# Patient Record
Sex: Male | Born: 1975
Health system: Southern US, Community
[De-identification: ages and names within clinical notes are randomized; demographics above are authoritative.]

## PROBLEM LIST (undated history)

## (undated) DIAGNOSIS — F419 Anxiety disorder, unspecified: Secondary | ICD-10-CM

## (undated) DIAGNOSIS — F4310 Post-traumatic stress disorder, unspecified: Secondary | ICD-10-CM

## (undated) DIAGNOSIS — F319 Bipolar disorder, unspecified: Secondary | ICD-10-CM

## (undated) DIAGNOSIS — F1020 Alcohol dependence, uncomplicated: Secondary | ICD-10-CM

## (undated) DIAGNOSIS — R443 Hallucinations, unspecified: Secondary | ICD-10-CM

## (undated) DIAGNOSIS — F329 Major depressive disorder, single episode, unspecified: Secondary | ICD-10-CM

## (undated) DIAGNOSIS — F32A Depression, unspecified: Secondary | ICD-10-CM

## (undated) DIAGNOSIS — F200 Paranoid schizophrenia: Secondary | ICD-10-CM

## (undated) DIAGNOSIS — T50905A Adverse effect of unspecified drugs, medicaments and biological substances, initial encounter: Secondary | ICD-10-CM

## (undated) DIAGNOSIS — R569 Unspecified convulsions: Secondary | ICD-10-CM

## (undated) HISTORY — DX: Bipolar disorder, unspecified: F31.9

## (undated) HISTORY — DX: Post-traumatic stress disorder, unspecified: F43.10

## (undated) HISTORY — DX: Hallucinations, unspecified: R44.3

---

## 2001-03-20 HISTORY — PX: KIDNEY DONATION: SHX685

## 2008-09-21 ENCOUNTER — Emergency Department (HOSPITAL_COMMUNITY): Admission: EM | Admit: 2008-09-21 | Discharge: 2008-09-21 | Payer: Self-pay | Admitting: Emergency Medicine

## 2010-02-25 MED ORDER — PENICILLIN V-K 500 MG TAB
500 mg | ORAL_TABLET | Freq: Four times a day (QID) | ORAL | Status: AC
Start: 2010-02-25 — End: 2010-03-04

## 2010-02-25 MED ORDER — HYDROCODONE-ACETAMINOPHEN 7.5 MG-325 MG TAB
ORAL | Status: AC
Start: 2010-02-25 — End: 2010-02-25
  Administered 2010-02-25: 13:00:00 via ORAL

## 2010-02-25 MED ORDER — HYDROCODONE-ACETAMINOPHEN 7.5 MG-500 MG TAB
ORAL_TABLET | ORAL | Status: DC | PRN
Start: 2010-02-25 — End: 2010-08-20

## 2010-02-25 MED FILL — HYDROCODONE-ACETAMINOPHEN 7.5 MG-325 MG TAB: ORAL | Qty: 1

## 2010-02-25 NOTE — ED Notes (Signed)
I have reviewed discharge instructions with the patient.  The patient verbalized understanding.

## 2010-02-25 NOTE — ED Provider Notes (Signed)
Patient is a 34 y.o. male presenting with dental problem. The history is provided by the patient.   Dental Pain   This is a recurrent problem. The current episode started 2 days ago. The problem occurs constantly. The problem has been gradually worsening. The pain is located in the right lower mouth.The quality of the pain is aching and sharp.  The pain is at a severity of 10/10. Associated symptoms include nausea and gum redness.There was no vomiting, no fever, no swelling, no chest pain, no shortness of breath, no headaches and no drainage. He has tried aspirin for the symptoms. The treatment provided no relief. The patient has no cardiac history.       No past medical history on file.     No past surgical history on file.      No family history on file.     History   Social History   ??? Marital Status: Married     Spouse Name: N/A     Number of Children: N/A   ??? Years of Education: N/A   Occupational History   ??? Not on file.   Social History Main Topics   ??? Smoking status: Current Everyday Smoker -- 1.5 packs/day   ??? Smokeless tobacco: Not on file   ??? Alcohol Use: No   ??? Drug Use: No   ??? Sexually Active:    Other Topics Concern   ??? Not on file   Social History Narrative   ??? No narrative on file                    ALLERGIES: Review of patient's allergies indicates no known allergies.      Review of Systems   Constitutional: Negative for fever and chills.   HENT: Positive for dental problem. Negative for sore throat, trouble swallowing, neck pain and neck stiffness.    All other systems reviewed and are negative.        Filed Vitals:    02/25/10 0805   BP: 146/108   Pulse: 85   Temp: 97.8 ??F (36.6 ??C)   Resp: 18   Height: 5\' 9"  (1.753 m)   Weight: 185 lb (83.915 kg)   SpO2: 99%              Physical Exam   Nursing note and vitals reviewed.  Constitutional: He is oriented to person, place, and time. He appears well-developed and well-nourished. He appears distressed (mild).   HENT:    Head: Normocephalic and atraumatic. No trismus in the jaw.   Right Ear: External ear normal.   Left Ear: External ear normal.   Mouth/Throat: Uvula is midline, oropharynx is clear and moist and mucous membranes are normal. He does not have dentures. No oral lesions. Abnormal dentition. Dental caries present. No dental abscesses, uvula swelling or lacerations.   Eyes: Conjunctivae and EOM are normal. Pupils are equal, round, and reactive to light.   Neck: Normal range of motion. Neck supple.   Cardiovascular: Normal rate, regular rhythm and normal heart sounds.    No murmur heard.  Pulmonary/Chest: Effort normal and breath sounds normal. No respiratory distress.   Lymphadenopathy:     He has no cervical adenopathy.   Neurological: He is alert and oriented to person, place, and time.   Skin: He is not diaphoretic.        MDM    Procedures

## 2010-02-25 NOTE — ED Notes (Signed)
Pt here with lower right sided dental abscess, states he has a dentist appt on tuesday

## 2010-08-20 MED ORDER — TRAMADOL 50 MG TAB
50 mg | ORAL_TABLET | Freq: Four times a day (QID) | ORAL | Status: DC | PRN
Start: 2010-08-20 — End: 2012-05-26

## 2010-08-20 MED ORDER — LIDOCAINE HCL 1 % (10 MG/ML) IJ SOLN
10 mg/mL (1 %) | INTRAMUSCULAR | Status: DC
Start: 2010-08-20 — End: 2010-08-20
  Administered 2010-08-20: 18:00:00 via INTRAMUSCULAR

## 2010-08-20 MED ORDER — DIPHTH,PERTUS(ACEL)TETANUS VAC(PF) 2 LF-(5-3-5MCG)-5 LF/0.5 ML IM SUSP
2 Lf-(.5-5-3-5 mcg)-5Lf/0.5 mL | INTRAMUSCULAR | Status: AC
Start: 2010-08-20 — End: 2010-08-20
  Administered 2010-08-20: 18:00:00 via INTRAMUSCULAR

## 2010-08-20 MED ORDER — LIDOCAINE 2 % MUCOSAL GEL
2 % | Status: AC
Start: 2010-08-20 — End: 2010-08-20
  Administered 2010-08-20: 19:00:00 via TOPICAL

## 2010-08-20 MED ORDER — MUPIROCIN 2 % OINTMENT
2 % | Freq: Three times a day (TID) | CUTANEOUS | Status: DC
Start: 2010-08-20 — End: 2012-05-26

## 2010-08-20 MED ORDER — TRIMETHOPRIM-SULFAMETHOXAZOLE 160 MG-800 MG TAB
160-800 mg | ORAL_TABLET | Freq: Two times a day (BID) | ORAL | Status: AC
Start: 2010-08-20 — End: 2010-08-30

## 2010-08-20 NOTE — ED Notes (Signed)
Site cleaned, dressing placed

## 2010-08-20 NOTE — ED Provider Notes (Signed)
HPI Comments: Ethan Sampson states he was working on his deck and fell off, accidentally lacerating his left elbow/arm 3 days ago. He just wrapped it up and decided today to come be seen. He noticed it was hurting more and is red and painful.    Patient is a 35 y.o. male presenting with skin laceration. The history is provided by the patient.   Laceration   Incident onset: 3 days now. The laceration is located on the left arm. The laceration is 2 cm in size. Injury mechanism: fall off his porch. The pain is at a severity of 7/10. The pain is moderate. The pain has been constant since onset. Associated symptoms include discoloration. Pertinent negatives include no numbness, no tingling, no weakness, no loss of motion and no coolness. The patient's last tetanus shot was 5 to 10 years ago.       No past medical history on file.     Past Surgical History   Procedure Date   ??? Hx urological      R nephrectomy         No family history on file.     History     Social History   ??? Marital Status: Married     Spouse Name: N/A     Number of Children: N/A   ??? Years of Education: N/A     Occupational History   ??? Not on file.     Social History Main Topics   ??? Smoking status: Current Everyday Smoker -- 1.5 packs/day   ??? Smokeless tobacco: Not on file   ??? Alcohol Use: Yes   ??? Drug Use: No   ??? Sexually Active:      Other Topics Concern   ??? Not on file     Social History Narrative   ??? No narrative on file                  ALLERGIES: Pcn      Review of Systems   Constitutional: Negative.    HENT: Negative.    Eyes: Negative.    Respiratory: Negative.    Cardiovascular: Negative.    Gastrointestinal: Negative.    Genitourinary: Negative.    Musculoskeletal: Negative.    Skin: Positive for wound.   Neurological: Negative.  Negative for tingling, weakness and numbness.   Hematological: Negative.    Psychiatric/Behavioral: Negative.    [all other systems reviewed and are negative        Filed Vitals:    08/20/10 1329   BP: 127/80   Pulse: 87    Temp: 97.9 ??F (36.6 ??C)   Resp: 16   Height: 5\' 9"  (1.753 m)   Weight: 90.719 kg (200 lb)   SpO2: 98%            Physical Exam   [nursing notereviewed.  Constitutional: He is oriented to person, place, and time. He appears well-developed and well-nourished.   HENT:   Head: Normocephalic and atraumatic.   Right Ear: External ear normal.   Left Ear: External ear normal.   Nose: Nose normal.   Mouth/Throat: Oropharynx is clear and moist.   Eyes: Conjunctivae and EOM are normal. Pupils are equal, round, and reactive to light.   Neck: Normal range of motion. Neck supple.   Cardiovascular: Normal rate, regular rhythm, normal heart sounds and intact distal pulses.    Pulmonary/Chest: Effort normal and breath sounds normal.   Abdominal: Soft. Bowel sounds are normal.   Musculoskeletal: Normal  range of motion.   Neurological: He is alert and oriented to person, place, and time. He has normal reflexes.   Skin: Skin is warm and dry. There is erythema.        Psychiatric: He has a normal mood and affect. His behavior is normal. Judgment and thought content normal.        MDM    Procedures    The patient was observed in the ED.  Adacel and Rocephin 1 gm IM here. Sterile dressing and ace wrap applied. ED if any change or worse. Medication as directed. Ethan Sampson counseled on quitting smoking. All questions answered.  I discussed the results of all labs, procedures, radiographs, and treatments with the patient and available family.  Treatment plan is agreed upon and the patient is ready for discharge.  All voiced understanding of the discharge plan and medication instructions or changes as appropriate.  Questions about treatment in the ED were answered.  All were encouraged to return should symptoms worsen or new problems develop.

## 2010-08-20 NOTE — ED Notes (Signed)
I have reviewed discharge instructions with the patient.  The patient verbalized understanding. 3 prescriptions given to pt. Pt ambulatory. Pt not in any distress.

## 2011-06-04 NOTE — ED Notes (Signed)
Patient advises that he has been having congestion and cough for app 6 days. Patient advises that he has been taking theraflu without any help. Patient also complaining of headache.

## 2011-06-05 MED ORDER — FLUTICASONE 50 MCG/ACTUATION NASAL SPRAY, SUSP
50 mcg/actuation | Freq: Every day | NASAL | Status: DC
Start: 2011-06-05 — End: 2012-05-26

## 2011-06-05 MED ORDER — CETIRIZINE 10 MG TAB
10 mg | ORAL_TABLET | Freq: Every day | ORAL | Status: AC
Start: 2011-06-05 — End: 2011-06-20

## 2011-06-05 MED ORDER — CEPHALEXIN 500 MG CAP
500 mg | ORAL_CAPSULE | Freq: Four times a day (QID) | ORAL | Status: AC
Start: 2011-06-05 — End: 2011-06-15

## 2011-06-05 NOTE — ED Provider Notes (Signed)
HPI Comments: 36 yo male c/o cough productive of sputum, sinus congestion and HA x 1.5 weeks. Patient states she has had chills and sweats. Patient denies F/N/V/D/C. Patient states he has taken theraflu without relief.       Pmhx:   Shx: 1 ppd, occ etoh     Patient is a 36 y.o. male presenting with sinus problems. The history is provided by the patient.   Sinus Infection   This is a new problem. The current episode started more than 1 week ago. The problem has not changed since onset.There has been no fever. The pain is mild. The pain has been intermittent since onset. Associated symptoms include congestion and sinus pressure. Pertinent negatives include no chills, no ear pain, no hoarse voice, no sore throat, no swollen glands, no cough, no shortness of breath, no rhinorrhea, no neck pain, no neck pain and no chest pain. Treatments tried: theraflu. The treatment provided no relief.        Past Medical History   Diagnosis Date   ??? Other ill-defined conditions      back problems        Past Surgical History   Procedure Date   ??? Hx urological      R nephrectomy   ??? Hx appendectomy    ??? Hx orthopaedic      toe amputated on right foot 2 nd digit         No family history on file.     History     Social History   ??? Marital Status: MARRIED     Spouse Name: N/A     Number of Children: N/A   ??? Years of Education: N/A     Occupational History   ??? Not on file.     Social History Main Topics   ??? Smoking status: Current Everyday Smoker -- 1.0 packs/day   ??? Smokeless tobacco: Not on file   ??? Alcohol Use: Yes   ??? Drug Use: No   ??? Sexually Active:      Other Topics Concern   ??? Not on file     Social History Narrative   ??? No narrative on file                  ALLERGIES: Pcn      Review of Systems   Constitutional: Negative for fever, chills, diaphoresis, activity change, appetite change, fatigue and unexpected weight change.   HENT: Positive for congestion and sinus pressure. Negative for hearing loss, ear pain, sore throat, hoarse  voice, facial swelling, rhinorrhea, sneezing, trouble swallowing, neck pain, neck stiffness, dental problem, postnasal drip and ear discharge.    Respiratory: Negative for apnea, cough, choking, chest tightness, shortness of breath, wheezing and stridor.    Cardiovascular: Negative for chest pain, palpitations and leg swelling.   Gastrointestinal: Negative for nausea, vomiting, diarrhea and constipation.   Skin: Negative for color change and pallor.   Psychiatric/Behavioral: Negative for behavioral problems, confusion and agitation.       Filed Vitals:    06/04/11 2326   BP: 120/73   Pulse: 76   Temp: 98.2 ??F (36.8 ??C)   Resp: 16   Height: 5\' 9"  (1.753 m)   Weight: 79.833 kg (176 lb)   SpO2: 95%            Physical Exam   Nursing note and vitals reviewed.  Constitutional: He is oriented to person, place, and time. Vital signs are normal. He  appears well-developed and well-nourished.  Non-toxic appearance. He does not have a sickly appearance. He does not appear ill. No distress.   HENT:   Head: Normocephalic and atraumatic. No trismus in the jaw.   Right Ear: Hearing, tympanic membrane, external ear and ear canal normal.   Left Ear: Hearing, tympanic membrane, external ear and ear canal normal.   Nose: Mucosal edema present. Right sinus exhibits frontal sinus tenderness. Right sinus exhibits no maxillary sinus tenderness. Left sinus exhibits frontal sinus tenderness. Left sinus exhibits no maxillary sinus tenderness.   Mouth/Throat: Uvula is midline, oropharynx is clear and moist and mucous membranes are normal. He does not have dentures. No oral lesions. Normal dentition. No dental abscesses, uvula swelling, lacerations or dental caries.   Eyes: Conjunctivae and EOM are normal. Pupils are equal, round, and reactive to light.   Neck: Normal range of motion. Neck supple.   Cardiovascular: Normal rate, regular rhythm, S1 normal, S2 normal and normal heart sounds.  Exam reveals no gallop.    No murmur heard.   Pulmonary/Chest: Effort normal and breath sounds normal. No respiratory distress. He has no decreased breath sounds. He has no wheezes. He has no rhonchi. He has no rales.   Neurological: He is alert and oriented to person, place, and time.   Skin: Skin is warm, dry and intact. No abrasion, no bruising, no burn, no ecchymosis, no laceration, no lesion and no rash noted. He is not diaphoretic. No erythema. No pallor.   Psychiatric: He has a normal mood and affect. His speech is normal and behavior is normal. Judgment and thought content normal.        MDM     Risk of Significant Complications, Morbidity, and/or Mortality:   Presenting problems:  Low  Diagnostic procedures:  Low  Management options:  Low      Procedures    I have discussed the results of labs, procedures, radiographs, treatments as well as any previous results found within the [facility]. [employer] with the patient and available family.?? A treatment plan was developed in conjunction with the patient and was agreed upon. The patient is ready for discharge at this time.?? All voiced understanding of the discharge plan and medication instructions or changes as appropriate.?? Questions about treatment in the ED were answered.?? The patient was encouraged to return should symptoms worsen or new problems develop. A follow up physician was provided to the patient on the discharge papers.

## 2011-06-05 NOTE — ED Notes (Signed)
Rx x 4  Patient is wtihout distress received  Work excuse for a day  Followup as needed ambulatory out of the ER

## 2011-06-05 NOTE — ED Notes (Signed)
Patient is complaining with sinus infections and pressure with headache

## 2011-06-05 NOTE — ED Provider Notes (Signed)
I was physically present and available for consultation during this encounter.

## 2012-05-26 NOTE — ED Notes (Signed)
I have reviewed discharge instructions with the patient.  The patient verbalized understanding.

## 2012-05-26 NOTE — ED Provider Notes (Signed)
HPI Comments: Patient is here with an abscess on his right gluteal area that started a couple of days ago. It is very painful and it is getting worse. 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     Patient is a 37 y.o. male presenting with abscess. The history is provided by the patient.   Abscess   This is a new problem. Episode onset: 3 or 4 days now. Patient reports a subjective fever - was not measured.Affected Location: right gluteal area. The pain is at a severity of 9/10. The pain is severe. Associated symptoms include pain. He has tried nothing for the symptoms.        Past Medical History   Diagnosis Date   ??? Other ill-defined conditions      back problems        Past Surgical History   Procedure Laterality Date   ??? Hx urological       R nephrectomy   ??? Hx appendectomy     ??? Hx orthopaedic       toe amputated on right foot 2 nd digit         History reviewed. No pertinent family history.     History     Social History   ??? Marital Status: MARRIED     Spouse Name: N/A     Number of Children: N/A   ??? Years of Education: N/A     Occupational History   ??? Not on file.     Social History Main Topics   ??? Smoking status: Current Every Day Smoker -- 1.00 packs/day   ??? Smokeless tobacco: Not on file   ??? Alcohol Use: Yes   ??? Drug Use: No   ??? Sexually Active: Not on file     Other Topics Concern   ??? Not on file     Social History Narrative   ??? No narrative on file                  ALLERGIES: Pcn      Review of Systems   Constitutional: Negative.    HENT: Negative.    Eyes: Negative.    Respiratory: Negative.    Cardiovascular: Negative.    Gastrointestinal: Negative.    Genitourinary: Negative.    Musculoskeletal: Negative.    Skin: Positive for color change and wound.   Neurological: Negative.    Psychiatric/Behavioral: Negative.    All other systems reviewed and are negative.        Filed Vitals:    05/26/12 2023   BP: 108/56   Pulse: 84   Temp: 99.2 ??F (37.3 ??C)   Resp: 16   Height: 5\' 9"  (1.753 m)    Weight: 83.915 kg (185 lb)   SpO2: 100%            Physical Exam   Nursing note and vitals reviewed.  Constitutional: He is oriented to person, place, and time. He appears well-developed and well-nourished.   HENT:   Head: Normocephalic and atraumatic.   Right Ear: External ear normal.   Left Ear: External ear normal.   Nose: Nose normal.   Mouth/Throat: Oropharynx is clear and moist.   Eyes: Conjunctivae and EOM are normal. Pupils are equal, round, and reactive to light.   Neck: Normal range of motion. Neck supple.   Cardiovascular: Normal rate, regular rhythm, normal heart sounds and intact distal pulses.    Pulmonary/Chest: Effort normal and breath  sounds normal.   Abdominal: Soft. Bowel sounds are normal.   Musculoskeletal: Normal range of motion.        Back:    Neurological: He is alert and oriented to person, place, and time. He has normal reflexes.   Skin: Skin is warm and dry.   Psychiatric: He has a normal mood and affect. His behavior is normal. Judgment and thought content normal.        MDM    I&D Abcess Simple  Consent: Verbal consent obtained.  Consent given by: patient  Date/Time: 05/26/2012 8:58 PM  Performed by: PAPreparation: skin prepped with Betadine  Pre-procedure re-eval: Immediately prior to the procedure, the patient was reevaluated and found suitable for the planned procedure and any planned medications.  Time out: Immediately prior to the procedure a time out was called to verify the correct patient, procedure, equipment, staff and marking as appropriate..  Location details: right buttocks  Anesthesia: local infiltration  Local anesthetic: lidocaine 1% without epinephrine  Anesthetic total: 10 ml  Scalpel size: 11  Needle gauge: 30.  Incision type: single straight  Complexity: simple  Drainage: purulent  Drainage amount: moderate  Wound treatment: wound left open  Packing material: 1/4 in iodoform gauze  Post-procedure: dressing applied  Patient tolerance: Patient tolerated the procedure  well with no immediate complications.  My total time at bedside, performing this procedure was 1-15 minutes.        The patient was observed in the ED. Patient instructed on wound care and will follow up in 2 days for packing removal.     I discussed the results of all labs, procedures, radiographs, and treatments with the patient and available family.  Treatment plan is agreed upon and the patient is ready for discharge.  All voiced understanding of the discharge plan and medication instructions or changes as appropriate.  Questions about treatment in the ED were answered.  All were encouraged to return should symptoms worsen or new problems develop.

## 2012-05-26 NOTE — ED Provider Notes (Signed)
I was personally available for consultation in the emergency department.  I have reviewed the chart and agree with the documentation recorded by the MLP, including the assessment, treatment plan, and disposition.  Jen Eppinger STUART Francely Craw, MD

## 2012-05-26 NOTE — ED Notes (Signed)
Boil on right buttocks.

## 2012-05-27 MED ADMIN — HYDROcodone-acetaminophen (NORCO) 5-325 mg per tablet 1 Tab: ORAL | @ 01:00:00 | NDC 00406036523

## 2012-05-27 MED ADMIN — ketorolac (TORADOL) tablet 10 mg: ORAL | @ 01:00:00 | NDC 00378113401

## 2012-06-02 NOTE — ED Notes (Signed)
Patient given sandwich per MD.     Hinda Lenis, RN

## 2012-06-02 NOTE — ED Notes (Signed)
FSBS 122mg /dl.

## 2012-06-02 NOTE — ED Notes (Signed)
Pt. States is in treatment center for alcohol. No alcohol x 16 days. States was drinking 1/2 gallon of vodka. In place of vodka, now eating chocolate at night.  States 'was feeling really bad, have a bad headache and realized I hadn't had any chocolate today'. Pt. Took FSBS before chocolate, 'my ears were ringing', and BS was '47'. Ate chocolate and 20 min. Later rechecked and was '64'. Then ate 4 packs of sugar, checked it 15 min. Later and was '120'.  Rechecked it an hour later and 'it was back to 54'. Pt. Also c/o boil on buttocks. Lost script for antibiotic.

## 2012-06-02 NOTE — ED Notes (Signed)
I have reviewed discharge instructions with the patient.  The patient verbalized understanding. Patient ambulatory to lobby in no acute distress.    Jack Bolio M Vaughn, RN

## 2012-06-02 NOTE — ED Provider Notes (Addendum)
HPI Comments: Checked fsbg on a whim, for fear his sugar would be high,   It was low  See RN note for rest of details  Pt worried he has D.M., as it runs in his family.  .  Also c/o right gluteal pain near recent I&D for which he did NOT get his abx filled.  No drainage      Patient is a 37 y.o. male presenting with hypoglycemia. The history is provided by the patient.   Low Blood Sugar   This is a new problem. The current episode started 3 to 5 hours ago. The problem has been rapidly worsening. Pertinent negatives include no confusion, no seizures, no unresponsiveness, no agitation, no delusions and no hallucinations. Mental status baseline is normal.  His past medical history does not include seizures.        Past Medical History   Diagnosis Date   ??? Other ill-defined conditions      back problems        Past Surgical History   Procedure Laterality Date   ??? Hx urological       R nephrectomy   ??? Hx appendectomy     ??? Hx orthopaedic       toe amputated on right foot 2 nd digit         No family history on file.     History     Social History   ??? Marital Status: MARRIED     Spouse Name: N/A     Number of Children: N/A   ??? Years of Education: N/A     Occupational History   ??? Not on file.     Social History Main Topics   ??? Smoking status: Current Every Day Smoker -- 1.00 packs/day   ??? Smokeless tobacco: Not on file   ??? Alcohol Use: Yes   ??? Drug Use: No   ??? Sexually Active: Not on file     Other Topics Concern   ??? Not on file     Social History Narrative   ??? No narrative on file                  ALLERGIES: Pcn      Review of Systems   Endocrine: Negative for polydipsia and polyphagia.   Genitourinary: Negative for dysuria and frequency.   Skin: Positive for wound. Negative for rash.   Neurological: Negative for seizures and syncope.   Psychiatric/Behavioral: Negative for hallucinations, confusion and agitation.       Filed Vitals:    06/02/12 2027 06/02/12 2241   BP: 137/68 124/70   Pulse: 80 78   Temp: 98.4 ??F (36.9  ??C) 97.9 ??F (36.6 ??C)   Resp: 18 16   Height: 5\' 9"  (1.753 m)    Weight: 88.905 kg (196 lb)    SpO2: 100% 100%            Physical Exam   Nursing note and vitals reviewed.  Constitutional: He is oriented to person, place, and time. He appears well-developed and well-nourished.   HENT:   Head: Normocephalic and atraumatic.   Eyes: Conjunctivae are normal. Pupils are equal, round, and reactive to light. Right eye exhibits no discharge. Left eye exhibits no discharge. No scleral icterus.   Neck: Normal range of motion. Neck supple.   Pulmonary/Chest: Effort normal. No respiratory distress.   Musculoskeletal: Normal range of motion. He exhibits no edema.   Neurological: He is alert and oriented to  person, place, and time. He exhibits normal muscle tone.   cni 2-12 grossly   Skin: Skin is warm and dry. He is not diaphoretic.   Right gluteus with 4cm x 5cm indurated area, no pus, or fluctuance,  Ultrasound exam reveals NO central cavity to drain   Psychiatric: He has a normal mood and affect. His behavior is normal.        MDM    Procedures

## 2012-06-03 LAB — METABOLIC PANEL, BASIC
Anion gap: 5 mmol/L — ABNORMAL LOW (ref 7–16)
BUN: 16 MG/DL (ref 6–23)
CO2: 27 mmol/L (ref 21–32)
Calcium: 9.2 MG/DL (ref 8.3–10.4)
Chloride: 102 mmol/L (ref 98–107)
Creatinine: 1.1 MG/DL (ref 0.8–1.5)
GFR est AA: >60 mL/min/{1.73_m2} (ref >60)
GFR est non-AA: >60 mL/min/{1.73_m2} (ref >60)
Glucose: 85 mg/dL (ref 65–100)
Potassium: 3.7 mmol/L (ref 3.5–5.1)
Sodium: 134 mmol/L — ABNORMAL LOW (ref 136–145)

## 2012-06-03 LAB — GLUCOSE, POC: Glucose (POC): 122 mg/dL — ABNORMAL HIGH (ref 65–100)

## 2012-06-03 MED ADMIN — trimethoprim-sulfamethoxazole (BACTRIM DS, SEPTRA DS) 160-800 mg per tablet 2 Tab: ORAL | @ 02:00:00 | NDC 68084023011

## 2012-08-01 LAB — CBC WITH AUTOMATED DIFF
ABS. BASOPHILS: 0 10*3/uL (ref 0.0–0.2)
ABS. EOSINOPHILS: 0.1 10*3/uL (ref 0.0–0.8)
ABS. IMM. GRANS.: 0 10*3/uL (ref 0.0–0.5)
ABS. LYMPHOCYTES: 2.9 10*3/uL (ref 0.5–4.6)
ABS. MONOCYTES: 1.7 10*3/uL — ABNORMAL HIGH (ref 0.1–1.3)
ABS. NEUTROPHILS: 7.4 10*3/uL (ref 1.7–8.2)
BASOPHILS: 0 % (ref 0.0–2.0)
EOSINOPHILS: 1 % (ref 0.5–7.8)
HCT: 38.4 % — ABNORMAL LOW (ref 41.1–50.3)
HGB: 13 g/dL — ABNORMAL LOW (ref 13.6–17.2)
IMMATURE GRANULOCYTES: 0.2 % (ref 0.0–5.0)
LYMPHOCYTES: 24 % (ref 13–44)
MCH: 33.7 PG — ABNORMAL HIGH (ref 26.1–32.9)
MCHC: 33.9 g/dL (ref 31.4–35.0)
MCV: 99.5 FL — ABNORMAL HIGH (ref 79.6–97.8)
MONOCYTES: 14 % — ABNORMAL HIGH (ref 4.0–12.0)
MPV: 9.6 FL — ABNORMAL LOW (ref 10.8–14.1)
NEUTROPHILS: 61 % (ref 43–78)
PLATELET: 363 10*3/uL (ref 150–450)
RBC: 3.86 M/uL — ABNORMAL LOW (ref 4.23–5.67)
RDW: 14.6 % (ref 11.9–14.6)
WBC: 12.3 10*3/uL — ABNORMAL HIGH (ref 4.3–11.1)

## 2012-08-01 LAB — EKG, 12 LEAD, INITIAL
Atrial Rate: 61 {beats}/min
Calculated P Axis: 78 degrees
Calculated R Axis: 88 degrees
Calculated T Axis: 88 degrees
P-R Interval: 124 ms
Q-T Interval: 408 ms
QRS Duration: 98 ms
QTC Calculation (Bezet): 410 ms
Ventricular Rate: 61 {beats}/min

## 2012-08-01 LAB — ETHYL ALCOHOL: ALCOHOL(ETHYL),SERUM: 5 MG/DL

## 2012-08-01 LAB — METABOLIC PANEL, BASIC
Anion gap: 7 mmol/L (ref 7–16)
BUN: 18 MG/DL (ref 6–23)
CO2: 26 mmol/L (ref 21–32)
Calcium: 8.5 MG/DL (ref 8.3–10.4)
Chloride: 102 mmol/L (ref 98–107)
Creatinine: 1.67 MG/DL — ABNORMAL HIGH (ref 0.8–1.5)
GFR est AA: >60 mL/min/{1.73_m2} (ref >60)
GFR est non-AA: 50 mL/min/{1.73_m2} — ABNORMAL LOW (ref >60)
Glucose: 136 mg/dL — ABNORMAL HIGH (ref 65–100)
Potassium: 3.5 mmol/L (ref 3.5–5.1)
Sodium: 135 mmol/L — ABNORMAL LOW (ref 136–145)

## 2012-08-01 LAB — DRUG SCREEN, URINE
AMPHETAMINES: POSITIVE
BARBITURATES: NEGATIVE
BENZODIAZEPINES: NEGATIVE
COCAINE: POSITIVE
METHADONE: NEGATIVE
OPIATES: NEGATIVE
PCP(PHENCYCLIDINE): NEGATIVE
THC (TH-CANNABINOL): POSITIVE

## 2012-08-01 LAB — URINE MICROSCOPIC
Crystals, urine: 0 /LPF
Mucus: 0 /lpf

## 2012-08-01 LAB — MAGNESIUM: Magnesium: 1.9 mg/dL (ref 1.8–2.4)

## 2012-08-01 MED ADMIN — LORazepam (ATIVAN) injection 2 mg: INTRAVENOUS | @ 06:00:00 | NDC 00641604401

## 2012-08-01 MED ADMIN — sodium chloride 0.9 % bolus infusion 1,000 mL: INTRAVENOUS | @ 06:00:00 | NDC 00409798309

## 2012-08-01 MED FILL — LORAZEPAM 2 MG/ML IJ SOLN: 2 mg/mL | INTRAMUSCULAR | Qty: 1

## 2012-08-01 NOTE — ED Notes (Signed)
Assumed care of patient at shift change. Patient history, physical, and results reviewed by myself. Independent exam performed.    Waiting pt to mentally clear.

## 2012-08-01 NOTE — ED Provider Notes (Addendum)
HPI Comments: Pt reports using multiple drugs over the past several days, including injecting Meth.    Patient is a 37 y.o. male presenting with Ingested Medication. The history is provided by the patient.   Drug Overdose  This is a new problem. The current episode started more than 2 days ago. The problem occurs constantly. The problem has not changed since onset.Pertinent negatives include no chest pain, no abdominal pain, no headaches and no shortness of breath. Nothing aggravates the symptoms. Nothing relieves the symptoms. He has tried nothing for the symptoms. The treatment provided no relief.        Past Medical History   Diagnosis Date   ??? Other ill-defined conditions      back problems        Past Surgical History   Procedure Laterality Date   ??? Hx urological       R nephrectomy   ??? Hx appendectomy     ??? Hx orthopaedic       toe amputated on right foot 2 nd digit         No family history on file.     History     Social History   ??? Marital Status: MARRIED     Spouse Name: N/A     Number of Children: N/A   ??? Years of Education: N/A     Occupational History   ??? Not on file.     Social History Main Topics   ??? Smoking status: Current Every Day Smoker -- 2.00 packs/day   ??? Smokeless tobacco: Not on file   ??? Alcohol Use: Yes      Comment: Socially   ??? Drug Use: Yes     Special: Marijuana, Methamphetamines, Cocaine, Sedatives/Hypnotics, Other   ??? Sexually Active: Not on file     Other Topics Concern   ??? Not on file     Social History Narrative   ??? No narrative on file                  ALLERGIES: Pcn      Review of Systems   Constitutional: Negative for fever and chills.   Respiratory: Negative for shortness of breath.    Cardiovascular: Negative for chest pain.   Gastrointestinal: Negative for abdominal pain.   Neurological: Negative for headaches.   Psychiatric/Behavioral: Positive for hallucinations.   All other systems reviewed and are negative.        Filed Vitals:    08/01/12 0158   BP: 135/76   Pulse: 82    Temp: 97.6 ??F (36.4 ??C)   Resp: 20   Height: 5\' 9"  (1.753 m)   Weight: 74.844 kg (165 lb)   SpO2: 100%            Physical Exam   Nursing note and vitals reviewed.  Constitutional: He is oriented to person, place, and time. He appears well-developed and well-nourished. No distress.   HENT:   Head: Normocephalic and atraumatic.   Eyes: Conjunctivae and EOM are normal. Pupils are equal, round, and reactive to light.   Neck: Normal range of motion. Neck supple.   Cardiovascular: Normal rate, regular rhythm and normal heart sounds.    Pulmonary/Chest: Effort normal and breath sounds normal.   Abdominal: Soft. Bowel sounds are normal. He exhibits no distension. There is no tenderness.   Musculoskeletal: Normal range of motion. He exhibits no edema and no tenderness.   Neurological: He is alert and oriented to person,  place, and time.   Skin: Skin is warm and dry.   Psychiatric: He has a normal mood and affect. His behavior is normal.        MDM     Amount and/or Complexity of Data Reviewed:   Clinical lab tests:  Ordered and reviewed   Independant visualization of image, tracing, or specimen:  Yes  Risk of Significant Complications, Morbidity, and/or Mortality:   Presenting problems:  Moderate  Diagnostic procedures:  Moderate  Management options:  Moderate  Progress:   Patient progress:  Stable      Procedures

## 2012-08-01 NOTE — ED Notes (Signed)
Patient presents to ED with c/c effects of a meth binge. Patient reports the binge as 5 days though states he has slept a small amount in that time. Patient states he has also been using crack cocaine, powder cocaine, marijuana and states he believes his meth was cut with GHB. Patient reports seeing "shadow people."

## 2012-08-01 NOTE — ED Notes (Signed)
Patient resting quietly no distress noted.

## 2012-08-01 NOTE — ED Notes (Signed)
I have reviewed medications, follow up provider options, and discharge instructions with the patient. The patient verbalized understanding. Copy of discharge information given to patient upon discharge. Prescription(s) given to patient. Patient discharged ambulatory in no distress.

## 2013-04-18 LAB — CBC WITH AUTOMATED DIFF
ABS. BASOPHILS: 0 10*3/uL (ref 0.0–0.2)
ABS. EOSINOPHILS: 0.1 10*3/uL (ref 0.0–0.8)
ABS. IMM. GRANS.: 0 10*3/uL (ref 0.0–0.5)
ABS. LYMPHOCYTES: 1.9 10*3/uL (ref 0.5–4.6)
ABS. MONOCYTES: 0.9 10*3/uL (ref 0.1–1.3)
ABS. NEUTROPHILS: 6.7 10*3/uL (ref 1.7–8.2)
BASOPHILS: 0 % (ref 0.0–2.0)
EOSINOPHILS: 1 % (ref 0.5–7.8)
HCT: 39.1 % — ABNORMAL LOW (ref 41.1–50.3)
HGB: 13.3 g/dL — ABNORMAL LOW (ref 13.6–17.2)
IMMATURE GRANULOCYTES: 0.2 % (ref 0.0–5.0)
LYMPHOCYTES: 19 % (ref 13–44)
MCH: 32.3 PG (ref 26.1–32.9)
MCHC: 34 g/dL (ref 31.4–35.0)
MCV: 94.9 FL (ref 79.6–97.8)
MONOCYTES: 10 % (ref 4.0–12.0)
MPV: 9.8 FL — ABNORMAL LOW (ref 10.8–14.1)
NEUTROPHILS: 70 % (ref 43–78)
PLATELET: 333 10*3/uL (ref 150–450)
RBC: 4.12 M/uL — ABNORMAL LOW (ref 4.23–5.67)
RDW: 14.2 % (ref 11.9–14.6)
WBC: 9.6 10*3/uL (ref 4.3–11.1)

## 2013-04-18 LAB — METABOLIC PANEL, COMPREHENSIVE
A-G Ratio: 0.8 — ABNORMAL LOW (ref 1.2–3.5)
ALT (SGPT): 47 U/L (ref 12–65)
AST (SGOT): 92 U/L — ABNORMAL HIGH (ref 15–37)
Albumin: 3.6 g/dL (ref 3.5–5.0)
Alk. phosphatase: 82 U/L (ref 50–136)
Anion gap: 10 mmol/L (ref 7–16)
BUN: 13 MG/DL (ref 6–23)
Bilirubin, total: 1.2 MG/DL — ABNORMAL HIGH (ref 0.2–1.1)
CO2: 24 mmol/L (ref 21–32)
Calcium: 8.8 MG/DL (ref 8.3–10.4)
Chloride: 100 mmol/L (ref 98–107)
Creatinine: 1.04 MG/DL (ref 0.8–1.5)
GFR est AA: >60 mL/min/{1.73_m2} (ref >60)
GFR est non-AA: >60 mL/min/{1.73_m2} (ref >60)
Globulin: 4.6 g/dL — ABNORMAL HIGH (ref 2.3–3.5)
Glucose: 83 mg/dL (ref 65–100)
Potassium: 3.6 mmol/L (ref 3.5–5.1)
Protein, total: 8.2 g/dL (ref 6.3–8.2)
Sodium: 134 mmol/L — ABNORMAL LOW (ref 136–145)

## 2013-04-18 LAB — DRUG SCREEN, URINE
AMPHETAMINES: POSITIVE
BARBITURATES: NEGATIVE
BENZODIAZEPINES: POSITIVE
COCAINE: POSITIVE
METHADONE: NEGATIVE
OPIATES: NEGATIVE
PCP(PHENCYCLIDINE): NEGATIVE
THC (TH-CANNABINOL): POSITIVE

## 2013-04-18 MED ORDER — LORAZEPAM 2 MG/ML IJ SOLN
2 mg/mL | INTRAMUSCULAR | Status: DC
Start: 2013-04-18 — End: 2013-04-18

## 2013-04-18 MED ORDER — SODIUM CHLORIDE 0.9% BOLUS IV
0.9 % | Freq: Once | INTRAVENOUS | Status: DC
Start: 2013-04-18 — End: 2013-04-18

## 2013-04-18 MED ORDER — LORAZEPAM 2 MG/ML IJ SOLN
2 mg/mL | INTRAMUSCULAR | Status: AC
Start: 2013-04-18 — End: 2013-04-18
  Administered 2013-04-18: 18:00:00 via INTRAMUSCULAR

## 2013-04-18 MED FILL — LORAZEPAM 2 MG/ML IJ SOLN: 2 mg/mL | INTRAMUSCULAR | Qty: 1

## 2013-04-18 NOTE — ED Notes (Signed)
Patient assessed and placed on monitor

## 2013-04-18 NOTE — ED Notes (Signed)
Patient resting in bed

## 2013-04-18 NOTE — ED Notes (Signed)
I have reviewed discharge instructions with the patient.  The patient verbalized understanding. Patient being discharged without prescriptions

## 2013-04-18 NOTE — ED Notes (Signed)
Labs sent

## 2013-04-18 NOTE — ED Notes (Signed)
Wants to detox from meth and cocaine.  Lat used meth 5 days ago and cocaine yesterday.  Reports drinks half gallon of vodka a day and has drank since yesterday.  [facility] and walked out because he didn't like the staff.
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
## 2013-05-18 ENCOUNTER — Encounter (HOSPITAL_COMMUNITY): Payer: Self-pay | Admitting: Emergency Medicine

## 2013-05-18 ENCOUNTER — Emergency Department (HOSPITAL_COMMUNITY)
Admission: EM | Admit: 2013-05-18 | Discharge: 2013-05-18 | Disposition: A | Discharge status: Home / Self Care | Payer: Self-pay | Attending: Emergency Medicine | Admitting: Emergency Medicine

## 2013-05-18 DIAGNOSIS — F1910 Other psychoactive substance abuse, uncomplicated: Secondary | ICD-10-CM | POA: Diagnosis present

## 2013-05-18 DIAGNOSIS — F10239 Alcohol dependence with withdrawal, unspecified: Secondary | ICD-10-CM

## 2013-05-18 DIAGNOSIS — F10939 Alcohol use, unspecified with withdrawal, unspecified: Secondary | ICD-10-CM

## 2013-05-18 DIAGNOSIS — F17200 Nicotine dependence, unspecified, uncomplicated: Secondary | ICD-10-CM | POA: Insufficient documentation

## 2013-05-18 DIAGNOSIS — F1010 Alcohol abuse, uncomplicated: Secondary | ICD-10-CM | POA: Diagnosis present

## 2013-05-18 DIAGNOSIS — Z880 Allergy status to penicillin: Secondary | ICD-10-CM | POA: Insufficient documentation

## 2013-05-18 DIAGNOSIS — F10229 Alcohol dependence with intoxication, unspecified: Secondary | ICD-10-CM | POA: Insufficient documentation

## 2013-05-18 DIAGNOSIS — F329 Major depressive disorder, single episode, unspecified: Secondary | ICD-10-CM | POA: Diagnosis present

## 2013-05-18 DIAGNOSIS — F32A Depression, unspecified: Secondary | ICD-10-CM

## 2013-05-18 HISTORY — DX: Anxiety disorder, unspecified: F41.9

## 2013-05-18 HISTORY — DX: Depression, unspecified: F32.A

## 2013-05-18 HISTORY — DX: Major depressive disorder, single episode, unspecified: F32.9

## 2013-05-18 LAB — RAPID URINE DRUG SCREEN, HOSP PERFORMED
AMPHETAMINES: NOT DETECTED
BARBITURATES: NOT DETECTED
Benzodiazepines: NOT DETECTED
Cocaine: POSITIVE — AB
OPIATES: NOT DETECTED
TETRAHYDROCANNABINOL: POSITIVE — AB

## 2013-05-18 LAB — CBC
HCT: 39.3 % (ref 39.0–52.0)
HEMOGLOBIN: 13.8 g/dL (ref 13.0–17.0)
MCH: 34 pg (ref 26.0–34.0)
MCHC: 35.1 g/dL (ref 30.0–36.0)
MCV: 96.8 fL (ref 78.0–100.0)
PLATELETS: 286 10*3/uL (ref 150–400)
RBC: 4.06 MIL/uL — AB (ref 4.22–5.81)
RDW: 13.7 % (ref 11.5–15.5)
WBC: 6.3 10*3/uL (ref 4.0–10.5)

## 2013-05-18 LAB — COMPREHENSIVE METABOLIC PANEL
ALT: 20 U/L (ref 0–53)
AST: 31 U/L (ref 0–37)
Albumin: 3.6 g/dL (ref 3.5–5.2)
Alkaline Phosphatase: 75 U/L (ref 39–117)
BUN: 9 mg/dL (ref 6–23)
CALCIUM: 8.8 mg/dL (ref 8.4–10.5)
CHLORIDE: 101 meq/L (ref 96–112)
CO2: 24 meq/L (ref 19–32)
Creatinine, Ser: 0.89 mg/dL (ref 0.50–1.35)
GFR calc Af Amer: >90 mL/min (ref >90)
GLUCOSE: 98 mg/dL (ref 70–99)
Potassium: 4.3 mEq/L (ref 3.7–5.3)
SODIUM: 140 meq/L (ref 137–147)
Total Bilirubin: <0.2 mg/dL — ABNORMAL LOW (ref 0.3–1.2)
Total Protein: 7.7 g/dL (ref 6.0–8.3)

## 2013-05-18 LAB — CBG MONITORING, ED: GLUCOSE-CAPILLARY: 98 mg/dL (ref 70–99)

## 2013-05-18 LAB — ETHANOL: Alcohol, Ethyl (B): 139 mg/dL — ABNORMAL HIGH (ref 0–11)

## 2013-05-18 LAB — SALICYLATE LEVEL: Salicylate Lvl: <2 mg/dL — ABNORMAL LOW (ref 2.8–20.0)

## 2013-05-18 LAB — ACETAMINOPHEN LEVEL: Acetaminophen (Tylenol), Serum: <15 ug/mL (ref 10–30)

## 2013-05-18 MED ORDER — CHLORDIAZEPOXIDE HCL 25 MG PO CAPS
25.0000 mg | ORAL_CAPSULE | Freq: Four times a day (QID) | ORAL | Status: DC
  Administered 2013-05-18: 25 mg via ORAL
  Filled 2013-05-18: qty 1

## 2013-05-18 MED ORDER — LORAZEPAM 1 MG PO TABS
0.0000 mg | ORAL_TABLET | Freq: Four times a day (QID) | ORAL | Status: DC
  Administered 2013-05-18: 2 mg via ORAL
  Filled 2013-05-18: qty 2

## 2013-05-18 MED ORDER — LOPERAMIDE HCL 2 MG PO CAPS: 2.0000 mg | ORAL_CAPSULE | ORAL | Status: DC | PRN

## 2013-05-18 MED ORDER — THIAMINE HCL 100 MG/ML IJ SOLN: 100.0000 mg | Freq: Every day | INTRAMUSCULAR | Status: DC

## 2013-05-18 MED ORDER — LORAZEPAM 1 MG PO TABS: 0.0000 mg | ORAL_TABLET | Freq: Two times a day (BID) | ORAL | Status: DC

## 2013-05-18 MED ORDER — CHLORDIAZEPOXIDE HCL 25 MG PO CAPS
25.0000 mg | ORAL_CAPSULE | Freq: Once | ORAL | Status: AC
  Administered 2013-05-18: 25 mg via ORAL
  Filled 2013-05-18: qty 1

## 2013-05-18 MED ORDER — ADULT MULTIVITAMIN W/MINERALS CH
1.0000 | ORAL_TABLET | Freq: Every day | ORAL | Status: DC
  Administered 2013-05-18: 1 via ORAL
  Filled 2013-05-18: qty 1

## 2013-05-18 MED ORDER — CHLORDIAZEPOXIDE HCL 25 MG PO CAPS: 25.0000 mg | ORAL_CAPSULE | Freq: Four times a day (QID) | ORAL | Status: DC | PRN

## 2013-05-18 MED ORDER — ONDANSETRON HCL 4 MG PO TABS
4.0000 mg | ORAL_TABLET | Freq: Three times a day (TID) | ORAL | Status: DC | PRN
  Administered 2013-05-18: 4 mg via ORAL
  Filled 2013-05-18: qty 1

## 2013-05-18 MED ORDER — CHLORDIAZEPOXIDE HCL 25 MG PO CAPS: 25.0000 mg | ORAL_CAPSULE | Freq: Three times a day (TID) | ORAL | Status: DC

## 2013-05-18 MED ORDER — CHLORDIAZEPOXIDE HCL 25 MG PO CAPS: 25.0000 mg | ORAL_CAPSULE | ORAL | Status: DC

## 2013-05-18 MED ORDER — ONDANSETRON 4 MG PO TBDP: 4.0000 mg | ORAL_TABLET | Freq: Four times a day (QID) | ORAL | Status: DC | PRN

## 2013-05-18 MED ORDER — ZOLPIDEM TARTRATE 5 MG PO TABS: 5.0000 mg | ORAL_TABLET | Freq: Every evening | ORAL | Status: DC | PRN

## 2013-05-18 MED ORDER — LORAZEPAM 1 MG PO TABS
2.0000 mg | ORAL_TABLET | Freq: Once | ORAL | Status: AC
  Administered 2013-05-18: 2 mg via ORAL
  Filled 2013-05-18: qty 2

## 2013-05-18 MED ORDER — CHLORDIAZEPOXIDE HCL 25 MG PO CAPS: 25.0000 mg | ORAL_CAPSULE | Freq: Every day | ORAL | Status: DC

## 2013-05-18 MED ORDER — VITAMIN B-1 100 MG PO TABS
100.0000 mg | ORAL_TABLET | Freq: Every day | ORAL | Status: DC
  Administered 2013-05-18: 100 mg via ORAL
  Filled 2013-05-18: qty 1

## 2013-05-18 MED ORDER — NICOTINE 21 MG/24HR TD PT24
21.0000 mg | MEDICATED_PATCH | Freq: Every day | TRANSDERMAL | Status: DC
  Administered 2013-05-18 (×2): 21 mg via TRANSDERMAL
  Filled 2013-05-18 (×2): qty 1

## 2013-05-18 MED ORDER — ONDANSETRON 8 MG PO TBDP
8.0000 mg | ORAL_TABLET | Freq: Once | ORAL | Status: AC
  Administered 2013-05-18: 8 mg via ORAL
  Filled 2013-05-18: qty 1

## 2013-05-18 MED ORDER — ALUM & MAG HYDROXIDE-SIMETH 200-200-20 MG/5ML PO SUSP: 30.0000 mL | ORAL | Status: DC | PRN

## 2013-05-18 MED ORDER — IBUPROFEN 200 MG PO TABS: 600.0000 mg | ORAL_TABLET | Freq: Three times a day (TID) | ORAL | Status: DC | PRN

## 2013-05-18 NOTE — ED Notes (Addendum)
Pt arrived to unit; no s/s of distress noted at this time. Pt states he spent time yesterday with his 38yo son for the first time in ten years and was not able to stop drinking during the whole visit. Pt states he was embarrassed and he also had no money to buy his son a meal. Pt states he has no money because he cannot stop drinking long enough to hold down a job. Pt denies SI/HI or A/V hallucinations. Pt with a pleasant affect and is cooperative. Pt requesting long-term treatment for detox.

## 2013-05-18 NOTE — ED Notes (Signed)
[facility] notified of transportation needed to [facility] in [place]. 

## 2013-05-18 NOTE — Progress Notes (Signed)
Placed follow-up calls to both [facility] and [facility]:  [facility]- spoke with Florentina AddisonKatie who stated she did not have referral and transferred this Clinical research associatewriter to [place], there was no answer.  Message was left awaiting call back. [facility]- per Joni ReiningNicole need an updated ETOH level before considering.  Will call [facility] ED with update.   Tomi BambergerMariya Timika Muench Disposition [facility]

## 2013-05-18 NOTE — ED Notes (Signed)
Report called to RidgelandNicole Minor at [facility] in [place]. Will notify [facility] of transportation needed.

## 2013-05-18 NOTE — ED Provider Notes (Signed)
CSN: [id number]     Arrival date & time 05/18/13  0105 History   First MD Initiated Contact with Patient 05/18/13 0109     Chief Complaint  Patient presents with  . Medical Clearance  . Alcohol Problem   HPI  History provided by the patient. Patient is a 38 year old male with history of anxiety, depression and alcohol abuse who presents with requests for help with alcohol detox. Patient states he has been trying to   detox from alcohol himself last 2 weeks but has not had any success. Patient normally drinks half a gallon of Vodka as well as 4 Loco every day. He has been trying to reduce the amount that he drinks but states that he begins to feel tremors and has dry heaving with nausea symptoms. His last drink today was up 4 Loco at 3 PM. Since that time he has had worsening tremors and nausea symptoms. He has not had any vomiting. Denies any lightheadedness or loss of consciousness. Denies any associated SI or HI. Denies any other drug use.     Past Medical History  Diagnosis Date  . Anxiety   . Depression    Past Surgical History  Procedure Laterality Date  . Kidney donation Right 2003   History reviewed. No pertinent family history. History  Substance Use Topics  . Smoking status: Current Every Day Smoker -- 2.00 packs/day    Types: Cigarettes  . Smokeless tobacco: Never Used  . Alcohol Use: 19.2 oz/week    20 Shots of liquor, 12 Cans of beer per week    Review of Systems  All other systems reviewed and are negative.      Allergies  Penicillins  Home Medications  No current outpatient prescriptions on file. BP 118/65  Pulse 82  Temp(Src) 98.4 F (36.9 C) (Oral)  Resp 16  Ht 5\' 9"  (1.753 m)  Wt 185 lb (83.915 kg)  BMI 27.31 kg/m2  SpO2 97% Physical Exam  Nursing note and vitals reviewed. Constitutional: He is oriented to person, place, and time. He appears well-developed and well-nourished. No distress.  HENT:  Head: Normocephalic and atraumatic.  Eyes:  Conjunctivae and EOM are normal. Pupils are equal, round, and reactive to light.  Right strabismus present  Cardiovascular: Normal rate and regular rhythm.   Pulmonary/Chest: Effort normal and breath sounds normal. No respiratory distress. He has no wheezes. He has no rales.  Abdominal: Soft. There is no tenderness. There is no rebound and no guarding.  Musculoskeletal: Normal range of motion.  Neurological: He is alert and oriented to person, place, and time. He displays tremor. No cranial nerve deficit or sensory deficit.  Skin: Skin is warm. No rash noted.  Psychiatric: He has a normal mood and affect. His behavior is normal.    ED Course  Procedures   DIAGNOSTIC STUDIES: Oxygen Saturation is 97% on room air.    COORDINATION OF CARE:  Nursing notes reviewed. Vital signs reviewed. Initial pt interview and examination performed.   1:43 AM-patient seen and evaluated. He appears well no acute distress. Does have slight trembling to the extremities. Discussed work up plan with pt at bedside, which includes medical clearance and TTS consult. Pt agrees with plan.  Psychiatric holding orders as well as CIWA protocol in place. TTS consult placed.  Results for orders placed during the hospital encounter of 05/18/13  ACETAMINOPHEN LEVEL      Result Value Ref Range   Acetaminophen (Tylenol), Serum <15.0  10 - 30 ug/mL  CBC      Result Value Ref Range   WBC 6.3  4.0 - 10.5 K/uL   RBC 4.06 (*) 4.22 - 5.81 MIL/uL   Hemoglobin 13.8  13.0 - 17.0 g/dL   HCT 16.1  09.6 - 04.5 %   MCV 96.8  78.0 - 100.0 fL   MCH 34.0  26.0 - 34.0 pg   MCHC 35.1  30.0 - 36.0 g/dL   RDW 40.9  81.1 - 91.4 %   Platelets 286  150 - 400 K/uL  COMPREHENSIVE METABOLIC PANEL      Result Value Ref Range   Sodium 140  137 - 147 mEq/L   Potassium 4.3  3.7 - 5.3 mEq/L   Chloride 101  96 - 112 mEq/L   CO2 24  19 - 32 mEq/L   Glucose, Bld 98  70 - 99 mg/dL   BUN 9  6 - 23 mg/dL   Creatinine, Ser 7.82  0.50 - 1.35  mg/dL   Calcium 8.8  8.4 - 95.6 mg/dL   Total Protein 7.7  6.0 - 8.3 g/dL   Albumin 3.6  3.5 - 5.2 g/dL   AST 31  0 - 37 U/L   ALT 20  0 - 53 U/L   Alkaline Phosphatase 75  39 - 117 U/L   Total Bilirubin <0.2 (*) 0.3 - 1.2 mg/dL   GFR calc non Af Amer >90  >90 mL/min   GFR calc Af Amer >90  >90 mL/min  ETHANOL      Result Value Ref Range   Alcohol, Ethyl (B) 139 (*) 0 - 11 mg/dL  SALICYLATE LEVEL      Result Value Ref Range   Salicylate Lvl <2.0 (*) 2.8 - 20.0 mg/dL  URINE RAPID DRUG SCREEN (HOSP PERFORMED)      Result Value Ref Range   Opiates NONE DETECTED  NONE DETECTED   Cocaine POSITIVE (*) NONE DETECTED   Benzodiazepines NONE DETECTED  NONE DETECTED   Amphetamines NONE DETECTED  NONE DETECTED   Tetrahydrocannabinol POSITIVE (*) NONE DETECTED   Barbiturates NONE DETECTED  NONE DETECTED  CBG MONITORING, ED      Result Value Ref Range   Glucose-Capillary 98  70 - 99 mg/dL      MDM   Final diagnoses:  Alcohol abuse  Alcohol withdrawal       Angus Seller, PA-C 05/18/13 0533
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
## 2013-05-18 NOTE — Progress Notes (Signed)
Pt accepted to [facility] by Joni ReiningNicole and [facility].
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
## 2013-05-18 NOTE — BH Assessment (Signed)
Assessment complete. Per Rosey BathKelly Southard, [facility], dual diagnosis unit is at capacity. Consulted with Maryjean Mornharles Kober, PA who agrees Pt meets criteria for alcohol detox. TTS will contact [facility], [facility] and other treatment centers for bed availability. Notified Ivonne AndrewPeter Dammen, PA-C of recommendation.  Harlin RainFord Ellis Ria CommentWarrick Jr, LPC, [facility] Triage Specialist

## 2013-05-18 NOTE — BH Assessment (Signed)
Received call for assessment. Spoke with Ivonne AndrewPeter Dammen, PA-C who says Pt is a heavy drinker and requesting alcohol detox. Pt appears to be experiencing alcohol withdrawal. Tele-assessment will be initiated.  Harlin RainFord Ellis Ria CommentWarrick Jr, LPC, [facility] Triage Specialist

## 2013-05-18 NOTE — ED Notes (Signed)
TTS being completed at this time. ?

## 2013-05-18 NOTE — ED Notes (Signed)
Pt reports he wants to detox from ETOH, pt states he usually drinks a half gallon of liquor a day for the past 2 years, last drink yesterday, pt drank a [employer] [place] at 1400 yesterday. Pt reports he has been having tremors, n/v, and anxiety and agitiation. Pt denies HI/SI/AVH. States he has depression and anxiety. Pt a&o x4, ambulatory to triage.

## 2013-05-18 NOTE — Progress Notes (Signed)
MHT initiated placement for detox by faxing referrals to [facility] and [facility].  Blain PaisMichelle L Ranson Belluomini, MHT/NS

## 2013-05-19 NOTE — ED Provider Notes (Signed)
Medical screening examination/treatment/procedure(s) were performed by non-physician practitioner and as supervising physician I was immediately available for consultation/collaboration.   EKG Interpretation None        Ileana Chalupa, MD 05/19/13 0702 

## 2014-06-25 ENCOUNTER — Encounter: Payer: Self-pay | Admitting: Internal Medicine

## 2014-06-25 ENCOUNTER — Ambulatory Visit: Payer: Self-pay | Attending: Internal Medicine | Admitting: Internal Medicine

## 2014-06-25 VITALS — BP 116/76 | HR 63 | Temp 98.0°F | Resp 16 | Wt 193.8 lb

## 2014-06-25 DIAGNOSIS — F17210 Nicotine dependence, cigarettes, uncomplicated: Secondary | ICD-10-CM | POA: Insufficient documentation

## 2014-06-25 DIAGNOSIS — F1011 Alcohol abuse, in remission: Secondary | ICD-10-CM

## 2014-06-25 DIAGNOSIS — F418 Other specified anxiety disorders: Secondary | ICD-10-CM

## 2014-06-25 DIAGNOSIS — F319 Bipolar disorder, unspecified: Secondary | ICD-10-CM | POA: Insufficient documentation

## 2014-06-25 DIAGNOSIS — F32A Depression, unspecified: Secondary | ICD-10-CM | POA: Insufficient documentation

## 2014-06-25 DIAGNOSIS — F1911 Other psychoactive substance abuse, in remission: Secondary | ICD-10-CM

## 2014-06-25 DIAGNOSIS — F419 Anxiety disorder, unspecified: Secondary | ICD-10-CM | POA: Insufficient documentation

## 2014-06-25 DIAGNOSIS — F329 Major depressive disorder, single episode, unspecified: Secondary | ICD-10-CM

## 2014-06-25 DIAGNOSIS — Z139 Encounter for screening, unspecified: Secondary | ICD-10-CM

## 2014-06-25 DIAGNOSIS — F1010 Alcohol abuse, uncomplicated: Secondary | ICD-10-CM

## 2014-06-25 DIAGNOSIS — F17200 Nicotine dependence, unspecified, uncomplicated: Secondary | ICD-10-CM | POA: Insufficient documentation

## 2014-06-25 DIAGNOSIS — Z833 Family history of diabetes mellitus: Secondary | ICD-10-CM | POA: Insufficient documentation

## 2014-06-25 DIAGNOSIS — Z8349 Family history of other endocrine, nutritional and metabolic diseases: Secondary | ICD-10-CM

## 2014-06-25 DIAGNOSIS — Z87898 Personal history of other specified conditions: Secondary | ICD-10-CM

## 2014-06-25 DIAGNOSIS — F4310 Post-traumatic stress disorder, unspecified: Secondary | ICD-10-CM | POA: Insufficient documentation

## 2014-06-25 DIAGNOSIS — Z720 Tobacco use: Secondary | ICD-10-CM

## 2014-06-25 LAB — CBC WITH DIFFERENTIAL/PLATELET
BASOS ABS: 0.1 10*3/uL (ref 0.0–0.1)
Basophils Relative: 1 % (ref 0–1)
EOS ABS: 0.5 10*3/uL (ref 0.0–0.7)
EOS PCT: 6 % — AB (ref 0–5)
HCT: 43.1 % (ref 39.0–52.0)
Hemoglobin: 14.6 g/dL (ref 13.0–17.0)
Lymphocytes Relative: 39 % (ref 12–46)
Lymphs Abs: 3.4 10*3/uL (ref 0.7–4.0)
MCH: 32.7 pg (ref 26.0–34.0)
MCHC: 33.9 g/dL (ref 30.0–36.0)
MCV: 96.4 fL (ref 78.0–100.0)
MONO ABS: 1.3 10*3/uL — AB (ref 0.1–1.0)
MPV: 10.1 fL (ref 8.6–12.4)
Monocytes Relative: 15 % — ABNORMAL HIGH (ref 3–12)
Neutro Abs: 3.4 10*3/uL (ref 1.7–7.7)
Neutrophils Relative %: 39 % — ABNORMAL LOW (ref 43–77)
PLATELETS: 332 10*3/uL (ref 150–400)
RBC: 4.47 MIL/uL (ref 4.22–5.81)
RDW: 12.7 % (ref 11.5–15.5)
WBC: 8.8 10*3/uL (ref 4.0–10.5)

## 2014-06-25 LAB — COMPLETE METABOLIC PANEL WITH GFR
ALT: 28 U/L (ref 0–53)
AST: 20 U/L (ref 0–37)
Albumin: 4.1 g/dL (ref 3.5–5.2)
Alkaline Phosphatase: 68 U/L (ref 39–117)
BILIRUBIN TOTAL: 0.2 mg/dL (ref 0.2–1.2)
BUN: 11 mg/dL (ref 6–23)
CO2: 25 meq/L (ref 19–32)
CREATININE: 0.88 mg/dL (ref 0.50–1.35)
Calcium: 9.7 mg/dL (ref 8.4–10.5)
Chloride: 101 mEq/L (ref 96–112)
GFR, Est African American: >89 mL/min
GFR, Est Non African American: >89 mL/min
Glucose, Bld: 84 mg/dL (ref 70–99)
Potassium: 5.3 mEq/L (ref 3.5–5.3)
Sodium: 136 mEq/L (ref 135–145)
TOTAL PROTEIN: 7.5 g/dL (ref 6.0–8.3)

## 2014-06-25 LAB — TSH: TSH: 1.503 u[IU]/mL (ref 0.350–4.500)

## 2014-06-25 MED ORDER — BUPROPION HCL ER (SR) 100 MG PO TB12: 100.0000 mg | ORAL_TABLET | Freq: Two times a day (BID) | ORAL | Status: DC

## 2014-06-25 NOTE — Progress Notes (Signed)
Patient Demographics  Jerry [name], is a 39 y.o. male  JXB:[id number]  ZHY:[id number]  DOB - [birth year]  CC:  Chief Complaint  Patient presents with  . Establish Care       HPI: Jerry [name] is a 40 y.o. male here today to establish medical care.patient has history of anxiety/depression, history of alcohol abuse, history of IV drug abuse, as per patient more than a month ago he was hospitalized in [place] at that time he was treated for pneumonia, he recently moved her to [place] and has seen another psychiatrist in [place] as per patient he was prescribed Seroquel but is not taking as per patient it makes him zombie ,patient is requesting referral to see a different psychiatrist currently denies any SI or HI, he also smokes cigarettes, have counseled patient to quit smoking, he's willing to try Wellbutrin, Patient has No headache, No chest pain, No abdominal pain - No Nausea, No new weakness tingling or numbness, No Cough - SOB.  Allergies  Allergen Reactions  . Penicillins Other (See Comments)    Unknown reaction as a child   Past Medical History  Diagnosis Date  . Anxiety   . Depression   . PTSD (post-traumatic stress disorder)   . Bipolar 1 disorder   . Hallucination    No current outpatient prescriptions on file prior to visit.   No current facility-administered medications on file prior to visit.   Family History  Problem Relation Age of Onset  . Heart disease Mother   . Cancer Mother   . Heart disease Father   . Diabetes Father   . Diabetes Maternal Grandmother   . Stroke Paternal Grandfather    History   Social History  . Marital Status: Single    Spouse Name: N/A  . Number of Children: N/A  . Years of Education: N/A   Occupational History  . Not on file.   Social History Main Topics  . Smoking status: Current Every Day Smoker -- 2.00 packs/day    Types: Cigarettes  . Smokeless tobacco: Never Used  . Alcohol Use: No     Comment:  last use  was 1 month ago   . Drug Use: Yes    Special: Marijuana, Cocaine, Heroin     Comment: history of IVDA  . Sexual Activity: Yes   Other Topics Concern  . Not on file   Social History Narrative    Review of Systems: Constitutional: Negative for fever, chills, diaphoresis, activity change, appetite change and fatigue. HENT: Negative for ear pain, nosebleeds, congestion, facial swelling, rhinorrhea, neck pain, neck stiffness and ear discharge.  Eyes: Negative for pain, discharge, redness, itching and visual disturbance. Respiratory: Negative for cough, choking, chest tightness, shortness of breath, wheezing and stridor.  Cardiovascular: Negative for chest pain, palpitations and leg swelling. Gastrointestinal: Negative for abdominal distention. Genitourinary: Negative for dysuria, urgency, frequency, hematuria, flank pain, decreased urine volume, difficulty urinating and dyspareunia.  Musculoskeletal: Negative for back pain, joint swelling, arthralgia and gait problem. Neurological: Negative for dizziness, tremors, seizures, syncope, facial asymmetry, speech difficulty, weakness, light-headedness, numbness and headaches.  Hematological: Negative for adenopathy. Does not bruise/bleed easily. Psychiatric/Behavioral: Negative for hallucinations, behavioral problems, confusion, dysphoric mood, decreased concentration and agitation.    Objective:   Filed Vitals:   06/25/14 1503  BP: 116/76  Pulse: 63  Temp: 98 F (36.7 C)  Resp: 16    Physical Exam: Constitutional: Patient appears well-developed and well-nourished. No distress. HENT: Normocephalic, atraumatic, External  right and left ear normal. Oropharynx is clear and moist.  Eyes: Conjunctivae and EOM are normal. PERRLA, no scleral icterus. Neck: Normal ROM. Neck supple. No JVD. No tracheal deviation. No thyromegaly. CVS: RRR, S1/S2 +, no murmurs, no gallops, no carotid bruit.  Pulmonary: Effort and breath sounds normal, no stridor,  rhonchi, wheezes, rales.  Abdominal: Soft. BS +, no distension, tenderness, rebound or guarding.  Musculoskeletal: Normal range of motion. No edema and no tenderness.  Neuro: Alert. Normal reflexes, muscle tone coordination. No cranial nerve deficit. Skin: Skin is warm and dry. No rash noted. Not diaphoretic. No erythema. No pallor. Psychiatric: Normal mood and affect. Behavior, judgment, thought content normal.  Lab Results  Component Value Date   WBC 6.3 05/18/2013   HGB 13.8 05/18/2013   HCT 39.3 05/18/2013   MCV 96.8 05/18/2013   PLT 286 05/18/2013   Lab Results  Component Value Date   CREATININE 0.89 05/18/2013   BUN 9 05/18/2013   NA 140 05/18/2013   K 4.3 05/18/2013   CL 101 05/18/2013   CO2 24 05/18/2013    No results found for: HGBA1C Lipid Panel  No results found for: CHOL, TRIG, HDL, CHOLHDL, VLDL, LDLCALC     Assessment and plan:   1. History of alcohol abuse As per patient now he is sober for more than a month  2. History of substance abuse/IV drug abuse  - Hepatitis B surface antibody; Future - Hepatitis B surface antigen; Future - Hepatitis C RNA quantitative - HIV antibody (with reflex)  3. Anxiety and depression Patient will try Wellbutrin which will also help him to quit smoking.  - buPROPion (WELLBUTRIN SR) 100 MG 12 hr tablet; Take 1 tablet (100 mg total) by mouth 2 (two) times daily.  Dispense: 60 tablet; Refill: 3 - Ambulatory referral to Psychiatry  4. Family history of diabetes mellitus (DM)  - Hemoglobin A1c  5. Family history of thyroid disease  - TSH  6. Tobacco abuse  - buPROPion (WELLBUTRIN SR) 100 MG 12 hr tablet; Take 1 tablet (100 mg total) by mouth 2 (two) times daily.  Dispense: 60 tablet; Refill: 3  7. Screening Ordered baseline blood work  - CBC with Differential/Platelet - COMPLETE METABOLIC PANEL WITH GFR - TSH - Vit D  25 hydroxy (rtn osteoporosis monitoring) - Hemoglobin A1c   Return in about 3 months  (around 09/24/2014), or if symptoms worsen or fail to improve.    The patient was given clear instructions to go to ER or return to medical center if symptoms don't improve, worsen or new problems develop. The patient verbalized understanding. The patient was told to call to get lab results if they haven't heard anything in the next week.    This note has been created with Education officer, environmentalDragon speech recognition software and smart phrase technology. Any transcriptional errors are unintentional.   Doris CheadleADVANI, Carvell Hoeffner, MD

## 2014-06-25 NOTE — Progress Notes (Signed)
Patient here to establish care Patient states he was an alcoholic for fifteen years Patient used to drink a half gallon of vodka a day Patient went through a rough divorce Patient states he suffers from depression, anxiety,bi polar ptsd Hallucinations. Patient did state he had suicidal thoughts in the past  But it has been a while since that happened Patient has been sober for a little over a month Patient was prescribe seroquel but not taking it due to his increased liver enzymes Patient needs something for his "shakes"

## 2014-06-26 ENCOUNTER — Telehealth: Payer: Self-pay | Admitting: *Deleted

## 2014-06-26 ENCOUNTER — Telehealth: Payer: Self-pay

## 2014-06-26 LAB — HEPATITIS C RNA QUANTITATIVE: HCV Quantitative: NOT DETECTED IU/mL (ref <15)

## 2014-06-26 LAB — HEMOGLOBIN A1C
Hgb A1c MFr Bld: 5.4 % (ref <5.7)
Mean Plasma Glucose: 108 mg/dL (ref <117)

## 2014-06-26 LAB — HIV ANTIBODY (ROUTINE TESTING W REFLEX): HIV 1&2 Ab, 4th Generation: NONREACTIVE

## 2014-06-26 LAB — VITAMIN D 25 HYDROXY (VIT D DEFICIENCY, FRACTURES): Vit D, 25-Hydroxy: 21 ng/mL — ABNORMAL LOW (ref 30–100)

## 2014-06-26 MED ORDER — VITAMIN D (ERGOCALCIFEROL) 1.25 MG (50000 UNIT) PO CAPS: 50000.0000 [IU] | ORAL_CAPSULE | ORAL | Status: DC

## 2014-06-26 NOTE — Telephone Encounter (Signed)
Gave patient results of labs and instructions on Vitamin D Rx.  Patient wrote down instructions and had no questions

## 2014-06-26 NOTE — Telephone Encounter (Signed)
-----   Message from Doris Cheadleeepak Advani, MD sent at 06/26/2014 11:11 AM EDT ----- Blood work reviewed, noticed low vitamin D, call patient advise to start ergocalciferol 50,000 units once a week for the duration of  12 weeks, then take OTC vitamin d 2000 units daily.

## 2014-06-26 NOTE — Telephone Encounter (Signed)
Patient not available Left message with his mom to have him return our call

## 2014-06-29 ENCOUNTER — Telehealth: Payer: Self-pay

## 2014-06-29 NOTE — Telephone Encounter (Signed)
Called patient at [phone](number supplied from his mom) Patient not available

## 2014-06-29 NOTE — Telephone Encounter (Signed)
-----   Message from Doris Cheadleeepak Advani, MD sent at 06/29/2014  9:24 AM EDT ----- Call and let the patient know that his blood test for hepatitis C is negative.

## 2014-06-29 NOTE — Telephone Encounter (Signed)
Patient is returning nurse's phone call, please f/u °

## 2014-06-29 NOTE — Telephone Encounter (Signed)
Pt returning call for nurse

## 2014-07-13 ENCOUNTER — Telehealth: Payer: Self-pay | Admitting: Clinical

## 2014-07-13 NOTE — Telephone Encounter (Signed)
First attempt to contact pt about referral

## 2014-07-14 ENCOUNTER — Telehealth: Payer: Self-pay | Admitting: Clinical

## 2014-07-14 NOTE — Telephone Encounter (Signed)
Pt will call appointment line and make [facility]

## 2019-10-06 ENCOUNTER — Encounter (HOSPITAL_COMMUNITY): Payer: Self-pay | Admitting: Emergency Medicine

## 2019-10-06 ENCOUNTER — Emergency Department (HOSPITAL_COMMUNITY): Payer: Self-pay

## 2019-10-06 ENCOUNTER — Other Ambulatory Visit: Payer: Self-pay

## 2019-10-06 ENCOUNTER — Emergency Department (HOSPITAL_COMMUNITY)
Admission: EM | Admit: 2019-10-06 | Discharge: 2019-10-06 | Disposition: A | Discharge status: Home / Self Care | Payer: Self-pay | Attending: Emergency Medicine | Admitting: Emergency Medicine

## 2019-10-06 DIAGNOSIS — F17210 Nicotine dependence, cigarettes, uncomplicated: Secondary | ICD-10-CM | POA: Insufficient documentation

## 2019-10-06 DIAGNOSIS — R059 Cough, unspecified: Secondary | ICD-10-CM

## 2019-10-06 DIAGNOSIS — F419 Anxiety disorder, unspecified: Secondary | ICD-10-CM | POA: Insufficient documentation

## 2019-10-06 DIAGNOSIS — Z20822 Contact with and (suspected) exposure to covid-19: Secondary | ICD-10-CM | POA: Insufficient documentation

## 2019-10-06 DIAGNOSIS — B349 Viral infection, unspecified: Secondary | ICD-10-CM | POA: Insufficient documentation

## 2019-10-06 LAB — CBC
HCT: 41.2 % (ref 39.0–52.0)
Hemoglobin: 13.6 g/dL (ref 13.0–17.0)
MCH: 31.9 pg (ref 26.0–34.0)
MCHC: 33 g/dL (ref 30.0–36.0)
MCV: 96.7 fL (ref 80.0–100.0)
Platelets: 237 10*3/uL (ref 150–400)
RBC: 4.26 MIL/uL (ref 4.22–5.81)
RDW: 12.9 % (ref 11.5–15.5)
WBC: 8 10*3/uL (ref 4.0–10.5)
nRBC: 0 % (ref 0.0–0.2)

## 2019-10-06 LAB — SARS CORONAVIRUS 2 BY RT PCR (HOSPITAL ORDER, PERFORMED IN ~~LOC~~ HOSPITAL LAB): SARS Coronavirus 2: NEGATIVE

## 2019-10-06 LAB — BASIC METABOLIC PANEL
Anion gap: 10 (ref 5–15)
BUN: 5 mg/dL — ABNORMAL LOW (ref 6–20)
CO2: 20 mmol/L — ABNORMAL LOW (ref 22–32)
Calcium: 9.1 mg/dL (ref 8.9–10.3)
Chloride: 104 mmol/L (ref 98–111)
Creatinine, Ser: 0.76 mg/dL (ref 0.61–1.24)
GFR calc Af Amer: >60 mL/min (ref >60)
GFR calc non Af Amer: >60 mL/min (ref >60)
Glucose, Bld: 100 mg/dL — ABNORMAL HIGH (ref 70–99)
Potassium: 4.1 mmol/L (ref 3.5–5.1)
Sodium: 134 mmol/L — ABNORMAL LOW (ref 135–145)

## 2019-10-06 LAB — URINALYSIS, ROUTINE W REFLEX MICROSCOPIC
Bilirubin Urine: NEGATIVE
Glucose, UA: NEGATIVE mg/dL
Hgb urine dipstick: NEGATIVE
Ketones, ur: NEGATIVE mg/dL
Leukocytes,Ua: NEGATIVE
Nitrite: NEGATIVE
Protein, ur: NEGATIVE mg/dL
Specific Gravity, Urine: 1.018 (ref 1.005–1.030)
pH: 7 (ref 5.0–8.0)

## 2019-10-06 MED ORDER — SODIUM CHLORIDE 0.9% FLUSH: 3.0000 mL | Freq: Once | INTRAVENOUS | Status: DC

## 2019-10-06 MED ORDER — HYDROXYZINE HCL 25 MG PO TABS
25.0000 mg | ORAL_TABLET | Freq: Once | ORAL | Status: AC
  Administered 2019-10-06: 25 mg via ORAL
  Filled 2019-10-06: qty 1

## 2019-10-06 NOTE — ED Notes (Signed)
Pt is wanting to leave AMA. Does not want to wait for Covid Test.

## 2019-10-06 NOTE — ED Triage Notes (Signed)
Pt. Stated, Jerry [name] had some chest pain, numbness and near fainting spells This has started around a month ago.

## 2019-10-06 NOTE — Discharge Instructions (Signed)
Your chest xray was concerning for possible pulmonary hypertension. Recommend following up with primary care physician for additional work up and treatment as needed.

## 2019-10-06 NOTE — ED Provider Notes (Signed)
I have personally seen and examined the patient. I have reviewed the documentation on PMH/FH/Soc Hx. I have discussed the plan of care with the resident and patient.  I have reviewed and agree with the resident's documentation. Please see associated encounter note.  Briefly, the patient is a 44 y.o. male here with multiple complaints.  Mostly concerned about cough.  Viral type symptoms.  Covid is negative.  Chest x-ray shows no infection.  Does show may be some evidence of some pulmonary hypertension.  However no respiratory distress.  No hypoxia.  No significant anemia, electrolyte abnormality, kidney injury.  Overall patient appears to likely have a viral process.  We will have him follow-up with primary care.  Discharged in good condition.   EKG Interpretation  Date/Time:  Monday October 06 2019 10:29:32 EDT Ventricular Rate:  61 PR Interval:  100 QRS Duration: 96 QT Interval:  386 QTC Calculation: 388 R Axis:   98 Text Interpretation: Sinus rhythm with short PR Right atrial enlargement Rightward axis Pulmonary disease pattern Minimal voltage criteria for LVH, may be normal variant ( Cornell product ) Abnormal ECG No old tracing to compare Confirmed by Meridee Score [phone]) on 10/06/2019 6:06:54 PM         Virgina Norfolk, DO 10/06/19 2153

## 2019-10-06 NOTE — ED Notes (Signed)
Patient verbalizes understanding of discharge instructions. Opportunity for questioning and answers were provided. Armband removed by staff, pt discharged from ED. Pt. ambulatory and discharged home.  

## 2019-10-06 NOTE — ED Provider Notes (Signed)
[facility] EMERGENCY DEPARTMENT Provider Note   CSN: [id number] Arrival date & time: 10/06/19  1017     History Chief Complaint  Patient presents with  . Chest Pain  . Numbness  . Near Syncope    Jerry [name] is a 44 y.o. male with anxiety, bipolar disorder, depression who presents with nausea, decreased appetite, anxiousness.  Patient endorses greater than 20 pound weight loss over the past year due to decreased appetite.  Patient states he treats his anxiety with marijuana use daily.  Patient endorses prior alcohol abuse however states he has not had a drink in 2 years.  Patient endorses subjective fever, congestion, cough, intermittent shortness of breath.  Patient denies having Covid vaccine.  Patient denies chest pain at this time.  Patient describes right-handed numbness is only including the fifth and fourth digits as well as medial aspect of forearm.  No numbness above elbow.  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  Patient does endorse family cardiac history with grandfather who had MI at 71 years of age.  HPI     Past Medical History:  Diagnosis Date  . Anxiety   . Bipolar 1 disorder (HCC)   . Depression   . Hallucination   . PTSD (post-traumatic stress disorder)     Patient Active Problem List   Diagnosis Date Noted  . Anxiety and depression 06/25/2014  . Family history of diabetes mellitus (DM) 06/25/2014  . Family history of thyroid disease 06/25/2014  . Tobacco abuse 06/25/2014  . Alcohol abuse 05/18/2013  . Polysubstance abuse (HCC) 05/18/2013  . Depression 05/18/2013    Past Surgical History:  Procedure Laterality Date  . KIDNEY DONATION Right 2003       Family History  Problem Relation Age of Onset  . Heart disease Mother   . Cancer Mother   . Heart disease Father   . Diabetes Father   . Diabetes Maternal Grandmother   . Stroke Paternal Grandfather     Social  History   Tobacco Use  . Smoking status: Current Every Day Smoker    Packs/day: 2.00    Types: Cigarettes  . Smokeless tobacco: Never Used  Substance Use Topics  . Alcohol use: No    Alcohol/week: 32.0 standard drinks    Types: 12 Cans of beer, 20 Shots of liquor per week    Comment:  last use was 1 month ago   . Drug use: Yes    Types: Marijuana, Cocaine, Heroin    Comment: history of IVDA    Home Medications Prior to Admission medications   Medication Sig Start Date End Date Taking? Authorizing Provider  acetaminophen (TYLENOL) 500 MG tablet Take 1,000 mg by mouth every 6 (six) hours as needed for headache.   Yes [provider]  bacitracin 500 UNIT/GM ointment Apply 1 application topically daily as needed (facial rash).   Yes [provider]  Polyvinyl Alcohol-Povidone (REFRESH OP) Place 1 drop into both eyes daily as needed (dry eyes/irritation).   Yes [provider]  buPROPion (WELLBUTRIN SR) 100 MG 12 hr tablet Take 1 tablet (100 mg total) by mouth 2 (two) times daily. Patient not taking: Reported on 10/06/2019 06/25/14   Doris Cheadle, MD    Allergies    Penicillins  Review of Systems   Review of Systems  Constitutional: Positive for fever and unexpected weight change. Negative for chills.  Patient endorses subjective fever.  Patient endorses greater than 20 pounds of weight loss in the past 6 months.  HENT: Positive for congestion. Negative for rhinorrhea and sneezing.   Respiratory: Positive for cough and shortness of breath.   Cardiovascular: Negative for chest pain and leg swelling.  Gastrointestinal: Positive for nausea. Negative for abdominal pain and vomiting.  Genitourinary: Negative for dysuria and hematuria.  Musculoskeletal: Negative for gait problem and joint swelling.  Skin: Negative for rash and wound.  Neurological: Positive for numbness. Negative for dizziness and headaches.       Patient endorses numbness to right fifth,  fourth digits and medial aspect of forearm that is intermittent.  Psychiatric/Behavioral: The patient is nervous/anxious and is hyperactive.     Physical Exam Updated Vital Signs BP 109/69   Pulse 84   Temp 99 F (37.2 C) (Oral)   Resp 19   Ht 5\' 9"  (1.753 m)   Wt 68 kg   SpO2 93%   BMI 22.15 kg/m   Physical Exam Vitals and nursing note reviewed.  Constitutional:      Appearance: He is well-developed.  HENT:     Head: Normocephalic and atraumatic.  Eyes:     Conjunctiva/sclera: Conjunctivae normal.  Cardiovascular:     Rate and Rhythm: Normal rate and regular rhythm.     Heart sounds: No murmur heard.   Pulmonary:     Effort: Pulmonary effort is normal. No respiratory distress.     Breath sounds: Normal breath sounds. No decreased breath sounds or wheezing.  Abdominal:     Palpations: Abdomen is soft.     Tenderness: There is no abdominal tenderness.  Musculoskeletal:     Cervical back: Neck supple.     Right lower leg: No tenderness. No edema.     Left lower leg: No tenderness. No edema.  Skin:    General: Skin is warm and dry.  Neurological:     General: No focal deficit present.     Mental Status: He is alert.     GCS: GCS eye subscore is 4. GCS verbal subscore is 5. GCS motor subscore is 6.     Sensory: Sensation is intact.     Motor: Motor function is intact. No weakness.     Comments: Patient endorses numbness to right fifth, fourth digits as well as medial aspect of forearm consistent with ulnar distribution.  No obvious deformity to elbow, intact range of motion.  NVI.  Psychiatric:        Mood and Affect: Mood is anxious.        Speech: Speech is rapid and pressured.        Behavior: Behavior is agitated.     ED Results / Procedures / Treatments   Labs (all labs ordered are listed, but only abnormal results are displayed) Labs Reviewed  BASIC METABOLIC PANEL - Abnormal; Notable for the following components:      Result Value   Sodium 134 (*)     CO2 20 (*)    Glucose, Bld 100 (*)    BUN 5 (*)    All other components within normal limits  SARS CORONAVIRUS 2 BY RT PCR (HOSPITAL ORDER, PERFORMED IN [facility] LAB)  CBC  URINALYSIS, ROUTINE W REFLEX MICROSCOPIC  CBG MONITORING, ED    EKG EKG Interpretation  Date/Time:  Monday October 06 2019 10:29:32 EDT Ventricular Rate:  61 PR Interval:  100 QRS Duration: 96 QT Interval:  386 QTC Calculation: 388  R Axis:   98 Text Interpretation: Sinus rhythm with short PR Right atrial enlargement Rightward axis Pulmonary disease pattern Minimal voltage criteria for LVH, may be normal variant ( Cornell product ) Abnormal ECG No old tracing to compare Confirmed by Meridee Score [phone]) on 10/06/2019 6:06:54 PM   Radiology DG Chest Portable 1 View  Result Date: 10/06/2019 CLINICAL DATA:  Cough.  Chest pain and numbness. EXAM: PORTABLE CHEST 1 VIEW COMPARISON:  None. FINDINGS: Normal heart size. Prominence of the pulmonary arteries noted suggestive of PA hypertension. No pleural effusion or edema. No airspace densities. Review of the visualized osseous structures is unremarkable. IMPRESSION: 1. No active disease. 2. Suspect pulmonary arterial hypertension. Electronically Signed   By: Signa Kell M.D.   On: 10/06/2019 20:31    Procedures Procedures (including critical care time)  Medications Ordered in ED Medications  sodium chloride flush (NS) 0.9 % injection 3 mL (has no administration in time range)  hydrOXYzine (ATARAX/VISTARIL) tablet 25 mg (25 mg Oral Given 10/06/19 2007)    ED Course  I have reviewed the triage vital signs and the nursing notes.  Pertinent labs & imaging results that were available during my care of the patient were reviewed by me and considered in my medical decision making (see chart for details).    MDM Rules/Calculators/A&P                          Pt is a 44 yo M w Hx of anxiety, bipolar disorder, depression who presents with nausea, decreased  appetite, anxiousness, numbness over R arm. Patient states he treats his anxiety with marijuana use daily.  Patient endorses subjective fever, cough, congestion for the past 2 days as well as myalgias.  Patient has not been vaccinated for COVID.  Patient denies sick contacts.  On exam patient afebrile, HDS, NAD.  Patient without focal findings on physical exam.  Lungs clear to auscultation.  No overlying skin changes.  Patient very anxious appearing.  Patient with intact strength/sensation to bilateral upper extremities.  Patient pointing to right fifth/fourth digits, medial aspect of right forearm an area that he has numbness however this is not present at this time.  Consistent with ulnar distribution.  No obvious trauma, deformities at elbow.  Patient overall well-appearing.  Screening labs without significant abnormalities.  Specifically no leukocytosis on CBC, no anemia.  UA without evidence of infection.  Covid negative.  Chest x-ray completed without concern for acute cardiopulmonary findings. CXR was concerning for possible pulmonary hypertension. These findings were discussed with patient at bedside and explained need for follow up with PCP as outpatient.  Presentation today consistent with likely viral infection.  Discussed with patient that daily marijuana use may be contributing to worsening nausea.  Will provide patient with outpatient follow-up instructions at [facility]community wellness. Pt in agreement with plan. Pt discharged in stable condition.    Final Clinical Impression(s) / ED Diagnoses Final diagnoses:  Cough  Anxiety  Viral illness    Rx / DC Orders ED Discharge Orders    None       Golden Pop, MD 10/06/19 2349    Virgina Norfolk, DO 10/07/19 0102
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
## 2019-10-29 ENCOUNTER — Encounter: Payer: Self-pay | Admitting: Physician Assistant

## 2019-10-29 ENCOUNTER — Other Ambulatory Visit: Payer: Self-pay

## 2019-10-29 ENCOUNTER — Ambulatory Visit: Payer: 59 | Attending: Physician Assistant | Admitting: Physician Assistant

## 2019-10-29 DIAGNOSIS — G5621 Lesion of ulnar nerve, right upper limb: Secondary | ICD-10-CM

## 2019-10-29 DIAGNOSIS — F419 Anxiety disorder, unspecified: Secondary | ICD-10-CM | POA: Diagnosis not present

## 2019-10-29 DIAGNOSIS — R059 Cough, unspecified: Secondary | ICD-10-CM

## 2019-10-29 DIAGNOSIS — R200 Anesthesia of skin: Secondary | ICD-10-CM | POA: Diagnosis not present

## 2019-10-29 DIAGNOSIS — F4310 Post-traumatic stress disorder, unspecified: Secondary | ICD-10-CM

## 2019-10-29 DIAGNOSIS — Z09 Encounter for follow-up examination after completed treatment for conditions other than malignant neoplasm: Secondary | ICD-10-CM

## 2019-10-29 DIAGNOSIS — F1910 Other psychoactive substance abuse, uncomplicated: Secondary | ICD-10-CM

## 2019-10-29 DIAGNOSIS — R0602 Shortness of breath: Secondary | ICD-10-CM | POA: Diagnosis not present

## 2019-10-29 DIAGNOSIS — R05 Cough: Secondary | ICD-10-CM

## 2019-10-29 MED ORDER — FLUTICASONE-SALMETEROL 250-50 MCG/DOSE IN AEPB: 1.0000 | INHALATION_SPRAY | Freq: Two times a day (BID) | RESPIRATORY_TRACT | 2 refills | Status: DC

## 2019-10-29 MED ORDER — AZITHROMYCIN 250 MG PO TABS: ORAL_TABLET | ORAL | 0 refills | Status: DC

## 2019-10-29 MED ORDER — HYDROXYZINE PAMOATE 25 MG PO CAPS: 25.0000 mg | ORAL_CAPSULE | Freq: Three times a day (TID) | ORAL | 2 refills | Status: DC | PRN

## 2019-10-29 MED FILL — hydrOXYzine PAMOATE 25 MG C: 25 | 10 days supply | Qty: 30 | Fill #0

## 2019-10-30 MED FILL — !ADVAIR 250/50 DISKUS: 250-50 | 30 days supply | Qty: 60 | Fill #0

## 2019-10-30 MED FILL — AZITHROMYCIN 250 MG TABLET: 250 | 5 days supply | Qty: 6 | Fill #0

## 2019-11-06 ENCOUNTER — Ambulatory Visit: Payer: 59 | Admitting: Physician Assistant

## 2019-11-06 ENCOUNTER — Telehealth: Payer: Self-pay | Admitting: Licensed Clinical Social Worker

## 2019-11-06 NOTE — Telephone Encounter (Signed)
Call placed to patient regarding [facility] referral. LCSW left message requesting a return call.  

## 2019-12-01 ENCOUNTER — Telehealth: Payer: Self-pay | Admitting: Licensed Clinical Social Worker

## 2019-12-01 NOTE — Telephone Encounter (Signed)
Call placed to patient regarding [facility] referral. LCSW left message requesting a return call.  

## 2019-12-29 ENCOUNTER — Ambulatory Visit: Payer: 59 | Admitting: Family Medicine

## 2020-07-15 ENCOUNTER — Emergency Department (HOSPITAL_COMMUNITY)
Admission: EM | Admit: 2020-07-15 | Discharge: 2020-07-16 | Disposition: A | Discharge status: Home / Self Care | Payer: 59 | Attending: Emergency Medicine | Admitting: Emergency Medicine

## 2020-07-15 ENCOUNTER — Encounter (HOSPITAL_COMMUNITY): Payer: Self-pay

## 2020-07-15 ENCOUNTER — Other Ambulatory Visit: Payer: Self-pay

## 2020-07-15 DIAGNOSIS — F1924 Other psychoactive substance dependence with psychoactive substance-induced mood disorder: Secondary | ICD-10-CM | POA: Diagnosis not present

## 2020-07-15 DIAGNOSIS — F1910 Other psychoactive substance abuse, uncomplicated: Secondary | ICD-10-CM

## 2020-07-15 DIAGNOSIS — Z20822 Contact with and (suspected) exposure to covid-19: Secondary | ICD-10-CM | POA: Insufficient documentation

## 2020-07-15 DIAGNOSIS — F17210 Nicotine dependence, cigarettes, uncomplicated: Secondary | ICD-10-CM | POA: Insufficient documentation

## 2020-07-15 DIAGNOSIS — F200 Paranoid schizophrenia: Secondary | ICD-10-CM | POA: Insufficient documentation

## 2020-07-15 DIAGNOSIS — R443 Hallucinations, unspecified: Secondary | ICD-10-CM

## 2020-07-15 LAB — COMPREHENSIVE METABOLIC PANEL
ALT: 78 U/L — ABNORMAL HIGH (ref 0–44)
AST: 61 U/L — ABNORMAL HIGH (ref 15–41)
Albumin: 3.9 g/dL (ref 3.5–5.0)
Alkaline Phosphatase: 45 U/L (ref 38–126)
Anion gap: 10 (ref 5–15)
BUN: 12 mg/dL (ref 6–20)
CO2: 22 mmol/L (ref 22–32)
Calcium: 9 mg/dL (ref 8.9–10.3)
Chloride: 99 mmol/L (ref 98–111)
Creatinine, Ser: 0.94 mg/dL (ref 0.61–1.24)
GFR, Estimated: >60 mL/min (ref >60)
Glucose, Bld: 105 mg/dL — ABNORMAL HIGH (ref 70–99)
Potassium: 4.2 mmol/L (ref 3.5–5.1)
Sodium: 131 mmol/L — ABNORMAL LOW (ref 135–145)
Total Bilirubin: 1.2 mg/dL (ref 0.3–1.2)
Total Protein: 8.7 g/dL — ABNORMAL HIGH (ref 6.5–8.1)

## 2020-07-15 LAB — CBC WITH DIFFERENTIAL/PLATELET
Abs Immature Granulocytes: 0.02 10*3/uL (ref 0.00–0.07)
Basophils Absolute: 0 10*3/uL (ref 0.0–0.1)
Basophils Relative: 1 %
Eosinophils Absolute: 0.1 10*3/uL (ref 0.0–0.5)
Eosinophils Relative: 2 %
HCT: 42.4 % (ref 39.0–52.0)
Hemoglobin: 14.4 g/dL (ref 13.0–17.0)
Immature Granulocytes: 0 %
Lymphocytes Relative: 24 %
Lymphs Abs: 1.4 10*3/uL (ref 0.7–4.0)
MCH: 33 pg (ref 26.0–34.0)
MCHC: 34 g/dL (ref 30.0–36.0)
MCV: 97 fL (ref 80.0–100.0)
Monocytes Absolute: 0.8 10*3/uL (ref 0.1–1.0)
Monocytes Relative: 15 %
Neutro Abs: 3.3 10*3/uL (ref 1.7–7.7)
Neutrophils Relative %: 58 %
Platelets: 302 10*3/uL (ref 150–400)
RBC: 4.37 MIL/uL (ref 4.22–5.81)
RDW: 12.7 % (ref 11.5–15.5)
WBC: 5.6 10*3/uL (ref 4.0–10.5)
nRBC: 0 % (ref 0.0–0.2)

## 2020-07-15 LAB — RAPID URINE DRUG SCREEN, HOSP PERFORMED
Amphetamines: POSITIVE — AB
Barbiturates: NOT DETECTED
Benzodiazepines: NOT DETECTED
Cocaine: POSITIVE — AB
Opiates: NOT DETECTED
Tetrahydrocannabinol: POSITIVE — AB

## 2020-07-15 LAB — ETHANOL: Alcohol, Ethyl (B): <10 mg/dL (ref <10)

## 2020-07-15 LAB — RESP PANEL BY RT-PCR (FLU A&B, COVID) ARPGX2
Influenza A by PCR: NEGATIVE
Influenza B by PCR: NEGATIVE
SARS Coronavirus 2 by RT PCR: NEGATIVE

## 2020-07-15 MED ORDER — LORAZEPAM 2 MG/ML IJ SOLN
0.0000 mg | Freq: Four times a day (QID) | INTRAMUSCULAR | Status: DC
  Administered 2020-07-15 – 2020-07-16 (×3): 2 mg via INTRAVENOUS
  Filled 2020-07-15 (×3): qty 1

## 2020-07-15 MED ORDER — THIAMINE HCL 100 MG PO TABS
100.0000 mg | ORAL_TABLET | Freq: Every day | ORAL | Status: DC
  Administered 2020-07-16: 100 mg via ORAL
  Filled 2020-07-15: qty 1

## 2020-07-15 MED ORDER — SODIUM CHLORIDE 0.9 % IV BOLUS
1000.0000 mL | Freq: Once | INTRAVENOUS | Status: AC
  Administered 2020-07-15: 1000 mL via INTRAVENOUS

## 2020-07-15 MED ORDER — LORAZEPAM 2 MG/ML IJ SOLN: 0.0000 mg | Freq: Two times a day (BID) | INTRAMUSCULAR | Status: DC

## 2020-07-15 MED ORDER — LORAZEPAM 2 MG/ML IJ SOLN
1.0000 mg | Freq: Once | INTRAMUSCULAR | Status: AC
  Administered 2020-07-15: 1 mg via INTRAVENOUS
  Filled 2020-07-15: qty 1

## 2020-07-15 MED ORDER — LORAZEPAM 1 MG PO TABS: 0.0000 mg | ORAL_TABLET | Freq: Two times a day (BID) | ORAL | Status: DC

## 2020-07-15 MED ORDER — LORAZEPAM 1 MG PO TABS
0.0000 mg | ORAL_TABLET | Freq: Four times a day (QID) | ORAL | Status: DC
  Administered 2020-07-15: 2 mg via ORAL
  Filled 2020-07-15: qty 2

## 2020-07-15 MED ORDER — THIAMINE HCL 100 MG/ML IJ SOLN
100.0000 mg | Freq: Every day | INTRAMUSCULAR | Status: DC
  Administered 2020-07-15: 100 mg via INTRAVENOUS
  Filled 2020-07-15: qty 2

## 2020-07-15 NOTE — ED Notes (Signed)
2 IV attempts, unsuccessful 

## 2020-07-15 NOTE — ED Notes (Signed)
Patient's belongings placed in cupboard at this time

## 2020-07-15 NOTE — ED Notes (Signed)
Pt asleep in bed. Respirations even and unlabored.

## 2020-07-15 NOTE — ED Triage Notes (Signed)
[place] [employer] [place] [place], ems and pd called out, wandering around talking to himself, did meth 4 days ago, hasnt slept since then, eating, not drinking, homeless, auditory hallucinations

## 2020-07-15 NOTE — ED Provider Notes (Addendum)
[facility]-EMERGENCY DEPT Provider Note   CSN: [id number] Arrival date & time: 07/15/20  0747     History Chief Complaint  Patient presents with  . Psychiatric Evaluation  . Drug / Alcohol Assessment    Jerry [name] is a 45 y.o. male.  Patient with history of bipolar disorder, admits to abusing methamphetamine the last couple days.  Having worsening anxiety, stress, some hallucinations.  Not taking any psych medications.  Denies any suicidal homicidal ideation.  The history is provided by the patient.  Drug / Alcohol Assessment Similar prior episodes: yes   Severity:  Mild Onset quality:  Gradual Timing:  Constant Progression:  Unchanged Chronicity:  New Suspected agents: meth use. Associated symptoms: agitation and hallucinations   Associated symptoms: no abdominal pain, no palpitations, no seizures, no shortness of breath and no vomiting        Past Medical History:  Diagnosis Date  . Anxiety   . Bipolar 1 disorder (HCC)   . Depression   . Hallucination   . PTSD (post-traumatic stress disorder)     Patient Active Problem List   Diagnosis Date Noted  . Anxiety and depression 06/25/2014  . Family history of diabetes mellitus (DM) 06/25/2014  . Family history of thyroid disease 06/25/2014  . Tobacco abuse 06/25/2014  . Alcohol abuse 05/18/2013  . Polysubstance abuse (HCC) 05/18/2013  . Depression 05/18/2013    Past Surgical History:  Procedure Laterality Date  . KIDNEY DONATION Right 2003       Family History  Problem Relation Age of Onset  . Heart disease Mother   . Cancer Mother   . Heart disease Father   . Diabetes Father   . Diabetes Maternal Grandmother   . Stroke Paternal Grandfather     Social History   Tobacco Use  . Smoking status: Current Every Day Smoker    Packs/day: 2.00    Types: Cigarettes  . Smokeless tobacco: Never Used  Substance Use Topics  . Alcohol use: No    Alcohol/week: 32.0 standard drinks     Types: 12 Cans of beer, 20 Shots of liquor per week    Comment:  last use was 1 month ago   . Drug use: Yes    Types: Marijuana, Cocaine, Heroin    Comment: history of IVDA    Home Medications Prior to Admission medications   Not on File    Allergies    Penicillins  Review of Systems   Review of Systems  Constitutional: Negative for chills and fever.  HENT: Negative for ear pain and sore throat.   Eyes: Negative for pain and visual disturbance.  Respiratory: Negative for cough and shortness of breath.   Cardiovascular: Negative for chest pain and palpitations.  Gastrointestinal: Negative for abdominal pain and vomiting.  Genitourinary: Negative for dysuria and hematuria.  Musculoskeletal: Negative for arthralgias and back pain.  Skin: Negative for color change and rash.  Neurological: Negative for seizures and syncope.  Psychiatric/Behavioral: Positive for agitation and hallucinations.  All other systems reviewed and are negative.   Physical Exam Updated Vital Signs BP 112/79   Pulse 71   Temp 98.2 F (36.8 C)   Resp 17   SpO2 98%   Physical Exam Vitals and nursing note reviewed.  Constitutional:      General: He is not in acute distress.    Appearance: He is well-developed. He is not ill-appearing.  HENT:     Head: Normocephalic and atraumatic.  Mouth/Throat:     Mouth: Mucous membranes are moist.  Eyes:     Extraocular Movements: Extraocular movements intact.     Conjunctiva/sclera: Conjunctivae normal.     Pupils: Pupils are equal, round, and reactive to light.  Cardiovascular:     Rate and Rhythm: Normal rate and regular rhythm.     Pulses: Normal pulses.     Heart sounds: Normal heart sounds. No murmur heard.   Pulmonary:     Effort: Pulmonary effort is normal. No respiratory distress.     Breath sounds: Normal breath sounds.  Abdominal:     Palpations: Abdomen is soft.     Tenderness: There is no abdominal tenderness.  Musculoskeletal:      Cervical back: Neck supple.  Skin:    General: Skin is warm and dry.     Capillary Refill: Capillary refill takes less than 2 seconds.  Neurological:     General: No focal deficit present.     Mental Status: He is alert.     Cranial Nerves: No cranial nerve deficit.     Sensory: No sensory deficit.     Motor: No weakness.     Coordination: Coordination normal.  Psychiatric:        Mood and Affect: Mood normal.     Comments: Patient is slightly anxious, denies any suicidal homicidal ideation, having some worsening of his hallucination     ED Results / Procedures / Treatments   Labs (all labs ordered are listed, but only abnormal results are displayed) Labs Reviewed  COMPREHENSIVE METABOLIC PANEL - Abnormal; Notable for the following components:      Result Value   Sodium 131 (*)    Glucose, Bld 105 (*)    Total Protein 8.7 (*)    AST 61 (*)    ALT 78 (*)    All other components within normal limits  RESP PANEL BY RT-PCR (FLU A&B, COVID) ARPGX2  CBC WITH DIFFERENTIAL/PLATELET  ETHANOL  RAPID URINE DRUG SCREEN, HOSP PERFORMED    EKG EKG Interpretation  Date/Time:  Thursday July 15 2020 08:12:00 EDT Ventricular Rate:  93 PR Interval:  142 QRS Duration: 94 QT Interval:  354 QTC Calculation: 440 R Axis:   90 Text Interpretation: Normal sinus rhythm Rightward axis Minimal voltage criteria for LVH, may be normal variant ( Cornell product ) Borderline ECG Confirmed by Lockie Mola, Edwardine Deschepper ([id number]) on 07/15/2020 8:17:37 AM   Radiology No results found.  Procedures Procedures   Medications Ordered in ED Medications  LORazepam (ATIVAN) injection 0-4 mg (has no administration in time range)    Or  LORazepam (ATIVAN) tablet 0-4 mg (has no administration in time range)  LORazepam (ATIVAN) injection 0-4 mg (has no administration in time range)    Or  LORazepam (ATIVAN) tablet 0-4 mg (has no administration in time range)  thiamine tablet 100 mg (has no administration in time  range)    Or  thiamine (B-1) injection 100 mg (has no administration in time range)  LORazepam (ATIVAN) injection 1 mg (1 mg Intravenous Given 07/15/20 0850)  sodium chloride 0.9 % bolus 1,000 mL (1,000 mLs Intravenous New Bag/Given 07/15/20 0846)    ED Course  I have reviewed the triage vital signs and the nursing notes.  Pertinent labs & imaging results that were available during my care of the patient were reviewed by me and considered in my medical decision making (see chart for details).    MDM Rules/Calculators/A&P  Jerry [name] is here with methamphetamine use making worsening hallucinations.  History of bipolar.  Noncompliant with psych medications.  Denies any suicidal homicidal ideation.  Just used meth several hours prior to arrival and states that he is having difficulty with his hallucinations.  Will give IV fluids and IV Ativan.  Will allow him to metabolize methamphetamine evaluate.  Lab work is overall unremarkable.  Patient feeling better but is requesting psychiatric evaluation.  He has not been taking any medications for his bipolar.  Suspect that this is exacerbated by drug use.  Patient here voluntarily.  Medically cleared.  Awaiting psychiatric evaluation.  This chart was dictated using voice recognition software.  Despite best efforts to proofread,  errors can occur which can change the documentation meaning.   Final Clinical Impression(s) / ED Diagnoses Final diagnoses:  Hallucination  Polysubstance abuse [facility])    Rx / DC Orders ED Discharge Orders    None       Virgina Norfolk, DO 07/15/20 1224    Virgina Norfolk, DO 07/15/20 1225

## 2020-07-15 NOTE — BH Assessment (Signed)
Requested patient's nurse to set up TTS machine.

## 2020-07-15 NOTE — BH Assessment (Addendum)
Per Vernard Gambles, NP, patient meets criteria for INPT psychiatric treatment. Disposition Social Worker to seek appropriate placement. Patient's nurse Elliot Gurney, RN) and charge nurse Kennyth Arnold, RN) provided disposition updates.

## 2020-07-15 NOTE — ED Notes (Signed)
Patient sleeping, however this writer was able to wake pt to inform him of order for urine specimen. Pt unable to provide sample at this time, was provided with urinal within reach.

## 2020-07-15 NOTE — ED Notes (Signed)
Attempted IV placement X2-labs collected

## 2020-07-15 NOTE — ED Notes (Signed)
Pt paranoid and expressing hallucinations of someone "trying to kill him." Pt with visible tremors. Pt constantly looking around stating "They are trying to kill me. I am going to die here" Pt agitated at alarms and bells. Pt unable to be redirected. Pt dose of ativan per CIWA given. This RN and NT Delorise Shiner consoling patient. Dinner tray given to patient. Pt calm and cooperative at this time.

## 2020-07-15 NOTE — ED Notes (Signed)
Patient continues to rest in bed with eyes closed at this time 

## 2020-07-15 NOTE — ED Notes (Addendum)
Patient resting in bed with eyes closed, will continue to monitor.
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
## 2020-07-16 ENCOUNTER — Inpatient Hospital Stay (HOSPITAL_COMMUNITY)
Admission: AD | Admit: 2020-07-16 | Discharge: 2020-07-21 | DRG: 885 | Disposition: A | Discharge status: Home / Self Care | Payer: 59 | Source: Intra-hospital | Attending: Psychiatry | Admitting: Psychiatry

## 2020-07-16 ENCOUNTER — Encounter (HOSPITAL_COMMUNITY): Payer: Self-pay | Admitting: Psychiatry

## 2020-07-16 ENCOUNTER — Other Ambulatory Visit: Payer: Self-pay

## 2020-07-16 ENCOUNTER — Other Ambulatory Visit: Payer: Self-pay | Admitting: Psychiatry

## 2020-07-16 DIAGNOSIS — Z9151 Personal history of suicidal behavior: Secondary | ICD-10-CM | POA: Diagnosis not present

## 2020-07-16 DIAGNOSIS — R45851 Suicidal ideations: Secondary | ICD-10-CM | POA: Diagnosis present

## 2020-07-16 DIAGNOSIS — Z5900 Homelessness unspecified: Secondary | ICD-10-CM

## 2020-07-16 DIAGNOSIS — F29 Unspecified psychosis not due to a substance or known physiological condition: Principal | ICD-10-CM

## 2020-07-16 DIAGNOSIS — F1410 Cocaine abuse, uncomplicated: Secondary | ICD-10-CM

## 2020-07-16 DIAGNOSIS — F1210 Cannabis abuse, uncomplicated: Secondary | ICD-10-CM

## 2020-07-16 DIAGNOSIS — F15159 Other stimulant abuse with stimulant-induced psychotic disorder, unspecified: Secondary | ICD-10-CM | POA: Diagnosis present

## 2020-07-16 DIAGNOSIS — F32A Depression, unspecified: Secondary | ICD-10-CM

## 2020-07-16 DIAGNOSIS — F1510 Other stimulant abuse, uncomplicated: Secondary | ICD-10-CM

## 2020-07-16 DIAGNOSIS — F329 Major depressive disorder, single episode, unspecified: Secondary | ICD-10-CM | POA: Diagnosis present

## 2020-07-16 DIAGNOSIS — F200 Paranoid schizophrenia: Secondary | ICD-10-CM | POA: Diagnosis not present

## 2020-07-16 LAB — COMPREHENSIVE METABOLIC PANEL
ALT: 61 U/L — ABNORMAL HIGH (ref 0–44)
AST: 39 U/L (ref 15–41)
Albumin: 3.5 g/dL (ref 3.5–5.0)
Alkaline Phosphatase: 40 U/L (ref 38–126)
Anion gap: 6 (ref 5–15)
BUN: 16 mg/dL (ref 6–20)
CO2: 23 mmol/L (ref 22–32)
Calcium: 9.2 mg/dL (ref 8.9–10.3)
Chloride: 105 mmol/L (ref 98–111)
Creatinine, Ser: 0.83 mg/dL (ref 0.61–1.24)
GFR, Estimated: >60 mL/min (ref >60)
Glucose, Bld: 111 mg/dL — ABNORMAL HIGH (ref 70–99)
Potassium: 4.1 mmol/L (ref 3.5–5.1)
Sodium: 134 mmol/L — ABNORMAL LOW (ref 135–145)
Total Bilirubin: 1 mg/dL (ref 0.3–1.2)
Total Protein: 7.9 g/dL (ref 6.5–8.1)

## 2020-07-16 MED ORDER — ACETAMINOPHEN 325 MG PO TABS
650.0000 mg | ORAL_TABLET | Freq: Four times a day (QID) | ORAL | Status: DC | PRN
  Administered 2020-07-20: 650 mg via ORAL
  Filled 2020-07-16: qty 2

## 2020-07-16 MED ORDER — TRAZODONE HCL 50 MG PO TABS
50.0000 mg | ORAL_TABLET | Freq: Every evening | ORAL | Status: DC | PRN
  Administered 2020-07-17 – 2020-07-20 (×4): 50 mg via ORAL
  Filled 2020-07-16 (×4): qty 1

## 2020-07-16 MED ORDER — ZIPRASIDONE MESYLATE 20 MG IM SOLR: 20.0000 mg | INTRAMUSCULAR | Status: DC | PRN

## 2020-07-16 MED ORDER — OLANZAPINE 5 MG PO TBDP
5.0000 mg | ORAL_TABLET | Freq: Three times a day (TID) | ORAL | Status: DC | PRN
  Administered 2020-07-17 (×2): 5 mg via ORAL
  Filled 2020-07-16 (×2): qty 1

## 2020-07-16 MED ORDER — HYDROXYZINE HCL 25 MG PO TABS
25.0000 mg | ORAL_TABLET | Freq: Three times a day (TID) | ORAL | Status: DC | PRN
  Administered 2020-07-17 (×2): 25 mg via ORAL
  Filled 2020-07-16 (×2): qty 1

## 2020-07-16 MED ORDER — LORAZEPAM 1 MG PO TABS: 1.0000 mg | ORAL_TABLET | ORAL | Status: DC | PRN

## 2020-07-16 NOTE — ED Notes (Signed)
Report given to receiving RN. Due to safety concerns expressed by the receiving RN a delay has been requested. She will call back.

## 2020-07-16 NOTE — ED Provider Notes (Signed)
Emergency Medicine Observation Re-evaluation Note  Jerry [name] is a 45 y.o. male, seen on rounds today.  Pt initially presented to the ED for complaints of Psychiatric Evaluation Currently, the patient is resting.  Physical Exam  BP 122/75 (BP Location: Left Arm)   Pulse 66   Temp (!) 97 F (36.1 C) (Oral)   Resp 18   SpO2 96%  Physical Exam General: Resting in bed Cardiac: Warm and well-perfused Lungs: Even unlabored, no respiratory distress Psych: Calm and cooperative  ED Course / MDM  EKG:EKG Interpretation  Date/Time:  Thursday July 15 2020 08:12:00 EDT Ventricular Rate:  93 PR Interval:  142 QRS Duration: 94 QT Interval:  354 QTC Calculation: 440 R Axis:   90 Text Interpretation: Normal sinus rhythm Rightward axis Minimal voltage criteria for LVH, may be normal variant ( Cornell product ) Borderline ECG Confirmed by Lockie Mola, Adam ([id number]) on 07/15/2020 8:17:37 AM   I have reviewed the labs performed to date as well as medications administered while in observation.  Recent changes in the last 24 hours include psych rec in patient .  Plan  Current plan is for in patient treatment. Placement per psych team.   Milagros Loll, MD 07/16/20 1011

## 2020-07-16 NOTE — ED Notes (Signed)
Attempted report to the receiving RN. RN will call back.

## 2020-07-16 NOTE — BH Assessment (Signed)
BHH Assessment Progress Note  Per Vernard Gambles, NP, this pt requires psychiatric hospitalization at this time.  Danika, RN, [facility] has assigned pt to [facility] Rm 404-2 to the service of Landry Mellow, MD.  [facility] will be ready to receive pt at 14:00.  Pt has signed Voluntary Admission and Consent for Treatment, as well as Consent to Release Information to no one, and signed forms have been faxed to [facility].  EDP Marianna Fuss, MD and pt's nurse, Toni Amend, have been notified, and Toni Amend agrees to send original paperwork along with pt via [facility] [employer], and to call report to [phone].  Doylene Canning, [place] Behavioral Health Coordinator [phone]

## 2020-07-16 NOTE — Progress Notes (Signed)
Adult Psychoeducational Group Note  Date:  07/17/2020 Time:  12:15 AM  Group Topic/Focus:  Wrap-Up Group:   The focus of this group is to help patients review their daily goal of treatment and discuss progress on daily workbooks.  Participation Level:  Did Not Attend  Participation Quality:  Did Not Attend  Affect:  Did Not Attend  Cognitive:  Did Not Attend  Insight: None  Engagement in Group:  Did Not Attend  Modes of Intervention:  Did Not Attend  Additional Comments:  Pt did not attend evening wrap up group tonight.  [name] 07/17/2020, 12:15 AM

## 2020-07-16 NOTE — Tx Team (Signed)
Initial Treatment Plan 07/16/2020 5:39 PM [name] DXI:[id number]    PATIENT STRESSORS: Financial difficulties Health problems Marital or family conflict Substance abuse   PATIENT STRENGTHS: Ability for insight Communication skills Motivation for treatment/growth   PATIENT IDENTIFIED PROBLEMS: Paranoia  Polysubstance Abuse  Medication noncompliance  Hallucinations               DISCHARGE CRITERIA:  Ability to meet basic life and health needs Motivation to continue treatment in a less acute level of care Safe-care adequate arrangements made  PRELIMINARY DISCHARGE PLAN: Attend aftercare/continuing care group Outpatient therapy Placement in alternative living arrangements  PATIENT/FAMILY INVOLVEMENT: This treatment plan has been presented to and reviewed with the patient, Jerry [name].  The patient and family have been given the opportunity to ask questions and make suggestions.  Clarene Critchley, RN 07/16/2020, 5:39 PM

## 2020-07-16 NOTE — Progress Notes (Signed)
Jerry [name] has been in his room much of the evening.  He was pleasant and cooperative.  He continues to report auditory hallucinations and "they are always there" and denies any command hallucinations.  He denied SI/HI or visual hallucinations at this time.  He agrees to seek out staff if those worsen.  He does report feeling paranoid but wouldn't elaborate.  He denied any pain or discomfort and appears to be in no physical distress.  He did get up to eat a snack and request something for sleep.  No order for sleep medications available.  Notified NP and orders obtained however, pt was asleep when available to give his the medications.  He is currently resting with his eyes closed and appears to be asleep.  Q 15 minute checks maintained for safety.  We will continue to monitor the progress towards his goals.   07/16/20 2130  Psych Admission Type (Psych Patients Only)  Admission Status Voluntary  Psychosocial Assessment  Patient Complaints Anxiety;Insomnia  Eye Contact Brief  Facial Expression Anxious  Affect Anxious  Speech Logical/coherent  Interaction Assertive;Guarded  Motor Activity Slow  Appearance/Hygiene Disheveled  Behavior Characteristics Cooperative  Mood Suspicious;Irritable  Thought Process  Coherency WDL  Content Paranoia;Preoccupation  Delusions Paranoid  Perception Hallucinations  Hallucination Auditory;Visual  Judgment Impaired  Confusion None  Danger to Self  Current suicidal ideation? Denies  Danger to Others  Danger to Others None reported or observed

## 2020-07-16 NOTE — Progress Notes (Signed)
Admission Note: Patient is a 45 year old male admitted to the unit from [facility] for symptoms of paranoia, medication noncompliance and polysubstance abuse.  Patient report audiovisual hallucinations of hearing voices telling him to kill himself and people coming to kill him.  Patient stop taking his medications for one year due to lack of insurance.  UDS was positive for Amphetamines, Cocaine and THC.  States his goal is to resume his medication and a better wellbeing.  Patient presents with anxious affect and paranoid behavior.  Appears guarded and suspicious.  Patient observed looking around the room during assessment.  Admission plan of care reviewed and consent for treatment signed.  Skin assessment and personal belongings completed.  Skin is dry and intact.  No contraband found.  Patient oriented to the unit, staff and room.  Routine safety checks initiated.  Understanding of unit rules/protocols verbalized.  Patient is safe on the unit.

## 2020-07-17 DIAGNOSIS — F1210 Cannabis abuse, uncomplicated: Secondary | ICD-10-CM

## 2020-07-17 DIAGNOSIS — F1510 Other stimulant abuse, uncomplicated: Secondary | ICD-10-CM

## 2020-07-17 DIAGNOSIS — F332 Major depressive disorder, recurrent severe without psychotic features: Secondary | ICD-10-CM | POA: Insufficient documentation

## 2020-07-17 DIAGNOSIS — F1410 Cocaine abuse, uncomplicated: Secondary | ICD-10-CM

## 2020-07-17 DIAGNOSIS — F29 Unspecified psychosis not due to a substance or known physiological condition: Principal | ICD-10-CM

## 2020-07-17 LAB — COMPREHENSIVE METABOLIC PANEL
ALT: 53 U/L — ABNORMAL HIGH (ref 0–44)
AST: 35 U/L (ref 15–41)
Albumin: 3.6 g/dL (ref 3.5–5.0)
Alkaline Phosphatase: 46 U/L (ref 38–126)
Anion gap: 10 (ref 5–15)
BUN: 12 mg/dL (ref 6–20)
CO2: 22 mmol/L (ref 22–32)
Calcium: 9.4 mg/dL (ref 8.9–10.3)
Chloride: 100 mmol/L (ref 98–111)
Creatinine, Ser: 0.77 mg/dL (ref 0.61–1.24)
GFR, Estimated: >60 mL/min (ref >60)
Glucose, Bld: 109 mg/dL — ABNORMAL HIGH (ref 70–99)
Potassium: 3.9 mmol/L (ref 3.5–5.1)
Sodium: 132 mmol/L — ABNORMAL LOW (ref 135–145)
Total Bilirubin: 1.4 mg/dL — ABNORMAL HIGH (ref 0.3–1.2)
Total Protein: 8.4 g/dL — ABNORMAL HIGH (ref 6.5–8.1)

## 2020-07-17 LAB — LIPID PANEL
Cholesterol: 173 mg/dL (ref 0–200)
HDL: 52 mg/dL
LDL Cholesterol: 100 mg/dL — ABNORMAL HIGH (ref 0–99)
Total CHOL/HDL Ratio: 3.3 ratio
Triglycerides: 106 mg/dL
VLDL: 21 mg/dL (ref 0–40)

## 2020-07-17 LAB — TSH: TSH: 0.751 u[IU]/mL (ref 0.350–4.500)

## 2020-07-17 LAB — HEMOGLOBIN A1C
Hgb A1c MFr Bld: 5.3 % (ref 4.8–5.6)
Mean Plasma Glucose: 105.41 mg/dL

## 2020-07-17 MED ORDER — LORAZEPAM 1 MG PO TABS: 1.0000 mg | ORAL_TABLET | Freq: Four times a day (QID) | ORAL | Status: AC | PRN

## 2020-07-17 MED ORDER — MAGNESIUM HYDROXIDE 400 MG/5ML PO SUSP: 30.0000 mL | Freq: Every day | ORAL | Status: DC | PRN

## 2020-07-17 MED ORDER — THIAMINE HCL 100 MG PO TABS
100.0000 mg | ORAL_TABLET | Freq: Every day | ORAL | Status: DC
  Administered 2020-07-18 – 2020-07-21 (×4): 100 mg via ORAL
  Filled 2020-07-17 (×5): qty 1

## 2020-07-17 MED ORDER — ALUM & MAG HYDROXIDE-SIMETH 200-200-20 MG/5ML PO SUSP: 30.0000 mL | ORAL | Status: DC | PRN

## 2020-07-17 MED ORDER — ADULT MULTIVITAMIN W/MINERALS CH
1.0000 | ORAL_TABLET | Freq: Every day | ORAL | Status: DC
  Administered 2020-07-17 – 2020-07-21 (×5): 1 via ORAL
  Filled 2020-07-17 (×7): qty 1

## 2020-07-17 MED ORDER — ONDANSETRON 4 MG PO TBDP: 4.0000 mg | ORAL_TABLET | Freq: Four times a day (QID) | ORAL | Status: AC | PRN

## 2020-07-17 MED ORDER — LOPERAMIDE HCL 2 MG PO CAPS: 2.0000 mg | ORAL_CAPSULE | ORAL | Status: AC | PRN

## 2020-07-17 MED ORDER — HYDROXYZINE HCL 25 MG PO TABS: 25.0000 mg | ORAL_TABLET | Freq: Four times a day (QID) | ORAL | Status: AC | PRN

## 2020-07-17 MED ORDER — NICOTINE 21 MG/24HR TD PT24
21.0000 mg | MEDICATED_PATCH | Freq: Every day | TRANSDERMAL | Status: DC
  Administered 2020-07-17 – 2020-07-21 (×5): 21 mg via TRANSDERMAL
  Filled 2020-07-17 (×8): qty 1

## 2020-07-17 MED ORDER — RISPERIDONE 1 MG PO TABS
1.0000 mg | ORAL_TABLET | Freq: Every day | ORAL | Status: DC
  Administered 2020-07-17 – 2020-07-20 (×4): 1 mg via ORAL
  Filled 2020-07-17 (×6): qty 1

## 2020-07-17 NOTE — Progress Notes (Signed)
Adult Psychoeducational Group Note  Date:  07/17/2020 Time:  10:51 PM  Group Topic/Focus:  Wrap-Up Group:   The focus of this group is to help patients review their daily goal of treatment and discuss progress on daily workbooks.  Participation Level:  Minimal  Participation Quality:  Appropriate  Affect:  Appropriate  Cognitive:  Appropriate  Insight: Appropriate  Engagement in Group:  Developing/Improving  Modes of Intervention:  Discussion  Additional Comments: Pt stated his goal for today was to focus on his treatment plan. Pt stated he accomplished his goal today. Pt stated he talked with his doctor but did not get a chance to talk with his social worker about his care today. Pt rated his overall day a 7 out of 10. Pt stated he made no calls today. Pt stated he felt a little better about himself today. Pt stated he did not get a chance to attend groups today. But planned to attend them tomorrow. Pt stated he was able to attend all meals. Pt stated he took all medications provided today. Pt stated his appetite was fair today. Pt rated sleep last night was pretty good. Pt stated the goal tonight was to get some rest. Pt stated he some physical pain tonight. Pt stated he has some moderate pain in his head. Pt rated the moderate pain in his head a 6 on the pain level scale. Pt nurse was updated on situation.  Pt admitted to dealing with some visual hallucinations and auditory issues tonight. Pt nurse was updated on situation.  Pt denies thoughts of harming himself or others. Pt stated he would alert staff if anything changed.   Jerry [name] 07/17/2020, 10:51 PM

## 2020-07-17 NOTE — Progress Notes (Signed)
Pt attended group last night and came up to request medications because of his racing thoughts. Pt was also disturbed from the hallucinations he was having and was becoming agitated. Pt was administered 5 mg of zyprexa PRN at 1942. It was effective and pt is currently resting in bed comfortably. Pt endorses AH of voices telling him to shoot himself to get it over with and to run away. He also endorses VH of people that are after him. He denies SI/HI. Actives listening, reassurance, and support provided. Medications administered as ordered by provider. Q 15 min safety checks continue. Pt's safety has been maintained.   07/17/20 2108  Psych Admission Type (Psych Patients Only)  Admission Status Voluntary  Psychosocial Assessment  Patient Complaints Anxiety;Substance abuse;Worrying  Eye Contact Fair  Facial Expression Anxious;Worried  Affect Anxious;Appropriate to circumstance;Depressed  Speech Logical/coherent  Interaction Assertive  Motor Activity Fidgety  Appearance/Hygiene Poor hygiene;Disheveled  Behavior Characteristics Cooperative;Anxious;Fidgety  Mood Depressed;Anxious;Suspicious  Thought Process  Coherency WDL  Content Preoccupation;Paranoia  Delusions Paranoid  Perception Hallucinations  Hallucination Auditory;Visual  Judgment Impaired  Confusion None  Danger to Self  Current suicidal ideation? Denies  Danger to Others  Danger to Others None reported or observed

## 2020-07-17 NOTE — H&P (Signed)
Psychiatric Admission Assessment Adult  Patient Identification: Jerry [name] MRN:  [id number] Date of Evaluation:  07/17/2020 Chief Complaint:  MDD (major depressive disorder) [F32.9] Principal Diagnosis: Substance-induced psychotic disorder (HCC) Diagnosis:  Principal Problem:   Substance-induced psychotic disorder (HCC) Active Problems:   MDD (major depressive disorder)   Major depressive disorder, recurrent severe without psychotic features (HCC)  History of Present Illness: Jerry [name] is a 45 year old male with a history of bipolar disorder and polysubstance abuse who presented to [facility] on 07/15/20 via EMS after being found in [employer] parking lot wandering and talking to himself. Patient reported having auditory hallucinations and using meth 4 days prior without any sleep since; he is currently homeless. UDS+ amphetamines, cocaine, and THC. BAL<10,    Assessment:  On assessment patient initially presented laying in bed then anxiously jumped out of bed once provider came to bedside. Patient reports feeling "not good" today. States he "keeps hearing things, just a conversation. I can't always tell what it is but it's voices that I've been hearing for a few days now". Patient reports feeling "very anxious" and unable to sleep. He further states "I keep hearing these voices, whispers sometimes. They don't say good things either". He denies any suicidal or homicidal ideations. Patient presents anxious and restless; states he "gets really paranoid around people". He continues to endorse auditory hallucinations with some paranoia. Denies any visual hallucinations and does not appear to be responding to any external/internal stimuli at this time. He endorses recent "meth" and alcohol use; UDS+ amphetamine, cocaine, and THC. BAL <10.   Associated Signs/Symptoms: Depression Symptoms:  depressed mood, anhedonia, feelings of worthlessness/guilt, difficulty  concentrating, hopelessness, anxiety, Duration of Depression Symptoms: No data recorded (Hypo) Manic Symptoms:  Impulsivity, Irritable Mood, Anxiety Symptoms:  Excessive Worry, Social Anxiety, Psychotic Symptoms:  Paranoia, PTSD Symptoms: NA Total Time spent with patient: 30 minutes  Past Psychiatric History:   -alcohol abuse  -polysubstance abuse  -anxiety  -depression  -major depression disorder  Is the patient at risk to self? No.  Has the patient been a risk to self in the past 6 months? No.  Has the patient been a risk to self within the distant past? Yes.    Is the patient a risk to others? No.  Has the patient been a risk to others in the past 6 months? No.  Has the patient been a risk to others within the distant past? No.   Prior Inpatient Therapy:  yes Prior Outpatient Therapy:  yes  Alcohol Screening: Patient refused Alcohol Screening Tool: Yes 1. How often do you have a drink containing alcohol?: Never 2. How many drinks containing alcohol do you have on a typical day when you are drinking?: 1 or 2 3. How often do you have six or more drinks on one occasion?: Never AUDIT-C Score: 0 9. Have you or someone else been injured as a result of your drinking?: No 10. Has a relative or friend or a doctor or another health worker been concerned about your drinking or suggested you cut down?: No Alcohol Use Disorder Identification Test Final Score (AUDIT): 0 Substance Abuse History in the last 12 months:  Yes.   Consequences of Substance Abuse: patient denies any Previous Psychotropic Medications: Yes  Psychological Evaluations: Yes  Past Medical History:  Past Medical History:  Diagnosis Date  . Anxiety   . Bipolar 1 disorder (HCC)   . Depression   . Hallucination   . PTSD (  post-traumatic stress disorder)     Past Surgical History:  Procedure Laterality Date  . KIDNEY DONATION Right 2003   Family History:  Family History  Problem Relation Age of Onset  .  Heart disease Mother   . Cancer Mother   . Heart disease Father   . Diabetes Father   . Diabetes Maternal Grandmother   . Stroke Paternal Grandfather    Family Psychiatric  History:  Not noted Tobacco Screening:   Social History:  Social History   Substance and Sexual Activity  Alcohol Use No  . Alcohol/week: 32.0 standard drinks  . Types: 12 Cans of beer, 20 Shots of liquor per week   Comment:  last use was 1 month ago      Social History   Substance and Sexual Activity  Drug Use Yes  . Types: Marijuana, Cocaine, Heroin, Amphetamines   Comment: history of IVDA    Additional Social History:  -polysubstance abuse  Allergies:   Allergies  Allergen Reactions  . Penicillins Other (See Comments)    Unknown reaction as a child   Lab Results:  Results for orders placed or performed during the hospital encounter of 07/16/20 (from the past 48 hour(s))  Comprehensive metabolic panel     Status: Abnormal   Collection Time: 07/17/20  6:36 AM  Result Value Ref Range   Sodium 132 (L) 135 - 145 mmol/L   Potassium 3.9 3.5 - 5.1 mmol/L   Chloride 100 98 - 111 mmol/L   CO2 22 22 - 32 mmol/L   Glucose, Bld 109 (H) 70 - 99 mg/dL    Comment: Glucose reference range applies only to samples taken after fasting for at least 8 hours.   BUN 12 6 - 20 mg/dL   Creatinine, Ser 4.33 0.61 - 1.24 mg/dL   Calcium 9.4 8.9 - 29.5 mg/dL   Total Protein 8.4 (H) 6.5 - 8.1 g/dL   Albumin 3.6 3.5 - 5.0 g/dL   AST 35 15 - 41 U/L   ALT 53 (H) 0 - 44 U/L   Alkaline Phosphatase 46 38 - 126 U/L   Total Bilirubin 1.4 (H) 0.3 - 1.2 mg/dL   GFR, Estimated >18 >84 mL/min    Comment: (NOTE) Calculated using the CKD-EPI Creatinine Equation (2021)    Anion gap 10 5 - 15    Comment: Performed at [facility], [address] [place], [place] [address]  Lipid panel     Status: Abnormal   Collection Time: 07/17/20  6:36 AM  Result Value Ref Range   Cholesterol 173 0 - 200 mg/dL    Triglycerides 301 <601 mg/dL   HDL 52 >09 mg/dL   Total CHOL/HDL Ratio 3.3 RATIO   VLDL 21 0 - 40 mg/dL   LDL Cholesterol 323 (H) 0 - 99 mg/dL    Comment:        Total Cholesterol/HDL:CHD Risk Coronary Heart Disease Risk Table                     Men   Women  1/2 Average Risk   3.4   3.3  Average Risk       5.0   4.4  2 X Average Risk   9.6   7.1  3 X Average Risk  23.4   11.0        Use the calculated Patient Ratio above and the CHD Risk Table to determine the patient's CHD Risk.  ATP III CLASSIFICATION (LDL):  <100     mg/dL   Optimal  [id number]  mg/dL   Near or Above                    Optimal  130-159  mg/dL   Borderline  [id number]  mg/dL   High  >811>190     mg/dL   Very High Performed at [facility], [address] [place], [place] [address]   Hemoglobin A1c     Status: None   Collection Time: 07/17/20  6:36 AM  Result Value Ref Range   Hgb A1c MFr Bld 5.3 4.8 - 5.6 %    Comment: (NOTE) Pre diabetes:          5.7%-6.4%  Diabetes:              >6.4%  Glycemic control for   <7.0% adults with diabetes    Mean Plasma Glucose 105.41 mg/dL    Comment: Performed at [facility], [address] [place], [place] [address]  TSH     Status: None   Collection Time: 07/17/20  6:36 AM  Result Value Ref Range   TSH 0.751 0.350 - 4.500 uIU/mL    Comment: Performed by a 3rd Generation assay with a functional sensitivity of <=0.01 uIU/mL. Performed at [facility], [address] [place], [place] [address]    Blood Alcohol level:  Lab Results  Component Value Date   [facility] <10 07/15/2020   ETH <11 05/18/2013   Metabolic Disorder Labs:  Lab Results  Component Value Date   HGBA1C 5.3 07/17/2020   MPG 105.41 07/17/2020   MPG 108 06/25/2014   No results found for: PROLACTIN Lab Results  Component Value Date   CHOL 173 07/17/2020   TRIG 106 07/17/2020   HDL 52 07/17/2020   CHOLHDL 3.3 07/17/2020   VLDL 21 07/17/2020    LDLCALC 100 (H) 07/17/2020   Current Medications: Current Facility-Administered Medications  Medication Dose Route Frequency Provider Last Rate Last Admin  . acetaminophen (TYLENOL) tablet 650 mg  650 mg Oral Q6H PRN Jaclyn Shaggyaylor, Cody W, PA-C      . alum & mag hydroxide-simeth (MAALOX/MYLANTA) 200-200-20 MG/5ML suspension 30 mL  30 mL Oral Q4H PRN Jaclyn Shaggyaylor, Cody W, PA-C      . hydrOXYzine (ATARAX/VISTARIL) tablet 25 mg  25 mg Oral TID PRN Jaclyn Shaggyaylor, Cody W, PA-C   25 mg at 07/17/20 0959  . OLANZapine zydis (ZYPREXA) disintegrating tablet 5 mg  5 mg Oral Q8H PRN Leevy-Johnson, Amogh Komatsu A, NP   5 mg at 07/17/20 0959   And  . LORazepam (ATIVAN) tablet 1 mg  1 mg Oral PRN Leevy-Johnson, Jhostin Epps A, NP       And  . ziprasidone (GEODON) injection 20 mg  20 mg Intramuscular PRN Leevy-Johnson, Quron Ruddy A, NP      . magnesium hydroxide (MILK OF MAGNESIA) suspension 30 mL  30 mL Oral Daily PRN Melbourne Abtsaylor, Cody W, PA-C      . nicotine (NICODERM CQ - dosed in mg/24 hours) patch 21 mg  21 mg Transdermal Daily Mason JimSingleton, Amy E, MD   21 mg at 07/17/20 1053  . traZODone (DESYREL) tablet 50 mg  50 mg Oral QHS PRN Jaclyn Shaggyaylor, Cody W, PA-C       PTA Medications: No medications prior to admission.   Musculoskeletal: Strength & Muscle Tone: within normal limits Gait & Station: normal Patient leans: N/A  Psychiatric Specialty  Exam:  Presentation  General Appearance: Disheveled  Eye Contact:Fleeting  Speech:Pressured  Speech Volume:Normal  Handedness:No data recorded  Mood and Affect  Mood:Anxious  Affect:Congruent  Thought Process  Thought Processes:Irrevelant (paranoid)  Duration of Psychotic Symptoms: No data recorded Past Diagnosis of Schizophrenia or Psychoactive disorder: No data recorded Descriptions of Associations:Loose  Orientation:Full (Time, Place and Person)  Thought Content:Illogical  Hallucinations:Hallucinations: None  Ideas of Reference:None  Suicidal Thoughts:Suicidal Thoughts:  No  Homicidal Thoughts:Homicidal Thoughts: No  Sensorium  Memory:Immediate Fair; Recent Fair; Remote Fair  Judgment:Intact  Insight:Shallow  Executive Functions  Concentration:Poor  Attention Span:Poor  Recall:Fair  Fund of Knowledge:Fair  Language:Fair  Psychomotor Activity  Psychomotor Activity:Psychomotor Activity: Increased  Assets  Assets:Communication Skills; Resilience; Physical Health  Sleep  Sleep:Sleep: Fair  Physical Exam: Physical Exam Vitals and nursing note reviewed.  Psychiatric:        Attention and Perception: He perceives auditory hallucinations. He does not perceive visual hallucinations.        Mood and Affect: Mood is anxious.        Speech: Speech is rapid and pressured.        Behavior: Behavior is withdrawn.        Thought Content: Thought content is paranoid. Thought content does not include homicidal or suicidal ideation. Thought content does not include homicidal or suicidal plan.        Cognition and Memory: Cognition and memory normal.    Review of Systems  Psychiatric/Behavioral: Positive for hallucinations. The patient is nervous/anxious.   All other systems reviewed and are negative.  Blood pressure (!) 91/58, pulse (!) 109, temperature 98.2 F (36.8 C), temperature source Oral, resp. rate 18, height 5\' 9"  (1.753 m), weight 73.5 kg, SpO2 99 %. Body mass index is 23.92 kg/m.  Treatment Plan Summary: Daily contact with patient to assess and evaluate symptoms and progress in treatment and Medication management  Observation Level/Precautions:  Continuous Observation  Laboratory:  CBC Chemistry Profile:  -monitor Na values; (132) 07/17/20, (134)07/16/20, (131) 07/15/20; repeat 07/18/20 -LFT's: AST (35) 07/17/20, ALT (53) 07/17/20; continue to monitor   Psychotherapy:  none  Medications:  Hydroxyzine 25 mg prn  Consultations:    Discharge Concerns:   Estimated LOS: 3-5 days  Other:     Physician Treatment Plan for Primary  Diagnosis: Substance-induced psychotic disorder (HCC) Long Term Goal(s): Improvement in symptoms so as ready for discharge  Short Term Goals: Ability to identify changes in lifestyle to reduce recurrence of condition will improve, Ability to identify and develop effective coping behaviors will improve and Compliance with prescribed medications will improve  Physician Treatment Plan for Secondary Diagnosis: Principal Problem:   Substance-induced psychotic disorder (HCC) Active Problems:   MDD (major depressive disorder)   Major depressive disorder, recurrent severe without psychotic features (HCC)  Long Term Goal(s): Improvement in symptoms so as ready for discharge  Short Term Goals: Ability to identify changes in lifestyle to reduce recurrence of condition will improve, Ability to disclose and discuss suicidal ideas and Ability to demonstrate self-control will improve  I certify that inpatient services furnished can reasonably be expected to improve the patient's condition.    07/19/20, NP 4/30/20225:29 PM

## 2020-07-17 NOTE — Progress Notes (Signed)
   07/17/20 1000  Psych Admission Type (Psych Patients Only)  Admission Status Voluntary  Psychosocial Assessment  Patient Complaints Anxiety  Eye Contact Brief  Facial Expression Anxious  Affect Anxious  Speech Logical/coherent  Interaction Assertive;Guarded  Motor Activity Slow  Appearance/Hygiene Disheveled  Behavior Characteristics Cooperative  Mood Suspicious;Preoccupied  Thought Process  Coherency WDL  Content Paranoia;Preoccupation  Delusions Paranoid  Perception Hallucinations  Hallucination Auditory;Visual  Judgment Impaired  Confusion None  Danger to Self  Current suicidal ideation? Denies  Danger to Others  Danger to Others None reported or observed

## 2020-07-17 NOTE — Progress Notes (Signed)
Pt did not attend orientation group.  

## 2020-07-17 NOTE — BHH Group Notes (Signed)
.  Psychoeducational Group Note  Date: 07/17/2020 Time: 0900-1000    Goal Setting   Purpose of Group: This group helps to provide patients with the steps of setting [name] goal that is specific, measurable, attainable, realistic and time specific. [name] discussion on how we keep ourselves stuck with negative self talk.    Participation Level:  Did not attend   Jerry [name]  

## 2020-07-17 NOTE — BHH Suicide Risk Assessment (Addendum)
[facility] Admission Suicide Risk Assessment   Nursing information obtained from:  Patient Demographic factors:  Male,Low socioeconomic status,Unemployed Current Mental Status:  Self-harm thoughts Loss Factors:  Loss of significant relationship,Legal issues,Decline in physical health,Financial problems / change in socioeconomic status Historical Factors:  Substance use prior to admission; previous suicide attempt Risk Reduction Factors:    Total Time Spent in Direct Patient Care:  I personally spent 45 minutes on the unit in direct patient care. The direct patient care time included face-to-face time with the patient, reviewing the patient's chart, communicating with other professionals, and coordinating care. Greater than 50% of this time was spent in counseling or coordinating care with the patient regarding goals of hospitalization, psycho-education, and discharge planning needs.  Principal Problem: Schizophrenia spectrum disorder with psychotic disorder type not yet determined (HCC) Diagnosis:  Principal Problem:   Schizophrenia spectrum disorder with psychotic disorder type not yet determined (HCC) Active Problems:   Depression, unspecified   Methamphetamine abuse (HCC)   Marijuana abuse   Cocaine abuse (HCC)  Subjective Data: Patient is a 45y/o male with h/o polysubstance abuse and alcohol abuse, who presented to [facility] via EMS on 07/15/20 for management of paranoia and AVH. The patient is unsure of his past psychiatric diagnoses but states that a year ago he was on Risperdal (unknown dose) and Trazodone. Per his ED notes, he previously reported having up to 7 past psychiatric admissions with last hospitalization 1 year ago in [place]. He states that he has recently been paranoid and believing that police and other people are after him. He admits to internal AH telling him to kill himself and making derogatory statements about him. He reports VH of "seeing people." He denies first rank symptoms, ideas  of reference, or magical thinking. He states that he previously attempted suicide once in the past by jumping in front of car. He endorses recent depressed mood with associated poor sleep, poor appetite, poor focus, and low concentration. He states he is unemployed and has psychosocial stress related to housing issues/homelessness. He admits to intermittent THC use but is vague as to the pattern or amount of recent use. He states he "got with some people" and snorted methamphetamines once prior to admission which has made his underlying paranoia worse. Per his ED notes he has a h/o past cocaine use as well as past abuse of opiates. He admits to drinking 4-6 40oz beers per day with an alcohol abuse history over the last 10 years. He denies current SI and denies HI. He has difficulty answering questions related to potential past manic/hypomanic episodes and provides vague history. See H&P for additional details.    Continued Clinical Symptoms:  Alcohol Use Disorder Identification Test Final Score (AUDIT): 0 The "Alcohol Use Disorders Identification Test", Guidelines for Use in Primary Care, Second Edition.  World Science writer [facility]). Score between 0-7:  no or low risk or alcohol related problems. Score between 8-15:  moderate risk of alcohol related problems. Score between 16-19:  high risk of alcohol related problems. Score 20 or above:  warrants further diagnostic evaluation for alcohol dependence and treatment.  CLINICAL FACTORS:   Depression:   Insomnia More than one psychiatric diagnosis Currently Psychotic Previous Psychiatric Diagnoses and Treatments   Musculoskeletal: Strength & Muscle Tone: within normal limits Gait & Station: normal Patient leans: N/A  Psychiatric Specialty Exam: Physical Exam Vitals reviewed.  HENT:     Head: Normocephalic.     Comments: Appears to have had some presumed left sided  facial fractures/asymmetric orbits on exam Pulmonary:     Effort: Pulmonary  effort is normal.  Neurological:     Mental Status: He is alert.     Review of Systems - see H&P  Blood pressure 90/63, pulse (!) 120, temperature 98.2 F (36.8 C), temperature source Oral, resp. rate 18, height 5\' 9"  (1.753 m), weight 73.5 kg, SpO2 100 %.Body mass index is 23.92 kg/m.  General Appearance: disheveled appearing, appears older than stated age  Eye Contact:  Fair  Speech:  Rambling, rapid but not pressured, normal fluency  Volume:  Normal  Mood:  Anxious and Dysphoric  Affect:  Guarded at times tearful  Thought Process:  Superficially goal directed and linear but at times circumstantial  Orientation:  Full (Time, Place, and Person)  Thought Content:  endorses AVH and paranoia; does not appear to be grossly responding to intental/external stimuli on exam but appears paranoid ; denies ideas of reference or first rank symptoms  Suicidal Thoughts:  denies current SI, intent or plan  Homicidal Thoughts:  No  Memory:  Recent - fair  Judgement: Poor  Insight:  Lacking  Psychomotor Activity:  Fidgety on exam  Concentration:  Concentration: Fair and Attention Span: Fair  Recall:  of Knowledge:  Fair  Language:  Good  Akathisia:  Negative  Assets:  Communication Skills Desire for Improvement Resilience  ADL's:  independent  Cognition:  WNL  Sleep:  Number of Hours: 8.75   COGNITIVE FEATURES THAT CONTRIBUTE TO RISK:  Polarized thinking    SUICIDE RISK:   Moderate:  Frequent suicidal ideation with limited intensity, and duration, some specificity in terms of plans, no associated intent, good self-control, limited dysphoria/symptomatology, some risk factors present, and identifiable protective factors, including available and accessible social support.  PLAN OF CARE: Patient was admitted voluntarily to inpatient psychiatry. His differential is highly suspicious for a substance induced psychotic d/o given his drug abuse history and positive drug screen, but a  primary psychotic d/o vs MDD with psychotic features cannot be excluded. We will attempt to additional collateral history for diagnostic clarification. Admission labs reviewed: CBC WNL; ETOH <10; UDS positive for cocaine, amphetamines, and THC; CMP WNL except for  Na+ 132 down from 134, glucose 109, total protein 8.4, total bili 1.4, and ALT 53 down from 61; lipid panel WNL except for LDL 100; A1c 5.3; TSH 0.751; respiratory panel negative; EKG shows NSR 93bpm with rightward axis and minimal voltage criteria for LVH and QTc [employer]; Will repeat CMP for trending of Na+ and liver panel tomorrow. Patient will be placed on CIWA protocol with oral thiamine and MVI replacement and Ativan 1mg  for CIWA score >10; He will be started on Risperdal 1mg  po qhs. Patient may benefit from start of an antidepressant - will monitor and reassess.   I certify that inpatient services furnished can reasonably be expected to improve the patient's condition.   , MD, FAPA 07/17/2020, 8:20 PM

## 2020-07-18 DIAGNOSIS — F29 Unspecified psychosis not due to a substance or known physiological condition: Secondary | ICD-10-CM | POA: Diagnosis not present

## 2020-07-18 LAB — COMPREHENSIVE METABOLIC PANEL
ALT: 43 U/L (ref 0–44)
AST: 34 U/L (ref 15–41)
Albumin: 3.4 g/dL — ABNORMAL LOW (ref 3.5–5.0)
Alkaline Phosphatase: 42 U/L (ref 38–126)
Anion gap: 8 (ref 5–15)
BUN: 14 mg/dL (ref 6–20)
CO2: 25 mmol/L (ref 22–32)
Calcium: 9.6 mg/dL (ref 8.9–10.3)
Chloride: 105 mmol/L (ref 98–111)
Creatinine, Ser: 0.89 mg/dL (ref 0.61–1.24)
GFR, Estimated: >60 mL/min (ref >60)
Glucose, Bld: 163 mg/dL — ABNORMAL HIGH (ref 70–99)
Potassium: 4.3 mmol/L (ref 3.5–5.1)
Sodium: 138 mmol/L (ref 135–145)
Total Bilirubin: 0.5 mg/dL (ref 0.3–1.2)
Total Protein: 8.1 g/dL (ref 6.5–8.1)

## 2020-07-18 MED ORDER — GABAPENTIN 100 MG PO CAPS
100.0000 mg | ORAL_CAPSULE | Freq: Three times a day (TID) | ORAL | Status: DC
  Administered 2020-07-18 – 2020-07-21 (×9): 100 mg via ORAL
  Filled 2020-07-18 (×13): qty 1

## 2020-07-18 NOTE — Progress Notes (Signed)
Adult Psychoeducational Group Note  Date:  07/18/2020 Time:  10:44 PM  Group Topic/Focus:  Wrap-Up Group:   The focus of this group is to help patients review their daily goal of treatment and discuss progress on daily workbooks.  Participation Level:  Active  Participation Quality:  Appropriate  Affect:  Appropriate  Cognitive:  Appropriate  Insight: Appropriate  Engagement in Group:  Developing/Improving  Modes of Intervention:  Discussion  Additional Comments:  Pt stated his goal for today was to focus on his treatment plan. Pt stated he accomplished his goal today. Pt stated he did not get a chances to talked with his doctor or his social worker about his care today. Pt stated the goal for tomorrow was to talk with his doctor about discharge and his social worker about aftercare treatment. Pt rated his overall day a 8 out of 10. Pt stated he made no calls today. Pt stated he felt better about himself today. Pt stated he attend all groups held today. Pt stated he was able to attend all meals. Pt stated he took all medications provided today. Pt stated his appetite was good today. Pt rated sleep last night was pretty good. Pt stated the goal tonight was to get some rest. Pt stated he was in no physical pain tonight. Pt deny visual hallucinations and auditory issues tonight.  Pt denies thoughts of harming himself or others. Pt stated he would alert staff if anything changed.   Jerry [name] 07/18/2020, 10:44 PM

## 2020-07-18 NOTE — Progress Notes (Addendum)
[facility] MD Progress Note  07/18/2020 10:31 AM Jerry [name]  MRN:  [id number]  Principal Problem: Schizophrenia spectrum disorder with psychotic disorder type not yet determined (HCC) Diagnosis: Principal Problem:   Schizophrenia spectrum disorder with psychotic disorder type not yet determined (HCC) Active Problems:   Depression, unspecified   Methamphetamine abuse (HCC)   Marijuana abuse   Cocaine abuse (HCC)  Subjective:   "Just laying here and then my anxiety just shot up high. I'm going to be honest. I don't want another program. When I leave here, don't send me to inpatient or another program. Just send me back to the street. I can't go anywhere because I have to smoke so if it comes down to the street or in here, send me to the street".   Objective:  Jerry [name] is a 45 year old male with a history of bipolar disorder and polysubstance abuse who presented to [facility] on 07/15/20 via EMS after being found in [employer] parking lot wandering and talking to himself. Patient reported having auditory hallucinations and using meth 4 days prior without any sleep since; he is currently homeless. UDS+ amphetamines, cocaine, and THC. BAL<10,    Assessment:  On assessment patient initially presented pacing hallway. Patient reports 8/10 anxiety today; provider discussed prn medications available, patient states he "just took something". Patient continues to endorse auditory hallucinations stating he "keeps hearing things, it's like a playbook in my head. It's not all the time. It's not every single day and it ain't all bad either. I don't want to hurt myself or nobody". Patient is complaining of nicotine withdrawal; currently wearing NicoDerm CQ patch. He denies any suicidal or homicidal ideations. Patient presents anxious and restless. He continues to endorse auditory hallucinations with some paranoia. Denies any visual hallucinations and does not appear to be responding to any external/internal stimuli  at this time. He endorses recent "meth" and alcohol use; UDS+ amphetamine, cocaine, and THC. BAL <10. Gabapentin 100 mg TID ordered to address possible withdrawal symptoms. Agitation protocol in place.   Total Time spent with patient: 30 minutes  Past Psychiatric History: See H&P  Past Medical History:  Past Medical History:  Diagnosis Date  . Anxiety   . Bipolar 1 disorder (HCC)   . Depression   . Hallucination   . PTSD (post-traumatic stress disorder)     Past Surgical History:  Procedure Laterality Date  . KIDNEY DONATION Right 2003   Family History:  Family History  Problem Relation Age of Onset  . Heart disease Mother   . Cancer Mother   . Heart disease Father   . Diabetes Father   . Diabetes Maternal Grandmother   . Stroke Paternal Grandfather    Family Psychiatric  History: not noted Social History:  Social History   Substance and Sexual Activity  Alcohol Use No  . Alcohol/week: 32.0 standard drinks  . Types: 12 Cans of beer, 20 Shots of liquor per week   Comment:  last use was 1 month ago      Social History   Substance and Sexual Activity  Drug Use Yes  . Types: Marijuana, Cocaine, Heroin, Amphetamines   Comment: history of IVDA    Social History   Socioeconomic History  . Marital status: Married    Spouse name: Not on file  . Number of children: Not on file  . Years of education: Not on file  . Highest education level: Not on file  Occupational History  .  Not on file  Tobacco Use  . Smoking status: Current Every Day Smoker    Packs/day: 2.00    Types: Cigarettes  . Smokeless tobacco: Never Used  Substance and Sexual Activity  . Alcohol use: No    Alcohol/week: 32.0 standard drinks    Types: 12 Cans of beer, 20 Shots of liquor per week    Comment:  last use was 1 month ago   . Drug use: Yes    Types: Marijuana, Cocaine, Heroin, Amphetamines    Comment: history of IVDA  . Sexual activity: Yes  Other Topics Concern  . Not on file  Social  History Narrative  . Not on file   Social Determinants of Health   Financial Resource Strain: Not on file  Food Insecurity: Not on file  Transportation Needs: Not on file  Physical Activity: Not on file  Stress: Not on file  Social Connections: Not on file   Additional Social History:   Sleep: Fair  Appetite:  Fair  Current Medications: Current Facility-Administered Medications  Medication Dose Route Frequency Provider Last Rate Last Admin  . acetaminophen (TYLENOL) tablet 650 mg  650 mg Oral Q6H PRN Jaclyn Shaggyaylor, Cody W, PA-C      . alum & mag hydroxide-simeth (MAALOX/MYLANTA) 200-200-20 MG/5ML suspension 30 mL  30 mL Oral Q4H PRN Melbourne Abtsaylor, Cody W, PA-C      . hydrOXYzine (ATARAX/VISTARIL) tablet 25 mg  25 mg Oral Q6H PRN Mason JimSingleton, Mohini Heathcock E, MD      . loperamide (IMODIUM) capsule 2-4 mg  2-4 mg Oral PRN Mason JimSingleton, Keirstin Musil E, MD      . OLANZapine zydis (ZYPREXA) disintegrating tablet 5 mg  5 mg Oral Q8H PRN Leevy-Johnson, Brooke A, NP   5 mg at 07/17/20 1942   And  . LORazepam (ATIVAN) tablet 1 mg  1 mg Oral PRN Leevy-Johnson, Brooke A, NP       And  . ziprasidone (GEODON) injection 20 mg  20 mg Intramuscular PRN Leevy-Johnson, Brooke A, NP      . LORazepam (ATIVAN) tablet 1 mg  1 mg Oral Q6H PRN Mason JimSingleton, Raiden Yearwood E, MD      . magnesium hydroxide (MILK OF MAGNESIA) suspension 30 mL  30 mL Oral Daily PRN Melbourne Abtsaylor, Cody W, PA-C      . multivitamin with minerals tablet 1 tablet  1 tablet Oral Daily Mason JimSingleton, Henrry Feil E, MD   1 tablet at 07/18/20 0929  . nicotine (NICODERM CQ - dosed in mg/24 hours) patch 21 mg  21 mg Transdermal Daily Comer LocketSingleton, Pascual Mantel E, MD   21 mg at 07/18/20 [id number]  . ondansetron (ZOFRAN-ODT) disintegrating tablet 4 mg  4 mg Oral Q6H PRN Mason JimSingleton, Odus Clasby E, MD      . risperiDONE (RISPERDAL) tablet 1 mg  1 mg Oral QHS Bartholomew CrewsSingleton, Miciah Shealy E, MD   1 mg at 07/17/20 2024  . thiamine tablet 100 mg  100 mg Oral Daily Bartholomew CrewsSingleton, Aletha Allebach E, MD   100 mg at 07/18/20 0929  . traZODone (DESYREL) tablet 50 mg  50  mg Oral QHS PRN Jaclyn Shaggyaylor, Cody W, PA-C   50 mg at 07/17/20 1942   Lab Results:  Results for orders placed or performed during the hospital encounter of 07/16/20 (from the past 48 hour(s))  Comprehensive metabolic panel     Status: Abnormal   Collection Time: 07/17/20  6:36 AM  Result Value Ref Range   Sodium 132 (L) 135 - 145 mmol/L   Potassium 3.9 3.5 - 5.1  mmol/L   Chloride 100 98 - 111 mmol/L   CO2 22 22 - 32 mmol/L   Glucose, Bld 109 (H) 70 - 99 mg/dL    Comment: Glucose reference range applies only to samples taken after fasting for at least 8 hours.   BUN 12 6 - 20 mg/dL   Creatinine, Ser 3.54 0.61 - 1.24 mg/dL   Calcium 9.4 8.9 - 56.2 mg/dL   Total Protein 8.4 (H) 6.5 - 8.1 g/dL   Albumin 3.6 3.5 - 5.0 g/dL   AST 35 15 - 41 U/L   ALT 53 (H) 0 - 44 U/L   Alkaline Phosphatase 46 38 - 126 U/L   Total Bilirubin 1.4 (H) 0.3 - 1.2 mg/dL   GFR, Estimated >56 >38 mL/min    Comment: (NOTE) Calculated using the CKD-EPI Creatinine Equation (2021)    Anion gap 10 5 - 15    Comment: Performed at [facility], [address], [place], [place] [address]  Lipid panel     Status: Abnormal   Collection Time: 07/17/20  6:36 AM  Result Value Ref Range   Cholesterol 173 0 - 200 mg/dL   Triglycerides 287 <681 mg/dL   HDL 52 >15 mg/dL   Total CHOL/HDL Ratio 3.3 RATIO   VLDL 21 0 - 40 mg/dL   LDL Cholesterol 726 (H) 0 - 99 mg/dL    Comment:        Total Cholesterol/HDL:CHD Risk Coronary Heart Disease Risk Table                     Men   Women  1/2 Average Risk   3.4   3.3  Average Risk       5.0   4.4  2 X Average Risk   9.6   7.1  3 X Average Risk  23.4   11.0        Use the calculated Patient Ratio above and the CHD Risk Table to determine the patient's CHD Risk.        ATP III CLASSIFICATION (LDL):  <100     mg/dL   Optimal  203-559  mg/dL   Near or Above                    Optimal  130-159  mg/dL   Borderline  741-638  mg/dL   High  >453     mg/dL   Very  High Performed at [facility], [address] [place], [place] [address]   Hemoglobin A1c     Status: None   Collection Time: 07/17/20  6:36 AM  Result Value Ref Range   Hgb A1c MFr Bld 5.3 4.8 - 5.6 %    Comment: (NOTE) Pre diabetes:          5.7%-6.4%  Diabetes:              >6.4%  Glycemic control for   <7.0% adults with diabetes    Mean Plasma Glucose 105.41 mg/dL    Comment: Performed at [facility], [address] [place], [place] [address]  TSH     Status: None   Collection Time: 07/17/20  6:36 AM  Result Value Ref Range   TSH 0.751 0.350 - 4.500 uIU/mL    Comment: Performed by a 3rd Generation assay with a functional sensitivity of <=0.01 uIU/mL. Performed at [facility], [address] [place], [place] [address]  Comprehensive metabolic panel     Status: Abnormal   Collection Time: 07/18/20  6:40 AM  Result Value Ref Range   Sodium 138 135 - 145 mmol/L   Potassium 4.3 3.5 - 5.1 mmol/L   Chloride 105 98 - 111 mmol/L   CO2 25 22 - 32 mmol/L   Glucose, Bld 163 (H) 70 - 99 mg/dL    Comment: Glucose reference range applies only to samples taken after fasting for at least 8 hours.   BUN 14 6 - 20 mg/dL   Creatinine, Ser 6.56 0.61 - 1.24 mg/dL   Calcium 9.6 8.9 - 81.2 mg/dL   Total Protein 8.1 6.5 - 8.1 g/dL   Albumin 3.4 (L) 3.5 - 5.0 g/dL   AST 34 15 - 41 U/L   ALT 43 0 - 44 U/L   Alkaline Phosphatase 42 38 - 126 U/L   Total Bilirubin 0.5 0.3 - 1.2 mg/dL   GFR, Estimated >75 >17 mL/min    Comment: (NOTE) Calculated using the CKD-EPI Creatinine Equation (2021)    Anion gap 8 5 - 15    Comment: Performed at [facility], [address] [place], [place] [address]   Blood Alcohol level:  Lab Results  Component Value Date   [facility] <10 07/15/2020   ETH <11 05/18/2013   Metabolic Disorder Labs: Lab Results  Component Value Date   HGBA1C 5.3 07/17/2020   MPG 105.41 07/17/2020   MPG 108  06/25/2014   No results found for: PROLACTIN Lab Results  Component Value Date   CHOL 173 07/17/2020   TRIG 106 07/17/2020   HDL 52 07/17/2020   CHOLHDL 3.3 07/17/2020   VLDL 21 07/17/2020   LDLCALC 100 (H) 07/17/2020   Physical Findings: AIMS: Facial and Oral Movements Muscles of Facial Expression: None, normal Lips and Perioral Area: None, normal Jaw: None, normal Tongue: None, normal,Extremity Movements Upper (arms, wrists, hands, fingers): None, normal Lower (legs, knees, ankles, toes): None, normal, Trunk Movements Neck, shoulders, hips: None, normal, Overall Severity Severity of abnormal movements (highest score from questions above): None, normal Incapacitation due to abnormal movements: None, normal Patient's awareness of abnormal movements (rate only patient's report): No Awareness, Dental Status Current problems with teeth and/or dentures?: No Does patient usually wear dentures?: No  CIWA:  CIWA-Ar Total: 4 COWS:  COWS Total Score: 2  Musculoskeletal: Strength & Muscle Tone: within normal limits Gait & Station: normal Patient leans: N/A  Psychiatric Specialty Exam:  Presentation  General Appearance: Casual  Eye Contact:Fair  Speech:Pressured  Speech Volume:Normal  Handedness:No data recorded  Mood and Affect  Mood:Anxious  Affect:Congruent  Thought Process  Thought Processes:Coherent  Descriptions of Associations:Tangential  Orientation:Full (Time, Place and Person)  Thought Content:Tangential  History of Schizophrenia/Schizoaffective disorder:No data recorded Duration of Psychotic Symptoms:No data recorded Hallucinations:Hallucinations: Auditory Description of Auditory Hallucinations: "playbook in my mind"  Ideas of Reference:None  Suicidal Thoughts:Suicidal Thoughts: No  Homicidal Thoughts:Homicidal Thoughts: No  Sensorium  Memory:Immediate Fair; Recent Fair; Remote Fair  Judgment:Poor  Insight:Shallow  Executive Functions   Concentration:Fair  Attention Span:Poor  Recall:Fair  Fund of Knowledge:Fair  Language:Fair  Psychomotor Activity  Psychomotor Activity:Psychomotor Activity: Increased  Assets  Assets:Physical Health; Resilience  Sleep  Sleep:Sleep: Fair  Physical Exam: Physical Exam Vitals and nursing note reviewed.  Psychiatric:        Attention and Perception: He perceives auditory hallucinations.        Mood and Affect: Mood is anxious.  Speech: Speech is rapid and pressured.        Behavior: Behavior is cooperative.        Thought Content: Thought content is paranoid. Thought content does not include homicidal or suicidal ideation. Thought content does not include homicidal or suicidal plan.        Judgment: Judgment is impulsive.    Review of Systems  Psychiatric/Behavioral: Positive for hallucinations and substance abuse. The patient is nervous/anxious.   All other systems reviewed and are negative.  Blood pressure (!) 87/62, pulse (!) 102, temperature 97.7 F (36.5 C), temperature source Oral, resp. rate 18, height 5\' 9"  (1.753 m), weight 73.5 kg, SpO2 100 %. Body mass index is 23.92 kg/m.  Treatment Plan Summary: Daily contact with patient to assess and evaluate symptoms and progress in treatment and Medication management   Observation Level/Precautions:  Continuous Observation  Laboratory:  CBC Chemistry Profile:  -monitor Na values; (138) 07/18/20, (132) 07/17/20, (134)07/16/20, (131) 07/15/20;  -LFT's: AST (35) 07/17/20, ALT (53) 07/17/20; continue to monitor   Psychotherapy:  none  Medications:  Hydroxyzine 25 mg prn  Consultations:    Discharge Concerns:   Estimated LOS: 3-5 days  Other:     Physician Treatment Plan for Primary Diagnosis: Substance-induced psychotic disorder (HCC)  PLAN: 1. Safety and Monitoring:             -- Voluntary admission to inpatient psychiatric unit for safety,   stabilization and treatment             -- Daily contact  with patient to assess and evaluate symptoms   and progress in treatment             -- Patient's case to be discussed in multi-disciplinary team   meeting             -- Observation Level : q15 minute checks             -- Vital signs:  q12 hours  2.     Psychiatric Diagnoses and Treatment:        --Schizophrenia spectrum disorder with psychotic   disorder type not yet determined (HCC)  -- Risperidone 1 mg initiated for psychotic features including   auditory hallucinations.     -- Methamphetamine abuse (HCC)   --UDS positive for amphetamine  -- Agitation protocol orders in place for agitation management  -- Patient counseled on the need to abstain from illicit drug use   after discharge  --Declining any treatment for use.              -- Short Term Goals: Ability to identify triggers associated with   substance abuse/mental health issues will improve             -- Long Term Goals: Improvement in symptoms so as ready for   discharge    -- Cocaine abuse (HCC)  --UDS positive for cocaine  -- Gabapentin 100mg  TID initiated for possible withdrawal   symptoms - monitoring kidney function on this medication (creatinine 0.89)  -- Agitation protocol orders in place for agitation management  -- Patient counseled on the need to abstain from illicit drug use   after discharge             -- Short Term Goals: Ability to identify triggers associated with   substance abuse/mental health issues will improve             -- Long Term Goals: Improvement in symptoms so as ready  for   discharge     -- Marijuana abuse             -- UDS positive for [facility]             -- Patient counseled on the need to abstain from illicit drug use   after discharge             -- Short Term Goals: Ability to identify triggers associated with   substance abuse/mental health issues will improve             -- Long Term Goals: Improvement in symptoms so as ready for   discharge   CMP repeated 07/18/20: Na+ 138  (improved from 132), creatinine 0.89, AST 34, ALT 43 (down from 53), glucose 163, Albumin 3.4 otherwise all WNL  Long Term Goal(s): Improvement in symptoms so as ready for discharge  Short Term Goals: Ability to identify changes in lifestyle to reduce recurrence of condition will improve, Ability to identify and develop effective coping behaviors will improve and Compliance with prescribed medications will improve  I certify that inpatient services furnished can reasonably be expected to improve the patient's condition.   Loletta Parish, NP 07/18/2020, 10:31 AM

## 2020-07-18 NOTE — Progress Notes (Signed)
Nursing Note: 0700-1900  D:  Pt reports that hearing no voices today, "Not one voice today, but usually I hear the voice telling me to shoot myself with a shotgun, it is like a story book-the way I hear it."   CIWA- 7 today. Reports that he slept well last night, appetite is fair and that he is tolerating prescribed medication."  Neurontin 100mg  PO TID stated today.  A:  Encouraged to verbalize needs and concerns, active listening and support provided.  Continued Q 15 minute safety checks.  Observed active participation in group settings.  R:  Pt. is pleasant and cooperative, shared that he is paranoid at times but is feeling better overall.  Denies current A/V hallucinations and is able to verbally contract for safety.    07/18/20 0800  Psych Admission Type (Psych Patients Only)  Admission Status Voluntary  Psychosocial Assessment  Patient Complaints Anxiety  Eye Contact Fair  Facial Expression Anxious;Worried  Affect Anxious;Appropriate to circumstance;Depressed  Speech Logical/coherent  Interaction Assertive  Motor Activity Fidgety  Appearance/Hygiene Poor hygiene;Disheveled  Behavior Characteristics Cooperative;Anxious;Fidgety  Mood Anxious;Suspicious;Pleasant  Thought Process  Coherency WDL  Content Preoccupation;Paranoia  Delusions Paranoid  Perception Hallucinations ("Not currently.")  Hallucination None reported or observed  Judgment Impaired  Confusion None  Danger to Self  Current suicidal ideation? Denies  Danger to Others  Danger to Others None reported or observed  [place] NOVEL CORONAVIRUS (COVID-19) DAILY CHECK-OFF SYMPTOMS - answer yes or no to each - every day NO YES  Have you had a fever in the past 24 hours?  Fever (Temp > 37.80C / 100F) X   Have you had any of these symptoms in the past 24 hours? New Cough  Sore Throat   Shortness of Breath  Difficulty Breathing  Unexplained Body Aches   X   Have you had any one of these symptoms in the past 24  hours not related to allergies?   Runny Nose  Nasal Congestion  Sneezing   X   If you have had runny nose, nasal congestion, sneezing in the past 24 hours, has it worsened?  X   EXPOSURES - check yes or no X   Have you traveled outside the state in the past 14 days?  X   Have you been in contact with someone with a confirmed diagnosis of COVID-19 or PUI in the past 14 days without wearing appropriate PPE?  X   Have you been living in the same home as a person with confirmed diagnosis of COVID-19 or a PUI (household contact)?    X   Have you been diagnosed with COVID-19?    X              What to do next: Answered NO to all: Answered YES to anything:   Proceed with unit schedule Follow the [facility] Inpatient Flowsheet.

## 2020-07-18 NOTE — [facility] Group Notes (Signed)
Adult Psychoeducational Group Not Date:  07/18/2020 Time:  0900-1045 Group Topic/Focus: PROGRESSIVE RELAXATION. A group where deep breathing is taught and tensing and relaxation muscle groups is used. Imagery is used as well.  Pts are asked to imagine 3 pillars that hold them up when they are not able to hold themselves up.  Participation Level:  Active  Participation Quality:  Appropriate  Affect:  Appropriate  Cognitive:  Oriented  Insight: Improving  Engagement in Group:  Engaged  Modes of Intervention:  Activity, Discussion, Education, and Support  Additional Comments:  Pt rates his energy at a 5. States his pillars are himself , his God and his control. Was argumentative in the beginning of the group, wanting to know why he had to be in it. Was able to settle down after being heard and praised for his input.  Dione Housekeeper

## 2020-07-19 NOTE — Plan of Care (Signed)
Patient stayed in the dayroom and attended group. Presented to the medication window anxious, sad and helpless. Denied SI/HI. Appeared to be preoccupied with blocking thoughts. Received HS medications and went to his room. Currently sleeping and safety maintained.

## 2020-07-19 NOTE — Progress Notes (Signed)
Recreation Therapy Notes  INPATIENT RECREATION THERAPY ASSESSMENT  Patient Details Name: Jerry [name] MRN: [id number] DOB: 1975/06/19 Today's Date: 07/19/2020       Information Obtained From: Patient  Able to Participate in Assessment/Interview: Yes  Patient Presentation: Alert,Anxious  Reason for Admission (Per Patient): Other (Comments) (Pt stated he thought people were trying to kill him after taking meth.  Pt stated this was his first time taking it and didn't know what it was.)  Patient Stressors: Relationship,Family (Kids)  Coping Skills:   TV,Sports,Music  Leisure Interests (2+):   (Pt stated he doesn't have any.)  Frequency of Recreation/Participation:    Awareness of Community Resources:  Yes  Community Resources:  Psychologist, prison and probation services (Comment) (Stores)  Current Use: Yes  If no, Barriers?:    Expressed Interest in [employer] Information: No  County of Residence:  [place]  Patient Main Form of Transportation: Publishing copy also)  Patient Strengths:  "God"  Patient Identified Areas of Improvement:  Relationship; Kids  Patient Goal for Hospitalization:  "to go home"  Current SI (including self-harm):  No  Current HI:  No  Current AVH: No  Staff Intervention Plan: Group Attendance,Collaborate with Interdisciplinary Treatment Team  Consent to Intern Participation: N/A     Caroll Rancher, LRT/CTRS   Caroll Rancher A 07/19/2020, 2:17 PM

## 2020-07-19 NOTE — Tx Team (Signed)
Interdisciplinary Treatment and Diagnostic Plan Update  07/19/2020 Time of Session: 3:30pm RODEL GLASPY MRN: [id number]  Principal Diagnosis: Schizophrenia spectrum disorder with psychotic disorder type not yet determined [facility])  Secondary Diagnoses: Principal Problem:   Schizophrenia spectrum disorder with psychotic disorder type not yet determined ([place]) Active Problems:   Depression, unspecified   Methamphetamine abuse ([place])   Marijuana abuse   Cocaine abuse ([place])   Current Medications:  Current Facility-Administered Medications  Medication Dose Route Frequency Provider Last Rate Last Admin  . acetaminophen (TYLENOL) tablet 650 mg  650 mg Oral Q6H PRN Prescilla Sours, PA-C      . alum & mag hydroxide-simeth (MAALOX/MYLANTA) 200-200-20 MG/5ML suspension 30 mL  30 mL Oral Q4H PRN Margorie John W, PA-C      . gabapentin (NEURONTIN) capsule 100 mg  100 mg Oral TID Leevy-Johnson, Brooke A, NP   100 mg at 07/19/20 1354  . hydrOXYzine (ATARAX/VISTARIL) tablet 25 mg  25 mg Oral Q6H PRN Nelda Marseille, Amy E, MD      . loperamide (IMODIUM) capsule 2-4 mg  2-4 mg Oral PRN Nelda Marseille, Amy E, MD      . OLANZapine zydis (ZYPREXA) disintegrating tablet 5 mg  5 mg Oral Q8H PRN Leevy-Johnson, Brooke A, NP   5 mg at 07/17/20 1942   And  . LORazepam (ATIVAN) tablet 1 mg  1 mg Oral PRN Leevy-Johnson, Brooke A, NP       And  . ziprasidone (GEODON) injection 20 mg  20 mg Intramuscular PRN Leevy-Johnson, Brooke A, NP      . LORazepam (ATIVAN) tablet 1 mg  1 mg Oral Q6H PRN Nelda Marseille, Amy E, MD      . magnesium hydroxide (MILK OF MAGNESIA) suspension 30 mL  30 mL Oral Daily PRN Margorie John W, PA-C      . multivitamin with minerals tablet 1 tablet  1 tablet Oral Daily Nelda Marseille, Amy E, MD   1 tablet at 07/19/20 0817  . nicotine (NICODERM CQ - dosed in mg/24 hours) patch 21 mg  21 mg Transdermal Daily Harlow Asa, MD   21 mg at 07/19/20 0817  . ondansetron (ZOFRAN-ODT) disintegrating tablet 4 mg  4 mg Oral  Q6H PRN Nelda Marseille, Amy E, MD      . risperiDONE (RISPERDAL) tablet 1 mg  1 mg Oral QHS Viann Fish E, MD   1 mg at 07/18/20 2044  . thiamine tablet 100 mg  100 mg Oral Daily Viann Fish E, MD   100 mg at 07/19/20 0817  . traZODone (DESYREL) tablet 50 mg  50 mg Oral QHS PRN Prescilla Sours, PA-C   50 mg at 07/18/20 2044   PTA Medications: No medications prior to admission.    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   Physician Treatment Plan for Primary Diagnosis: Schizophrenia spectrum disorder with psychotic disorder type not yet determined ([place]) Long Term Goal(s): Improvement in symptoms so as ready for discharge Improvement in symptoms so as ready for discharge   Short Term Goals: Ability to identify changes in lifestyle to reduce recurrence of condition will improve Ability to identify and develop effective coping behaviors will improve Compliance with prescribed medications will improve Ability to identify changes in lifestyle to reduce recurrence of condition will improve Ability  to disclose and discuss suicidal ideas Ability to demonstrate self-control will improve  Medication Management: Evaluate patient's response, side effects, and tolerance of medication regimen.  Therapeutic Interventions: 1 to 1 sessions, Unit Group sessions and Medication administration.  Evaluation of Outcomes: Not Met  Physician Treatment Plan for Secondary Diagnosis: Principal Problem:   Schizophrenia spectrum disorder with psychotic disorder type not yet determined ([place]) Active Problems:   Depression, unspecified   Methamphetamine abuse ([place])   Marijuana abuse   Cocaine abuse  ([place])  Long Term Goal(s): Improvement in symptoms so as ready for discharge Improvement in symptoms so as ready for discharge   Short Term Goals: Ability to identify changes in lifestyle to reduce recurrence of condition will improve Ability to identify and develop effective coping behaviors will improve Compliance with prescribed medications will improve Ability to identify changes in lifestyle to reduce recurrence of condition will improve Ability to disclose and discuss suicidal ideas Ability to demonstrate self-control will improve     Medication Management: Evaluate patient's response, side effects, and tolerance of medication regimen.  Therapeutic Interventions: 1 to 1 sessions, Unit Group sessions and Medication administration.  Evaluation of Outcomes: Not Met   RN Treatment Plan for Primary Diagnosis: Schizophrenia spectrum disorder with psychotic disorder type not yet determined ([place]) Long Term Goal(s): Knowledge of disease and therapeutic regimen to maintain health will improve  Short Term Goals: Ability to remain free from injury will improve, Ability to participate in decision making will improve, Ability to identify and develop effective coping behaviors will improve and Compliance with prescribed medications will improve  Medication Management: RN will administer medications as ordered by provider, will assess and evaluate patient's response and provide education to patient for prescribed medication. RN will report any adverse and/or side effects to prescribing provider.  Therapeutic Interventions: 1 on 1 counseling sessions, Psychoeducation, Medication administration, Evaluate responses to treatment, Monitor vital signs and CBGs as ordered, Perform/monitor CIWA, COWS, AIMS and Fall Risk screenings as ordered, Perform wound care treatments as ordered.  Evaluation of Outcomes: Progressing   LCSW Treatment Plan for Primary Diagnosis: Schizophrenia spectrum disorder with  psychotic disorder type not yet determined ([place]) Long Term Goal(s): Safe transition to appropriate next level of care at discharge, Engage patient in therapeutic group addressing interpersonal concerns.  Short Term Goals: Engage patient in aftercare planning with referrals and resources, Increase social support, Increase ability to appropriately verbalize feelings, Identify triggers associated with mental health/substance abuse issues and Increase skills for wellness and recovery  Therapeutic Interventions: Assess for all discharge needs, 1 to 1 time with Social worker, Explore available resources and support systems, Assess for adequacy in community support network, Educate family and significant other(s) on suicide prevention, Complete Psychosocial Assessment, Interpersonal group therapy.  Evaluation of Outcomes: Not Met   Progress in Treatment: Attending groups: Yes. Participating in groups: Yes. Taking medication as prescribed: Yes. Toleration medication: Yes. Family/Significant other contact made: No, will contact:  declined consents Patient understands diagnosis: Yes. Discussing patient identified problems/goals with staff: Yes. Medical problems stabilized or resolved: Yes. Denies suicidal/homicidal ideation: Yes. Issues/concerns per patient self-inventory: No.   New problem(s) identified: No, Describe:  none  New Short Term/Long Term Goal(s): detox, medication management for mood stabilization; elimination of SI thoughts; development of comprehensive mental wellness/sobriety plan   Patient Goals: Did not attend   Discharge Plan or Barriers: Patient recently admitted. CSW will continue to follow and assess for appropriate referrals and possible discharge planning.    Reason for Continuation  of Hospitalization: Depression Medication stabilization Suicidal ideation Withdrawal symptoms  Estimated Length of Stay: 3-5 days  Attendees: Patient: Did not attend 07/19/2020 3:55 PM   Physician:  07/19/2020 3:55 PM  Nursing:  07/19/2020 3:55 PM  RN Care Manager: 07/19/2020 3:55 PM  Social Worker: Darletta Moll, [place] 07/19/2020 3:55 PM  Recreational Therapist:  07/19/2020 3:55 PM  Other:  07/19/2020 3:55 PM  Other:  07/19/2020 3:55 PM  Other: 07/19/2020 3:55 PM    Scribe for Treatment Team: Vassie Moselle, [place] 07/19/2020 3:55 PM

## 2020-07-19 NOTE — Progress Notes (Signed)
DAR NOTE: Patient presents with anxious affect and mood.  Denies pain, auditory and visual hallucinations.  Rates depression at 2, hopelessness at 0, and anxiety at 2.  Maintained on routine safety checks.  Medications given as prescribed.  Support and encouragement offered as needed.  Attended group and participated.  States goal for today is "getting out of here."  Patient observed socializing with peers in the dayroom.  Patient is safe on and off the unit.

## 2020-07-19 NOTE — BHH Group Notes (Signed)
LCSW Group Therapy Notes  Type of Therapy and Topic: Group Therapy: Healthy Vs. Unhealthy Coping Strategies  Date and Time: 1:00PM  Participation Level: BHH PARTICIPATION LEVEL: Active  Description of Group:  In this group, patients will be encouraged to explore their healthy and unhealthy coping strategics. Coping strategies are actions that we take to deal with stress, problems, or uncomfortable emotions in our daily lives. Each patient will be challenged to read some scenarios and discuss the unhealthy and healthy coping strategies within those scenarios. Also, each patient will be challenged to describe current healthy and unhealthy strategies that they use in their own lives and discuss the outcomes and barriers to those strategies. This group will be process-oriented, with patients participating in exploration of their own experiences as well as giving and receiving support and challenge from other group members.  Therapeutic Goals: 1. Patient will identify personal healthy and unhealthy coping strategies. 2. Patient will identify healthy and unhealthy coping strategies, in others, through scenarios.  3. Patient will identify expected outcomes of healthy and unhealthy coping strategies. 4. Patient will identify barriers to using healthy coping strategies.   Summary of Patient Progress: Patient attended and participated in group. Pt was seen to be playing basketball with peers and was interacting appropriately with peers. Pt shared that going on walks is a coping skill he uses.    Therapeutic Modalities:  Cognitive Behavioral Therapy Solution Focused Therapy Motivational Interviewing   Ruthann Cancer MSW, LCSW Clincal Social Worker  [facility]

## 2020-07-19 NOTE — BHH Suicide Risk Assessment (Signed)
Refusal of Consents:  Suicide Prevention Education:  Patient Refusal for Family/Significant Other Suicide Prevention Education: The patient Jerry [name] has refused to provide written consent for family/significant other to be provided Family/Significant Other Suicide Prevention Education during admission and/or prior to discharge.  Physician notified.   SPE completed with patient, as patient refused to consent to family contact. SPI pamphlet provided to pt and pt was encouraged to share information with support network, ask questions, and talk about any concerns relating to SPE. Patient denies access to guns/firearms and verbalized understanding of information provided. Mobile Crisis information also provided to patient.Ruthann Cancer MSW, LCSW Clincal Social Worker  [facility]
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
## 2020-07-19 NOTE — Progress Notes (Addendum)
Recreation Therapy Notes  Date: 5.2.22 Time: 1020 Location: 500 [address] Dayroom  Group Topic: Coping Skills  Goal Area(s) Addresses:  Patient will identify positive coping skills. Patient will identify benefit of using positive coping skills.  Behavioral Response: Minimal  Intervention: Mind Map, Pencils  Activity: Mind Map.  LRT and patients filled out the first eight boxes (jail, freedom, depression, being in a mental hospital, anxiety, trust, respect and hopelessness) together with instances in which coping skills would be used.  Pt would then get time to come up with coping skills for each instance identified.  Once pt finished, group would come back together and LRT would right coping skills on the board.  Pt would be able to fill in any blank spots on their paper if needed.  Education: Pharmacologist, Building control surveyor.   Education Outcome: Acknowledges understanding/In group clarification offered/Needs additional education.   Clinical Observations/Feedback: Pt expressed not wanting to be here.  Pt was also anxious to speak with the doctor about getting out of here.  Pt identified one coping skill as lighting a cigarette with a staple and battery while in jail.   Caroll Rancher, LRT/CTRS    Caroll Rancher A 07/19/2020 11:24 AM

## 2020-07-19 NOTE — Progress Notes (Signed)
Adult Psychoeducational Group Note  Date:  07/19/2020 Time:  9:49 PM  Group Topic/Focus:  Wrap-Up Group:   The focus of this group is to help patients review their daily goal of treatment and discuss progress on daily workbooks.  Participation Level:  Active  Participation Quality:  Appropriate  Affect:  Appropriate  Cognitive:  Appropriate  Insight: Appropriate  Engagement in Group:  Developing/Improving  Modes of Intervention:  Discussion  Additional Comments:   Pt stated his goal for today was to focus on his treatment plan. Pt stated he accomplished his goal today. Pt stated he talked with his doctor and his social worker about his care today. Pt stated he was able to talk with his doctor about discharge and his social worker about his aftercare treatment. Pt rated his overall day a 10. Pt stated he made no calls today. Pt stated he felt better about himself today. Pt stated he attend all groups held today. Pt stated he was able to attend all meals. Pt stated he took all medications provided today. Pt stated his appetite was good today. Pt rated sleep last night was pretty good. Pt stated the goal tonight was to get some rest. Pt stated he was in no physical pain tonight. Pt deny visual hallucinations and auditory issues tonight.Pt denies thoughts of harming himself or others. Pt stated he would alert staff if anything changed.   [name] 07/19/2020, 9:49 PM
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
## 2020-07-20 NOTE — Progress Notes (Signed)
  D:  Patient presents mildly anxious, but appropriate.  Patient is looking forward to  discharge scheduled for tomorrow. Patient observed attending groups and interacting with peers.  Patient reports having a good day.  Patient denies SI/HI and AVH.  A:  Labs/Vitals are being monitored; Medication education provided; Patient supported emotionally; Patient asked to communicate his needs, concerns, and questions.  R:  Patient remains safe on the unit with 15 minute checks.    07/20/20 2200  Psych Admission Type (Psych Patients Only)  Admission Status Voluntary  Psychosocial Assessment  Patient Complaints None  Eye Contact Fair  Facial Expression Anxious;Worried  Affect Appropriate to circumstance  Surveyor, minerals Activity Slow  Appearance/Hygiene Disheveled  Behavior Characteristics Cooperative;Appropriate to situation  Thought Process  Coherency WDL  Content Paranoia  Delusions Paranoid  Perception Hallucinations  Hallucination Auditory  Judgment Impaired  Confusion None  Danger to Self  Current suicidal ideation? Denies  Danger to Others  Danger to Others None reported or observed

## 2020-07-20 NOTE — Plan of Care (Signed)
  Problem: Education: Goal: Emotional status will improve Outcome: Progressing Goal: Mental status will improve Outcome: Progressing Goal: Verbalization of understanding the information provided will improve Outcome: Progressing   Problem: Health Behavior/Discharge Planning: Goal: Compliance with prescribed medication regimen will improve Outcome: Completed/Met

## 2020-07-20 NOTE — Progress Notes (Signed)
   07/20/20 1100  Psych Admission Type (Psych Patients Only)  Admission Status Voluntary  Psychosocial Assessment  Patient Complaints None  Eye Contact Fair  Facial Expression Worried  Affect Appropriate to circumstance  Speech Logical/coherent  Interaction Assertive  Motor Activity Slow  Appearance/Hygiene Disheveled  Behavior Characteristics Cooperative  Mood Anxious  Thought Process  Coherency WDL  Content Paranoia  Delusions Paranoid  Perception Hallucinations  Hallucination Auditory  Judgment Impaired  Confusion None  Danger to Self  Current suicidal ideation? Denies  Danger to Others  Danger to Others None reported or observed

## 2020-07-20 NOTE — Progress Notes (Signed)
Recreation Therapy Notes  Date: 5.3.22 Time: 0945 Location: [address] Dayroom       Group Topic/Focus: Music Therapy  Goal Area(s) Addresses:  Patient will engage in pro-social way in music group.  Patient will demonstrate no behavioral issues during group.   Behavioral Response:  Engaged, Appropriate   Intervention: Music   Education: Leisure exposure, Coping skills, Musical expression, Discharge Planning  Education Outcome: Acknowledges Education/In group clarification  Clinical Observations/Feedback: Patient sat a listened to peers engaged with each other and peer play the guitar.  Patient would rock along to rhythm of the guitar and was smiling during group session.  Patient was appropriate throughout group.    Caroll Rancher, LRT/CTRS    Lillia Abed, Deeann Servidio A 07/20/2020 11:09 AM
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
## 2020-07-20 NOTE — [facility] Group Notes (Signed)
[facility] Group Notes:  (Nursing/MHT/Case Management/Adjunct)  Date:  07/20/2020  Time:  11:25 AM  Type of Therapy:  Group Therapy  Participation Level:  Active  Participation Quality:  Appropriate  Affect:  Appropriate  Cognitive:  Appropriate  Insight:  Appropriate  Engagement in Group:  Engaged  Modes of Intervention:  Orientation  Summary of Progress/Problems: His goal is to be able to go home so he can get back to his responsibilities.   Jerry [name] J Warwick Nick 07/20/2020, 11:25 AM

## 2020-07-20 NOTE — Progress Notes (Signed)
Adult Psychoeducational Group Note  Date:  07/20/2020 Time:  9:09 PM  Group Topic/Focus:  Wrap-Up Group:   The focus of this group is to help patients review their daily goal of treatment and discuss progress on daily workbooks.  Participation Level:  Active  Participation Quality:  Appropriate  Affect:  Appropriate  Cognitive:  Appropriate  Insight: Appropriate  Engagement in Group:  Developing/Improving  Modes of Intervention:  Discussion  Additional Comments:  Pt stated his goal for today was to focus on his treatment plan. Pt stated he accomplished his goal today. Pt stated hetalked with his doctorandhis social worker about his care today.Pt stated he was able to talk with his doctor about discharge. PT stated discharge is set up for 07/21/2020.Pt rated his overall day a 10. Pt stated he made no calls today. Pt stated he felt better about himself today. Pt stated he attend all groups held today.Pt stated he was able to attend all meals. Pt stated he took all medications provided today. Pt stated his appetite wasgoodtoday. Pt rated sleep last night was pretty good. Pt stated the goal tonight was to get some rest. Pt stated he was in nophysical pain tonight. Pt deny visual hallucinations and auditory issues tonight.Pt denies thoughts of harming himself or others. Pt stated he would alert staff if anything changed.  [name] 07/20/2020, 9:09 PM

## 2020-07-20 NOTE — Progress Notes (Signed)
Jerry [name] was up and in the day room much of the night watching TV with his peers.  He reported that he was feeling much better and hoping to leave soon.  He denied SI/HI or visual hallucinations.  He continues to report some auditory hallucinations but "I always have them."  They are not command in nature.  Trazodone given at HS with good relief.  He is currently resting quietly in bed and appears to be asleep.  Q 15 minute checks maintained for safety.  We will continue to monitor the progress towards his goals.   07/19/20 2140  Psych Admission Type (Psych Patients Only)  Admission Status Voluntary  Psychosocial Assessment  Patient Complaints None  Eye Contact Fair  Facial Expression Worried  Affect Appropriate to circumstance  Speech Logical/coherent  Interaction Assertive  Motor Activity Slow  Appearance/Hygiene Disheveled  Behavior Characteristics Cooperative;Appropriate to situation  Mood Anxious  Thought Process  Coherency WDL  Content Paranoia  Delusions Paranoid  Perception Hallucinations  Hallucination Auditory  Judgment Impaired  Confusion None  Danger to Self  Current suicidal ideation? Denies  Danger to Others  Danger to Others None reported or observed

## 2020-07-21 MED ORDER — RISPERIDONE 1 MG PO TABS: 1.0000 mg | ORAL_TABLET | Freq: Every day | ORAL | 0 refills | Status: DC

## 2020-07-21 MED ORDER — NICOTINE 21 MG/24HR TD PT24: 21.0000 mg | MEDICATED_PATCH | Freq: Every day | TRANSDERMAL | 0 refills | Status: DC

## 2020-07-21 MED ORDER — TRAZODONE HCL 50 MG PO TABS: 50.0000 mg | ORAL_TABLET | Freq: Every evening | ORAL | 0 refills | Status: DC | PRN

## 2020-07-21 MED ORDER — GABAPENTIN 100 MG PO CAPS: 100.0000 mg | ORAL_CAPSULE | Freq: Three times a day (TID) | ORAL | 0 refills | Status: DC

## 2020-07-21 NOTE — Plan of Care (Signed)
  Problem: Physical Regulation: Goal: Complications related to the disease process, condition or treatment will be avoided or minimized Outcome: Progressing   Problem: Safety: Goal: Ability to remain free from injury will improve Outcome: Progressing   Problem: Coping: Goal: Coping ability will improve Outcome: Progressing

## 2020-07-21 NOTE — [facility] Group Notes (Signed)
[facility] Group Notes:  (Nursing/MHT/Case Management/Adjunct)  Date:  07/21/2020  Time:  10:42 AM  Type of Therapy:  Group Therapy  Participation Level:  Active  Participation Quality:  Appropriate  Affect:  Appropriate  Cognitive:  Appropriate  Insight:  Appropriate  Engagement in Group:  Engaged  Modes of Intervention:  Orientation  Summary of Progress/Problems: His goal is to get discharged today.   Ivannah Zody J Chloris Marcoux 07/21/2020, 10:42 AM
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
## 2020-07-21 NOTE — Plan of Care (Signed)
Discharge note  Patient verbalizes readiness for discharge. Follow up plan explained, AVS, Transition record and SRA given. Prescriptions and teaching provided. Belongings returned and signed for. Suicide safety plan completed and signed. Patient verbalizes understanding. Patient denies SI/HI and assures this Clinical research associate they will seek assistance should that change. Patient discharged to lobby to wait for safe transport.  Problem: Education: Goal: Knowledge of [facility] Education information/materials will improve Outcome: Adequate for Discharge Goal: Emotional status will improve Outcome: Adequate for Discharge Goal: Mental status will improve Outcome: Adequate for Discharge Goal: Verbalization of understanding the information provided will improve Outcome: Adequate for Discharge   Problem: Coping: Goal: Will verbalize feelings Outcome: Adequate for Discharge   Problem: Nutritional: Goal: Ability to achieve adequate nutritional intake will improve Outcome: Adequate for Discharge   Problem: Role Relationship: Goal: Ability to communicate needs accurately will improve Outcome: Adequate for Discharge Goal: Ability to interact with others will improve Outcome: Adequate for Discharge   Problem: Safety: Goal: Ability to redirect hostility and anger into socially appropriate behaviors will improve Outcome: Adequate for Discharge Goal: Ability to remain free from injury will improve Outcome: Adequate for Discharge   Problem: Self-Care: Goal: Ability to participate in self-care as condition permits will improve Outcome: Adequate for Discharge   Problem: Self-Concept: Goal: Will verbalize positive feelings about self Outcome: Adequate for Discharge   Problem: Health Behavior/Discharge Planning: Goal: Ability to identify changes in lifestyle to reduce recurrence of condition will improve Outcome: Adequate for Discharge Goal: Identification of resources available to assist in  meeting health care needs will improve Outcome: Adequate for Discharge   Problem: Physical Regulation: Goal: Complications related to the disease process, condition or treatment will be avoided or minimized 07/21/2020 1119 by Raylene Miyamoto, RN Outcome: Adequate for Discharge 07/21/2020 [id number] by Raylene Miyamoto, RN Outcome: Progressing   Problem: Safety: Goal: Ability to remain free from injury will improve 07/21/2020 1119 by Raylene Miyamoto, RN Outcome: Adequate for Discharge 07/21/2020 2595 by Raylene Miyamoto, RN Outcome: Progressing   Problem: Health Behavior/Discharge Planning: Goal: Identification of resources available to assist in meeting health care needs will improve Outcome: Adequate for Discharge   Problem: Medication: Goal: Compliance with prescribed medication regimen will improve Outcome: Adequate for Discharge   Problem: Self-Concept: Goal: Ability to disclose and discuss suicidal ideas will improve Outcome: Adequate for Discharge Goal: Will verbalize positive feelings about self Outcome: Adequate for Discharge   Problem: Activity: Goal: Will verbalize the importance of balancing activity with adequate rest periods Outcome: Adequate for Discharge   Problem: Education: Goal: Will be free of psychotic symptoms Outcome: Adequate for Discharge Goal: Knowledge of the prescribed therapeutic regimen will improve Outcome: Adequate for Discharge   Problem: Coping: Goal: Coping ability will improve 07/21/2020 1119 by Raylene Miyamoto, RN Outcome: Adequate for Discharge 07/21/2020 [phone] by Raylene Miyamoto, RN Outcome: Progressing   Problem: Education: Goal: Ability to make informed decisions regarding treatment will improve Outcome: Adequate for Discharge   Problem: Coping: Goal: Coping ability will improve Outcome: Adequate for Discharge

## 2020-07-21 NOTE — Plan of Care (Signed)
Patient participated in participated in recreation therapy group sessions without encouragement or prompting from staff.    Caroll Rancher, LRT/CTRS

## 2020-07-21 NOTE — Discharge Summary (Signed)
Physician Discharge Summary Note  Patient:  Jerry [name] is an 45 y.o., male MRN:  [id number] DOB:  1975-08-07 Patient phone:  [phone] (home)  Patient address:   [address] [place] [id number],  Total Time spent with patient: Greater than 30 minutes  Date of Admission:  07/16/2020 Date of Discharge: 07-21-20  Reason for Admission: Worsening psychosis (AVH & paranoia).  Principal Problem: Schizophrenia spectrum disorder with psychotic disorder type not yet determined [facility](HCC)  Discharge Diagnoses: Principal Problem:   Schizophrenia spectrum disorder with psychotic disorder type not yet determined (HCC) Active Problems:   Depression, unspecified   Methamphetamine abuse (HCC)   Marijuana abuse   Cocaine abuse (HCC)  Past Psychiatric History: Schizophrenia  Past Medical History:  Past Medical History:  Diagnosis Date  . Anxiety   . Bipolar 1 disorder (HCC)   . Depression   . Hallucination   . PTSD (post-traumatic stress disorder)     Past Surgical History:  Procedure Laterality Date  . KIDNEY DONATION Right 2003   Family History:  Family History  Problem Relation Age of Onset  . Heart disease Mother   . Cancer Mother   . Heart disease Father   . Diabetes Father   . Diabetes Maternal Grandmother   . Stroke Paternal Grandfather    Family Psychiatric  History: See H&P  Social History:  Social History   Substance and Sexual Activity  Alcohol Use No  . Alcohol/week: 32.0 standard drinks  . Types: 12 Cans of beer, 20 Shots of liquor per week   Comment:  last use was 1 month ago      Social History   Substance and Sexual Activity  Drug Use Yes  . Types: Marijuana, Cocaine, Heroin, Amphetamines   Comment: history of IVDA    Social History   Socioeconomic History  . Marital status: Married    Spouse name: Not on file  . Number of children: Not on file  . Years of education: Not on file  . Highest education level: Not on file   Occupational History  . Not on file  Tobacco Use  . Smoking status: Current Every Day Smoker    Packs/day: 2.00    Types: Cigarettes  . Smokeless tobacco: Never Used  Substance and Sexual Activity  . Alcohol use: No    Alcohol/week: 32.0 standard drinks    Types: 12 Cans of beer, 20 Shots of liquor per week    Comment:  last use was 1 month ago   . Drug use: Yes    Types: Marijuana, Cocaine, Heroin, Amphetamines    Comment: history of IVDA  . Sexual activity: Yes  Other Topics Concern  . Not on file  Social History Narrative  . Not on file   Social Determinants of Health   Financial Resource Strain: Not on file  Food Insecurity: Not on file  Transportation Needs: Not on file  Physical Activity: Not on file  Stress: Not on file  Social Connections: Not on file   Hospital Course: (Per Md's admission evaluation notes): Patient is a 44y/o male with h/o polysubstance abuse and alcohol abuse, who [facility] on 07/15/20 for management of paranoia and AVH. The patient is unsure of his past psychiatric diagnoses but states that a year ago he was on Risperdal (unknown dose) and Trazodone. Per his ED notes, he previously reported having up to 7 past psychiatric admissions with [place] 1 [place] in [place]. He states  that he has recently been paranoid and believing that police and other people are after him. He admits to internal AH telling him to kill himself and making derogatory statements about him. He reports VH of "seeing people." He denies first rank symptoms, ideas of reference, or magical thinking. He states that he previously attempted suicide once in the past by jumping in front of car. He endorses recent depressed mood with associated poor sleep, poor appetite, poor focus, and low concentration. He states he is unemployed and has psychosocial stress related to housing issues/homelessness. He admits to intermittent THC use but is vague as to the pattern or amount  of recent use. He states he "got with some people" and snorted methamphetamines once prior to admission which has made his underlying paranoia worse. Per his ED notes he has a h/o past cocaine use as well as past abuse of opiates. He admits to drinking 4-6 40oz beers per day with an alcohol abuse history over the last 10 years. He denies current SI and denies HI. He has difficulty answering questions related to potential past manic/hypomanic episodes and provides vague history. See H&P for additional details.   Prior to this discharge, Louden was seen & evaluated for mental health stability. The current laboratory findings were reviewed (stable), nurses notes & vital signs were reviewed as well. There are no current mental health or medical issues that should prevent this discharge at this time. Patient is being discharged to continue mental health care as noted below.  Although with an extensive hx of mental health issues, possible polysubstance use disorders & multiple psychiatric hospitalizations per chart review, this is Draven's first psychiatric admission/discharge summary from this [facility]. He was admitted with complaint of worsening psychosis & was recommended for mood stabilization treatments by her treatment team after her admission evaluation. And with his consent, he received, stabilized & was discharged on the medications as listed below on his discharge medication lists. He was also enrolled & participated in the group counseling sessions being offered & held on this unit. He learned coping skills. He presented no other significant pre-existing medical conditions that required treatment or monitoring. He tolerated her treatment regimen without any adverse effects or reactions reported. Bhavya's symptoms responded well to his treatment regimen warranting this discharge.   During the course of this hospitalization, the 15-minute checks were adequate to ensure Cardin's safety. Patient did not display any  dangerous, violent or suicidal behavior on the unit. He interacted with patients & staff appropriately. He participated appropriately in the group sessions/therapies. His medications were addressed & adjusted to meet her needs. He was recommended for outpatient follow-up care & medication management upon discharge to assure his continuity of care.  At the time of discharge, patient is not reporting any acute suicidal/homicidal ideations, no psychosis. He currently denies any new issues or concerns. Education and supportive counseling provided throughout his hospital stay & upon discharge.   Today upon his discharge evaluation with the attending psychiatrist, Kwamane shares he is doing well. He denies any other specific concerns. He is sleeping well. His appetite is good. He denies other physical complaints. He denies AH/VH, delusional thoughts or paranoia.  He was able to engage in safety planning including plan to return to [facility] or contact emergency services if he feels unable to maintain his own safety or the safety of others. Pt had no further questions, comments, or concerns. He left [facility] with all personal belongings in no apparent distress. Transportation per the safe  transport services..  Physical Findings: AIMS: Facial and Oral Movements Muscles of Facial Expression: None, normal Lips and Perioral Area: None, normal Jaw: None, normal Tongue: None, normal,Extremity Movements Upper (arms, wrists, hands, fingers): None, normal Lower (legs, knees, ankles, toes): None, normal, Trunk Movements Neck, shoulders, hips: None, normal, Overall Severity Severity of abnormal movements (highest score from questions above): None, normal Incapacitation due to abnormal movements: None, normal Patient's awareness of abnormal movements (rate only patient's report): No Awareness, Dental Status Current problems with teeth and/or dentures?: No Does patient usually wear dentures?: No  CIWA:  CIWA-Ar Total: 0 COWS:   COWS Total Score: 2  Musculoskeletal: Strength & Muscle Tone: within normal limits Gait & Station: normal Patient leans: Right  Psychiatric Specialty Exam:  Presentation  General Appearance: Casual  Eye Contact:Good  Speech:Clear and Coherent  Speech Volume:Normal  Handedness:No data recorded  Mood and Affect  Mood:Euthymic (Anxious about getting discharged.)  Affect:Congruent   Thought Process  Thought Processes:Coherent; Goal Directed  Descriptions of Associations:Intact  Orientation:Full (Time, Place and Person)  Thought Content:Logical  History of Schizophrenia/Schizoaffective disorder:No data recorded Duration of Psychotic Symptoms:No data recorded Hallucinations:Hallucinations: None  Ideas of Reference:None  Suicidal Thoughts:Suicidal Thoughts: No  Homicidal Thoughts:Homicidal Thoughts: No  Sensorium  Memory:Immediate Good; Recent Fair; Remote Fair  Judgment:Fair  Insight:Fair  Executive Functions  Concentration:Fair  Attention Span:Fair  Recall:Fair  Fund of Knowledge:Fair  Language:Good  Psychomotor Activity  Psychomotor Activity:Psychomotor Activity: Normal  Assets  Assets:Communication Skills; Desire for Improvement; Physical Health; Resilience; Social Support  Sleep  Sleep:Sleep: Fair Number of Hours of Sleep: 5.25  Physical Exam: Physical Exam Vitals and nursing note reviewed.  HENT:     Head: Normocephalic.     Nose: Nose normal.     Mouth/Throat:     Pharynx: Oropharynx is clear.  Eyes:     Pupils: Pupils are equal, round, and reactive to light.  Cardiovascular:     Rate and Rhythm: Normal rate.     Pulses: Normal pulses.  Pulmonary:     Effort: Pulmonary effort is normal.  Genitourinary:    Comments: Deferred Musculoskeletal:        General: Normal range of motion.     Cervical back: Normal range of motion.  Skin:    General: Skin is warm and dry.  Neurological:     General: No focal deficit present.      Mental Status: He is alert and oriented to person, place, and time. Mental status is at baseline.    Review of Systems  Constitutional: Negative for chills and fever.  HENT: Negative for congestion and sore throat.   Eyes: Negative for blurred vision.  Respiratory: Negative for cough, shortness of breath and wheezing.   Cardiovascular: Negative for chest pain and palpitations.  Gastrointestinal: Negative.   Genitourinary: Negative.   Musculoskeletal: Negative.   Skin: Negative.   Neurological: Negative.   Endo/Heme/Allergies:       Allergies: PCN  Psychiatric/Behavioral: Positive for depression (Hx. of (Stable on medication)), hallucinations (Hx. psychosis (stable on medication)) and substance abuse (Hx. Amphetamine, cocaine & THC use disorders.). Negative for memory loss and suicidal ideas. The patient has insomnia (Hx. of (Stable on medication)). The patient is not nervous/anxious (Stable upon discharge).    Blood pressure 108/75, pulse (!) 108, temperature 98 F (36.7 C), temperature source Oral, resp. rate 18, height 5\' 9"  (1.753 m), weight 73.5 kg, SpO2 100 %. Body mass index is 23.92 kg/m.  Has this patient used any form  of tobacco in the last 30 days? (Cigarettes, Smokeless Tobacco, Cigars, and/or Pipes): N/A  Blood Alcohol level:  Lab Results  Component Value Date   ETH <10 07/15/2020   ETH <11 05/18/2013   Metabolic Disorder Labs:  Lab Results  Component Value Date   HGBA1C 5.3 07/17/2020   MPG 105.41 07/17/2020   MPG 108 06/25/2014   No results found for: PROLACTIN Lab Results  Component Value Date   CHOL 173 07/17/2020   TRIG 106 07/17/2020   HDL 52 07/17/2020   CHOLHDL 3.3 07/17/2020   VLDL 21 07/17/2020   LDLCALC 100 (H) 07/17/2020   See Psychiatric Specialty Exam and Suicide Risk Assessment completed by Attending Physician prior to discharge.  Discharge destination:  Home  Is patient on multiple antipsychotic therapies at discharge:  No   Has Patient  had three or more failed trials of antipsychotic monotherapy by history:  No  Recommended Plan for Multiple Antipsychotic Therapies: NA Discharge Instructions    Diet - low sodium heart healthy   Complete by: As directed      Allergies as of 07/21/2020      Reactions   Penicillins Other (See Comments)   Unknown reaction as a child      Medication List    TAKE these medications     Indication  gabapentin 100 MG capsule Commonly known as: NEURONTIN Take 1 capsule (100 mg total) by mouth 3 (three) times daily. For agitation  Indication: Agitation   nicotine 21 mg/24hr patch Commonly known as: NICODERM CQ - dosed in mg/24 hours Place 1 patch (21 mg total) onto the skin daily. For smoking cessation Start taking on: Jul 22, 2020  Indication: Nicotine Addiction   risperiDONE 1 MG tablet Commonly known as: RISPERDAL Take 1 tablet (1 mg total) by mouth at bedtime. For mood control  Indication: Mood control   traZODone 50 MG tablet Commonly known as: DESYREL Take 1 tablet (50 mg total) by mouth at bedtime as needed for sleep.  Indication: Trouble Sleeping       Follow-up Information    [facility]. Go on 07/23/2020.   Specialty: Behavioral Health Why: You have an appointment for therapy services on 07/23/20 at 12:00 pm and on 08/16/20 at 7:45 am for medication management services.  These appointments will be held in person and are first come, first served with this provider.  Contact information: [address] [place] [address] [phone]             Follow-up recommendations: Activity:  As tolerated Diet: As recommended by your primary care doctor. Keep all scheduled follow-up appointments as recommended.  Comments: Prescriptions given at discharge.  Patient agreeable to plan.  Given opportunity to ask questions.  Appears to feel comfortable with discharge denies any current suicidal or homicidal thought. Patient is also  instructed prior to discharge to: Take all medications as prescribed by his/her mental healthcare provider. Report any adverse effects and or reactions from the medicines to his/her outpatient provider promptly. Patient has been instructed & cautioned: To not engage in alcohol and or illegal drug use while on prescription medicines. In the event of worsening symptoms, patient is instructed to call the crisis hotline, 911 and or go to the nearest ED for appropriate evaluation and treatment of symptoms. To follow-up with his/her primary care provider for your other medical issues, concerns and or health care needs.  Signed: Armandina Stammer, NP, PMHNP, FNP-BC 07/21/2020, 11:02 AM

## 2020-07-21 NOTE — Progress Notes (Signed)
  [facility] Adult Case Management Discharge Plan :  Will you be returning to the same living situation after discharge:  No. Will be returning to stay in shelter At discharge, do you have transportation home?: No. Safe Transport to be arranged.  Do you have the ability to pay for your medications: Yes,  has insurance  Release of information consent forms completed and in the chart;  Patient's signature needed at discharge.  Patient to Follow up at:  Follow-up Information    [facility]. Go on 07/23/2020.   Specialty: Behavioral Health Why: You have an appointment for therapy services on 07/23/20 at 12:00 pm and on 08/16/20 at 7:45 am for medication management services.  These appointments will be held in person and are first come, first served with this provider.  Contact information: [address] [place] [address] [phone]              Next level of care provider has access to [facility] Link:yes  Safety Planning and Suicide Prevention discussed: Yes,  with patient     Has patient been referred to the Quitline?: Patient refused referral  Patient has been referred for addiction treatment: Pt. refused referral  Otelia Santee, LCSW 07/21/2020, 9:08 AM

## 2020-07-21 NOTE — Progress Notes (Signed)
Recreation Therapy Notes  Date: 5.4.22 Time: 1000 Location: 500 Hall Dayroom   Group Topic: Communication, Team Building, Problem Solving  Goal Area(s) Addresses:  Patient will effectively work with peer towards shared goal.  Patient will identify skills used to make activity successful.  Patient will share challenges and verbalize solution-driven approaches used. Patient will identify how skills used during activity can be used to reach post d/c goals.   Behavioral Response: Engaged  Intervention: STEM Activity   Activity: [employer]. Patients were provided the following materials: 2 drinking straws, 5 rubber bands, 5 paper clips, 2 index cards and 2 drinking cups.  Using the provided materials patients were asked to build a launching mechanism to launch a ping pong ball across the room, approximately 10 feet. Patients were divided into teams of 3-5. Instructions required all materials be incorporated into the device, functionality of items left to the peer group's discretion.  Education:Social Skills, Scientist, physiological, Creative Process, Discharge Planning.   Education Outcome: Acknowledges education/In group clarification offered/Needs additional education.   Clinical Observations/Feedback: Pt was active and engaged.  Pt worked well with his peer in attempting to complete the activity.  Pt was bright and listened when peer had ideas to share and also presented his own ideas to his peer.  Pt was pleasant and attentive during group.    Caroll Rancher, LRT/CTRS        Caroll Rancher A 07/21/2020 12:08 PM

## 2020-07-21 NOTE — Progress Notes (Signed)
Recreation Therapy Notes  INPATIENT RECREATION TR PLAN  Patient Details Name: Jerry [name] MRN: [id number] DOB: November 20, 1975 Today's Date: 07/21/2020  Rec Therapy Plan Is patient appropriate for Therapeutic Recreation?: Yes Treatment times per week: about 3 days Estimated Length of Stay: 5-7 days TR Treatment/Interventions: Group participation (Comment)  Discharge Criteria Pt will be discharged from therapy if:: Discharged Treatment plan/goals/alternatives discussed and agreed upon by:: Patient/family  Discharge Summary Short term goals set: See patient care plan Short term goals met: Complete Progress toward goals comments: Groups attended Which groups?: Coping skills,Other (Comment) (Team Building; Music Therapy) Reason goals not met: None Therapeutic equipment acquired: N/A Reason patient discharged from therapy: Discharge from hospital Pt/family agrees with progress & goals achieved: Yes Date patient discharged from therapy: 07/21/20    Victorino Sparrow, LRT/CTRS  Ria Comment, [place] 07/21/2020, 12:16 PM

## 2020-07-23 ENCOUNTER — Other Ambulatory Visit: Payer: Self-pay

## 2020-07-23 ENCOUNTER — Ambulatory Visit (HOSPITAL_COMMUNITY)
Admission: EM | Admit: 2020-07-23 | Discharge: 2020-07-23 | Disposition: A | Discharge status: Home / Self Care | Payer: 59

## 2020-07-23 DIAGNOSIS — F1510 Other stimulant abuse, uncomplicated: Secondary | ICD-10-CM

## 2020-07-23 DIAGNOSIS — F1994 Other psychoactive substance use, unspecified with psychoactive substance-induced mood disorder: Secondary | ICD-10-CM | POA: Diagnosis not present

## 2020-07-23 NOTE — Discharge Instructions (Addendum)
Patient is instructed prior to discharge to:  Take all medications as prescribed by his/her mental healthcare provider. Report any adverse effects and or reactions from the medicines to his/her outpatient provider promptly. Keep all scheduled appointments, to ensure that you are getting refills on time and to avoid any interruption in your medication.  If you are unable to keep an appointment call to reschedule.  Be sure to follow-up with resources and follow-up appointments provided.  Patient has been instructed & cautioned: To not engage in alcohol and or illegal drug use while on prescription medicines. In the event of worsening symptoms, patient is instructed to call the crisis hotline, 911 and or go to the nearest ED for appropriate evaluation and treatment of symptoms. To follow-up with his/her primary care provider for your other medical issues, concerns and or health care needs.    Substance Abuse Treatment Resources listed Below:  [facility] - Admissions are currently completed Monday through Friday at 8am; both appointments and walk-ins are accepted.  Any individual that is a [place] resident may present for a substance abuse screening and assessment for admission.  A person may be referred by numerous sources or self-refer.   Potential clients will be screened for medical necessity and appropriateness for the program.  Clients must meet criteria for high-intensity residential treatment services.  If clinically appropriate, a client will continue with the comprehensive clinical assessment and intake process, as well as enrollment in the [facility].  Address: [address] [place], [place] [address] Admin Hours: Mon-Fri 8AM to 5PM Center Hours: 24/7 Phone: [phone] Fax: [phone]  [facility] Address: [address], [place], [place] [address] Behavioral [facility] Urgent Care ([facility]) Hours: 24/7 Phone:  [phone] Fax: [phone]  [facility]): (offers outpatient therapy and intensive outpatient substance abuse therapy).  [address], [place], [place] [address] Phone: [phone]  [facility]: Offers FREE recovery skills classes, support groups, 1:1 Peer Support, and Compeer Classes. [address], , [place] [address] Phone: [phone] (Call to complete intake).   [facility] [address] [place], [place] [address] Phone: [phone] ext [phone] [facility] provides food, shelter and other programs and services to the homeless men of [place] through our men's program.  By offering safe shelter, three meals a day, clean clothing, Biblical counseling, financial planning, vocational training, GED/education and employment assistance, we've helped mend the shattered lives of many homeless men since opening in 1974.  We have approximately 267 beds available, with a max of 312 beds including mats for emergency situations and currently house an average of 270 men a night.  Prospective Client Check-In Information Photo ID Required (State/ Out of State/ DOC) - if photo ID is not available, clients are required to have a printout of a police/sheriff's criminal history report. Help out with chores around the Mission. No sex offender of any type (pending, charged, registered and/or any other sex related offenses) will be permitted to check in. Must be willing to abide by all rules, regulations, and policies established by the [facility]. The following will be provided - shelter, food, clothing, and biblical counseling. If you or someone you know is in need of assistance at our men's shelter in [place], [place], please call [phone] ext. [phone].  [facility]-will provide timely access to mental health services for children and adolescents (4-17) and adults    presenting in a mental health crisis. The program is designed for those who need urgent Behavioral Health or Substance Use treatment and are not experiencing a medical crisis that would typically require an emergency room visit.    [address] [place],  [address] Phone: [phone] [email or web address]  [facility]: Phone#: [phone]  The [facility] SA Intensive Outpatient Program (SAIOP) means structured individual and group addiction activities and services that are provided at an outpatient program designed to assist adult and adolescent consumers to begin recovery and learn skills for recovery maintenance. The [facility] SAIOP program is offered at least 3 hours a day, 3 days a week.SAIOP services shall include a structured program consisting of, but not limited to, the following services: Individual counseling and support; Group counseling and support; Family counseling, training or support; Biochemical assays to identify recent drug use (e.g., urine drug screens); Strategies for relapse prevention to include community and social support systems in treatment; Life skills; Crisis contingency planning; Disease Management; and Treatment support activities that have been adapted or specifically designed for persons with physical disabilities, or persons with co-occurring disorders of mental illness and substance abuse/dependence or mental retardation/developmental disability and substance abuse/dependence. Phone: [phone]  Address:   [facility] [place] [facility] will also offer the following outpatient services: (Monday through Friday 8am-5pm)   Partial Hospitalization Program (PHP) Substance Abuse Intensive Outpatient Program (SA-IOP) Group Therapy Medication Management Peer Living Room We also provide (24/7):  Assessments: Our mental health clinician and providers will conduct a focused mental health evaluation, assessing for immediate  safety concerns and further mental health needs. Referral: Our team will provide resources and help connect to community based mental health treatment, when indicated, including psychotherapy, psychiatry, and other specialized behavioral health or substance use disorder services (for those not already in treatment). Transitional Care: Our team providers in person bridging and/or telephonic follow-up during the patient's transition to outpatient services.  [facility] 24-Hour Call Center: [phone] Behavioral Health Crisis Line: [phone]  

## 2020-07-23 NOTE — ED Provider Notes (Signed)
Behavioral Health Urgent Care Medical Screening Exam  Patient Name: Jerry [name] MRN: [id number] Date of Evaluation: 07/23/20 Chief Complaint:   Diagnosis:  Final diagnoses:  Methamphetamine abuse (HCC)  Substance induced mood disorder (HCC)    History of Present illness: Jerry [name] is a 45 y.o. male.  Patient presents voluntarily to [facility] behavioral health for walk-in assessment.  Patient was transported by EMS after he called to report paranoia.  Jerry [name] assessed by nurse practitioner.  He is alert and oriented, answers appropriately.  He appears anxious upon my assessment.  He reports prior to arrival he felt that people were chasing him from parking lot to parking lot. He endorses relapse on methamphetamine yesterday.   Jerry [name] reports "if I can get into a program then I can keep myself and others around me safe."  He contracts verbally for safety with this Clinical research associate.  He endorses 1 prior suicide attempt at age 80 when he attempted to jump in front of a car intentionally.  He reports he left [facility] 2 days ago, he has not taken medication since leaving inpatient hospital stay.  Jerry [name] reports upon leaving hospital he has slept in a tent with friends.  He used meth on yesterday but felt the meth was "no good."  On yesterday, he reports hallucinations.  Today he reports paranoia feeling that others are out to get him.  Jerry [name] was scheduled for outpatient psychiatry follow-up today at [facility] to initiate counseling, he was unable to attend this appointment.  He also has an upcoming appointment on 08/16/2020 for medication management.  Current medications include gabapentin 100 mg 3 times daily, risperidone 1 mg nightly and trazodone 50 mg nightly.  Jerry [name] currently resides in a hotel with his ex-wife and adult son.  He did not return to his hotel after discharge from behavioral health.  He denies access to weapons.  He is currently not  employed.  He reports he did not sleep well last night.  He endorses average appetite.  He denies substance use aside from meth for the past 2 days.  Patient offered support and encouragement.  He denies any person to contact for collateral information.  Discussed at length patient's ability to keep himself and others around him safe so that he could be considered for substance use treatment, patient reassures this writer that he can keep himself and those around him safe.  [facility]iscussed [facility] patient reports concern that he has no picture ID but otherwise on board with this plan.  Contacted [employer] who report no availability at this time however patient will be given contact information to follow-up with [employer] as well as other substance use treatment resources.  Psychiatric Specialty Exam  Presentation  General Appearance:Casual  Eye Contact:Good  Speech:Clear and Coherent; Normal Rate  Speech Volume:Normal  Handedness:Right   Mood and Affect  Mood:Anxious  Affect:Congruent   Thought Process  Thought Processes:Coherent; Goal Directed  Descriptions of Associations:Intact  Orientation:Full (Time, Place and Person)  Thought Content:Logical    Hallucinations:None "playbook in my mind"  Ideas of Reference:Paranoia  Suicidal Thoughts:No  Homicidal Thoughts:No   Sensorium  Memory:Immediate Good; Remote Good  Judgment:Fair  Insight:Fair   Executive Functions  Concentration:Good  Attention Span:Good  Recall:Good  Fund of Knowledge:Good  Language:Good   Psychomotor Activity  Psychomotor Activity:Normal   Assets  Assets:Communication Skills; Desire for Improvement; Financial Resources/Insurance; Housing; Intimacy; Leisure Time; Physical Health; Resilience; Social Support; Talents/Skills  Sleep  Sleep:Poor  Number of hours: 5.25   No data recorded  Physical Exam: Physical Exam Vitals and nursing note  reviewed.  Constitutional:      Appearance: He is well-developed.  HENT:     Head: Normocephalic.  Cardiovascular:     Rate and Rhythm: Normal rate.  Pulmonary:     Effort: Pulmonary effort is normal.  Neurological:     Mental Status: He is alert and oriented to person, place, and time.  Psychiatric:        Attention and Perception: Attention and perception normal.        Mood and Affect: Affect normal. Mood is anxious.        Speech: Speech normal.        Behavior: Behavior normal. Behavior is cooperative.        Thought Content: Thought content is paranoid.        Cognition and Memory: Cognition and memory normal.    Review of Systems  Constitutional: Negative.   HENT: Negative.   Eyes: Negative.   Respiratory: Negative.   Cardiovascular: Negative.   Gastrointestinal: Negative.   Genitourinary: Negative.   Musculoskeletal: Negative.   Skin: Negative.   Neurological: Negative.   Endo/Heme/Allergies: Negative.   Psychiatric/Behavioral: Positive for substance abuse. The patient is nervous/anxious.    Blood pressure (!) 156/96, pulse (!) 116, temperature 97.8 F (36.6 C), temperature source Oral, resp. rate 20, SpO2 96 %. There is no height or weight on file to calculate BMI.  Musculoskeletal: Strength & Muscle Tone: within normal limits Gait & Station: normal Patient leans: N/A   [facility] MSE Discharge Disposition for Follow up and Recommendations: Based on my evaluation the patient does not appear to have an emergency medical condition and can be discharged with resources and follow up care in outpatient services for Medication Management, Substance Abuse Intensive Outpatient Program and Individual Therapy  Patient reviewed with Dr. Bronwen Betters. Follow-up with outpatient psychiatry. Follow-up with substance use treatment resources provided. Continue current medications including: -Gabapentin 100 mg 3 times daily -Risperidone 1 mg nightly -Trazodone 50 mg nightly   Lenard Lance, FNP 07/23/2020, 7:09 PM

## 2020-07-24 ENCOUNTER — Encounter (HOSPITAL_COMMUNITY): Payer: Self-pay | Admitting: Emergency Medicine

## 2020-07-24 ENCOUNTER — Emergency Department (HOSPITAL_COMMUNITY)
Admission: EM | Admit: 2020-07-24 | Discharge: 2020-07-24 | Disposition: A | Discharge status: Home / Self Care | Payer: 59 | Attending: Emergency Medicine | Admitting: Emergency Medicine

## 2020-07-24 ENCOUNTER — Emergency Department (HOSPITAL_COMMUNITY): Payer: 59

## 2020-07-24 ENCOUNTER — Other Ambulatory Visit: Payer: Self-pay

## 2020-07-24 DIAGNOSIS — F17210 Nicotine dependence, cigarettes, uncomplicated: Secondary | ICD-10-CM | POA: Insufficient documentation

## 2020-07-24 DIAGNOSIS — R000 Tachycardia, unspecified: Secondary | ICD-10-CM | POA: Diagnosis not present

## 2020-07-24 DIAGNOSIS — R1084 Generalized abdominal pain: Secondary | ICD-10-CM | POA: Diagnosis not present

## 2020-07-24 DIAGNOSIS — R451 Restlessness and agitation: Secondary | ICD-10-CM | POA: Insufficient documentation

## 2020-07-24 DIAGNOSIS — R112 Nausea with vomiting, unspecified: Secondary | ICD-10-CM | POA: Insufficient documentation

## 2020-07-24 DIAGNOSIS — R197 Diarrhea, unspecified: Secondary | ICD-10-CM | POA: Insufficient documentation

## 2020-07-24 DIAGNOSIS — R61 Generalized hyperhidrosis: Secondary | ICD-10-CM | POA: Insufficient documentation

## 2020-07-24 LAB — COMPREHENSIVE METABOLIC PANEL
ALT: 65 U/L — ABNORMAL HIGH (ref 0–44)
AST: 80 U/L — ABNORMAL HIGH (ref 15–41)
Albumin: 3.9 g/dL (ref 3.5–5.0)
Alkaline Phosphatase: 47 U/L (ref 38–126)
Anion gap: 8 (ref 5–15)
BUN: 17 mg/dL (ref 6–20)
CO2: 26 mmol/L (ref 22–32)
Calcium: 9.5 mg/dL (ref 8.9–10.3)
Chloride: 98 mmol/L (ref 98–111)
Creatinine, Ser: 1.02 mg/dL (ref 0.61–1.24)
GFR, Estimated: >60 mL/min (ref >60)
Glucose, Bld: 121 mg/dL — ABNORMAL HIGH (ref 70–99)
Potassium: 3.9 mmol/L (ref 3.5–5.1)
Sodium: 132 mmol/L — ABNORMAL LOW (ref 135–145)
Total Bilirubin: 1.1 mg/dL (ref 0.3–1.2)
Total Protein: 8.7 g/dL — ABNORMAL HIGH (ref 6.5–8.1)

## 2020-07-24 LAB — URINALYSIS, ROUTINE W REFLEX MICROSCOPIC
Bilirubin Urine: NEGATIVE
Glucose, UA: NEGATIVE mg/dL
Hgb urine dipstick: NEGATIVE
Ketones, ur: 5 mg/dL — AB
Leukocytes,Ua: NEGATIVE
Nitrite: NEGATIVE
Protein, ur: 100 mg/dL — AB
Specific Gravity, Urine: 1.024 (ref 1.005–1.030)
pH: 6 (ref 5.0–8.0)

## 2020-07-24 LAB — CBC
HCT: 41.4 % (ref 39.0–52.0)
Hemoglobin: 14 g/dL (ref 13.0–17.0)
MCH: 32.4 pg (ref 26.0–34.0)
MCHC: 33.8 g/dL (ref 30.0–36.0)
MCV: 95.8 fL (ref 80.0–100.0)
Platelets: 433 10*3/uL — ABNORMAL HIGH (ref 150–400)
RBC: 4.32 MIL/uL (ref 4.22–5.81)
RDW: 13.1 % (ref 11.5–15.5)
WBC: 10.2 10*3/uL (ref 4.0–10.5)
nRBC: 0 % (ref 0.0–0.2)

## 2020-07-24 LAB — ETHANOL: Alcohol, Ethyl (B): <10 mg/dL (ref <10)

## 2020-07-24 LAB — LIPASE, BLOOD: Lipase: 30 U/L (ref 11–51)

## 2020-07-24 MED ORDER — LORAZEPAM 2 MG/ML IJ SOLN
2.0000 mg | Freq: Once | INTRAMUSCULAR | Status: AC
  Administered 2020-07-24: 2 mg via INTRAVENOUS
  Filled 2020-07-24: qty 1

## 2020-07-24 NOTE — ED Triage Notes (Signed)
Patient reports right abdominal pain with emesis and diarrhea this week , denies fever or chills .

## 2020-07-24 NOTE — ED Notes (Signed)
Patient transported to CT 

## 2020-07-24 NOTE — ED Provider Notes (Addendum)
[facility] EMERGENCY DEPARTMENT Provider Note   CSN: [id number] Arrival date & time: 07/24/20  0108     History Chief Complaint  Patient presents with  . Abdominal Pain    Jerry [name] is a 45 y.o. male.  Patient presents to the emergency department with various complaints.  He reports that he has been experiencing chills, sweats, nausea, vomiting and diarrhea.  Patient reports pain on the right side of the abdomen.  Patient very agitated as well.  He states over and over again "they are going to shoot me right in the mouth"        Past Medical History:  Diagnosis Date  . Anxiety   . Bipolar 1 disorder (HCC)   . Depression   . Hallucination   . PTSD (post-traumatic stress disorder)     Patient Active Problem List   Diagnosis Date Noted  . Major depressive disorder, recurrent severe without psychotic features (HCC) 07/17/2020  . Schizophrenia spectrum disorder with psychotic disorder type not yet determined (HCC) 07/17/2020  . Methamphetamine abuse (HCC) 07/17/2020  . Marijuana abuse 07/17/2020  . Cocaine abuse (HCC) 07/17/2020  . Anxiety and depression 06/25/2014  . Family history of diabetes mellitus (DM) 06/25/2014  . Family history of thyroid disease 06/25/2014  . Tobacco abuse 06/25/2014  . Alcohol abuse 05/18/2013  . Polysubstance abuse (HCC) 05/18/2013  . Depression, unspecified 05/18/2013    Past Surgical History:  Procedure Laterality Date  . KIDNEY DONATION Right 2003       Family History  Problem Relation Age of Onset  . Heart disease Mother   . Cancer Mother   . Heart disease Father   . Diabetes Father   . Diabetes Maternal Grandmother   . Stroke Paternal Grandfather     Social History   Tobacco Use  . Smoking status: Current Every Day Smoker    Packs/day: 2.00    Types: Cigarettes  . Smokeless tobacco: Never Used  Substance Use Topics  . Alcohol use: Yes    Alcohol/week: 32.0 standard drinks    Types: 12 Cans of  beer, 20 Shots of liquor per week    Comment:  last use was 1 month ago   . Drug use: Yes    Types: Marijuana, Cocaine, Heroin, Amphetamines, Methamphetamines    Comment: history of IVDA    Home Medications Prior to Admission medications   Medication Sig Start Date End Date Taking? Authorizing Provider  gabapentin (NEURONTIN) 100 MG capsule Take 1 capsule (100 mg total) by mouth 3 (three) times daily. For agitation 07/21/20  Yes Armandina Stammer I, NP  risperiDONE (RISPERDAL) 1 MG tablet Take 1 tablet (1 mg total) by mouth at bedtime. For mood control 07/21/20  Yes Armandina Stammer I, NP  traZODone (DESYREL) 50 MG tablet Take 1 tablet (50 mg total) by mouth at bedtime as needed for sleep. 07/21/20  Yes Nwoko, Nicole Kindred I, NP  nicotine (NICODERM CQ - DOSED IN MG/24 HOURS) 21 mg/24hr patch Place 1 patch (21 mg total) onto the skin daily. For smoking cessation Patient not taking: Reported on 07/24/2020 07/22/20   Armandina Stammer I, NP    Allergies    Penicillins  Review of Systems   Review of Systems  Constitutional: Positive for diaphoresis.  Gastrointestinal: Positive for abdominal pain, nausea and vomiting.  Psychiatric/Behavioral: Positive for agitation. The patient is nervous/anxious.   All other systems reviewed and are negative.   Physical Exam Updated Vital Signs BP (!) 128/92 (BP  Location: Right Arm)   Pulse 96   Temp 98.6 F (37 C) (Oral)   Resp 19   Ht 5\' 9"  (1.753 m)   Wt 74.8 kg   SpO2 96%   BMI 24.37 kg/m   Physical Exam Vitals and nursing note reviewed.  Constitutional:      General: He is not in acute distress.    Appearance: Normal appearance. He is well-developed.  HENT:     Head: Normocephalic and atraumatic.     Right Ear: Hearing normal.     Left Ear: Hearing normal.     Nose: Nose normal.  Eyes:     Conjunctiva/sclera: Conjunctivae normal.     Pupils: Pupils are equal, round, and reactive to light.  Cardiovascular:     Rate and Rhythm: Regular rhythm. Tachycardia  present.     Heart sounds: S1 normal and S2 normal. No murmur heard. No friction rub. No gallop.   Pulmonary:     Effort: Pulmonary effort is normal. No respiratory distress.     Breath sounds: Normal breath sounds.  Chest:     Chest wall: No tenderness.  Abdominal:     General: Bowel sounds are normal.     Palpations: Abdomen is soft.     Tenderness: There is no abdominal tenderness. There is no guarding or rebound. Negative signs include Murphy's sign and McBurney's sign.     Hernia: No hernia is present.  Musculoskeletal:        General: Normal range of motion.     Cervical back: Normal range of motion and neck supple.  Skin:    General: Skin is warm and dry.     Findings: No rash.  Neurological:     Mental Status: He is alert and oriented to person, place, and time.     GCS: GCS eye subscore is 4. GCS verbal subscore is 5. GCS motor subscore is 6.     Cranial Nerves: No cranial nerve deficit.     Sensory: No sensory deficit.     Coordination: Coordination normal.  Psychiatric:        Speech: Speech normal.        Behavior: Behavior normal.        Thought Content: Thought content normal.     ED Results / Procedures / Treatments   Labs (all labs ordered are listed, but only abnormal results are displayed) Labs Reviewed  COMPREHENSIVE METABOLIC PANEL - Abnormal; Notable for the following components:      Result Value   Sodium 132 (*)    Glucose, Bld 121 (*)    Total Protein 8.7 (*)    AST 80 (*)    ALT 65 (*)    All other components within normal limits  CBC - Abnormal; Notable for the following components:   Platelets 433 (*)    All other components within normal limits  URINALYSIS, ROUTINE W REFLEX MICROSCOPIC - Abnormal; Notable for the following components:   Ketones, ur 5 (*)    Protein, ur 100 (*)    Bacteria, UA RARE (*)    All other components within normal limits  LIPASE, BLOOD  ETHANOL  RAPID URINE DRUG SCREEN, HOSP PERFORMED     EKG None  Radiology CT ABDOMEN PELVIS WO CONTRAST  Result Date: 07/24/2020 CLINICAL DATA:  Nonlocalized acute abdominal pain. Right abdominal pain with emesis and diarrhea. EXAM: CT ABDOMEN AND PELVIS WITHOUT CONTRAST TECHNIQUE: Multidetector CT imaging of the abdomen and pelvis was performed following the  standard protocol without IV contrast. COMPARISON:  None. FINDINGS: Lower chest: Limited evaluation due to motion artifact.  Line Hepatobiliary: No focal liver abnormality. No gallstones, gallbladder wall thickening, or pericholecystic fluid. No biliary dilatation. Pancreas: No focal lesion. Normal pancreatic contour. No surrounding inflammatory changes. No main pancreatic ductal dilatation. Spleen: CT Normal in size without focal abnormality. Adrenals/Urinary Tract: No adrenal nodule bilaterally. Status post right nephrectomy. No nephrolithiasis, no hydronephrosis, and no contour-deforming renal mass. No ureterolithiasis or hydroureter. The urinary bladder is unremarkable. Stomach/Bowel: Stomach is within normal limits. No evidence of bowel wall thickening or dilatation. The appendix not definitely identified. Vascular/Lymphatic: No abdominal aorta or iliac aneurysm. At least moderate atherosclerotic plaque of the aorta and its branches. No abdominal, pelvic, or inguinal lymphadenopathy. Reproductive: Prostate is unremarkable. Other: No intraperitoneal free fluid. No intraperitoneal free gas. No organized fluid collection. Musculoskeletal: No abdominal wall hernia or abnormality. No suspicious lytic or blastic osseous lesions. No acute displaced fracture. Multilevel degenerative changes of the spine. IMPRESSION: 1. No acute intra-abdominal or intrapelvic abnormality in a patient status post right nephrectomy. 2.  Aortic Atherosclerosis (ICD10-I70.0). Electronically Signed   By: Tish Frederickson M.D.   On: 07/24/2020 06:38    Procedures Procedures   Medications Ordered in ED Medications   LORazepam (ATIVAN) injection 2 mg (2 mg Intravenous Given 07/24/20 0543)    ED Course  I have reviewed the triage vital signs and the nursing notes.  Pertinent labs & imaging results that were available during my care of the patient were reviewed by me and considered in my medical decision making (see chart for details).    MDM Rules/Calculators/A&P                          Patient presents to the emergency department with complaints of abdominal pain, predominantly right-sided.  He also has had some nausea, vomiting and diarrhea.  Work-up has been reassuring.  This included a CAT scan that did not show any acute abnormality.  At arrival patient was very agitated.  He does have a history of alcohol and methamphetamine abuse.  Symptoms seem to have resolved after Ativan.  Reexamination revealed that he was sleeping comfortably, easily awakened.  No longer tremulous or agitated.  Specifically denies homicidality and suicidality.  He was evaluated by psychiatry yesterday prior to coming to the ED and was not felt to need inpatient care.  Final Clinical Impression(s) / ED Diagnoses Final diagnoses:  Generalized abdominal pain    Rx / DC Orders ED Discharge Orders    None       Thelonious Kauffmann, Canary Brim, MD 07/24/20 2831    Gilda Crease, MD 07/24/20 [phone]

## 2020-08-17 ENCOUNTER — Other Ambulatory Visit: Payer: Self-pay

## 2020-08-17 ENCOUNTER — Emergency Department (HOSPITAL_COMMUNITY)
Admission: EM | Admit: 2020-08-17 | Discharge: 2020-08-17 | Disposition: A | Discharge status: Home / Self Care | Payer: Self-pay | Attending: Emergency Medicine | Admitting: Emergency Medicine

## 2020-08-17 ENCOUNTER — Encounter (HOSPITAL_COMMUNITY): Payer: Self-pay

## 2020-08-17 DIAGNOSIS — F1510 Other stimulant abuse, uncomplicated: Secondary | ICD-10-CM | POA: Insufficient documentation

## 2020-08-17 DIAGNOSIS — F1910 Other psychoactive substance abuse, uncomplicated: Secondary | ICD-10-CM

## 2020-08-17 DIAGNOSIS — R112 Nausea with vomiting, unspecified: Secondary | ICD-10-CM

## 2020-08-17 DIAGNOSIS — F17210 Nicotine dependence, cigarettes, uncomplicated: Secondary | ICD-10-CM | POA: Insufficient documentation

## 2020-08-17 DIAGNOSIS — F1210 Cannabis abuse, uncomplicated: Secondary | ICD-10-CM | POA: Insufficient documentation

## 2020-08-17 DIAGNOSIS — F1410 Cocaine abuse, uncomplicated: Secondary | ICD-10-CM | POA: Insufficient documentation

## 2020-08-17 DIAGNOSIS — F1110 Opioid abuse, uncomplicated: Secondary | ICD-10-CM | POA: Insufficient documentation

## 2020-08-17 LAB — COMPREHENSIVE METABOLIC PANEL
ALT: 83 U/L — ABNORMAL HIGH (ref 0–44)
AST: 113 U/L — ABNORMAL HIGH (ref 15–41)
Albumin: 3.4 g/dL — ABNORMAL LOW (ref 3.5–5.0)
Alkaline Phosphatase: 68 U/L (ref 38–126)
Anion gap: 8 (ref 5–15)
BUN: 5 mg/dL — ABNORMAL LOW (ref 6–20)
CO2: 29 mmol/L (ref 22–32)
Calcium: 9 mg/dL (ref 8.9–10.3)
Chloride: 94 mmol/L — ABNORMAL LOW (ref 98–111)
Creatinine, Ser: 0.8 mg/dL (ref 0.61–1.24)
GFR, Estimated: >60 mL/min (ref >60)
Glucose, Bld: 128 mg/dL — ABNORMAL HIGH (ref 70–99)
Potassium: 3.9 mmol/L (ref 3.5–5.1)
Sodium: 131 mmol/L — ABNORMAL LOW (ref 135–145)
Total Bilirubin: 1.1 mg/dL (ref 0.3–1.2)
Total Protein: 8.4 g/dL — ABNORMAL HIGH (ref 6.5–8.1)

## 2020-08-17 LAB — CBC
HCT: 39.9 % (ref 39.0–52.0)
Hemoglobin: 13.3 g/dL (ref 13.0–17.0)
MCH: 33.3 pg (ref 26.0–34.0)
MCHC: 33.3 g/dL (ref 30.0–36.0)
MCV: 99.8 fL (ref 80.0–100.0)
Platelets: 332 10*3/uL (ref 150–400)
RBC: 4 MIL/uL — ABNORMAL LOW (ref 4.22–5.81)
RDW: 13.5 % (ref 11.5–15.5)
WBC: 17.9 10*3/uL — ABNORMAL HIGH (ref 4.0–10.5)
nRBC: 0 % (ref 0.0–0.2)

## 2020-08-17 LAB — TROPONIN I (HIGH SENSITIVITY)
Troponin I (High Sensitivity): 24 ng/L — ABNORMAL HIGH (ref <18)
Troponin I (High Sensitivity): 23 ng/L — ABNORMAL HIGH (ref <18)

## 2020-08-17 LAB — ETHANOL: Alcohol, Ethyl (B): <10 mg/dL (ref <10)

## 2020-08-17 MED ORDER — LORAZEPAM 2 MG/ML IJ SOLN
1.0000 mg | Freq: Once | INTRAMUSCULAR | Status: AC
  Administered 2020-08-17: 1 mg via INTRAVENOUS
  Filled 2020-08-17: qty 1

## 2020-08-17 MED ORDER — SODIUM CHLORIDE 0.9 % IV BOLUS
500.0000 mL | Freq: Once | INTRAVENOUS | Status: AC
  Administered 2020-08-17: 500 mL via INTRAVENOUS

## 2020-08-17 NOTE — Discharge Instructions (Signed)
Please read and follow all provided instructions.  Your diagnoses today include:  1. Polysubstance abuse (HCC)   2. Non-intractable vomiting with nausea, unspecified vomiting type     Tests performed today include: Blood cell counts (white, red, and platelets) -White blood cell count is high Electrolytes  Kidney function test Liver function test -elevated likely due to alcohol and drug use EKG -no changes Cardiac enzymes - do not show stress on your heart today  Vital signs. See below for your results today.   Medications prescribed:   None  Take any prescribed medications only as directed.  Home care instructions:  Follow any educational materials contained in this packet.  Your symptoms today were likely related to your drug use.  Please seek help for assistance in quitting these.  See attached referrals.  Follow-up instructions: Please follow-up with your primary care provider in the next 3 days for further evaluation of your symptoms.   Return instructions:   Please return to the Emergency Department if you experience worsening symptoms.   Please return if you have any other emergent concerns.  Additional Information:  Your vital signs today were: BP 124/69 (BP Location: Left Arm)   Pulse 65   Temp 98.8 F (37.1 C) (Oral)   Resp 12   SpO2 99%  If your blood pressure (BP) was elevated above 135/85 this visit, please have this repeated by your doctor within one month. --------------

## 2020-08-17 NOTE — ED Triage Notes (Signed)
Pt BIB [facility], he was picked up at [facility]. Pt reports snorting a "20 bag" of cocaine last night at 1130 pm. Concerned it was laced with fentanyl. Pt vomited 10-12 times but unsure exact number. C/o abd and BLE cramping   20g LFA   BP 160/105 HR 80 100% RA  RR 20 CBG 204

## 2020-08-17 NOTE — ED Provider Notes (Signed)
[facility] EMERGENCY DEPARTMENT Provider Note   CSN: [id number] Arrival date & time: 08/17/20  4098     History Chief Complaint  Patient presents with  . Drug Overdose    Jerry [name] is a 45 y.o. male.  Patient with history of polysubstance abuse presents to the emergency department today with feeling jittery and shaky, muscle cramps, vomiting after taking what he thought was a "20 bag" of cocaine last night shortly before midnight.  Because of the way he feels, he is not sure that that is what it was.  He has had cramping in abdomen and legs.  He reports vomiting multiple times.  He states that he has blurry vision but no loss of vision.  He also feels paranoid.  He denies chest pain or shortness of breath.  He is unsure if he had diarrhea.  States that he drinks frequently, denies alcohol use last night. Patient denies signs of stroke including: facial droop, slurred speech, aphasia, weakness/numbness in extremities, imbalance/trouble walking.         Past Medical History:  Diagnosis Date  . Anxiety   . Bipolar 1 disorder (HCC)   . Depression   . Hallucination   . PTSD (post-traumatic stress disorder)     Patient Active Problem List   Diagnosis Date Noted  . Major depressive disorder, recurrent severe without psychotic features (HCC) 07/17/2020  . Schizophrenia spectrum disorder with psychotic disorder type not yet determined (HCC) 07/17/2020  . Methamphetamine abuse (HCC) 07/17/2020  . Marijuana abuse 07/17/2020  . Cocaine abuse (HCC) 07/17/2020  . Anxiety and depression 06/25/2014  . Family history of diabetes mellitus (DM) 06/25/2014  . Family history of thyroid disease 06/25/2014  . Tobacco abuse 06/25/2014  . Alcohol abuse 05/18/2013  . Polysubstance abuse (HCC) 05/18/2013  . Depression, unspecified 05/18/2013    Past Surgical History:  Procedure Laterality Date  . KIDNEY DONATION Right 2003       Family History  Problem Relation  Age of Onset  . Heart disease Mother   . Cancer Mother   . Heart disease Father   . Diabetes Father   . Diabetes Maternal Grandmother   . Stroke Paternal Grandfather     Social History   Tobacco Use  . Smoking status: Current Every Day Smoker    Packs/day: 2.00    Types: Cigarettes  . Smokeless tobacco: Never Used  Substance Use Topics  . Alcohol use: Yes    Alcohol/week: 32.0 standard drinks    Types: 12 Cans of beer, 20 Shots of liquor per week    Comment:  last use was 1 month ago   . Drug use: Yes    Types: Marijuana, Cocaine, Heroin, Amphetamines, Methamphetamines    Comment: history of IVDA    Home Medications Prior to Admission medications   Medication Sig Start Date End Date Taking? Authorizing Provider  gabapentin (NEURONTIN) 100 MG capsule Take 1 capsule (100 mg total) by mouth 3 (three) times daily. For agitation 07/21/20   Armandina Stammer I, NP  nicotine (NICODERM CQ - DOSED IN MG/24 HOURS) 21 mg/24hr patch Place 1 patch (21 mg total) onto the skin daily. For smoking cessation Patient not taking: Reported on 07/24/2020 07/22/20   Armandina Stammer I, NP  risperiDONE (RISPERDAL) 1 MG tablet Take 1 tablet (1 mg total) by mouth at bedtime. For mood control 07/21/20   Armandina Stammer I, NP  traZODone (DESYREL) 50 MG tablet Take 1 tablet (50 mg total) by  mouth at bedtime as needed for sleep. 07/21/20   Sanjuana Kava, NP    Allergies    Penicillins  Review of Systems   Review of Systems  Constitutional: Positive for diaphoresis. Negative for fever.  HENT: Negative for rhinorrhea and sore throat.   Eyes: Positive for visual disturbance (blurry). Negative for redness.  Respiratory: Negative for cough.   Cardiovascular: Negative for chest pain.  Gastrointestinal: Positive for abdominal pain, nausea and vomiting.  Genitourinary: Negative for dysuria and hematuria.  Musculoskeletal: Positive for myalgias.  Skin: Negative for rash.  Neurological: Positive for tremors. Negative for  headaches.    Physical Exam Updated Vital Signs BP (!) 150/88 (BP Location: Left Arm)   Pulse 99   Temp 99 F (37.2 C) (Oral)   Resp 16   SpO2 100%   Physical Exam Vitals and nursing note reviewed.  Constitutional:      Appearance: He is well-developed.  HENT:     Head: Normocephalic and atraumatic.     Right Ear: Ear canal and external ear normal.     Left Ear: Ear canal and external ear normal.     Mouth/Throat:     Mouth: Mucous membranes are moist.  Eyes:     General:        Right eye: No discharge.        Left eye: No discharge.     Conjunctiva/sclera: Conjunctivae normal.  Cardiovascular:     Rate and Rhythm: Normal rate and regular rhythm.     Heart sounds: Normal heart sounds.  Pulmonary:     Effort: Pulmonary effort is normal.     Breath sounds: Normal breath sounds.  Abdominal:     Palpations: Abdomen is soft.     Tenderness: There is no abdominal tenderness. There is no guarding or rebound.  Musculoskeletal:     Cervical back: Normal range of motion and neck supple.     Right lower leg: No edema.     Left lower leg: No edema.  Skin:    General: Skin is warm and dry.  Neurological:     Mental Status: He is alert.     Cranial Nerves: Cranial nerves are intact. No dysarthria or facial asymmetry.     Sensory: No sensory deficit.     Motor: Tremor present.     Comments: Tremulous     ED Results / Procedures / Treatments   Labs (all labs ordered are listed, but only abnormal results are displayed) Labs Reviewed  COMPREHENSIVE METABOLIC PANEL - Abnormal; Notable for the following components:      Result Value   Sodium 131 (*)    Chloride 94 (*)    Glucose, Bld 128 (*)    BUN 5 (*)    Total Protein 8.4 (*)    Albumin 3.4 (*)    AST 113 (*)    ALT 83 (*)    All other components within normal limits  CBC - Abnormal; Notable for the following components:   WBC 17.9 (*)    RBC 4.00 (*)    All other components within normal limits  TROPONIN I (HIGH  SENSITIVITY) - Abnormal; Notable for the following components:   Troponin I (High Sensitivity) 24 (*)    All other components within normal limits  TROPONIN I (HIGH SENSITIVITY) - Abnormal; Notable for the following components:   Troponin I (High Sensitivity) 23 (*)    All other components within normal limits  ETHANOL  RAPID URINE DRUG  SCREEN, HOSP PERFORMED    EKG EKG Interpretation  Date/Time:  Tuesday Aug 17 2020 10:51:59 EDT Ventricular Rate:  75 PR Interval:  136 QRS Duration: 94 QT Interval:  386 QTC Calculation: 431 R Axis:   96 Text Interpretation: with marked sinus arrhythmia Rightward axis no significant change sinceApril 2022 Confirmed by Pricilla Loveless [phone]) on 08/17/2020 1:51:44 PM   Radiology No results found.  Procedures Procedures   Medications Ordered in ED Medications  sodium chloride 0.9 % bolus 500 mL (0 mLs Intravenous Stopped 08/17/20 1217)  LORazepam (ATIVAN) injection 1 mg (1 mg Intravenous Given 08/17/20 0949)    ED Course  I have reviewed the triage vital signs and the nursing notes.  Pertinent labs & imaging results that were available during my care of the patient were reviewed by me and considered in my medical decision making (see chart for details).  Patient seen and examined. Work-up initiated.  Reviewed triage labs.  Additional labs ordered.  I asked that patient be placed on cardiac monitoring.  Will obtain EKG.  Fluids and Ativan ordered.  Patient is not in distress but is very jittery and tremulous.  Pupils mildly constricted.  Awaiting lab work-up.  Vital signs reviewed and are as follows: BP (!) 150/88 (BP Location: Left Arm)   Pulse 99   Temp 99 F (37.2 C) (Oral)   Resp 16   SpO2 100%   1:52 PM Troponin 24 > 23. Will PO challenge, given ginger ale.   Patient rechecked, he was initially asleep.  No tremors noted.  I woke him up and discussed lab work to this point.  I think that he will be able to go home.  He has not  provided urine, however I think this is likely not going to be very contributory.  Vital signs are stable and he has not had any worsening over the past 5 hours.  2:33 PM patient stable.  Plan for discharge.  Substance abuse referrals given.    MDM Rules/Calculators/A&P                          Patient with symptoms today likely stemming from drug use last night.  Cardiac work-up here is reassuring.  White blood cell count is elevated, however no definite source of infection noted.  Patient is not tachycardic, hypotensive, febrile.  May be reactive.  Patient appears well, nontoxic.  Symptoms are controlled during ED stay.  He is tolerating oral fluids.  Plan for discharge.  He is encouraged to seek help for drug use and avoid drugs and alcohol.  Final Clinical Impression(s) / ED Diagnoses Final diagnoses:  Polysubstance abuse (HCC)  Non-intractable vomiting with nausea, unspecified vomiting type    Rx / DC Orders ED Discharge Orders    None       Renne Crigler, PA-C 08/17/20 1434    Pricilla Loveless, MD 08/21/20 0045

## 2020-09-06 ENCOUNTER — Encounter (HOSPITAL_COMMUNITY): Payer: Self-pay

## 2020-09-06 ENCOUNTER — Other Ambulatory Visit: Payer: Self-pay

## 2020-09-06 ENCOUNTER — Emergency Department (HOSPITAL_COMMUNITY)
Admission: EM | Admit: 2020-09-06 | Discharge: 2020-09-07 | Disposition: A | Discharge status: Home / Self Care | Payer: Self-pay | Attending: Emergency Medicine | Admitting: Emergency Medicine

## 2020-09-06 DIAGNOSIS — F1210 Cannabis abuse, uncomplicated: Secondary | ICD-10-CM | POA: Diagnosis present

## 2020-09-06 DIAGNOSIS — F17210 Nicotine dependence, cigarettes, uncomplicated: Secondary | ICD-10-CM | POA: Insufficient documentation

## 2020-09-06 DIAGNOSIS — Z765 Malingerer [conscious simulation]: Secondary | ICD-10-CM

## 2020-09-06 DIAGNOSIS — F1510 Other stimulant abuse, uncomplicated: Secondary | ICD-10-CM | POA: Diagnosis present

## 2020-09-06 DIAGNOSIS — R000 Tachycardia, unspecified: Secondary | ICD-10-CM | POA: Insufficient documentation

## 2020-09-06 DIAGNOSIS — F1924 Other psychoactive substance dependence with psychoactive substance-induced mood disorder: Secondary | ICD-10-CM | POA: Insufficient documentation

## 2020-09-06 DIAGNOSIS — U071 COVID-19: Secondary | ICD-10-CM | POA: Insufficient documentation

## 2020-09-06 DIAGNOSIS — F1410 Cocaine abuse, uncomplicated: Secondary | ICD-10-CM | POA: Diagnosis present

## 2020-09-06 DIAGNOSIS — R45851 Suicidal ideations: Secondary | ICD-10-CM | POA: Insufficient documentation

## 2020-09-06 DIAGNOSIS — F1994 Other psychoactive substance use, unspecified with psychoactive substance-induced mood disorder: Secondary | ICD-10-CM | POA: Diagnosis present

## 2020-09-06 DIAGNOSIS — F1010 Alcohol abuse, uncomplicated: Secondary | ICD-10-CM | POA: Diagnosis present

## 2020-09-06 DIAGNOSIS — Z5900 Homelessness unspecified: Secondary | ICD-10-CM

## 2020-09-06 DIAGNOSIS — Y900 Blood alcohol level of less than 20 mg/100 ml: Secondary | ICD-10-CM | POA: Insufficient documentation

## 2020-09-06 DIAGNOSIS — F1910 Other psychoactive substance abuse, uncomplicated: Secondary | ICD-10-CM | POA: Diagnosis present

## 2020-09-06 LAB — ETHANOL: Alcohol, Ethyl (B): <10 mg/dL (ref <10)

## 2020-09-06 LAB — COMPREHENSIVE METABOLIC PANEL
ALT: 66 U/L — ABNORMAL HIGH (ref 0–44)
AST: 68 U/L — ABNORMAL HIGH (ref 15–41)
Albumin: 3.6 g/dL (ref 3.5–5.0)
Alkaline Phosphatase: 65 U/L (ref 38–126)
Anion gap: 9 (ref 5–15)
BUN: 9 mg/dL (ref 6–20)
CO2: 26 mmol/L (ref 22–32)
Calcium: 9.4 mg/dL (ref 8.9–10.3)
Chloride: 97 mmol/L — ABNORMAL LOW (ref 98–111)
Creatinine, Ser: 1.03 mg/dL (ref 0.61–1.24)
GFR, Estimated: >60 mL/min (ref >60)
Glucose, Bld: 90 mg/dL (ref 70–99)
Potassium: 3.6 mmol/L (ref 3.5–5.1)
Sodium: 132 mmol/L — ABNORMAL LOW (ref 135–145)
Total Bilirubin: 1.6 mg/dL — ABNORMAL HIGH (ref 0.3–1.2)
Total Protein: 8.5 g/dL — ABNORMAL HIGH (ref 6.5–8.1)

## 2020-09-06 LAB — RESP PANEL BY RT-PCR (FLU A&B, COVID) ARPGX2
Influenza A by PCR: NEGATIVE
Influenza B by PCR: NEGATIVE
SARS Coronavirus 2 by RT PCR: POSITIVE — AB

## 2020-09-06 LAB — CBC
HCT: 40.6 % (ref 39.0–52.0)
Hemoglobin: 13.8 g/dL (ref 13.0–17.0)
MCH: 33.4 pg (ref 26.0–34.0)
MCHC: 34 g/dL (ref 30.0–36.0)
MCV: 98.3 fL (ref 80.0–100.0)
Platelets: 408 10*3/uL — ABNORMAL HIGH (ref 150–400)
RBC: 4.13 MIL/uL — ABNORMAL LOW (ref 4.22–5.81)
RDW: 12.8 % (ref 11.5–15.5)
WBC: 5.6 10*3/uL (ref 4.0–10.5)
nRBC: 0 % (ref 0.0–0.2)

## 2020-09-06 LAB — SALICYLATE LEVEL: Salicylate Lvl: <7 mg/dL — ABNORMAL LOW (ref 7.0–30.0)

## 2020-09-06 LAB — ACETAMINOPHEN LEVEL: Acetaminophen (Tylenol), Serum: <10 ug/mL — ABNORMAL LOW (ref 10–30)

## 2020-09-06 MED ORDER — LORAZEPAM 2 MG/ML IJ SOLN: 0.0000 mg | Freq: Two times a day (BID) | INTRAMUSCULAR | Status: DC

## 2020-09-06 MED ORDER — LORAZEPAM 1 MG PO TABS: 0.0000 mg | ORAL_TABLET | Freq: Two times a day (BID) | ORAL | Status: DC

## 2020-09-06 MED ORDER — NICOTINE 21 MG/24HR TD PT24
21.0000 mg | MEDICATED_PATCH | Freq: Every day | TRANSDERMAL | Status: DC
  Administered 2020-09-07: 21 mg via TRANSDERMAL
  Filled 2020-09-06 (×2): qty 1

## 2020-09-06 MED ORDER — THIAMINE HCL 100 MG/ML IJ SOLN: 100.0000 mg | Freq: Every day | INTRAMUSCULAR | Status: DC

## 2020-09-06 MED ORDER — THIAMINE HCL 100 MG PO TABS
100.0000 mg | ORAL_TABLET | Freq: Every day | ORAL | Status: DC
  Administered 2020-09-06 – 2020-09-07 (×2): 100 mg via ORAL
  Filled 2020-09-06 (×2): qty 1

## 2020-09-06 MED ORDER — LORAZEPAM 2 MG/ML IJ SOLN
0.0000 mg | Freq: Four times a day (QID) | INTRAMUSCULAR | Status: DC
Start: 2020-09-06 — End: 2020-09-07
  Administered 2020-09-06 (×2): 2 mg via INTRAVENOUS
  Administered 2020-09-07: 1 mg via INTRAVENOUS
  Filled 2020-09-06 (×2): qty 1

## 2020-09-06 MED ORDER — LORAZEPAM 1 MG PO TABS
0.0000 mg | ORAL_TABLET | Freq: Four times a day (QID) | ORAL | Status: DC
  Administered 2020-09-06: 2 mg via ORAL
  Administered 2020-09-07: 1 mg via ORAL
  Filled 2020-09-06: qty 1
  Filled 2020-09-06: qty 2
  Filled 2020-09-06: qty 1

## 2020-09-06 MED ORDER — SODIUM CHLORIDE 0.9 % IV BOLUS
1000.0000 mL | Freq: Once | INTRAVENOUS | Status: AC
  Administered 2020-09-06: 1000 mL via INTRAVENOUS

## 2020-09-06 NOTE — ED Provider Notes (Signed)
[facility] EMERGENCY DEPARTMENT Provider Note   CSN: [id number] Arrival date & time: 09/06/20  3094     History Chief Complaint  Patient presents with   Psychiatric Evaluation    JIA MOHAMED is a 45 y.o. male.  The history is provided by the patient.      ROLONDO PIERRE is a 45 y.o. male, with a history of polysubstance abuse, EtOH abuse, bipolar, schizophrenia, presenting to the ED with suicidal ideations as well as hallucinations.  He states he has had worsened auditory and visual hallucinations over the past few days as well as a couple attempts at suicide by jumping in front of cars.     Plan:  He states, "I tried to kill myself by jumping in front of cars, but they tend to stop too quickly."  Support system: States he recently split up with his wife and has been on the streets for the last several days.  Drug/alcohol use: Endorses use of cocaine, methamphetamine, THC, heroin with last use in the last 24 hours. Endorses drinking 2 fifths of liquor daily with last drink 2 days ago.  Medication compliance:  He states he has not been taking his medications for his paranoid schizophrenia for a couple years stating financial difficulty obtaining his medications.  Physical complaints:   He states, "I have a group of people that are after me.  They show up and are very angry.  I am not sure what they want or why they are so angry.  I also hear voices telling me they are going to kill me.  These things feel like hallucinations."  He endorses nausea, dry heaves, and tremors over the last couple days.  Denies any recent seizures.   Past Medical History:  Diagnosis Date   Anxiety    Bipolar 1 disorder (HCC)    Depression    Hallucination    PTSD (post-traumatic stress disorder)     Patient Active Problem List   Diagnosis Date Noted   Major depressive disorder, recurrent severe without psychotic features (HCC) 07/17/2020   Schizophrenia spectrum  disorder with psychotic disorder type not yet determined (HCC) 07/17/2020   Methamphetamine abuse (HCC) 07/17/2020   Marijuana abuse 07/17/2020   Cocaine abuse (HCC) 07/17/2020   Anxiety and depression 06/25/2014   Family history of diabetes mellitus (DM) 06/25/2014   Family history of thyroid disease 06/25/2014   Tobacco abuse 06/25/2014   Alcohol abuse 05/18/2013   Polysubstance abuse (HCC) 05/18/2013   Depression, unspecified 05/18/2013    Past Surgical History:  Procedure Laterality Date   KIDNEY DONATION Right 2003       Family History  Problem Relation Age of Onset   Heart disease Mother    Cancer Mother    Heart disease Father    Diabetes Father    Diabetes Maternal Grandmother    Stroke Paternal Grandfather     Social History   Tobacco Use   Smoking status: Every Day    Packs/day: 2.00    Pack years: 0.00    Types: Cigarettes   Smokeless tobacco: Never  Substance Use Topics   Alcohol use: Yes    Alcohol/week: 32.0 standard drinks    Types: 12 Cans of beer, 20 Shots of liquor per week    Comment:  last use was 1 month ago    Drug use: Yes    Types: Marijuana, Cocaine, Heroin, Amphetamines, Methamphetamines    Comment: history of IVDA    Home  Medications Prior to Admission medications   Medication Sig Start Date End Date Taking? Authorizing Provider  gabapentin (NEURONTIN) 100 MG capsule Take 1 capsule (100 mg total) by mouth 3 (three) times daily. For agitation 07/21/20   Armandina Stammer I, NP  nicotine (NICODERM CQ - DOSED IN MG/24 HOURS) 21 mg/24hr patch Place 1 patch (21 mg total) onto the skin daily. For smoking cessation Patient not taking: Reported on 07/24/2020 07/22/20   Armandina Stammer I, NP  risperiDONE (RISPERDAL) 1 MG tablet Take 1 tablet (1 mg total) by mouth at bedtime. For mood control 07/21/20   Armandina Stammer I, NP  traZODone (DESYREL) 50 MG tablet Take 1 tablet (50 mg total) by mouth at bedtime as needed for sleep. 07/21/20   Armandina Stammer I, NP     Allergies    Penicillins  Review of Systems   Review of Systems  Constitutional:  Negative for chills, diaphoresis and fever.  Respiratory:  Negative for shortness of breath.   Cardiovascular:  Negative for chest pain.  Gastrointestinal:  Positive for nausea. Negative for abdominal pain, diarrhea and vomiting.  Neurological:  Positive for tremors. Negative for seizures, syncope and weakness.  Psychiatric/Behavioral:  Positive for dysphoric mood, hallucinations and suicidal ideas. The patient is nervous/anxious.   All other systems reviewed and are negative.  Physical Exam Updated Vital Signs BP (!) 142/78 (BP Location: Right Arm)   Pulse (!) 119   Temp 98 F (36.7 C) (Oral)   Resp 18   Ht 5\' 9"  (1.753 m)   Wt 68.9 kg   SpO2 99%   BMI 22.45 kg/m   Physical Exam Vitals and nursing note reviewed.  Constitutional:      General: He is not in acute distress.    Appearance: He is well-developed. He is not diaphoretic.  HENT:     Head: Normocephalic and atraumatic.     Mouth/Throat:     Mouth: Mucous membranes are moist.     Pharynx: Oropharynx is clear.  Eyes:     Conjunctiva/sclera: Conjunctivae normal.  Cardiovascular:     Rate and Rhythm: Regular rhythm. Tachycardia present.     Pulses: Normal pulses.          Radial pulses are 2+ on the right side and 2+ on the left side.       Posterior tibial pulses are 2+ on the right side and 2+ on the left side.     Heart sounds: Normal heart sounds.     Comments: Tactile temperature in the extremities appropriate and equal bilaterally. Pulmonary:     Effort: Pulmonary effort is normal. No respiratory distress.     Breath sounds: Normal breath sounds.  Abdominal:     Palpations: Abdomen is soft.     Tenderness: There is no abdominal tenderness. There is no guarding.  Musculoskeletal:     Cervical back: Neck supple.     Right lower leg: No edema.     Left lower leg: No edema.  Lymphadenopathy:     Cervical: No cervical  adenopathy.  Skin:    General: Skin is warm and dry.  Neurological:     Mental Status: He is alert and oriented to person, place, and time.     Comments: Resting tremor noted in the upper extremities.  Psychiatric:        Mood and Affect: Mood and affect normal.        Speech: Speech normal.        Behavior: Behavior  normal.     Comments: Patient appears to be anxious, but is able to carry on a conversation.  He freely answers all questions.    ED Results / Procedures / Treatments   Labs (all labs ordered are listed, but only abnormal results are displayed) Labs Reviewed  COMPREHENSIVE METABOLIC PANEL - Abnormal; Notable for the following components:      Result Value   Sodium 132 (*)    Chloride 97 (*)    Total Protein 8.5 (*)    AST 68 (*)    ALT 66 (*)    Total Bilirubin 1.6 (*)    All other components within normal limits  SALICYLATE LEVEL - Abnormal; Notable for the following components:   Salicylate Lvl <7.0 (*)    All other components within normal limits  ACETAMINOPHEN LEVEL - Abnormal; Notable for the following components:   Acetaminophen (Tylenol), Serum <10 (*)    All other components within normal limits  CBC - Abnormal; Notable for the following components:   RBC 4.13 (*)    Platelets 408 (*)    All other components within normal limits  RESP PANEL BY RT-PCR (FLU A&B, COVID) ARPGX2  ETHANOL  RAPID URINE DRUG SCREEN, HOSP PERFORMED    EKG None  Radiology No results found.  Procedures Procedures   Medications Ordered in ED Medications  LORazepam (ATIVAN) injection 0-4 mg (2 mg Intravenous Given 09/06/20 1001)    Or  LORazepam (ATIVAN) tablet 0-4 mg ( Oral See Alternative 09/06/20 1001)  LORazepam (ATIVAN) injection 0-4 mg (has no administration in time range)    Or  LORazepam (ATIVAN) tablet 0-4 mg (has no administration in time range)  thiamine tablet 100 mg (100 mg Oral Given 09/06/20 1259)    Or  thiamine (B-1) injection 100 mg ( Intravenous See  Alternative 09/06/20 1259)  nicotine (NICODERM CQ - dosed in mg/24 hours) patch 21 mg (21 mg Transdermal Patient Refused/Not Given 09/06/20 1300)  sodium chloride 0.9 % bolus 1,000 mL (0 mLs Intravenous Stopped 09/06/20 1247)    ED Course  I have reviewed the triage vital signs and the nursing notes.  Pertinent labs & imaging results that were available during my care of the patient were reviewed by me and considered in my medical decision making (see chart for details).    MDM Rules/Calculators/A&P                          Patient presents with complaints of suicidal ideations as well as what he thinks are hallucinations from his schizophrenia. He does have symptoms consistent with alcohol withdrawal, however, he has not presented with life-threatening abnormalities, such as seizure, altered mental status, focal neurologic dysfunction. His symptoms associated with alcohol withdrawal significantly improved with the Ativan and patient seemed to rest comfortably throughout the rest of his time under my care.  We checked on and reevaluated the patient multiple times and he was understandably sleepy given his recent lack of restful sleep combined with the Ativan.  End of shift patient care handoff report given to Jodi Geralds, PA-C. Plan: Patient still needs TTS evaluation.  Findings and plan of care discussed with attending physician, Susy Frizzle, MD. Dr. Bernette Mayers personally evaluated and examined this patient.  Final Clinical Impression(s) / ED Diagnoses Final diagnoses:  Suicidal ideations    Rx / DC Orders ED Discharge Orders     None        Uvaldo Rybacki C, PA-C  09/06/20 1540    Pollyann SavoySheldon, Charles B, MD 09/07/20 1249

## 2020-09-06 NOTE — ED Triage Notes (Signed)
Patient BIB [facility] from [facility], hx of paranoid schizophrenia, has been having visual hallucations, has been off his meds x 2 years due to cost, reports SI with plan to run in front of car, hx of depression, has been doing drugs (cocaine, heroin, etc.)  HR 100 162/100 98% RA CBG 95
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
## 2020-09-06 NOTE — ED Notes (Signed)
Pt keeps asking to talk to the "ambulance driver" about taking him to a different hospital. Pt refused to change into purple scrubs. Pt is sitting in triage.

## 2020-09-06 NOTE — BH Assessment (Addendum)
Secure chat message sent to pt's RN with request for TTS consult set up

## 2020-09-06 NOTE — ED Provider Notes (Signed)
Care assumed from [facility], please see his note for full details, but in brief Jerry [name] is a 45 y.o. male presents with suicidal ideations and alcohol withdrawal.  Patient was hypertensive, mildly tachycardic and tremulous, received 2 mg of Ativan with improvement.  He was also having some hallucinations but has known history of schizophrenia, this could be related to withdrawal versus underlying schizophrenia.  Patient reports SI with plan and attempts to run into traffic.  Currently alcohol withdrawal symptoms are improving patient does not need medical admission for withdrawal at this time, currently medically cleared pending TTS evaluation.  BP (!) 153/129 (BP Location: Right Arm)   Pulse 89   Temp 98 F (36.7 C) (Oral)   Resp 17   Ht 5\' 9"  (1.753 m)   Wt 68.9 kg   SpO2 94%   BMI 22.45 kg/m    ED Course/Procedures   Labs Reviewed  COMPREHENSIVE METABOLIC PANEL - Abnormal; Notable for the following components:      Result Value   Sodium 132 (*)    Chloride 97 (*)    Total Protein 8.5 (*)    AST 68 (*)    ALT 66 (*)    Total Bilirubin 1.6 (*)    All other components within normal limits  SALICYLATE LEVEL - Abnormal; Notable for the following components:   Salicylate Lvl <7.0 (*)    All other components within normal limits  ACETAMINOPHEN LEVEL - Abnormal; Notable for the following components:   Acetaminophen (Tylenol), Serum <10 (*)    All other components within normal limits  CBC - Abnormal; Notable for the following components:   RBC 4.13 (*)    Platelets 408 (*)    All other components within normal limits  RESP PANEL BY RT-PCR (FLU A&B, COVID) [id number]  ETHANOL  RAPID URINE DRUG SCREEN, HOSP PERFORMED     Procedures  MDM   TTS is seen and evaluated patient, they recommend overnight observation and reevaluation in the morning.  Initially it was recommended that patient be transferred to be held for this observation, but he has tested positive for COVID and  therefore will have to stay in the emergency department.  Patient remains on CIWA protocol.  I have discussed this plan with him he expresses understanding and agreement.        09/06/20 1855    09/08/20, MD 09/13/20 [phone]

## 2020-09-06 NOTE — ED Notes (Signed)
Patient continues to ask for a shower. Advised that we would work with him on that after TTS was complete. Patient agreeable. CIWA score of 13- 2MG  of ativan given PO.

## 2020-09-07 ENCOUNTER — Encounter (HOSPITAL_COMMUNITY): Payer: Self-pay | Admitting: Registered Nurse

## 2020-09-07 DIAGNOSIS — U071 COVID-19: Secondary | ICD-10-CM

## 2020-09-07 DIAGNOSIS — F1994 Other psychoactive substance use, unspecified with psychoactive substance-induced mood disorder: Secondary | ICD-10-CM | POA: Diagnosis present

## 2020-09-07 DIAGNOSIS — Z5900 Homelessness unspecified: Secondary | ICD-10-CM

## 2020-09-07 DIAGNOSIS — Z765 Malingerer [conscious simulation]: Secondary | ICD-10-CM

## 2020-09-07 NOTE — Consult Note (Addendum)
Telepsych Consultation   Reason for Consult:  Suicidal ideation Referring Physician:  Layla Maw Location of Patient: [facility] ED Location of Provider: Other: [facility]  Patient Identification: Jerry [name] MRN:  [id number] Principal Diagnosis: Substance induced mood disorder ([place]) Diagnosis:  Principal Problem:   Substance induced mood disorder ([place]) Active Problems:   Alcohol abuse   Polysubstance abuse ([place])   Methamphetamine abuse ([place])   Marijuana abuse   Cocaine abuse ([place])   Malingering   Homelessness   Total Time spent with patient: 30 minutes  Subjective:   Jerry [name] is a 45 y.o. male patient admitted to [facility] ED after presenting with complaints of suicidal ideation, alcohol withdrawal, and suicidal ideation.  HPI:  Jerry [name], 45 y.o., male patient seen via tele health by this provider, consulted with Dr. Hampton Abbot; and chart reviewed on 09/07/20.  On evaluation Jerry [name] reports he came to the hospital because he thought people were trying to kill him.  States he is not sure who the people are.  Patient asked about his recent psychiatric hospitalization and follow up with outpatient psychiatric services.  Patient states he has not followed up with any outpatient psychiatric services or taken any psychotropic medications that have been prescribed.  "I don't have no money or insurance to pay for medications."  Patient informed at [facility] he did not need insurance and they would assist with getting his medication.  Patient then stated, "I don't have no way to get there."  Patient states he is feeling bad and that he doesn't like being in the hallway.  Patient states he needs to get a [place] bust ticket to get him to [place], the state "No, I need to go to [place]."  Patient reports he is hearing voices but unable to elaborate what voices are saying.  "I hear 'em all the time off and on.  The voices just carry on a normal conversation with me."  Patient doesn't appear  to be responding to internal or external stimuli at this time but continues to endorse auditory hallucinations.  Patient admits to polysubstance abuse "meth, cocaine, and weed." Patient also states that he is drinking Vodka daily.  "I drink 2 fifths of Vodka every day."  Patient states he is unemployed.  States he has been unemployed and homeless for 5 days after he was kicked out to he home by his wife.  On record/chart review it appears that patient has been homeless for at least a year.  Patient states he has not had any alcohol in last 3 days.  Record review shows that out of patients last 4 visits ETOH level < 10.  Patient is not currently showing any symptoms of alcohol withdrawal.           During evaluation Jerry [name] is elevated up in bed in no acute distress.  He is alert, oriented x 4.  Patient was  calm and cooperative throughout assessment.  His mood is euthymic with congruent affect; although he is complaining of depression. Patient doesn't appear to be responding to internal/external stimuli or delusional thoughts; but he continues to endorse auditory hallucinations that occur on/off.  Patient denies homicidal ideation. Patient has made statements of being paranoid feeling like someone is trying to kill him and that is the reason he came to the hospital.  Patient answered question appropriately.   Past Psychiatric History: See above  Risk to Self:  No Risk to Others:  No Prior Inpatient Therapy:  Yes Prior Outpatient Therapy:  Yes  Past Medical History:  Past Medical History:  Diagnosis Date   Anxiety    Bipolar 1 disorder ([place])    Depression    Hallucination    PTSD (post-traumatic stress disorder)     Past Surgical History:  Procedure Laterality Date   KIDNEY DONATION Right 2003   Family History:  Family History  Problem Relation Age of Onset   Heart disease Mother    Cancer Mother    Heart disease Father    Diabetes Father    Diabetes Maternal Grandmother     Stroke Paternal Grandfather    Family Psychiatric  History: Denies Social History:  Social History   Substance and Sexual Activity  Alcohol Use Yes   Alcohol/week: 32.0 standard drinks   Types: 12 Cans of beer, 20 Shots of liquor per week   Comment:  last use was 1 month ago      Social History   Substance and Sexual Activity  Drug Use Yes   Types: Marijuana, Cocaine, Heroin, Amphetamines, Methamphetamines   Comment: history of IVDA    Social History   Socioeconomic History   Marital status: Married    Spouse name: Not on file   Number of children: Not on file   Years of education: Not on file   Highest education level: Not on file  Occupational History   Not on file  Tobacco Use   Smoking status: Every Day    Packs/day: 2.00    Pack years: 0.00    Types: Cigarettes   Smokeless tobacco: Never  Substance and Sexual Activity   Alcohol use: Yes    Alcohol/week: 32.0 standard drinks    Types: 12 Cans of beer, 20 Shots of liquor per week    Comment:  last use was 1 month ago    Drug use: Yes    Types: Marijuana, Cocaine, Heroin, Amphetamines, Methamphetamines    Comment: history of IVDA   Sexual activity: Yes  Other Topics Concern   Not on file  Social History Narrative   Not on file   Social Determinants of Health   Financial Resource Strain: Not on file  Food Insecurity: Not on file  Transportation Needs: Not on file  Physical Activity: Not on file  Stress: Not on file  Social Connections: Not on file   Additional Social History:    Allergies:   Allergies  Allergen Reactions   Penicillins Other (See Comments)    Reaction occurred in childhood    Labs:  Results for orders placed or performed during the hospital encounter of 09/06/20 (from the past 48 hour(s))  Comprehensive metabolic panel     Status: Abnormal   Collection Time: 09/06/20  7:02 AM  Result Value Ref Range   Sodium 132 (L) 135 - 145 mmol/L   Potassium 3.6 3.5 - 5.1 mmol/L    Chloride 97 (L) 98 - 111 mmol/L   CO2 26 22 - 32 mmol/L   Glucose, Bld 90 70 - 99 mg/dL    Comment: Glucose reference range applies only to samples taken after fasting for at least 8 hours.   BUN 9 6 - 20 mg/dL   Creatinine, Ser 1.03 0.61 - 1.24 mg/dL   Calcium 9.4 8.9 - 10.3 mg/dL   Total Protein 8.5 (H) 6.5 - 8.1 g/dL   Albumin 3.6 3.5 - 5.0 g/dL   AST 68 (H) 15 - 41 U/L   ALT 66 (  H) 0 - 44 U/L   Alkaline Phosphatase 65 38 - 126 U/L   Total Bilirubin 1.6 (H) 0.3 - 1.2 mg/dL   GFR, Estimated >60 >60 mL/min    Comment: (NOTE) Calculated using the CKD-EPI Creatinine Equation (2021)    Anion gap 9 5 - 15    Comment: Performed at [facility] [address], [place], [place] [address]  Ethanol     Status: None   Collection Time: 09/06/20  7:02 AM  Result Value Ref Range   Alcohol, Ethyl (B) <10 <10 mg/dL    Comment: (NOTE) Lowest detectable limit for serum alcohol is 10 mg/dL.  For medical purposes only. Performed at [facility], [place] [address], [place], [place] [address]   Salicylate level     Status: Abnormal   Collection Time: 09/06/20  7:02 AM  Result Value Ref Range   Salicylate Lvl <5.7 (L) 7.0 - 30.0 mg/dL    Comment: Performed at [facility] [address] [place], [place] [address]  Acetaminophen level     Status: Abnormal   Collection Time: 09/06/20  7:02 AM  Result Value Ref Range   Acetaminophen (Tylenol), Serum <10 (L) 10 - 30 ug/mL    Comment: (NOTE) Therapeutic concentrations vary significantly. A range of 10-30 ug/mL  may be an effective concentration for many patients. However, some  are best treated at concentrations outside of this range. Acetaminophen concentrations >150 ug/mL at 4 hours after ingestion  and >50 ug/mL at 12 hours after ingestion are often associated with  toxic reactions.  Performed at [facility], [place] [address], [place], [place] [address]   cbc     Status: Abnormal   Collection Time: 09/06/20  7:02 AM   Result Value Ref Range   WBC 5.6 4.0 - 10.5 K/uL   RBC 4.13 (L) 4.22 - 5.81 MIL/uL   Hemoglobin 13.8 13.0 - 17.0 g/dL   HCT 40.6 39.0 - 52.0 %   MCV 98.3 80.0 - 100.0 fL   MCH 33.4 26.0 - 34.0 pg   MCHC 34.0 30.0 - 36.0 g/dL   RDW 12.8 11.5 - 15.5 %   Platelets 408 (H) 150 - 400 K/uL   nRBC 0.0 0.0 - 0.2 %    Comment: Performed at [facility], [place] [address], [place], [place] [address]  Resp Panel by RT-PCR (Flu A&B, Covid) Nasopharyngeal Swab     Status: Abnormal   Collection Time: 09/06/20  9:20 AM   Specimen: Nasopharyngeal Swab; Nasopharyngeal(NP) swabs in vial transport medium  Result Value Ref Range   SARS Coronavirus 2 by RT PCR POSITIVE (A) NEGATIVE    Comment: RESULT CALLED TO, READ BACK BY AND VERIFIED WITH: RN [id number] TAMMY B. [id number] FCP (NOTE) SARS-CoV-2 target nucleic acids are DETECTED.  The SARS-CoV-2 RNA is generally detectable in upper respiratory specimens during the acute phase of infection. Positive results are indicative of the presence of the identified virus, but do not rule out bacterial infection or co-infection with other pathogens not detected by the test. Clinical correlation with patient history and other diagnostic information is necessary to determine patient infection status. The expected result is Negative.  Fact Sheet for Patients: EntrepreneurPulse.com.au  Fact Sheet for Healthcare Providers: IncredibleEmployment.be  This test is not yet approved or cleared by the [place] FDA and  has been authorized for detection and/or diagnosis of SARS-CoV-2 by FDA under an Emergency Use Authorization (EUA).  This EUA will remain in effect (meaning this test  can be used) for the duration of  the COVID-19 declaration under Section 564(b)(1) of the Act, 21 U.S.C. section 360bbb-3(b)(1), unless the authorization is terminated or revoked sooner.     Influenza A by PCR NEGATIVE NEGATIVE    Influenza B by PCR NEGATIVE NEGATIVE    Comment: (NOTE) The Xpert Xpress SARS-CoV-2/FLU/RSV plus assay is intended as an aid in the diagnosis of influenza from Nasopharyngeal swab specimens and should not be used as a sole basis for treatment. Nasal washings and aspirates are unacceptable for Xpert Xpress SARS-CoV-2/FLU/RSV testing.  Fact Sheet for Patients: EntrepreneurPulse.com.au  Fact Sheet for Healthcare Providers: IncredibleEmployment.be  This test is not yet approved or cleared by the [place] FDA and has been authorized for detection and/or diagnosis of SARS-CoV-2 by FDA under an Emergency Use Authorization (EUA). This EUA will remain in effect (meaning this test can be used) for the duration of the COVID-19 declaration under Section 564(b)(1) of the Act, 21 U.S.C. section 360bbb-3(b)(1), unless the authorization is terminated or revoked.  Performed at [facility], [place] [address] [place], [place] [address]     Medications:  Current Facility-Administered Medications  Medication Dose Route Frequency Provider Last Rate Last Admin   LORazepam (ATIVAN) injection 0-4 mg  0-4 mg Intravenous Q6H Joy, Shawn C, PA-C   1 mg at 09/07/20 0630   Or   LORazepam (ATIVAN) tablet 0-4 mg  0-4 mg Oral Q6H Joy, Shawn C, PA-C   1 mg at 09/07/20 0548   [START ON 09/08/2020] LORazepam (ATIVAN) injection 0-4 mg  0-4 mg Intravenous Q12H Joy, Shawn C, PA-C       Or   [START ON 09/08/2020] LORazepam (ATIVAN) tablet 0-4 mg  0-4 mg Oral Q12H Joy, Shawn C, PA-C       nicotine (NICODERM CQ - dosed in mg/24 hours) patch 21 mg  21 mg Transdermal Daily Joy, Shawn C, PA-C   21 mg at 09/07/20 1601   thiamine tablet 100 mg  100 mg Oral Daily Joy, Shawn C, PA-C   100 mg at 09/07/20 0932   Or   thiamine (B-1) injection 100 mg  100 mg Intravenous Daily Joy, Shawn C, PA-C       Current Outpatient Medications  Medication Sig Dispense Refill   gabapentin  (NEURONTIN) 100 MG capsule Take 1 capsule (100 mg total) by mouth 3 (three) times daily. For agitation 90 capsule 0   nicotine (NICODERM CQ - DOSED IN MG/24 HOURS) 21 mg/24hr patch Place 1 patch (21 mg total) onto the skin daily. For smoking cessation 1 patch 0   risperiDONE (RISPERDAL) 1 MG tablet Take 1 tablet (1 mg total) by mouth at bedtime. For mood control 30 tablet 0   traZODone (DESYREL) 50 MG tablet Take 1 tablet (50 mg total) by mouth at bedtime as needed for sleep. 30 tablet 0    Musculoskeletal: Strength & Muscle Tone: within normal limits Gait & Station: normal Patient leans: N/A   Psychiatric Specialty Exam:  Presentation  General Appearance: Appropriate for Environment  Eye Contact:Good  Speech:Clear and Coherent; Normal Rate  Speech Volume:Normal  Handedness:Right   Mood and Affect  Mood:Dysphoric  Affect:Congruent   Thought Process  Thought Processes:Coherent; Goal Directed  Descriptions of Associations:Intact  Orientation:Full (Time, Place and Person)  Thought Content:WDL  History of Schizophrenia/Schizoaffective disorder:Yes  Duration of Psychotic Symptoms:Greater than six months  Hallucinations:Hallucinations: Auditory Description of Auditory Hallucinations: Reporting chronic  auditory hallucinations on/off voices just conversating Ideas of Reference:Paranoia  Suicidal Thoughts:Suicidal Thoughts: Yes, Passive SI Passive Intent and/or Plan: Without Intent (Chronic history of reporting suicidal ideation if he doesn't get housing.) Homicidal Thoughts:No data recorded  Sensorium  Memory:Immediate Good; Recent Good  Judgment:Fair  Insight:Fair; Present   Executive Functions  Concentration:Good  Attention Span:Good  [place] of Knowledge:Good  Language:Good   Psychomotor Activity  Psychomotor Activity: Psychomotor Activity: Normal  Assets  Assets:Desire for Improvement; Resilience   Sleep  Sleep: Sleep:  Good   Physical Exam: Physical Exam Vitals and nursing note reviewed. Exam conducted with a chaperone present.  Constitutional:      General: He is not in acute distress.    Appearance: Normal appearance. He is not ill-appearing.  Cardiovascular:     Rate and Rhythm: Normal rate.     Comments: Elevated blood pressure  Pulmonary:     Effort: Pulmonary effort is normal.  Neurological:     Mental Status: He is alert and oriented to person, place, and time.  Psychiatric:        Attention and Perception: Attention and perception normal. He does not perceive visual hallucinations. Auditory hallucinations: Reporting auditory hallucinations; but doesn't appear to be responding to stimuli.       Mood and Affect: Mood and affect normal.        Speech: Speech normal.        Behavior: Behavior normal. Behavior is cooperative.        Thought Content: Thought content is not paranoid or delusional. Thought content does not include homicidal ideation. Suicidal: Patient reporting suicidal ideation related to homelessness and tired of being in the street.       Cognition and Memory: Cognition and memory normal.        Judgment: Judgment is impulsive.   Review of Systems  Constitutional: Negative.        COVID + asymptomatic  HENT: Negative.    Eyes: Negative.   Respiratory: Negative.    Cardiovascular: Negative.   Gastrointestinal: Negative.   Genitourinary: Negative.   Musculoskeletal: Negative.   Skin: Negative.   Neurological: Negative.   Endo/Heme/Allergies: Negative.   Psychiatric/Behavioral:  Positive for depression. Negative for memory loss. Hallucinations: State he is hearing voices that talk to him.. Suicidal ideas: Stating he is tired of being homeless and suidal related to homelessness.The patient is not nervous/anxious and does not have insomnia.   Blood pressure (!) 142/92, pulse 75, temperature 99.2 F (37.3 C), temperature source Oral, resp. rate 15, height 5' 9"  (1.753 m),  weight 68.9 kg, SpO2 100 %. Body mass index is 22.45 kg/m.  Assessment:  Patient has had several urgent care and ED visits with similar complaints.  Patient has history of   Patient reporting his main stressor is being homeless and tired of being on the street.   Patient has chronic history of making suicidal statements at discharge and parasuicidal behaviors (acting like he is going to commit suicide or gesture In front of someone when he is not) Has been noted to be expression of patient's unmet needs (housing, pain management, transportation, etc.).  Which demonstrates patients' maladaptive coping skills rather than an indication of patient imminent risk of death.  There is no evidence to indicate that patient has made a serious suicide attempt despite his frequent visits to urgent care and ED which are usually centered on request for controlled substances or housing; Although this doesn't rule out possible future attempts, especially to  escalated "proof of serious intent.  Patient does have coping skills that has allowed exploration of other avenues of getting his needs met.   Evidence indicates that subsequent suicide attempts by patients who made contingent suicide threats, defined as threatened suicide, or exaggerated suicidality, are uncommon in both groups. Hospitalization should not be used as a substitute for social services, substance abuse treatment, and legal assistance for patients who make contingent suicide threats (Characteristics and six-month outcome of patients who use suicide threats to seek hospital admission. (1996). Psychiatric Services, 47(8), [phone]. Doi:10.1176/ps.47.8.871).  As noted, patients' behavior has been documented by multiple assessments of prior evaluators of patient noting patient directly attributes homelessness to his suicidal ideation "tired of being on the street."  "I need a place to stay." "I'm going to jump off bridge if I'm discharged back to street and no  place to stay."  Patient has requested that he needs to be in the hospital for a couple of days to get back on medications for his mental status.  Patient is only able to contract for safety if he has a place to stay.  Patient has admitted in the past of faking suicidal ideation in urgent care and ED setting as noted above for secondary gain.  Although future safety can't be predicted.  It doesn't appear that patient would benefit from psychiatric hospitalization at this time.     Treatment Plan Summary: Plan Social work/TOC consult to assist patient with resources for outpatient psychiatric services, housing, substance rehab facilities.  Patient also COVID + and homeless, place to stay for (isolation period).    Disposition: No evidence of imminent risk to self or others at present.   Patient does not meet criteria for psychiatric inpatient admission. Supportive therapy provided about ongoing stressors. Refer to IOP. Discussed crisis plan, support from social network, calling 911, coming to the Emergency Department, and calling Suicide Hotline.  This service was provided via telemedicine using a 2-way, interactive audio and video technology.  Names of all persons participating in this telemedicine service and their role in this encounter. Name: Earleen Newport Role: NP  Name: Dr. Hampton Abbot Role: Psychiatrist  Name: Staci Righter Role: Patient  Name: Duanne Guess, Rn Role: Patients nurse sent a secure message informing:  Psychiatric consult complete.  Patient psychiatrically cleared.  Social work consult order to assist patient wit psychiatric outpatient follow-up, housing resources, transportation, and patient COVID +, resources for isolation.  Please inform MD only default listed.        Marsela Kuan, NP 09/07/2020 1:33 PM

## 2020-09-07 NOTE — ED Provider Notes (Signed)
Emergency Medicine Observation Re-evaluation Note  Jerry [name] is a 45 y.o. male, seen on rounds today.  Pt initially presented to the ED for complaints of Psychiatric Evaluation Currently, the patient is in bed.  COVID positive.  No symptoms.  Physical Exam  BP 135/61 (BP Location: Right Arm)   Pulse 94   Temp 98.3 F (36.8 C) (Oral)   Resp 18   Ht 5\' 9"  (1.753 m)   Wt 68.9 kg   SpO2 97%   BMI 22.45 kg/m  Physical Exam General: No acute distress Cardiac: Well-perfused Lungs: Nonlabored Psych: Cooperative  ED Course / MDM  EKG:   I have reviewed the labs performed to date as well as medications administered while in observation.  Recent changes in the last 24 hours include psychiatric evaluation.  Plan  Current plan is for reassessment by psychiatry. Patient is not under full IVC at this time.  1410-psychiatry has cleared the patient.  They are putting a social work consult for outpatient resources.  1440 -transitions of care notes that the patient is COVID-positive and unable to go to a shelter or participation in outpatient services at this time.  She has attached shelter and outpatient substance counseling resources to the discharge paperwork.   , MD 09/07/20 1728

## 2020-09-07 NOTE — ED Notes (Signed)
Breakfast Orders placed 

## 2020-09-07 NOTE — Progress Notes (Signed)
Patient is covid positive and will not be able to go to a shelter or participate in any outpatient services at this time. CSW attached shelter and outpatient substance/counseling resources to AVS.

## 2020-09-07 NOTE — Consult Note (Signed)
  1700 a secure message sent to D[facility] nurse Daneen Schick informing:  Would like to do telepsych on this patient.  Please message me when machine is in the room.

## 2020-10-25 ENCOUNTER — Other Ambulatory Visit: Payer: Self-pay

## 2020-10-25 ENCOUNTER — Encounter (HOSPITAL_COMMUNITY): Payer: Self-pay | Admitting: Emergency Medicine

## 2020-10-25 ENCOUNTER — Emergency Department (HOSPITAL_COMMUNITY)
Admission: EM | Admit: 2020-10-25 | Discharge: 2020-10-27 | Disposition: A | Discharge status: Home / Self Care | Payer: Self-pay | Attending: Student | Admitting: Student

## 2020-10-25 DIAGNOSIS — R109 Unspecified abdominal pain: Secondary | ICD-10-CM | POA: Insufficient documentation

## 2020-10-25 DIAGNOSIS — R45851 Suicidal ideations: Secondary | ICD-10-CM | POA: Insufficient documentation

## 2020-10-25 DIAGNOSIS — Z20822 Contact with and (suspected) exposure to covid-19: Secondary | ICD-10-CM | POA: Insufficient documentation

## 2020-10-25 DIAGNOSIS — F17210 Nicotine dependence, cigarettes, uncomplicated: Secondary | ICD-10-CM | POA: Insufficient documentation

## 2020-10-25 DIAGNOSIS — F1010 Alcohol abuse, uncomplicated: Secondary | ICD-10-CM | POA: Diagnosis present

## 2020-10-25 DIAGNOSIS — F10288 Alcohol dependence with other alcohol-induced disorder: Secondary | ICD-10-CM | POA: Insufficient documentation

## 2020-10-25 DIAGNOSIS — F259 Schizoaffective disorder, unspecified: Secondary | ICD-10-CM | POA: Insufficient documentation

## 2020-10-25 DIAGNOSIS — Z5900 Homelessness unspecified: Secondary | ICD-10-CM

## 2020-10-25 DIAGNOSIS — F1994 Other psychoactive substance use, unspecified with psychoactive substance-induced mood disorder: Secondary | ICD-10-CM | POA: Diagnosis present

## 2020-10-25 DIAGNOSIS — Z765 Malingerer [conscious simulation]: Secondary | ICD-10-CM

## 2020-10-25 DIAGNOSIS — F1910 Other psychoactive substance abuse, uncomplicated: Secondary | ICD-10-CM | POA: Diagnosis present

## 2020-10-25 DIAGNOSIS — R440 Auditory hallucinations: Secondary | ICD-10-CM

## 2020-10-25 DIAGNOSIS — J189 Pneumonia, unspecified organism: Secondary | ICD-10-CM

## 2020-10-25 DIAGNOSIS — I10 Essential (primary) hypertension: Secondary | ICD-10-CM | POA: Insufficient documentation

## 2020-10-25 DIAGNOSIS — Y900 Blood alcohol level of less than 20 mg/100 ml: Secondary | ICD-10-CM | POA: Insufficient documentation

## 2020-10-25 LAB — RAPID URINE DRUG SCREEN, HOSP PERFORMED
Amphetamines: POSITIVE — AB
Barbiturates: NOT DETECTED
Benzodiazepines: POSITIVE — AB
Cocaine: POSITIVE — AB
Opiates: NOT DETECTED
Tetrahydrocannabinol: POSITIVE — AB

## 2020-10-25 LAB — SALICYLATE LEVEL: Salicylate Lvl: <7 mg/dL — ABNORMAL LOW (ref 7.0–30.0)

## 2020-10-25 LAB — COMPREHENSIVE METABOLIC PANEL
ALT: 55 U/L — ABNORMAL HIGH (ref 0–44)
AST: 74 U/L — ABNORMAL HIGH (ref 15–41)
Albumin: 3.5 g/dL (ref 3.5–5.0)
Alkaline Phosphatase: 78 U/L (ref 38–126)
Anion gap: 10 (ref 5–15)
BUN: 6 mg/dL (ref 6–20)
CO2: 22 mmol/L (ref 22–32)
Calcium: 9.2 mg/dL (ref 8.9–10.3)
Chloride: 97 mmol/L — ABNORMAL LOW (ref 98–111)
Creatinine, Ser: 1.02 mg/dL (ref 0.61–1.24)
GFR, Estimated: >60 mL/min (ref >60)
Glucose, Bld: 133 mg/dL — ABNORMAL HIGH (ref 70–99)
Potassium: 3.7 mmol/L (ref 3.5–5.1)
Sodium: 129 mmol/L — ABNORMAL LOW (ref 135–145)
Total Bilirubin: 0.7 mg/dL (ref 0.3–1.2)
Total Protein: 9.5 g/dL — ABNORMAL HIGH (ref 6.5–8.1)

## 2020-10-25 LAB — CBC
HCT: 40.5 % (ref 39.0–52.0)
Hemoglobin: 13.6 g/dL (ref 13.0–17.0)
MCH: 32.7 pg (ref 26.0–34.0)
MCHC: 33.6 g/dL (ref 30.0–36.0)
MCV: 97.4 fL (ref 80.0–100.0)
Platelets: 270 10*3/uL (ref 150–400)
RBC: 4.16 MIL/uL — ABNORMAL LOW (ref 4.22–5.81)
RDW: 12.7 % (ref 11.5–15.5)
WBC: 6.9 10*3/uL (ref 4.0–10.5)
nRBC: 0 % (ref 0.0–0.2)

## 2020-10-25 LAB — ACETAMINOPHEN LEVEL: Acetaminophen (Tylenol), Serum: <10 ug/mL — ABNORMAL LOW (ref 10–30)

## 2020-10-25 LAB — ETHANOL: Alcohol, Ethyl (B): 18 mg/dL — ABNORMAL HIGH (ref <10)

## 2020-10-25 LAB — SARS CORONAVIRUS 2 (TAT 6-24 HRS): SARS Coronavirus 2: NEGATIVE

## 2020-10-25 MED ORDER — HALOPERIDOL 5 MG PO TABS: 5.0000 mg | ORAL_TABLET | Freq: Once | ORAL | Status: AC | PRN

## 2020-10-25 MED ORDER — THIAMINE HCL 100 MG/ML IJ SOLN: 100.0000 mg | Freq: Every day | INTRAMUSCULAR | Status: DC

## 2020-10-25 MED ORDER — LORAZEPAM 1 MG PO TABS: 0.0000 mg | ORAL_TABLET | Freq: Two times a day (BID) | ORAL | Status: DC

## 2020-10-25 MED ORDER — LORAZEPAM 2 MG/ML IJ SOLN: 0.0000 mg | Freq: Two times a day (BID) | INTRAMUSCULAR | Status: DC

## 2020-10-25 MED ORDER — SODIUM CHLORIDE 0.9 % IV SOLN
1.0000 mg | Freq: Once | INTRAVENOUS | Status: DC
  Filled 2020-10-25: qty 0.2

## 2020-10-25 MED ORDER — NICOTINE 21 MG/24HR TD PT24
21.0000 mg | MEDICATED_PATCH | Freq: Every day | TRANSDERMAL | Status: DC
  Administered 2020-10-25 – 2020-10-26 (×2): 21 mg via TRANSDERMAL
  Filled 2020-10-25 (×2): qty 1

## 2020-10-25 MED ORDER — THIAMINE HCL 100 MG PO TABS
100.0000 mg | ORAL_TABLET | Freq: Every day | ORAL | Status: DC
  Administered 2020-10-25 – 2020-10-26 (×2): 100 mg via ORAL
  Filled 2020-10-25 (×2): qty 1

## 2020-10-25 MED ORDER — LORAZEPAM 1 MG PO TABS
0.0000 mg | ORAL_TABLET | Freq: Four times a day (QID) | ORAL | Status: AC
  Administered 2020-10-25 – 2020-10-26 (×4): 2 mg via ORAL
  Administered 2020-10-26 (×2): 1 mg via ORAL
  Filled 2020-10-25: qty 1
  Filled 2020-10-25 (×5): qty 2

## 2020-10-25 MED ORDER — LORAZEPAM 2 MG/ML IJ SOLN: 0.0000 mg | Freq: Four times a day (QID) | INTRAMUSCULAR | Status: AC

## 2020-10-25 MED ORDER — LORAZEPAM 2 MG/ML IJ SOLN
2.0000 mg | Freq: Once | INTRAMUSCULAR | Status: AC
  Administered 2020-10-25: 2 mg via INTRAMUSCULAR

## 2020-10-25 MED ORDER — HALOPERIDOL LACTATE 5 MG/ML IJ SOLN
5.0000 mg | Freq: Once | INTRAMUSCULAR | Status: AC | PRN
  Administered 2020-10-25: 5 mg via INTRAMUSCULAR
  Filled 2020-10-25: qty 1

## 2020-10-25 MED ORDER — LORAZEPAM 2 MG/ML IJ SOLN
2.0000 mg | Freq: Once | INTRAMUSCULAR | Status: DC
  Filled 2020-10-25: qty 1

## 2020-10-25 NOTE — ED Notes (Signed)
This RN spoke to staffing who reported that they do not have sitters right now

## 2020-10-25 NOTE — ED Notes (Signed)
Dr. Posey Rea at bedside speaking with patient.

## 2020-10-25 NOTE — ED Notes (Signed)
Placed Breakfast Order 

## 2020-10-25 NOTE — ED Notes (Signed)
Pt is very paranoid that someone here is going to kill him. Pt pointing to random people and saying that is the person who is going to kill me. Pt redirected and reassured that no one is going to harm him. Pt given ativan and drink. Pt sitting at edge of bed.

## 2020-10-25 NOTE — ED Notes (Signed)
Pt now resting at this time. NAD noted

## 2020-10-25 NOTE — ED Provider Notes (Addendum)
Physical Exam  BP (!) 149/99 (BP Location: Left Arm)   Pulse 93   Temp 98.5 F (36.9 C) (Oral)   Resp 18   SpO2 97%   Physical Exam Vitals and nursing note reviewed.  HENT:     Head: Normocephalic and atraumatic.  Eyes:     Conjunctiva/sclera: Conjunctivae normal.  Cardiovascular:     Rate and Rhythm: Normal rate and regular rhythm.  Pulmonary:     Effort: Pulmonary effort is normal. No respiratory distress.  Skin:    General: Skin is warm and dry.     Findings: No rash.  Neurological:     General: No focal deficit present.     Mental Status: He is alert.  Psychiatric:        Attention and Perception: He perceives auditory and visual hallucinations.        Thought Content: Thought content includes suicidal ideation.    ED Course/Procedures     Procedures  Results for orders placed or performed during the hospital encounter of 10/25/20  Comprehensive metabolic panel  Result Value Ref Range   Sodium 129 (L) 135 - 145 mmol/L   Potassium 3.7 3.5 - 5.1 mmol/L   Chloride 97 (L) 98 - 111 mmol/L   CO2 22 22 - 32 mmol/L   Glucose, Bld 133 (H) 70 - 99 mg/dL   BUN 6 6 - 20 mg/dL   Creatinine, Ser 1.69 0.61 - 1.24 mg/dL   Calcium 9.2 8.9 - 45.0 mg/dL   Total Protein 9.5 (H) 6.5 - 8.1 g/dL   Albumin 3.5 3.5 - 5.0 g/dL   AST 74 (H) 15 - 41 U/L   ALT 55 (H) 0 - 44 U/L   Alkaline Phosphatase 78 38 - 126 U/L   Total Bilirubin 0.7 0.3 - 1.2 mg/dL   GFR, Estimated >38 >88 mL/min   Anion gap 10 5 - 15  Ethanol  Result Value Ref Range   Alcohol, Ethyl (B) 18 (H) <10 mg/dL  Salicylate level  Result Value Ref Range   Salicylate Lvl <7.0 (L) 7.0 - 30.0 mg/dL  Acetaminophen level  Result Value Ref Range   Acetaminophen (Tylenol), Serum <10 (L) 10 - 30 ug/mL  cbc  Result Value Ref Range   WBC 6.9 4.0 - 10.5 K/uL   RBC 4.16 (L) 4.22 - 5.81 MIL/uL   Hemoglobin 13.6 13.0 - 17.0 g/dL   HCT 28.0 03.4 - 91.7 %   MCV 97.4 80.0 - 100.0 fL   MCH 32.7 26.0 - 34.0 pg   MCHC 33.6  30.0 - 36.0 g/dL   RDW 91.5 05.6 - 97.9 %   Platelets 270 150 - 400 K/uL   nRBC 0.0 0.0 - 0.2 %   No results found.   MDM    6:15 AM Patient signed out to me by previous physician. 45 year old male with past medical history of bipolar disorder, PTSD, and alcohol abuse presenting for auditory and visual hallucinations.  Pending psychiatric evaluation.  Patient tremulous on exam with concerns for alcohol withdrawal.  Ativan given by previous provider.  4:12 PM Ethanol level positive. Less concern for withdrawal. Folic acid and thiamine given. Patient medically cleared at this time. Patient evaluated by [facility] physician with recommendations for psychiatric admission for suicidal ideation and hallucinations. Patient has not taken any psych meds in over a year, will hold on prescribing old meds/doses until recommendations given by [facility] physician. Diet order placed.          Wallace Cullens,  Hessie Knows, DO 10/25/20 1614    Franne Forts, DO 10/25/20 1615

## 2020-10-25 NOTE — Progress Notes (Signed)
Patient meets inpatient criteria per  Assunta Found, NP.  [facility] reports no beds available at [facility].  Patient was referred to the following facilities:  Service Provider Request Status Selected Services Address Phone Fax Patient Preferred  [facility]  Pending - Request Sent N/A [address], [place] [address] [phone] --  [facility]  Pending - Request Sent N/A [address], [place] [address] [phone] --  [facility]  Pending - Request Sent N/A [address] [place], [place] [address] [phone] --  [facility]  Pending - Request Sent N/A [address] [place] [address] [phone] --  [facility]  Pending - Request Sent N/A [address] [place] [address] [phone] --  [facility]  Pending - Request Sent N/A [address] [place] [address] [phone] --  [facility]  Pending - Request Sent N/A [address] [place] [address] [phone] --  [facility]  Pending - Request Sent N/A [address] [place] [address] [phone] --  [facility]  Pending - Request Sent N/A [address] [place] [address] [phone] --  [facility]  Pending - Request Sent N/A [address] [place], [place] [address] [phone] --  [facility]  Pending - Request Sent N/A [address] [place] [address] [phone] --     CSW will continue to monitor for disposition.  Penni Homans, MSW, LCSW 10/25/2020 2:29 PM

## 2020-10-25 NOTE — ED Triage Notes (Signed)
Patient reports suicidal ideation , did not disclose his plan of suicide , patient added auditory and visual hallucinations.

## 2020-10-25 NOTE — ED Notes (Signed)
Pharmacist notified to switch folic acid IVP to PO. Pt has no IV.

## 2020-10-25 NOTE — ED Notes (Signed)
Pt is very paranoid at this time asking staff who is talking about him. Pt is guarded and appears to be responding to internal stimuli. Pt was observed peaking out of the room and was having some tremors. Pt reports, "I feel very nervous." Pt redirected multiple times. Pt is currently looking at the police officer sitting by the nursing station. Asked RN, "Is that a police officer sitting there?" Pt was told by RN that police officer is here to maintain safety and works for [employer] ED. Pt continues to look at the police officer. Safety precautions maintained. 1:1 with sitter continued. Pt redirected by staff multiple times.

## 2020-10-25 NOTE — ED Notes (Signed)
Pt is now calm and has been asleep. No agitation observed.

## 2020-10-25 NOTE — ED Provider Notes (Signed)
[facility] EMERGENCY DEPARTMENT Provider Note   CSN: [id number] Arrival date & time: 10/25/20  0310     History Chief Complaint  Patient presents with   Suicidal    Hallucinations    Jerry [name] is a 45 y.o. male with PMH bipolar 1 disorder, PTSD, alcohol abuse who presents to the emergency department for evaluation of visual and auditory hallucinations, paranoia and concern for alcohol withdrawal.  Initial history difficult to obtain as the patient is currently displaying signs of psychosis and tremulousness, currently react internal stimuli voicing concerns that patients in adjacent rooms are threatening to kill him.  He endorses a will to die but offers no plan at this time.  No access to firearms.  Denies homicidal ideation.  HPI     Past Medical History:  Diagnosis Date   Anxiety    Bipolar 1 disorder (HCC)    Depression    Hallucination    PTSD (post-traumatic stress disorder)     Patient Active Problem List   Diagnosis Date Noted   Malingering 09/07/2020   Homelessness 09/07/2020   Substance induced mood disorder (HCC) 09/07/2020   Major depressive disorder, recurrent severe without psychotic features (HCC) 07/17/2020   Schizophrenia spectrum disorder with psychotic disorder type not yet determined (HCC) 07/17/2020   Methamphetamine abuse (HCC) 07/17/2020   Marijuana abuse 07/17/2020   Cocaine abuse (HCC) 07/17/2020   Anxiety and depression 06/25/2014   Family history of diabetes mellitus (DM) 06/25/2014   Family history of thyroid disease 06/25/2014   Tobacco abuse 06/25/2014   Alcohol abuse 05/18/2013   Polysubstance abuse (HCC) 05/18/2013   Depression, unspecified 05/18/2013    Past Surgical History:  Procedure Laterality Date   KIDNEY DONATION Right 2003       Family History  Problem Relation Age of Onset   Heart disease Mother    Cancer Mother    Heart disease Father    Diabetes Father    Diabetes Maternal Grandmother     Stroke Paternal Grandfather     Social History   Tobacco Use   Smoking status: Every Day    Packs/day: 2.00    Types: Cigarettes   Smokeless tobacco: Never  Substance Use Topics   Alcohol use: Yes    Alcohol/week: 32.0 standard drinks    Types: 12 Cans of beer, 20 Shots of liquor per week    Comment:  last use was 1 month ago    Drug use: Yes    Types: Marijuana, Cocaine, Heroin, Amphetamines, Methamphetamines    Comment: history of IVDA    Home Medications Prior to Admission medications   Medication Sig Start Date End Date Taking? Authorizing Provider  ARIPiprazole (ABILIFY) 10 MG tablet Take 10 mg by mouth daily. Patient not taking: Reported on 10/25/2020    [provider]  gabapentin (NEURONTIN) 100 MG capsule Take 1 capsule (100 mg total) by mouth 3 (three) times daily. For agitation Patient not taking: Reported on 10/25/2020 07/21/20   Armandina Stammer I, NP  nicotine (NICODERM CQ - DOSED IN MG/24 HOURS) 21 mg/24hr patch Place 1 patch (21 mg total) onto the skin daily. For smoking cessation Patient not taking: Reported on 10/25/2020 07/22/20   Armandina Stammer I, NP  prazosin (MINIPRESS) 2 MG capsule Take 2 mg by mouth at bedtime. Patient not taking: Reported on 10/25/2020    [provider]  risperiDONE (RISPERDAL) 1 MG tablet Take 1 tablet (1 mg total) by mouth at bedtime. For mood  control Patient not taking: Reported on 10/25/2020 07/21/20   Armandina Stammer I, NP  traZODone (DESYREL) 50 MG tablet Take 1 tablet (50 mg total) by mouth at bedtime as needed for sleep. Patient not taking: Reported on 10/25/2020 07/21/20   Armandina Stammer I, NP    Allergies    Penicillins  Review of Systems   Review of Systems  Unable to perform ROS: Psychiatric disorder   Physical Exam Updated Vital Signs BP (!) 147/98 (BP Location: Right Arm)   Pulse (!) 59   Temp 97.9 F (36.6 C)   Resp 17   SpO2 96%   Physical Exam Vitals and nursing note reviewed.  Constitutional:      Appearance: He  is well-developed.  HENT:     Head: Normocephalic and atraumatic.  Eyes:     Conjunctiva/sclera: Conjunctivae normal.  Cardiovascular:     Rate and Rhythm: Normal rate and regular rhythm.     Heart sounds: No murmur heard. Pulmonary:     Effort: Pulmonary effort is normal. No respiratory distress.     Breath sounds: Normal breath sounds.  Abdominal:     Palpations: Abdomen is soft.     Tenderness: There is no abdominal tenderness.  Musculoskeletal:     Cervical back: Neck supple.  Skin:    General: Skin is warm and dry.  Neurological:     Mental Status: He is alert.    ED Results / Procedures / Treatments   Labs (all labs ordered are listed, but only abnormal results are displayed) Labs Reviewed  COMPREHENSIVE METABOLIC PANEL - Abnormal; Notable for the following components:      Result Value   Sodium 129 (*)    Chloride 97 (*)    Glucose, Bld 133 (*)    Total Protein 9.5 (*)    AST 74 (*)    ALT 55 (*)    All other components within normal limits  ETHANOL - Abnormal; Notable for the following components:   Alcohol, Ethyl (B) 18 (*)    All other components within normal limits  SALICYLATE LEVEL - Abnormal; Notable for the following components:   Salicylate Lvl <7.0 (*)    All other components within normal limits  ACETAMINOPHEN LEVEL - Abnormal; Notable for the following components:   Acetaminophen (Tylenol), Serum <10 (*)    All other components within normal limits  CBC - Abnormal; Notable for the following components:   RBC 4.16 (*)    All other components within normal limits  RAPID URINE DRUG SCREEN, HOSP PERFORMED    EKG None  Radiology No results found.  Procedures Procedures   Medications Ordered in ED Medications  LORazepam (ATIVAN) injection 0-4 mg (has no administration in time range)    Or  LORazepam (ATIVAN) tablet 0-4 mg (has no administration in time range)  LORazepam (ATIVAN) injection 0-4 mg (has no administration in time range)    Or   LORazepam (ATIVAN) tablet 0-4 mg (has no administration in time range)  thiamine tablet 100 mg (has no administration in time range)    Or  thiamine (B-1) injection 100 mg (has no administration in time range)  LORazepam (ATIVAN) injection 2 mg (2 mg Intramuscular Given 10/25/20 0449)    ED Course  I have reviewed the triage vital signs and the nursing notes.  Pertinent labs & imaging results that were available during my care of the patient were reviewed by me and considered in my medical decision making (see chart for details).  MDM Rules/Calculators/A&P                           Patient seen in the emergency department for evaluation of paranoid delusions, suicidal ideation and auditory visual hallucinations.  Physical exam is largely unremarkable outside of a tremulous agitated patient.  No external signs of trauma.  Laboratory evaluation reveals hyponatremia 129, hypochloremia to 97 and transaminitis with AST 74, ALT 55 that may be suggestive of impending beer [place]oto mania.  Alcohol level 18, aspirin and Tylenol levels unremarkable.  CBC unremarkable.  Patient given 2 mg IM Ativan to treat possible alcohol withdrawal.  On reevaluation, patient is more calm but still minimally tremulous.  He is mildly hypertensive at 147/98 but not tachycardic and I believe his presentation is likely a combination of both alcohol withdrawa and underlying psychiatric issues.  Psychiatry was consulted and their recommendations are currently pending.  CIWA protocol is in place and patient was signed out to oncoming provider.  Please see provider signout note for continuation of work-up. Final Clinical Impression(s) / ED Diagnoses Final diagnoses:  None    Rx / DC Orders ED Discharge Orders     None        Rachell Druckenmiller, Wyn Forster, MD 10/25/20 [phone]
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
## 2020-10-26 ENCOUNTER — Emergency Department (HOSPITAL_COMMUNITY): Payer: Self-pay

## 2020-10-26 ENCOUNTER — Encounter (HOSPITAL_COMMUNITY): Payer: Self-pay | Admitting: Registered Nurse

## 2020-10-26 DIAGNOSIS — R440 Auditory hallucinations: Secondary | ICD-10-CM | POA: Insufficient documentation

## 2020-10-26 DIAGNOSIS — Z765 Malingerer [conscious simulation]: Secondary | ICD-10-CM

## 2020-10-26 DIAGNOSIS — Z5900 Homelessness unspecified: Secondary | ICD-10-CM

## 2020-10-26 DIAGNOSIS — F1010 Alcohol abuse, uncomplicated: Secondary | ICD-10-CM

## 2020-10-26 DIAGNOSIS — F1910 Other psychoactive substance abuse, uncomplicated: Secondary | ICD-10-CM

## 2020-10-26 LAB — URINALYSIS, ROUTINE W REFLEX MICROSCOPIC
Bilirubin Urine: NEGATIVE
Glucose, UA: NEGATIVE mg/dL
Ketones, ur: NEGATIVE mg/dL
Leukocytes,Ua: NEGATIVE
Nitrite: NEGATIVE
Protein, ur: 100 mg/dL — AB
Specific Gravity, Urine: 1.017 (ref 1.005–1.030)
pH: 7 (ref 5.0–8.0)

## 2020-10-26 MED ORDER — SODIUM CHLORIDE 0.9 % IV BOLUS
1000.0000 mL | Freq: Once | INTRAVENOUS | Status: AC
  Administered 2020-10-26: 1000 mL via INTRAVENOUS

## 2020-10-26 MED ORDER — SODIUM CHLORIDE 0.9 % IV SOLN
500.0000 mg | Freq: Once | INTRAVENOUS | Status: AC
  Administered 2020-10-27: 500 mg via INTRAVENOUS
  Filled 2020-10-26: qty 500

## 2020-10-26 MED ORDER — ACETAMINOPHEN 325 MG PO TABS
650.0000 mg | ORAL_TABLET | Freq: Once | ORAL | Status: AC | PRN
  Administered 2020-10-26: 650 mg via ORAL
  Filled 2020-10-26: qty 2

## 2020-10-26 MED ORDER — SODIUM CHLORIDE 0.9 % IV SOLN
1.0000 g | Freq: Once | INTRAVENOUS | Status: AC
  Administered 2020-10-26: 1 g via INTRAVENOUS
  Filled 2020-10-26: qty 1

## 2020-10-26 NOTE — Consult Note (Signed)
Telepsych Consultation   Secure message sent to patient's nurse Mariane Duval at 10:03 AM informing: Want to set up tele psych for this patient.  Would also like to see rooms 50 and 52.  Please message me when machine is in room  Reason for Consult:  Suicidal ideation Referring Physician:  Glendora Score, MD Location of Patient: [facility] ED Location of Provider: Other: [facility]  Patient Identification: JEROMEY KRUER MRN:  [id number] Principal Diagnosis: Malingering Diagnosis:  Principal Problem:   Malingering Active Problems:   Alcohol abuse   Polysubstance abuse (HCC)   Homelessness   Substance induced mood disorder (HCC)   Total Time spent with patient: 30 minutes  Subjective:   Jerry [name] is a 45 y.o. male patient admitted to 44 yr. male with a psychiatric history significant for malingering, polysubstance use, reported schizophrenia.  Patient admitted to [facility] ED after presenting with complaints of suicidal ideation with no specific plan and auditory hallucinations.    HPI:  Jerry [name], 45 y.o., male patient seen via tele health by this provider, consulted with Dr. Earlene Plater; and chart reviewed on 10/26/20.  On evaluation Jerry [name] reports he is paranoid, hearing voices and wants to step In front of a car.  Patient reporting his stressor as being his mental health, hallucinations, and wanting detox and rehab.  Patient will not elaborate on what voices are saying or what he is seeing.  Patient states he is homeless.  Patient denies outpatient psychiatric services at this time. Patient asked why he hadn't followed up at any of the resources for outpatient psychiatric services and he states, "because I don't have any way to get there."  Patient asked how he was getting around to all of the different emergency departments and he states, "They give me a free bus ticket or PARK bust ticket." This patient has had multiple ED, Urgent care visits, and psychiatric hospitalization.   Patient does not follow up with any resources given for community services, substance use service, or psychiatric services.  Per chart review patient has traveled from ED to ED [facility], [place], and [facility] with same complaints of suicidal ideation either a plan to walk in to traffic or no specific plan, also auditory hallucinations, and paranoia.  Patient is currently homeless and has even complained that he needed a place to stay.  Patient informed that unable to refer to rehab/detox related to not taking patients that are suicidal because unable to watch 1:1.  During evaluation Jerry [name] is  sitting up in bed in no acute distress.  He is alert, oriented x 4, calm and cooperative.  His mood is dysphoric with congruent affect.  He does not appear to be responding to internal/external stimuli or delusional thoughts.  Patient denies homicidal ideation, psychosis, and paranoia.  Patient continue to endorse passive suicidal ideation requesting mental health help and rehab services.    Patient has been observed in the emergency department. Patient ongoing endorsement of suicidal ideation shows clear evidence of secondary gain of unmet needs/housing that is representative of limited and often-maladaptive coping and skills, rather than an indicator of imminent risk of death. Evidence indicates that subsequent suicide attempts by patients who made contingent suicide threats (defined as threatened suicide or exaggerated suicidality) are uncommon in both groups. Hospitalization should not be used as a substitute for social services, substance abuse treatment, and legal assistance for patients who make contingent suicide threats (Characteristics and six-month outcome of patients  who use suicide threats to seek hospital admission. (1996). Psychiatric Services, 47(8), [phone]. Doi:10.1176/ps.47.8.871)  Patient does not meet criteria for inpatient psychiatric care or further psychiatric evaluation at this  time-thank you for the consult. Patient was educated on outpatient walk-in resources such as [facility] or [place], [facility] and other outpatient psychiatric service, substance use services and community services.    Past Psychiatric History: Polysubstance abuse  Risk to Self:  No Risk to Others:  No Prior Inpatient Therapy:  Yes Prior Outpatient Therapy:  Yes  Past Medical History:  Past Medical History:  Diagnosis Date   Anxiety    Bipolar 1 disorder (HCC)    Depression    Hallucination    PTSD (post-traumatic stress disorder)     Past Surgical History:  Procedure Laterality Date   KIDNEY DONATION Right 2003   Family History:  Family History  Problem Relation Age of Onset   Heart disease Mother    Cancer Mother    Heart disease Father    Diabetes Father    Diabetes Maternal Grandmother    Stroke Paternal Grandfather    Family Psychiatric  History: See above Social History:  Social History   Substance and Sexual Activity  Alcohol Use Yes   Alcohol/week: 32.0 standard drinks   Types: 12 Cans of beer, 20 Shots of liquor per week   Comment:  last use was 1 month ago      Social History   Substance and Sexual Activity  Drug Use Yes   Types: Marijuana, Cocaine, Heroin, Amphetamines, Methamphetamines   Comment: history of IVDA    Social History   Socioeconomic History   Marital status: Married    Spouse name: Not on file   Number of children: Not on file   Years of education: Not on file   Highest education level: Not on file  Occupational History   Not on file  Tobacco Use   Smoking status: Every Day    Packs/day: 2.00    Types: Cigarettes   Smokeless tobacco: Never  Substance and Sexual Activity   Alcohol use: Yes    Alcohol/week: 32.0 standard drinks    Types: 12 Cans of beer, 20 Shots of liquor per week    Comment:  last use was 1 month ago    Drug use: Yes    Types: Marijuana, Cocaine, Heroin, Amphetamines, Methamphetamines    Comment: history of IVDA    Sexual activity: Yes  Other Topics Concern   Not on file  Social History Narrative   Not on file   Social Determinants of Health   Financial Resource Strain: Not on file  Food Insecurity: Not on file  Transportation Needs: Not on file  Physical Activity: Not on file  Stress: Not on file  Social Connections: Not on file   Additional Social History:    Allergies:   Allergies  Allergen Reactions   Penicillins Other (See Comments)    Reaction occurred in childhood    Labs:  Results for orders placed or performed during the hospital encounter of 10/25/20 (from the past 48 hour(s))  Comprehensive metabolic panel     Status: Abnormal   Collection Time: 10/25/20  3:28 AM  Result Value Ref Range   Sodium 129 (L) 135 - 145 mmol/L   Potassium 3.7 3.5 - 5.1 mmol/L   Chloride 97 (L) 98 - 111 mmol/L   CO2 22 22 - 32 mmol/L   Glucose, Bld 133 (H) 70 - 99 mg/dL  Comment: Glucose reference range applies only to samples taken after fasting for at least 8 hours.   BUN 6 6 - 20 mg/dL   Creatinine, Ser 1.27 0.61 - 1.24 mg/dL   Calcium 9.2 8.9 - 51.7 mg/dL   Total Protein 9.5 (H) 6.5 - 8.1 g/dL   Albumin 3.5 3.5 - 5.0 g/dL   AST 74 (H) 15 - 41 U/L   ALT 55 (H) 0 - 44 U/L   Alkaline Phosphatase 78 38 - 126 U/L   Total Bilirubin 0.7 0.3 - 1.2 mg/dL   GFR, Estimated >00 >17 mL/min    Comment: (NOTE) Calculated using the CKD-EPI Creatinine Equation (2021)    Anion gap 10 5 - 15    Comment: Performed at [facility], [address] [place], [place] [address]  Ethanol     Status: Abnormal   Collection Time: 10/25/20  3:28 AM  Result Value Ref Range   Alcohol, Ethyl (B) 18 (H) <10 mg/dL    Comment: (NOTE) Lowest detectable limit for serum alcohol is 10 mg/dL.  For medical purposes only. Performed at [facility], [address] [place], [place] [address]   Salicylate level     Status: Abnormal   Collection Time: 10/25/20  3:28 AM  Result Value Ref Range    Salicylate Lvl <7.0 (L) 7.0 - 30.0 mg/dL    Comment: Performed at [facility], [address] [place], [place] [address]  Acetaminophen level     Status: Abnormal   Collection Time: 10/25/20  3:28 AM  Result Value Ref Range   Acetaminophen (Tylenol), Serum <10 (L) 10 - 30 ug/mL    Comment: (NOTE) Therapeutic concentrations vary significantly. A range of 10-30 ug/mL  may be an effective concentration for many patients. However, some  are best treated at concentrations outside of this range. Acetaminophen concentrations >150 ug/mL at 4 hours after ingestion  and >50 ug/mL at 12 hours after ingestion are often associated with  toxic reactions.  Performed at [facility], [address] [place], [place] [address]   cbc     Status: Abnormal   Collection Time: 10/25/20  3:28 AM  Result Value Ref Range   WBC 6.9 4.0 - 10.5 K/uL   RBC 4.16 (L) 4.22 - 5.81 MIL/uL   Hemoglobin 13.6 13.0 - 17.0 g/dL   HCT 57.0 17.7 - 93.9 %   MCV 97.4 80.0 - 100.0 fL   MCH 32.7 26.0 - 34.0 pg   MCHC 33.6 30.0 - 36.0 g/dL   RDW 03.0 09.2 - 33.0 %   Platelets 270 150 - 400 K/uL   nRBC 0.0 0.0 - 0.2 %    Comment: Performed at [facility], [address], [place], [place] [address]  SARS CORONAVIRUS 2 (TAT 6-24 HRS) Nasopharyngeal Nasopharyngeal Swab     Status: None   Collection Time: 10/25/20  5:10 PM   Specimen: Nasopharyngeal Swab  Result Value Ref Range   SARS Coronavirus 2 NEGATIVE NEGATIVE    Comment: (NOTE) SARS-CoV-2 target nucleic acids are NOT DETECTED.  The SARS-CoV-2 RNA is generally detectable in upper and lower respiratory specimens during the acute phase of infection. Negative results do not preclude SARS-CoV-2 infection, do not rule out co-infections with other pathogens, and should not be used as the sole basis for treatment or other patient management decisions. Negative results must be combined with clinical observations, patient history, and epidemiological  information. The expected result  is Negative.  Fact Sheet for Patients: HairSlick.nohttps://www.fda.gov/media/138098/download  Fact Sheet for Healthcare Providers: quierodirigir.comhttps://www.fda.gov/media/138095/download  This test is not yet approved or cleared by the [place] FDA and  has been authorized for detection and/or diagnosis of SARS-CoV-2 by FDA under an Emergency Use Authorization (EUA). This EUA will remain  in effect (meaning this test can be used) for the duration of the COVID-19 declaration under Se ction 564(b)(1) of the Act, 21 U.S.C. section 360bbb-3(b)(1), unless the authorization is terminated or revoked sooner.  Performed at [facility], [address] [place], [place] [address]   Rapid urine drug screen (hospital performed)     Status: Abnormal   Collection Time: 10/25/20  6:00 PM  Result Value Ref Range   Opiates NONE DETECTED NONE DETECTED   Cocaine POSITIVE (A) NONE DETECTED   Benzodiazepines POSITIVE (A) NONE DETECTED   Amphetamines POSITIVE (A) NONE DETECTED   Tetrahydrocannabinol POSITIVE (A) NONE DETECTED   Barbiturates NONE DETECTED NONE DETECTED    Comment: (NOTE) DRUG SCREEN FOR MEDICAL PURPOSES ONLY.  IF CONFIRMATION IS NEEDED FOR ANY PURPOSE, NOTIFY LAB WITHIN 5 DAYS.  LOWEST DETECTABLE LIMITS FOR URINE DRUG SCREEN Drug Class                     Cutoff (ng/mL) Amphetamine and metabolites    1000 Barbiturate and metabolites    200 Benzodiazepine                 200 Tricyclics and metabolites     300 Opiates and metabolites        300 Cocaine and metabolites        300 THC                            50 Performed at [facility], [address] [place], [place] [address]     Medications:  Current Facility-Administered Medications  Medication Dose Route Frequency Provider Last Rate Last Admin   folic acid 1 mg in sodium chloride 0.9 % 50 mL IVPB  1 mg Intravenous Once Edwin DadaGray, Alicia P, DO       LORazepam (ATIVAN) injection 0-4 mg  0-4 mg  Intravenous Q6H Kommor, Madison, MD       Or   LORazepam (ATIVAN) tablet 0-4 mg  0-4 mg Oral Q6H Kommor, Madison, MD   2 mg at 10/26/20 1036   [START ON 10/27/2020] LORazepam (ATIVAN) injection 0-4 mg  0-4 mg Intravenous Q12H Kommor, Madison, MD       Or   Melene Muller[START ON 10/27/2020] LORazepam (ATIVAN) tablet 0-4 mg  0-4 mg Oral Q12H Kommor, Madison, MD       nicotine (NICODERM CQ - dosed in mg/24 hours) patch 21 mg  21 mg Transdermal Daily Edwin DadaGray, Alicia P, DO   21 mg at 10/26/20 1037   thiamine tablet 100 mg  100 mg Oral Daily Kommor, Madison, MD   100 mg at 10/26/20 1037   Or   thiamine (B-1) injection 100 mg  100 mg Intravenous Daily Kommor, Madison, MD       Current Outpatient Medications  Medication Sig Dispense Refill   ARIPiprazole (ABILIFY) 10 MG tablet Take 10 mg by mouth daily. (Patient not taking: Reported on 10/25/2020)     gabapentin (NEURONTIN) 100 MG capsule Take 1 capsule (100 mg total) by mouth 3 (three) times daily. For agitation (Patient not taking: Reported on 10/25/2020) 90 capsule  0   nicotine (NICODERM CQ - DOSED IN MG/24 HOURS) 21 mg/24hr patch Place 1 patch (21 mg total) onto the skin daily. For smoking cessation (Patient not taking: Reported on 10/25/2020) 1 patch 0   prazosin (MINIPRESS) 2 MG capsule Take 2 mg by mouth at bedtime. (Patient not taking: Reported on 10/25/2020)     risperiDONE (RISPERDAL) 1 MG tablet Take 1 tablet (1 mg total) by mouth at bedtime. For mood control (Patient not taking: Reported on 10/25/2020) 30 tablet 0   traZODone (DESYREL) 50 MG tablet Take 1 tablet (50 mg total) by mouth at bedtime as needed for sleep. (Patient not taking: Reported on 10/25/2020) 30 tablet 0    Musculoskeletal: Strength & Muscle Tone: within normal limits Gait & Station: normal Patient leans: N/A Psychiatric Specialty Exam:  Presentation  General Appearance: Appropriate for Environment  Eye Contact:Good  Speech:Clear and Coherent; Normal Rate  Speech  Volume:Normal  Handedness:Right   Mood and Affect  Mood:Dysphoric  Affect:Congruent   Thought Process  Thought Processes:Coherent; Goal Directed  Descriptions of Associations:Intact  Orientation:Full (Time, Place and Person)  Thought Content:WDL  History of Schizophrenia/Schizoaffective disorder:Yes  Duration of Psychotic Symptoms:Greater than six months  Hallucinations:No data recorded Ideas of Reference:Paranoia  Suicidal Thoughts:No data recorded Homicidal Thoughts:No data recorded  Sensorium  Memory:Immediate Good; Recent Good  Judgment:Fair  Insight:Fair; Present   Executive Functions  Concentration:Good  Attention Span:Good  Recall:Good  Fund of Knowledge:Good  Language:Good   Psychomotor Activity  Psychomotor Activity: No data recorded  Assets  Assets:Desire for Improvement; Resilience   Sleep  Sleep: No data recorded   Physical Exam: Physical Exam Vitals and nursing note reviewed. Exam conducted with a chaperone present.  Constitutional:      General: He is not in acute distress.    Appearance: Normal appearance. He is not ill-appearing.  Cardiovascular:     Rate and Rhythm: Normal rate.  Pulmonary:     Effort: Pulmonary effort is normal.  Neurological:     Mental Status: He is alert and oriented to person, place, and time.  Psychiatric:        Attention and Perception: Attention and perception normal.        Mood and Affect: Mood and affect normal.        Speech: Speech normal.        Behavior: Behavior normal. Behavior is cooperative.        Thought Content: Thought content is not paranoid or delusional. Thought content does not include homicidal ideation. Suicidal: Reporting suicidal ideation with no plan or intent.       Cognition and Memory: Cognition and memory normal.        Judgment: Judgment is impulsive.   Review of Systems  HENT: Negative.    Eyes: Negative.   Respiratory: Negative.    Cardiovascular: Negative.    Gastrointestinal: Negative.   Genitourinary: Negative.   Musculoskeletal: Negative.   Skin: Negative.   Neurological: Negative.   Endo/Heme/Allergies: Negative.   Psychiatric/Behavioral:  Positive for depression, hallucinations, substance abuse and suicidal ideas. The patient is nervous/anxious and has insomnia.        Patient intoxicated; patient able to contract for safety if he is able to be sent to detox, rehab, or "psych ward"  Patient reports he gets bus ticket from one ED to get to the next town for another ED.   Blood pressure (!) 139/91, pulse (!) 105, temperature 98.4 F (36.9 C), temperature source Oral, resp. rate 18, SpO2  93 %. There is no height or weight on file to calculate BMI.  Treatment Plan Summary: Plan Psychiatrically clear to follow up with resources given  Disposition: No evidence of imminent risk to self or others at present.   Patient does not meet criteria for psychiatric inpatient admission. Supportive therapy provided about ongoing stressors. Refer to IOP. Discussed crisis plan, support from social network, calling 911, coming to the Emergency Department, and calling Suicide Hotline.  This service was provided via telemedicine using a 2-way, interactive audio and video technology.  Names of all persons participating in this telemedicine service and their role in this encounter. Name: Assunta Found Role: NP  Name: Dr. Earlene Plater Role: Psychiatrist  Name: Jerry [name] Role: Patient  Name: Mariane Duval, RN Role: Patient's nurse sent a secure message informing:  Psychiatric consult complete.  Patient psychiatrically cleared to follow up with resources given for outpatient psychiatric services, substance use services, and community services.  Please inform MD only default listed     Hayzel Ruberg, NP 10/26/2020 4:25 PM

## 2020-10-26 NOTE — Progress Notes (Signed)
Patient information has been sent to [facility] via secure chat to review for potential admission. Patient meets inpatient criteria per Assunta Found, NP .   Situation ongoing, CSW will continue to monitor progress.    Signed:  Damita Dunnings, MSW, LCSW-A  10/26/2020 8:39 AM

## 2020-10-26 NOTE — ED Notes (Signed)
ED Provider at bedside. 

## 2020-10-26 NOTE — ED Notes (Signed)
Breakfast Orders Placed °

## 2020-10-26 NOTE — ED Provider Notes (Addendum)
Emergency Medicine Observation Re-evaluation Note  Jerry [name] is a 45 y.o. male, seen on rounds today.  Pt initially presented to the ED for complaints of Suicidal (Hallucinations) Currently, the patient is seen and evaluated for delusions, and hallucinations. .He has some left flank pain and some cough.  Physical Exam  BP 112/71 (BP Location: Left Arm)   Pulse (!) 106   Temp 100.1 F (37.8 C) (Oral)   Resp 20   SpO2 96%  Physical Exam General: patient mild fever and mild tachycardia, sats normal Cardiac: mild tachycardia Lungs: cta Psych: awake and alert  ED Course / MDM  EKG:   I have reviewed the labs performed to date as well as medications administered while in observation.  Recent changes in the last 24 hours include patient seen and cleared by psychiatry Labs with mild hyponatremia c.w. etoh use, and elevated transaminitis Covid had covid in June and is now covid negative   Plan  Current plan is for CXR, urinalysis, and ct abd and pelvis  [facility] SCRIMA is not under involuntary commitment.  CXR with right side pneumonia Urine with rbc, and few wbcs.  CT for stone pending with patient with left flank pain Rocephin and zithromax ordered   Margarita Grizzle, MD 10/26/20 2259    Margarita Grizzle, MD 10/26/20 2330

## 2020-10-26 NOTE — Progress Notes (Signed)
Patient has been denied by [facility] due to COVID restrictions within the facility. Patient meets inpatient criteria per [facility] Rankin,NP. Patient referred to the following facilities:  [facility]  [address] [place] [address] [phone]  [facility]  [address], [place] [address] [phone]  [facility]  [address] [place], [place] [address] [phone]  [facility]  [address] [place] [address] [phone]  [facility]  [address] [place] [address] [phone]  [facility]  [address] [place] [address] [phone]  [facility]  [address], [place] [address] [phone]  [facility]  [address], [place] [address] [phone]  [facility]  [address], [place] [address] [phone]  [facility]  [address] [place], [place] [address] [phone]  [facility]  [address] [place] [address] [phone]  [facility]  [address] [place] [address] [phone]  [facility]  [address] [place], [place] [address] [phone]  [facility]  [address] [place] [address] [phone]  [facility]  [address], [place] [address] [phone]  [facility]  [address], [place] [address] [phone]  [facility]  [address], [place] [address] [phone]  [facility]  [address], [place] [address] [phone]    CSW will  continue to monitor disposition.    Damita Dunnings, MSW, LCSW-A  1:08 PM 10/26/2020

## 2020-10-26 NOTE — ED Notes (Signed)
TTS in progress 

## 2020-10-26 NOTE — ED Notes (Signed)
Pt sleeping. No signs of withdrawal at this time.

## 2020-10-26 NOTE — BH Assessment (Signed)
Disposition:   Per Assunta Found, NP, patient is psychiatrically cleared to follow up with resources given for outpatient psychiatric services, substance use services, and community services. @2232 , patient's nurse , RN and EDP (Dr. Gayleen Orem) were provided disposition updates as noted in patient's chart.

## 2020-10-26 NOTE — Discharge Instructions (Addendum)
To help you maintain a sober lifestyle, a substance use disorder treatment program may be beneficial to you.  Contact one of the following providers at your earliest opportunity to ask about enrolling their program:  RESIDENTIAL REHAB PROGRAMS - CHARITABLE:       [facility]      [address], [place] [address]      [phone]       [facility]      [phone] [address], [place] [address]      [phone] II      [address]      [place], [place] [address]      [phone], [place] [address]      [phone]       [facility]      [address] [place], [place] [address]      [phone]  [facility] - MEDICAID/UNINSURED:       [facility]      [address] [place], [place] [address]      [phone]       [facility]      [address]      [place], [place] [address]      [phone]       [facility] residents only)      [address]      [place], [place] [address]      [phone]       [facility]Re[facility] for [facility] only)      [address]      [place], [place] [address]      [phone]  OUTPATIENT PROGRAMS:       [facility]      [address]      [place], [place] [address]      [phone]      Ask about their Substance Abuse Intensive Outpatient Program.  They also offer psychiatry/medication management and therapy.  New patients are seen in their walk-in clinic.  Walk-in hours are Monday - Thursday from 8:00 am - 11:00 am for psychiatry, and Friday from 1:00 pm - 4:00 pm for therapy.  Walk-in patients are seen on a first come, first served basis, so try to arrive as early as possible for the best chance of being seen the same day.

## 2020-10-27 ENCOUNTER — Emergency Department (HOSPITAL_COMMUNITY): Payer: Self-pay

## 2020-10-27 MED ORDER — ONDANSETRON 4 MG PO TBDP
ORAL_TABLET | ORAL | 0 refills | Status: DC
Start: 2020-10-27 — End: 2020-11-04

## 2020-10-27 MED ORDER — DOXYCYCLINE HYCLATE 100 MG PO CAPS: 100.0000 mg | ORAL_CAPSULE | Freq: Two times a day (BID) | ORAL | 0 refills | Status: DC

## 2020-10-27 NOTE — ED Notes (Signed)
Pt placed on continuous monitor.

## 2020-10-27 NOTE — ED Notes (Signed)
Pt in the shower 

## 2020-10-27 NOTE — ED Notes (Addendum)
RN attempted restart of IV x 2 ; IV team consult placed due to patient needing IV antibiotics Monique,RN

## 2020-10-27 NOTE — ED Notes (Signed)
Dr. Benny Lennert informed that this patient was psych cleared yesterday but now has PNA. This provider agreed to take care of this pt.

## 2020-10-27 NOTE — ED Notes (Signed)
Patient returned from CT scan.

## 2020-10-27 NOTE — ED Notes (Signed)
Patient transported to CT 

## 2020-10-28 LAB — URINE CULTURE: Culture: <10000 — AB

## 2020-10-29 ENCOUNTER — Emergency Department (HOSPITAL_COMMUNITY)
Admission: EM | Admit: 2020-10-29 | Discharge: 2020-10-30 | Disposition: A | Discharge status: Other Acute Inpatient Hospital | Payer: Self-pay | Attending: Student | Admitting: Student

## 2020-10-29 ENCOUNTER — Encounter (HOSPITAL_COMMUNITY): Payer: Self-pay | Admitting: Emergency Medicine

## 2020-10-29 ENCOUNTER — Other Ambulatory Visit: Payer: Self-pay

## 2020-10-29 DIAGNOSIS — Z8616 Personal history of COVID-19: Secondary | ICD-10-CM | POA: Insufficient documentation

## 2020-10-29 DIAGNOSIS — F17210 Nicotine dependence, cigarettes, uncomplicated: Secondary | ICD-10-CM | POA: Insufficient documentation

## 2020-10-29 DIAGNOSIS — Y907 Blood alcohol level of 200-239 mg/100 ml: Secondary | ICD-10-CM | POA: Insufficient documentation

## 2020-10-29 DIAGNOSIS — F250 Schizoaffective disorder, bipolar type: Secondary | ICD-10-CM | POA: Insufficient documentation

## 2020-10-29 DIAGNOSIS — T1491XA Suicide attempt, initial encounter: Secondary | ICD-10-CM

## 2020-10-29 DIAGNOSIS — T402X2A Poisoning by other opioids, intentional self-harm, initial encounter: Secondary | ICD-10-CM | POA: Insufficient documentation

## 2020-10-29 DIAGNOSIS — Z20822 Contact with and (suspected) exposure to covid-19: Secondary | ICD-10-CM | POA: Insufficient documentation

## 2020-10-29 DIAGNOSIS — E876 Hypokalemia: Secondary | ICD-10-CM | POA: Insufficient documentation

## 2020-10-29 DIAGNOSIS — T50902A Poisoning by unspecified drugs, medicaments and biological substances, intentional self-harm, initial encounter: Secondary | ICD-10-CM

## 2020-10-29 DIAGNOSIS — T401X2A Poisoning by heroin, intentional self-harm, initial encounter: Secondary | ICD-10-CM | POA: Insufficient documentation

## 2020-10-29 DIAGNOSIS — T43622A Poisoning by amphetamines, intentional self-harm, initial encounter: Secondary | ICD-10-CM | POA: Insufficient documentation

## 2020-10-29 DIAGNOSIS — F4310 Post-traumatic stress disorder, unspecified: Secondary | ICD-10-CM | POA: Insufficient documentation

## 2020-10-29 DIAGNOSIS — F1020 Alcohol dependence, uncomplicated: Secondary | ICD-10-CM | POA: Insufficient documentation

## 2020-10-29 LAB — COMPREHENSIVE METABOLIC PANEL
ALT: 34 U/L (ref 0–44)
AST: 55 U/L — ABNORMAL HIGH (ref 15–41)
Albumin: 3.2 g/dL — ABNORMAL LOW (ref 3.5–5.0)
Alkaline Phosphatase: 62 U/L (ref 38–126)
Anion gap: 9 (ref 5–15)
BUN: 7 mg/dL (ref 6–20)
CO2: 26 mmol/L (ref 22–32)
Calcium: 8.8 mg/dL — ABNORMAL LOW (ref 8.9–10.3)
Chloride: 104 mmol/L (ref 98–111)
Creatinine, Ser: 0.79 mg/dL (ref 0.61–1.24)
GFR, Estimated: >60 mL/min (ref >60)
Glucose, Bld: 88 mg/dL (ref 70–99)
Potassium: 3.2 mmol/L — ABNORMAL LOW (ref 3.5–5.1)
Sodium: 139 mmol/L (ref 135–145)
Total Bilirubin: 0.5 mg/dL (ref 0.3–1.2)
Total Protein: 8.9 g/dL — ABNORMAL HIGH (ref 6.5–8.1)

## 2020-10-29 LAB — SALICYLATE LEVEL: Salicylate Lvl: <7 mg/dL — ABNORMAL LOW (ref 7.0–30.0)

## 2020-10-29 LAB — RESP PANEL BY RT-PCR (FLU A&B, COVID) ARPGX2
Influenza A by PCR: NEGATIVE
Influenza B by PCR: NEGATIVE
SARS Coronavirus 2 by RT PCR: NEGATIVE

## 2020-10-29 LAB — CBC WITH DIFFERENTIAL/PLATELET
Abs Immature Granulocytes: 0.03 10*3/uL (ref 0.00–0.07)
Basophils Absolute: 0.1 10*3/uL (ref 0.0–0.1)
Basophils Relative: 1 %
Eosinophils Absolute: 0.3 10*3/uL (ref 0.0–0.5)
Eosinophils Relative: 5 %
HCT: 40.5 % (ref 39.0–52.0)
Hemoglobin: 13.3 g/dL (ref 13.0–17.0)
Immature Granulocytes: 1 %
Lymphocytes Relative: 39 %
Lymphs Abs: 1.9 10*3/uL (ref 0.7–4.0)
MCH: 32.8 pg (ref 26.0–34.0)
MCHC: 32.8 g/dL (ref 30.0–36.0)
MCV: 99.8 fL (ref 80.0–100.0)
Monocytes Absolute: 0.7 10*3/uL (ref 0.1–1.0)
Monocytes Relative: 15 %
Neutro Abs: 1.9 10*3/uL (ref 1.7–7.7)
Neutrophils Relative %: 39 %
Platelets: 150 10*3/uL (ref 150–400)
RBC: 4.06 MIL/uL — ABNORMAL LOW (ref 4.22–5.81)
RDW: 12.5 % (ref 11.5–15.5)
WBC: 4.9 10*3/uL (ref 4.0–10.5)
nRBC: 0 % (ref 0.0–0.2)

## 2020-10-29 LAB — ACETAMINOPHEN LEVEL: Acetaminophen (Tylenol), Serum: <10 ug/mL — ABNORMAL LOW (ref 10–30)

## 2020-10-29 LAB — CBG MONITORING, ED: Glucose-Capillary: 93 mg/dL (ref 70–99)

## 2020-10-29 LAB — ETHANOL: Alcohol, Ethyl (B): 239 mg/dL — ABNORMAL HIGH (ref <10)

## 2020-10-29 MED ORDER — LORAZEPAM 1 MG PO TABS: 0.0000 mg | ORAL_TABLET | Freq: Four times a day (QID) | ORAL | Status: DC

## 2020-10-29 MED ORDER — LORAZEPAM 2 MG/ML IJ SOLN: 0.0000 mg | Freq: Two times a day (BID) | INTRAMUSCULAR | Status: DC

## 2020-10-29 MED ORDER — LORAZEPAM 1 MG PO TABS: 0.0000 mg | ORAL_TABLET | Freq: Two times a day (BID) | ORAL | Status: DC

## 2020-10-29 MED ORDER — THIAMINE HCL 100 MG PO TABS: 100.0000 mg | ORAL_TABLET | Freq: Every day | ORAL | Status: DC

## 2020-10-29 MED ORDER — LORAZEPAM 2 MG/ML IJ SOLN
0.0000 mg | Freq: Four times a day (QID) | INTRAMUSCULAR | Status: DC
  Administered 2020-10-29: 1 mg via INTRAVENOUS
  Filled 2020-10-29: qty 1

## 2020-10-29 MED ORDER — THIAMINE HCL 100 MG/ML IJ SOLN
100.0000 mg | Freq: Every day | INTRAMUSCULAR | Status: DC
  Administered 2020-10-29: 100 mg via INTRAVENOUS
  Filled 2020-10-29: qty 2

## 2020-10-29 NOTE — ED Notes (Signed)
Unable to chart correct CIWA due to patient is sleeping/ snoring.

## 2020-10-29 NOTE — ED Triage Notes (Signed)
Pt arrived via EMS after an intentional overdose of heroin and meth. Pt reports 35 CC of heroin and 40 CC of meth. Pt is A&Ox4 and able to scoot to the stretcher. EMS gave 2mg  of narcan intranasal on scene. [place] was [place] woods.

## 2020-10-29 NOTE — ED Notes (Signed)
IV attempted by 2 RN unsuccessfully, several sticks. MD attempted x2 with U/S

## 2020-10-29 NOTE — BH Assessment (Signed)
TTS to evaluate upon medical clearance

## 2020-10-29 NOTE — ED Provider Notes (Signed)
[facility]-EMERGENCY DEPT Provider Note   CSN: [id number] Arrival date & time: 10/29/20  1728     History Chief Complaint  Patient presents with   Ingestion   Suicide Attempt    Jerry [name] is a 45 y.o. male who presents to the emergency department for evaluation of overdose and suicidal ideation.  Patient states that he "just wanted to end" and took 35 cc of heroin and 40 cc of meth in an attempt to kill himself today.  He was found in the woods by EMS and was given 2 mg of Narcan intranasally return of appropriate mental status.  Patient states that he drinks 2 fifths of alcohol a day, and wants to get into appropriate rehab.  He arrives very tearful and currently endorses active SI.  Denies auditory visual hallucinations, denies HI.   Ingestion Pertinent negatives include no chest pain, no abdominal pain and no shortness of breath.      Past Medical History:  Diagnosis Date   Anxiety    Bipolar 1 disorder (HCC)    Depression    Hallucination    PTSD (post-traumatic stress disorder)     Patient Active Problem List   Diagnosis Date Noted   Auditory hallucination    Malingering 09/07/2020   Homelessness 09/07/2020   Substance induced mood disorder (HCC) 09/07/2020   Major depressive disorder, recurrent severe without psychotic features (HCC) 07/17/2020   Schizophrenia spectrum disorder with psychotic disorder type not yet determined (HCC) 07/17/2020   Methamphetamine abuse (HCC) 07/17/2020   Marijuana abuse 07/17/2020   Cocaine abuse (HCC) 07/17/2020   Anxiety and depression 06/25/2014   Family history of diabetes mellitus (DM) 06/25/2014   Family history of thyroid disease 06/25/2014   Tobacco abuse 06/25/2014   Alcohol abuse 05/18/2013   Polysubstance abuse (HCC) 05/18/2013   Depression, unspecified 05/18/2013    Past Surgical History:  Procedure Laterality Date   KIDNEY DONATION Right 2003       Family History  Problem Relation Age  of Onset   Heart disease Mother    Cancer Mother    Heart disease Father    Diabetes Father    Diabetes Maternal Grandmother    Stroke Paternal Grandfather     Social History   Tobacco Use   Smoking status: Every Day    Packs/day: 2.00    Types: Cigarettes   Smokeless tobacco: Never  Substance Use Topics   Alcohol use: Yes    Alcohol/week: 32.0 standard drinks    Types: 12 Cans of beer, 20 Shots of liquor per week    Comment:  last use was 1 month ago    Drug use: Yes    Types: Marijuana, Cocaine, Heroin, Amphetamines, Methamphetamines    Comment: history of IVDA    Home Medications Prior to Admission medications   Medication Sig Start Date End Date Taking? Authorizing Provider  ARIPiprazole (ABILIFY) 10 MG tablet Take 10 mg by mouth daily. Patient not taking: No sig reported    [provider]  doxycycline (VIBRAMYCIN) 100 MG capsule Take 1 capsule (100 mg total) by mouth 2 (two) times daily. One po bid x 7 days 10/27/20   Bethann Berkshire, MD  gabapentin (NEURONTIN) 100 MG capsule Take 1 capsule (100 mg total) by mouth 3 (three) times daily. For agitation Patient not taking: No sig reported 07/21/20   Armandina Stammer I, NP  nicotine (NICODERM CQ - DOSED IN MG/24 HOURS) 21 mg/24hr patch Place 1 patch (21  mg total) onto the skin daily. For smoking cessation Patient not taking: No sig reported 07/22/20   Armandina Stammer I, NP  ondansetron (ZOFRAN ODT) 4 MG disintegrating tablet 4mg  ODT q4 hours prn nausea/vomit Patient not taking: Reported on 10/29/2020 10/27/20   12/27/20, MD  prazosin (MINIPRESS) 2 MG capsule Take 2 mg by mouth at bedtime. Patient not taking: No sig reported    [provider]  risperiDONE (RISPERDAL) 1 MG tablet Take 1 tablet (1 mg total) by mouth at bedtime. For mood control Patient not taking: No sig reported 07/21/20   09/20/20 I, NP  traZODone (DESYREL) 50 MG tablet Take 1 tablet (50 mg total) by mouth at bedtime as needed for  sleep. Patient not taking: No sig reported 07/21/20   09/20/20 I, NP    Allergies    Penicillins  Review of Systems   Review of Systems  Constitutional:  Negative for chills and fever.  HENT:  Negative for ear pain and sore throat.   Eyes:  Negative for pain and visual disturbance.  Respiratory:  Negative for cough and shortness of breath.   Cardiovascular:  Negative for chest pain and palpitations.  Gastrointestinal:  Negative for abdominal pain and vomiting.  Genitourinary:  Negative for dysuria and hematuria.  Musculoskeletal:  Negative for arthralgias and back pain.  Skin:  Negative for color change and rash.  Neurological:  Negative for seizures and syncope.  Psychiatric/Behavioral:  Positive for dysphoric mood and suicidal ideas.   All other systems reviewed and are negative.  Physical Exam Updated Vital Signs BP 118/85   Pulse 91   Temp 98.2 F (36.8 C) (Oral)   Resp 16   Ht 5\' 9"  (1.753 m)   Wt 68.9 kg   SpO2 97%   BMI 22.43 kg/m   Physical Exam Vitals and nursing note reviewed.  Constitutional:      Appearance: He is well-developed.  HENT:     Head: Normocephalic and atraumatic.  Eyes:     Conjunctiva/sclera: Conjunctivae normal.  Cardiovascular:     Rate and Rhythm: Normal rate and regular rhythm.     Heart sounds: No murmur heard. Pulmonary:     Effort: Pulmonary effort is normal. No respiratory distress.     Breath sounds: Normal breath sounds.  Abdominal:     Palpations: Abdomen is soft.     Tenderness: There is no abdominal tenderness.  Musculoskeletal:     Cervical back: Neck supple.  Skin:    General: Skin is warm and dry.  Neurological:     Mental Status: He is alert.    ED Results / Procedures / Treatments   Labs (all labs ordered are listed, but only abnormal results are displayed) Labs Reviewed  SALICYLATE LEVEL - Abnormal; Notable for the following components:      Result Value   Salicylate Lvl <7.0 (*)    All other components  within normal limits  ACETAMINOPHEN LEVEL - Abnormal; Notable for the following components:   Acetaminophen (Tylenol), Serum <10 (*)    All other components within normal limits  ETHANOL - Abnormal; Notable for the following components:   Alcohol, Ethyl (B) 239 (*)    All other components within normal limits  CBC WITH DIFFERENTIAL/PLATELET - Abnormal; Notable for the following components:   RBC 4.06 (*)    All other components within normal limits  COMPREHENSIVE METABOLIC PANEL - Abnormal; Notable for the following components:   Potassium 3.2 (*)  Calcium 8.8 (*)    Total Protein 8.9 (*)    Albumin 3.2 (*)    AST 55 (*)    All other components within normal limits  RESP PANEL BY RT-PCR (FLU A&B, COVID) ARPGX2  RAPID URINE DRUG SCREEN, HOSP PERFORMED  CBG MONITORING, ED    EKG EKG Interpretation  Date/Time:  Friday October 29 2020 19:01:23 EDT Ventricular Rate:  100 PR Interval:  174 QRS Duration: 100 QT Interval:  346 QTC Calculation: 447 R Axis:   86 Text Interpretation: Sinus tachycardia Probable left atrial enlargement Left ventricular hypertrophy Anterior ST elevation, probably due to LVH Confirmed by Zadie Rhine ([id number]) on 10/29/2020 11:59:29 PM  Radiology No results found.  Procedures Procedures   Medications Ordered in ED Medications  LORazepam (ATIVAN) injection 0-4 mg (0 mg Intravenous Hold 10/30/20 0004)    Or  LORazepam (ATIVAN) tablet 0-4 mg ( Oral See Alternative 10/30/20 0004)  LORazepam (ATIVAN) injection 0-4 mg (has no administration in time range)    Or  LORazepam (ATIVAN) tablet 0-4 mg (has no administration in time range)  thiamine tablet 100 mg ( Oral See Alternative 10/29/20 2025)    Or  thiamine (B-1) injection 100 mg (100 mg Intravenous Given 10/29/20 2025)    ED Course  I have reviewed the triage vital signs and the nursing notes.  Pertinent labs & imaging results that were available during my care of the patient were reviewed by me  and considered in my medical decision making (see chart for details).    MDM Rules/Calculators/A&P                           Patient seen the emergency department for evaluation of intentional overdose and suicidal ideation.  Physical exam is unremarkable.  Laboratory evaluation reveals an alcohol level of 239, hypokalemia to 3.2, AST elevated to 55, negative aspirin and Tylenol, CBC unremarkable.  TTS consulted and patient signed out to oncoming provider.  Disposition pending psychiatry.  Anticipate admission. Final Clinical Impression(s) / ED Diagnoses Final diagnoses:  None    Rx / DC Orders ED Discharge Orders     None        Remberto Lienhard, Wyn Forster, MD 10/30/20 [phone]

## 2020-10-30 ENCOUNTER — Inpatient Hospital Stay (HOSPITAL_COMMUNITY)
Admission: AD | Admit: 2020-10-30 | Discharge: 2020-11-04 | DRG: 885 | Disposition: A | Discharge status: Home / Self Care | Payer: Federal, State, Local not specified - Other | Source: Intra-hospital | Attending: Emergency Medicine | Admitting: Emergency Medicine

## 2020-10-30 ENCOUNTER — Encounter (HOSPITAL_COMMUNITY): Payer: Self-pay | Admitting: Urology

## 2020-10-30 DIAGNOSIS — Z20822 Contact with and (suspected) exposure to covid-19: Secondary | ICD-10-CM | POA: Diagnosis present

## 2020-10-30 DIAGNOSIS — F1510 Other stimulant abuse, uncomplicated: Secondary | ICD-10-CM | POA: Diagnosis present

## 2020-10-30 DIAGNOSIS — F17210 Nicotine dependence, cigarettes, uncomplicated: Secondary | ICD-10-CM | POA: Diagnosis present

## 2020-10-30 DIAGNOSIS — F332 Major depressive disorder, recurrent severe without psychotic features: Secondary | ICD-10-CM | POA: Diagnosis present

## 2020-10-30 DIAGNOSIS — Y907 Blood alcohol level of 200-239 mg/100 ml: Secondary | ICD-10-CM | POA: Diagnosis present

## 2020-10-30 DIAGNOSIS — F29 Unspecified psychosis not due to a substance or known physiological condition: Principal | ICD-10-CM | POA: Diagnosis present

## 2020-10-30 DIAGNOSIS — F1010 Alcohol abuse, uncomplicated: Secondary | ICD-10-CM | POA: Diagnosis present

## 2020-10-30 DIAGNOSIS — E876 Hypokalemia: Secondary | ICD-10-CM | POA: Diagnosis present

## 2020-10-30 DIAGNOSIS — F1210 Cannabis abuse, uncomplicated: Secondary | ICD-10-CM | POA: Diagnosis present

## 2020-10-30 DIAGNOSIS — R45851 Suicidal ideations: Secondary | ICD-10-CM | POA: Diagnosis present

## 2020-10-30 DIAGNOSIS — J189 Pneumonia, unspecified organism: Secondary | ICD-10-CM

## 2020-10-30 DIAGNOSIS — F1410 Cocaine abuse, uncomplicated: Secondary | ICD-10-CM | POA: Diagnosis present

## 2020-10-30 DIAGNOSIS — F1910 Other psychoactive substance abuse, uncomplicated: Secondary | ICD-10-CM | POA: Diagnosis present

## 2020-10-30 DIAGNOSIS — Z9151 Personal history of suicidal behavior: Secondary | ICD-10-CM | POA: Diagnosis not present

## 2020-10-30 HISTORY — DX: Paranoid schizophrenia: F20.0

## 2020-10-30 LAB — RAPID URINE DRUG SCREEN, HOSP PERFORMED
Amphetamines: POSITIVE — AB
Barbiturates: NOT DETECTED
Benzodiazepines: NOT DETECTED
Cocaine: POSITIVE — AB
Opiates: NOT DETECTED
Tetrahydrocannabinol: POSITIVE — AB

## 2020-10-30 MED ORDER — ACETAMINOPHEN 325 MG PO TABS
650.0000 mg | ORAL_TABLET | Freq: Four times a day (QID) | ORAL | Status: DC | PRN
  Administered 2020-10-31: 650 mg via ORAL
  Filled 2020-10-30: qty 2

## 2020-10-30 MED ORDER — LOPERAMIDE HCL 2 MG PO CAPS: 2.0000 mg | ORAL_CAPSULE | ORAL | Status: AC | PRN

## 2020-10-30 MED ORDER — ONDANSETRON 4 MG PO TBDP
4.0000 mg | ORAL_TABLET | Freq: Four times a day (QID) | ORAL | Status: DC | PRN
  Administered 2020-10-30: 4 mg via ORAL

## 2020-10-30 MED ORDER — MAGNESIUM HYDROXIDE 400 MG/5ML PO SUSP: 30.0000 mL | Freq: Every day | ORAL | Status: DC | PRN

## 2020-10-30 MED ORDER — THIAMINE HCL 100 MG PO TABS
100.0000 mg | ORAL_TABLET | Freq: Every day | ORAL | Status: DC
  Administered 2020-10-31 – 2020-11-04 (×5): 100 mg via ORAL
  Filled 2020-10-30 (×6): qty 1

## 2020-10-30 MED ORDER — CLINDAMYCIN HCL 300 MG PO CAPS
300.0000 mg | ORAL_CAPSULE | Freq: Three times a day (TID) | ORAL | Status: DC
  Administered 2020-10-30 – 2020-11-04 (×14): 300 mg via ORAL
  Filled 2020-10-30 (×6): qty 1
  Filled 2020-10-30 (×2): qty 11
  Filled 2020-10-30 (×12): qty 1
  Filled 2020-10-30: qty 11

## 2020-10-30 MED ORDER — ADULT MULTIVITAMIN W/MINERALS CH
1.0000 | ORAL_TABLET | Freq: Every day | ORAL | Status: DC
  Administered 2020-10-30 – 2020-11-04 (×6): 1 via ORAL
  Filled 2020-10-30 (×7): qty 1

## 2020-10-30 MED ORDER — LORAZEPAM 1 MG PO TABS
1.0000 mg | ORAL_TABLET | Freq: Four times a day (QID) | ORAL | Status: AC | PRN
  Administered 2020-10-30 – 2020-10-31 (×3): 1 mg via ORAL
  Filled 2020-10-30 (×3): qty 1

## 2020-10-30 MED ORDER — HYDROXYZINE HCL 25 MG PO TABS
25.0000 mg | ORAL_TABLET | Freq: Four times a day (QID) | ORAL | Status: AC | PRN
Start: 2020-10-30 — End: 2020-11-02
  Administered 2020-10-30 – 2020-11-01 (×4): 25 mg via ORAL
  Filled 2020-10-30 (×4): qty 1

## 2020-10-30 MED ORDER — PANTOPRAZOLE SODIUM 40 MG PO TBEC
40.0000 mg | DELAYED_RELEASE_TABLET | Freq: Every day | ORAL | Status: DC
  Administered 2020-10-30 – 2020-11-04 (×6): 40 mg via ORAL
  Filled 2020-10-30 (×10): qty 1

## 2020-10-30 MED ORDER — ONDANSETRON 4 MG PO TBDP
4.0000 mg | ORAL_TABLET | Freq: Four times a day (QID) | ORAL | Status: AC | PRN
  Administered 2020-10-30: 4 mg via ORAL
  Filled 2020-10-30 (×3): qty 1

## 2020-10-30 MED ORDER — NICOTINE 21 MG/24HR TD PT24
21.0000 mg | MEDICATED_PATCH | Freq: Every day | TRANSDERMAL | Status: DC
  Administered 2020-10-30 – 2020-11-04 (×6): 21 mg via TRANSDERMAL
  Filled 2020-10-30 (×9): qty 1

## 2020-10-30 MED ORDER — ENSURE ENLIVE PO LIQD
237.0000 mL | Freq: Two times a day (BID) | ORAL | Status: DC
  Administered 2020-10-30 – 2020-11-04 (×11): 237 mL via ORAL
  Filled 2020-10-30 (×13): qty 237

## 2020-10-30 MED ORDER — RISPERIDONE 1 MG PO TABS
1.0000 mg | ORAL_TABLET | Freq: Every day | ORAL | Status: DC
  Administered 2020-10-30: 1 mg via ORAL
  Filled 2020-10-30 (×4): qty 1

## 2020-10-30 MED ORDER — TRAZODONE HCL 50 MG PO TABS
50.0000 mg | ORAL_TABLET | Freq: Every evening | ORAL | Status: DC | PRN
  Administered 2020-10-30 – 2020-11-03 (×6): 50 mg via ORAL
  Filled 2020-10-30 (×6): qty 1

## 2020-10-30 MED ORDER — NAPROXEN 500 MG PO TABS
500.0000 mg | ORAL_TABLET | Freq: Two times a day (BID) | ORAL | Status: AC | PRN
  Administered 2020-10-30 (×2): 500 mg via ORAL
  Filled 2020-10-30 (×3): qty 1

## 2020-10-30 MED ORDER — SACCHAROMYCES BOULARDII 250 MG PO CAPS
250.0000 mg | ORAL_CAPSULE | Freq: Two times a day (BID) | ORAL | Status: DC
  Administered 2020-10-30 – 2020-11-04 (×10): 250 mg via ORAL
  Filled 2020-10-30 (×14): qty 1

## 2020-10-30 MED ORDER — POTASSIUM CHLORIDE CRYS ER 20 MEQ PO TBCR
20.0000 meq | EXTENDED_RELEASE_TABLET | Freq: Two times a day (BID) | ORAL | Status: AC
  Administered 2020-10-30 – 2020-10-31 (×2): 20 meq via ORAL
  Filled 2020-10-30 (×2): qty 1

## 2020-10-30 MED ORDER — RISPERIDONE 0.5 MG PO TABS
0.5000 mg | ORAL_TABLET | Freq: Every day | ORAL | Status: DC
  Administered 2020-10-30 – 2020-10-31 (×2): 0.5 mg via ORAL
  Filled 2020-10-30 (×5): qty 1

## 2020-10-30 MED ORDER — ALUM & MAG HYDROXIDE-SIMETH 200-200-20 MG/5ML PO SUSP: 30.0000 mL | ORAL | Status: DC | PRN

## 2020-10-30 MED ORDER — METHOCARBAMOL 500 MG PO TABS
500.0000 mg | ORAL_TABLET | Freq: Three times a day (TID) | ORAL | Status: AC | PRN
  Administered 2020-10-31 – 2020-11-01 (×3): 500 mg via ORAL
  Filled 2020-10-30 (×3): qty 1

## 2020-10-30 MED ORDER — DICYCLOMINE HCL 20 MG PO TABS
20.0000 mg | ORAL_TABLET | Freq: Four times a day (QID) | ORAL | Status: AC | PRN
  Administered 2020-10-31: 20 mg via ORAL
  Filled 2020-10-30: qty 1
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
## 2020-10-30 NOTE — H&P (Signed)
Psychiatric Admission Assessment Adult  Patient Identification: Jerry [name] MRN:  [id number] Date of Evaluation:  10/30/2020 Chief Complaint:  MDD (major depressive disorder), recurrent severe, without psychosis (HCC) [F33.2] Principal Diagnosis: MDD (major depressive disorder), recurrent severe, without psychosis (HCC) Diagnosis:  Principal Problem:   MDD (major depressive disorder), recurrent severe, without psychosis (HCC) Active Problems:   Polysubstance abuse (HCC)  History of Present Illness:  Jerry [name] is a 45 yr old male who presents with SI and command hallucinations. PPHx is significant for Schizophrenia, PTSD, and polysubstance abuse (EtOH, Meth, Cocaine, and THC), [facility].  He reports that he had been sober for 3 years 2018-2020 but then started drinking again. He reports that along with drinking he has been using multiple substances.  He reports that he is used heroine, meth, ice, Coke, THC, "anything I can get my hands on."  He reports that he has only been on heroin 6 times in the past accidentally.  He reports he recently got back together with the mother of his oldest son he states that this was a bad idea because they never worked out.  He reports however that they once again broke up and that he had been living for several months with "friends" in motel rooms.  He reports that they then had a falling out and so for the last week or 2 has been living in the woods.  He reports that all of this culminated in a desire for him to end his life.  He reports that he took 35 cc of heroin and 40 cc of methamphetamines and injected himself with it to intentionally overdose.  He was found down by paramedics and given Narcan and brought to the hospital.  He reports that the voices in his head have always been there since he was approximately 45 years old.  He reports that lately the voices have gotten worse telling him bad things like to hurt himself.  He reports seeing  people.  He reports that he will be walking and turned around and suddenly someone who he thought was staring there was no longer standing there.  He does report paranoia thinking that people are following him.  Currently he reports he still has SI and command auditory hallucinations.  He reports no HI and no current visual hallucinations.  Patient has an extensive history of Per chart review.  He has been in psych hospitalizations multiple times and [place]  He has been to rehab approximately 10 times by his count.  He has 1 previous suicide attempt of walking into traffic.  He reports that over the last few weeks he has felt depressed, anhedonic, feelings of worthlessness/guilt, feelings of hopelessness, and issues with sleep.  He reports that previously he was on Abilify, gabapentin, prazosin, Risperdal, and trazodone.  He reports that he did not continue taking these because he could not afford the medications.  He also reports he could not attend follow-up appointments because he would have no way to get there.  He reports using several substances as listed above.  He reports drinking 2/5 of alcohol daily.  He expresses the desire to go to long-term rehab.  Associated Signs/Symptoms: Depression Symptoms:  depressed mood, anhedonia, fatigue, feelings of worthlessness/guilt, hopelessness, suicidal attempt, anxiety, loss of energy/fatigue, Duration of Depression Symptoms: Greater than two weeks  (Hypo) Manic Symptoms:   Reports None Anxiety Symptoms:  Excessive Worry, Panic Symptoms, Psychotic  Symptoms:  Hallucinations: Command:  voices telling him to hurt himself Paranoia, PTSD Symptoms: NA Total Time spent with patient: 45 minutes  Past Psychiatric History: Schizophrenia, PTSD, and polysubstance abuse (EtOH, Meth, Cocaine, and THC), last admission [facility] on 6/2.  Is the patient at risk to self? Yes.    Has the patient been  a risk to self in the past 6 months? Yes.    Has the patient been a risk to self within the distant past? Yes.    Is the patient a risk to others? No.  Has the patient been a risk to others in the past 6 months? No.  Has the patient been a risk to others within the distant past? No.   Prior Inpatient Therapy:   Prior Outpatient Therapy:    Alcohol Screening: 1. How often do you have a drink containing alcohol?: 4 or more times a week 2. How many drinks containing alcohol do you have on a typical day when you are drinking?: 10 or more 3. How often do you have six or more drinks on one occasion?: Daily or almost daily AUDIT-C Score: 12 4. How often during the last year have you found that you were not able to stop drinking once you had started?: Weekly 5. How often during the last year have you failed to do what was normally expected from you because of drinking?: Weekly 6. How often during the last year have you needed a first drink in the morning to get yourself going after a heavy drinking session?: Weekly 7. How often during the last year have you had a feeling of guilt of remorse after drinking?: Weekly 8. How often during the last year have you been unable to remember what happened the night before because you had been drinking?: Weekly 9. Have you or someone else been injured as a result of your drinking?: No 10. Has a relative or friend or a doctor or another health worker been concerned about your drinking or suggested you cut down?: Yes, during the last year Alcohol Use Disorder Identification Test Final Score (AUDIT): 31 Alcohol Brief Interventions/Follow-up: Alcohol education/Brief advice Substance Abuse History in the last 12 months:  Yes.   Consequences of Substance Abuse: Medical Consequences:  Overdose with suicide attempt Previous Psychotropic Medications: Yes Abilify, Gabapentin, Prazosin, Risperdal, Trazodone Psychological Evaluations: No  Past Medical History:  Past  Medical History:  Diagnosis Date   Anxiety    Bipolar 1 disorder (HCC)    Depression    Hallucination    Paranoid schizophrenia (HCC)    since pt's 20's   PTSD (post-traumatic stress disorder)     Past Surgical History:  Procedure Laterality Date   KIDNEY DONATION Right 2003   Family History:  Family History  Problem Relation Age of Onset   Heart disease Mother    Cancer Mother    Heart disease Father    Diabetes Father    Diabetes Maternal Grandmother    Stroke Paternal Grandfather    Family Psychiatric  History: Mother- Some Disorder Aunt- Bipolar Disorder, Suicide Father- EtOH abuse Both sides of family- EtOH abuse Tobacco Screening:   Social History:  Social History   Substance and Sexual Activity  Alcohol Use Yes   Alcohol/week: 32.0 standard drinks   Types: 12 Cans of beer, 20 Shots of liquor per week   Comment: last use 10/28/20- drinks 1/2 gallon vodka daily     Social History   Substance and Sexual Activity  Drug Use  Yes   Types: Marijuana, Cocaine, Heroin, Amphetamines, Methamphetamines   Comment: history of IVDA; last use 10/28/20    Additional Social History:                           Allergies:   Allergies  Allergen Reactions   Penicillins Other (See Comments)    Reaction occurred in childhood   Lab Results:  Results for orders placed or performed during the hospital encounter of 10/29/20 (from the past 48 hour(s))  CBC WITH DIFFERENTIAL     Status: Abnormal   Collection Time: 10/29/20  6:20 PM  Result Value Ref Range   WBC 4.9 4.0 - 10.5 K/uL   RBC 4.06 (L) 4.22 - 5.81 MIL/uL   Hemoglobin 13.3 13.0 - 17.0 g/dL   HCT 28.7 68.1 - 15.7 %   MCV 99.8 80.0 - 100.0 fL   MCH 32.8 26.0 - 34.0 pg   MCHC 32.8 30.0 - 36.0 g/dL   RDW 26.2 03.5 - 59.7 %   Platelets 150 150 - 400 K/uL   nRBC 0.0 0.0 - 0.2 %   Neutrophils Relative % 39 %   Neutro Abs 1.9 1.7 - 7.7 K/uL   Lymphocytes Relative 39 %   Lymphs Abs 1.9 0.7 - 4.0 K/uL    Monocytes Relative 15 %   Monocytes Absolute 0.7 0.1 - 1.0 K/uL   Eosinophils Relative 5 %   Eosinophils Absolute 0.3 0.0 - 0.5 K/uL   Basophils Relative 1 %   Basophils Absolute 0.1 0.0 - 0.1 K/uL   Immature Granulocytes 1 %   Abs Immature Granulocytes 0.03 0.00 - 0.07 K/uL    Comment: Performed at [facility], [address], [place], [place] [address]  Resp Panel by RT-PCR (Flu A&B, Covid) Nasopharyngeal Swab     Status: None   Collection Time: 10/29/20  6:38 PM   Specimen: Nasopharyngeal Swab; Nasopharyngeal(NP) swabs in vial transport medium  Result Value Ref Range   SARS Coronavirus 2 by RT PCR NEGATIVE NEGATIVE    Comment: (NOTE) SARS-CoV-2 target nucleic acids are NOT DETECTED.  The SARS-CoV-2 RNA is generally detectable in upper respiratory specimens during the acute phase of infection. The lowest concentration of SARS-CoV-2 viral copies this assay can detect is 138 copies/mL. A negative result does not preclude SARS-Cov-2 infection and should not be used as the sole basis for treatment or other patient management decisions. A negative result may occur with  improper specimen collection/handling, submission of specimen other than nasopharyngeal swab, presence of viral mutation(s) within the areas targeted by this assay, and inadequate number of viral copies(<138 copies/mL). A negative result must be combined with clinical observations, patient history, and epidemiological information. The expected result is Negative.  Fact Sheet for Patients:  BloggerCourse.com  Fact Sheet for Healthcare Providers:  SeriousBroker.it  This test is no t yet approved or cleared by the [place] FDA and  has been authorized for detection and/or diagnosis of SARS-CoV-2 by FDA under an Emergency Use Authorization (EUA). This EUA will remain  in effect (meaning this test can be used) for the duration of the COVID-19  declaration under Section 564(b)(1) of the Act, 21 U.S.C.section 360bbb-3(b)(1), unless the authorization is terminated  or revoked sooner.       Influenza A by PCR NEGATIVE NEGATIVE   Influenza B by PCR NEGATIVE NEGATIVE    Comment: (NOTE) The Xpert Xpress SARS-CoV-2/FLU/RSV plus assay is intended as an  aid in the diagnosis of influenza from Nasopharyngeal swab specimens and should not be used as a sole basis for treatment. Nasal washings and aspirates are unacceptable for Xpert Xpress SARS-CoV-2/FLU/RSV testing.  Fact Sheet for Patients: BloggerCourse.com  Fact Sheet for Healthcare Providers: SeriousBroker.it  This test is not yet approved or cleared by the [place] FDA and has been authorized for detection and/or diagnosis of SARS-CoV-2 by FDA under an Emergency Use Authorization (EUA). This EUA will remain in effect (meaning this test can be used) for the duration of the COVID-19 declaration under Section 564(b)(1) of the Act, 21 U.S.C. section 360bbb-3(b)(1), unless the authorization is terminated or revoked.  Performed at [facility], [address], [place], [place] [address]   Salicylate level     Status: Abnormal   Collection Time: 10/29/20  6:39 PM  Result Value Ref Range   Salicylate Lvl <7.0 (L) 7.0 - 30.0 mg/dL    Comment: Performed at [facility], [address], [place], [place] [address]  Acetaminophen level     Status: Abnormal   Collection Time: 10/29/20  6:39 PM  Result Value Ref Range   Acetaminophen (Tylenol), Serum <10 (L) 10 - 30 ug/mL    Comment: (NOTE) Therapeutic concentrations vary significantly. A range of 10-30 ug/mL  may be an effective concentration for many patients. However, some  are best treated at concentrations outside of this range. Acetaminophen concentrations >150 ug/mL at 4 hours after ingestion  and >50 ug/mL at 12 hours after ingestion  are often associated with  toxic reactions.  Performed at [facility], [address] [place], [place] [address]   Ethanol     Status: Abnormal   Collection Time: 10/29/20  6:39 PM  Result Value Ref Range   Alcohol, Ethyl (B) 239 (H) <10 mg/dL    Comment: (NOTE) Lowest detectable limit for serum alcohol is 10 mg/dL.  For medical purposes only. Performed at [facility], [address], [place], [place] [address]   Comprehensive metabolic panel     Status: Abnormal   Collection Time: 10/29/20  6:58 PM  Result Value Ref Range   Sodium 139 135 - 145 mmol/L   Potassium 3.2 (L) 3.5 - 5.1 mmol/L   Chloride 104 98 - 111 mmol/L   CO2 26 22 - 32 mmol/L   Glucose, Bld 88 70 - 99 mg/dL    Comment: Glucose reference range applies only to samples taken after fasting for at least 8 hours.   BUN 7 6 - 20 mg/dL   Creatinine, Ser 2.95 0.61 - 1.24 mg/dL   Calcium 8.8 (L) 8.9 - 10.3 mg/dL   Total Protein 8.9 (H) 6.5 - 8.1 g/dL   Albumin 3.2 (L) 3.5 - 5.0 g/dL   AST 55 (H) 15 - 41 U/L   ALT 34 0 - 44 U/L   Alkaline Phosphatase 62 38 - 126 U/L   Total Bilirubin 0.5 0.3 - 1.2 mg/dL   GFR, Estimated >62 >13 mL/min    Comment: (NOTE) Calculated using the CKD-EPI Creatinine Equation (2021)    Anion gap 9 5 - 15    Comment: Performed at [facility], [address], [place], [place] [address]  CBG monitoring, ED     Status: None   Collection Time: 10/29/20  7:03 PM  Result Value Ref Range   Glucose-Capillary 93 70 - 99 mg/dL    Comment: Glucose reference range applies only to samples  taken after fasting for at least 8 hours.  Urine rapid drug screen (hosp performed)     Status: Abnormal   Collection Time: 10/30/20  3:16 AM  Result Value Ref Range   Opiates NONE DETECTED NONE DETECTED   Cocaine POSITIVE (A) NONE DETECTED   Benzodiazepines NONE DETECTED NONE DETECTED   Amphetamines POSITIVE (A) NONE DETECTED   Tetrahydrocannabinol  POSITIVE (A) NONE DETECTED   Barbiturates NONE DETECTED NONE DETECTED    Comment: (NOTE) DRUG SCREEN FOR MEDICAL PURPOSES ONLY.  IF CONFIRMATION IS NEEDED FOR ANY PURPOSE, NOTIFY LAB WITHIN 5 DAYS.  LOWEST DETECTABLE LIMITS FOR URINE DRUG SCREEN Drug Class                     Cutoff (ng/mL) Amphetamine and metabolites    1000 Barbiturate and metabolites    200 Benzodiazepine                 200 Tricyclics and metabolites     300 Opiates and metabolites        300 Cocaine and metabolites        300 THC                            50 Performed at [facility], [address] [place], [place] [address]     Blood Alcohol level:  Lab Results  Component Value Date   ETH 239 (H) 10/29/2020   ETH 18 (H) 10/25/2020    Metabolic Disorder Labs:  Lab Results  Component Value Date   HGBA1C 5.3 07/17/2020   MPG 105.41 07/17/2020   MPG 108 06/25/2014   No results found for: PROLACTIN Lab Results  Component Value Date   CHOL 173 07/17/2020   TRIG 106 07/17/2020   HDL 52 07/17/2020   CHOLHDL 3.3 07/17/2020   VLDL 21 07/17/2020   LDLCALC 100 (H) 07/17/2020    Current Medications: Current Facility-Administered Medications  Medication Dose Route Frequency Provider Last Rate Last Admin   acetaminophen (TYLENOL) tablet 650 mg  650 mg Oral Q6H PRN Ajibola, Ene A, NP       alum & mag hydroxide-simeth (MAALOX/MYLANTA) 200-200-20 MG/5ML suspension 30 mL  30 mL Oral Q4H PRN Ajibola, Ene A, NP       clindamycin (CLEOCIN) capsule 300 mg  300 mg Oral Q8H Clary, Marlane Mingle, MD   300 mg at 10/30/20 1155   dicyclomine (BENTYL) tablet 20 mg  20 mg Oral Q6H PRN Ajibola, Ene A, NP       feeding supplement (ENSURE ENLIVE / ENSURE PLUS) liquid 237 mL  237 mL Oral BID BM Ajibola, Ene A, NP   237 mL at 10/30/20 0953   hydrOXYzine (ATARAX/VISTARIL) tablet 25 mg  25 mg Oral Q6H PRN Ajibola, Ene A, NP       loperamide (IMODIUM) capsule 2-4 mg  2-4 mg Oral PRN Ajibola, Ene A, NP        LORazepam (ATIVAN) tablet 1 mg  1 mg Oral Q6H PRN Ajibola, Ene A, NP   1 mg at 10/30/20 1317   magnesium hydroxide (MILK OF MAGNESIA) suspension 30 mL  30 mL Oral Daily PRN Ajibola, Ene A, NP       methocarbamol (ROBAXIN) tablet 500 mg  500 mg Oral Q8H PRN Ajibola, Ene A, NP       multivitamin with minerals tablet 1 tablet  1 tablet Oral Daily Ajibola, Ene A,  NP   1 tablet at 10/30/20 0804   naproxen (NAPROSYN) tablet 500 mg  500 mg Oral BID PRN Ajibola, Ene A, NP   500 mg at 10/30/20 0428   nicotine (NICODERM CQ - dosed in mg/24 hours) patch 21 mg  21 mg Transdermal Daily Ajibola, Ene A, NP   21 mg at 10/30/20 0804   ondansetron (ZOFRAN-ODT) disintegrating tablet 4 mg  4 mg Oral Q6H PRN Ajibola, Ene A, NP   4 mg at 10/30/20 1317   pantoprazole (PROTONIX) EC tablet 40 mg  40 mg Oral Daily Antonieta Pert, MD   40 mg at 10/30/20 1316   potassium chloride SA (KLOR-CON) CR tablet 20 mEq  20 mEq Oral BID Antonieta Pert, MD       risperiDONE (RISPERDAL) tablet 0.5 mg  0.5 mg Oral Daily Cipriano Millikan, Mardelle Matte, MD       risperiDONE (RISPERDAL) tablet 1 mg  1 mg Oral QHS Twanna Resh, Mardelle Matte, MD       saccharomyces boulardii (FLORASTOR) capsule 250 mg  250 mg Oral BID Antonieta Pert, MD       [START ON 10/31/2020] thiamine tablet 100 mg  100 mg Oral Daily Ajibola, Ene A, NP       traZODone (DESYREL) tablet 50 mg  50 mg Oral QHS PRN Ajibola, Ene A, NP   50 mg at 10/30/20 0428   PTA Medications: Medications Prior to Admission  Medication Sig Dispense Refill Last Dose   ARIPiprazole (ABILIFY) 10 MG tablet Take 10 mg by mouth daily. (Patient not taking: No sig reported)      doxycycline (VIBRAMYCIN) 100 MG capsule Take 1 capsule (100 mg total) by mouth 2 (two) times daily. One po bid x 7 days 14 capsule 0    gabapentin (NEURONTIN) 100 MG capsule Take 1 capsule (100 mg total) by mouth 3 (three) times daily. For agitation (Patient not taking: No sig reported) 90 capsule 0    nicotine (NICODERM CQ  - DOSED IN MG/24 HOURS) 21 mg/24hr patch Place 1 patch (21 mg total) onto the skin daily. For smoking cessation (Patient not taking: No sig reported) 1 patch 0    ondansetron (ZOFRAN ODT) 4 MG disintegrating tablet  ODT q4 hours prn nausea/vomit (Patient not taking: Reported on 10/29/2020) 12 tablet 0    prazosin (MINIPRESS) 2 MG capsule Take 2 mg by mouth at bedtime. (Patient not taking: No sig reported)      risperiDONE (RISPERDAL) 1 MG tablet Take 1 tablet (1 mg total) by mouth at bedtime. For mood control (Patient not taking: No sig reported) 30 tablet 0    traZODone (DESYREL) 50 MG tablet Take 1 tablet (50 mg total) by mouth at bedtime as needed for sleep. (Patient not taking: No sig reported) 30 tablet 0     Musculoskeletal: Strength & Muscle Tone: decreased Gait & Station:  in bed during exam Patient leans: N/A            Psychiatric Specialty Exam:  Presentation  General Appearance: Disheveled (diaphoretic)  Eye Contact:Good  Speech:Slow; Clear and Coherent (some garbled/slurred speech)  Speech Volume:Normal  Handedness:Right   Mood and Affect  Mood:Depressed; Anxious; Hopeless; Worthless  Affect:Depressed   Thought Process  Thought Processes:Coherent  Duration of Psychotic Symptoms: Greater than six months  Past Diagnosis of Schizophrenia or Psychoactive disorder: Yes  Descriptions of Associations:Intact  Orientation:Full (Time, Place and Person)  Thought Content:Logical; WDL  Hallucinations:Hallucinations: Auditory Description of Auditory Hallucinations: voices telling  him to hurt himself Ideas of Reference:Paranoia  Suicidal Thoughts:Suicidal Thoughts: Yes, Passive Homicidal Thoughts:Homicidal Thoughts: No  Sensorium  Memory:Immediate Fair; Recent Fair  Judgment:Poor  Insight:Poor   Executive Functions  Concentration:Good  Attention Span:Good  Recall:Good  Fund of Knowledge:Good  Language:Good   Psychomotor Activity   Psychomotor Activity: Psychomotor Activity: Normal  Assets  Assets:Desire for Improvement; Resilience   Sleep  Sleep: Sleep: Fair   Physical Exam: Physical Exam Vitals and nursing note reviewed.  Constitutional:      General: He is not in acute distress.    Appearance: He is normal weight. He is ill-appearing and diaphoretic. He is not toxic-appearing.  HENT:     Head: Normocephalic and atraumatic.  Musculoskeletal:        General: Normal range of motion.  Neurological:     General: No focal deficit present.     Mental Status: He is alert.   Review of Systems  Constitutional:  Positive for diaphoresis.  Respiratory:  Positive for shortness of breath. Negative for cough.   Cardiovascular:  Positive for chest pain.  Gastrointestinal:  Positive for nausea. Negative for abdominal pain, constipation, diarrhea and vomiting.  Neurological:  Positive for tremors and headaches. Negative for weakness.  Psychiatric/Behavioral:  Positive for depression, hallucinations and suicidal ideas.   Blood pressure 114/85, pulse (!) 57, temperature 98.2 F (36.8 C), temperature source Oral, resp. rate 16, height  (1.753 m), weight 74.4 kg, SpO2 97 %. Body mass index is 24.22 kg/m.  Treatment Plan Summary: Daily contact with patient to assess and evaluate symptoms and progress in treatment   Jerry [name] is a 45 yr old male who presents with SI and command hallucinations. PPHx is significant for Schizophrenia, PTSD, and polysubstance abuse (EtOH, Meth, Cocaine, and THC), last admission [facility] on 6/2.   At this time difficult to determine whether hallucinations are due to his reported schizophrenia history or are substance induced.  At this point we will start on low-dose Risperdal and see how he progresses as he withdrawals from substances.  He does complain of some chest pain and cough which x-ray obtained in the ED was suspicious for pneumonia.  He has been started on clindamycin due to  penicillin allergy.  He does have a history of seizures from withdrawal of alcohol so we will have him placed on seizure precautions.  We will maintain a low threshold to send him to the ED given his current state.  Given his IV drug use he has asked for an HIV test which we will obtain.  On admission he had some hypokalemia will supplement with K-Dur.  His EKG showed sinus tach with BP of 100 and a QTC of 447.  We will continue to monitor.   Schizophrenia Vs Substance Induced Thought Disorder: -Start Risperdal 0.5 mg AM and 1 mg QHS   Possible Right Pneumonia: -Start Clindamycin 300 mg q8 (Day 1)   Withdrawal: -Continue Seizure Precautions -Continue Ativan 1 mg q6 PRN CIWA>10 -Continue Imodium 2-4 mg PRN -Continue Robaxin 500 mg q8 PRN -Continue Naproxen 500 mg BID PRN -Continue Zofran-ODT 4 mg q6 PRN -Continue Thiamine 100 mg daily   Malnutrition: -Continue Ensure BID -Continue Multivitamin daily   Nicotine Dependence: -Continue Nicotine Patch 21 mg daily   Hypokalemia: -Start Kdur 20 mEq BID for 2 doses   -Continue Protonix 40 mg daily -Continue Florastor 250 mg BID   -Continue PRN's: Tylenol, Maalox, Atarax, Milk of Magnesia, Trazodone   Observation Level/Precautions:  15 minute  checks  Laboratory:  CMP- K: 3.2 (low)  AST: 55 (high)  CBC- RBC:4.06 (low) EtOH: 239 (high)   UDS- Cocaine, Meth, THC positive  Psychotherapy:    Medications:  Risperdal, Kdur  Consultations:    Discharge Concerns:    Estimated LOS:  Other:     Physician Treatment Plan for Primary Diagnosis: MDD (major depressive disorder), recurrent severe, without psychosis (HCC) Long Term Goal(s): Improvement in symptoms so as ready for discharge  Short Term Goals: Ability to identify changes in lifestyle to reduce recurrence of condition will improve, Ability to verbalize feelings will improve, Ability to disclose and discuss suicidal ideas, Ability to demonstrate self-control will improve,  Ability to identify and develop effective coping behaviors will improve, and Ability to identify triggers associated with substance abuse/mental health issues will improve  Physician Treatment Plan for Secondary Diagnosis: Principal Problem:   MDD (major depressive disorder), recurrent severe, without psychosis (HCC) Active Problems:   Polysubstance abuse (HCC)  Long Term Goal(s): Improvement in symptoms so as ready for discharge  Short Term Goals: Ability to identify changes in lifestyle to reduce recurrence of condition will improve, Ability to verbalize feelings will improve, Ability to disclose and discuss suicidal ideas, Ability to demonstrate self-control will improve, Ability to identify and develop effective coping behaviors will improve, and Ability to identify triggers associated with substance abuse/mental health issues will improve  I certify that inpatient services furnished can reasonably be expected to improve the patient's condition.    Lauro [facility], MD 8/13/20221:53 PM

## 2020-10-30 NOTE — Progress Notes (Signed)
   10/30/20 0400  Psych Admission Type (Psych Patients Only)  Admission Status Voluntary  Psychosocial Assessment  Patient Complaints Depression;Anxiety;Substance abuse;Sleep disturbance;Self-harm thoughts;Hopelessness;Helplessness;Anhedonia;Worthlessness  Eye Contact Fair  Facial Expression Sad  Affect Sad;Depressed  Speech Logical/coherent;Slurred  Interaction Assertive  Motor Activity Slow  Appearance/Hygiene Poor hygiene;Disheveled  Behavior Characteristics Cooperative;Appropriate to situation;Anxious  Mood Depressed;Anxious;Pleasant  Thought Process  Coherency WDL  Content Blaming self;Delusions  Delusions Paranoid  Perception Hallucinations  Hallucination Auditory (voices telling him he is worthless)  Judgment Impaired  Confusion None  Danger to Self  Current suicidal ideation? Passive  Self-Injurious Behavior No self-injurious ideation or behavior indicators observed or expressed   Agreement Not to Harm Self Yes  Description of Agreement verbal agreement to approach staff  Danger to Others  Danger to Others None reported or observed

## 2020-10-30 NOTE — [facility] Group Notes (Signed)
LCSW Group Therapy Note 10/30/2020  10:00-11:00am  Type of Therapy and Topic:  Group Therapy: Anger and Commonalities  Participation Level:  Did Not Attend   Description of Group: In this group, patients initially shared an "unknown" fact about themselves and CSW led a discussion about the ways in which we have things in common without realizing it.  Patient then identified a recent time they became angry and how this yet again showed a way in which they had something in common with other patients.  We discussed possible unhealthy reactions to anger and possible healthy reactions.  We also discussed possible underlying emotions that lead to the anger.  Commonalities among group members were pointed out throughout the entirety of group.  Therapeutic Goals: Patients were asked to share something about themselves and learned that they often have things in common with other people without knowing this Patients will remember their last incident of anger and how they reacted Patients will be able to identify their reaction as healthy or unhealthy, and identify possible reactions that would have been the opposite Patients will learn that anger itself is a secondary emotion and will think about their primary emotion at the time of their last incident of anger  Summary of Patient Progress:  The patient was invited to group, did not attend.  Therapeutic Modalities:   Cognitive Behavioral Therapy  Sagrario Lineberry J Grossman-Orr, LCSW 10/30/2020  9:44 AM    

## 2020-10-30 NOTE — Progress Notes (Signed)
Pt received PRN Ativan 1 mg and Zofran 4 mg both PO this shift at 1317 for active withdrawal symptoms and noon CIWAs score of 11. Pt observed to be irritable on interactions "I can't sleep around here, y'all keep needing stuff from me". Observed asleep most of this afternoon post PRN medications. Denies SI, HI, VH and pain. Endorsed +AH this evening during at medication window "The voices not telling me to hurt myself or anyone. They are just telling me that I'm worthless, stuff like that. I'm trying not to listen to them". Verbal education provided on new order for Risperdal. Emotional support offered to pt this shift. Writer encouraged pt to voice concerns to foster early intervention to meet safety needs; comply with current treatment regimen including scheduled unit groups. All medications given as ordered and effects monitored. EKG done and urine sample obtained as ordered. Vitals done, WNL and CIWA score this evening 3/10.  Q 15 minutes safety checks maintained without self harm gestures. Pt tolerated meals, fluids and medications well without discomfort. Pt did not attend groups, did came in as an early admission at approximately 0400 AM and in active withdrawals.

## 2020-10-30 NOTE — Tx Team (Signed)
Initial Treatment Plan 10/30/2020 5:39 AM Jerry [name] [id number]    PATIENT STRESSORS: Marital or family conflict Substance abuse   PATIENT STRENGTHS: Average or above average intelligence Motivation for treatment/growth   PATIENT IDENTIFIED PROBLEMS: Suicide attempt - drug overdose  Alcohol use d/o  Methamphetamine use d/o  Opiate use d/o  Cannabis use d/o  Psychosis  Anxiety  Depression  (Wants to get off drugs and alcohol for good and into rehab)     DISCHARGE CRITERIA:  Ability to meet basic life and health needs Adequate post-discharge living arrangements Improved stabilization in mood, thinking, and/or behavior Motivation to continue treatment in a less acute level of care Verbal commitment to aftercare and medication compliance  PRELIMINARY DISCHARGE PLAN: Attend aftercare/continuing care group Outpatient therapy Placement in alternative living arrangements  PATIENT/FAMILY INVOLVEMENT: This treatment plan has been presented to and reviewed with the patient, Jerry [name], and/or family member.  The patient and family have been given the opportunity to ask questions and make suggestions.  Victorino December, RN 10/30/2020, 5:39 AM

## 2020-10-30 NOTE — Plan of Care (Signed)
  Problem: Education: Goal: Emotional status will improve Outcome: Not Progressing Goal: Mental status will improve Outcome: Not Progressing   Problem: Activity: Goal: Interest or engagement in activities will improve Outcome: Not Progressing   

## 2020-10-30 NOTE — Progress Notes (Signed)
Psychoeducational Group Note  Date:  10/30/2020 Time:  2015  Group Topic/Focus:  Wrap up group  Participation Level: Did Not Attend  Participation Quality:  Not Applicable  Affect:  Not Applicable  Cognitive:  Not Applicable  Insight:  Not Applicable  Engagement in Group: Not Applicable  Additional Comments:  Did not attend.   Marcille Buffy 10/30/2020, 9:08 PM
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
## 2020-10-30 NOTE — Progress Notes (Signed)
  D:  Pt presents with high anxiety (8/10) and high depression (7/10).  Pt complains of bilateral shoulder pain (8/10) and mid chest soreness.  Provider notified.  Administered PRN Naproxen, PRN Vistaril, and PRN Trazodone per [facility] per pt request.  Pt denies SI/HI and verbally contracts for safety.  Pt denies AVH.  A:  Labs/Vitals monitored; Medication education provided; Pt encouraged to communicate concerns.  R:  Pt remains safe on unit with Q 15 minutes safety checks.    10/30/20 2142  Psych Admission Type (Psych Patients Only)  Admission Status Involuntary  Psychosocial Assessment  Patient Complaints Anxiety;Depression  Eye Contact Fair  Facial Expression Sad;Worried  Affect Appropriate to circumstance  Speech Logical/coherent;Slurred  Interaction Assertive  Motor Activity Slow  Appearance/Hygiene Poor hygiene;Disheveled  Behavior Characteristics Cooperative  Mood Depressed;Anxious  Thought Process  Coherency WDL  Content Blaming self;Delusions  Delusions Paranoid  Perception Hallucinations  Hallucination None reported or observed  Judgment Impaired  Confusion None  Danger to Self  Current suicidal ideation? Denies  Self-Injurious Behavior No self-injurious ideation or behavior indicators observed or expressed   Agreement Not to Harm Self Yes  Description of Agreement Pt verbally contracts for safety  Danger to Others  Danger to Others None reported or observed

## 2020-10-30 NOTE — Progress Notes (Signed)
Patient ID: Jerry [name], male   DOB: 1976-03-07, 45 y.o.   MRN: [id number]  D: Pt here voluntarily from [facility]. Pt denies HI, VH at this time. Pt endorses SI but contracts for safety while at [facility]. Pt endorses AH that tell him, "you're a worthless piece of shit." Pt intentionally overdosed on heroin and methamphetamines. "I was tired of losing my family because of drugs and alcohol. I tried to use Ativan to stop drinking on my own but you can't do it on your own. I couldn't do it. I need some help."    Pt has been homeless for the last year, living in hotels. For the last week, he has been living outdoors. Pt drinks 1/2 gallon Vodka daily and last used meth, marijuana, heroin, cocaine on 10/28/20. Pt endorses nausea, sweats, headache, anxiety and agitation. Pt also endorses history of seizures from alcohol withdrawal, with last one being 3 years ago. Pt wants to get into rehab and stop drinking and using drugs.   Pt says he has a hard time getting his medications d/t insurance issues. Pt has been off his medication since last admit to [facility] in April 2022. Pt has history of multiple falls and is blind in the left eye. Pt endorses weakness in arms and legs and is a high fall risk.   A: Pt was offered support and encouragement. Pt is cooperative during assessment. VS assessed and admission paperwork signed. Belongings searched and contraband items placed in locker. Non-invasive skin search completed: tattoos on left arm, abrasions on bilateral arms, legs and left knee; possible cellulitis on top of right foot where pt was using IV drugs. Pt offered food and drink and drink accepted. Pt introduced to unit milieu by nursing staff. Q 15 minute checks were started for safety.   R: Pt at med window getting medication for withdrawal symptoms. Pt safety maintained on unit.

## 2020-10-31 LAB — LIPID PANEL
Cholesterol: 152 mg/dL (ref 0–200)
HDL: 48 mg/dL (ref >40)
LDL Cholesterol: 82 mg/dL (ref 0–99)
Total CHOL/HDL Ratio: 3.2 RATIO
Triglycerides: 109 mg/dL (ref <150)
VLDL: 22 mg/dL (ref 0–40)

## 2020-10-31 LAB — COMPREHENSIVE METABOLIC PANEL
ALT: 36 U/L (ref 0–44)
AST: 54 U/L — ABNORMAL HIGH (ref 15–41)
Albumin: 3.1 g/dL — ABNORMAL LOW (ref 3.5–5.0)
Alkaline Phosphatase: 68 U/L (ref 38–126)
Anion gap: 7 (ref 5–15)
BUN: 8 mg/dL (ref 6–20)
CO2: 27 mmol/L (ref 22–32)
Calcium: 9.2 mg/dL (ref 8.9–10.3)
Chloride: 100 mmol/L (ref 98–111)
Creatinine, Ser: 0.91 mg/dL (ref 0.61–1.24)
GFR, Estimated: >60 mL/min (ref >60)
Glucose, Bld: 136 mg/dL — ABNORMAL HIGH (ref 70–99)
Potassium: 4.3 mmol/L (ref 3.5–5.1)
Sodium: 134 mmol/L — ABNORMAL LOW (ref 135–145)
Total Bilirubin: 0.6 mg/dL (ref 0.3–1.2)
Total Protein: 8.8 g/dL — ABNORMAL HIGH (ref 6.5–8.1)

## 2020-10-31 LAB — URINALYSIS, COMPLETE (UACMP) WITH MICROSCOPIC
Bilirubin Urine: NEGATIVE
Glucose, UA: NEGATIVE mg/dL
Ketones, ur: NEGATIVE mg/dL
Leukocytes,Ua: NEGATIVE
Nitrite: NEGATIVE
Protein, ur: NEGATIVE mg/dL
Specific Gravity, Urine: 1.016 (ref 1.005–1.030)
pH: 6 (ref 5.0–8.0)

## 2020-10-31 LAB — HEPATITIS PANEL, ACUTE
HCV Ab: REACTIVE — AB
Hep A IgM: NONREACTIVE
Hep B C IgM: NONREACTIVE
Hepatitis B Surface Ag: NONREACTIVE

## 2020-10-31 MED ORDER — RISPERIDONE 1 MG PO TBDP
1.0000 mg | ORAL_TABLET | ORAL | Status: AC
  Administered 2020-10-31: 1 mg via ORAL
  Filled 2020-10-31: qty 1

## 2020-10-31 MED ORDER — RISPERIDONE 1 MG PO TABS
1.0000 mg | ORAL_TABLET | Freq: Every day | ORAL | Status: DC
  Administered 2020-11-01 – 2020-11-04 (×4): 1 mg via ORAL
  Filled 2020-10-31 (×2): qty 1
  Filled 2020-10-31: qty 21
  Filled 2020-10-31 (×3): qty 1

## 2020-10-31 MED ORDER — RISPERIDONE 0.5 MG PO TBDP
ORAL_TABLET | ORAL | Status: AC
  Filled 2020-10-31: qty 1

## 2020-10-31 MED ORDER — GUAIFENESIN ER 600 MG PO TB12
600.0000 mg | ORAL_TABLET | Freq: Two times a day (BID) | ORAL | Status: DC
  Administered 2020-10-31 – 2020-11-04 (×8): 600 mg via ORAL
  Filled 2020-10-31 (×12): qty 1

## 2020-10-31 MED ORDER — ALBUTEROL SULFATE (2.5 MG/3ML) 0.083% IN NEBU
2.5000 mg | INHALATION_SOLUTION | Freq: Four times a day (QID) | RESPIRATORY_TRACT | Status: DC | PRN
  Administered 2020-11-01 – 2020-11-02 (×2): 2.5 mg via RESPIRATORY_TRACT
  Filled 2020-10-31 (×2): qty 3

## 2020-10-31 MED ORDER — RISPERIDONE 2 MG PO TABS
2.0000 mg | ORAL_TABLET | Freq: Every day | ORAL | Status: DC
  Administered 2020-10-31 – 2020-11-03 (×4): 2 mg via ORAL
  Filled 2020-10-31 (×6): qty 1
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
## 2020-10-31 NOTE — [facility] Group Notes (Signed)
Adult Psychoeducational Group Note  Date:  10/31/2020 Time:  8:40 PM  Group Topic/Focus:  Healthy Communication:   The focus of this group is to discuss communication, barriers to communication, as well as healthy ways to communicate with others.  Participation Level:  Minimal  Participation Quality:  Appropriate  Affect:  Appropriate  Cognitive:  Appropriate  Insight: Good  Engagement in Group:  Engaged  Modes of Intervention:  Discussion  Additional Comments Jacalyn Lefevre 10/31/2020, 8:40 PM

## 2020-10-31 NOTE — [facility] Suicide Risk Assessment (Signed)
[facility] INPATIENT:  Family/Significant Other Suicide Prevention Education  Suicide Prevention Education:  Patient Refusal for Family/Significant Other Suicide Prevention Education: The patient Jerry [name] has refused to provide written consent for family/significant other to be provided Family/Significant Other Suicide Prevention Education during admission and/or prior to discharge.  Physician notified.  SPE completed with pt, as pt refused to consent to family contact. SPI pamphlet provided to pt and pt was encouraged to share information with support network, ask questions, and talk about any concerns relating to SPE. Pt denies access to guns/firearms and verbalized understanding of information provided. Mobile Crisis information also provided to pt.    Rona Ravens, MSW, LCSW 10/31/2020, 11:13 AM

## 2020-10-31 NOTE — Progress Notes (Signed)
[facility] MD Progress Note  10/31/2020 9:37 AM SHOTA KOHRS  MRN:  [id number] Subjective:   Jerry [name] is a 45 yr old male who presents with SI and command hallucinations. PPHx is significant for Schizophrenia, PTSD, and polysubstance abuse (EtOH, Meth, Cocaine, and THC), last admission [facility] on 6/2.   He reports that he is doing "okay."  He reports that his sleep has not been too good because he continues to be woken up and interrupted frequently.  He reports that his appetite has been fine.  He reports he has not had any nausea and that his sweating has improved somewhat.  He reports no more dry heaving that he was having yesterday.  He reports that his tremors have improved.  He reports that he still has SI and auditory hallucinations telling him to hurt himself.  He reports no HI or visual hallucinations.  He reports that the phlebotomist this morning was unable to get blood from him though it was attempted twice.  He reports that the phlebotomist said that the evening phlebotomist would be able to get the blood work.  He reports that he still has some mild chest pain but his cough has improved.  Discussed that this should continue to improve with the clindamycin as his potential pneumonia improves.  He reports no issues with his medication.  He reports continued interest in residential rehab.  He has no other concerns at present.   Principal Problem: MDD (major depressive disorder), recurrent severe, without psychosis (HCC) Diagnosis: Principal Problem:   MDD (major depressive disorder), recurrent severe, without psychosis (HCC) Active Problems:   Polysubstance abuse (HCC)  Total Time spent with patient: 30 minutes  Past Psychiatric History: Schizophrenia, PTSD, and polysubstance abuse (EtOH, Meth, Cocaine, and THC), last admission [facility] on 6/2.  Past Medical History:  Past Medical History:  Diagnosis Date   Anxiety    Bipolar 1 disorder (HCC)    Depression    Hallucination    Paranoid  schizophrenia (HCC)    since pt's 20's   PTSD (post-traumatic stress disorder)     Past Surgical History:  Procedure Laterality Date   KIDNEY DONATION Right 2003   Family History:  Family History  Problem Relation Age of Onset   Heart disease Mother    Cancer Mother    Heart disease Father    Diabetes Father    Diabetes Maternal Grandmother    Stroke Paternal Grandfather    Family Psychiatric  History: Mother- Some Disorder Aunt- Bipolar Disorder, Suicide Father- EtOH abuse Both sides of family- EtOH abuse Social History:  Social History   Substance and Sexual Activity  Alcohol Use Yes   Alcohol/week: 32.0 standard drinks   Types: 12 Cans of beer, 20 Shots of liquor per week   Comment: last use 10/28/20- drinks 1/2 gallon vodka daily     Social History   Substance and Sexual Activity  Drug Use Yes   Types: Marijuana, Cocaine, Heroin, Amphetamines, Methamphetamines   Comment: history of IVDA; last use 10/28/20    Social History   Socioeconomic History   Marital status: Married    Spouse name: Not on file   Number of children: Not on file   Years of education: Not on file   Highest education level: Not on file  Occupational History   Not on file  Tobacco Use   Smoking status: Every Day    Packs/day: 2.00    Types: Cigarettes   Smokeless tobacco: Never  Vaping Use  Vaping Use: Never used  Substance and Sexual Activity   Alcohol use: Yes    Alcohol/week: 32.0 standard drinks    Types: 12 Cans of beer, 20 Shots of liquor per week    Comment: last use 10/28/20- drinks 1/2 gallon vodka daily   Drug use: Yes    Types: Marijuana, Cocaine, Heroin, Amphetamines, Methamphetamines    Comment: history of IVDA; last use 10/28/20   Sexual activity: Yes  Other Topics Concern   Not on file  Social History Narrative   Pt has been homeless for the past year and living outdoors for the past week; previously he has been living in hotels. Separated from wife. No family  support.   Social Determinants of Health   Financial Resource Strain: Not on file  Food Insecurity: Not on file  Transportation Needs: Not on file  Physical Activity: Not on file  Stress: Not on file  Social Connections: Not on file   Additional Social History:                         Sleep: Fair  Appetite:  Good  Current Medications: Current Facility-Administered Medications  Medication Dose Route Frequency Provider Last Rate Last Admin   acetaminophen (TYLENOL) tablet 650 mg  650 mg Oral Q6H PRN Ajibola, Ene A, NP   650 mg at 10/31/20 0621   alum & mag hydroxide-simeth (MAALOX/MYLANTA) 200-200-20 MG/5ML suspension 30 mL  30 mL Oral Q4H PRN Ajibola, Ene A, NP       clindamycin (CLEOCIN) capsule 300 mg  300 mg Oral Q8H Antonieta Pert, MD   300 mg at 10/31/20 0617   dicyclomine (BENTYL) tablet 20 mg  20 mg Oral Q6H PRN Ajibola, Ene A, NP       feeding supplement (ENSURE ENLIVE / ENSURE PLUS) liquid 237 mL  237 mL Oral BID BM Ajibola, Ene A, NP   237 mL at 10/30/20 1437   hydrOXYzine (ATARAX/VISTARIL) tablet 25 mg  25 mg Oral Q6H PRN Ajibola, Ene A, NP   25 mg at 10/31/20 2355   loperamide (IMODIUM) capsule 2-4 mg  2-4 mg Oral PRN Ajibola, Ene A, NP       LORazepam (ATIVAN) tablet 1 mg  1 mg Oral Q6H PRN Ajibola, Ene A, NP   1 mg at 10/30/20 1317   magnesium hydroxide (MILK OF MAGNESIA) suspension 30 mL  30 mL Oral Daily PRN Ajibola, Ene A, NP       methocarbamol (ROBAXIN) tablet 500 mg  500 mg Oral Q8H PRN Ajibola, Ene A, NP   500 mg at 10/31/20 7322   multivitamin with minerals tablet 1 tablet  1 tablet Oral Daily Ajibola, Ene A, NP   1 tablet at 10/31/20 0812   naproxen (NAPROSYN) tablet 500 mg  500 mg Oral BID PRN Ajibola, Ene A, NP   500 mg at 10/30/20 2142   nicotine (NICODERM CQ - dosed in mg/24 hours) patch 21 mg  21 mg Transdermal Daily Ajibola, Ene A, NP   21 mg at 10/31/20 0812   ondansetron (ZOFRAN-ODT) disintegrating tablet 4 mg  4 mg Oral Q6H PRN Ajibola,  Ene A, NP   4 mg at 10/30/20 1317   pantoprazole (PROTONIX) EC tablet 40 mg  40 mg Oral Daily Antonieta Pert, MD   40 mg at 10/31/20 0254   risperiDONE (RISPERDAL) tablet 0.5 mg  0.5 mg Oral Daily Lauro Franklin, MD   0.5 mg  at 10/31/20 0812   risperiDONE (RISPERDAL) tablet 1 mg  1 mg Oral QHS Lauro Franklin, MD   1 mg at 10/30/20 2140   saccharomyces boulardii (FLORASTOR) capsule 250 mg  250 mg Oral BID Antonieta Pert, MD   250 mg at 10/31/20 1610   thiamine tablet 100 mg  100 mg Oral Daily Ajibola, Ene A, NP   100 mg at 10/31/20 9604   traZODone (DESYREL) tablet 50 mg  50 mg Oral QHS PRN Ajibola, Ene A, NP   50 mg at 10/30/20 2141    Lab Results:  Results for orders placed or performed during the hospital encounter of 10/29/20 (from the past 48 hour(s))  CBC WITH DIFFERENTIAL     Status: Abnormal   Collection Time: 10/29/20  6:20 PM  Result Value Ref Range   WBC 4.9 4.0 - 10.5 K/uL   RBC 4.06 (L) 4.22 - 5.81 MIL/uL   Hemoglobin 13.3 13.0 - 17.0 g/dL   HCT 54.0 98.1 - 19.1 %   MCV 99.8 80.0 - 100.0 fL   MCH 32.8 26.0 - 34.0 pg   MCHC 32.8 30.0 - 36.0 g/dL   RDW 47.8 29.5 - 62.1 %   Platelets 150 150 - 400 K/uL   nRBC 0.0 0.0 - 0.2 %   Neutrophils Relative % 39 %   Neutro Abs 1.9 1.7 - 7.7 K/uL   Lymphocytes Relative 39 %   Lymphs Abs 1.9 0.7 - 4.0 K/uL   Monocytes Relative 15 %   Monocytes Absolute 0.7 0.1 - 1.0 K/uL   Eosinophils Relative 5 %   Eosinophils Absolute 0.3 0.0 - 0.5 K/uL   Basophils Relative 1 %   Basophils Absolute 0.1 0.0 - 0.1 K/uL   Immature Granulocytes 1 %   Abs Immature Granulocytes 0.03 0.00 - 0.07 K/uL    Comment: Performed at [facility], [address], [place], [place] [address]  Resp Panel by RT-PCR (Flu A&B, Covid) Nasopharyngeal Swab     Status: None   Collection Time: 10/29/20  6:38 PM   Specimen: Nasopharyngeal Swab; Nasopharyngeal(NP) swabs in vial transport medium  Result Value Ref Range   SARS  Coronavirus 2 by RT PCR NEGATIVE NEGATIVE    Comment: (NOTE) SARS-CoV-2 target nucleic acids are NOT DETECTED.  The SARS-CoV-2 RNA is generally detectable in upper respiratory specimens during the acute phase of infection. The lowest concentration of SARS-CoV-2 viral copies this assay can detect is 138 copies/mL. A negative result does not preclude SARS-Cov-2 infection and should not be used as the sole basis for treatment or other patient management decisions. A negative result may occur with  improper specimen collection/handling, submission of specimen other than nasopharyngeal swab, presence of viral mutation(s) within the areas targeted by this assay, and inadequate number of viral copies(<138 copies/mL). A negative result must be combined with clinical observations, patient history, and epidemiological information. The expected result is Negative.  Fact Sheet for Patients:  BloggerCourse.com  Fact Sheet for Healthcare Providers:  SeriousBroker.it  This test is no t yet approved or cleared by the [place] FDA and  has been authorized for detection and/or diagnosis of SARS-CoV-2 by FDA under an Emergency Use Authorization (EUA). This EUA will remain  in effect (meaning this test can be used) for the duration of the COVID-19 declaration under Section 564(b)(1) of the Act, 21 U.S.C.section 360bbb-3(b)(1), unless the authorization is terminated  or revoked sooner.       Influenza A by  PCR NEGATIVE NEGATIVE   Influenza B by PCR NEGATIVE NEGATIVE    Comment: (NOTE) The Xpert Xpress SARS-CoV-2/FLU/RSV plus assay is intended as an aid in the diagnosis of influenza from Nasopharyngeal swab specimens and should not be used as a sole basis for treatment. Nasal washings and aspirates are unacceptable for Xpert Xpress SARS-CoV-2/FLU/RSV testing.  Fact Sheet for Patients: BloggerCourse.com  Fact Sheet  for Healthcare Providers: SeriousBroker.it  This test is not yet approved or cleared by the [place] FDA and has been authorized for detection and/or diagnosis of SARS-CoV-2 by FDA under an Emergency Use Authorization (EUA). This EUA will remain in effect (meaning this test can be used) for the duration of the COVID-19 declaration under Section 564(b)(1) of the Act, 21 U.S.C. section 360bbb-3(b)(1), unless the authorization is terminated or revoked.  Performed at [facility], [address], [place], [place] [address]   Salicylate level     Status: Abnormal   Collection Time: 10/29/20  6:39 PM  Result Value Ref Range   Salicylate Lvl <7.0 (L) 7.0 - 30.0 mg/dL    Comment: Performed at [facility], [address], [place], [place] [address]  Acetaminophen level     Status: Abnormal   Collection Time: 10/29/20  6:39 PM  Result Value Ref Range   Acetaminophen (Tylenol), Serum <10 (L) 10 - 30 ug/mL    Comment: (NOTE) Therapeutic concentrations vary significantly. A range of 10-30 ug/mL  may be an effective concentration for many patients. However, some  are best treated at concentrations outside of this range. Acetaminophen concentrations >150 ug/mL at 4 hours after ingestion  and >50 ug/mL at 12 hours after ingestion are often associated with  toxic reactions.  Performed at [facility], [address] [place], [place] [address]   Ethanol     Status: Abnormal   Collection Time: 10/29/20  6:39 PM  Result Value Ref Range   Alcohol, Ethyl (B) 239 (H) <10 mg/dL    Comment: (NOTE) Lowest detectable limit for serum alcohol is 10 mg/dL.  For medical purposes only. Performed at [facility], [address] [place], [place] [address]   Comprehensive metabolic panel     Status: Abnormal   Collection Time: 10/29/20  6:58 PM  Result Value Ref Range   Sodium 139 135 - 145  mmol/L   Potassium 3.2 (L) 3.5 - 5.1 mmol/L   Chloride 104 98 - 111 mmol/L   CO2 26 22 - 32 mmol/L   Glucose, Bld 88 70 - 99 mg/dL    Comment: Glucose reference range applies only to samples taken after fasting for at least 8 hours.   BUN 7 6 - 20 mg/dL   Creatinine, Ser 7.56 0.61 - 1.24 mg/dL   Calcium 8.8 (L) 8.9 - 10.3 mg/dL   Total Protein 8.9 (H) 6.5 - 8.1 g/dL   Albumin 3.2 (L) 3.5 - 5.0 g/dL   AST 55 (H) 15 - 41 U/L   ALT 34 0 - 44 U/L   Alkaline Phosphatase 62 38 - 126 U/L   Total Bilirubin 0.5 0.3 - 1.2 mg/dL   GFR, Estimated >43 >32 mL/min    Comment: (NOTE) Calculated using the CKD-EPI Creatinine Equation (2021)    Anion gap 9 5 - 15    Comment: Performed at [facility], [address] [place], [place] [address]  CBG monitoring, ED     Status: None   Collection Time: 10/29/20  7:03 PM  Result Value Ref Range   Glucose-Capillary 93 70 - 99 mg/dL    Comment: Glucose reference range applies only to samples taken after fasting for at least 8 hours.  Urine rapid drug screen (hosp performed)     Status: Abnormal   Collection Time: 10/30/20  3:16 AM  Result Value Ref Range   Opiates NONE DETECTED NONE DETECTED   Cocaine POSITIVE (A) NONE DETECTED   Benzodiazepines NONE DETECTED NONE DETECTED   Amphetamines POSITIVE (A) NONE DETECTED   Tetrahydrocannabinol POSITIVE (A) NONE DETECTED   Barbiturates NONE DETECTED NONE DETECTED    Comment: (NOTE) DRUG SCREEN FOR MEDICAL PURPOSES ONLY.  IF CONFIRMATION IS NEEDED FOR ANY PURPOSE, NOTIFY LAB WITHIN 5 DAYS.  LOWEST DETECTABLE LIMITS FOR URINE DRUG SCREEN Drug Class                     Cutoff (ng/mL) Amphetamine and metabolites    1000 Barbiturate and metabolites    200 Benzodiazepine                 200 Tricyclics and metabolites     300 Opiates and metabolites        300 Cocaine and metabolites        300 THC                            50 Performed at [facility], [address] [place], [place] [address]     Blood Alcohol level:  Lab Results  Component Value Date   ETH 239 (H) 10/29/2020   ETH 18 (H) 10/25/2020    Metabolic Disorder Labs: Lab Results  Component Value Date   HGBA1C 5.3 07/17/2020   MPG 105.41 07/17/2020   MPG 108 06/25/2014   No results found for: PROLACTIN Lab Results  Component Value Date   CHOL 173 07/17/2020   TRIG 106 07/17/2020   HDL 52 07/17/2020   CHOLHDL 3.3 07/17/2020   VLDL 21 07/17/2020   LDLCALC 100 (H) 07/17/2020    Physical Findings: AIMS:  , ,  ,  ,    CIWA:  CIWA-Ar Total: 8 COWS:  COWS Total Score: 6  Musculoskeletal: Strength & Muscle Tone: decreased Gait & Station: normal Patient leans: N/A  Psychiatric Specialty Exam:  Presentation  General Appearance: Disheveled (diaphoretic)  Eye Contact:Good  Speech:Clear and Coherent; Slow  Speech Volume:Normal  Handedness:Right   Mood and Affect  Mood:Depressed  Affect:Depressed   Thought Process  Thought Processes:Coherent  Descriptions of Associations:Intact  Orientation:Full (Time, Place and Person)  Thought Content:Logical; WDL  History of Schizophrenia/Schizoaffective disorder:Yes  Duration of Psychotic Symptoms:Greater than six months  Hallucinations:Hallucinations: Auditory Description of Auditory Hallucinations: voices telling him to hurt himself  Ideas of Reference:Paranoia  Suicidal Thoughts:Suicidal Thoughts: Yes, Passive  Homicidal Thoughts:Homicidal Thoughts: No   Sensorium  Memory:Immediate Fair; Recent Fair  Judgment:Poor  Insight:Poor   Executive Functions  Concentration:Good  Attention Span:Good  Recall:Good  Fund of Knowledge:Good  Language:Good   Psychomotor Activity  Psychomotor Activity:Psychomotor Activity: Normal   Assets  Assets:Desire for Improvement; Resilience   Sleep  Sleep:Sleep: Fair Number of Hours of Sleep: 5.5    Physical Exam: Physical Exam Vitals and  nursing note reviewed.  Constitutional:      General: He is not in acute distress.    Appearance: Normal appearance. He is normal weight. He is ill-appearing and diaphoretic (improved). He is not  toxic-appearing.  HENT:     Head: Normocephalic and atraumatic.  Chest:     Chest wall: Tenderness present.  Musculoskeletal:        General: Normal range of motion.  Neurological:     General: No focal deficit present.     Mental Status: He is alert.   Review of Systems  Respiratory:  Positive for shortness of breath. Negative for cough.   Cardiovascular:  Positive for chest pain.  Gastrointestinal:  Negative for abdominal pain, constipation, diarrhea, nausea and vomiting.  Neurological:  Positive for headaches. Negative for weakness.  Psychiatric/Behavioral:  Positive for hallucinations and suicidal ideas. Negative for depression. The patient is not nervous/anxious.   Blood pressure 120/83, pulse 85, temperature 98 F (36.7 C), temperature source Oral, resp. rate 16, height 5\' 9"  (1.753 m), weight 74.4 kg, SpO2 100 %. Body mass index is 24.22 kg/m.   Treatment Plan Summary: Daily contact with patient to assess and evaluate symptoms and progress in treatment   Jerry [name] is a 45 yr old male who presents with SI and command hallucinations. PPHx is significant for Schizophrenia, PTSD, and polysubstance abuse (EtOH, Meth, Cocaine, and THC), last admission [facility] on 6/2.     His withdrawal seems to be improving, CIWA yesterday had values of 12 and 11 requiring Ativan however this morning his CIWA has been 7 and 8 also his physical symptoms are slightly improving.  So far he is tolerated restarting the Risperdal we will continue to monitor.  He did not get blood work this morning because the phlebotomist was unable to get it.  Hopefully the phlebotomist this evening will be able to.  Will await lab results.  Will not make any medication changes at this time.  We will continue to monitor.      Schizophrenia Vs Substance Induced Thought Disorder: -Continue Risperdal 0.5 mg AM and 1 mg QHS     Possible Right Pneumonia: -Continue Clindamycin 300 mg q8 (Day 2)     Withdrawal: -Continue Seizure Precautions -Continue Ativan 1 mg q6 PRN CIWA>10 -Continue Imodium 2-4 mg PRN -Continue Robaxin 500 mg q8 PRN -Continue Naproxen 500 mg BID PRN -Continue Zofran-ODT 4 mg q6 PRN -Continue Thiamine 100 mg daily     Malnutrition: -Continue Ensure BID -Continue Multivitamin daily     Nicotine Dependence: -Continue Nicotine Patch 21 mg daily     Hypokalemia: -Gave Kdur 20 mEq BID for 2 doses yesterday -Recheck CMP tomorrow AM     -Continue Protonix 40 mg daily -Continue Florastor 250 mg BID     -Continue PRN's: Tylenol, Maalox, Atarax, Milk of Magnesia, Trazodone    Lauro FranklinAlexander S Yul Diana, MD 10/31/2020, 9:37 AM

## 2020-10-31 NOTE — [facility] Group Notes (Signed)
[facility] LCSW Group Therapy Note  10/31/2020    Type of Therapy and Topic:  Group Therapy:  Adding Supports Including Yourself  Participation Level:  Active   Description of Group:   Patients in this group were introduced to the concept that additional supports including self-support are an essential part of recovery.  Patients listed their current healthy and unhealthy supports, and discussed the difference between the two.   Several songs were played and a group discussion ensued in which patients stated they could relate to the songs which inspired them to realize they have be willing to help themselves in order to succeed, because other people cannot achieve sobriety or stability for them.  Parents were encouraged toward self-advocacy and self-support as part of their recovery.  They discussed their reactions to these songs' messages, which were positive and hopeful.  Before group ended, they identified the supports they believe they need to add to their lives to achieve their goals at discharge.   Therapeutic Goals: 1)  explain the difference between healthy and unhealthy supports and discuss what specific supports are currently in patients' lives 2)  demonstrate the importance of being a key part of one's own support system 3)  discuss the need for appropriate boundaries with supports 4)  elicit ideas from patients about supports that need to be added in order to achieve goals   Summary of Patient Progress:   The patient expressed that supports he needs to add at discharge include "I don't know, I don't need anybody but me.  Can't trust nobody."  He spoke frequently during the early part of group, saying he does not have friends because they have always proven themselves to be unsupportive.  He left group before the end, did not return.    Therapeutic Modalities:   Motivational Interviewing Activity  Lynnell Chad

## 2020-10-31 NOTE — Progress Notes (Signed)
Pt's mood has been labile, blunted affect, pressured speech and is demanding on interactions this shift. He's been very demanding, verbally hostile and oppositional and resistive towards care this morning and afternoon when demands does not go his way. Pt required multiple verbal redirections to take his medications this afternoon and drink fluids because he was agitated about staff going into his room at Q 15 minutes intervals for safety checks "I can't even damn sleep in here. Move me to that fucking hall right now if I can't even sleep or get gatorade, I'm not drinking that damn city water like that. I need to rest I'm not going to group. If y'all can't send me to rehab, damn let me go too". Speech is pressured, loud and hostile at nursing station when verbal redirections was attempted with pt. Pt received Risperdal 1 mg PO at at 1428 once as he continues to escalate in his behavior. Pt tolerates all meals and medications well.  Support and encouragement offered. Safety checks maintained without self harm gestures or outburst. All medications given with verbal education and effects monitored.  Pt was calmer when reassessed at 1525 post NOW dose of Risperdal. He was more cooperative with care. Safety maintained.

## 2020-11-01 LAB — HEMOGLOBIN A1C
Hgb A1c MFr Bld: 5.5 % (ref 4.8–5.6)
Mean Plasma Glucose: 111.15 mg/dL

## 2020-11-01 NOTE — Progress Notes (Signed)
Spiritual care group on grief and loss facilitated by chaplain Burnis Kingfisher MDiv, [facility]  Group Goal:  Support / Education around grief and loss Members engage in facilitated group support and psycho-social education.  Group Description:  Following introductions and group rules, group members engaged in facilitated group dialog and support around topic of loss, with particular support around experiences of loss in their lives. Group Identified types of loss (relationships / self / things) and identified patterns, circumstances, and changes that precipitate losses. Reflected on thoughts / feelings around loss, normalized grief responses, and recognized variety in grief experience.   Group noted Worden's four tasks of grief in discussion.  Group drew on Adlerian / Rogerian, narrative, MI, Patient Progress: Invited to group.  Did not attend.

## 2020-11-01 NOTE — [facility] Group Notes (Signed)
[facility] LCSW Group Therapy  11/01/2020 1:58 PM  Type of Therapy:  Group Therapy: All About Me   Participation Level:  Minimal  Summary of Progress/Problems: Pt attended group but did not participate unless prompted.   Felizardo Hoffmann 11/01/2020, 1:58 PM

## 2020-11-01 NOTE — Progress Notes (Signed)
[facility] MD Progress Note  11/01/2020 11:34 AM Jerry [name]  MRN:  [id number] Subjective:   Jerry [name] is a 45 yr old male who presents with SI and command hallucinations. PPHx is significant for Schizophrenia, PTSD, and polysubstance abuse (EtOH, Meth, Cocaine, and THC), last admission [facility] on 6/2.   Patient reports that he is doing okay today.  Still complaining of being frequently disturbed with safety checks.  Reports that his sleep is only okay again due to the frequent safety checks.  Reports that his appetite is improving.  He reports that currently no SI, HI, or AVH.  He reports that while he still has some chest pain and shortness of breath they are mild and improving.  Discussed with him that I would be ordering a nebulizer treatment to try to break up mucus and improve his overall breathing.  He reports no issues with any of his medications.  He reports that his withdrawal symptoms are improving.  He reports his sweating is improved and his tremor has also decreased.  He reports he still has some nausea and occasionally mild dry heaving but this is improved since admission.  He inquired about the results of his HIV blood work, discussed with him that these have not resulted yet but as soon as I did I would discuss results with him.  He reports no other concerns at present.   Principal Problem: MDD (major depressive disorder), recurrent severe, without psychosis (HCC) Diagnosis: Principal Problem:   MDD (major depressive disorder), recurrent severe, without psychosis (HCC) Active Problems:   Polysubstance abuse (HCC)  Total Time spent with patient: 30 minutes  Past Psychiatric History: Schizophrenia, PTSD, and polysubstance abuse (EtOH, Meth, Cocaine, and THC), last admission [facility] on 6/2.  Past Medical History:  Past Medical History:  Diagnosis Date   Anxiety    Bipolar 1 disorder (HCC)    Depression    Hallucination    Paranoid schizophrenia (HCC)    since pt's 20's   PTSD  (post-traumatic stress disorder)     Past Surgical History:  Procedure Laterality Date   KIDNEY DONATION Right 2003   Family History:  Family History  Problem Relation Age of Onset   Heart disease Mother    Cancer Mother    Heart disease Father    Diabetes Father    Diabetes Maternal Grandmother    Stroke Paternal Grandfather    Family Psychiatric  History: Mother- Some Disorder Aunt- Bipolar Disorder, Suicide Father- EtOH abuse Both sides of family- EtOH abuse Social History:  Social History   Substance and Sexual Activity  Alcohol Use Yes   Alcohol/week: 32.0 standard drinks   Types: 12 Cans of beer, 20 Shots of liquor per week   Comment: last use 10/28/20- drinks 1/2 gallon vodka daily     Social History   Substance and Sexual Activity  Drug Use Yes   Types: Marijuana, Cocaine, Heroin, Amphetamines, Methamphetamines   Comment: history of IVDA; last use 10/28/20    Social History   Socioeconomic History   Marital status: Married    Spouse name: Not on file   Number of children: Not on file   Years of education: Not on file   Highest education level: Not on file  Occupational History   Not on file  Tobacco Use   Smoking status: Every Day    Packs/day: 2.00    Types: Cigarettes   Smokeless tobacco: Never  Vaping Use   Vaping Use: Never used  Substance  and Sexual Activity   Alcohol use: Yes    Alcohol/week: 32.0 standard drinks    Types: 12 Cans of beer, 20 Shots of liquor per week    Comment: last use 10/28/20- drinks 1/2 gallon vodka daily   Drug use: Yes    Types: Marijuana, Cocaine, Heroin, Amphetamines, Methamphetamines    Comment: history of IVDA; last use 10/28/20   Sexual activity: Yes  Other Topics Concern   Not on file  Social History Narrative   Pt has been homeless for the past year and living outdoors for the past week; previously he has been living in hotels. Separated from wife. No family support.   Social Determinants of Health    Financial Resource Strain: Not on file  Food Insecurity: Not on file  Transportation Needs: Not on file  Physical Activity: Not on file  Stress: Not on file  Social Connections: Not on file   Additional Social History:                         Sleep: Fair  Appetite:  Good  Current Medications: Current Facility-Administered Medications  Medication Dose Route Frequency Provider Last Rate Last Admin   acetaminophen (TYLENOL) tablet 650 mg  650 mg Oral Q6H PRN Ajibola, Ene A, NP   650 mg at 10/31/20 0621   albuterol (PROVENTIL) (2.5 MG/3ML) 0.083% nebulizer solution 2.5 mg  2.5 mg Nebulization Q6H PRN Lauro Franklin, MD   2.5 mg at 11/01/20 0932   alum & mag hydroxide-simeth (MAALOX/MYLANTA) 200-200-20 MG/5ML suspension 30 mL  30 mL Oral Q4H PRN Ajibola, Ene A, NP       clindamycin (CLEOCIN) capsule 300 mg  300 mg Oral Q8H Antonieta Pert, MD   300 mg at 11/01/20 0610   dicyclomine (BENTYL) tablet 20 mg  20 mg Oral Q6H PRN Ajibola, Ene A, NP   20 mg at 10/31/20 2034   feeding supplement (ENSURE ENLIVE / ENSURE PLUS) liquid 237 mL  237 mL Oral BID BM Ajibola, Ene A, NP   237 mL at 10/31/20 1426   guaiFENesin (MUCINEX) 12 hr tablet 600 mg  600 mg Oral BID Lauro Franklin, MD   600 mg at 11/01/20 0759   hydrOXYzine (ATARAX/VISTARIL) tablet 25 mg  25 mg Oral Q6H PRN Ajibola, Ene A, NP   25 mg at 10/31/20 1357   loperamide (IMODIUM) capsule 2-4 mg  2-4 mg Oral PRN Ajibola, Ene A, NP       LORazepam (ATIVAN) tablet 1 mg  1 mg Oral Q6H PRN Ajibola, Ene A, NP   1 mg at 10/31/20 2034   magnesium hydroxide (MILK OF MAGNESIA) suspension 30 mL  30 mL Oral Daily PRN Ajibola, Ene A, NP       methocarbamol (ROBAXIN) tablet 500 mg  500 mg Oral Q8H PRN Ajibola, Ene A, NP   500 mg at 10/31/20 2132   multivitamin with minerals tablet 1 tablet  1 tablet Oral Daily Ajibola, Ene A, NP   1 tablet at 11/01/20 0758   naproxen (NAPROSYN) tablet 500 mg  500 mg Oral BID PRN Ajibola, Ene A,  NP   500 mg at 10/30/20 2142   nicotine (NICODERM CQ - dosed in mg/24 hours) patch 21 mg  21 mg Transdermal Daily Ajibola, Ene A, NP   21 mg at 11/01/20 0800   ondansetron (ZOFRAN-ODT) disintegrating tablet 4 mg  4 mg Oral Q6H PRN Ajibola, Ene A, NP  4 mg at 10/30/20 1317   pantoprazole (PROTONIX) EC tablet 40 mg  40 mg Oral Daily Antonieta Pert, MD   40 mg at 11/01/20 0759   risperiDONE (RISPERDAL) tablet 1 mg  1 mg Oral Daily Antonieta Pert, MD   1 mg at 11/01/20 0758   risperiDONE (RISPERDAL) tablet 2 mg  2 mg Oral QHS Antonieta Pert, MD   2 mg at 10/31/20 2132   saccharomyces boulardii (FLORASTOR) capsule 250 mg  250 mg Oral BID Antonieta Pert, MD   250 mg at 11/01/20 1610   thiamine tablet 100 mg  100 mg Oral Daily Ajibola, Ene A, NP   100 mg at 11/01/20 0758   traZODone (DESYREL) tablet 50 mg  50 mg Oral QHS PRN Ajibola, Ene A, NP   50 mg at 10/31/20 2132    Lab Results:  Results for orders placed or performed during the hospital encounter of 10/30/20 (from the past 48 hour(s))  Hepatitis panel, acute     Status: Abnormal   Collection Time: 10/30/20  6:28 PM  Result Value Ref Range   Hepatitis B Surface Ag NON REACTIVE NON REACTIVE   HCV Ab Reactive (A) NON REACTIVE    Comment: (NOTE) The CDC recommends that a Reactive HCV antibody result be followed up  with a HCV Nucleic Acid Amplification test.     Hep A IgM NON REACTIVE NON REACTIVE   Hep B C IgM NON REACTIVE NON REACTIVE    Comment: Performed at [facility], [address], [place], [place] [address]  Urinalysis, Complete w Microscopic Urine, Random     Status: Abnormal   Collection Time: 10/30/20  6:51 PM  Result Value Ref Range   Color, Urine YELLOW YELLOW   APPearance HAZY (A) CLEAR   Specific Gravity, Urine 1.016 1.005 - 1.030   pH 6.0 5.0 - 8.0   Glucose, UA NEGATIVE NEGATIVE mg/dL   Hgb urine dipstick SMALL (A) NEGATIVE   Bilirubin Urine NEGATIVE NEGATIVE   Ketones, ur NEGATIVE NEGATIVE  mg/dL   Protein, ur NEGATIVE NEGATIVE mg/dL   Nitrite NEGATIVE NEGATIVE   Leukocytes,Ua NEGATIVE NEGATIVE   RBC / HPF 0-5 0 - 5 RBC/hpf   WBC, UA 6-10 0 - 5 WBC/hpf   Bacteria, UA RARE (A) NONE SEEN    Comment: Performed at [facility], [address], [place], [place] [address]  Lipid panel     Status: None   Collection Time: 10/31/20  6:21 PM  Result Value Ref Range   Cholesterol 152 0 - 200 mg/dL   Triglycerides 191 <478 mg/dL   HDL 48 >29 mg/dL   Total CHOL/HDL Ratio 3.2 RATIO   VLDL 22 0 - 40 mg/dL   LDL Cholesterol 82 0 - 99 mg/dL    Comment:        Total Cholesterol/HDL:CHD Risk Coronary Heart Disease Risk Table                     Men   Women  1/2 Average Risk   3.4   3.3  Average Risk       5.0   4.4  2 X Average Risk   9.6   7.1  3 X Average Risk  23.4   11.0        Use the calculated Patient Ratio above and the CHD Risk Table to determine the patient's CHD Risk.  ATP III CLASSIFICATION (LDL):  <100     mg/dL   Optimal  [id number]  mg/dL   Near or Above                    Optimal  130-159  mg/dL   Borderline  [id number]  mg/dL   High  >027>190     mg/dL   Very High Performed at [facility], [address] [place], [place] [address]   Comprehensive metabolic panel     Status: Abnormal   Collection Time: 10/31/20  6:21 PM  Result Value Ref Range   Sodium 134 (L) 135 - 145 mmol/L   Potassium 4.3 3.5 - 5.1 mmol/L   Chloride 100 98 - 111 mmol/L   CO2 27 22 - 32 mmol/L   Glucose, Bld 136 (H) 70 - 99 mg/dL    Comment: Glucose reference range applies only to samples taken after fasting for at least 8 hours.   BUN 8 6 - 20 mg/dL   Creatinine, Ser 4.400.91 0.61 - 1.24 mg/dL   Calcium 9.2 8.9 - 34.710.3 mg/dL   Total Protein 8.8 (H) 6.5 - 8.1 g/dL   Albumin 3.1 (L) 3.5 - 5.0 g/dL   AST 54 (H) 15 - 41 U/L   ALT 36 0 - 44 U/L   Alkaline Phosphatase 68 38 - 126 U/L   Total Bilirubin 0.6 0.3 - 1.2 mg/dL   GFR, Estimated >42>60 >59>60 mL/min     Comment: (NOTE) Calculated using the CKD-EPI Creatinine Equation (2021)    Anion gap 7 5 - 15    Comment: Performed at [facility], [address] [place], [place] [address]  Hemoglobin A1c     Status: None   Collection Time: 10/31/20  6:21 PM  Result Value Ref Range   Hgb A1c MFr Bld 5.5 4.8 - 5.6 %    Comment: (NOTE) Pre diabetes:          5.7%-6.4%  Diabetes:              >6.4%  Glycemic control for   <7.0% adults with diabetes    Mean Plasma Glucose 111.15 mg/dL    Comment: Performed at [facility], [address] [place], [place] [address]    Blood Alcohol level:  Lab Results  Component Value Date   ETH 239 (H) 10/29/2020   ETH 18 (H) 10/25/2020    Metabolic Disorder Labs: Lab Results  Component Value Date   HGBA1C 5.5 10/31/2020   MPG 111.15 10/31/2020   MPG 105.41 07/17/2020   No results found for: PROLACTIN Lab Results  Component Value Date   CHOL 152 10/31/2020   TRIG 109 10/31/2020   HDL 48 10/31/2020   CHOLHDL 3.2 10/31/2020   VLDL 22 10/31/2020   LDLCALC 82 10/31/2020   LDLCALC 100 (H) 07/17/2020    Physical Findings: CIWA:  CIWA-Ar Total: 3 COWS:  COWS Total Score: 0  Musculoskeletal: Strength & Muscle Tone: decreased Gait & Station:  laying in bed during interview Patient leans: N/A  Psychiatric Specialty Exam:  Presentation  General Appearance: Disheveled  Eye Contact:Good  Speech:Clear and Coherent; Normal Rate  Speech Volume:Normal  Handedness:Right   Mood and Affect  Mood:Depressed  Affect:Depressed   Thought Process  Thought Processes:Coherent; Goal Directed  Descriptions of Associations:Intact  Orientation:Full (Time, Place and Person)  Thought Content:Logical; WDL  History of Schizophrenia/Schizoaffective disorder:Yes  Duration of Psychotic Symptoms:Greater than six months  Hallucinations:Hallucinations:  None Description of Auditory Hallucinations: voices telling him to hurt  himself  Ideas of Reference:None  Suicidal Thoughts:Suicidal Thoughts: No  Homicidal Thoughts:Homicidal Thoughts: No  Sensorium  Memory:Immediate Fair; Recent Fair  Judgment:Poor  Insight:Poor   Executive Functions  Concentration:Good  Attention Span:Good  Recall:Good  Fund of Knowledge:Good  Language:Good   Psychomotor Activity  Psychomotor Activity:Psychomotor Activity: Normal   Assets  Assets:Communication Skills; Resilience   Sleep  Sleep:Sleep: Good Number of Hours of Sleep: 6.75    Physical Exam: Physical Exam Vitals and nursing note reviewed.  Constitutional:      General: He is not in acute distress.    Appearance: He is normal weight. He is diaphoretic (improved). He is not ill-appearing or toxic-appearing.  HENT:     Head: Normocephalic and atraumatic.  Pulmonary:     Effort: Pulmonary effort is normal.  Musculoskeletal:        General: Normal range of motion.  Neurological:     General: No focal deficit present.     Mental Status: He is alert.   Review of Systems  Respiratory:  Positive for shortness of breath (improving). Negative for cough.   Cardiovascular:  Positive for chest pain (improving).  Gastrointestinal:  Positive for nausea. Negative for abdominal pain, constipation, diarrhea and vomiting.  Neurological:  Positive for headaches. Negative for weakness.  Psychiatric/Behavioral:  Negative for depression, hallucinations and suicidal ideas. The patient is not nervous/anxious.   Blood pressure 116/76, pulse 77, temperature 97.6 F (36.4 C), temperature source Oral, resp. rate 18, height  (1.753 m), weight 74.4 kg, SpO2 99 %. Body mass index is 24.22 kg/m.   Treatment Plan Summary: Daily contact with patient to assess and evaluate symptoms and progress in treatment   Jerry [name] is a 45 yr old male who presents with SI and command hallucinations. PPHx is significant for Schizophrenia, PTSD, and polysubstance abuse (EtOH,  Meth, Cocaine, and THC), last admission [facility] on 6/2.     Patient did have some agitation yesterday so will increase his Risperdal.  He was pneumonia does seem to be improving with clindamycin.  I have ordered a nebulizer treatment for him to try to break up some of the mucus however our nebulizer is currently broken but nursing is working on getting a new machine down here.  He tolerated starting the Mucinex well yesterday.  His lab work did reveal mild hyponatremia at 134 and slight elevation in AST at 54.  His hepatitis panel did show HCV antibody is reactive so we will order a HCV RNA quantitative lab and redraw CMP tomorrow morning to monitor his sodium and liver enzymes.  Still awaiting HIV results.  Will not make any further medication changes at this time.  We will continue to monitor.     Schizophrenia Vs Substance Induced Thought Disorder: -Continue Risperdal 1 mg AM and 2 mg QHS     Possible Right Pneumonia: -Continue Clindamycin 300 mg q8 (Day 3) -Continue Albuterol nebulizer 2.5 mg q6 PRN -Continue guaifenesin 600 mg BID     Withdrawal: -Continue Seizure Precautions -Continue Ativan 1 mg q6 PRN CIWA>10 -Continue Imodium 2-4 mg PRN -Continue Robaxin 500 mg q8 PRN -Continue Naproxen 500 mg BID PRN -Continue Zofran-ODT 4 mg q6 PRN -Continue Thiamine 100 mg daily     Malnutrition: -Continue Ensure BID -Continue Multivitamin daily     Nicotine Dependence: -Continue Nicotine Patch 21 mg daily     Hypokalemia(resolved):   Hep C: -Antibody positive -Will draw Hep C  RNA tomorrow AM      -Continue Protonix 40 mg daily -Continue Florastor 250 mg BID     -Continue PRN's: Tylenol, Maalox, Atarax, Milk of Magnesia, Trazodone     Lauro Franklin, MD 11/01/2020, 11:34 AM

## 2020-11-01 NOTE — Progress Notes (Signed)
   11/01/20 0053  Psych Admission Type (Psych Patients Only)  Admission Status Involuntary  Psychosocial Assessment  Patient Complaints Anxiety;Substance abuse  Eye Contact Fair  Facial Expression Anxious  Affect Appropriate to circumstance  Speech Logical/coherent  Interaction Assertive  Motor Activity Other (Comment) (WDL)  Appearance/Hygiene Disheveled  Behavior Characteristics Appropriate to situation  Mood Pleasant;Anxious  Thought Process  Coherency WDL  Content WDL  Delusions None reported or observed  Perception Hallucinations  Hallucination Auditory  Judgment Impaired  Confusion None  Danger to Self  Current suicidal ideation? Denies  Self-Injurious Behavior No self-injurious ideation or behavior indicators observed or expressed   Agreement Not to Harm Self Yes  Description of Agreement Pt verbally contracts for safety  Danger to Others  Danger to Others None reported or observed

## 2020-11-01 NOTE — Progress Notes (Signed)
NUTRITION ASSESSMENT  Pt identified as at risk on the Malnutrition Screen Tool  INTERVENTION: 1. Supplements: Ensure Plus PO BID, each provides 350 kcals and 13g protein   NUTRITION DIAGNOSIS: Unintentional weight loss related to sub-optimal intake as evidenced by pt report.   Goal: Pt to meet >/= 90% of their estimated nutrition needs.  Monitor:  PO intake  Assessment:  Pt admitted for hallucinations and SI. Pt with history of schizophrenia and polysubstance abuse (EtOH, meth, cocaine, and THC). Pt reports having dry heaves and nausea. Pt has been living out of motels.  Per weight records, no significant weight loss noted.  Ensure supplements have been ordered.   Height: Ht Readings from Last 1 Encounters:  10/30/20 5\' 9"  (1.753 m)    Weight: Wt Readings from Last 1 Encounters:  10/30/20 74.4 kg    Weight Hx: Wt Readings from Last 10 Encounters:  10/30/20 74.4 kg  10/29/20 68.9 kg  09/06/20 68.9 kg  07/24/20 74.8 kg  07/16/20 73.5 kg  10/06/19 68 kg  06/25/14 87.9 kg  05/18/13 83.9 kg    BMI:  Body mass index is 24.22 kg/m. Pt meets criteria for normal based on current BMI.  Estimated Nutritional Needs: Kcal: 25-30 kcal/kg Protein: > 1 gram protein/kg Fluid: 1 ml/kcal  Diet Order:  Diet Order             Diet regular Room service appropriate? Yes; Fluid consistency: Thin  Diet effective now                  Pt is also offered choice of unit snacks mid-morning and mid-afternoon.  Pt is eating as desired.   Lab results and medications reviewed.   07/18/13, MS, RD, LDN Inpatient Clinical Dietitian Contact information available via Amion

## 2020-11-01 NOTE — [facility] Group Notes (Signed)
Occupational Therapy Group Note Date: 11/01/2020 Group Topic/Focus: Sleep Hygiene  Group Description: Group encouraged increased engagement and participation through group discussion focused on Sleep Hygiene. Patients were encouraged to share their own concerns and difficulties with getting a healthy night's rest. Patients were then provided education and resources on habits/routines to maintain healthy sleep hygiene.  Therapeutic Goal(s): Identify and share any current issues with sleep routine and overall sleep hygiene Identify strategies, both environmental and lifestyle, that could help to improve overall sleep quality Participation Level: Active   Participation Quality: Independent   Behavior: Cooperative   Speech/Thought Process: Focused   Affect/Mood: Euthymic   Insight: Fair   Judgement: Fair   Individualization: Jerry [name] was active in their participation of group discussion/activity. Pt identified having 'racing thoughts' before bed and being unable to fall asleep; reports he gets 6 hours on a good day, but can go 2-3 days without sleep. Pt tries to sleep with alcohol, though reports it has proven ineffective. Appeared somewhat receptive to education provided.   Modes of Intervention: Discussion and Education  Patient Response to Interventions:  Attentive, Engaged, and Receptive   Plan: Continue to engage patient in OT groups 2 - 3x/week.  11/01/2020  Donne Hazel, MOT, OTR/L

## 2020-11-01 NOTE — Tx Team (Signed)
Interdisciplinary Treatment and Diagnostic Plan Update  11/01/2020 Time of Session: 9:20am Jerry [name] MRN: [id number]  Principal Diagnosis: MDD (major depressive disorder), recurrent severe, without psychosis ([place])  Secondary Diagnoses: Principal Problem:   MDD (major depressive disorder), recurrent severe, without psychosis ([place]) Active Problems:   Polysubstance abuse ([place])   Current Medications:  Current Facility-Administered Medications  Medication Dose Route Frequency Provider Last Rate Last Admin   acetaminophen (TYLENOL) tablet 650 mg  650 mg Oral Q6H PRN Ajibola, Ene A, NP   650 mg at 10/31/20 0621   albuterol (PROVENTIL) (2.5 MG/3ML) 0.083% nebulizer solution 2.5 mg  2.5 mg Nebulization Q6H PRN Briant Cedar, MD   2.5 mg at 11/01/20 0932   alum & mag hydroxide-simeth (MAALOX/MYLANTA) 200-200-20 MG/5ML suspension 30 mL  30 mL Oral Q4H PRN Ajibola, Ene A, NP       clindamycin (CLEOCIN) capsule 300 mg  300 mg Oral Q8H Sharma Covert, MD   300 mg at 11/01/20 0610   dicyclomine (BENTYL) tablet 20 mg  20 mg Oral Q6H PRN Ajibola, Ene A, NP   20 mg at 10/31/20 2034   feeding supplement (ENSURE ENLIVE / ENSURE PLUS) liquid 237 mL  237 mL Oral BID BM Ajibola, Ene A, NP   237 mL at 10/31/20 1426   guaiFENesin (MUCINEX) 12 hr tablet 600 mg  600 mg Oral BID Briant Cedar, MD   600 mg at 11/01/20 0759   hydrOXYzine (ATARAX/VISTARIL) tablet 25 mg  25 mg Oral Q6H PRN Ajibola, Ene A, NP   25 mg at 10/31/20 1357   loperamide (IMODIUM) capsule 2-4 mg  2-4 mg Oral PRN Ajibola, Ene A, NP       LORazepam (ATIVAN) tablet 1 mg  1 mg Oral Q6H PRN Ajibola, Ene A, NP   1 mg at 10/31/20 2034   magnesium hydroxide (MILK OF MAGNESIA) suspension 30 mL  30 mL Oral Daily PRN Ajibola, Ene A, NP       methocarbamol (ROBAXIN) tablet 500 mg  500 mg Oral Q8H PRN Ajibola, Ene A, NP   500 mg at 10/31/20 2132   multivitamin with minerals tablet 1 tablet  1 tablet Oral Daily Ajibola, Ene A, NP   1  tablet at 11/01/20 0758   naproxen (NAPROSYN) tablet 500 mg  500 mg Oral BID PRN Ajibola, Ene A, NP   500 mg at 10/30/20 2142   nicotine (NICODERM CQ - dosed in mg/24 hours) patch 21 mg  21 mg Transdermal Daily Ajibola, Ene A, NP   21 mg at 11/01/20 0800   ondansetron (ZOFRAN-ODT) disintegrating tablet 4 mg  4 mg Oral Q6H PRN Ajibola, Ene A, NP   4 mg at 10/30/20 1317   pantoprazole (PROTONIX) EC tablet 40 mg  40 mg Oral Daily Sharma Covert, MD   40 mg at 11/01/20 0759   risperiDONE (RISPERDAL) tablet 1 mg  1 mg Oral Daily Sharma Covert, MD   1 mg at 11/01/20 0758   risperiDONE (RISPERDAL) tablet 2 mg  2 mg Oral QHS Sharma Covert, MD   2 mg at 10/31/20 2132   saccharomyces boulardii (FLORASTOR) capsule 250 mg  250 mg Oral BID Sharma Covert, MD   250 mg at 11/01/20 0758   thiamine tablet 100 mg  100 mg Oral Daily Ajibola, Ene A, NP   100 mg at 11/01/20 0758   traZODone (DESYREL) tablet 50 mg  50 mg Oral QHS PRN Ajibola, Ene A,  NP   50 mg at 10/31/20 2132   PTA Medications: Medications Prior to Admission  Medication Sig Dispense Refill Last Dose   ARIPiprazole (ABILIFY) 10 MG tablet Take 10 mg by mouth daily. (Patient not taking: No sig reported)      doxycycline (VIBRAMYCIN) 100 MG capsule Take 1 capsule (100 mg total) by mouth 2 (two) times daily. One po bid x 7 days 14 capsule 0    gabapentin (NEURONTIN) 100 MG capsule Take 1 capsule (100 mg total) by mouth 3 (three) times daily. For agitation (Patient not taking: No sig reported) 90 capsule 0    nicotine (NICODERM CQ - DOSED IN MG/24 HOURS) 21 mg/24hr patch Place 1 patch (21 mg total) onto the skin daily. For smoking cessation (Patient not taking: No sig reported) 1 patch 0    ondansetron (ZOFRAN ODT) 4 MG disintegrating tablet 76m ODT q4 hours prn nausea/vomit (Patient not taking: Reported on 10/29/2020) 12 tablet 0    prazosin (MINIPRESS) 2 MG capsule Take 2 mg by mouth at bedtime. (Patient not taking: No sig reported)       risperiDONE (RISPERDAL) 1 MG tablet Take 1 tablet (1 mg total) by mouth at bedtime. For mood control (Patient not taking: No sig reported) 30 tablet 0    traZODone (DESYREL) 50 MG tablet Take 1 tablet (50 mg total) by mouth at bedtime as needed for sleep. (Patient not taking: No sig reported) 30 tablet 0     Patient Stressors: Marital or family conflict Substance abuse  Patient Strengths: Average or above average intelligence Motivation for treatment/growth  Treatment Modalities: Medication Management, Group therapy, Case management,  1 to 1 session with clinician, Psychoeducation, Recreational therapy.   Physician Treatment Plan for Primary Diagnosis: MDD (major depressive disorder), recurrent severe, without psychosis ([place] Long Term Goal(s): Improvement in symptoms so as ready for discharge   Short Term Goals: Ability to identify changes in lifestyle to reduce recurrence of condition will improve Ability to verbalize feelings will improve Ability to disclose and discuss suicidal ideas Ability to demonstrate self-control will improve Ability to identify and develop effective coping behaviors will improve Ability to identify triggers associated with substance abuse/mental health issues will improve  Medication Management: Evaluate patient's response, side effects, and tolerance of medication regimen.  Therapeutic Interventions: 1 to 1 sessions, Unit Group sessions and Medication administration.  Evaluation of Outcomes: Progressing  Physician Treatment Plan for Secondary Diagnosis: Principal Problem:   MDD (major depressive disorder), recurrent severe, without psychosis ([place] Active Problems:   Polysubstance abuse ([place]  Long Term Goal(s): Improvement in symptoms so as ready for discharge   Short Term Goals: Ability to identify changes in lifestyle to reduce recurrence of condition will improve Ability to verbalize feelings will improve Ability to disclose and discuss suicidal  ideas Ability to demonstrate self-control will improve Ability to identify and develop effective coping behaviors will improve Ability to identify triggers associated with substance abuse/mental health issues will improve     Medication Management: Evaluate patient's response, side effects, and tolerance of medication regimen.  Therapeutic Interventions: 1 to 1 sessions, Unit Group sessions and Medication administration.  Evaluation of Outcomes: Progressing   RN Treatment Plan for Primary Diagnosis: MDD (major depressive disorder), recurrent severe, without psychosis ([place] Long Term Goal(s): Knowledge of disease and therapeutic regimen to maintain health will improve  Short Term Goals: Ability to remain free from injury will improve, Ability to verbalize frustration and anger appropriately will improve, Ability to demonstrate self-control, Ability  to identify and develop effective coping behaviors will improve, and Compliance with prescribed medications will improve  Medication Management: RN will administer medications as ordered by provider, will assess and evaluate patient's response and provide education to patient for prescribed medication. RN will report any adverse and/or side effects to prescribing provider.  Therapeutic Interventions: 1 on 1 counseling sessions, Psychoeducation, Medication administration, Evaluate responses to treatment, Monitor vital signs and CBGs as ordered, Perform/monitor CIWA, COWS, AIMS and Fall Risk screenings as ordered, Perform wound care treatments as ordered.  Evaluation of Outcomes: Not Met   LCSW Treatment Plan for Primary Diagnosis: MDD (major depressive disorder), recurrent severe, without psychosis ([place]) Long Term Goal(s): Safe transition to appropriate next level of care at discharge, Engage patient in therapeutic group addressing interpersonal concerns.  Short Term Goals: Engage patient in aftercare planning with referrals and resources,  Increase social support, Increase ability to appropriately verbalize feelings, Identify triggers associated with mental health/substance abuse issues, and Increase skills for wellness and recovery  Therapeutic Interventions: Assess for all discharge needs, 1 to 1 time with Social worker, Explore available resources and support systems, Assess for adequacy in community support network, Educate family and significant other(s) on suicide prevention, Complete Psychosocial Assessment, Interpersonal group therapy.  Evaluation of Outcomes: Not Met   Progress in Treatment: Attending groups: Yes. Participating in groups: Yes. Taking medication as prescribed: Yes. Toleration medication: Yes. Family/Significant other contact made: No, will contact:  declined consents  Patient understands diagnosis: Yes. Discussing patient identified problems/goals with staff: Yes. Medical problems stabilized or resolved: Yes. Denies suicidal/homicidal ideation: Yes. Issues/concerns per patient self-inventory: No.  New problem(s) identified: No, Describe:  none  New Short Term/Long Term Goal(s): detox, medication management for mood stabilization; elimination of SI thoughts; development of comprehensive mental wellness/sobriety plan   Patient Goals:  "To get to a treatment center"  Discharge Plan or Barriers: Patient recently admitted. CSW will continue to follow and assess for appropriate referrals and possible discharge planning.    Reason for Continuation of Hospitalization: Anxiety Depression Medication stabilization Suicidal ideation Withdrawal symptoms  Estimated Length of Stay: 3-5 days  Attendees: Patient: Jerry [name] 11/01/2020   Physician: Fatima Sanger, DO 11/01/2020   Nursing:  11/01/2020   RN Care Manager: 11/01/2020   Social Worker: Darletta Moll, LCSW 11/01/2020  Recreational Therapist:  11/01/2020   Other:  11/01/2020   Other:  11/01/2020   Other: 11/01/2020     Scribe for Treatment  Team: Vassie Moselle, LCSW 11/01/2020 10:36 AM

## 2020-11-01 NOTE — Progress Notes (Signed)
The patient attended the evening A.A.meeting and was appropriate.  

## 2020-11-02 DIAGNOSIS — F29 Unspecified psychosis not due to a substance or known physiological condition: Principal | ICD-10-CM

## 2020-11-02 LAB — HIV-1 RNA QUANT-NO REFLEX-BLD
HIV 1 RNA Quant: <20 copies/mL
LOG10 HIV-1 RNA: UNDETERMINED log10copy/mL

## 2020-11-02 LAB — COMPREHENSIVE METABOLIC PANEL
ALT: 41 U/L (ref 0–44)
AST: 51 U/L — ABNORMAL HIGH (ref 15–41)
Albumin: 3.3 g/dL — ABNORMAL LOW (ref 3.5–5.0)
Alkaline Phosphatase: 58 U/L (ref 38–126)
Anion gap: 7 (ref 5–15)
BUN: 9 mg/dL (ref 6–20)
CO2: 30 mmol/L (ref 22–32)
Calcium: 10 mg/dL (ref 8.9–10.3)
Chloride: 100 mmol/L (ref 98–111)
Creatinine, Ser: 0.82 mg/dL (ref 0.61–1.24)
GFR, Estimated: >60 mL/min (ref >60)
Glucose, Bld: 92 mg/dL (ref 70–99)
Potassium: 4.7 mmol/L (ref 3.5–5.1)
Sodium: 137 mmol/L (ref 135–145)
Total Bilirubin: 0.4 mg/dL (ref 0.3–1.2)
Total Protein: 8.9 g/dL — ABNORMAL HIGH (ref 6.5–8.1)

## 2020-11-02 LAB — URINALYSIS, COMPLETE (UACMP) WITH MICROSCOPIC
Bacteria, UA: NONE SEEN
Bilirubin Urine: NEGATIVE
Glucose, UA: NEGATIVE mg/dL
Hgb urine dipstick: NEGATIVE
Ketones, ur: NEGATIVE mg/dL
Leukocytes,Ua: NEGATIVE
Nitrite: NEGATIVE
Protein, ur: NEGATIVE mg/dL
Specific Gravity, Urine: 1.008 (ref 1.005–1.030)
pH: 8 (ref 5.0–8.0)

## 2020-11-02 MED ORDER — HYDROXYZINE HCL 25 MG PO TABS
25.0000 mg | ORAL_TABLET | Freq: Four times a day (QID) | ORAL | Status: DC | PRN
  Administered 2020-11-02 – 2020-11-03 (×3): 25 mg via ORAL
  Filled 2020-11-02 (×3): qty 1

## 2020-11-02 MED ORDER — ESCITALOPRAM OXALATE 5 MG PO TABS
5.0000 mg | ORAL_TABLET | Freq: Every day | ORAL | Status: DC
  Filled 2020-11-02 (×2): qty 1

## 2020-11-02 MED ORDER — LOPERAMIDE HCL 2 MG PO CAPS: 2.0000 mg | ORAL_CAPSULE | ORAL | Status: DC | PRN

## 2020-11-02 MED ORDER — LORAZEPAM 1 MG PO TABS: 1.0000 mg | ORAL_TABLET | Freq: Four times a day (QID) | ORAL | Status: DC | PRN

## 2020-11-02 MED ORDER — SERTRALINE HCL 25 MG PO TABS
25.0000 mg | ORAL_TABLET | Freq: Every day | ORAL | Status: DC
  Administered 2020-11-02 – 2020-11-04 (×3): 25 mg via ORAL
  Filled 2020-11-02 (×2): qty 1
  Filled 2020-11-02: qty 7
  Filled 2020-11-02 (×3): qty 1

## 2020-11-02 NOTE — B[facility] Counselor (Signed)
CSW spoke with Jerry [name] at [facility] who states that the Pt was denied admission due to his mental health and would need to be on his medications and stable for 14 days before he could attend the treatment center.  CSW explained this to the Pt and let him know that he can contact [facility] on 11/13/2020 if he is still interested at that time.

## 2020-11-02 NOTE — [facility] Group Notes (Signed)
Pt didn't attend group. 

## 2020-11-02 NOTE — Progress Notes (Signed)
Pt is calm, cooperative, pleasant, denies SI/HI/AVH. Will continue to monitor pt per Q15 minute face checks and monitor for safety.

## 2020-11-02 NOTE — Progress Notes (Signed)
   11/01/20 2103  Psych Admission Type (Psych Patients Only)  Admission Status Involuntary  Psychosocial Assessment  Patient Complaints Anxiety;Sadness;Depression;Crying spells  Eye Contact Fair  Facial Expression Sad;Sullen  Affect Appropriate to circumstance  Speech Logical/coherent;Slurred  Interaction Assertive  Motor Activity Slow  Appearance/Hygiene Disheveled  Behavior Characteristics Cooperative;Appropriate to situation  Mood Depressed;Sad  Thought Process  Coherency WDL  Content WDL  Delusions Paranoid  Perception Hallucinations  Hallucination Visual (Patient reports seeing faces that he does not recognize)  Judgment Impaired  Confusion None  Danger to Self  Current suicidal ideation? Denies  Self-Injurious Behavior No self-injurious ideation or behavior indicators observed or expressed   Agreement Not to Harm Self Yes  Description of Agreement Verbal Contract  Danger to Others  Danger to Others None reported or observed

## 2020-11-02 NOTE — Progress Notes (Addendum)
[facility] MD Progress Note  11/02/2020 11:46 AM JEZREEL JUSTINIANO  MRN:  [id number] Subjective:   Mr. Gearhart is a 45 yr old male who presents with SI and command hallucinations. PPHx is significant for Schizophrenia, PTSD, and polysubstance abuse (EtOH, Meth, Cocaine, and THC), last admission [facility] on 6/2.   He reports that he is doing okay today.  He reports that his sleep has been fine.  He reports that his appetite continues to do good.  He reports his chest is still sore and he still does have a cough, however, he does report that they are improving.  He reports no SI, HI, or auditory hallucinations.  He reports that yesterday a few times he did see a face on someone's leg momentarily, however, he reports none today.  Discussed with him continuing to get nebulizer treatments to help with the mucus.  He is still fairly concerned about the results of his HIV test.  Discussed that it can take a few days to get results and that it should be anytime now and when they become available I will let him know.  Discussed with him starting Lexapro for his depression.  Discussed risks and benefits and he had no concerns.  Discussed that we will be getting a chest x-ray today to monitor his pneumonia response to antibiotics.  He reported that he made it prescreening call to [facility] today and that he would follow-up with a full interview tomorrow.  He reports no issues with any of his medications.  He reports no other concerns at present.   Principal Problem: Schizophrenia spectrum disorder with psychotic disorder type not yet determined (HCC) Diagnosis: Principal Problem:   Schizophrenia spectrum disorder with psychotic disorder type not yet determined (HCC) Active Problems:   Alcohol abuse   Methamphetamine abuse (HCC)   Marijuana abuse   Cocaine abuse (HCC)  Total Time spent with patient: 30 minutes  Past Psychiatric History: Schizophrenia, PTSD, and polysubstance abuse (EtOH, Meth, Cocaine, and THC), last admission  [facility] on 6/2  Past Medical History:  Past Medical History:  Diagnosis Date   Anxiety    Bipolar 1 disorder (HCC)    Depression    Hallucination    Paranoid schizophrenia (HCC)    since pt's 20's   PTSD (post-traumatic stress disorder)     Past Surgical History:  Procedure Laterality Date   KIDNEY DONATION Right 2003   Family History:  Family History  Problem Relation Age of Onset   Heart disease Mother    Cancer Mother    Heart disease Father    Diabetes Father    Diabetes Maternal Grandmother    Stroke Paternal Grandfather    Family Psychiatric  History: Mother- Some Disorder Aunt- Bipolar Disorder, Suicide Father- EtOH abuse Both sides of family- EtOH abuse Social History:  Social History   Substance and Sexual Activity  Alcohol Use Yes   Alcohol/week: 32.0 standard drinks   Types: 12 Cans of beer, 20 Shots of liquor per week   Comment: last use 10/28/20- drinks 1/2 gallon vodka daily     Social History   Substance and Sexual Activity  Drug Use Yes   Types: Marijuana, Cocaine, Heroin, Amphetamines, Methamphetamines   Comment: history of IVDA; last use 10/28/20    Social History   Socioeconomic History   Marital status: Married    Spouse name: Not on file   Number of children: Not on file   Years of education: Not on file   Highest education  level: Not on file  Occupational History   Not on file  Tobacco Use   Smoking status: Every Day    Packs/day: 2.00    Types: Cigarettes   Smokeless tobacco: Never  Vaping Use   Vaping Use: Never used  Substance and Sexual Activity   Alcohol use: Yes    Alcohol/week: 32.0 standard drinks    Types: 12 Cans of beer, 20 Shots of liquor per week    Comment: last use 10/28/20- drinks 1/2 gallon vodka daily   Drug use: Yes    Types: Marijuana, Cocaine, Heroin, Amphetamines, Methamphetamines    Comment: history of IVDA; last use 10/28/20   Sexual activity: Yes  Other Topics Concern   Not on file  Social History  Narrative   Pt has been homeless for the past year and living outdoors for the past week; previously he has been living in hotels. Separated from wife. No family support.   Social Determinants of Health   Financial Resource Strain: Not on file  Food Insecurity: Not on file  Transportation Needs: Not on file  Physical Activity: Not on file  Stress: Not on file  Social Connections: Not on file    Sleep: Fair  Appetite:  Good  Current Medications: Current Facility-Administered Medications  Medication Dose Route Frequency Provider Last Rate Last Admin   acetaminophen (TYLENOL) tablet 650 mg  650 mg Oral Q6H PRN Ajibola, Ene A, NP   650 mg at 10/31/20 0621   albuterol (PROVENTIL) (2.5 MG/3ML) 0.083% nebulizer solution 2.5 mg  2.5 mg Nebulization Q6H PRN Lauro Franklin, MD   2.5 mg at 11/02/20 0953   alum & mag hydroxide-simeth (MAALOX/MYLANTA) 200-200-20 MG/5ML suspension 30 mL  30 mL Oral Q4H PRN Ajibola, Ene A, NP       clindamycin (CLEOCIN) capsule 300 mg  300 mg Oral Q8H Antonieta Pert, MD   300 mg at 11/01/20 2100   dicyclomine (BENTYL) tablet 20 mg  20 mg Oral Q6H PRN Ajibola, Ene A, NP   20 mg at 10/31/20 2034   feeding supplement (ENSURE ENLIVE / ENSURE PLUS) liquid 237 mL  237 mL Oral BID BM Ajibola, Ene A, NP   237 mL at 11/01/20 2101   guaiFENesin (MUCINEX) 12 hr tablet 600 mg  600 mg Oral BID Lauro Franklin, MD   600 mg at 11/02/20 0749   hydrOXYzine (ATARAX/VISTARIL) tablet 25 mg  25 mg Oral Q6H PRN Comer Locket, MD       loperamide (IMODIUM) capsule 2-4 mg  2-4 mg Oral PRN Comer Locket, MD       LORazepam (ATIVAN) tablet 1 mg  1 mg Oral Q6H PRN Mason Jim, Tiyon Sanor E, MD       magnesium hydroxide (MILK OF MAGNESIA) suspension 30 mL  30 mL Oral Daily PRN Ajibola, Ene A, NP       methocarbamol (ROBAXIN) tablet 500 mg  500 mg Oral Q8H PRN Ajibola, Ene A, NP   500 mg at 11/01/20 1603   multivitamin with minerals tablet 1 tablet  1 tablet Oral Daily Ajibola,  Ene A, NP   1 tablet at 11/02/20 0749   naproxen (NAPROSYN) tablet 500 mg  500 mg Oral BID PRN Ajibola, Ene A, NP   500 mg at 10/30/20 2142   nicotine (NICODERM CQ - dosed in mg/24 hours) patch 21 mg  21 mg Transdermal Daily Ajibola, Ene A, NP   21 mg at 11/02/20 0751   ondansetron (ZOFRAN-ODT)  disintegrating tablet 4 mg  4 mg Oral Q6H PRN Ajibola, Ene A, NP   4 mg at 10/30/20 1317   pantoprazole (PROTONIX) EC tablet 40 mg  40 mg Oral Daily Antonieta Pertlary, Greg Lawson, MD   40 mg at 11/02/20 0749   risperiDONE (RISPERDAL) tablet 1 mg  1 mg Oral Daily Antonieta Pertlary, Greg Lawson, MD   1 mg at 11/02/20 0749   risperiDONE (RISPERDAL) tablet 2 mg  2 mg Oral QHS Antonieta Pertlary, Greg Lawson, MD   2 mg at 11/01/20 2100   saccharomyces boulardii (FLORASTOR) capsule 250 mg  250 mg Oral BID Antonieta Pertlary, Greg Lawson, MD   250 mg at 11/02/20 16100748   sertraline (ZOLOFT) tablet 25 mg  25 mg Oral Daily Lauro FranklinPashayan, Alexander S, MD       thiamine tablet 100 mg  100 mg Oral Daily Ajibola, Ene A, NP   100 mg at 11/02/20 0749   traZODone (DESYREL) tablet 50 mg  50 mg Oral QHS PRN Ajibola, Ene A, NP   50 mg at 11/01/20 2103    Lab Results:  Results for orders placed or performed during the hospital encounter of 10/30/20 (from the past 48 hour(s))  Lipid panel     Status: None   Collection Time: 10/31/20  6:21 PM  Result Value Ref Range   Cholesterol 152 0 - 200 mg/dL   Triglycerides 960109 <454<150 mg/dL   HDL 48 >09>40 mg/dL   Total CHOL/HDL Ratio 3.2 RATIO   VLDL 22 0 - 40 mg/dL   LDL Cholesterol 82 0 - 99 mg/dL    Comment:        Total Cholesterol/HDL:CHD Risk Coronary Heart Disease Risk Table                     Men   Women  1/2 Average Risk   3.4   3.3  Average Risk       5.0   4.4  2 X Average Risk   9.6   7.1  3 X Average Risk  23.4   11.0        Use the calculated Patient Ratio above and the CHD Risk Table to determine the patient's CHD Risk.        ATP III CLASSIFICATION (LDL):  <100     mg/dL   Optimal  [id number]  mg/dL   Near or  Above                    Optimal  130-159  mg/dL   Borderline  [id number]  mg/dL   High  >213>190     mg/dL   Very High Performed at [facility], [address] [place], [place] [address]   Comprehensive metabolic panel     Status: Abnormal   Collection Time: 10/31/20  6:21 PM  Result Value Ref Range   Sodium 134 (L) 135 - 145 mmol/L   Potassium 4.3 3.5 - 5.1 mmol/L   Chloride 100 98 - 111 mmol/L   CO2 27 22 - 32 mmol/L   Glucose, Bld 136 (H) 70 - 99 mg/dL    Comment: Glucose reference range applies only to samples taken after fasting for at least 8 hours.   BUN 8 6 - 20 mg/dL   Creatinine, Ser 8.460.91 0.61 - 1.24 mg/dL   Calcium 9.2 8.9 - 96.210.3 mg/dL   Total Protein 8.8 (H) 6.5 - 8.1 g/dL   Albumin 3.1 (L) 3.5 - 5.0 g/dL  AST 54 (H) 15 - 41 U/L   ALT 36 0 - 44 U/L   Alkaline Phosphatase 68 38 - 126 U/L   Total Bilirubin 0.6 0.3 - 1.2 mg/dL   GFR, Estimated >91 >47 mL/min    Comment: (NOTE) Calculated using the CKD-EPI Creatinine Equation (2021)    Anion gap 7 5 - 15    Comment: Performed at [facility], [address], [place], [place] [address]  Hemoglobin A1c     Status: None   Collection Time: 10/31/20  6:21 PM  Result Value Ref Range   Hgb A1c MFr Bld 5.5 4.8 - 5.6 %    Comment: (NOTE) Pre diabetes:          5.7%-6.4%  Diabetes:              >6.4%  Glycemic control for   <7.0% adults with diabetes    Mean Plasma Glucose 111.15 mg/dL    Comment: Performed at [facility], [address] [place], [place] [address]  Comprehensive metabolic panel     Status: Abnormal   Collection Time: 11/02/20  6:23 AM  Result Value Ref Range   Sodium 137 135 - 145 mmol/L   Potassium 4.7 3.5 - 5.1 mmol/L   Chloride 100 98 - 111 mmol/L   CO2 30 22 - 32 mmol/L   Glucose, Bld 92 70 - 99 mg/dL    Comment: Glucose reference range applies only to samples taken after fasting for at least 8 hours.   BUN 9 6 - 20 mg/dL   Creatinine, Ser 6.57 0.61 -  1.24 mg/dL   Calcium 84.6 8.9 - 96.2 mg/dL   Total Protein 8.9 (H) 6.5 - 8.1 g/dL   Albumin 3.3 (L) 3.5 - 5.0 g/dL   AST 51 (H) 15 - 41 U/L   ALT 41 0 - 44 U/L   Alkaline Phosphatase 58 38 - 126 U/L   Total Bilirubin 0.4 0.3 - 1.2 mg/dL   GFR, Estimated >95 >28 mL/min    Comment: (NOTE) Calculated using the CKD-EPI Creatinine Equation (2021)    Anion gap 7 5 - 15    Comment: Performed at [facility], [address], [place], [place] [address]    Blood Alcohol level:  Lab Results  Component Value Date   ETH 239 (H) 10/29/2020   ETH 18 (H) 10/25/2020    Metabolic Disorder Labs: Lab Results  Component Value Date   HGBA1C 5.5 10/31/2020   MPG 111.15 10/31/2020   MPG 105.41 07/17/2020   No results found for: PROLACTIN Lab Results  Component Value Date   CHOL 152 10/31/2020   TRIG 109 10/31/2020   HDL 48 10/31/2020   CHOLHDL 3.2 10/31/2020   VLDL 22 10/31/2020   LDLCALC 82 10/31/2020   LDLCALC 100 (H) 07/17/2020    Physical Findings: AIMS: Facial and Oral Movements Muscles of Facial Expression: None, normal Lips and Perioral Area: None, normal Jaw: None, normal Tongue: None, normal,Extremity Movements Upper (arms, wrists, hands, fingers): Minimal (bilateral hand tremors (withdrawals)) Lower (legs, knees, ankles, toes): None, normal, Trunk Movements Neck, shoulders, hips: None, normal, Overall Severity Severity of abnormal movements (highest score from questions above): None, normal Incapacitation due to abnormal movements: None, normal Patient's awareness of abnormal movements (rate only patient's report): No Awareness, Dental Status Current problems with teeth and/or dentures?: No Does patient usually wear dentures?: No  CIWA:  CIWA-Ar Total: 0 COWS:  COWS Total Score: 0  Musculoskeletal: Strength & Muscle Tone: within normal limits Gait & Station: normal Patient leans: N/A  Psychiatric Specialty Exam:  Presentation  General  Appearance: Casual; Fairly Groomed; Appropriate for Environment  Eye Contact:Good  Speech:Clear and Coherent; Normal Rate  Speech Volume:Normal  Handedness:Right   Mood and Affect  Mood:Dysphoric; Anxious  Affect:congruent   Thought Process  Thought Processes:Coherent; Goal Directed  Descriptions of Associations:Intact  Orientation:Full (Time, Place and Person)  Thought Content:Denies AH and reports VH yesterday and denies paranoia; denies ideas of reference or first rank symptoms; not responding to internal/external stimuli on exam  History of Schizophrenia/Schizoaffective disorder:Yes  Duration of Psychotic Symptoms:Greater than six months  Hallucinations:Hallucinations: Visual Description of Visual Hallucinations: saw a face on someones leg momentarily yesterday  Ideas of Reference:None  Suicidal Thoughts:Suicidal Thoughts: No  Homicidal Thoughts:Homicidal Thoughts: No   Sensorium  Memory:Immediate Fair; Recent Fair  Judgment:Poor  Insight:Fair   Executive Functions  Concentration:Good  Attention Span:Good  Recall:Good  Fund of Knowledge:Good  Language:Good   Psychomotor Activity  Psychomotor Activity:Psychomotor Activity: Normal   Assets  Assets:Communication Skills; Resilience   Sleep  Sleep:Sleep: Good Number of Hours of Sleep: 6.5    Physical Exam: Physical Exam Vitals and nursing note reviewed.  Constitutional:      General: He is not in acute distress.    Appearance: Normal appearance. He is not ill-appearing or toxic-appearing.  HENT:     Head: Normocephalic and atraumatic.  Pulmonary:     Effort: Pulmonary effort is normal.  Musculoskeletal:        General: Normal range of motion.  Neurological:     General: No focal deficit present.     Mental Status: He is alert.   Review of Systems  Respiratory:  Positive for cough. Negative for shortness of breath.   Cardiovascular:  Positive for chest pain (sore).   Gastrointestinal:  Negative for abdominal pain, constipation, diarrhea, nausea and vomiting.  Neurological:  Negative for weakness and headaches.  Psychiatric/Behavioral:  Negative for depression, hallucinations and suicidal ideas. The patient is nervous/anxious.   Blood pressure 116/89, pulse (!) 113, temperature 97.8 F (36.6 C), temperature source Oral, resp. rate 18, height  (1.753 m), weight 74.4 kg, SpO2 98 %. Body mass index is 24.22 kg/m.   Treatment Plan Summary: Daily contact with patient to assess and evaluate symptoms and progress in treatment   Mr. Bethel is a 45 yr old male who presents with SI and command hallucinations. PPHx is significant for Schizophrenia, PTSD, and polysubstance abuse (EtOH, Meth, Cocaine, and THC), last admission [facility] on 6/2.   His repreat labs show that his AST continues to downtrend currently at 51 from 54 on 8/14.  Still awaiting results of HIV test.  He had HCV RNA lab drawn this morning.  For his depression we will start Zoloft today.  She continues to have some chest soreness and cough but reports that it is improving.  We will obtain chest x-ray today to monitor progression of pneumonia.  He has placed prescreening call to [facility] for potential placement for long-term treatment.  We will not make any other changes at this time.  We will continue to monitor.   Unspecified schizophrenia spectrum and other psychotic d/o (r/o schizophrenia, r/o substance induced psychosis, r/o MDD with psychotic features, r/o schizoaffective d/o) -Continue Risperdal 1 mg AM and 2 mg QHS -Start Zoloft  daily for residual depressive symptoms     Right Pneumonitis: -Continue Clindamycin 300 mg q8 (Day 4) -Continue Albuterol  nebulizer 2.5 mg q6 PRN -Continue guaifenesin 600 mg BID - Continue Florastore probiotic while on antibiotic -Will obtain Chest X today     Polysubstance abuse: -Continue Seizure Precautions -Continue Ativan 1 mg q6 PRN  CIWA>10 -Continue Imodium 2-4 mg PRN -Continue Robaxin 500 mg q8 PRN -Continue Naproxen 500 mg BID PRN -Continue Zofran-ODT 4 mg q6 PRN -Continue Thiamine 100 mg daily - Continue Protonix 40mg  daily for GI Protection   Malnutrition: -Continue Ensure BID -Continue Multivitamin daily     Nicotine Dependence: -Continue Nicotine Patch 21 mg daily     Hypokalemia(resolved):     Hep C: -Antibody positive -HCV RNA drawn this AM   Abnormal UA (small blood, rare bacteria) -Repeating clean catch UA   -Continue PRN's: Tylenol, Maalox, Atarax, Milk of Magnesia, Trazodone     , PGY-2 11/02/2020, 11:46 AM

## 2020-11-02 NOTE — Progress Notes (Signed)
Recreation Therapy Notes  Animal-Assisted Activity (AAA) Program Checklist/Progress Notes Patient Eligibility Criteria Checklist & Daily Group note for Rec Tx Intervention  Date: 8.16.22 Time: 1430 Location: [address]  AAA/T Program Assumption of Risk Form signed by Engineer, production or Parent Legal Guardian YES   Patient is free of allergies or severe asthma  YES   Patient reports no fear of animals  YES   Patient reports no history of cruelty to animals YES   Patient understands his/her participation is voluntary  YES   Patient washes hands before animal contact  YES  Patient washes hands after animal contact  YES  Behavioral Response: Engaged  Education: Charity fundraiser, Appropriate Animal Interaction   Education Outcome: Acknowledges understanding/In group clarification offered/Needs additional education.   Clinical Observations/Feedback: Pt sat on the floor with Bodi, pet him and talked to him.  Pt also spoke about pets he had growing up.    Caroll Rancher, LRT/CTRS        Caroll Rancher A 11/02/2020 3:50 PM

## 2020-11-02 NOTE — Progress Notes (Signed)
Patient did attend the evening speaker AA meeting.  
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
## 2020-11-02 NOTE — BHH Counselor (Signed)
CSW provided the Pt with a packet of resources that includes: housing and shelter resources, free/reduced price food resources, information about the IRC, an application for a [place] Orange Card, GoodRX cards, and suicide prevention information. 

## 2020-11-03 ENCOUNTER — Inpatient Hospital Stay (HOSPITAL_COMMUNITY)
Admit: 2020-11-03 | Discharge: 2020-11-03 | Disposition: A | Discharge status: Home / Self Care | Payer: Federal, State, Local not specified - Other | Attending: Psychiatry | Admitting: Psychiatry

## 2020-11-03 NOTE — Progress Notes (Signed)
Patient denied SI and HI, contracts for safety.  Denied A/V hallucinations.  Denied pain.  Patient went to [facility] for chest x-ray this morning.  Patient has been appropriate and cooperative throughout the day.

## 2020-11-03 NOTE — BHH Group Notes (Signed)
Type of Therapy and Topic:  Group Therapy:  Self-Esteem   Participation Level:  Active  Description of Group: This group addressed positive self-esteem. Patients were given a worksheet with a blank shield. Patients were asked what a shield is and when it is used. Patients were asked to list, draw, or write protective factors in the their lives on their shields. Patients discussed the words, ideas and drawings that they put on their shield. Patients were encouraged to have a daily reflection of positive characteristics/ protective factors.  Therapeutic Goals Patient will verbalize two of their positive qualities Patient will demonstrate insight but naming social supports in their lives Patient will verbalize their feelings when voicing positive self affirmations and when voicing positive affirmations of others Patients will discuss the potential positive impact on their wellness/recovery of focusing on positive traits of self and others.  Summary of Patient Progress:  The Pt reports that he uses faith, hope, and strength as protective factors for his life.  The Pt participated in group discussion with their peers and accepted the worksheet that was provided.

## 2020-11-03 NOTE — BHH Counselor (Signed)
CSW contacted [facility] to check on the progress of the Pt's referral.  CSW was informed that the Pt has not been denied but also has not been approved at this time.  CSW was told to contact [facility] tomorrow to check back on the progress of the referral.

## 2020-11-03 NOTE — Progress Notes (Signed)
Recreation Therapy Notes  Date:  8.17.22 Time: 0930 Location: [address]  Group Topic: Stress Management  Goal Area(s) Addresses:  Patient will identify positive stress management techniques. Patient will identify benefits of using stress management post d/c.  Intervention: Stress Management  Activity:  Meditation.  LRT played a meditation that focused on calming anxiety.  Meditation spoke of acknowledging your feelings but not letting them take over your thoughts and emotions.  Education:  Stress Management, Discharge Planning.   Education Outcome: Acknowledges Education  Clinical Observations/Feedback: Pt did not attend group session.    Nelda Luckey Drema Halon A 11/03/2020 12:09 PM

## 2020-11-03 NOTE — Progress Notes (Addendum)
[facility] MD Progress Note  11/03/2020 10:45 AM Jerry [name]  MRN:  [id number] Subjective:   Jerry [name] is a 45 yr old male who presents with SI and command hallucinations. PPHx is significant for Schizophrenia, PTSD, and polysubstance abuse (EtOH, Meth, Cocaine, and THC), last admission [facility] on 6/2.   He reports that he is continuing to improve and is doing okay today.  He reports that his sleep is good.  He reports his appetite is good.  He reports no SI, HI, or AVH.  He reports that his withdrawal symptoms are almost completely gone and currently only has hand tremor.  He reports no more sweating or headache.  He reports his chest is still sore however his coughing is improved.  He reports that he has started to cough up bits of mucus.  Discussed continuing with the Mucinex and albuterol nebulizer treatments to continue to help clear mucus.  Discussed with him that today we would be able to send him to [facility] for the chest x-ray he was agreeable to that.  He reports no issues with starting Zoloft yesterday.  He was very relieved to hear that his HIV resulted as negative.  He reports that he had a pretty interview yesterday with one of the facilities and is awaiting to hear back.  He has no other concerns at present.   Principal Problem: Schizophrenia spectrum disorder with psychotic disorder type not yet determined (HCC) Diagnosis: Principal Problem:   Schizophrenia spectrum disorder with psychotic disorder type not yet determined (HCC) Active Problems:   Alcohol abuse   Methamphetamine abuse (HCC)   Marijuana abuse   Cocaine abuse (HCC)  Total Time spent with patient: 30 minutes  Past Psychiatric History: Schizophrenia, PTSD, and polysubstance abuse (EtOH, Meth, Cocaine, and THC), last admission [facility] on 6/2  Past Medical History:  Past Medical History:  Diagnosis Date   Anxiety    Bipolar 1 disorder (HCC)    Depression    Hallucination    Paranoid schizophrenia (HCC)    since pt's  20's   PTSD (post-traumatic stress disorder)     Past Surgical History:  Procedure Laterality Date   KIDNEY DONATION Right 2003   Family History:  Family History  Problem Relation Age of Onset   Heart disease Mother    Cancer Mother    Heart disease Father    Diabetes Father    Diabetes Maternal Grandmother    Stroke Paternal Grandfather    Family Psychiatric  History: Mother- Some Disorder Aunt- Bipolar Disorder, Suicide Father- EtOH abuse Both sides of family- EtOH abuse Social History:  Social History   Substance and Sexual Activity  Alcohol Use Yes   Alcohol/week: 32.0 standard drinks   Types: 12 Cans of beer, 20 Shots of liquor per week   Comment: last use 10/28/20- drinks 1/2 gallon vodka daily     Social History   Substance and Sexual Activity  Drug Use Yes   Types: Marijuana, Cocaine, Heroin, Amphetamines, Methamphetamines   Comment: history of IVDA; last use 10/28/20    Social History   Socioeconomic History   Marital status: Married    Spouse name: Not on file   Number of children: Not on file   Years of education: Not on file   Highest education level: Not on file  Occupational History   Not on file  Tobacco Use   Smoking status: Every Day    Packs/day: 2.00    Types: Cigarettes   Smokeless tobacco:  Never  Vaping Use   Vaping Use: Never used  Substance and Sexual Activity   Alcohol use: Yes    Alcohol/week: 32.0 standard drinks    Types: 12 Cans of beer, 20 Shots of liquor per week    Comment: last use 10/28/20- drinks 1/2 gallon vodka daily   Drug use: Yes    Types: Marijuana, Cocaine, Heroin, Amphetamines, Methamphetamines    Comment: history of IVDA; last use 10/28/20   Sexual activity: Yes  Other Topics Concern   Not on file  Social History Narrative   Pt has been homeless for the past year and living outdoors for the past week; previously he has been living in hotels. Separated from wife. No family support.   Social Determinants of  Health   Financial Resource Strain: Not on file  Food Insecurity: Not on file  Transportation Needs: Not on file  Physical Activity: Not on file  Stress: Not on file  Social Connections: Not on file   Additional Social History:                         Sleep: Good  Appetite:  Good  Current Medications: Current Facility-Administered Medications  Medication Dose Route Frequency Provider Last Rate Last Admin   acetaminophen (TYLENOL) tablet 650 mg  650 mg Oral Q6H PRN Ajibola, Ene A, NP   650 mg at 10/31/20 0621   albuterol (PROVENTIL) (2.5 MG/3ML) 0.083% nebulizer solution 2.5 mg  2.5 mg Nebulization Q6H PRN Lauro FranklinPashayan, Alexander S, MD   2.5 mg at 11/02/20 0953   alum & mag hydroxide-simeth (MAALOX/MYLANTA) 200-200-20 MG/5ML suspension 30 mL  30 mL Oral Q4H PRN Ajibola, Ene A, NP       clindamycin (CLEOCIN) capsule 300 mg  300 mg Oral Q8H Antonieta Pertlary, Greg Lawson, MD   300 mg at 11/03/20 0714   dicyclomine (BENTYL) tablet 20 mg  20 mg Oral Q6H PRN Ajibola, Ene A, NP   20 mg at 10/31/20 2034   feeding supplement (ENSURE ENLIVE / ENSURE PLUS) liquid 237 mL  237 mL Oral BID BM Ajibola, Ene A, NP   237 mL at 11/02/20 1640   guaiFENesin (MUCINEX) 12 hr tablet 600 mg  600 mg Oral BID Lauro FranklinPashayan, Alexander S, MD   600 mg at 11/03/20 0827   hydrOXYzine (ATARAX/VISTARIL) tablet 25 mg  25 mg Oral Q6H PRN Comer LocketSingleton, Na Waldrip E, MD   25 mg at 11/03/20 0825   loperamide (IMODIUM) capsule 2-4 mg  2-4 mg Oral PRN Comer LocketSingleton, Shaquoia Miers E, MD       LORazepam (ATIVAN) tablet 1 mg  1 mg Oral Q6H PRN Mason JimSingleton, Efstathios Sawin E, MD       magnesium hydroxide (MILK OF MAGNESIA) suspension 30 mL  30 mL Oral Daily PRN Ajibola, Ene A, NP       methocarbamol (ROBAXIN) tablet 500 mg  500 mg Oral Q8H PRN Ajibola, Ene A, NP   500 mg at 11/01/20 1603   multivitamin with minerals tablet 1 tablet  1 tablet Oral Daily Ajibola, Ene A, NP   1 tablet at 11/03/20 0820   naproxen (NAPROSYN) tablet 500 mg  500 mg Oral BID PRN Ajibola, Ene A, NP    500 mg at 10/30/20 2142   nicotine (NICODERM CQ - dosed in mg/24 hours) patch 21 mg  21 mg Transdermal Daily Ajibola, Ene A, NP   21 mg at 11/03/20 0824   ondansetron (ZOFRAN-ODT) disintegrating tablet 4 mg  4  mg Oral Q6H PRN Ajibola, Ene A, NP   4 mg at 10/30/20 1317   pantoprazole (PROTONIX) EC tablet 40 mg  40 mg Oral Daily Antonieta Pert, MD   40 mg at 11/03/20 1601   risperiDONE (RISPERDAL) tablet 1 mg  1 mg Oral Daily Antonieta Pert, MD   1 mg at 11/03/20 0820   risperiDONE (RISPERDAL) tablet 2 mg  2 mg Oral QHS Antonieta Pert, MD   2 mg at 11/02/20 2131   saccharomyces boulardii (FLORASTOR) capsule 250 mg  250 mg Oral BID Antonieta Pert, MD   250 mg at 11/03/20 0932   sertraline (ZOLOFT) tablet 25 mg  25 mg Oral Daily Lauro Franklin, MD   25 mg at 11/03/20 3557   thiamine tablet 100 mg  100 mg Oral Daily Ajibola, Ene A, NP   100 mg at 11/03/20 0820   traZODone (DESYREL) tablet 50 mg  50 mg Oral QHS PRN Ajibola, Ene A, NP   50 mg at 11/02/20 2131    Lab Results:  Results for orders placed or performed during the hospital encounter of 10/30/20 (from the past 48 hour(s))  Comprehensive metabolic panel     Status: Abnormal   Collection Time: 11/02/20  6:23 AM  Result Value Ref Range   Sodium 137 135 - 145 mmol/L   Potassium 4.7 3.5 - 5.1 mmol/L   Chloride 100 98 - 111 mmol/L   CO2 30 22 - 32 mmol/L   Glucose, Bld 92 70 - 99 mg/dL    Comment: Glucose reference range applies only to samples taken after fasting for at least 8 hours.   BUN 9 6 - 20 mg/dL   Creatinine, Ser 3.22 0.61 - 1.24 mg/dL   Calcium 02.5 8.9 - 42.7 mg/dL   Total Protein 8.9 (H) 6.5 - 8.1 g/dL   Albumin 3.3 (L) 3.5 - 5.0 g/dL   AST 51 (H) 15 - 41 U/L   ALT 41 0 - 44 U/L   Alkaline Phosphatase 58 38 - 126 U/L   Total Bilirubin 0.4 0.3 - 1.2 mg/dL   GFR, Estimated >06 >23 mL/min    Comment: (NOTE) Calculated using the CKD-EPI Creatinine Equation (2021)    Anion gap 7 5 - 15    Comment:  Performed at [facility], [address], [place], [place] [address]  Urinalysis, Complete w Microscopic Urine, Clean Catch     Status: None   Collection Time: 11/02/20 11:55 AM  Result Value Ref Range   Color, Urine YELLOW YELLOW   APPearance CLEAR CLEAR   Specific Gravity, Urine 1.008 1.005 - 1.030   pH 8.0 5.0 - 8.0   Glucose, UA NEGATIVE NEGATIVE mg/dL   Hgb urine dipstick NEGATIVE NEGATIVE   Bilirubin Urine NEGATIVE NEGATIVE   Ketones, ur NEGATIVE NEGATIVE mg/dL   Protein, ur NEGATIVE NEGATIVE mg/dL   Nitrite NEGATIVE NEGATIVE   Leukocytes,Ua NEGATIVE NEGATIVE   RBC / HPF 0-5 0 - 5 RBC/hpf   WBC, UA 0-5 0 - 5 WBC/hpf   Bacteria, UA NONE SEEN NONE SEEN    Comment: Performed at [facility], [address] [place], [place] [address]    Blood Alcohol level:  Lab Results  Component Value Date   ETH 239 (H) 10/29/2020   ETH 18 (H) 10/25/2020    Metabolic Disorder Labs: Lab Results  Component Value Date   HGBA1C 5.5 10/31/2020   MPG 111.15 10/31/2020   MPG  105.41 07/17/2020   No results found for: PROLACTIN Lab Results  Component Value Date   CHOL 152 10/31/2020   TRIG 109 10/31/2020   HDL 48 10/31/2020   CHOLHDL 3.2 10/31/2020   VLDL 22 10/31/2020   LDLCALC 82 10/31/2020   LDLCALC 100 (H) 07/17/2020    Physical Findings: AIMS: Facial and Oral Movements Muscles of Facial Expression: None, normal Lips and Perioral Area: None, normal Jaw: None, normal Tongue: None, normal,Extremity Movements Upper (arms, wrists, hands, fingers): Minimal (bilateral hand tremors (withdrawals)) Lower (legs, knees, ankles, toes): None, normal, Trunk Movements Neck, shoulders, hips: None, normal, Overall Severity Severity of abnormal movements (highest score from questions above): None, normal Incapacitation due to abnormal movements: None, normal Patient's awareness of abnormal movements (rate only patient's report): No Awareness, Dental  Status Current problems with teeth and/or dentures?: No Does patient usually wear dentures?: No  CIWA:  CIWA-Ar Total: 0 COWS:  COWS Total Score: 4  Musculoskeletal: Strength & Muscle Tone: within normal limits Gait & Station: normal Patient leans: N/A  Psychiatric Specialty Exam:  Presentation  General Appearance: Appropriate for Environment; Casual; Fairly Groomed  Eye Contact:Good  Speech:Clear and Coherent; Normal Rate  Speech Volume:Normal  Handedness:Right   Mood and Affect  Mood:Anxious; Dysphoric  Affect:constricted   Thought Process  Thought Processes:Coherent; Goal Directed  Descriptions of Associations:Intact  Orientation:Full (Time, Place and Person)  Thought Content:Logical - denies paranoia, AVH, and makes not delusional statements; is not grossly responding to internal/external stimuli on exam  History of Schizophrenia/Schizoaffective disorder:Yes  Duration of Psychotic Symptoms:Greater than six months  Hallucinations: Denied  Ideas of Reference:None  Suicidal Thoughts:Suicidal Thoughts: No  Homicidal Thoughts:Homicidal Thoughts: No   Sensorium  Memory:Immediate Fair; Recent Fair  Judgment:Fair  Insight:Improving   Executive Functions  Concentration:Good  Attention Span:Good  Recall:Good  Fund of Knowledge:Good  Language:Good   Psychomotor Activity  Psychomotor Activity:Psychomotor Activity: Normal   Assets  Assets:Communication Skills; Resilience   Sleep  Sleep:Sleep: Good Number of Hours of Sleep: 6.5    Physical Exam: Physical Exam Vitals and nursing note reviewed.  Constitutional:      General: He is not in acute distress.    Appearance: Normal appearance. He is normal weight. He is not ill-appearing or toxic-appearing.  HENT:     Head: Normocephalic and atraumatic.  Pulmonary:     Effort: Pulmonary effort is normal.  Musculoskeletal:        General: Normal range of motion.  Neurological:      General: No focal deficit present.     Mental Status: He is alert.   Review of Systems  Respiratory:  Positive for cough (improving). Negative for shortness of breath.   Cardiovascular:  Positive for chest pain (mild sore).  Gastrointestinal:  Negative for abdominal pain, constipation, diarrhea, nausea and vomiting.  Neurological:  Positive for tremors (mild bilateral hand significant improvment). Negative for weakness and headaches.  Psychiatric/Behavioral:  Negative for depression, hallucinations and suicidal ideas. The patient is not nervous/anxious.   Blood pressure 106/78, pulse (!) 122, temperature 98 F (36.7 C), temperature source Oral, resp. rate 17, height 5\' 9"  (1.753 m), weight 74.4 kg, SpO2 100 %. Body mass index is 24.22 kg/m.   Treatment Plan Summary: Daily contact with patient to assess and evaluate symptoms and progress in treatment   Mr. Lybbert is a 45 yr old male who presents with SI and command hallucinations. PPHx is significant for Schizophrenia, PTSD, and polysubstance abuse (EtOH, Meth, Cocaine, and THC), last admission  [facility] on 6/2.     He tolerated starting Zoloft well.  He appears to be nearing the end of withdrawal from alcohol.  He currently is having only mild hand tremors.  Still working on getting him placement at a treatment facility.  Yesterday he was unable to get that chest x-ray but he will have it done today and await results.  His HIV results did come back and he is negative.  Repeat UA is negative.  Will not make any medication changes at this time.  We will continue to monitor.     Unspecified schizophrenia spectrum and other psychotic d/o (r/o schizophrenia, r/o substance induced psychosis, r/o MDD with psychotic features, r/o schizoaffective d/o) -Continue Risperdal 1 mg AM and 2 mg QHS -Continue Zoloft 25mg  daily      Right Pneumonitis: -Continue Clindamycin 300 mg q8 (Day 4) -Continue Albuterol nebulizer 2.5 mg q6 PRN -Continue guaifenesin  600 mg BID -Continue Florastore probiotic while on antibiotic -Will obtain Chest X today     Polysubstance abuse: -Continue Seizure Precautions -Continue Ativan 1 mg q6 PRN CIWA>10 -Continue Imodium 2-4 mg PRN -Continue Robaxin 500 mg q8 PRN -Continue Naproxen 500 mg BID PRN -Continue Zofran-ODT 4 mg q6 PRN -Continue Thiamine 100 mg daily -Continue Protonix 40mg  daily for GI Protection - SW assisting with residential rehab referral  Malnutrition: -Continue Ensure BID -Continue Multivitamin daily     Nicotine Dependence: -Continue Nicotine Patch 21 mg daily     Hypokalemia(resolved):     Hep C: -Antibody positive -HCV RNA drawn 8/16    Abnormal UA (small blood, rare bacteria)- resolved -Repeat UA neg    -Continue PRN's: Tylenol, Maalox, Atarax, Milk of Magnesia, Trazodone     , MD 11/03/2020, 10:45 AM

## 2020-11-03 NOTE — [facility] Counselor (Signed)
CSW spoke with [facility] who states that the Pt was denied because they believe he needs a mental health facility and not a substance use facility.

## 2020-11-03 NOTE — Plan of Care (Signed)
Nurse discussed coping skills with patient.  

## 2020-11-03 NOTE — Progress Notes (Signed)
   11/02/20 2131  Psych Admission Type (Psych Patients Only)  Admission Status Involuntary  Psychosocial Assessment  Patient Complaints Anxiety;Depression;Sadness  Eye Contact Fair  Facial Expression Sad;Sullen  Affect Appropriate to circumstance  Speech Logical/coherent;Slurred  Interaction Assertive  Motor Activity Slow  Appearance/Hygiene Disheveled  Behavior Characteristics Cooperative;Appropriate to situation  Mood Depressed;Sad  Thought Process  Coherency WDL  Content WDL  Delusions Paranoid  Perception Hallucinations  Hallucination Visual (Patient reports seeing faces he does not recognize)  Judgment Impaired  Confusion None  Danger to Self  Current suicidal ideation? Denies  Self-Injurious Behavior No self-injurious ideation or behavior indicators observed or expressed   Agreement Not to Harm Self Yes  Description of Agreement Verbal contract  Danger to Others  Danger to Others None reported or observed

## 2020-11-04 LAB — HCV RNA QUANT
HCV Quantitative Log: 6.869 log10 IU/mL (ref >1.70)
HCV Quantitative: 7400000 IU/mL (ref >50)

## 2020-11-04 MED ORDER — RISPERIDONE 1 MG PO TABS
1.0000 mg | ORAL_TABLET | Freq: Every day | ORAL | 0 refills | Status: DC
  Filled 2020-11-30: qty 30, 30d supply, fill #0

## 2020-11-04 MED ORDER — RISPERIDONE 2 MG PO TABS
2.0000 mg | ORAL_TABLET | Freq: Every day | ORAL | 0 refills | Status: DC
  Filled 2020-11-30: qty 30, 30d supply, fill #0

## 2020-11-04 MED ORDER — SERTRALINE HCL 25 MG PO TABS
25.0000 mg | ORAL_TABLET | Freq: Every day | ORAL | 0 refills | Status: DC
  Filled 2020-11-30: qty 30, 30d supply, fill #0

## 2020-11-04 MED ORDER — CLINDAMYCIN HCL 300 MG PO CAPS: 300.0000 mg | ORAL_CAPSULE | Freq: Three times a day (TID) | ORAL | 0 refills | Status: AC

## 2020-11-04 NOTE — [facility] Group Notes (Signed)
[facility] Group Notes:  (Nursing/MHT/Case Management/Adjunct)  Date:  11/04/2020  Time:  9:30 AM  Type of Therapy:  Group Therapy  Participation Level:  Minimal  Participation Quality:  Appropriate  Affect:  Appropriate  Cognitive:  Appropriate  Insight:  Appropriate  Engagement in Group:  Lacking  Modes of Intervention:  Discussion  Summary of Progress/Problems:Pt left the group after the introduction and stated he will be back for next group.  Jaquita Rector 11/04/2020, 9:30 AM

## 2020-11-04 NOTE — [facility] Suicide Risk Assessment (Signed)
[facility] INPATIENT:  Family/Significant Other Suicide Prevention Education  Suicide Prevention Education:  Education Completed; Jerry [name] [phone] (Wife) has been identified by the patient as the family member/significant other with whom the patient will be residing, and identified as the person(s) who will aid the patient in the event of a mental health crisis (suicidal ideations/suicide attempt).  With written consent from the patient, the family member/significant other has been provided the following suicide prevention education, prior to the and/or following the discharge of the patient.  The suicide prevention education provided includes the following: Suicide risk factors Suicide prevention and interventions National Suicide Hotline telephone number [facility] assessment telephone number [facility] Emergency Assistance 911 [facility] and/or Residential Mobile Crisis Unit telephone number  Request made of family/significant other to: Remove weapons (e.g., guns, rifles, knives), all items previously/currently identified as safety concern.   Remove drugs/medications (over-the-counter, prescriptions, illicit drugs), all items previously/currently identified as a safety concern.  The family member/significant other verbalizes understanding of the suicide prevention education information provided.  The family member/significant other agrees to remove the items of safety concern listed above.  CSW spoke with Jerry [name] who states that her husband has an issue with alcohol use and states that it "interferes with his mental health concerns".  Jerry [name] states that when he is on his medications and not drinking he acts differently.  Jerry [name] states that she will be helping him get an ID, birth certificate, and get to work.  Jerry [name] states that she will be taking him to his appointment. Jerry [name] states that there are no weapons and firearms in the home and that  her husband can come back to her home.  CSW completed SPE with Jerry [name].   Jerry [name] 11/04/2020, 9:46 AM

## 2020-11-04 NOTE — Progress Notes (Signed)
Syrus has been up and visible on the unit.  He continues to report depression 10 and anxiety 10 (10 the worst).  He reported having a good day.  He denied SI/HI or AVH.  He took Trazodone and vistaril at bedtime.  He is currently resting with his eyes closed and appears to be asleep.  Q 15 minute checks maintained for safety.     11/03/20 2122  Psych Admission Type (Psych Patients Only)  Admission Status Involuntary  Psychosocial Assessment  Patient Complaints Anxiety;Depression  Eye Contact Fair  Facial Expression Sad;Sullen  Affect Appropriate to circumstance  Speech Logical/coherent;Slurred  Interaction Assertive  Motor Activity Slow  Appearance/Hygiene Disheveled  Behavior Characteristics Cooperative;Appropriate to situation  Mood Anxious;Depressed  Thought Process  Coherency WDL  Content WDL  Delusions None reported or observed  Perception WDL  Hallucination None reported or observed  Judgment WDL  Confusion None  Danger to Self  Current suicidal ideation? Denies  Danger to Others  Danger to Others None reported or observed

## 2020-11-04 NOTE — Discharge Summary (Addendum)
Physician Discharge Summary Note  Patient:  Jerry [name] is an 45 y.o., male MRN:  [id number] DOB:  08-26-1975 Patient phone:  [phone] (home)  Patient address:   [address] [place] [address],  Total Time spent with patient: 30 minutes  Date of Admission:  10/30/2020 Date of Discharge: 11/04/2020  Reason for Admission:  Per H&P- "Mr. Dambrosia is a 45 yr old male who presents with SI and command hallucinations. PPHx is significant for Schizophrenia, PTSD, and polysubstance abuse (EtOH, Meth, Cocaine, and THC), last admission [facility] on 6/2.   He reports that he had been sober for 3 years 2018-2020 but then started drinking again. He reports that along with drinking he has been using multiple substances.  He reports that he is used heroine, meth, ice, Coke, THC, "anything I can get my hands on."  He reports that he has only been on heroin 6 times in the past accidentally.  He reports he recently got back together with the mother of his oldest son he states that this was a bad idea because they never worked out.  He reports however that they once again broke up and that he had been living for several months with "friends" in motel rooms.  He reports that they then had a falling out and so for the last week or 2 has been living in the woods.  He reports that all of this culminated in a desire for him to end his life.  He reports that he took 35 cc of heroin and 40 cc of methamphetamines and injected himself with it to intentionally overdose.  He was found down by paramedics and given Narcan and brought to the hospital.  He reports that the voices in his head have always been there since he was approximately 45 years old.  He reports that lately the voices have gotten worse telling him bad things like to hurt himself.  He reports seeing people.  He reports that he will be walking and turned around and suddenly someone who he thought was staring there was no longer standing there.  He does  report paranoia thinking that people are following him.  Currently he reports he still has SI and command auditory hallucinations.  He reports no HI and no current visual hallucinations.   Patient has an extensive history per chart review.  He has been in psych hospitalizations multiple times and been in EGDs multiple times in [place], [place], and [place].  He has been to rehab approximately 10 times by his count.  He has 1 previous suicide attempt of walking into traffic.  He reports that over the last few weeks he has felt depressed, anhedonic, feelings of worthlessness/guilt, feelings of hopelessness, and issues with sleep.  He reports that previously he was on Abilify, gabapentin, prazosin, Risperdal, and trazodone.  He reports that he did not continue taking these because he could not afford the medications.  He also reports he could not attend follow-up appointments because he would have no way to get there.  He reports using several substances as listed above.  He reports drinking 2/5 of alcohol daily.  He expresses the desire to go to long-term rehab."   Principal Problem: Schizophrenia spectrum disorder with psychotic disorder type not yet determined [facility]) Discharge Diagnoses: Principal Problem:   Schizophrenia spectrum disorder with psychotic disorder type not yet determined (HCC) Active Problems:   Alcohol abuse   Methamphetamine abuse (HCC)   Marijuana abuse   Cocaine  abuse [facility](HCC)   Past Psychiatric History: Schizophrenia, PTSD, and polysubstance abuse (EtOH, Meth, Cocaine, and THC), [facility] on [facility]  Past Medical History:  Past Medical History:  Diagnosis Date   Anxiety    Bipolar 1 disorder (HCC)    Depression    Hallucination    Paranoid schizophrenia (HCC)    since pt's 20's   PTSD (post-traumatic stress disorder)     Past Surgical History:  Procedure Laterality Date   KIDNEY DONATION Right 2003   Family History:  Family History  Problem  Relation Age of Onset   Heart disease Mother    Cancer Mother    Heart disease Father    Diabetes Father    Diabetes Maternal Grandmother    Stroke Paternal Grandfather    Family Psychiatric  History: Mother- Some Disorder Aunt- Bipolar Disorder, Suicide Father- EtOH abuse Both sides of family- EtOH abuse Social History:  Social History   Substance and Sexual Activity  Alcohol Use Yes   Alcohol/week: 32.0 standard drinks   Types: 12 Cans of beer, 20 Shots of liquor per week   Comment: last use 10/28/20- drinks 1/2 gallon vodka daily     Social History   Substance and Sexual Activity  Drug Use Yes   Types: Marijuana, Cocaine, Heroin, Amphetamines, Methamphetamines   Comment: history of IVDA; last use 10/28/20    Social History   Socioeconomic History   Marital status: Married    Spouse name: Not on file   Number of children: Not on file   Years of education: Not on file   Highest education level: Not on file  Occupational History   Not on file  Tobacco Use   Smoking status: Every Day    Packs/day: 2.00    Types: Cigarettes   Smokeless tobacco: Never  Vaping Use   Vaping Use: Never used  Substance and Sexual Activity   Alcohol use: Yes    Alcohol/week: 32.0 standard drinks    Types: 12 Cans of beer, 20 Shots of liquor per week    Comment: last use 10/28/20- drinks 1/2 gallon vodka daily   Drug use: Yes    Types: Marijuana, Cocaine, Heroin, Amphetamines, Methamphetamines    Comment: history of IVDA; last use 10/28/20   Sexual activity: Yes  Other Topics Concern   Not on file  Social History Narrative   Pt has been homeless for the past year and living outdoors for the past week; previously he has been living in hotels. Separated from wife. No family support.   Social Determinants of Health   Financial Resource Strain: Not on file  Food Insecurity: Not on file  Transportation Needs: Not on file  Physical Activity: Not on file  Stress: Not on file  Social  Connections: Not on file    Hospital Course: [facility] for SI and command hallucinations after attempting suicide via overdose.  He reported this was due to several psychosocial stressors including recently breaking up with his ex-wife after they tried to restart their relationship, polysubstance use, and recently being homeless after living in a hotel for a few months.  He was [facility].  He was restarted on Risperdal which he tolerated well.  He was also started on Zoloft which she tolerated well.  Unfortunately he was unable to get into a residential rehab facility, however, he was able to mend things with his ex-wife and will be going to stay with her while  he attends outpatient substance abuse programs.  He was found to have aspiration pneumonia for which she was started on clindamycin.  He tolerated this medicine well without issues and the infection improved. CXR prior to discharge showed no acute cardiopulmonary disease. He was discharged home on 8/18.   On day of discharge patient reports that he is doing well.  He reports that his sleep has been good.  He reports that his appetite has been good.  He reports no SI, HI, or AVH.  He reports that his withdrawal symptoms are most completely gone.  He reports no nausea vomiting, headache, or sweating.  He still does have some mild tremor bilaterally in his hands however it is much improved from admission.  On physical exam he has an AIMS score of 0 and no cogwheeling present.  Discussed with him the need to be able to get in contact with him when his hep C results come in.  He provided a phone number where he can be reached.  Discussed with him the need to be cautious until the final results come in.  Encouraged him to not use needles or have unprotected sex.  He reported understanding.  He will be going to stay with his ex-wife and plans to do outpatient substance abuse treatment programs as he was unsuccessful in  getting into a residential program.  Discussed with him the need to continue taking the clindamycin for 3 more days to ensure complete resolution of his pneumonia.  Cautioned him that should he have significant diarrhea he needs to see a doctor immediately.  He reported understanding.  He did ask for a few day samples of his medications which will be provided for him.  He had no other concerns.  He was discharged home.  Physical Findings: AIMS: Facial and Oral Movements Muscles of Facial Expression: None, normal Lips and Perioral Area: None, normal Jaw: None, normal Tongue: None, normal,Extremity Movements Upper (arms, wrists, hands, fingers): None, normal Lower (legs, knees, ankles, toes): None, normal, Trunk Movements Neck, shoulders, hips: None, normal, Overall Severity Severity of abnormal movements (highest score from questions above): None, normal Incapacitation due to abnormal movements: None, normal Patient's awareness of abnormal movements (rate only patient's report): No Awareness, Dental Status Current problems with teeth and/or dentures?: No Does patient usually wear dentures?: No    Musculoskeletal: Strength & Muscle Tone: within normal limits Gait & Station: normal Patient leans: N/A   Psychiatric Specialty Exam:  Presentation  General Appearance: Appropriate for Environment; Casual; Fairly Groomed  Eye Contact:Good  Speech:Clear and Coherent; Normal Rate  Speech Volume:Normal  Handedness:Right   Mood and Affect  Mood:Dysphoric; Anxious  Affect:Restricted   Thought Process  Thought Processes:Coherent; Goal Directed  Descriptions of Associations:Intact  Orientation:Full (Time, Place and Person)  Thought Content:WDL; Logical  History of Schizophrenia/Schizoaffective disorder:Yes  Duration of Psychotic Symptoms:Greater than six months  Hallucinations:Hallucinations: None  Ideas of Reference:None  Suicidal Thoughts:Suicidal Thoughts:  No  Homicidal Thoughts:Homicidal Thoughts: No   Sensorium  Memory:Immediate Fair; Recent Fair  Judgment:Fair  Insight:Fair   Executive Functions  Concentration:Good  Attention Span:Good  Recall:Good  Fund of Knowledge:Good  Language:Good   Psychomotor Activity  Psychomotor Activity:Psychomotor Activity: Normal   Assets  Assets:Communication Skills; Resilience   Sleep  Sleep:Sleep: Good Number of Hours of Sleep: 6.75    Physical Exam: Physical Exam Vitals and nursing note reviewed.  Constitutional:      General: He is not in acute distress.    Appearance: Normal appearance.  He is normal weight. He is not ill-appearing or toxic-appearing.  HENT:     Head: Normocephalic and atraumatic.  Pulmonary:     Effort: Pulmonary effort is normal.  Musculoskeletal:        General: Normal range of motion.  Neurological:     General: No focal deficit present.     Mental Status: He is alert.   Review of Systems  Respiratory:  Negative for cough and shortness of breath.   Cardiovascular:  Positive for chest pain (improved, mild).  Gastrointestinal:  Negative for abdominal pain, constipation, diarrhea, nausea and vomiting.  Neurological:  Negative for weakness and headaches.  Psychiatric/Behavioral:  Negative for depression, hallucinations and suicidal ideas. The patient is not nervous/anxious.   Blood pressure 108/71, pulse (!) 112, temperature 97.7 F (36.5 C), temperature source Oral, resp. rate 17, height  (1.753 m), weight 74.4 kg, SpO2 99 %. Body mass index is 24.22 kg/m.   Social History   Tobacco Use  Smoking Status Every Day   Packs/day: 2.00   Types: Cigarettes  Smokeless Tobacco Never   Tobacco Cessation:  A prescription for an FDA-approved tobacco cessation medication was offered at discharge and the patient refused   Blood Alcohol level:  Lab Results  Component Value Date   ETH 239 (H) 10/29/2020   ETH 18 (H) 10/25/2020    Metabolic  Disorder Labs:  Lab Results  Component Value Date   HGBA1C 5.5 10/31/2020   MPG 111.15 10/31/2020   MPG 105.41 07/17/2020   No results found for: PROLACTIN Lab Results  Component Value Date   CHOL 152 10/31/2020   TRIG 109 10/31/2020   HDL 48 10/31/2020   CHOLHDL 3.2 10/31/2020   VLDL 22 10/31/2020   LDLCALC 82 10/31/2020   LDLCALC 100 (H) 07/17/2020    See Psychiatric Specialty Exam and Suicide Risk Assessment completed by Attending Physician prior to discharge.  Discharge destination:  Home  Is patient on multiple antipsychotic therapies at discharge:  No   Has Patient had three or more failed trials of antipsychotic monotherapy by history:  No  Recommended Plan for Multiple Antipsychotic Therapies: NA   Prescriptions given at discharge. Patient agreeable to plan. Given opportunity to ask questions. Appears to feel comfortable with discharge denies any current suicidal or homicidal thought.  Patient is also instructed prior to discharge to: Take all medications as prescribed by mental healthcare provider. Report any adverse effects and or reactions from the medicines to outpatient provider promptly. Patient has been instructed & cautioned: To not engage in alcohol and or illegal drug use while on prescription medicines. In the event of worsening symptoms,  patient is instructed to call the crisis hotline, 911 and or go to the nearest ED for appropriate evaluation and treatment of symptoms. To follow-up with primary care provider for other medical issues, concerns and or health care needs  The patient was evaluated each day by a clinical provider to ascertain response to treatment. Improvement was noted by the patient's report of decreasing symptoms, improved sleep and appetite, affect, medication tolerance, behavior, and participation in unit programming.  Patient was asked each day to complete a self inventory noting mood, mental status, pain, new symptoms, anxiety and  concerns.  Patient responded well to medication and being in a therapeutic and supportive environment. Positive and appropriate behavior was noted and the patient was motivated for recovery. The patient worked closely with the treatment team and case manager to develop a discharge plan with appropriate  goals. Coping skills, problem solving as well as relaxation therapies were also part of the unit programming.  By the day of discharge patient was in much improved condition than upon admission.  Symptoms were reported as significantly decreased or resolved completely. The patient denied SI/HI and voiced no AVH. The patient was motivated to continue taking medication with a goal of continued improvement in mental health.   Patient was discharged home with a plan to follow up as noted below.  Discharge Instructions     Diet - low sodium heart healthy   Complete by: As directed    Increase activity slowly   Complete by: As directed       Allergies as of 11/04/2020       Reactions   Penicillins Other (See Comments)   Reaction occurred in childhood        Medication List     STOP taking these medications    ARIPiprazole 10 MG tablet Commonly known as: ABILIFY   doxycycline 100 MG capsule Commonly known as: VIBRAMYCIN   gabapentin 100 MG capsule Commonly known as: NEURONTIN   nicotine 21 mg/24hr patch Commonly known as: NICODERM CQ - dosed in mg/24 hours   ondansetron 4 MG disintegrating tablet Commonly known as: Zofran ODT   prazosin 2 MG capsule Commonly known as: MINIPRESS   traZODone 50 MG tablet Commonly known as: DESYREL       TAKE these medications      Indication  clindamycin 300 MG capsule Commonly known as: CLEOCIN Take 1 capsule (300 mg total) by mouth every 8 (eight) hours for 3 days.  Indication: Pneumonia   risperiDONE 1 MG tablet Commonly known as: RISPERDAL Take 1 tablet (1 mg total) by mouth daily. What changed:  when to take  this additional instructions  Indication: Schizophrenia   risperiDONE 2 MG tablet Commonly known as: RISPERDAL Take 1 tablet (2 mg total) by mouth at bedtime. What changed: You were already taking a medication with the same name, and this prescription was added. Make sure you understand how and when to take each.  Indication: Schizophrenia   sertraline 25 MG tablet Commonly known as: ZOLOFT Take 1 tablet (25 mg total) by mouth daily.  Indication: Major Depressive Disorder        Follow-up Information     [facility] Follow up.   Specialty: Addiction Medicine Why: Referral made Contact information: [address] [place] [address] [phone]         [facility] Follow up.   Why: Referral made Contact information: [address] [place] [address] [phone]         [facility]. Go to.   Specialty: Urgent Care Why: Please go to this provider for therapy and medication management services during walk in hours:  Monday through Friday, from 7:45 am to 11:00 am.  Services are provided on a first come, first served basis. Contact information: [address] [place] [address] [phone]                Follow-up recommendations:  - Activity as tolerated. - Diet as recommended by PCP. - Keep all scheduled follow-up appointments as recommended.  Comments: Patient is instructed to take all prescribed medications as recommended. Report any side effects or adverse reactions to your outpatient psychiatrist. Patient is instructed to abstain from alcohol and illegal drugs while on prescription medications. In the event of worsening symptoms, patient  is instructed to call the crisis hotline, 911, or go to the nearest emergency department for evaluation and treatment.  Signed: Lauro Franklin, MD 11/04/2020, 7:38 AM

## 2020-11-04 NOTE — Progress Notes (Signed)
  [facility] Adult Case Management Discharge Plan :  Will you be returning to the same living situation after discharge:  No. Home with Wife At discharge, do you have transportation home?: Yes,  Safe Transport  Do you have the ability to pay for your medications: Yes,  Community   Release of information consent forms completed and in the chart;  Patient's signature needed at discharge.  Patient to Follow up at:  Follow-up Information     [facility] Follow up.   Why: Referral made. You may contact this agency for admission once you have been on your medications for 14 days in a row. Contact information: [address] [place] [address] [phone]         [facility]. Go to.   Specialty: Urgent Care Why: Please go to this provider for therapy and medication management services during walk in hours:  Monday through Friday, from 7:45 am to 11:00 am.  Services are provided on a first come, first served basis. Contact information: [address] [place] [address] [phone]                Next level of care provider has access to [facility] Link:yes  Safety Planning and Suicide Prevention discussed: Yes,  with patient and wife      Has patient been referred to the Quitline?: Patient refused referral  Patient has been referred for addiction treatment: Yes  [name], LCSWA 11/04/2020, 10:21 AM

## 2020-11-04 NOTE — Progress Notes (Signed)
Discharge Note:  Pt discharged at 11:40am, left via Uber to home. Upon discharge pt is A&Ox4, is calm, cooperative, pleasant, denies SI/HI/AVH. Pt was given discharge instructions, verbalized understanding; they included pt's psych follow up appointments, medication samples provided by pharmacy, electronic prescriptions sent to pts outpt pharmacy by pt's provider, discharge medication education provided. All personal belongings returned to pt upon discharge. Pt voiced no complaints, no distress noted, none reported.
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
## 2020-11-05 NOTE — Plan of Care (Signed)
Attempted to contact patient about Hep C RNA lab results [phone]. There was no answer.     Arna Snipe MD Resident 11/05/2020

## 2020-11-22 NOTE — Plan of Care (Signed)
Patient's Hep C quant viral load lab results were sent to [facility] via confidential communicable disease report on 11/22/20 after unsuccessful attempts to contact the patient about his test results.   Bartholomew Crews, MD, Celene Skeen

## 2020-11-30 ENCOUNTER — Other Ambulatory Visit: Payer: Self-pay

## 2020-12-01 ENCOUNTER — Other Ambulatory Visit: Payer: Self-pay

## 2020-12-21 ENCOUNTER — Ambulatory Visit (INDEPENDENT_AMBULATORY_CARE_PROVIDER_SITE_OTHER): Payer: No Payment, Other | Admitting: Psychiatry

## 2020-12-21 ENCOUNTER — Other Ambulatory Visit: Payer: Self-pay

## 2020-12-21 ENCOUNTER — Encounter (HOSPITAL_COMMUNITY): Payer: Self-pay | Admitting: Psychiatry

## 2020-12-21 VITALS — BP 121/79 | HR 78 | Ht 69.0 in | Wt 179.0 lb

## 2020-12-21 DIAGNOSIS — F411 Generalized anxiety disorder: Secondary | ICD-10-CM

## 2020-12-21 DIAGNOSIS — F29 Unspecified psychosis not due to a substance or known physiological condition: Secondary | ICD-10-CM | POA: Diagnosis not present

## 2020-12-21 MED ORDER — RISPERIDONE 3 MG PO TABS
3.0000 mg | ORAL_TABLET | Freq: Every day | ORAL | 3 refills | Status: DC
  Filled 2020-12-21 – 2021-01-06 (×2): qty 30, 30d supply, fill #0
  Filled 2021-02-09 – 2021-03-07 (×2): qty 30, 30d supply, fill #1

## 2020-12-21 MED ORDER — RISPERIDONE 1 MG PO TABS
1.0000 mg | ORAL_TABLET | Freq: Every day | ORAL | 3 refills | Status: DC
  Filled 2020-12-21 – 2021-01-06 (×2): qty 30, 30d supply, fill #0
  Filled 2021-02-09 – 2021-03-07 (×2): qty 30, 30d supply, fill #1

## 2020-12-21 MED ORDER — GABAPENTIN 300 MG PO CAPS
300.0000 mg | ORAL_CAPSULE | Freq: Three times a day (TID) | ORAL | 3 refills | Status: DC
  Filled 2020-12-21 – 2021-01-06 (×2): qty 90, 30d supply, fill #0
  Filled 2021-02-09 – 2021-03-07 (×2): qty 90, 30d supply, fill #1

## 2020-12-21 MED ORDER — SERTRALINE HCL 50 MG PO TABS
50.0000 mg | ORAL_TABLET | Freq: Every day | ORAL | 3 refills | Status: DC
  Filled 2020-12-21 – 2021-01-06 (×2): qty 30, 30d supply, fill #0
  Filled 2021-02-09 – 2021-03-07 (×2): qty 30, 30d supply, fill #1
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
## 2020-12-22 ENCOUNTER — Other Ambulatory Visit: Payer: Self-pay

## 2020-12-28 ENCOUNTER — Other Ambulatory Visit: Payer: Self-pay

## 2020-12-29 ENCOUNTER — Other Ambulatory Visit: Payer: Self-pay

## 2021-01-06 ENCOUNTER — Other Ambulatory Visit: Payer: Self-pay

## 2021-01-06 ENCOUNTER — Other Ambulatory Visit (HOSPITAL_COMMUNITY): Payer: Self-pay

## 2021-01-07 ENCOUNTER — Other Ambulatory Visit: Payer: Self-pay

## 2021-02-09 ENCOUNTER — Telehealth (HOSPITAL_COMMUNITY): Payer: Self-pay | Admitting: *Deleted

## 2021-02-09 ENCOUNTER — Other Ambulatory Visit: Payer: Self-pay

## 2021-02-09 ENCOUNTER — Telehealth (HOSPITAL_COMMUNITY): Payer: Self-pay | Admitting: Psychiatry

## 2021-02-09 NOTE — Telephone Encounter (Signed)
Provider was contacted by Orpah Clinton. Reola Calkins, RN regarding patient's medication. Patient's medications were called into [facility].

## 2021-02-09 NOTE — Telephone Encounter (Signed)
Called pt at number provided. Informed pt his medications were sent to [facility] and he can pick them up there before 5 pm today.

## 2021-02-09 NOTE — Telephone Encounter (Signed)
Patient called to report [facility] is closed today. Pt has refills on file for all medications at [facility]. Pt asking if all the prescriptions can be sent to another pharmace or does he have to wait until next week after the holidays. Call pt at [phone] today.

## 2021-02-09 NOTE — Telephone Encounter (Signed)
Call to patient after getting message community pharmacy is closed today and he doesn't have any of his medications. Called [facility] to have them price out his meds for this one time and called him back with the information. He is able to fill his 4 rxs today at [facility] for 23.63 and he is able to do that. He is on his way to pick them up. Asked Eddie PA to move them as Dr Doyne Keel is not in the office today.

## 2021-02-16 ENCOUNTER — Other Ambulatory Visit: Payer: Self-pay

## 2021-02-24 ENCOUNTER — Other Ambulatory Visit: Payer: Self-pay

## 2021-02-24 ENCOUNTER — Ambulatory Visit (INDEPENDENT_AMBULATORY_CARE_PROVIDER_SITE_OTHER): Payer: No Payment, Other | Admitting: Licensed Clinical Social Worker

## 2021-02-24 DIAGNOSIS — F4310 Post-traumatic stress disorder, unspecified: Secondary | ICD-10-CM | POA: Diagnosis not present
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
## 2021-03-07 ENCOUNTER — Other Ambulatory Visit: Payer: Self-pay

## 2021-03-22 ENCOUNTER — Other Ambulatory Visit: Payer: Self-pay

## 2021-03-22 ENCOUNTER — Telehealth (INDEPENDENT_AMBULATORY_CARE_PROVIDER_SITE_OTHER): Payer: No Payment, Other | Admitting: Psychiatry

## 2021-03-22 ENCOUNTER — Encounter (HOSPITAL_COMMUNITY): Payer: Self-pay | Admitting: Psychiatry

## 2021-03-22 DIAGNOSIS — F29 Unspecified psychosis not due to a substance or known physiological condition: Secondary | ICD-10-CM

## 2021-03-22 DIAGNOSIS — F411 Generalized anxiety disorder: Secondary | ICD-10-CM

## 2021-03-22 MED ORDER — SERTRALINE HCL 100 MG PO TABS
100.0000 mg | ORAL_TABLET | Freq: Every day | ORAL | 3 refills | Status: DC
  Filled 2021-03-22 – 2021-05-06 (×3): qty 30, 30d supply, fill #0
  Filled 2021-06-10: qty 30, 30d supply, fill #1
  Filled 2021-06-29: qty 30, 30d supply, fill #2

## 2021-03-22 MED ORDER — RISPERIDONE 3 MG PO TABS
3.0000 mg | ORAL_TABLET | Freq: Every day | ORAL | 3 refills | Status: DC
  Filled 2021-03-22 – 2021-04-05 (×2): qty 30, 30d supply, fill #0

## 2021-03-22 MED ORDER — RISPERIDONE 3 MG PO TABS
3.0000 mg | ORAL_TABLET | Freq: Every day | ORAL | 3 refills | Status: DC
  Filled 2021-03-22: qty 30, 30d supply, fill #0

## 2021-03-22 MED ORDER — GABAPENTIN 300 MG PO CAPS
300.0000 mg | ORAL_CAPSULE | Freq: Three times a day (TID) | ORAL | 3 refills | Status: DC
  Filled 2021-03-22 – 2021-04-05 (×2): qty 90, 30d supply, fill #0
  Filled 2021-05-06: qty 90, 30d supply, fill #1
  Filled 2021-06-10: qty 90, 30d supply, fill #2
  Filled 2021-07-13: qty 90, 30d supply, fill #3
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
## 2021-03-23 ENCOUNTER — Encounter (HOSPITAL_COMMUNITY): Payer: Federal, State, Local not specified - Other | Admitting: Psychiatry

## 2021-04-05 ENCOUNTER — Other Ambulatory Visit: Payer: Self-pay

## 2021-04-12 ENCOUNTER — Ambulatory Visit (HOSPITAL_COMMUNITY): Payer: No Payment, Other | Admitting: Licensed Clinical Social Worker

## 2021-04-19 ENCOUNTER — Other Ambulatory Visit (HOSPITAL_COMMUNITY): Payer: Self-pay | Admitting: Psychiatry

## 2021-04-19 ENCOUNTER — Other Ambulatory Visit: Payer: Self-pay

## 2021-04-19 DIAGNOSIS — F29 Unspecified psychosis not due to a substance or known physiological condition: Secondary | ICD-10-CM

## 2021-04-19 MED ORDER — RISPERIDONE 3 MG PO TABS
3.0000 mg | ORAL_TABLET | Freq: Every day | ORAL | 3 refills | Status: DC
  Filled 2021-04-19: qty 30, 30d supply, fill #0

## 2021-04-20 ENCOUNTER — Other Ambulatory Visit (HOSPITAL_COMMUNITY): Payer: Self-pay

## 2021-04-21 ENCOUNTER — Other Ambulatory Visit: Payer: Self-pay

## 2021-04-21 ENCOUNTER — Other Ambulatory Visit (HOSPITAL_COMMUNITY): Payer: Self-pay | Admitting: Psychiatry

## 2021-04-21 ENCOUNTER — Telehealth (HOSPITAL_COMMUNITY): Payer: Self-pay | Admitting: *Deleted

## 2021-04-21 DIAGNOSIS — F29 Unspecified psychosis not due to a substance or known physiological condition: Secondary | ICD-10-CM

## 2021-04-21 MED ORDER — RISPERIDONE 3 MG PO TABS
3.0000 mg | ORAL_TABLET | Freq: Two times a day (BID) | ORAL | 3 refills | Status: DC
  Filled 2021-04-21: qty 60, 30d supply, fill #0
  Filled 2021-05-18: qty 60, 30d supply, fill #1
  Filled 2021-06-27: qty 60, 30d supply, fill #2
  Filled 2021-07-28: qty 60, 30d supply, fill #3

## 2021-04-21 NOTE — Telephone Encounter (Signed)
Patients wife called stating his rx for risperdal was not updated after his recent appt in Jan 2023 and he is in need of it. She states and note indicates he is to take 3mg  bid, but the current rx at [facility] is for 3 mg qd. Will forward concern to [facility] DNP for any needed changes.

## 2021-04-21 NOTE — Telephone Encounter (Signed)
Risperdal 3 mg twice daily ref[facility]  Patient and wife notified of refill.  No other concerns at this time

## 2021-04-22 ENCOUNTER — Other Ambulatory Visit: Payer: Self-pay

## 2021-04-29 ENCOUNTER — Other Ambulatory Visit: Payer: Self-pay

## 2021-05-04 ENCOUNTER — Ambulatory Visit (HOSPITAL_COMMUNITY): Payer: No Payment, Other | Admitting: Licensed Clinical Social Worker

## 2021-05-06 ENCOUNTER — Other Ambulatory Visit: Payer: Self-pay

## 2021-05-10 ENCOUNTER — Encounter (HOSPITAL_COMMUNITY): Payer: Self-pay

## 2021-05-10 ENCOUNTER — Ambulatory Visit (INDEPENDENT_AMBULATORY_CARE_PROVIDER_SITE_OTHER): Payer: Self-pay

## 2021-05-10 ENCOUNTER — Ambulatory Visit (HOSPITAL_COMMUNITY)
Admission: EM | Admit: 2021-05-10 | Discharge: 2021-05-10 | Disposition: A | Discharge status: Home / Self Care | Payer: Self-pay | Attending: Family Medicine | Admitting: Family Medicine

## 2021-05-10 ENCOUNTER — Other Ambulatory Visit: Payer: Self-pay

## 2021-05-10 DIAGNOSIS — R069 Unspecified abnormalities of breathing: Secondary | ICD-10-CM

## 2021-05-10 DIAGNOSIS — R42 Dizziness and giddiness: Secondary | ICD-10-CM | POA: Insufficient documentation

## 2021-05-10 DIAGNOSIS — R0602 Shortness of breath: Secondary | ICD-10-CM

## 2021-05-10 DIAGNOSIS — R5383 Other fatigue: Secondary | ICD-10-CM | POA: Insufficient documentation

## 2021-05-10 LAB — CBC WITH DIFFERENTIAL/PLATELET
Abs Immature Granulocytes: 0.02 10*3/uL (ref 0.00–0.07)
Basophils Absolute: 0 10*3/uL (ref 0.0–0.1)
Basophils Relative: 1 %
Eosinophils Absolute: 0.1 10*3/uL (ref 0.0–0.5)
Eosinophils Relative: 1 %
HCT: 41.6 % (ref 39.0–52.0)
Hemoglobin: 14.1 g/dL (ref 13.0–17.0)
Immature Granulocytes: 0 %
Lymphocytes Relative: 44 %
Lymphs Abs: 2.9 10*3/uL (ref 0.7–4.0)
MCH: 31.9 pg (ref 26.0–34.0)
MCHC: 33.9 g/dL (ref 30.0–36.0)
MCV: 94.1 fL (ref 80.0–100.0)
Monocytes Absolute: 0.9 10*3/uL (ref 0.1–1.0)
Monocytes Relative: 14 %
Neutro Abs: 2.6 10*3/uL (ref 1.7–7.7)
Neutrophils Relative %: 40 %
Platelets: 261 10*3/uL (ref 150–400)
RBC: 4.42 MIL/uL (ref 4.22–5.81)
RDW: 12.3 % (ref 11.5–15.5)
WBC: 6.5 10*3/uL (ref 4.0–10.5)
nRBC: 0 % (ref 0.0–0.2)

## 2021-05-10 LAB — POCT URINALYSIS DIPSTICK, ED / UC
Bilirubin Urine: NEGATIVE
Glucose, UA: NEGATIVE mg/dL
Hgb urine dipstick: NEGATIVE
Ketones, ur: NEGATIVE mg/dL
Leukocytes,Ua: NEGATIVE
Nitrite: NEGATIVE
Protein, ur: NEGATIVE mg/dL
Specific Gravity, Urine: 1.01 (ref 1.005–1.030)
Urobilinogen, UA: 0.2 mg/dL (ref 0.0–1.0)
pH: 7 (ref 5.0–8.0)

## 2021-05-10 LAB — COMPREHENSIVE METABOLIC PANEL
ALT: 189 U/L — ABNORMAL HIGH (ref 0–44)
AST: 105 U/L — ABNORMAL HIGH (ref 15–41)
Albumin: 4 g/dL (ref 3.5–5.0)
Alkaline Phosphatase: 67 U/L (ref 38–126)
Anion gap: 6 (ref 5–15)
BUN: 6 mg/dL (ref 6–20)
CO2: 30 mmol/L (ref 22–32)
Calcium: 9.2 mg/dL (ref 8.9–10.3)
Chloride: 100 mmol/L (ref 98–111)
Creatinine, Ser: 0.88 mg/dL (ref 0.61–1.24)
GFR, Estimated: >60 mL/min (ref >60)
Glucose, Bld: 129 mg/dL — ABNORMAL HIGH (ref 70–99)
Potassium: 3.8 mmol/L (ref 3.5–5.1)
Sodium: 136 mmol/L (ref 135–145)
Total Bilirubin: 0.5 mg/dL (ref 0.3–1.2)
Total Protein: 9.1 g/dL — ABNORMAL HIGH (ref 6.5–8.1)

## 2021-05-10 LAB — HIV ANTIBODY (ROUTINE TESTING W REFLEX): HIV Screen 4th Generation wRfx: NONREACTIVE

## 2021-05-10 NOTE — Discharge Instructions (Addendum)
You were seen today for dizziness and fatigue.  Your urine sample did not show infection.   Your chest xray was normal.  Your vitals appear normal as well.  I have ordered lab work for you today for further investigation.  If there is concern you will be contacted.  Otherwise please see your results in Mychart.   It is likely that your current symptoms may be due to recent increases of your psychiatric medication.  I do not feel comfortable making any adjustments.  I recommend you reach out to your psychiatrist for further discussion about this. In the mean time, please continue to get plenty of fluids and rest if possible.

## 2021-05-10 NOTE — ED Provider Notes (Signed)
[facility]    CSN: [id number] Arrival date & time: 05/10/21  1319      History   Chief Complaint Chief Complaint  Patient presents with   Dizziness   Fatigue    HPI Jerry [name] is a 46 y.o. male.   The past several weeks, when he is at work and he bends over and sits back up he feels dizzy.  His head feels heavy, sees stars.  In the morning he is light headed as well.  Some increased fatigue.  He is having night sweats.   No weight changes.   He has a chronic cough (he is smoker).   Some difficulty catching his breath.   He did vomit this morning, which was new for him.    Normal bms.  He is urinating okay.  He did donate his kidney in the past and urinates frequently after that.  About 1 month ago the dosage went up on both the zoloft and respiridone.  He does have a psych dr, but no pcp.    118/70 115/75 110/60  Past Medical History:  Diagnosis Date   Anxiety    Bipolar 1 disorder (HCC)    Depression    Hallucination    Paranoid schizophrenia (HCC)    since pt's 20's   PTSD (post-traumatic stress disorder)     Patient Active Problem List   Diagnosis Date Noted   Auditory hallucination    Malingering 09/07/2020   Homelessness 09/07/2020   Substance induced mood disorder (HCC) 09/07/2020   Major depressive disorder, recurrent severe without psychotic features (HCC) 07/17/2020   Schizophrenia spectrum disorder with psychotic disorder type not yet determined (HCC) 07/17/2020   Methamphetamine abuse (HCC) 07/17/2020   Marijuana abuse 07/17/2020   Cocaine abuse (HCC) 07/17/2020   Anxiety and depression 06/25/2014   Family history of diabetes mellitus (DM) 06/25/2014   Family history of thyroid disease 06/25/2014   Tobacco abuse 06/25/2014   Alcohol abuse 05/18/2013   Polysubstance abuse (HCC) 05/18/2013   Depression, unspecified 05/18/2013    Past Surgical History:  Procedure Laterality Date   KIDNEY DONATION Right 2003       Home  Medications    Prior to Admission medications   Medication Sig Start Date End Date Taking? Authorizing Provider  gabapentin (NEURONTIN) 300 MG capsule Take 1 capsule (300 mg total) by mouth 3 (three) times daily. 03/22/21   Shanna Cisco, NP  risperiDONE (RISPERDAL) 3 MG tablet Take 1 tablet (3 mg total) by mouth 2 (two) times daily. 04/21/21   Shanna Cisco, NP  sertraline (ZOLOFT) 100 MG tablet Take 1 tablet (100 mg total) by mouth daily. 03/22/21   Shanna Cisco, NP    Family History Family History  Problem Relation Age of Onset   Heart disease Mother    Cancer Mother    Heart disease Father    Diabetes Father    Diabetes Maternal Grandmother    Stroke Paternal Grandfather     Social History Social History   Tobacco Use   Smoking status: Every Day    Packs/day: 2.00    Types: Cigarettes   Smokeless tobacco: Never  Vaping Use   Vaping Use: Never used  Substance Use Topics   Alcohol use: Not Currently    Alcohol/week: 32.0 standard drinks    Types: 12 Cans of beer, 20 Shots of liquor per week    Comment: last use 10/28/20- drinks 1/2 gallon vodka daily   Drug  use: Not Currently    Types: Marijuana, Cocaine, Heroin, Amphetamines, Methamphetamines    Comment: history of IVDA; last use 10/28/20     Allergies   Penicillins   Review of Systems Review of Systems  Constitutional:  Positive for fatigue. Negative for activity change, appetite change, chills and fever.  HENT:  Negative for congestion and rhinorrhea.   Respiratory:  Positive for cough and shortness of breath.   Cardiovascular: Negative.   Gastrointestinal: Negative.   Genitourinary:  Positive for frequency.  Musculoskeletal: Negative.   Neurological:  Positive for dizziness.    Physical Exam Triage Vital Signs ED Triage Vitals  Enc Vitals Group     BP 05/10/21 1403 113/67     Pulse Rate 05/10/21 1403 (!) 56     Resp 05/10/21 1403 18     Temp 05/10/21 1403 98.2 F (36.8 C)     Temp  Source 05/10/21 1403 Oral     SpO2 05/10/21 1403 97 %     Weight --      Height --      Head Circumference --      Peak Flow --      Pain Score 05/10/21 1405 0     Pain Loc --      Pain Edu? --      Excl. in GC? --    No data found.  Updated Vital Signs BP 113/67 (BP Location: Right Arm)    Pulse (!) 56    Temp 98.2 F (36.8 C) (Oral)    Resp 18    SpO2 97%   Visual Acuity Right Eye Distance:   Left Eye Distance:   Bilateral Distance:    Right Eye Near:   Left Eye Near:    Bilateral Near:     Physical Exam Constitutional:      Appearance: Normal appearance.  HENT:     Head: Normocephalic and atraumatic.     Nose: Nose normal.     Mouth/Throat:     Mouth: Mucous membranes are moist.  Eyes:     Extraocular Movements: Extraocular movements intact.     Pupils: Pupils are equal, round, and reactive to light.  Cardiovascular:     Rate and Rhythm: Normal rate and regular rhythm.  Pulmonary:     Effort: Pulmonary effort is normal.     Breath sounds: Wheezing and rhonchi present.     Comments: Wheezing noted throughout;  rhonchi at the right lower base Abdominal:     General: Bowel sounds are normal.     Palpations: Abdomen is soft.  Musculoskeletal:     Cervical back: Normal range of motion and neck supple.  Neurological:     General: No focal deficit present.     Mental Status: He is alert and oriented to person, place, and time.  Psychiatric:        Mood and Affect: Mood normal.        Behavior: Behavior normal.   Orthostatics: 118/70 115/75 110/60  UC Treatments / Results  Labs (all labs ordered are listed, but only abnormal results are displayed) Labs Reviewed - No data to display  EKG   Radiology DG Chest 2 View  Result Date: 05/10/2021 CLINICAL DATA:  sob, abnormal breath sounds EXAM: CHEST - 2 VIEW COMPARISON:  11/03/2020. FINDINGS: No consolidation. No visible pleural effusions or pneumothorax. Biapical pleuroparenchymal scarring.  Cardiomediastinal silhouette is within normal limits and unchanged. No evidence of acute osseous abnormality. Right upper quadrant clips. Similar appearance  in comparison to prior. IMPRESSION: No evidence of acute cardiopulmonary disease. Electronically Signed   By: Feliberto Harts M.D.   On: 05/10/2021 14:44    Procedures Procedures (including critical care time)  Medications Ordered in UC Medications - No data to display  Initial Impression / Assessment and Plan / UC Course  I have reviewed the triage vital signs and the nursing notes.  Pertinent labs & imaging results that were available during my care of the patient were reviewed by me and considered in my medical decision making (see chart for details).   Patient was seen today for dizziness and fatigue.  Work up here is normal.  I have ordered blood work for further work up.  HE DOES NOT WANT HIS RESULTS TOLD TO ANYONE ELSE, ONLY HIM I believe his symptoms are likely related to recent increases of both zoloft and respirdone. I have asked him to call and discuss with his psychiatrist.  He is aware and agrees  Final Clinical Impressions(s) / UC Diagnoses   Final diagnoses:  Dizziness  Other fatigue     Discharge Instructions      You were seen today for dizziness and fatigue.  Your urine sample did not show infection.   Your chest xray was normal.  Your vitals appear normal as well.  I have ordered lab work for you today for further investigation.  If there is concern you will be contacted.  Otherwise please see your results in Mychart.   It is likely that your current symptoms may be due to recent increases of your psychiatric medication.  I do not feel comfortable making any adjustments.  I recommend you reach out to your psychiatrist for further discussion about this. In the mean time, please continue to get plenty of fluids and rest if possible.      ED Prescriptions   None    PDMP not reviewed this encounter.    Jannifer Franklin, MD 05/10/21 [phone]

## 2021-05-10 NOTE — ED Triage Notes (Signed)
Pt c/o dizzy spells twice a day for the past 2 wks with increase fatigue. States they upped the dosage of his psych meds a month ago. States dizziness is more when bending down to pick something up.

## 2021-05-18 ENCOUNTER — Ambulatory Visit: Payer: Self-pay | Admitting: Physician Assistant

## 2021-05-18 ENCOUNTER — Other Ambulatory Visit: Payer: Self-pay

## 2021-05-18 ENCOUNTER — Encounter (HOSPITAL_COMMUNITY): Payer: Self-pay | Admitting: Emergency Medicine

## 2021-05-18 ENCOUNTER — Ambulatory Visit (HOSPITAL_COMMUNITY): Payer: No Payment, Other | Admitting: Licensed Clinical Social Worker

## 2021-05-18 ENCOUNTER — Emergency Department (HOSPITAL_COMMUNITY)
Admission: EM | Admit: 2021-05-18 | Discharge: 2021-05-18 | Disposition: A | Discharge status: Home / Self Care | Payer: Self-pay | Attending: Emergency Medicine | Admitting: Emergency Medicine

## 2021-05-18 DIAGNOSIS — M5442 Lumbago with sciatica, left side: Secondary | ICD-10-CM

## 2021-05-18 DIAGNOSIS — M549 Dorsalgia, unspecified: Secondary | ICD-10-CM | POA: Insufficient documentation

## 2021-05-18 MED ORDER — PREDNISONE 10 MG PO TABS
ORAL_TABLET | Freq: Every day | ORAL | 0 refills | Status: DC
  Filled 2021-05-18: qty 42, 12d supply, fill #0

## 2021-05-18 MED ORDER — METHOCARBAMOL 500 MG PO TABS
750.0000 mg | ORAL_TABLET | Freq: Once | ORAL | Status: AC
  Administered 2021-05-18: 750 mg via ORAL
  Filled 2021-05-18: qty 2

## 2021-05-18 MED ORDER — PREDNISONE 20 MG PO TABS
60.0000 mg | ORAL_TABLET | Freq: Once | ORAL | Status: AC
  Administered 2021-05-18: 60 mg via ORAL
  Filled 2021-05-18: qty 3

## 2021-05-18 MED ORDER — METHOCARBAMOL 750 MG PO TABS
750.0000 mg | ORAL_TABLET | Freq: Four times a day (QID) | ORAL | 0 refills | Status: DC
  Filled 2021-05-18: qty 30, 8d supply, fill #0

## 2021-05-18 MED ORDER — KETOROLAC TROMETHAMINE 60 MG/2ML IM SOLN
30.0000 mg | Freq: Once | INTRAMUSCULAR | Status: AC
  Administered 2021-05-18: 30 mg via INTRAMUSCULAR
  Filled 2021-05-18: qty 2

## 2021-05-18 NOTE — ED Provider Notes (Signed)
?[facility]-EMERGENCY DEPT ?Provider Note ? ? ?CSN: [id number] ?Arrival date & time: 05/18/21  0845 ? ?  ? ?History ? ?No chief complaint on file. ? ? ?Jerry [name] is a 46 y.o. male. ? ?46 year old male with history of sciatica presents with worsening left-sided back pain.  Patient works doing Youth worker.  Denies any trauma.  Characterizes the pain as sharp and worse with certain movements.  No bowel or bladder dysfunction.  Has been using Tylenol and heating pad with limited relief.  Has not been seen by surgeon for this ? ? ?  ? ?Home Medications ?Prior to Admission medications   ?Medication Sig Start Date End Date Taking? Authorizing Provider  ?gabapentin (NEURONTIN) 300 MG capsule Take 1 capsule (300 mg total) by mouth 3 (three) times daily. 03/22/21   Shanna Cisco, NP  ?risperiDONE (RISPERDAL) 3 MG tablet Take 1 tablet (3 mg total) by mouth 2 (two) times daily. 04/21/21   Shanna Cisco, NP  ?sertraline (ZOLOFT) 100 MG tablet Take 1 tablet (100 mg total) by mouth daily. 03/22/21   Shanna Cisco, NP  ?   ? ?Allergies    ?Penicillins   ? ?Review of Systems   ?Review of Systems  ?All other systems reviewed and are negative. ? ?Physical Exam ?Updated Vital Signs ?BP 123/71 (BP Location: Right Arm)   Pulse (!) 117   Temp 97.6 ?F (36.4 ?C) (Oral)   Resp 19   Ht 1.753 m (5\' 9" )   Wt 81 kg   SpO2 98%   BMI 26.37 kg/m?  ?Physical Exam ?Vitals and nursing note reviewed.  ?Constitutional:   ?   General: He is not in acute distress. ?   Appearance: Normal appearance. He is well-developed. He is not toxic-appearing.  ?HENT:  ?   Head: Normocephalic and atraumatic.  ?Eyes:  ?   General: Lids are normal.  ?   Conjunctiva/sclera: Conjunctivae normal.  ?   Pupils: Pupils are equal, round, and reactive to light.  ?Neck:  ?   Thyroid: No thyroid mass.  ?   Trachea: No tracheal deviation.  ?Cardiovascular:  ?   Rate and Rhythm: Normal rate and regular rhythm.  ?   Heart sounds: Normal heart  sounds. No murmur heard. ?  No gallop.  ?Pulmonary:  ?   Effort: Pulmonary effort is normal. No respiratory distress.  ?   Breath sounds: Normal breath sounds. No stridor. No decreased breath sounds, wheezing, rhonchi or rales.  ?Abdominal:  ?   General: There is no distension.  ?   Palpations: Abdomen is soft.  ?   Tenderness: There is no abdominal tenderness. There is no rebound.  ?Musculoskeletal:     ?   General: No tenderness. Normal range of motion.  ?   Cervical back: Normal range of motion and neck supple.  ?     Back: ? ?Skin: ?   General: Skin is warm and dry.  ?   Findings: No abrasion or rash.  ?Neurological:  ?   General: No focal deficit present.  ?   Mental Status: He is alert and oriented to person, place, and time. Mental status is at baseline.  ?   GCS: GCS eye subscore is 4. GCS verbal subscore is 5. GCS motor subscore is 6.  ?   Cranial Nerves: No cranial nerve deficit.  ?   Sensory: No sensory deficit.  ?   Motor: Motor function is intact.  ?  Gait: Gait is intact.  ?   Comments: No foot drop with ambulation.  Strength 5/5 throughout  ?Psychiatric:     ?   Attention and Perception: Attention normal.     ?   Speech: Speech normal.     ?   Behavior: Behavior normal.  ? ? ?ED Results / Procedures / Treatments   ?Labs ?(all labs ordered are listed, but only abnormal results are displayed) ?Labs Reviewed - No data to display ? ?EKG ?None ? ?Radiology ?No results found. ? ?Procedures ?Procedures  ? ? ?Medications Ordered in ED ?Medications  ?predniSONE (DELTASONE) tablet 60 mg (has no administration in time range)  ?methocarbamol (ROBAXIN) tablet 750 mg (has no administration in time range)  ?ketorolac (TORADOL) injection 30 mg (has no administration in time range)  ? ? ?ED Course/ Medical Decision Making/ A&P ?  ?                        ?Medical Decision Making ?Risk ?Prescription drug management. ? ? ?Patient presented with symptoms of sciatica.  No red flags for cauda equina.  No concern for  infectious etiology.  No neurological deficits.  Do not feel that imaging is required at this time.  Patient's been treated with oral as well as IM medications here.  Will place patient on prednisone taper as well as refer to primary care. ? ? ? ? ? ? ? ?Final Clinical Impression(s) / ED Diagnoses ?Final diagnoses:  ?None  ? ? ?Rx / DC Orders ?ED Discharge Orders   ? ? None  ? ?  ? ? ?  ?Lorre Nick, MD ?05/18/21 [phone] ? ?

## 2021-05-18 NOTE — ED Triage Notes (Signed)
Reports hx of herniated disc and sciatic nerve pain, states his L lower back has been hurting since Monday morning, tried heating pad last night, has superficial burn from this. Denies recent falls or injuries. Reports pain radiates down his R leg.  ?

## 2021-05-19 ENCOUNTER — Other Ambulatory Visit: Payer: Self-pay

## 2021-05-24 ENCOUNTER — Other Ambulatory Visit: Payer: Self-pay

## 2021-06-02 ENCOUNTER — Ambulatory Visit (HOSPITAL_COMMUNITY): Payer: No Payment, Other | Admitting: Licensed Clinical Social Worker

## 2021-06-02 ENCOUNTER — Encounter (HOSPITAL_COMMUNITY): Payer: Self-pay

## 2021-06-06 ENCOUNTER — Telehealth (HOSPITAL_COMMUNITY): Payer: No Payment, Other | Admitting: Psychiatry

## 2021-06-09 ENCOUNTER — Emergency Department (HOSPITAL_COMMUNITY)
Admission: EM | Admit: 2021-06-09 | Discharge: 2021-06-09 | Disposition: A | Discharge status: Home / Self Care | Payer: Self-pay | Attending: Emergency Medicine | Admitting: Emergency Medicine

## 2021-06-09 ENCOUNTER — Emergency Department (HOSPITAL_COMMUNITY): Payer: Self-pay

## 2021-06-09 ENCOUNTER — Other Ambulatory Visit: Payer: Self-pay

## 2021-06-09 ENCOUNTER — Encounter (HOSPITAL_COMMUNITY): Payer: Self-pay

## 2021-06-09 DIAGNOSIS — W108XXA Fall (on) (from) other stairs and steps, initial encounter: Secondary | ICD-10-CM

## 2021-06-09 DIAGNOSIS — S0990XA Unspecified injury of head, initial encounter: Secondary | ICD-10-CM | POA: Insufficient documentation

## 2021-06-09 DIAGNOSIS — R109 Unspecified abdominal pain: Secondary | ICD-10-CM | POA: Insufficient documentation

## 2021-06-09 DIAGNOSIS — W109XXA Fall (on) (from) unspecified stairs and steps, initial encounter: Secondary | ICD-10-CM | POA: Insufficient documentation

## 2021-06-09 DIAGNOSIS — M545 Low back pain, unspecified: Secondary | ICD-10-CM | POA: Insufficient documentation

## 2021-06-09 DIAGNOSIS — S2242XA Multiple fractures of ribs, left side, initial encounter for closed fracture: Secondary | ICD-10-CM | POA: Insufficient documentation

## 2021-06-09 LAB — I-STAT CHEM 8, ED
BUN: 12 mg/dL (ref 6–20)
Calcium, Ion: 1.07 mmol/L — ABNORMAL LOW (ref 1.15–1.40)
Chloride: 105 mmol/L (ref 98–111)
Creatinine, Ser: 0.8 mg/dL (ref 0.61–1.24)
Glucose, Bld: 86 mg/dL (ref 70–99)
HCT: 42 % (ref 39.0–52.0)
Hemoglobin: 14.3 g/dL (ref 13.0–17.0)
Potassium: 4.8 mmol/L (ref 3.5–5.1)
Sodium: 137 mmol/L (ref 135–145)
TCO2: 25 mmol/L (ref 22–32)

## 2021-06-09 MED ORDER — IOHEXOL 300 MG/ML  SOLN
100.0000 mL | Freq: Once | INTRAMUSCULAR | Status: AC | PRN
  Administered 2021-06-09: 100 mL via INTRAVENOUS

## 2021-06-09 MED ORDER — SODIUM CHLORIDE (PF) 0.9 % IJ SOLN
INTRAMUSCULAR | Status: AC
  Filled 2021-06-09: qty 50

## 2021-06-09 MED ORDER — OXYCODONE-ACETAMINOPHEN 5-325 MG PO TABS
2.0000 | ORAL_TABLET | Freq: Once | ORAL | Status: AC
  Administered 2021-06-09: 2 via ORAL
  Filled 2021-06-09: qty 2

## 2021-06-09 NOTE — ED Notes (Signed)
Attempt IV placement in left AC and right wrist without success.  Second RN to attempt placement ?

## 2021-06-09 NOTE — ED Notes (Signed)
RN and charge RN attempted IV placement.  IV placed utilizing ultrasound ?

## 2021-06-09 NOTE — ED Notes (Signed)
Patient transported to CT 

## 2021-06-09 NOTE — ED Triage Notes (Signed)
Pt reports falling on Friday and now endorses low back pain and left sided rib pain. Pt reports hx of back problems,but but reports worsening pain now.  ?
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
## 2021-06-09 NOTE — ED Notes (Signed)
Pt given IS and teaching and return demonstration done.  Advised pt to do IS 10x an hour while awake.  Pt left ambulatory ?

## 2021-06-09 NOTE — ED Provider Notes (Signed)
?[facility]-EMERGENCY DEPT ?Provider Note ? ? ?CSN: [id number] ?Arrival date & time: 06/09/21  4098 ? ?  ? ?History ? ?Chief Complaint  ?Patient presents with  ? Fall  ? Back Pain  ? ? ?NOTNAMED Jerry [name] is a 46 y.o. male. ? ?HPI ?46 yo male presents today complaining of pain after fall last Friday.  Patient states he fell down multiple steps.  He thinks he hit his head but denies loc.  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  Patient states he has left side rib cage pain, and back pain. No numbness tingling or weakness. ? ? ?  ? ?Home Medications ?Prior to Admission medications   ?Medication Sig Start Date End Date Taking? Authorizing Provider  ?gabapentin (NEURONTIN) 300 MG capsule Take 1 capsule (300 mg total) by mouth 3 (three) times daily. 03/22/21   Shanna Cisco, NP  ?methocarbamol (ROBAXIN-750) 750 MG tablet Take 1 tablet (750 mg total) by mouth 4 (four) times daily. 05/18/21   Lorre Nick, MD  ?predniSONE (DELTASONE) 10 MG tablet Take 6 tabs by mouth daily for 2 days, then 5 tabs for 2 days, then 4 tabs for 2 days, then 3 tabs for 2 days, 2 tabs for 2 days, then 1 tab by mouth daily for 2 days 05/18/21   Lorre Nick, MD  ?risperiDONE (RISPERDAL) 3 MG tablet Take 1 tablet (3 mg total) by mouth 2 (two) times daily. 04/21/21   Shanna Cisco, NP  ?sertraline (ZOLOFT) 100 MG tablet Take 1 tablet (100 mg total) by mouth daily. 03/22/21   Shanna Cisco, NP  ?   ? ?Allergies    ?Penicillins   ? ?Review of Systems   ?Review of Systems  ?All other systems reviewed and are negative. ? ?Physical Exam ?Updated Vital Signs ?BP 123/73   Pulse (!) 51   Temp 97.8 ?F (36.6 ?C) (Oral)   Resp (!) 29   SpO2 97%  ?Physical Exam ?Vitals and nursing note reviewed.  ?Constitutional:   ?   Appearance: Normal appearance.  ?HENT:  ?   Head: Normocephalic and atraumatic.  ?   Right Ear: External ear normal.  ?   Left Ear: External ear normal.  ?   Nose: Nose normal.  ?    Mouth/Throat:  ?   Mouth: Mucous membranes are moist.  ?   Pharynx: Oropharynx is clear.  ?Eyes:  ?   Comments: Right eye tracks, left eye disconjugate  ?Cardiovascular:  ?   Rate and Rhythm: Normal rate and regular rhythm.  ?   Pulses: Normal pulses.  ?   Heart sounds: Normal heart sounds.  ?Pulmonary:  ?   Effort: Pulmonary effort is normal.  ?   Breath sounds: Normal breath sounds.  ?Abdominal:  ?   Palpations: Abdomen is soft.  ?   Tenderness: There is abdominal tenderness.  ?   Comments: Lug ttp  ?Musculoskeletal:     ?   General: Normal range of motion.  ?   Cervical back: Normal range of motion and neck supple.  ?   Comments: No point ttp over spine-c,t, and l  ?Skin: ?   General: Skin is warm and dry.  ?   Capillary Refill: Capillary refill takes less than 2 seconds.  ?Neurological:  ?   General: No focal deficit present.  ?   Mental Status: He is alert.  ?   Cranial Nerves: No cranial nerve deficit.  ?  Sensory: No sensory deficit.  ?   Motor: No weakness.  ?   Coordination: Coordination normal.  ?   Gait: Gait normal.  ?   Deep Tendon Reflexes: Reflexes normal.  ?Psychiatric:     ?   Mood and Affect: Mood normal.  ? ? ?ED Results / Procedures / Treatments   ?Labs ?(all labs ordered are listed, but only abnormal results are displayed) ?Labs Reviewed  ?I-STAT CHEM 8, ED - Abnormal; Notable for the following components:  ?    Result Value  ? Calcium, Ion 1.07 (*)   ? All other components within normal limits  ? ? ?EKG ?None ? ?Radiology ?CT Head Wo Contrast ? ?Result Date: 06/09/2021 ?CLINICAL DATA:  Head trauma, moderate to severe EXAM: CT HEAD WITHOUT CONTRAST CT CERVICAL SPINE WITHOUT CONTRAST TECHNIQUE: Multidetector CT imaging of the head and cervical spine was performed following the standard protocol without intravenous contrast. Multiplanar CT image reconstructions of the cervical spine were also generated. RADIATION DOSE REDUCTION: This exam was performed according to the departmental  dose-optimization program which includes automated exposure control, adjustment of the mA and/or kV according to patient size and/or use of iterative reconstruction technique. COMPARISON:  08/06/2020 FINDINGS: CT HEAD FINDINGS Brain: No evidence of swelling, infarction, hemorrhage, hydrocephalus, extra-axial collection or mass lesion/mass effect. Vascular: No hyperdense vessel or unexpected calcification. Skull: Normal. Negative for fracture or focal lesion. Sinuses/Orbits: No traumatic finding.  Patchy sinus opacification. CT CERVICAL SPINE FINDINGS Alignment: Normal. Skull base and vertebrae: No acute fracture. No primary bone lesion or focal pathologic process. Soft tissues and spinal canal: No prevertebral fluid or swelling. No visible canal hematoma. Disc levels: Cervical endplate spurring with bridging osteophytes at C5-6 and C6-7. Multilevel uncovertebral spurring encroaching on the foramina. Endplate spurring causes notable right foraminal impingement at T1-2. Upper chest: Reported separately IMPRESSION: No evidence of acute intracranial or cervical spine injury. Electronically Signed   By: Tiburcio PeaJonathan  Watts M.D.   On: 06/09/2021 11:03  ? ?CT Chest W Contrast ? ?Result Date: 06/09/2021 ?CLINICAL DATA:  Fall on Friday.  Left rib pain EXAM: CT CHEST, ABDOMEN, AND PELVIS WITH CONTRAST TECHNIQUE: Multidetector CT imaging of the chest, abdomen and pelvis was performed following the standard protocol during bolus administration of intravenous contrast. RADIATION DOSE REDUCTION: This exam was performed according to the departmental dose-optimization program which includes automated exposure control, adjustment of the mA and/or kV according to patient size and/or use of iterative reconstruction technique. CONTRAST:  100mL OMNIPAQUE IOHEXOL 300 MG/ML  SOLN COMPARISON:  Renal CT 10/27/2020 FINDINGS: CT CHEST FINDINGS Cardiovascular: Normal heart size. No pericardial effusion or evidence of great vessel injury. Coronary  atherosclerosis along the LAD, notable for age Mediastinum/Nodes: No hematoma or pneumomediastinum. Lungs/Pleura: Central airways are clear. Dependent atelectasis, no suspected superimposed contusion. No hemothorax or pneumothorax. Paraseptal emphysema at the apices Musculoskeletal: Nondisplaced left posterior eleventh and twelfth rib fractures. CT ABDOMEN PELVIS FINDINGS Hepatobiliary: No hepatic injury or perihepatic hematoma. Gallbladder is unremarkable. Pancreas: Negative Spleen: No splenic injury or perisplenic hematoma. Adrenals/Urinary Tract: No adrenal hemorrhage or renal injury identified. Bladder is unremarkable. Right nephrectomy, reportedly for organ donation per prior imaging report Stomach/Bowel: No evidence of injury Vascular/Lymphatic: Atheromatous calcification of the aorta and iliacs, notable for patient age. Reproductive: Unremarkable Other: No ascites or pneumoperitoneum.  Fatty left inguinal hernia. Musculoskeletal: Lumbar spine findings described on dedicated reformats. No acute fracture or subluxation. IMPRESSION: 1. Nondisplaced eleventh and twelfth rib fractures on the left. 2. Notable atherosclerosis for  age, including the coronaries. 3. Fatty left inguinal hernia. Electronically Signed   By: Tiburcio Pea M.D.   On: 06/09/2021 11:08  ? ?CT Cervical Spine Wo Contrast ? ?Result Date: 06/09/2021 ?CLINICAL DATA:  Head trauma, moderate to severe EXAM: CT HEAD WITHOUT CONTRAST CT CERVICAL SPINE WITHOUT CONTRAST TECHNIQUE: Multidetector CT imaging of the head and cervical spine was performed following the standard protocol without intravenous contrast. Multiplanar CT image reconstructions of the cervical spine were also generated. RADIATION DOSE REDUCTION: This exam was performed according to the departmental dose-optimization program which includes automated exposure control, adjustment of the mA and/or kV according to patient size and/or use of iterative reconstruction technique. COMPARISON:   08/06/2020 FINDINGS: CT HEAD FINDINGS Brain: No evidence of swelling, infarction, hemorrhage, hydrocephalus, extra-axial collection or mass lesion/mass effect. Vascular: No hyperdense vessel or unexpected calcification. Skull: N

## 2021-06-10 ENCOUNTER — Other Ambulatory Visit: Payer: Self-pay

## 2021-06-27 ENCOUNTER — Other Ambulatory Visit: Payer: Self-pay

## 2021-06-28 ENCOUNTER — Other Ambulatory Visit: Payer: Self-pay

## 2021-06-29 ENCOUNTER — Other Ambulatory Visit: Payer: Self-pay

## 2021-06-30 ENCOUNTER — Other Ambulatory Visit: Payer: Self-pay

## 2021-07-13 ENCOUNTER — Other Ambulatory Visit (HOSPITAL_COMMUNITY): Payer: Self-pay | Admitting: Psychiatry

## 2021-07-13 ENCOUNTER — Other Ambulatory Visit: Payer: Self-pay

## 2021-07-13 DIAGNOSIS — F29 Unspecified psychosis not due to a substance or known physiological condition: Secondary | ICD-10-CM

## 2021-07-13 DIAGNOSIS — F411 Generalized anxiety disorder: Secondary | ICD-10-CM

## 2021-07-14 ENCOUNTER — Other Ambulatory Visit: Payer: Self-pay

## 2021-07-16 ENCOUNTER — Emergency Department (HOSPITAL_COMMUNITY): Payer: Self-pay

## 2021-07-16 ENCOUNTER — Encounter (HOSPITAL_COMMUNITY): Payer: Self-pay

## 2021-07-16 ENCOUNTER — Emergency Department (HOSPITAL_COMMUNITY)
Admission: EM | Admit: 2021-07-16 | Discharge: 2021-07-16 | Disposition: A | Discharge status: Home / Self Care | Payer: Self-pay | Attending: Emergency Medicine | Admitting: Emergency Medicine

## 2021-07-16 DIAGNOSIS — M5442 Lumbago with sciatica, left side: Secondary | ICD-10-CM | POA: Insufficient documentation

## 2021-07-16 DIAGNOSIS — Z20822 Contact with and (suspected) exposure to covid-19: Secondary | ICD-10-CM | POA: Insufficient documentation

## 2021-07-16 DIAGNOSIS — M5126 Other intervertebral disc displacement, lumbar region: Secondary | ICD-10-CM | POA: Insufficient documentation

## 2021-07-16 DIAGNOSIS — R1032 Left lower quadrant pain: Secondary | ICD-10-CM | POA: Insufficient documentation

## 2021-07-16 LAB — I-STAT CHEM 8, ED
BUN: 11 mg/dL (ref 6–20)
Calcium, Ion: 1.11 mmol/L — ABNORMAL LOW (ref 1.15–1.40)
Chloride: 100 mmol/L (ref 98–111)
Creatinine, Ser: 1.2 mg/dL (ref 0.61–1.24)
Glucose, Bld: 117 mg/dL — ABNORMAL HIGH (ref 70–99)
HCT: 43 % (ref 39.0–52.0)
Hemoglobin: 14.6 g/dL (ref 13.0–17.0)
Potassium: 4.3 mmol/L (ref 3.5–5.1)
Sodium: 135 mmol/L (ref 135–145)
TCO2: 25 mmol/L (ref 22–32)

## 2021-07-16 LAB — CBC
HCT: 40.8 % (ref 39.0–52.0)
Hemoglobin: 14.2 g/dL (ref 13.0–17.0)
MCH: 32.3 pg (ref 26.0–34.0)
MCHC: 34.8 g/dL (ref 30.0–36.0)
MCV: 92.9 fL (ref 80.0–100.0)
Platelets: 257 10*3/uL (ref 150–400)
RBC: 4.39 MIL/uL (ref 4.22–5.81)
RDW: 13.1 % (ref 11.5–15.5)
WBC: 11.5 10*3/uL — ABNORMAL HIGH (ref 4.0–10.5)
nRBC: 0 % (ref 0.0–0.2)

## 2021-07-16 LAB — RESP PANEL BY RT-PCR (FLU A&B, COVID) ARPGX2
Influenza A by PCR: NEGATIVE
Influenza B by PCR: NEGATIVE
SARS Coronavirus 2 by RT PCR: NEGATIVE

## 2021-07-16 LAB — COMPREHENSIVE METABOLIC PANEL
ALT: 157 U/L — ABNORMAL HIGH (ref 0–44)
AST: 147 U/L — ABNORMAL HIGH (ref 15–41)
Albumin: 4.1 g/dL (ref 3.5–5.0)
Alkaline Phosphatase: 98 U/L (ref 38–126)
Anion gap: 8 (ref 5–15)
BUN: 11 mg/dL (ref 6–20)
CO2: 22 mmol/L (ref 22–32)
Calcium: 8.8 mg/dL — ABNORMAL LOW (ref 8.9–10.3)
Chloride: 103 mmol/L (ref 98–111)
Creatinine, Ser: 0.89 mg/dL (ref 0.61–1.24)
GFR, Estimated: >60 mL/min (ref >60)
Glucose, Bld: 118 mg/dL — ABNORMAL HIGH (ref 70–99)
Potassium: 3.4 mmol/L — ABNORMAL LOW (ref 3.5–5.1)
Sodium: 133 mmol/L — ABNORMAL LOW (ref 135–145)
Total Bilirubin: 0.7 mg/dL (ref 0.3–1.2)
Total Protein: 9.2 g/dL — ABNORMAL HIGH (ref 6.5–8.1)

## 2021-07-16 LAB — PROTIME-INR
INR: 1.1 (ref 0.8–1.2)
Prothrombin Time: 13.7 seconds (ref 11.4–15.2)

## 2021-07-16 LAB — URINALYSIS, ROUTINE W REFLEX MICROSCOPIC
Bilirubin Urine: NEGATIVE
Glucose, UA: NEGATIVE mg/dL
Ketones, ur: NEGATIVE mg/dL
Leukocytes,Ua: NEGATIVE
Nitrite: NEGATIVE
Protein, ur: NEGATIVE mg/dL
Specific Gravity, Urine: 1.006 (ref 1.005–1.030)
pH: 6 (ref 5.0–8.0)

## 2021-07-16 LAB — RAPID URINE DRUG SCREEN, HOSP PERFORMED
Amphetamines: POSITIVE — AB
Barbiturates: NOT DETECTED
Benzodiazepines: NOT DETECTED
Cocaine: POSITIVE — AB
Opiates: NOT DETECTED
Tetrahydrocannabinol: POSITIVE — AB

## 2021-07-16 LAB — ETHANOL: Alcohol, Ethyl (B): 270 mg/dL — ABNORMAL HIGH (ref <10)

## 2021-07-16 MED ORDER — METHOCARBAMOL 750 MG PO TABS
750.0000 mg | ORAL_TABLET | Freq: Three times a day (TID) | ORAL | 0 refills | Status: AC | PRN
  Filled 2021-07-16: qty 21, 7d supply, fill #0

## 2021-07-16 MED ORDER — IOHEXOL 300 MG/ML  SOLN
100.0000 mL | Freq: Once | INTRAMUSCULAR | Status: AC | PRN
  Administered 2021-07-16: 100 mL via INTRAVENOUS

## 2021-07-16 MED ORDER — ACETAMINOPHEN 500 MG PO TABS
500.0000 mg | ORAL_TABLET | Freq: Once | ORAL | Status: AC
  Administered 2021-07-16: 500 mg via ORAL
  Filled 2021-07-16: qty 1

## 2021-07-16 MED ORDER — HYDROCODONE-ACETAMINOPHEN 5-325 MG PO TABS
2.0000 | ORAL_TABLET | Freq: Once | ORAL | Status: AC
  Administered 2021-07-16: 2 via ORAL
  Filled 2021-07-16: qty 2

## 2021-07-16 MED ORDER — PREDNISONE 10 MG PO TABS
ORAL_TABLET | Freq: Every day | ORAL | 0 refills | Status: DC
  Filled 2021-07-16: qty 42, 12d supply, fill #0

## 2021-07-16 MED ORDER — POTASSIUM CHLORIDE CRYS ER 20 MEQ PO TBCR
20.0000 meq | EXTENDED_RELEASE_TABLET | Freq: Once | ORAL | Status: AC
  Administered 2021-07-16: 20 meq via ORAL
  Filled 2021-07-16: qty 1

## 2021-07-16 NOTE — ED Notes (Signed)
Pt up and out of room, states he wants to leave.  Pt pulled out his own IV.  This RN was getting pt something to eat/drink after confirming patient was able to have something.  Pt back in bed, states he will stay after getting drink/food.  PIV site clean and dry. ?

## 2021-07-16 NOTE — ED Notes (Signed)
Pt provided with urinal and sample requested. Pt urinated on floor and in other cup instead of urinal. Will attempt again later. ?
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
## 2021-07-16 NOTE — Discharge Instructions (Addendum)
Your MRI showed some disc protrusion.  This can press on some nerves and cause irritation and pain.  You do not need anything done at the moment.  You should continue to treat your pain with Tylenol and ibuprofen.  Additionally, there were a prescription sent for a steroid taper and a muscle relaxer.  Take the muscle relaxer only as needed.  If you have persistent pain beyond the next several weeks, call the number below to be seen by a neurosurgeon to talk about further management options. ?
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
## 2021-07-18 ENCOUNTER — Other Ambulatory Visit: Payer: Self-pay

## 2021-07-20 ENCOUNTER — Telehealth (HOSPITAL_COMMUNITY): Payer: No Payment, Other | Admitting: Psychiatry

## 2021-07-25 ENCOUNTER — Ambulatory Visit (HOSPITAL_COMMUNITY): Payer: No Payment, Other | Admitting: Licensed Clinical Social Worker

## 2021-07-26 ENCOUNTER — Telehealth (HOSPITAL_COMMUNITY): Payer: No Payment, Other | Admitting: Psychiatry

## 2021-07-29 ENCOUNTER — Other Ambulatory Visit (HOSPITAL_COMMUNITY): Payer: Self-pay | Admitting: Psychiatry

## 2021-07-29 ENCOUNTER — Other Ambulatory Visit: Payer: Self-pay

## 2021-07-29 DIAGNOSIS — F411 Generalized anxiety disorder: Secondary | ICD-10-CM

## 2021-07-29 DIAGNOSIS — F29 Unspecified psychosis not due to a substance or known physiological condition: Secondary | ICD-10-CM

## 2021-08-01 ENCOUNTER — Other Ambulatory Visit: Payer: Self-pay

## 2021-08-01 ENCOUNTER — Telehealth (INDEPENDENT_AMBULATORY_CARE_PROVIDER_SITE_OTHER): Payer: No Payment, Other | Admitting: Psychiatry

## 2021-08-01 ENCOUNTER — Telehealth (HOSPITAL_COMMUNITY): Payer: Self-pay | Admitting: *Deleted

## 2021-08-01 DIAGNOSIS — F411 Generalized anxiety disorder: Secondary | ICD-10-CM | POA: Diagnosis not present

## 2021-08-01 DIAGNOSIS — F29 Unspecified psychosis not due to a substance or known physiological condition: Secondary | ICD-10-CM | POA: Diagnosis not present

## 2021-08-01 MED ORDER — RISPERIDONE 3 MG PO TABS: 3.0000 mg | ORAL_TABLET | Freq: Two times a day (BID) | ORAL | 0 refills | Status: DC

## 2021-08-01 MED ORDER — GABAPENTIN 300 MG PO CAPS
300.0000 mg | ORAL_CAPSULE | Freq: Three times a day (TID) | ORAL | 0 refills | Status: DC
  Filled 2021-08-01 – 2021-08-22 (×2): qty 90, 30d supply, fill #0

## 2021-08-01 MED ORDER — TRAZODONE HCL 50 MG PO TABS
50.0000 mg | ORAL_TABLET | Freq: Every day | ORAL | 0 refills | Status: DC
  Filled 2021-08-01: qty 30, 30d supply, fill #0

## 2021-08-01 MED ORDER — SERTRALINE HCL 100 MG PO TABS
150.0000 mg | ORAL_TABLET | Freq: Every day | ORAL | 0 refills | Status: DC
  Filled 2021-08-01: qty 45, 30d supply, fill #0

## 2021-08-01 NOTE — Telephone Encounter (Signed)
Patient called for refill of Zoloft and Risperdal. Explained that he has an appointment today at 4pm and they will be filled at that time. Nothing further needed. ?

## 2021-08-01 NOTE — Progress Notes (Signed)
BH MD/PA/NP OP Progress Note ? ?08/01/2021 2:54 PM ?Jerry [name]  ?MRN:  [id number] ? ?Virtual Visit via Telephone Note ? ?I connected with Jerry [name] on 08/01/21 at  4:00 PM EDT by telephone and verified that I am speaking with the correct person using two identifiers. ? ?Location: ?Patient: Home ?Provider: offsite ?  ?I discussed the limitations, risks, security and privacy concerns of performing an evaluation and management service by telephone and the availability of in person appointments. I also discussed with the patient that there may be a patient responsible charge related to this service. The patient expressed understanding and agreed to proceed. ? ? ?  ?I discussed the assessment and treatment plan with the patient. The patient was provided an opportunity to ask questions and all were answered. The patient agreed with the plan and demonstrated an understanding of the instructions. ?  ?The patient was advised to call back or seek an in-person evaluation if the symptoms worsen or if the condition fails to improve as anticipated. ? ?I provided 10 minutes of non-face-to-face time during this encounter. ? ? ?Mcneil Sober, NP  ? ?Chief Complaint: Medication management ? ? ? ?HPI: Jerry [name] is a 46 year old male presenting to [facility] behavioral health outpatient for follow-up psychiatric evaluation.  Patient is being seen by this psychiatric provider due to her attending psychiatric provider being unavailable to complete today's appointment.  He has a psychiatric history of major depressive disorder, anxiety and schizophrenia spectrum disorder.  His symptoms are managed with trazodone 50 mg daily at bedtime, sertraline 100 mg daily, Risperdal 3 mg twice daily, and gabapentin 300 mg 3 times daily.  Patient reports medication compliance and denies adverse medication effects.  Patient also reports increased anxiety and insomnia.  Patient states that he previously was prescribed trazodone which was  effective in the past.  Patient is agreeable to restarting trazodone for sleep.  Patient is also agreeable to increasing Zoloft to manage anxiety and depressive symptoms.  Patient states that he has been feeling down recently with depressed mood.  Medication benefits versus risk discussed.  New order for trazodone 50 mg at bedtime as needed for sleep completed and Zoloft increased to 150 mg daily.  Resporal and gabapentin refilled at current dosages. ? ? ? ?Visit Diagnosis:  ?  ICD-10-CM   ?1. Schizophrenia spectrum disorder with psychotic disorder type not yet determined (HCC)  F29 risperiDONE (RISPERDAL) 3 MG tablet  ?  ?2. Generalized anxiety disorder  F41.1 gabapentin (NEURONTIN) 300 MG capsule  ?  ? ? ?Past Psychiatric History: Generalized anxiety disorder and schizophrenia spectrum disorder and major depressive disorder ? ?Past Medical History:  ?Past Medical History:  ?Diagnosis Date  ?? Anxiety   ?? Bipolar 1 disorder (HCC)   ?? Depression   ?? Hallucination   ?? Paranoid schizophrenia (HCC)   ? since pt's 20's  ?? PTSD (post-traumatic stress disorder)   ?  ?Past Surgical History:  ?Procedure Laterality Date  ?? KIDNEY DONATION Right 2003  ? ? ?Family Psychiatric History: N/A ? ?Family History:  ?Family History  ?Problem Relation Age of Onset  ?? Heart disease Mother   ?? Cancer Mother   ?? Heart disease Father   ?? Diabetes Father   ?? Diabetes Maternal Grandmother   ?? Stroke Paternal Grandfather   ? ? ?Social History:  ?Social History  ? ?Socioeconomic History  ?? Marital status: Married  ?  Spouse name: Not on file  ?? Number of children: Not  on file  ?? Years of education: Not on file  ?? Highest education level: Not on file  ?Occupational History  ?? Not on file  ?Tobacco Use  ?? Smoking status: Every Day  ?  Packs/day: 2.00  ?  Types: Cigarettes  ?? Smokeless tobacco: Never  ?Vaping Use  ?? Vaping Use: Never used  ?Substance and Sexual Activity  ?? Alcohol use: Yes  ?  Alcohol/week: 32.0 standard  drinks  ?  Types: 12 Cans of beer, 20 Shots of liquor per week  ?  Comment: last use 10/28/20- drinks 1/2 gallon vodka daily  ?? Drug use: Not Currently  ?  Types: Marijuana, Cocaine, Heroin, Amphetamines, Methamphetamines  ?  Comment: history of IVDA; last use 10/28/20  ?? Sexual activity: Yes  ?Other Topics Concern  ?? Not on file  ?Social History Narrative  ? Pt has been homeless for the past year and living outdoors for the past week; previously he has been living in hotels. Separated from wife. No family support.  ? ?Social Determinants of Health  ? ?Financial Resource Strain: Not on file  ?Food Insecurity: Not on file  ?Transportation Needs: Not on file  ?Physical Activity: Not on file  ?Stress: Not on file  ?Social Connections: Not on file  ? ? ?Allergies:  ?Allergies  ?Allergen Reactions  ?? Penicillins Other (See Comments)  ?  Reaction occurred in childhood  ? ? ?Metabolic Disorder Labs: ?Lab Results  ?Component Value Date  ? HGBA1C 5.5 10/31/2020  ? MPG 111.15 10/31/2020  ? MPG 105.41 07/17/2020  ? ?No results found for: PROLACTIN ?Lab Results  ?Component Value Date  ? CHOL 152 10/31/2020  ? TRIG 109 10/31/2020  ? HDL 48 10/31/2020  ? CHOLHDL 3.2 10/31/2020  ? VLDL 22 10/31/2020  ? LDLCALC 82 10/31/2020  ? LDLCALC 100 (H) 07/17/2020  ? ?Lab Results  ?Component Value Date  ? TSH 0.751 07/17/2020  ? TSH 1.503 06/25/2014  ? ? ?Therapeutic Level Labs: ?No results found for: LITHIUM ?No results found for: VALPROATE ?No components found for:  CBMZ ? ?Current Medications: ?Current Outpatient Medications  ?Medication Sig Dispense Refill  ?? sertraline (ZOLOFT) 100 MG tablet Take 1.5 tablets (150 mg total) by mouth daily. 45 tablet 0  ?? traZODone (DESYREL) 50 MG tablet Take 1 tablet (50 mg total) by mouth at bedtime. 30 tablet 0  ?? gabapentin (NEURONTIN) 300 MG capsule Take 1 capsule (300 mg total) by mouth 3 (three) times daily. 90 capsule 0  ?? predniSONE (DELTASONE) 10 MG tablet Take 6 tabs by mouth daily for 2  days, then 5 tabs for 2 days, then 4 tabs for 2 days, then 3 tabs for 2 days, 2 tabs for 2 days, then 1 tab by mouth daily for 2 days 42 tablet 0  ?? risperiDONE (RISPERDAL) 3 MG tablet Take 1 tablet (3 mg total) by mouth 2 (two) times daily. 60 tablet 0  ?? sertraline (ZOLOFT) 100 MG tablet Take 1 tablet (100 mg total) by mouth daily. 30 tablet 3  ? ?No current facility-administered medications for this visit.  ? ? ? ?Musculoskeletal: ?Strength & Muscle Tone: n/a virtual visit ?Gait & Station: n/a ?Patient leans: n/a ? ?Psychiatric Specialty Exam: ?Review of Systems  ?Psychiatric/Behavioral:  Positive for dysphoric mood and sleep disturbance. Negative for hallucinations, self-injury and suicidal ideas. The patient is nervous/anxious.   ?All other systems reviewed and are negative.  ?There were no vitals taken for this visit.There is no height or  weight on file to calculate BMI.  ?General Appearance: n/a  ?Eye Contact:  n/a  ?Speech: Clear and coherent  ?Volume: Normal  ?Mood: Dysphoric  ?Affect: N/A  ?Thought Process: Goal directed  ?Orientation:  Full (Time, Place, and Person)  ?Thought Content: Logical   ?Suicidal Thoughts:  No  ?Homicidal Thoughts:  No  ?Memory: Good  ?Judgement:  good  ?Insight:  good  ?Psychomotor Activity:  good  ?Concentration: Good  ?Recall:  good  ?Fund of Knowledge: good  ?Language: Good  ?Akathisia:  NA  ?Handed:  Right  ?AIMS (if indicated): not done  ?Assets:  Communication Skills  ?ADL's:  Intact  ?Cognition: good  ?Sleep:  Fair  ? ?Screenings: ?AIMS   ? ?[facility]sheet [facility]  ?AIMS Total Score 0 0  ? ?  ? ?AUDIT   ? ?Flowsheet Row [facility]  ?Alcohol Use  Disorder Identification Test Final Score (AUDIT) 31 0  ? ?  ? ?GAD-7   ? ?Flowsheet Row [facility]

## 2021-08-08 ENCOUNTER — Encounter (HOSPITAL_COMMUNITY): Payer: Self-pay

## 2021-08-08 ENCOUNTER — Ambulatory Visit (HOSPITAL_COMMUNITY): Payer: No Payment, Other | Admitting: Licensed Clinical Social Worker

## 2021-08-08 ENCOUNTER — Other Ambulatory Visit: Payer: Self-pay

## 2021-08-08 NOTE — Progress Notes (Signed)
Cln informed by office staff that pt was going to be late and cln request that it be communicated that pt sign on by 4:12 pm. Cln sent text link inviting pt to session and remained online until 4:12 pm. Cln informed staff, who then informed cln that pt requires link to be emailed. Cln signed on again and sent email link. Pt signed on at 4:20 pm but cln could not hear him. The connection was then lost. Cln asked staff to reschedule.

## 2021-08-22 ENCOUNTER — Other Ambulatory Visit: Payer: Self-pay

## 2021-08-22 ENCOUNTER — Other Ambulatory Visit (HOSPITAL_COMMUNITY): Payer: Self-pay

## 2021-08-23 ENCOUNTER — Telehealth (HOSPITAL_COMMUNITY): Payer: Self-pay | Admitting: *Deleted

## 2021-08-23 NOTE — Telephone Encounter (Signed)
Call from patient, out of his trazodone and requesting new rx be sent to his pharmacy which is [facility]. Reviewed record, last seen in May, next appt is not until 8/23. He should be out and is interested in an increase in his trazodone, helps but not a lot. Will forward this concern to [facility] NP for tomorrow as its now 1600.

## 2021-08-24 ENCOUNTER — Other Ambulatory Visit (HOSPITAL_COMMUNITY): Payer: Self-pay | Admitting: Psychiatry

## 2021-08-24 ENCOUNTER — Other Ambulatory Visit: Payer: Self-pay

## 2021-08-24 DIAGNOSIS — F29 Unspecified psychosis not due to a substance or known physiological condition: Secondary | ICD-10-CM

## 2021-08-24 DIAGNOSIS — F411 Generalized anxiety disorder: Secondary | ICD-10-CM

## 2021-08-24 MED ORDER — GABAPENTIN 300 MG PO CAPS
300.0000 mg | ORAL_CAPSULE | Freq: Three times a day (TID) | ORAL | 3 refills | Status: DC
  Filled 2021-08-24 – 2021-09-22 (×2): qty 90, 30d supply, fill #0

## 2021-08-24 MED ORDER — RISPERIDONE 3 MG PO TABS
3.0000 mg | ORAL_TABLET | Freq: Two times a day (BID) | ORAL | 3 refills | Status: DC
  Filled 2021-08-24 – 2021-09-02 (×2): qty 60, 30d supply, fill #0
  Filled 2021-09-22 – 2021-10-10 (×2): qty 60, 30d supply, fill #1

## 2021-08-24 MED ORDER — SERTRALINE HCL 100 MG PO TABS
150.0000 mg | ORAL_TABLET | Freq: Every day | ORAL | 3 refills | Status: DC
  Filled 2021-08-24: qty 45, 30d supply, fill #0
  Filled 2021-10-07: qty 45, 30d supply, fill #1

## 2021-08-24 MED ORDER — TRAZODONE HCL 50 MG PO TABS
50.0000 mg | ORAL_TABLET | Freq: Every day | ORAL | 0 refills | Status: DC
  Filled 2021-08-24: qty 60, 30d supply, fill #0

## 2021-08-24 NOTE — Telephone Encounter (Signed)
Provider called patient and was notified by his wife that he was at work.  Provider informed patient wife that trazodone was increased to 50 to 100 mg nightly as needed.  She informed Clinical research associate that his other medications were coming up for renewal.  Provider refilled gabapentin, Risperdal, Zoloft, and trazodone.  No other concerns noted at this time.

## 2021-08-25 ENCOUNTER — Other Ambulatory Visit: Payer: Self-pay

## 2021-08-26 ENCOUNTER — Other Ambulatory Visit: Payer: Self-pay

## 2021-08-31 ENCOUNTER — Other Ambulatory Visit: Payer: Self-pay

## 2021-09-02 ENCOUNTER — Other Ambulatory Visit: Payer: Self-pay

## 2021-09-22 ENCOUNTER — Other Ambulatory Visit (HOSPITAL_COMMUNITY): Payer: Self-pay | Admitting: Psychiatry

## 2021-09-22 ENCOUNTER — Other Ambulatory Visit: Payer: Self-pay

## 2021-09-22 MED ORDER — TRAZODONE HCL 50 MG PO TABS
50.0000 mg | ORAL_TABLET | Freq: Every day | ORAL | 0 refills | Status: DC
  Filled 2021-09-22: qty 60, 30d supply, fill #0

## 2021-09-26 ENCOUNTER — Other Ambulatory Visit: Payer: Self-pay

## 2021-09-28 ENCOUNTER — Other Ambulatory Visit: Payer: Self-pay

## 2021-10-07 ENCOUNTER — Other Ambulatory Visit: Payer: Self-pay

## 2021-10-10 ENCOUNTER — Other Ambulatory Visit: Payer: Self-pay

## 2021-10-14 ENCOUNTER — Other Ambulatory Visit: Payer: Self-pay

## 2021-11-01 ENCOUNTER — Other Ambulatory Visit: Payer: Self-pay

## 2021-11-01 ENCOUNTER — Telehealth (INDEPENDENT_AMBULATORY_CARE_PROVIDER_SITE_OTHER): Payer: No Payment, Other | Admitting: Psychiatry

## 2021-11-01 ENCOUNTER — Encounter (HOSPITAL_COMMUNITY): Payer: Self-pay | Admitting: Psychiatry

## 2021-11-01 DIAGNOSIS — F411 Generalized anxiety disorder: Secondary | ICD-10-CM

## 2021-11-01 DIAGNOSIS — F29 Unspecified psychosis not due to a substance or known physiological condition: Secondary | ICD-10-CM | POA: Diagnosis not present

## 2021-11-01 MED ORDER — SERTRALINE HCL 100 MG PO TABS
150.0000 mg | ORAL_TABLET | Freq: Every day | ORAL | 3 refills | Status: DC
  Filled 2021-11-01: qty 45, 30d supply, fill #0
  Filled 2021-12-13: qty 45, 30d supply, fill #1
  Filled 2022-01-13: qty 45, 30d supply, fill #2

## 2021-11-01 MED ORDER — RISPERIDONE 4 MG PO TABS
4.0000 mg | ORAL_TABLET | Freq: Two times a day (BID) | ORAL | 3 refills | Status: DC
  Filled 2021-11-01: qty 60, 30d supply, fill #0
  Filled 2021-12-13: qty 60, 30d supply, fill #1
  Filled 2022-01-23 – 2022-01-24 (×2): qty 60, 30d supply, fill #2

## 2021-11-01 MED ORDER — TRAZODONE HCL 50 MG PO TABS
50.0000 mg | ORAL_TABLET | Freq: Every day | ORAL | 3 refills | Status: DC
  Filled 2021-11-01: qty 60, 30d supply, fill #0
  Filled 2021-12-13: qty 60, 30d supply, fill #1

## 2021-11-01 MED ORDER — GABAPENTIN 400 MG PO CAPS
400.0000 mg | ORAL_CAPSULE | Freq: Three times a day (TID) | ORAL | 3 refills | Status: DC
  Filled 2021-11-01: qty 90, 30d supply, fill #0
  Filled 2022-01-04: qty 90, 30d supply, fill #1

## 2021-11-01 NOTE — Progress Notes (Signed)
BH MD/PA/NP OP Progress Note Virtual Visit via Telephone Note  I connected with Jerry [name] on 11/01/21 at  4:00 PM EDT by telephone and verified that I am speaking with the correct person using two identifiers.  Location: Patient: home Provider: Clinic   I discussed the limitations, risks, security and privacy concerns of performing an evaluation and management service by telephone and the availability of in person appointments. I also discussed with the patient that there may be a patient responsible charge related to this service. The patient expressed understanding and agreed to proceed.   I provided 30 minutes of non-face-to-face time during this encounter.  11/01/2021 9:51 AM Jerry [name]  MRN:  [id number]  Chief Complaint: "I trazodone is not working for me" Per wife "He's so calm and so focused  HPI: 46 yo male seen today for follow up psychiatric evaluation.  He has a psychiatric history of depression, anxiety, schizophrenia spectrum disorder, substance induced mood disorder, polysubstance abuse (alcohol, tobacco, methamphetamine, marijuana, and cocaine in remission), PTSD, bipolar I, and malingering.  Currently, he is managed on Risperdal 3mg  daily, Risperdal 3 mg nightly, Zoloft 150 mg daily, and Trazodone 50-100 mg as needed.  He informed that his medications are somewhat effective in managing his psychiatric condition.  Today he was unable to login virtually so his assessment was done over the phone. During exam he was pleasant, cooperative, and engaged in conversation. He informed Clinical research associate that trazodone is not working for him.  He notes that he only gets 4 hours of sleep.  Patient reports that the lack of sleep is making him more anxious.  Provider conducted a GAD-7 and patient scored an 18.  He notes that he worries about his bills, his housing, and driving.  Provider also conducted PHQ-9 and patient scored a 14.  He endorses adequate appetite.  Patient informed Clinical research associate  that he constantly hears voices.  He notes that he hears everything is bad and something is coming for him. At times he reports that he fears getting sick. Today he denies SI/HI.    Patient was seen with his wife who notes that he is calm and focused.  She notes that he has drastically improved since being medicated however notes that sleep continues to be an issue.    Today patient is agreeable to increasing Risperdal 3 mg twice daily to 4 mg twice daily.  Help manage symptoms of psychosis and sleep.  He is also agreeable to increasing gabapentin 300 mg 3 times daily to 400 mg 3 times daily to help manage anxiety and sleep.  Provider recommended patient taking 2 gabapentin's at night to help with sleep if the increase in Risperdal does not work.  He endorsed understanding and agreed.  He will continue other medications as prescribed.  No other concerns at this time.     Visit Diagnosis:    ICD-10-CM   1. Schizophrenia spectrum disorder with psychotic disorder type not yet determined (HCC)  F29 risperiDONE (RISPERDAL) 4 MG tablet    2. Generalized anxiety disorder  F41.1 gabapentin (NEURONTIN) 400 MG capsule    sertraline (ZOLOFT) 100 MG tablet    traZODone (DESYREL) 50 MG tablet      Past Psychiatric History: Bipolar disorder, PTSD, depression, anxiety, schizophrenia spectrum disorder, substance-induced mood disorder (marijuana, cocaine, tobacco, methamphetamines,) and malingering  Past Medical History:  Past Medical History:  Diagnosis Date   Anxiety    Bipolar 1 disorder (HCC)    Depression  Hallucination    Paranoid schizophrenia ([place])    since pt's 20's   PTSD (post-traumatic stress disorder)     Past Surgical History:  Procedure Laterality Date   KIDNEY DONATION Right 2003    Family Psychiatric History: Mother substance use, son substance use,two sons cutting behaviors  Family History:  Family History  Problem Relation Age of Onset   Heart disease Mother    Cancer  Mother    Heart disease Father    Diabetes Father    Diabetes Maternal Grandmother    Stroke Paternal Grandfather     Social History:  Social History   Socioeconomic History   Marital status: Married    Spouse name: Not on file   Number of children: Not on file   Years of education: Not on file   Highest education level: Not on file  Occupational History   Not on file  Tobacco Use   Smoking status: Every Day    Packs/day: 2.00    Types: Cigarettes   Smokeless tobacco: Never  Vaping Use   Vaping Use: Never used  Substance and Sexual Activity   Alcohol use: Yes    Alcohol/week: 32.0 standard drinks of alcohol    Types: 12 Cans of beer, 20 Shots of liquor per week    Comment: last use 10/28/20- drinks 1/2 gallon vodka daily   Drug use: Not Currently    Types: Marijuana, Cocaine, Heroin, Amphetamines, Methamphetamines    Comment: history of IVDA; last use 10/28/20   Sexual activity: Yes  Other Topics Concern   Not on file  Social History Narrative   Pt has been homeless for the past year and living outdoors for the past week; previously he has been living in hotels. Separated from wife. No family support.   Social Determinants of Health   Financial Resource Strain: Not on file  Food Insecurity: Not on file  Transportation Needs: Not on file  Physical Activity: Not on file  Stress: Not on file  Social Connections: Not on file    Allergies:  Allergies  Allergen Reactions   Penicillins Other (See Comments)    Reaction occurred in childhood    Metabolic Disorder Labs: Lab Results  Component Value Date   HGBA1C 5.5 10/31/2020   MPG 111.15 10/31/2020   MPG 105.41 07/17/2020   No results found for: "PROLACTIN" Lab Results  Component Value Date   CHOL 152 10/31/2020   TRIG 109 10/31/2020   HDL 48 10/31/2020   CHOLHDL 3.2 10/31/2020   VLDL 22 10/31/2020   LDLCALC 82 10/31/2020   LDLCALC 100 (H) 07/17/2020   Lab Results  Component Value Date   TSH 0.751  07/17/2020   TSH 1.503 06/25/2014    Therapeutic Level Labs: No results found for: "LITHIUM" No results found for: "VALPROATE" No results found for: "CBMZ"  Current Medications: Current Outpatient Medications  Medication Sig Dispense Refill   gabapentin (NEURONTIN) 400 MG capsule Take 1 capsule (400 mg total) by mouth 3 (three) times daily. 90 capsule 3   predniSONE (DELTASONE) 10 MG tablet Take 6 tabs by mouth daily for 2 days, then 5 tabs for 2 days, then 4 tabs for 2 days, then 3 tabs for 2 days, 2 tabs for 2 days, then 1 tab by mouth daily for 2 days 42 tablet 0   risperiDONE (RISPERDAL) 4 MG tablet Take 1 tablet (4 mg total) by mouth 2 (two) times daily. 60 tablet 3   sertraline (ZOLOFT) 100 MG tablet  Take 1.5 tablets (150 mg total) by mouth daily. 45 tablet 3   traZODone (DESYREL) 50 MG tablet Take 1-2 tablets (50-100 mg total) by mouth at bedtime. 60 tablet 3   No current facility-administered medications for this visit.     Musculoskeletal: Strength & Muscle Tone:  Unable to assess due to telephone visit Gait & Station:  Unable to assess due to telephone visit Patient leans: N/A  Psychiatric Specialty Exam: Review of Systems  There were no vitals taken for this visit.There is no height or weight on file to calculate BMI.  General Appearance:  Unable to assess due to telephone visit  Eye Contact:   Unable to assess due to telephone visit  Speech:  Clear and Coherent and Normal Rate  Volume:  Normal  Mood:  Anxious and Depressed  Affect:  Appropriate and Congruent  Thought Process:  Coherent, Goal Directed, and Linear  Orientation:  Full (Time, Place, and Person)  Thought Content: Logical and Hallucinations: Auditory   Suicidal Thoughts:  No  Homicidal Thoughts:  No  Memory:  Immediate;   Good Recent;   Good Remote;   Good  Judgement:  Good  Insight:  Good  Psychomotor Activity:   Unable to assess due to telephone visit  Concentration:  Concentration: Good and  Attention Span: Good  Recall:  Good  Fund of Knowledge: Good  Language: Good  Akathisia:   Unable to assess due to telephone visit  Handed:  Right  AIMS (if indicated): Unable to assess due to telephone visit  Assets:  Communication Skills Desire for Improvement Financial Resources/Insurance Housing Physical Health Social Support  ADL's:  Intact  Cognition: WNL  Sleep:  Fair   Screenings: AIMS    Flowsheet Row Admission (Discharged) from 10/30/2020 in [facility] Admission (Discharged) from 07/16/2020 in [facility]  AIMS Total Score 0 0      AUDIT    Flowsheet Row Admission (Discharged) from 10/30/2020 in [place] Admission (Discharged) from 07/16/2020 in [facility]  Alcohol Use Disorder Identification Test Final Score (AUDIT) 31 0      GAD-7    Flowsheet Row Video Visit from 11/01/2021 in [facility] Video Visit from 03/22/2021 in [facility] Office Visit from 12/21/2020 [facility]  Total GAD-7 Score 18 20 19       PHQ2-9    Flowsheet Row Video Visit from 11/01/2021 in [facility] Video Visit from 03/22/2021 in [facility] Counselor from 02/24/2021 in [facility] Office Visit from 12/21/2020 in [facility]  PHQ-2 Total Score 2 5 5 5   PHQ-9 Total Score 14 17 16 19       Flowsheet Row ED from 07/16/2021 in [place] DEPT ED from 06/09/2021 in [place] [facility]PT ED from 05/18/2021 in [place] DEPT  C-SSRS RISK CATEGORY No Risk No Risk No Risk        Assessment and Plan: Patient endorses symptoms of anxiety, depression, psychosis, and insomnia. Today  patient is agreeable to increasing Risperdal 3 mg twice daily to 4 mg twice daily.  Help manage symptoms of psychosis and sleep.  He is also agreeable to increasing gabapentin 300 mg 3 times daily to 400 mg 3 times daily to help manage anxiety and sleep.  Provider  recommended patient taking 2 gabapentin's at night to help with sleep if the increase in Risperdal does not work.  He endorsed understanding and agreed.  He will continue other medications as prescribed.   1. Schizophrenia spectrum disorder with psychotic disorder type not yet determined (HCC)  Increased- risperidone (RISPERDAL) 4 MG tablet; Take 1 tablet (4 mg total) by mouth 2 (two) times daily.  Dispense: 60 tablet; Refill: 3  2. Generalized anxiety disorder  Increased- gabapentin (NEURONTIN) 400 MG capsule; Take 1 capsule (400 mg total) by mouth 3 (three) times daily.  Dispense: 90 capsule; Refill: 3 Continue- sertraline (ZOLOFT) 100 MG tablet; Take 1.5 tablets (150 mg total) by mouth daily.  Dispense: 45 tablet; Refill: 3 Continue- traZODone (DESYREL) 50 MG tablet; Take 1-2 tablets (50-100 mg total) by mouth at bedtime.  Dispense: 60 tablet; Refill: 3   Follow-up in 3 months Shanna Cisco, NP 11/01/2021, 9:51 AM

## 2021-11-07 ENCOUNTER — Other Ambulatory Visit: Payer: Self-pay

## 2021-11-24 ENCOUNTER — Other Ambulatory Visit: Payer: Self-pay

## 2021-11-24 ENCOUNTER — Encounter (HOSPITAL_COMMUNITY): Payer: Self-pay | Admitting: Emergency Medicine

## 2021-11-24 ENCOUNTER — Emergency Department (HOSPITAL_COMMUNITY)
Admission: EM | Admit: 2021-11-24 | Discharge: 2021-11-24 | Disposition: A | Discharge status: Home / Self Care | Payer: Self-pay | Attending: Emergency Medicine | Admitting: Emergency Medicine

## 2021-11-24 DIAGNOSIS — M5441 Lumbago with sciatica, right side: Secondary | ICD-10-CM | POA: Insufficient documentation

## 2021-11-24 MED ORDER — DEXAMETHASONE SODIUM PHOSPHATE 10 MG/ML IJ SOLN
10.0000 mg | Freq: Once | INTRAMUSCULAR | Status: AC
  Administered 2021-11-24: 10 mg via INTRAMUSCULAR
  Filled 2021-11-24: qty 1

## 2021-11-24 MED ORDER — MELOXICAM 15 MG PO TABS
15.0000 mg | ORAL_TABLET | Freq: Every day | ORAL | 0 refills | Status: AC
  Filled 2021-11-24: qty 30, 30d supply, fill #0

## 2021-11-24 MED ORDER — PREDNISONE 10 MG PO TABS
ORAL_TABLET | Freq: Every day | ORAL | 0 refills | Status: DC
  Filled 2021-11-24: qty 42, 12d supply, fill #0

## 2021-11-24 NOTE — ED Triage Notes (Signed)
Patient c/o lower back pain since yesterday. States history of herniated disc. Reports flare in back pain every few months. States he lifted pallets yesterday and pain persistent since that time. Ambulatory. Denies incontinence.

## 2021-11-24 NOTE — ED Notes (Signed)
An After Visit Summary was printed and given to the patient. Discharge instructions given and no further questions at this time.  

## 2021-11-24 NOTE — ED Provider Notes (Signed)
[facility]-EMERGENCY DEPT Provider Note   CSN: [id number] Arrival date & time: 11/24/21  1112     History  Chief Complaint  Patient presents with   Back Pain    Jerry [name] is a 46 y.o. male.  Patient presents to the hospital complaining of low back pain with right-sided leg pain.  Patient states he has a history of a herniated disc and feels that he flared it up at work a couple of days ago.  He denies seeing neurosurgery in the past but states he knows he needs to see them at some point.  Patient denies any new injury or trauma.  Patient denies urinary incontinence, retention, saddle anesthesia.  Patient with past medical history significant for hallucination, paranoid schizophrenia, bipolar 1 disorder, PTSD, depression, anxiety current smoker  HPI     Home Medications Prior to Admission medications   Medication Sig Start Date End Date Taking? Authorizing Provider  meloxicam (MOBIC) 15 MG tablet Take 1 tablet (15 mg total) by mouth daily. 11/24/21 12/24/21 Yes Peja Allender, Dorisann Frames, PA-C  predniSONE (STERAPRED UNI-PAK 21 TAB) 10 MG (21) TBPK tablet Take by mouth daily. Take 6 tabs by mouth daily  for 2 days, then 5 tabs for 2 days, then 4 tabs for 2 days, then 3 tabs for 2 days, 2 tabs for 2 days, then 1 tab by mouth daily for 2 days 11/24/21  Yes Barrie Dunker B, PA-C  gabapentin (NEURONTIN) 400 MG capsule Take 1 capsule (400 mg total) by mouth 3 (three) times daily. 11/01/21   Shanna Cisco, NP  predniSONE (DELTASONE) 10 MG tablet Take 6 tabs by mouth daily for 2 days, then 5 tabs for 2 days, then 4 tabs for 2 days, then 3 tabs for 2 days, 2 tabs for 2 days, then 1 tab by mouth daily for 2 days 07/16/21   Gloris Manchester, MD  risperidone (RISPERDAL) 4 MG tablet Take 1 tablet (4 mg total) by mouth 2 (two) times daily. 11/01/21   Shanna Cisco, NP  sertraline (ZOLOFT) 100 MG tablet Take 1.5 tablets (150 mg total) by mouth daily. 11/01/21   Shanna Cisco, NP   traZODone (DESYREL) 50 MG tablet Take 1-2 tablets (50-100 mg total) by mouth at bedtime. 11/01/21   Shanna Cisco, NP      Allergies    Penicillins    Review of Systems   Review of Systems  Genitourinary:  Negative for difficulty urinating.  Musculoskeletal:  Positive for back pain.    Physical Exam Updated Vital Signs BP 110/67   Pulse (!) 53   Temp 97.8 F (36.6 C) (Oral)   Resp 16   SpO2 97%  Physical Exam Vitals and nursing note reviewed.  Constitutional:      General: He is not in acute distress.    Appearance: He is well-developed.  HENT:     Head: Normocephalic and atraumatic.  Eyes:     Conjunctiva/sclera: Conjunctivae normal.  Cardiovascular:     Rate and Rhythm: Normal rate and regular rhythm.     Heart sounds: No murmur heard. Pulmonary:     Effort: Pulmonary effort is normal. No respiratory distress.     Breath sounds: Normal breath sounds.  Abdominal:     Palpations: Abdomen is soft.     Tenderness: There is no abdominal tenderness.  Musculoskeletal:        General: No swelling.     Cervical back: Neck supple.  Comments: No midline spinal tenderness.  Lumbar tenderness to palpation on the right side.  Skin:    General: Skin is warm and dry.     Capillary Refill: Capillary refill takes less than 2 seconds.  Neurological:     General: No focal deficit present.     Mental Status: He is alert and oriented to person, place, and time.     Comments: Cranial nerves II through VII, XI, XII intact.  No focal deficits noted.  Normal gait  Psychiatric:        Mood and Affect: Mood normal.     ED Results / Procedures / Treatments   Labs (all labs ordered are listed, but only abnormal results are displayed) Labs Reviewed - No data to display  EKG None  Radiology No results found.  Procedures Procedures    Medications Ordered in ED Medications  dexamethasone (DECADRON) injection 10 mg (has no administration in time range)    ED Course/  Medical Decision Making/ A&P                           Medical Decision Making  Patient presents to the hospital complaining of low back pain with right-sided leg symptoms.  Patient has known herniated disc.  Differential includes was not limited to sciatica due to herniated disc, cauda equina, fracture, dislocation, and others  Patient denies any new injury or trauma.  Very low clinical suspicion of a fracture or dislocation.  Patient has no red flag symptoms such as saddle anesthesia, urinary incontinence, urinary retention.  I see no indication for imaging at this time  I reviewed the patient's past medical history including emergency department visits in April of this year.  Also reviewed MRI results from April 29 showing lumbar spondylosis at L5-S1 with impingement of the right S1 nerve root  Patient has no neurologic deficits.  Patient complains of continuation of his lumbar pain with right-sided leg pain.  This seems to be a flare of his sciatica due to his herniated disc.  Patient aware that he needs to follow-up with neurosurgery but states he is unable to do so at this time.  I ordered the patient Decadron for inflammation.  Upon reassessment the patient has stayed the same.  I feel that the patient is stable for discharge.  There is no emergent condition at this time.  Patient needs follow-up with neurosurgery.  Patient prescribed a steroid Dosepak and meloxicam.  Patient given return precautions.  Discharged home       Final Clinical Impression(s) / ED Diagnoses Final diagnoses:  Right-sided low back pain with right-sided sciatica, unspecified chronicity    Rx / DC Orders ED Discharge Orders          Ordered    meloxicam (MOBIC) 15 MG tablet  Daily        11/24/21 1332    predniSONE (STERAPRED UNI-PAK 21 TAB) 10 MG (21) TBPK tablet  Daily        11/24/21 1332              Pamala Duffel 11/24/21 1336    Loetta Rough, MD 11/24/21 [phone]

## 2021-11-24 NOTE — Discharge Instructions (Addendum)
You were seen today for right-sided back pain and right-sided symptoms going down to your right leg.  These are likely from your previously noted herniated disc.  Please follow-up with neurosurgery.  I have prescribed a steroid Dosepak and meloxicam to be used as prescribed.  Return to the hospital if you develop any life-threatening conditions

## 2021-11-25 ENCOUNTER — Other Ambulatory Visit: Payer: Self-pay

## 2021-12-13 ENCOUNTER — Other Ambulatory Visit: Payer: Self-pay

## 2021-12-14 ENCOUNTER — Other Ambulatory Visit: Payer: Self-pay

## 2022-01-04 ENCOUNTER — Other Ambulatory Visit: Payer: Self-pay

## 2022-01-11 ENCOUNTER — Encounter (HOSPITAL_COMMUNITY): Payer: Self-pay

## 2022-01-11 ENCOUNTER — Other Ambulatory Visit: Payer: Self-pay

## 2022-01-11 ENCOUNTER — Ambulatory Visit (HOSPITAL_COMMUNITY)
Admission: EM | Admit: 2022-01-11 | Discharge: 2022-01-11 | Disposition: A | Discharge status: Home / Self Care | Payer: Self-pay | Attending: Emergency Medicine | Admitting: Emergency Medicine

## 2022-01-11 DIAGNOSIS — S39012A Strain of muscle, fascia and tendon of lower back, initial encounter: Secondary | ICD-10-CM

## 2022-01-11 DIAGNOSIS — M545 Low back pain, unspecified: Secondary | ICD-10-CM

## 2022-01-11 MED ORDER — CYCLOBENZAPRINE HCL 10 MG PO TABS
10.0000 mg | ORAL_TABLET | Freq: Two times a day (BID) | ORAL | 0 refills | Status: AC | PRN
  Filled 2022-01-11: qty 6, 3d supply, fill #0

## 2022-01-11 MED ORDER — KETOROLAC TROMETHAMINE 30 MG/ML IJ SOLN
INTRAMUSCULAR | Status: AC
  Filled 2022-01-11: qty 1

## 2022-01-11 MED ORDER — KETOROLAC TROMETHAMINE 30 MG/ML IJ SOLN
30.0000 mg | Freq: Once | INTRAMUSCULAR | Status: AC
Start: 2022-01-11 — End: 2022-01-11
  Administered 2022-01-11: 30 mg via INTRAMUSCULAR

## 2022-01-11 NOTE — ED Triage Notes (Signed)
Patient with c/o lower back pain, mainly on the right side. States he has hx of herniated disc and sciatica.

## 2022-01-11 NOTE — ED Provider Notes (Signed)
[facility]    CSN: [id number] Arrival date & time: 01/11/22  1859     History   Chief Complaint Chief Complaint  Patient presents with   Back Pain    HPI Jerry [name] is a 46 y.o. male.  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 No saddle anesthesia Denies numbness or tingling in the extremities  No medications PTA  History of right LBP with sciatica MRI done in April showed spondylosis L5-S1, right S1 nerve root impingement  He does not have a PCP and has not followed up with ortho regarding his MRI, due to working two jobs  Past Medical History:  Diagnosis Date   Anxiety    Bipolar 1 disorder ([place])    Depression    Hallucination    Paranoid schizophrenia ([place])    since pt's 20's   PTSD (post-traumatic stress disorder)     Patient Active Problem List   Diagnosis Date Noted   Auditory hallucination    Malingering 09/07/2020   Homelessness 09/07/2020   Substance induced mood disorder ([place]) 09/07/2020   Major depressive disorder, recurrent severe without psychotic features ([place]) 07/17/2020   Schizophrenia spectrum disorder with psychotic disorder type not yet determined ([place]) 07/17/2020   Methamphetamine abuse ([place]) 07/17/2020   Marijuana abuse 07/17/2020   Cocaine abuse ([place]) 07/17/2020   Anxiety and depression 06/25/2014   Family history of diabetes mellitus (DM) 06/25/2014   Family history of thyroid disease 06/25/2014   Tobacco abuse 06/25/2014   Alcohol abuse 05/18/2013   Polysubstance abuse ([place]) 05/18/2013   Depression, unspecified 05/18/2013    Past Surgical History:  Procedure Laterality Date   KIDNEY DONATION Right 2003       Home Medications    Prior to Admission medications   Medication Sig Start Date End Date Taking? Authorizing Provider  cyclobenzaprine (FLEXERIL) 10 MG tablet Take 1 tablet (10 mg total) by mouth 2 (two)  times daily as needed for up to 3 days for muscle spasms. 01/11/22 01/14/22 Yes Mikelle Myrick, Wells Guiles, PA-C  gabapentin (NEURONTIN) 400 MG capsule Take 1 capsule (400 mg total) by mouth 3 (three) times daily. 11/01/21   Salley Slaughter, NP  risperidone (RISPERDAL) 4 MG tablet Take 1 tablet (4 mg total) by mouth 2 (two) times daily. 11/01/21   Salley Slaughter, NP  sertraline (ZOLOFT) 100 MG tablet Take 1.5 tablets (150 mg total) by mouth daily. 11/01/21   Salley Slaughter, NP  traZODone (DESYREL) 50 MG tablet Take 1-2 tablets (50-100 mg total) by mouth at bedtime. 11/01/21   Salley Slaughter, NP    Family History Family History  Problem Relation Age of Onset   Heart disease Mother    Cancer Mother    Heart disease Father    Diabetes Father    Diabetes Maternal Grandmother    Stroke Paternal Grandfather     Social History Social History   Tobacco Use   Smoking status: Every Day    Packs/day: 2.00    Types: Cigarettes   Smokeless tobacco: Never  Vaping Use   Vaping Use: Never used  Substance Use Topics   Alcohol use: Yes    Alcohol/week: 32.0 standard drinks of alcohol    Types: 12 Cans of beer, 20 Shots of liquor per week    Comment: last use 10/28/20- drinks 1/2 gallon vodka daily   Drug use: Not Currently  Types: Marijuana, Cocaine, Heroin, Amphetamines, Methamphetamines    Comment: history of IVDA; last use 10/28/20     Allergies   Penicillins   Review of Systems Review of Systems  Musculoskeletal:  Positive for back pain.   Per HPI  Physical Exam Triage Vital Signs ED Triage Vitals  Enc Vitals Group     BP 01/11/22 1927 118/69     Pulse Rate 01/11/22 1927 (!) 59     Resp 01/11/22 1927 16     Temp 01/11/22 1927 97.7 F (36.5 C)     Temp Source 01/11/22 1927 Oral     SpO2 01/11/22 1928 97 %     Weight --      Height --      Head Circumference --      Peak Flow --      Pain Score 01/11/22 1928 9     Pain Loc --      Pain Edu? --      Excl. in GC?  --    No data found.  Updated Vital Signs BP 118/69 (BP Location: Right Arm)   Pulse (!) 59   Temp 97.7 F (36.5 C) (Oral)   Resp 16   SpO2 97%    Physical Exam Vitals and nursing note reviewed.  Constitutional:      General: He is not in acute distress.    Appearance: He is not ill-appearing.  HENT:     Mouth/Throat:     Mouth: Mucous membranes are moist.     Pharynx: Oropharynx is clear.  Eyes:     Extraocular Movements: Extraocular movements intact.     Conjunctiva/sclera: Conjunctivae normal.     Pupils: Pupils are equal, round, and reactive to light.  Cardiovascular:     Rate and Rhythm: Normal rate and regular rhythm.     Pulses: Normal pulses.     Heart sounds: Normal heart sounds.  Pulmonary:     Effort: Pulmonary effort is normal.     Breath sounds: Normal breath sounds.  Musculoskeletal:        General: Tenderness present.     Cervical back: No rigidity or tenderness.     Comments: No spinal tenderness. Some right paraspinal tightness and tenderness.   Skin:    General: Skin is warm and dry.  Neurological:     General: No focal deficit present.     Mental Status: He is alert and oriented to person, place, and time.     Cranial Nerves: Cranial nerves 2-12 are intact.     Sensory: Sensation is intact.     Motor: Motor function is intact. No weakness.     Coordination: Coordination is intact.     Gait: Gait is intact.     Comments: 5/5 strength all extremities. Sensation intact     UC Treatments / Results  Labs (all labs ordered are listed, but only abnormal results are displayed) Labs Reviewed - No data to display  EKG  Radiology No results found.  Procedures Procedures   Medications Ordered in UC Medications  ketorolac (TORADOL) 30 MG/ML injection 30 mg (30 mg Intramuscular Given 01/11/22 1957)    Initial Impression / Assessment and Plan / UC Course  I have reviewed the triage vital signs and the nursing notes.  Pertinent labs & imaging  results that were available during my care of the patient were reviewed by me and considered in my medical decision making (see chart for details).  Reviewed previous lab work,  5 months ago kidney function was normal. Toradol injection given with improvement in pain. Defer imaging today given no spinal tenderness.  Discussed use of tylenol or ibuprofen for pain and inflammation control. Recommend lidocaine patch on the area of pain Muscle relaxer flexeril 10 mg twice daily prn Recommend close follow up with orthopedics. Provided [facility] information. Added to PCP assistance list. Return precautions discussed. Patient agrees to plan  Final Clinical Impressions(s) / UC Diagnoses   Final diagnoses:  Strain of lumbar region, initial encounter  Acute right-sided low back pain without sciatica     Discharge Instructions      I recommend using 650 mg tylenol every 4-6 hours for pain. You can apply a lidocaine patch to the back and leave on for 12 hours at at time.  The muscle relaxer can be taken twice daily. Take only at bedtime if the medicine makes you drowsy.  Follow up with the orthopedic specialists.     ED Prescriptions     Medication Sig Dispense Auth. Provider   cyclobenzaprine (FLEXERIL) 10 MG tablet Take 1 tablet (10 mg total) by mouth 2 (two) times daily as needed for up to 3 days for muscle spasms. 6 tablet Kwynn Schlotter, Lurena Joiner, PA-C      PDMP not reviewed this encounter.   Allyssia Skluzacek, Ray Church 01/11/22 2029

## 2022-01-11 NOTE — Discharge Instructions (Addendum)
I recommend using 650 mg tylenol every 4-6 hours for pain. You can apply a lidocaine patch to the back and leave on for 12 hours at at time.  The muscle relaxer can be taken twice daily. Take only at bedtime if the medicine makes you drowsy.  Follow up with the orthopedic specialists.

## 2022-01-12 ENCOUNTER — Other Ambulatory Visit: Payer: Self-pay

## 2022-01-13 ENCOUNTER — Other Ambulatory Visit: Payer: Self-pay

## 2022-01-19 ENCOUNTER — Other Ambulatory Visit: Payer: Self-pay

## 2022-01-20 ENCOUNTER — Other Ambulatory Visit: Payer: Self-pay

## 2022-01-23 ENCOUNTER — Other Ambulatory Visit: Payer: Self-pay

## 2022-01-24 ENCOUNTER — Other Ambulatory Visit: Payer: Self-pay

## 2022-01-26 ENCOUNTER — Encounter (HOSPITAL_COMMUNITY): Payer: Self-pay | Admitting: Psychiatry

## 2022-01-26 ENCOUNTER — Telehealth (INDEPENDENT_AMBULATORY_CARE_PROVIDER_SITE_OTHER): Payer: No Payment, Other | Admitting: Psychiatry

## 2022-01-26 ENCOUNTER — Other Ambulatory Visit: Payer: Self-pay

## 2022-01-26 DIAGNOSIS — F411 Generalized anxiety disorder: Secondary | ICD-10-CM | POA: Diagnosis not present

## 2022-01-26 DIAGNOSIS — F29 Unspecified psychosis not due to a substance or known physiological condition: Secondary | ICD-10-CM

## 2022-01-26 MED ORDER — RISPERIDONE 4 MG PO TABS
4.0000 mg | ORAL_TABLET | Freq: Two times a day (BID) | ORAL | 3 refills | Status: DC
  Filled 2022-03-03: qty 60, 30d supply, fill #0
  Filled 2022-04-14: qty 60, 30d supply, fill #1

## 2022-01-26 MED ORDER — SERTRALINE HCL 100 MG PO TABS
200.0000 mg | ORAL_TABLET | Freq: Every day | ORAL | 3 refills | Status: DC
  Filled 2022-01-26: qty 60, 30d supply, fill #0
  Filled 2022-02-28: qty 60, 30d supply, fill #1
  Filled 2022-03-29 (×2): qty 60, 30d supply, fill #2
  Filled 2022-05-15: qty 60, 30d supply, fill #3

## 2022-01-26 MED ORDER — TRAZODONE HCL 50 MG PO TABS
50.0000 mg | ORAL_TABLET | Freq: Every day | ORAL | 3 refills | Status: DC
  Filled 2022-01-26: qty 60, 30d supply, fill #0
  Filled 2022-03-10: qty 60, 30d supply, fill #1
  Filled 2022-05-15: qty 60, 30d supply, fill #2

## 2022-01-26 MED ORDER — GABAPENTIN 400 MG PO CAPS
400.0000 mg | ORAL_CAPSULE | Freq: Three times a day (TID) | ORAL | 3 refills | Status: DC
  Filled 2022-01-26: qty 90, 30d supply, fill #0
  Filled 2022-02-28: qty 90, 30d supply, fill #1
  Filled 2022-03-29 (×2): qty 90, 30d supply, fill #2
  Filled 2022-04-26: qty 90, 30d supply, fill #3

## 2022-01-26 NOTE — Progress Notes (Signed)
BH MD/PA/NP OP Progress Note Virtual Visit via Telephone Note  I connected with Jerry [name] on 01/26/22 at 11:00 AM EST by telephone and verified that I am speaking with the correct person using two identifiers.  Location: Patient: home Provider: Clinic   I discussed the limitations, risks, security and privacy concerns of performing an evaluation and management service by telephone and the availability of in person appointments. I also discussed with the patient that there may be a patient responsible charge related to this service. The patient expressed understanding and agreed to proceed.   I provided 30 minutes of non-face-to-face time during this encounter.  01/26/2022 11:22 AM Jerry [name]  MRN:  [id number]  Chief Complaint: "I have been paranoid and anxious"   HPI: 46 yo male seen today for follow up psychiatric evaluation.  He has a psychiatric history of depression, anxiety, schizophrenia spectrum disorder, substance induced mood disorder, polysubstance abuse (alcohol, tobacco, methamphetamine, marijuana, and cocaine in remission), PTSD, bipolar I, and malingering.  Currently, he is managed on Risperdal 4 mg twice daily, Zoloft 150 mg daily, gabapentin 400 mg three times daily, and Trazodone 50-100 mg as needed.  He informed Clinical research associate that his medications are somewhat effective in managing his psychiatric condition.  Today he was unable to login virtually so his assessment was done over the phone. During exam he was pleasant, cooperative, and engaged in conversation. He informed Clinical research associate that he has been paranoid and anxious.  At times he notes that he feels that people are following him.  He notes that this paranoia only started a few weeks ago as his stress exacerbated.  He notes that he is worried about the holidays, his family, his finances.  Provider conducted a GAD-7 and patient scored a 17, at his last visit he scored 18.  Provider also conducted PHQ-9 and patient scored a 15, at  his last visit he scored a 14.  Patient notes that his hallucinations has improved since increasing Risperdal.  He notes that he hears them periodically but reports that he is able to cope with this.  He also informed writer that his sleep has improved and he now sleeps 6 to 8 hours nightly.  Today he denies SI/HI/VH or mania.    Today Zoloft 150 mg decreased to 200 mg to help manage symptoms of anxiety and depression.  He will continue all other medications as prescribed.  No other concerns at this time   Visit Diagnosis:    ICD-10-CM   1. Generalized anxiety disorder  F41.1 gabapentin (NEURONTIN) 400 MG capsule    sertraline (ZOLOFT) 100 MG tablet    traZODone (DESYREL) 50 MG tablet    2. Schizophrenia spectrum disorder with psychotic disorder type not yet determined ([facility])  F29 risperidone (RISPERDAL) 4 MG tablet      Past Psychiatric History: Bipolar disorder, PTSD, depression, anxiety, schizophrenia spectrum disorder, substance-induced mood disorder (marijuana, cocaine, tobacco, methamphetamines,) and malingering  Past Medical History:  Past Medical History:  Diagnosis Date   Anxiety    Bipolar 1 disorder ([facility])    Depression    Hallucination    Paranoid schizophrenia ([facility])    since pt's 20's   PTSD (post-traumatic stress disorder)     Past Surgical History:  Procedure Laterality Date   KIDNEY DONATION Right 2003    Family Psychiatric History: Mother substance use, son substance use,two sons cutting behaviors  Family History:  Family History  Problem Relation Age of Onset   Heart disease Mother  Cancer Mother    Heart disease Father    Diabetes Father    Diabetes Maternal Grandmother    Stroke Paternal Grandfather     Social History:  Social History   Socioeconomic History   Marital status: Married    Spouse name: Not on file   Number of children: Not on file   Years of education: Not on file   Highest education level: Not on file  Occupational History    Not on file  Tobacco Use   Smoking status: Every Day    Packs/day: 2.00    Types: Cigarettes   Smokeless tobacco: Never  Vaping Use   Vaping Use: Never used  Substance and Sexual Activity   Alcohol use: Yes    Alcohol/week: 32.0 standard drinks of alcohol    Types: 12 Cans of beer, 20 Shots of liquor per week    Comment: last use 10/28/20- drinks 1/2 gallon vodka daily   Drug use: Not Currently    Types: Marijuana, Cocaine, Heroin, Amphetamines, Methamphetamines    Comment: history of IVDA; last use 10/28/20   Sexual activity: Yes  Other Topics Concern   Not on file  Social History Narrative   Pt has been homeless for the past year and living outdoors for the past week; previously he has been living in hotels. Separated from wife. No family support.   Social Determinants of Health   Financial Resource Strain: Not on file  Food Insecurity: Not on file  Transportation Needs: Not on file  Physical Activity: Not on file  Stress: Not on file  Social Connections: Not on file    Allergies:  Allergies  Allergen Reactions   Penicillins Other (See Comments)    Reaction occurred in childhood    Metabolic Disorder Labs: Lab Results  Component Value Date   HGBA1C 5.5 10/31/2020   MPG 111.15 10/31/2020   MPG 105.41 07/17/2020   No results found for: "PROLACTIN" Lab Results  Component Value Date   CHOL 152 10/31/2020   TRIG 109 10/31/2020   HDL 48 10/31/2020   CHOLHDL 3.2 10/31/2020   VLDL 22 10/31/2020   LDLCALC 82 10/31/2020   LDLCALC 100 (H) 07/17/2020   Lab Results  Component Value Date   TSH 0.751 07/17/2020   TSH 1.503 06/25/2014    Therapeutic Level Labs: No results found for: "LITHIUM" No results found for: "VALPROATE" No results found for: "CBMZ"  Current Medications: Current Outpatient Medications  Medication Sig Dispense Refill   gabapentin (NEURONTIN) 400 MG capsule Take 1 capsule (400 mg total) by mouth 3 (three) times daily. 90 capsule 3    risperidone (RISPERDAL) 4 MG tablet Take 1 tablet (4 mg total) by mouth 2 (two) times daily. 60 tablet 3   sertraline (ZOLOFT) 100 MG tablet Take 2 tablets (200 mg total) by mouth daily. 60 tablet 3   traZODone (DESYREL) 50 MG tablet Take 1-2 tablets (50-100 mg total) by mouth at bedtime. 60 tablet 3   No current facility-administered medications for this visit.     Musculoskeletal: Strength & Muscle Tone:  Unable to assess due to telephone visit Gait & Station:  Unable to assess due to telephone visit Patient leans: N/A  Psychiatric Specialty Exam: Review of Systems  There were no vitals taken for this visit.There is no height or weight on file to calculate BMI.  General Appearance:  Unable to assess due to telephone visit  Eye Contact:   Unable to assess due to telephone visit  Speech:  Clear and Coherent and Normal Rate  Volume:  Normal  Mood:  Anxious  Affect:  Appropriate and Congruent  Thought Process:  Coherent, Goal Directed, and Linear  Orientation:  Full (Time, Place, and Person)  Thought Content: Logical and Hallucinations: Auditory   Suicidal Thoughts:  No  Homicidal Thoughts:  No  Memory:  Immediate;   Good Recent;   Good Remote;   Good  Judgement:  Good  Insight:  Good  Psychomotor Activity:   Unable to assess due to telephone visit  Concentration:  Concentration: Good and Attention Span: Good  Recall:  Good  Fund of Knowledge: Good  Language: Good  Akathisia:   Unable to assess due to telephone visit  Handed:  Right  AIMS (if indicated): Unable to assess due to telephone visit  Assets:  Communication Skills Desire for Improvement Financial Resources/Insurance Housing Physical Health Social Support  ADL's:  Intact  Cognition: WNL  Sleep:  Good   Screenings: AIMS    Flowsheet Row [facility]) [facility]   AIMS Total Score 0 0      AUDIT    Flowsheet Row [facility]) [facility]  Alcohol Use Disorder Identification Test Final Score (AUDIT) 31 0      GAD-7    Flowsheet Row Video Visit from 01/26/2022 in [facility] Video Visit from 11/01/2021 in [facility] Video Visit from 03/22/2021 in [facility] Office Visit from 12/21/2020 in [facility]  Total GAD-7 Score 17 18 20 19       PHQ2-9    Flowsheet Row Video Visit from 01/26/2022 in [facility] Video Visit from 11/01/2021 in [facility] Video Visit from 03/22/2021 in [facility] Counselor from 02/24/2021 in [facility] Office Visit from 12/21/2020 in [facility]  PHQ-2 Total Score 2 2 5 5 5   PHQ-9 Total Score 8 14 17 16 19       Flowsheet Row ED from 01/11/2022 in [facility] from 07/16/2021 [facility] ED from 06/09/2021 in [facility]  C-SSRS RISK CATEGORY No Risk No Risk No Risk        Assessment and Plan: Patient reports that his sleep, depression, and auditory hallucinations has improved since his last visit.  At times he notes that he continues to be anxious and paranoid due to life stressors.  He reports his paranoia started a few weeks ago and stress increased.Today Zoloft 150 mg decreased to 200 mg to help manage symptoms of anxiety and depression.  He will continue all other medications as prescribed.    1. Generalized anxiety disorder  Continue- gabapentin (NEURONTIN) 400 MG capsule; Take 1 capsule (400 mg total) by mouth 3 (three)  times daily.  Dispense: 90 capsule; Refill: 3 Increased- sertraline (ZOLOFT) 100 MG tablet; Take 2 tablets (200 mg total) by mouth daily.  Dispense: 60 tablet; Refill: 3 Continue- traZODone (DESYREL) 50 MG tablet; Take 1-2 tablets (50-100 mg total) by mouth at bedtime.  Dispense: 60 tablet; Refill: 3  2. Schizophrenia spectrum disorder with psychotic disorder type not yet determined (HCC)  Continue- risperidone (  RISPERDAL) 4 MG tablet; Take 1 tablet (4 mg total) by mouth 2 (two) times daily.  Dispense: 60 tablet; Refill: 3   Follow-up in 3 months Shanna Cisco, NP 01/26/2022, 11:22 AM

## 2022-01-27 ENCOUNTER — Other Ambulatory Visit: Payer: Self-pay

## 2022-02-28 ENCOUNTER — Other Ambulatory Visit: Payer: Self-pay

## 2022-03-03 ENCOUNTER — Other Ambulatory Visit: Payer: Self-pay

## 2022-03-10 ENCOUNTER — Other Ambulatory Visit: Payer: Self-pay

## 2022-03-15 ENCOUNTER — Other Ambulatory Visit: Payer: Self-pay

## 2022-03-29 ENCOUNTER — Other Ambulatory Visit: Payer: Self-pay

## 2022-03-30 ENCOUNTER — Other Ambulatory Visit: Payer: Self-pay

## 2022-04-14 ENCOUNTER — Other Ambulatory Visit: Payer: Self-pay

## 2022-04-26 ENCOUNTER — Other Ambulatory Visit: Payer: Self-pay

## 2022-04-28 ENCOUNTER — Other Ambulatory Visit: Payer: Self-pay

## 2022-04-28 ENCOUNTER — Telehealth (INDEPENDENT_AMBULATORY_CARE_PROVIDER_SITE_OTHER): Payer: No Payment, Other | Admitting: Psychiatry

## 2022-04-28 DIAGNOSIS — Z758 Other problems related to medical facilities and other health care: Secondary | ICD-10-CM

## 2022-04-28 DIAGNOSIS — F29 Unspecified psychosis not due to a substance or known physiological condition: Secondary | ICD-10-CM | POA: Diagnosis not present

## 2022-04-28 DIAGNOSIS — F411 Generalized anxiety disorder: Secondary | ICD-10-CM | POA: Diagnosis not present

## 2022-04-28 DIAGNOSIS — F4310 Post-traumatic stress disorder, unspecified: Secondary | ICD-10-CM | POA: Diagnosis not present

## 2022-04-28 MED ORDER — GABAPENTIN 400 MG PO CAPS
400.0000 mg | ORAL_CAPSULE | Freq: Three times a day (TID) | ORAL | 0 refills | Status: DC
  Filled 2022-05-03: qty 90, 30d supply, fill #0

## 2022-04-28 MED ORDER — BUSPIRONE HCL 10 MG PO TABS
10.0000 mg | ORAL_TABLET | Freq: Two times a day (BID) | ORAL | 1 refills | Status: DC
  Filled 2022-04-28: qty 60, 30d supply, fill #0

## 2022-04-28 NOTE — Progress Notes (Signed)
BH MD/PA/NP OP Progress Note Virtual Visit via Video Note  I connected with Jerry [name] on 04/28/22 at 10:30 AM EST by a video enabled telemedicine application and verified that I am speaking with the correct person using two identifiers.  Location: Patient: Motel in [place] where he is living now.  Provider: Clinic   I discussed the limitations of evaluation and management by telemedicine and the availability of in person appointments. The patient expressed understanding and agreed to proceed.   I discussed the assessment and treatment plan with the patient. The patient was provided an opportunity to ask questions and all were answered. The patient agreed with the plan and demonstrated an understanding of the instructions.   The patient was advised to call back or seek an in-person evaluation if the symptoms worsen or if the condition fails to improve as anticipated.  I provided 45 minutes of non-face-to-face time during this encounter.   Armando Reichert, MD    04/28/2022 11:23 AM Jerry [name]  MRN:  [id number]  Chief Complaint: "I have a lot of racing thoughts"   HPI: 47 yo male seen today for follow up psychiatric evaluation.  He has a psychiatric history of  schizophrenia spectrum disorder, GAD, substance induced mood disorder, polysubstance abuse (alcohol, tobacco, methamphetamine, marijuana, and cocaine in remission), PTSD, bipolar I, homelessness, alcohol abuse and malingering.  Currently, he is managed on Risperdal 4 mg twice daily, Zoloft 200 mg daily, gabapentin 400 mg three times daily, and Trazodone 50-100 mg as needed.    Pt reports that his mood is "depressed". He reports that his psychotic symptoms have been controlled with risperidone and he does not have any auditory and visual hallucinations and paranoia.  He reports a lot of racing thoughts and is constantly worried about his current stressors including financial issues, rent, recently lost job last week, his son  using drugs and being disrespectful to his mom and homelessness.  His mom passed away due to COPD and cancer on December 1 and he is still grieving.  He has been living in motel to motel for about 3 years.  He feels depressed because of all the stressors and racing thoughts.  He reports that he has difficulty in falling asleep due to racing thoughts.  He usually gets 6-7 hours of sleep at night.  He reports to appetite. Currently, He denies any suicidal ideations, homicidal ideations, auditory and visual hallucinations.  He reports that last time he saw a shadow of somebody standing 1-1/61-monthago.  He denies paranoia.  He denies any medication side effects and has been tolerating it well.  He denies any involuntary movements, tremors, and stiffness.   He is currently unemployed but previously worked at [employer], art  He is currently living in a motel with his wife and 235year old son and son's girlfriend.  Son is also unemployed, has mental health issues and uses drugs methamphetamine. Wife delivering things but does not earn much.  He stopped drinking alcohol 2 months ago but was drinking heavily for about 5 years.  [place]  He reports multiple previous hospitalization and suicidal attempts.  [facility]  He denies current use of any illicit drugs.  Previously used marijuana (last use 3 months ago ), cocaine  (last use years ago ) methamphetamine (last used years ago ) and heroine.  He smokes 1 pack/day of cigarettes.  [place]   Discussed adding BuSpar to help with anxiety.  Discussed that he needs to come in person in 2-3 weeks for AIMS, weight check,  vitals, updated blood work and EKG. He agrees with the plan.  Recommend him to follow-up with PCP for retesting for hep C.  He is asking referral for PCP.  He denies any other concerns. Patient is alert and oriented x 4,   calm, cooperative, and fully engaged in conversation during the encounter.  His thought process is coherent with coherent speech . He does not appear to be responding to internal/external stimuli .   No facial involuntary movement on video exam.  Visit Diagnosis:    ICD-10-CM   1. Schizophrenia spectrum disorder with psychotic disorder type not yet determined ([facility]  F29     2. Generalized anxiety disorder  F41.1 busPIRone (BUSPAR) 10 MG tablet    gabapentin (NEURONTIN) 400 MG capsule    3. PTSD (post-traumatic stress disorder)  F43.10       Past Psychiatric History: Bipolar disorder, PTSD, depression, anxiety, schizophrenia spectrum disorder, substance-induced mood disorder (marijuana, cocaine, tobacco, methamphetamines,) and malingering  Past Medical History:  Past Medical History:  Diagnosis Date   Anxiety    Bipolar 1 disorder ([place]    Depression    Hallucination    Paranoid schizophrenia ([place]    since pt's 20's   PTSD (post-traumatic stress disorder)     Past Surgical History:  Procedure Laterality Date   KIDNEY DONATION Right 2003    Family Psychiatric History: Mother substance use, son substance use,two sons cutting behaviors  Family History:  Family History  Problem Relation Age of Onset   Heart disease Mother    Cancer Mother    Heart disease Father    Diabetes Father    Diabetes Maternal Grandmother    Stroke Paternal Grandfather     Social History:  Social History   Socioeconomic History   Marital status: Married    Spouse name: Not on file   Number of children: Not on file   Years of education: Not on file   Highest education level: Not on file  Occupational History   Not on file  Tobacco Use   Smoking status: Every Day    Packs/day: 2.00    Types: Cigarettes   Smokeless tobacco: Never  Vaping Use   Vaping Use: Never used  Substance and Sexual Activity   Alcohol use: Yes    Alcohol/week: 32.0 standard drinks of alcohol    Types: 12 Cans of  beer, 20 Shots of liquor per week    Comment: last use 10/28/20- drinks 1/2 gallon vodka daily   Drug use: Not Currently    Types: Marijuana, Cocaine, Heroin, Amphetamines, Methamphetamines    Comment: history of IVDA; last use 10/28/20   Sexual activity: Yes  Other Topics Concern   Not on file  Social History Narrative   Pt has been homeless for the past year and living outdoors for the past week; previously he has been living in hotels. Separated from wife. No family support.   Social Determinants of Health   Financial Resource Strain: Not on file  Food Insecurity: Not on file  Transportation Needs: Not on file  Physical Activity: Not on file  Stress: Not on file  Social Connections: Not on file    Allergies:  Allergies  Allergen Reactions   Penicillins Other (See Comments)    Reaction occurred  in childhood    Metabolic Disorder Labs: Lab Results  Component Value Date   HGBA1C 5.5 10/31/2020   MPG 111.15 10/31/2020   MPG 105.41 07/17/2020   No results found for: "PROLACTIN" Lab Results  Component Value Date   CHOL 152 10/31/2020   TRIG 109 10/31/2020   HDL 48 10/31/2020   CHOLHDL 3.2 10/31/2020   VLDL 22 10/31/2020   LDLCALC 82 10/31/2020   LDLCALC 100 (H) 07/17/2020   Lab Results  Component Value Date   TSH 0.751 07/17/2020   TSH 1.503 06/25/2014    Therapeutic Level Labs: No results found for: "LITHIUM" No results found for: "VALPROATE" No results found for: "CBMZ"  Current Medications: Current Outpatient Medications  Medication Sig Dispense Refill   busPIRone (BUSPAR) 10 MG tablet Take 1 tablet (10 mg total) by mouth 2 (two) times daily. 60 tablet 1   gabapentin (NEURONTIN) 400 MG capsule Take 1 capsule (400 mg total) by mouth 3 (three) times daily. 90 capsule 0   risperidone (RISPERDAL) 4 MG tablet Take 1 tablet (4 mg total) by mouth 2 (two) times daily. 60 tablet 3   sertraline (ZOLOFT) 100 MG tablet Take 2 tablets (200 mg total) by mouth daily. 60  tablet 3   traZODone (DESYREL) 50 MG tablet Take 1-2 tablets (50-100 mg total) by mouth at bedtime. 60 tablet 3   No current facility-administered medications for this visit.     Musculoskeletal: Strength & Muscle Tone:  Unable to assess due to virtual visit [place]: Unable to assess due to virtual visit Patient leans: N/A  Psychiatric Specialty Exam: Review of Systems  There were no vitals taken for this visit.There is no height or weight on file to calculate BMI.  General Appearance: Casual  Eye Contact: Fair  Speech:  Clear and Coherent and Normal Rate  Volume:  Normal  Mood:  Anxious and Depressed  Affect:  Congruent and Constricted  Thought Process:  Coherent, Goal Directed, and Linear  Orientation:  Full (Time, Place, and Person)  Thought Content: Logical   Suicidal Thoughts:  No  Homicidal Thoughts:  No  Memory: Grossly intact  Judgement:  Fair  Insight:  Fair  Psychomotor Activity: Unable to test due to virtual visit  Concentration:  Concentration: Good and Attention Span: Good  Recall: N/A  Fund of Knowledge: Good  Language: Good  Akathisia:   Unable to assess due to telephone visit  Handed:    AIMS (if indicated): Unable to assess due to virtual visit  Assets:  Communication Skills Desire for Improvement Financial Resources/Insurance Housing Physical Health Social Support  ADL's:  Intact  Cognition: WNL  Sleep:  Fair   Screenings: AIMS    Flowsheet Row Admission (Discharged) from 10/30/2020 in [facility] Admission (Discharged) from 07/16/2020 in [facility]  AIMS Total Score 0 0      AUDIT    Flowsheet Row Admission (Discharged) from 10/30/2020 in [place] [facility] Admission (Discharged) from 07/16/2020 in [facility]  Alcohol Use Disorder Identification Test Final Score (AUDIT) 31 0      GAD-7    Flowsheet Row  Video Visit from 01/26/2022 in [facility] Video Visit from 11/01/2021 in [facility] Video Visit from 03/22/2021 in [facility] Office Visit from 12/21/2020 in [facility]  Total GAD-7 Score 17 18 20 $ 19  [id number]    Flowsheet Row Video Visit from 01/26/2022 in [facility] Video Visit from 11/01/2021 in [facility] Video Visit from 03/22/2021 in [facility] Counselor from 02/24/2021 in [facility] Office Visit from 12/21/2020 in [place]  PHQ-2 Total Score 2 2 5 5 5  $ PHQ-9 Total Score 8 14 17 16 19      $ [place] [facility] from 01/11/2022 in [facility] ED from 07/16/2021 in [facility] Emergency Department at [facility] ED from 06/09/2021 in [facility] Emergency Department at [place] No Risk No Risk No Risk        Assessment and Plan: Patient reports that hallucinations and paranoia are controlled on risperidone but he is having a lot of anxiety and constantly worried about his psychosocial stressors.  Will add BuSpar to help with anxiety.  Patient is currently on higher dose of risperidone.  Will continue at same dose at this time as he is currently stable and is not reporting any side effects.  He will need in person follow-up in 2-3 weeks for AIMS, weight check,  vitals, updated blood work and EKG. Recommend him to follow-up with PCP for retesting for  hep C.  Will put referral for PCP.  Labs reviewed-07/16/2021-sodium 133, creatinine 0.89, AST 147, ALT 157, WBC 11.5 8/22-HbA1c 5.5, lipid panel WNL 6/22-TSH WNL 8/22 EKG -QTc 420  Generalized anxiety disorder PTSD Continue- gabapentin (NEURONTIN) 400 MG capsule; Take 1 capsule (400 mg total) by mouth 3 (three) times  daily.  Dispense: 90 capsule; Refill: 0 Continue sertraline (ZOLOFT) 200 MG tablet; Take 2 tablets (200 mg total) by mouth daily.   Continue- traZODone (DESYREL) 50 MG tablet; Take 1-2 tablets (50-100 mg total) by mouth at bedtime.  Dispense: 60 tablet; Refill: 3 Start BuSpar 10 mg twice daily to help with anxiety.  30-day prescription with 1 refill sent to patient's pharmacy  Schizophrenia spectrum disorder with psychotic disorder type not yet determined ([facility])  Continue- risperidone (RISPERDAL) 4 MG tablet; Take 1 tablet (4 mg total) by mouth 2 (two) times daily.   -Next follow-up in person in 2-3 weeks to check for AIMS, weight,  vitals,  blood work and EKG.   Does not have primary care -Ambulatory referral to internal medicine resident clinic  Follow-up in person in 2-3 weeks   Armando Reichert, MD 04/28/2022, 11:23 AM

## 2022-05-01 ENCOUNTER — Encounter (HOSPITAL_COMMUNITY): Payer: Self-pay | Admitting: Psychiatry

## 2022-05-01 ENCOUNTER — Other Ambulatory Visit: Payer: Self-pay

## 2022-05-03 ENCOUNTER — Other Ambulatory Visit: Payer: Self-pay

## 2022-05-05 ENCOUNTER — Other Ambulatory Visit: Payer: Self-pay
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
## 2022-05-15 ENCOUNTER — Other Ambulatory Visit: Payer: Self-pay

## 2022-05-19 ENCOUNTER — Encounter (HOSPITAL_COMMUNITY): Payer: Self-pay | Admitting: Psychiatry

## 2022-05-19 ENCOUNTER — Other Ambulatory Visit: Payer: Self-pay

## 2022-05-19 ENCOUNTER — Ambulatory Visit (INDEPENDENT_AMBULATORY_CARE_PROVIDER_SITE_OTHER): Payer: No Payment, Other | Admitting: Psychiatry

## 2022-05-19 VITALS — BP 115/77 | HR 52 | Wt 213.0 lb

## 2022-05-19 DIAGNOSIS — G2401 Drug induced subacute dyskinesia: Secondary | ICD-10-CM

## 2022-05-19 DIAGNOSIS — Z79899 Other long term (current) drug therapy: Secondary | ICD-10-CM | POA: Diagnosis not present

## 2022-05-19 DIAGNOSIS — F4310 Post-traumatic stress disorder, unspecified: Secondary | ICD-10-CM | POA: Diagnosis not present

## 2022-05-19 DIAGNOSIS — F411 Generalized anxiety disorder: Secondary | ICD-10-CM

## 2022-05-19 DIAGNOSIS — F29 Unspecified psychosis not due to a substance or known physiological condition: Secondary | ICD-10-CM | POA: Diagnosis not present

## 2022-05-19 MED ORDER — BUSPIRONE HCL 10 MG PO TABS
15.0000 mg | ORAL_TABLET | Freq: Two times a day (BID) | ORAL | 1 refills | Status: DC
  Filled 2022-05-19 (×2): qty 60, 20d supply, fill #0

## 2022-05-19 MED ORDER — VALBENAZINE TOSYLATE 40 MG PO CAPS
40.0000 mg | ORAL_CAPSULE | Freq: Every day | ORAL | 2 refills | Status: DC
  Filled 2022-05-19: qty 30, 30d supply, fill #0

## 2022-05-19 MED ORDER — GABAPENTIN 400 MG PO CAPS
400.0000 mg | ORAL_CAPSULE | Freq: Three times a day (TID) | ORAL | 0 refills | Status: DC
  Filled 2022-05-19 – 2022-06-02 (×2): qty 90, 30d supply, fill #0

## 2022-05-19 MED ORDER — RISPERIDONE 3 MG PO TABS
3.0000 mg | ORAL_TABLET | Freq: Two times a day (BID) | ORAL | 1 refills | Status: DC
  Filled 2022-05-19 – 2022-06-02 (×2): qty 60, 30d supply, fill #0

## 2022-05-19 NOTE — Progress Notes (Signed)
BH MD/PA/NP OP Progress Note  05/19/2022 10:58 AM Jerry [name]  MRN:  [id number]  Chief Complaint: follow up   HPI: 47 yo male seen today for follow up psychiatric evaluation.  He has a psychiatric history of  schizophrenia spectrum disorder, GAD, substance induced mood disorder, polysubstance abuse (alcohol, tobacco, methamphetamine, marijuana, and cocaine in remission), PTSD, bipolar I, homelessness, alcohol abuse and malingering.  Currently, he is managed on Risperdal 4 mg twice daily, Zoloft 200 mg daily, gabapentin 400 mg three times daily, BuSpar 10 mg twice daily and Trazodone 50-100 mg as needed.   Pt reports that his mood is "anxious". He reports that his psychotic symptoms have been controlled with risperidone and he does not have any auditory and visual hallucinations but has some paranoia.  He reports a lot of racing thoughts and is constantly worried about his current stressors including financial issues, rent, losing job, son using drugs and not taking his medications and homelessness.  His mom passed away due to COPD and cancer on December 1 and he is still grieving.  He has been living in motel to motel for about 3 years.  He feels anxious because of all the stressors and racing thoughts.  He reports difficulty in falling and staying asleep due to anxiety.  He usually gets 4-6 hours of sleep at night.  He reports stable appetite. Currently, He denies any suicidal ideations, homicidal ideations, auditory and visual hallucinations.  He is still reporting paranoia and thinks that something bad would happen to him.  He denies any medication side effects and has been tolerating it well.   On exam, tardive dyskinesia involving movement of mouth and tongue observed.  No tremors or stiffness.  AIMS done scored at 53   He is currently living in a motel with his wife, 51 year old son and son's girlfriend.  Son is also unemployed, has mental health issues and uses drugs methamphetamine. Wife  delivering things but does not earn much.  He quit drinking alcohol 2-3 months ago but was drinking heavily for about 5 years.  He had been to multiple rehabs in [place], [place] and [place].  He uses marijuana every other day.  Educated about the negative effects of marijuana and recommend complete cessation.  He was also using cocaine in the past (last use years ago ) methamphetamine (last used years ago ) and heroine.  He smokes 1 pack/day of cigarettes.  He has not established care with internal medicine clinic for PCP.  Phone number given to patient.  He will call internal medicine clinic for PCP and to get EKG done. Discussed increasing BuSpar to help with anxiety.  Discussed starting Ingrezza to help with tardive dyskinesias and decreasing the dose of risperidone as he is on higher dose.  He agrees with the plan.  Blood work ordered for clinic.  He will come next week to get blood work done.   Patient is alert and oriented x 4, anxious, cooperative, and fully engaged in conversation during the encounter.  His thought process is coherent with coherent speech . He does not appear to be responding to internal/external stimuli .  Visit Diagnosis:    ICD-10-CM   1. Schizophrenia spectrum disorder with psychotic disorder type not yet determined (HCC)  F29 risperiDONE (RISPERDAL) 3 MG tablet    2. Long term current use of antipsychotic medication  Z79.899 CBC w/Diff/Platelet    COMPLETE METABOLIC PANEL WITH GFR    Lipid Profile    HgB A1c  TSH    EKG 12-Lead    3. Generalized anxiety disorder  F41.1 gabapentin (NEURONTIN) 400 MG capsule    busPIRone (BUSPAR) 10 MG tablet    4. PTSD (post-traumatic stress disorder)  F43.10     5. Tardive dyskinesia  G24.01 valbenazine (INGREZZA) 40 MG capsule      Past Psychiatric History: Bipolar disorder, PTSD, depression, anxiety, schizophrenia spectrum disorder, substance-induced mood disorder (marijuana, cocaine, tobacco, methamphetamines,) and malingering  Past  Medical History:  Past Medical History:  Diagnosis Date   Anxiety    Bipolar 1 disorder ([place])    Depression    Hallucination    Paranoid schizophrenia ([place])    since pt's 20's   PTSD (post-traumatic stress disorder)     Past Surgical History:  Procedure Laterality Date   KIDNEY DONATION Right 2003    Family Psychiatric History: Mother substance use, son substance use,two sons cutting behaviors  Family History:  Family History  Problem Relation Age of Onset   Heart disease Mother    Cancer Mother    Heart disease Father    Diabetes Father    Diabetes Maternal Grandmother    Stroke Paternal Grandfather     Social History:  Social History   Socioeconomic History   Marital status: Married    Spouse name: Not on file   Number of children: Not on file   Years of education: Not on file   Highest education level: Not on file  Occupational History   Not on file  Tobacco Use   Smoking status: Every Day    Packs/day: 2.00    Types: Cigarettes   Smokeless tobacco: Never  Vaping Use   Vaping Use: Never used  Substance and Sexual Activity   Alcohol use: Yes    Alcohol/week: 32.0 standard drinks of alcohol    Types: 12 Cans of beer, 20 Shots of liquor per week    Comment: last use 10/28/20- drinks 1/2 gallon vodka daily   Drug use: Not Currently    Types: Marijuana, Cocaine, Heroin, Amphetamines, Methamphetamines    Comment: history of IVDA; last use 10/28/20   Sexual activity: Yes  Other Topics Concern   Not on file  Social History Narrative   Pt has been homeless for the past year and living outdoors for the past week; previously he has been living in hotels. Separated from wife. No family support.   Social Determinants of Health   Financial Resource Strain: Not on file  Food Insecurity: Not on file  Transportation Needs: Not on file  Physical Activity: Not on file  Stress: Not on file  Social Connections: Not on file    Allergies:  Allergies  Allergen  Reactions   Penicillins Other (See Comments)    Reaction occurred in childhood    Metabolic Disorder Labs: Lab Results  Component Value Date   HGBA1C 5.5 10/31/2020   MPG 111.15 10/31/2020   MPG 105.41 07/17/2020   No results found for: "PROLACTIN" Lab Results  Component Value Date   CHOL 152 10/31/2020   TRIG 109 10/31/2020   HDL 48 10/31/2020   CHOLHDL 3.2 10/31/2020   VLDL 22 10/31/2020   LDLCALC 82 10/31/2020   LDLCALC 100 (H) 07/17/2020   Lab Results  Component Value Date   TSH 0.751 07/17/2020   TSH 1.503 06/25/2014    Therapeutic Level Labs: No results found for: "LITHIUM" No results found for: "VALPROATE" No results found for: "CBMZ"  Current Medications: Current Outpatient Medications  Medication  Sig Dispense Refill   valbenazine (INGREZZA) 40 MG capsule Take 1 capsule (40 mg total) by mouth daily. 30 capsule 2   busPIRone (BUSPAR) 10 MG tablet Take 1.5 tablets (15 mg total) by mouth 2 (two) times daily. 60 tablet 1   gabapentin (NEURONTIN) 400 MG capsule Take 1 capsule (400 mg total) by mouth 3 (three) times daily. 90 capsule 0   risperiDONE (RISPERDAL) 3 MG tablet Take 1 tablet (3 mg total) by mouth 2 (two) times daily. 60 tablet 1   sertraline (ZOLOFT) 100 MG tablet Take 2 tablets (200 mg total) by mouth daily. 60 tablet 3   traZODone (DESYREL) 50 MG tablet Take 1-2 tablets (50-100 mg total) by mouth at bedtime. 60 tablet 3   No current facility-administered medications for this visit.     Musculoskeletal: Strength & Muscle Tone: normal Gait & Station: normal Patient leans: N/A  Psychiatric Specialty Exam: Review of Systems  Blood pressure 115/77, pulse (!) 52, weight 213 lb (96.6 kg), SpO2 100 %.Body mass index is 31.45 kg/m.  General Appearance: Casual  Eye Contact: Fair  Speech:  Clear and Coherent and Normal Rate  Volume:  Normal  Mood:  Anxious  Affect:  Congruent and Constricted  Thought Process:  Coherent, Goal Directed, and Linear   Orientation:  Full (Time, Place, and Person)  Thought Content: Paranoid Ideation   Suicidal Thoughts:  No  Homicidal Thoughts:  No  Memory: Grossly intact  Judgement:  Fair  Insight:  Fair  Psychomotor Activity: normal, no tremors  Concentration:  Concentration: Good and Attention Span: Good  Recall: N/A  Fund of Knowledge: Good  Language: Good  Akathisia:   none  but TD's present in tongue and lips  Handed:    AIMS (if indicated): done, 10  Assets:  Communication Skills Desire for Improvement Financial Resources/Insurance [place] Support  ADL's:  Intact  Cognition: WNL  Sleep:  Fair   Screenings: AIMS    Flowsheet Row Clinical Support from 05/19/2022 in [facility] Admission (Discharged) from 10/30/2020 in [place] [facility] Admission (Discharged) from 07/16/2020 in [place] Total Score 10 0 0      AUDIT    Flowsheet Row Admission (Discharged) from 10/30/2020 in [place] [facility] Admission (Discharged) from 07/16/2020 in [place] [facility]  Alcohol Use Disorder Identification Test Final Score (AUDIT) 31 0      GAD-7    Flowsheet Row Video Visit from 01/26/2022 in [facility] Video Visit from 11/01/2021 in [facility] Video Visit from 03/22/2021 in [facility] Office Visit from 12/21/2020 in [facility]  Total GAD-7 Score '17 18 20 19      '$ PHQ2-9    Flowsheet Row Video Visit from 01/26/2022 in [facility] Video Visit from 11/01/2021 in [facility] Video Visit from 03/22/2021 in [facility] Counselor from 02/24/2021 in [facility] Office Visit from 12/21/2020 in [place]  PHQ-2 Total Score '2 2 5 5 5  '$ PHQ-9 Total Score '8 14 17 16 19      '$ Flowsheet Row ED from 01/11/2022 in [facility] ED from 07/16/2021 in [facility] ED from 06/09/2021 in [facility] at  [facility]  C-SSRS RISK CATEGORY No Risk No Risk No Risk        Assessment and Plan: Patient reports that hallucinations are controlled but still having some paranoia and lot of anxiety. He is constantly worried about his psychosocial stressors.  BuSpar was added at last visit.  Will increase dose of BuSpar to help with anxiety.  Involuntary movement of mouth and tongue observed on exam.  Aims done scored at 10.  Will start patient on Ingrezza to help with TD's,  and will decrease risperidone as patient is on higher dose of risperidone.  He will call internal medicine clinic for PCP and EKG.  Phone number given to patient.  Labs ordered patient will get it done next week.  Labs reviewed-07/16/2021-sodium 133, creatinine 0.89, AST 147, ALT 157, WBC 11.5 8/22-HbA1c 5.5, lipid panel WNL 6/22-TSH WNL 8/22 EKG -QTc 420 Repeat lab work and EKG ordered.  Generalized anxiety disorder PTSD Continue- gabapentin (NEURONTIN) 400 MG capsule; Take 1 capsule (400 mg total) by mouth 3 (three) times daily.  Dispense: 90 capsule; Refill: 2 Continue sertraline (ZOLOFT) 200 MG tablet; Take 2 tablets (200 mg total) by mouth daily.   Continue- traZODone (DESYREL) 50 MG tablet; Take 1-2 tablets (50-100 mg total) by mouth at bedtime.  Increase BuSpar to 15 mg twice daily to help with anxiety.  30-day prescription with 1 refill sent to patient's pharmacy  Schizophrenia spectrum disorder with psychotic disorder type not yet determined ([place])  Decrease risperidone (RISPERDAL) to 3 MG two times daily.   Long-term current use of antipsychotic. -CBC, CMP, HbA1c, lipid panel and EKG ordered.   Tardive  dyskinesia -Start Ingrezza 40 mg daily.  Does not have primary care -Ambulatory referral to internal medicine resident clinic sent at last visit.  Phone number given to patient -Patient will get EKG done at PCP office.  Follow-up in person in 2 months  Armando Reichert, MD 05/19/2022, 10:58 AM

## 2022-05-22 ENCOUNTER — Other Ambulatory Visit (HOSPITAL_COMMUNITY): Payer: No Payment, Other

## 2022-05-22 ENCOUNTER — Other Ambulatory Visit: Payer: Self-pay

## 2022-05-23 ENCOUNTER — Other Ambulatory Visit: Payer: Self-pay

## 2022-05-30 ENCOUNTER — Other Ambulatory Visit: Payer: Self-pay

## 2022-05-31 ENCOUNTER — Other Ambulatory Visit (HOSPITAL_COMMUNITY): Payer: No Payment, Other

## 2022-06-02 ENCOUNTER — Other Ambulatory Visit: Payer: Self-pay

## 2022-06-12 ENCOUNTER — Encounter: Payer: Self-pay | Admitting: Student

## 2022-06-13 ENCOUNTER — Other Ambulatory Visit: Payer: Self-pay

## 2022-06-15 ENCOUNTER — Other Ambulatory Visit: Payer: Self-pay

## 2022-06-21 ENCOUNTER — Other Ambulatory Visit: Payer: Self-pay

## 2022-06-26 ENCOUNTER — Other Ambulatory Visit: Payer: Self-pay

## 2022-07-05 ENCOUNTER — Telehealth (HOSPITAL_COMMUNITY): Payer: Self-pay | Admitting: *Deleted

## 2022-07-05 NOTE — Telephone Encounter (Signed)
Called the number listed on patient contact and spoke with "Jerry [name]" who states she is his girlfriend whom he calls his spouse. Was going to ask questions with [place] to get patient assistance for [facility] but patient has left his home and living on the street as far as she knows. Jerry [name] states he left 2 weeks ago and she has not heard from him but knows that he was off his medications and has started using street drugs and alcohol again. If she hears from him she will relay the message to call us.

## 2022-07-07 ENCOUNTER — Emergency Department (HOSPITAL_COMMUNITY)
Admission: EM | Admit: 2022-07-07 | Discharge: 2022-07-09 | Disposition: A | Discharge status: Home / Self Care | Payer: Self-pay | Attending: Emergency Medicine | Admitting: Emergency Medicine

## 2022-07-07 ENCOUNTER — Encounter (HOSPITAL_COMMUNITY): Payer: Self-pay

## 2022-07-07 ENCOUNTER — Other Ambulatory Visit: Payer: Self-pay

## 2022-07-07 DIAGNOSIS — F2089 Other schizophrenia: Secondary | ICD-10-CM

## 2022-07-07 DIAGNOSIS — F1910 Other psychoactive substance abuse, uncomplicated: Secondary | ICD-10-CM | POA: Diagnosis present

## 2022-07-07 DIAGNOSIS — Z1152 Encounter for screening for COVID-19: Secondary | ICD-10-CM | POA: Insufficient documentation

## 2022-07-07 DIAGNOSIS — F1010 Alcohol abuse, uncomplicated: Secondary | ICD-10-CM | POA: Insufficient documentation

## 2022-07-07 DIAGNOSIS — F209 Schizophrenia, unspecified: Secondary | ICD-10-CM | POA: Insufficient documentation

## 2022-07-07 LAB — CBC WITH DIFFERENTIAL/PLATELET
Abs Immature Granulocytes: 0.04 10*3/uL (ref 0.00–0.07)
Basophils Absolute: 0.1 10*3/uL (ref 0.0–0.1)
Basophils Relative: 1 %
Eosinophils Absolute: 0 10*3/uL (ref 0.0–0.5)
Eosinophils Relative: 0 %
HCT: 47.6 % (ref 39.0–52.0)
Hemoglobin: 16.5 g/dL (ref 13.0–17.0)
Immature Granulocytes: 1 %
Lymphocytes Relative: 27 %
Lymphs Abs: 1.8 10*3/uL (ref 0.7–4.0)
MCH: 32.2 pg (ref 26.0–34.0)
MCHC: 34.7 g/dL (ref 30.0–36.0)
MCV: 93 fL (ref 80.0–100.0)
Monocytes Absolute: 0.8 10*3/uL (ref 0.1–1.0)
Monocytes Relative: 12 %
Neutro Abs: 4 10*3/uL (ref 1.7–7.7)
Neutrophils Relative %: 59 %
Platelets: 276 10*3/uL (ref 150–400)
RBC: 5.12 MIL/uL (ref 4.22–5.81)
RDW: 12.6 % (ref 11.5–15.5)
WBC: 6.8 10*3/uL (ref 4.0–10.5)
nRBC: 0 % (ref 0.0–0.2)

## 2022-07-07 LAB — COMPREHENSIVE METABOLIC PANEL
ALT: 38 U/L (ref 0–44)
AST: 57 U/L — ABNORMAL HIGH (ref 15–41)
Albumin: 3.2 g/dL — ABNORMAL LOW (ref 3.5–5.0)
Alkaline Phosphatase: 51 U/L (ref 38–126)
Anion gap: 16 — ABNORMAL HIGH (ref 5–15)
BUN: 6 mg/dL (ref 6–20)
CO2: 18 mmol/L — ABNORMAL LOW (ref 22–32)
Calcium: 7.8 mg/dL — ABNORMAL LOW (ref 8.9–10.3)
Chloride: 95 mmol/L — ABNORMAL LOW (ref 98–111)
Creatinine, Ser: 0.75 mg/dL (ref 0.61–1.24)
GFR, Estimated: >60 mL/min (ref >60)
Glucose, Bld: 97 mg/dL (ref 70–99)
Potassium: 3 mmol/L — ABNORMAL LOW (ref 3.5–5.1)
Sodium: 129 mmol/L — ABNORMAL LOW (ref 135–145)
Total Bilirubin: 0.9 mg/dL (ref 0.3–1.2)
Total Protein: 8.1 g/dL (ref 6.5–8.1)

## 2022-07-07 LAB — RAPID URINE DRUG SCREEN, HOSP PERFORMED
Amphetamines: NOT DETECTED
Barbiturates: NOT DETECTED
Benzodiazepines: NOT DETECTED
Cocaine: NOT DETECTED
Opiates: NOT DETECTED
Tetrahydrocannabinol: POSITIVE — AB

## 2022-07-07 LAB — ETHANOL: Alcohol, Ethyl (B): 175 mg/dL — ABNORMAL HIGH (ref <10)

## 2022-07-07 LAB — SARS CORONAVIRUS 2 BY RT PCR: SARS Coronavirus 2 by RT PCR: NEGATIVE

## 2022-07-07 MED ORDER — ONDANSETRON HCL 4 MG PO TABS
4.0000 mg | ORAL_TABLET | Freq: Three times a day (TID) | ORAL | Status: DC | PRN
  Administered 2022-07-07: 4 mg via ORAL
  Filled 2022-07-07: qty 1

## 2022-07-07 MED ORDER — RISPERIDONE 2 MG PO TABS
3.0000 mg | ORAL_TABLET | Freq: Two times a day (BID) | ORAL | Status: DC
  Administered 2022-07-07 – 2022-07-09 (×5): 3 mg via ORAL
  Filled 2022-07-07 (×5): qty 1

## 2022-07-07 MED ORDER — POTASSIUM CHLORIDE CRYS ER 20 MEQ PO TBCR
60.0000 meq | EXTENDED_RELEASE_TABLET | Freq: Once | ORAL | Status: AC
  Administered 2022-07-07: 60 meq via ORAL
  Filled 2022-07-07: qty 3

## 2022-07-07 MED ORDER — LORAZEPAM 1 MG PO TABS
0.0000 mg | ORAL_TABLET | Freq: Four times a day (QID) | ORAL | Status: AC
  Administered 2022-07-07: 2 mg via ORAL
  Administered 2022-07-07 (×2): 1 mg via ORAL
  Filled 2022-07-07: qty 2
  Filled 2022-07-07 (×2): qty 1

## 2022-07-07 MED ORDER — LORAZEPAM 2 MG/ML IJ SOLN
0.0000 mg | Freq: Four times a day (QID) | INTRAMUSCULAR | Status: AC
  Administered 2022-07-07: 2 mg via INTRAVENOUS
  Filled 2022-07-07: qty 1

## 2022-07-07 MED ORDER — LORAZEPAM 1 MG PO TABS: 0.0000 mg | ORAL_TABLET | Freq: Two times a day (BID) | ORAL | Status: DC

## 2022-07-07 MED ORDER — POTASSIUM CHLORIDE CRYS ER 20 MEQ PO TBCR
40.0000 meq | EXTENDED_RELEASE_TABLET | Freq: Once | ORAL | Status: AC
  Administered 2022-07-07: 40 meq via ORAL
  Filled 2022-07-07: qty 2

## 2022-07-07 MED ORDER — GABAPENTIN 400 MG PO CAPS
400.0000 mg | ORAL_CAPSULE | Freq: Three times a day (TID) | ORAL | Status: DC
  Administered 2022-07-07 – 2022-07-09 (×6): 400 mg via ORAL
  Filled 2022-07-07 (×6): qty 1

## 2022-07-07 MED ORDER — TRAZODONE HCL 50 MG PO TABS
50.0000 mg | ORAL_TABLET | Freq: Every evening | ORAL | Status: DC | PRN
  Administered 2022-07-07 – 2022-07-09 (×3): 50 mg via ORAL
  Filled 2022-07-07 (×3): qty 1

## 2022-07-07 MED ORDER — THIAMINE MONONITRATE 100 MG PO TABS
100.0000 mg | ORAL_TABLET | Freq: Every day | ORAL | Status: DC
  Administered 2022-07-08 – 2022-07-09 (×2): 100 mg via ORAL
  Filled 2022-07-07 (×2): qty 1

## 2022-07-07 MED ORDER — LORAZEPAM 2 MG/ML IJ SOLN: 0.0000 mg | Freq: Two times a day (BID) | INTRAMUSCULAR | Status: DC

## 2022-07-07 MED ORDER — VALBENAZINE TOSYLATE 40 MG PO CAPS: 40.0000 mg | ORAL_CAPSULE | Freq: Every day | ORAL | Status: DC

## 2022-07-07 MED ORDER — SERTRALINE HCL 50 MG PO TABS
200.0000 mg | ORAL_TABLET | Freq: Every day | ORAL | Status: DC
  Administered 2022-07-07 – 2022-07-09 (×3): 200 mg via ORAL
  Filled 2022-07-07 (×3): qty 4

## 2022-07-07 MED ORDER — THIAMINE HCL 100 MG/ML IJ SOLN
100.0000 mg | Freq: Every day | INTRAMUSCULAR | Status: DC
  Administered 2022-07-07: 100 mg via INTRAVENOUS
  Filled 2022-07-07: qty 2

## 2022-07-07 MED ORDER — BUSPIRONE HCL 10 MG PO TABS
15.0000 mg | ORAL_TABLET | Freq: Two times a day (BID) | ORAL | Status: DC
  Administered 2022-07-07 – 2022-07-09 (×5): 15 mg via ORAL
  Filled 2022-07-07 (×5): qty 2

## 2022-07-07 NOTE — ED Notes (Signed)
Patient sleeping. Breaths equal and unlabored.
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
## 2022-07-07 NOTE — Progress Notes (Signed)
LCSW Progress Note  [id number]   Jerry [name]  07/07/2022  11:49 AM  Description:   Inpatient Psychiatric Referral  Patient was recommended inpatient per [employer] [facility], PMHNP. There are no available beds at [facility]. Patient was referred to the following facilities:   Destination  Service Provider Address Phone Fax  [facility]  [address] [place] [address] [phone]  [facility]  [address] [place] [address] [phone]  [facility]  [address] [place] [address] [phone]  [facility]  [address] [place], [place] [address] [phone]  [facility] [address] [place]  [address], [place] [address] [phone]  [facility]  [address] [place], [place] [address] [phone]  [facility]  [address] [place] [address] [phone]  [facility]  [address] [place] [address] [phone]  [facility]  [address] [place] [address] [phone]  [facility]  [address] [place], [place] [address] [phone]  [facility]  [address] [place] [address] [phone]  [facility]  [address] [place] [address] [phone]  [facility]  [address] [place] [address] [phone]  [facility]  [address] [place] [address] [phone]  [facility]  [address], [place] [address] [phone]  [facility]  [address], [place] [address] [phone]   [facility]  [address], [place] [address] [phone]  [facility]  [address] [place] [address] [phone]  [facility]  [address] [place] [address] [phone]  [facility]  [address] [place] [address] [phone]  [facility]  [address], [place] [address] [phone]  [facility]  [address] [place] [address] [phone]  [facility]  [address], [place] [address] [phone]  [facility]  [address], [place] [address] [phone]    Situation ongoing, CSW to continue following and update chart as more information becomes available.      Cathie Beams, Theresia Majors  07/07/2022 11:49 AM

## 2022-07-07 NOTE — ED Notes (Signed)
Patient cooperative. Patient has been medication compliant. Patient has been sleeping. Patient is eating dinner.

## 2022-07-07 NOTE — ED Notes (Signed)
Patient to room 30. Patient ambulated to room.  Patient oriented to unit and room.

## 2022-07-07 NOTE — ED Notes (Addendum)
Pt changed out in scrubs. One pt belonging bag in cabinet 16-18. Security at bedside to wand pt.

## 2022-07-07 NOTE — ED Notes (Signed)
Pt is asleep no signs of withdrawal skin is P/W/D and no tremors detected and no c/o pain or discomfort noted.

## 2022-07-07 NOTE — Consult Note (Signed)
[facility] ED ASSESSMENT   Reason for Consult:  Psych Consult Referring Physician:  Dr. Freida Busman Patient Identification: Jerry [name] MRN:  [id number] ED Chief Complaint: Polysubstance abuse  Diagnosis:  Principal Problem:   Polysubstance abuse (HCC) Active Problems:   Schizophrenia   ED Assessment Time Calculation: Start Time: 0945 Stop Time: 1020 Total Time in Minutes (Assessment Completion): 35   HPI: Per Triage Note: Patient reports he has been drinking every day for a month, a 5th of vodka and unknown amount of beers. States he wants to quit but has been through this before and has had seizures from withdrawal. Last drink was a couple hours ago. Has been off home meds as well for a month, states he left home after issues with wife.    Subjective: Jerry [name], 47 y.o., male patient seen face to face by this provider, consulted with Dr. Lucianne Muss; and chart reviewed on 07/07/22.  On evaluation Jerry [name] reports that he and his girlfriend got into a fight, he left the hotel where they were staying and that, and says he has been wandering on the streets.  He states that things were stressful with he and his girlfriend and his son, states that no one works, says he tries to pick up a job from time to time, to support him, his girlfriend and his son.  He states he worries about his bills, his housing.  He states that his oldest son is 8 years old, and he and his girlfriend stay with the patient and his girlfriend, states that the son is addicted to methamphetamines he and his girlfriend, and they will not work to support their habit, and he does not know how to help them stop using.  He states that he lost his medications and he has been having some suicidal thoughts, denies HI but endorses auditory and visual hallucinations says that he hears a quiet voice in his head telling him to do terrible things to himself, says that he sees traces of people's faces that he does not know that haunt him.  He states  he constantly hears voices.  Patient states that his appetite and sleep are poor due to being homeless.  Patient discussed with provider that since he has been homeless he has been drinking about 1/5 of vodka daily, patient also endorses using THC. He states that he has a past history of psychiatric hospitalizations, states he is unsure of his diagnosis says that he has been compliant with medications before they were lost, unable to name the medications.  During evaluation Jerry [name] is laying in his hospital gurney in no acute distress. He is alert, oriented x 3, calm, cooperative and attentive. His mood is sad with congruent/flat affect.  He has normal speech, at times appears to be slurred, and appropriate behavior.  Objectively there is no evidence of psychosis/mania or delusional thinking.  Patient is able to converse coherently, no distractibility, or pre-occupation.  He denies homicidal ideation, psychosis, and paranoia.  Patient endorses suicidal ideation, states he has no plan at this time.  Patient answered question appropriately.  Patient UDS positive for THC, BAL is 175.  Patient started on the Ativan protocol with CIWA.  Per chart review patient was following up with Toy Cookey at [facility] outpatient back in June 2023.   Past Psychiatric History: Bipolar disorder, PTSD, depression, anxiety, schizophrenia spectrum disorder, substance-induced mood disorder (marijuana, cocaine, tobacco, methamphetamines,) and malingering   Risk to Self:  Passive SI  Risk to Others:  No  Prior Inpatient Therapy:  Yes  Prior Outpatient Therapy: Yes    [place] Scale:  Flowsheet Row ED from 07/07/2022 in [facility] Emergency Department at [facility] ED from 01/11/2022 in [facility] at [facility] ED from 07/16/2021 in [facility] Emergency Department at [facility]  C-SSRS RISK CATEGORY No Risk No Risk No Risk       AIMS:  , , ,  ,   ASAM:    Substance  Abuse:     Past Medical History:  Past Medical History:  Diagnosis Date   Anxiety    Bipolar 1 disorder    Depression    Hallucination    Paranoid schizophrenia    since pt's 20's   PTSD (post-traumatic stress disorder)     Past Surgical History:  Procedure Laterality Date   KIDNEY DONATION Right 2003   Family History:  Family History  Problem Relation Age of Onset   Heart disease Mother    Cancer Mother    Heart disease Father    Diabetes Father    Diabetes Maternal Grandmother    Stroke Paternal Grandfather     Social History:  Social History   Substance and Sexual Activity  Alcohol Use Yes   Alcohol/week: 32.0 standard drinks of alcohol   Types: 12 Cans of beer, 20 Shots of liquor per week   Comment: last use 10/28/20- drinks 1/2 gallon vodka daily     Social History   Substance and Sexual Activity  Drug Use Not Currently   Types: Marijuana, Cocaine, Heroin, Amphetamines, Methamphetamines   Comment: history of IVDA; last use 10/28/20    Social History   Socioeconomic History   Marital status: Married    Spouse name: Not on file   Number of children: Not on file   Years of education: Not on file   Highest education level: Not on file  Occupational History   Not on file  Tobacco Use   Smoking status: Every Day    Packs/day: 2    Types: Cigarettes   Smokeless tobacco: Never  Vaping Use   Vaping Use: Never used  Substance and Sexual Activity   Alcohol use: Yes    Alcohol/week: 32.0 standard drinks of alcohol    Types: 12 Cans of beer, 20 Shots of liquor per week    Comment: last use 10/28/20- drinks 1/2 gallon vodka daily   Drug use: Not Currently    Types: Marijuana, Cocaine, Heroin, Amphetamines, Methamphetamines    Comment: history of IVDA; last use 10/28/20   Sexual activity: Yes  Other Topics Concern   Not on file  Social History Narrative   Pt has been homeless for the past year and living outdoors for the past week; previously he has been  living in hotels. Separated from wife. No family support.   Social Determinants of Health   Financial Resource Strain: Not on file  Food Insecurity: Not on file  Transportation Needs: Not on file  Physical Activity: Not on file  Stress: Not on file  Social Connections: Not on file   Additional Social History:    Allergies:   Allergies  Allergen Reactions   Penicillins Other (See Comments)    Reaction occurred in childhood    Labs:  Results for orders placed or performed during the hospital encounter of 07/07/22 (from the past 48 hour(s))  CBC with Differential     Status: None   Collection Time: 07/07/22  4:43 AM  Result Value Ref Range   WBC 6.8 4.0 - 10.5 K/uL   RBC 5.12 4.22 - 5.81 MIL/uL   Hemoglobin 16.5 13.0 - 17.0 g/dL   HCT 82.9 56.2 - 13.0 %   MCV 93.0 80.0 - 100.0 fL   MCH 32.2 26.0 - 34.0 pg   MCHC 34.7 30.0 - 36.0 g/dL   RDW 86.5 78.4 - 69.6 %   Platelets 276 150 - 400 K/uL   nRBC 0.0 0.0 - 0.2 %   Neutrophils Relative % 59 %   Neutro Abs 4.0 1.7 - 7.7 K/uL   Lymphocytes Relative 27 %   Lymphs Abs 1.8 0.7 - 4.0 K/uL   Monocytes Relative 12 %   Monocytes Absolute 0.8 0.1 - 1.0 K/uL   Eosinophils Relative 0 %   Eosinophils Absolute 0.0 0.0 - 0.5 K/uL   Basophils Relative 1 %   Basophils Absolute 0.1 0.0 - 0.1 K/uL   Immature Granulocytes 1 %   Abs Immature Granulocytes 0.04 0.00 - 0.07 K/uL    Comment: Performed at [facility], [address] [place], [place] [address]  Ethanol     Status: Abnormal   Collection Time: 07/07/22  4:55 AM  Result Value Ref Range   Alcohol, Ethyl (B) 175 (H) <10 mg/dL    Comment: (NOTE) Lowest detectable limit for serum alcohol is 10 mg/dL.  For medical purposes only. Performed at [facility], [address], [place], [place] [address]   Comprehensive metabolic panel     Status: Abnormal   Collection Time: 07/07/22  5:29 AM  Result Value Ref Range   Sodium 129 (L) 135 - 145  mmol/L   Potassium 3.0 (L) 3.5 - 5.1 mmol/L   Chloride 95 (L) 98 - 111 mmol/L   CO2 18 (L) 22 - 32 mmol/L   Glucose, Bld 97 70 - 99 mg/dL    Comment: Glucose reference range applies only to samples taken after fasting for at least 8 hours.   BUN 6 6 - 20 mg/dL   Creatinine, Ser 4.01 0.61 - 1.24 mg/dL   Calcium 7.8 (L) 8.9 - 10.3 mg/dL   Total Protein 8.1 6.5 - 8.1 g/dL   Albumin 3.2 (L) 3.5 - 5.0 g/dL   AST 57 (H) 15 - 41 U/L   ALT 38 0 - 44 U/L   Alkaline Phosphatase 51 38 - 126 U/L   Total Bilirubin 0.9 0.3 - 1.2 mg/dL   GFR, Estimated >02 >72 mL/min    Comment: (NOTE) Calculated using the CKD-EPI Creatinine Equation (2021)    Anion gap 16 (H) 5 - 15    Comment: Performed at [facility], [address] [place], [place] [address]  Rapid urine drug screen (hospital performed)     Status: Abnormal   Collection Time: 07/07/22  9:28 AM  Result Value Ref Range   Opiates NONE DETECTED NONE DETECTED   Cocaine NONE DETECTED NONE DETECTED   Benzodiazepines NONE DETECTED NONE DETECTED   Amphetamines NONE DETECTED NONE DETECTED   Tetrahydrocannabinol POSITIVE (A) NONE DETECTED   Barbiturates NONE DETECTED NONE DETECTED    Comment: (NOTE) DRUG SCREEN FOR MEDICAL PURPOSES ONLY.  IF CONFIRMATION IS NEEDED FOR ANY PURPOSE, NOTIFY LAB WITHIN 5 DAYS.  LOWEST DETECTABLE LIMITS FOR URINE DRUG SCREEN Drug Class                     Cutoff (ng/mL) Amphetamine and metabolites  1000 Barbiturate and metabolites    200 Benzodiazepine                 200 Opiates and metabolites        300 Cocaine and metabolites        300 THC                            50 Performed at [facility], [address], [place], [place] [address]     Current Facility-Administered Medications  Medication Dose Route Frequency Provider Last Rate Last Admin   LORazepam (ATIVAN) injection 0-4 mg  0-4 mg Intravenous Q6H Garlon Hatchet, PA-C   2 mg at 07/07/22 2956   Or    LORazepam (ATIVAN) tablet 0-4 mg  0-4 mg Oral Q6H Garlon Hatchet, PA-C   1 mg at 07/07/22 2130   [START ON 07/09/2022] LORazepam (ATIVAN) injection 0-4 mg  0-4 mg Intravenous Q12H Garlon Hatchet, PA-C       Or   Melene Muller ON 07/09/2022] LORazepam (ATIVAN) tablet 0-4 mg  0-4 mg Oral Q12H Sharilyn Sites M, PA-C       ondansetron [facility]) tablet 4 mg  4 mg Oral Q8H PRN Garlon Hatchet, PA-C   4 mg at 07/07/22 8657   thiamine (VITAMIN B1) tablet 100 mg  100 mg Oral Daily Garlon Hatchet, PA-C       Or   thiamine (VITAMIN B1) injection 100 mg  100 mg Intravenous Daily Sharilyn Sites M, PA-C   100 mg at 07/07/22 1016   Current Outpatient Medications  Medication Sig Dispense Refill   busPIRone (BUSPAR) 10 MG tablet Take 1.5 tablets (15 mg total) by mouth 2 (two) times daily. 60 tablet 1   gabapentin (NEURONTIN) 400 MG capsule Take 1 capsule (400 mg total) by mouth 3 (three) times daily. 90 capsule 0   risperiDONE (RISPERDAL) 3 MG tablet Take 1 tablet (3 mg total) by mouth 2 (two) times daily. 60 tablet 1   sertraline (ZOLOFT) 100 MG tablet Take 2 tablets (200 mg total) by mouth daily. 60 tablet 3   traZODone (DESYREL) 50 MG tablet Take 1-2 tablets (50-100 mg total) by mouth at bedtime. 60 tablet 3   valbenazine (INGREZZA) 40 MG capsule Take 1 capsule (40 mg total) by mouth daily. 30 capsule 2    Musculoskeletal:  Patient observed resting in bed.   Psychiatric Specialty Exam: Presentation  General Appearance: Disheveled  Eye Contact:Fleeting  Speech:Clear and Coherent  Speech Volume:Normal  Handedness:Right   Mood and Affect  Mood:Depressed; Hopeless  Affect:Depressed; Flat   Thought Process  Thought Processes:Coherent  Descriptions of Associations:Intact  Orientation:Full (Time, Place and Person)  Thought Content:Logical  History of Schizophrenia/Schizoaffective disorder:No data recorded Duration of Psychotic Symptoms:No data recorded Hallucinations:Hallucinations:  None  Ideas of Reference:None  Suicidal Thoughts:Suicidal Thoughts: Yes, Passive SI Passive Intent and/or Plan: Without Plan  Homicidal Thoughts:Homicidal Thoughts: No   Sensorium  Memory:Immediate Fair; Recent Fair  Judgment:Poor  Insight:Fair   Executive Functions  Concentration:Fair  Attention Span:Fair  Recall:Fair  Fund of Knowledge:Good  Language:Good   Psychomotor Activity  Psychomotor Activity:Psychomotor Activity: Normal   Assets  Assets:Communication Skills; Social Support    Sleep  Sleep:Sleep: Poor   Physical Exam: Physical Exam Vitals and nursing note reviewed. Exam conducted with a chaperone present.  Abdominal:     General: Abdomen is flat.  Musculoskeletal:     Cervical  back: Normal range of motion.  Neurological:     Mental Status: He is alert.  Psychiatric:        Attention and Perception: Attention normal.        Mood and Affect: Mood is depressed. Affect is flat.        Speech: Speech is slurred.        Behavior: Behavior is cooperative.        Thought Content: Thought content includes suicidal ideation.        Cognition and Memory: Memory normal. Cognition is impaired.        Judgment: Judgment is inappropriate.    Review of Systems  Constitutional: Negative.   HENT: Negative.    Respiratory: Negative.    Psychiatric/Behavioral:  Positive for depression, substance abuse and suicidal ideas.    Blood pressure 135/89, pulse (!) 110, temperature 99 F (37.2 C), temperature source Oral, resp. rate 18, height  (1.753 m), weight 81.6 kg, SpO2 97 %. Body mass index is 26.58 kg/m.   Medical Decision Making: Patient case reviewed and discussed with Dr. Lucianne Muss.  Patient needs inpatient psychiatric admission for stabilization and treatment.  Patient started on Ativan protocol and CIWA for BAL 175.  Will restart patient on his home medications.  Will consult with [facility]TS SW to fax out.   Disposition: Recommend psychiatric inpatient  admission.  Alona Bene, PMHNP 07/07/2022 11:37 AM

## 2022-07-08 DIAGNOSIS — F1910 Other psychoactive substance abuse, uncomplicated: Secondary | ICD-10-CM

## 2022-07-08 NOTE — Progress Notes (Signed)
[facility] Psych ED Progress Note  07/08/2022 8:48 PM Jerry [name]  MRN:  [id number]    Principal Problem: Polysubstance abuse Diagnosis:  Principal Problem:   Polysubstance abuse (HCC) Active Problems:   Schizophrenia   ED Assessment Time Calculation: Start Time: 1000 Stop Time: 1015 Total Time in Minutes (Assessment Completion): 15    Subjective:  On evaluation today, the patient is laying in his bed, watching TV in no acute distress. He is calm and cooperative during this assessment. His appearance is appropriate for environment. His eye contact is good.  Speech is clear and coherent, normal pace and normal volume. He reports her mood is euthymic.  Affect is congruent with mood.  Thought process is coherent.  Thought content is within normal limits.  He denies auditory and visual hallucinations.  No indication that he is responding to internal stimuli during this assessment. No delusions elicited during this assessment.  He continues to endorse suicidal thoughts, with no plan but attributes a lot of his suicidal thoughts to substance abuse. He denies homicidal ideations.  Patient asked if he was still being admitted to a facility, informed him that he will be admitted to a detox facility, patient said that "that is good.  "Patient stated that his appetite and sleep are fair, then again tells provider that he just wants help so that he can start a new life. Support, encouragement and reassurance provided about ongoing stressors and patient provided with opportunity for questions.     Past Psychiatric History:  Bipolar disorder, PTSD, depression, anxiety, schizophrenia spectrum disorder, substance-induced mood disorder (marijuana, cocaine, tobacco, methamphetamines,) and malingering   [place] Scale:  Flowsheet Row ED from 07/07/2022 in [facility] Emergency Department at [facility] ED from 01/11/2022 in [facility] ED from 07/16/2021 in [facility] Emergency  Department at [facility]  C-SSRS RISK CATEGORY No Risk No Risk No Risk       Past Medical History:  Past Medical History:  Diagnosis Date   Anxiety    Bipolar 1 disorder    Depression    Hallucination    Paranoid schizophrenia    since pt's 20's   PTSD (post-traumatic stress disorder)     Past Surgical History:  Procedure Laterality Date   KIDNEY DONATION Right 2003   Family History:  Family History  Problem Relation Age of Onset   Heart disease Mother    Cancer Mother    Heart disease Father    Diabetes Father    Diabetes Maternal Grandmother    Stroke Paternal Grandfather      Social History:  Social History   Substance and Sexual Activity  Alcohol Use Yes   Alcohol/week: 32.0 standard drinks of alcohol   Types: 12 Cans of beer, 20 Shots of liquor per week   Comment: last use 10/28/20- drinks 1/2 gallon vodka daily     Social History   Substance and Sexual Activity  Drug Use Not Currently   Types: Marijuana, Cocaine, Heroin, Amphetamines, Methamphetamines   Comment: history of IVDA; last use 10/28/20    Social History   Socioeconomic History   Marital status: Married    Spouse name: Not on file   Number of children: Not on file   Years of education: Not on file   Highest education level: Not on file  Occupational History   Not on file  Tobacco Use   Smoking status: Every Day    Packs/day: 2  Types: Cigarettes   Smokeless tobacco: Never  Vaping Use   Vaping Use: Never used  Substance and Sexual Activity   Alcohol use: Yes    Alcohol/week: 32.0 standard drinks of alcohol    Types: 12 Cans of beer, 20 Shots of liquor per week    Comment: last use 10/28/20- drinks 1/2 gallon vodka daily   Drug use: Not Currently    Types: Marijuana, Cocaine, Heroin, Amphetamines, Methamphetamines    Comment: history of IVDA; last use 10/28/20   Sexual activity: Yes  Other Topics Concern   Not on file  Social History Narrative   Pt has been homeless  for the past year and living outdoors for the past week; previously he has been living in hotels. Separated from wife. No family support.   Social Determinants of Health   Financial Resource Strain: Not on file  Food Insecurity: Not on file  Transportation Needs: Not on file  Physical Activity: Not on file  Stress: Not on file  Social Connections: Not on file    Sleep: Good  Appetite:  Good  Current Medications: Current Facility-Administered Medications  Medication Dose Route Frequency Provider Last Rate Last Admin   busPIRone (BUSPAR) tablet 15 mg  15 mg Oral BID Motley-Mangrum, Renne Platts A, PMHNP   15 mg at 07/08/22 0940   gabapentin (NEURONTIN) capsule 400 mg  400 mg Oral TID Motley-Mangrum, Kyri Shader A, PMHNP   400 mg at 07/08/22 1610   LORazepam (ATIVAN) injection 0-4 mg  0-4 mg Intravenous Q6H Garlon Hatchet, PA-C   2 mg at 07/07/22 1610   Or   LORazepam (ATIVAN) tablet 0-4 mg  0-4 mg Oral Q6H Garlon Hatchet, PA-C   2 mg at 07/07/22 2133   [START ON 07/09/2022] LORazepam (ATIVAN) injection 0-4 mg  0-4 mg Intravenous Q12H Garlon Hatchet, PA-C       Or   Melene Muller ON 07/09/2022] LORazepam (ATIVAN) tablet 0-4 mg  0-4 mg Oral Q12H Sharilyn Sites M, PA-C       ondansetron [facility]) tablet 4 mg  4 mg Oral Q8H PRN Garlon Hatchet, PA-C   4 mg at 07/07/22 9604   risperiDONE (RISPERDAL) tablet 3 mg  3 mg Oral BID Motley-Mangrum, Soleil Mas A, PMHNP   3 mg at 07/08/22 0939   sertraline (ZOLOFT) tablet 200 mg  200 mg Oral Daily Motley-Mangrum, Nylene Inlow A, PMHNP   200 mg at 07/08/22 5409   thiamine (VITAMIN B1) tablet 100 mg  100 mg Oral Daily Sharilyn Sites M, PA-C   100 mg at 07/08/22 0940   Or   thiamine (VITAMIN B1) injection 100 mg  100 mg Intravenous Daily Sharilyn Sites M, PA-C   100 mg at 07/07/22 1016   traZODone (DESYREL) tablet 50 mg  50 mg Oral QHS,MR X 1 Motley-Mangrum, Tashan Kreitzer A, PMHNP   50 mg at 07/07/22 2136   Current Outpatient Medications  Medication Sig Dispense Refill   busPIRone  (BUSPAR) 10 MG tablet Take 1.5 tablets (15 mg total) by mouth 2 (two) times daily. (Patient taking differently: Take 10 mg by mouth 2 (two) times daily.) 60 tablet 1   gabapentin (NEURONTIN) 400 MG capsule Take 1 capsule (400 mg total) by mouth 3 (three) times daily. (Patient taking differently: Take 400 mg by mouth 2 (two) times daily.) 90 capsule 0   risperiDONE (RISPERDAL) 3 MG tablet Take 1 tablet (3 mg total) by mouth 2 (two) times daily. 60 tablet 1   sertraline (ZOLOFT) 100 MG  tablet Take 2 tablets (200 mg total) by mouth daily. 60 tablet 3   traZODone (DESYREL) 50 MG tablet Take 1-2 tablets (50-100 mg total) by mouth at bedtime. (Patient taking differently: Take 50 mg by mouth at bedtime as needed for sleep.) 60 tablet 3   valbenazine (INGREZZA) 40 MG capsule Take 1 capsule (40 mg total) by mouth daily. (Patient not taking: Reported on 07/07/2022) 30 capsule 2    Lab Results:  Results for orders placed or performed during the hospital encounter of 07/07/22 (from the past 48 hour(s))  CBC with Differential     Status: None   Collection Time: 07/07/22  4:43 AM  Result Value Ref Range   WBC 6.8 4.0 - 10.5 K/uL   RBC 5.12 4.22 - 5.81 MIL/uL   Hemoglobin 16.5 13.0 - 17.0 g/dL   HCT 16.1 09.6 - 04.5 %   MCV 93.0 80.0 - 100.0 fL   MCH 32.2 26.0 - 34.0 pg   MCHC 34.7 30.0 - 36.0 g/dL   RDW 40.9 81.1 - 91.4 %   Platelets 276 150 - 400 K/uL   nRBC 0.0 0.0 - 0.2 %   Neutrophils Relative % 59 %   Neutro Abs 4.0 1.7 - 7.7 K/uL   Lymphocytes Relative 27 %   Lymphs Abs 1.8 0.7 - 4.0 K/uL   Monocytes Relative 12 %   Monocytes Absolute 0.8 0.1 - 1.0 K/uL   Eosinophils Relative 0 %   Eosinophils Absolute 0.0 0.0 - 0.5 K/uL   Basophils Relative 1 %   Basophils Absolute 0.1 0.0 - 0.1 K/uL   Immature Granulocytes 1 %   Abs Immature Granulocytes 0.04 0.00 - 0.07 K/uL    Comment: Performed at [facility], [address] [place], [place] [address]  Ethanol     Status: Abnormal    Collection Time: 07/07/22  4:55 AM  Result Value Ref Range   Alcohol, Ethyl (B) 175 (H) <10 mg/dL    Comment: (NOTE) Lowest detectable limit for serum alcohol is 10 mg/dL.  For medical purposes only. Performed at [facility], [address] [place], [place] [address]   Comprehensive metabolic panel     Status: Abnormal   Collection Time: 07/07/22  5:29 AM  Result Value Ref Range   Sodium 129 (L) 135 - 145 mmol/L   Potassium 3.0 (L) 3.5 - 5.1 mmol/L   Chloride 95 (L) 98 - 111 mmol/L   CO2 18 (L) 22 - 32 mmol/L   Glucose, Bld 97 70 - 99 mg/dL    Comment: Glucose reference range applies only to samples taken after fasting for at least 8 hours.   BUN 6 6 - 20 mg/dL   Creatinine, Ser 8.65 0.61 - 1.24 mg/dL   Calcium 7.8 (L) 8.9 - 10.3 mg/dL   Total Protein 8.1 6.5 - 8.1 g/dL   Albumin 3.2 (L) 3.5 - 5.0 g/dL   AST 57 (H) 15 - 41 U/L   ALT 38 0 - 44 U/L   Alkaline Phosphatase 51 38 - 126 U/L   Total Bilirubin 0.9 0.3 - 1.2 mg/dL   GFR, Estimated >78 >46 mL/min    Comment: (NOTE) Calculated using the CKD-EPI Creatinine Equation (2021)    Anion gap 16 (H) 5 - 15    Comment: Performed at [facility], [address] [place], [place] [address]  Rapid urine drug screen (hospital performed)     Status: Abnormal   Collection Time:  07/07/22  9:28 AM  Result Value Ref Range   Opiates NONE DETECTED NONE DETECTED   Cocaine NONE DETECTED NONE DETECTED   Benzodiazepines NONE DETECTED NONE DETECTED   Amphetamines NONE DETECTED NONE DETECTED   Tetrahydrocannabinol POSITIVE (A) NONE DETECTED   Barbiturates NONE DETECTED NONE DETECTED    Comment: (NOTE) DRUG SCREEN FOR MEDICAL PURPOSES ONLY.  IF CONFIRMATION IS NEEDED FOR ANY PURPOSE, NOTIFY LAB WITHIN 5 DAYS.  LOWEST DETECTABLE LIMITS FOR URINE DRUG SCREEN Drug Class                     Cutoff (ng/mL) Amphetamine and metabolites    1000 Barbiturate and metabolites    200 Benzodiazepine                  200 Opiates and metabolites        300 Cocaine and metabolites        300 THC                            50 Performed at [facility], [address], [place], [place] [address]   SARS Coronavirus 2 by RT PCR (hospital order, performed in [facility] hospital lab) *cepheid single result test* Anterior Nasal Swab     Status: None   Collection Time: 07/07/22  7:36 PM   Specimen: Anterior Nasal Swab  Result Value Ref Range   SARS Coronavirus 2 by RT PCR NEGATIVE NEGATIVE    Comment: (NOTE) SARS-CoV-2 target nucleic acids are NOT DETECTED.  The SARS-CoV-2 RNA is generally detectable in upper and lower respiratory specimens during the acute phase of infection. The lowest concentration of SARS-CoV-2 viral copies this assay can detect is 250 copies / mL. A negative result does not preclude SARS-CoV-2 infection and should not be used as the sole basis for treatment or other patient management decisions.  A negative result may occur with improper specimen collection / handling, submission of specimen other than nasopharyngeal swab, presence of viral mutation(s) within the areas targeted by this assay, and inadequate number of viral copies (<250 copies / mL). A negative result must be combined with clinical observations, patient history, and epidemiological information.  Fact Sheet for Patients:   RoadLapTop.co.za  Fact Sheet for Healthcare Providers: http://kim-miller.com/  This test is not yet approved or  cleared by the [place] FDA and has been authorized for detection and/or diagnosis of SARS-CoV-2 by FDA under an Emergency Use Authorization (EUA).  This EUA will remain in effect (meaning this test can be used) for the duration of the COVID-19 declaration under Section 564(b)(1) of the Act, 21 U.S.C. section 360bbb-3(b)(1), unless the authorization is terminated or revoked sooner.  Performed at [facility], [address] [place], [place] [address]     Blood Alcohol level:  Lab Results  Component Value Date   ETH 175 (H) 07/07/2022   ETH 270 (H) 07/16/2021    Physical Findings:  CIWA:  CIWA-Ar Total: 0 COWS:     Musculoskeletal: Strength & Muscle Tone: within normal limits Gait & Station: normal Patient leans: N/A  Psychiatric Specialty Exam:  Presentation  General Appearance:  Appropriate for Environment  Eye Contact: Good  Speech: Clear and Coherent  Speech Volume: Normal  Handedness: Right   Mood and Affect  Mood: Depressed; Anxious  Affect: Flat   Thought Process  Thought Processes: Coherent  Descriptions of Associations:Intact  Orientation:Full (  Time, Place and Person)  Thought Content:Logical  History of Schizophrenia/Schizoaffective disorder:No data recorded Duration of Psychotic Symptoms:No data recorded Hallucinations:Hallucinations: None  Ideas of Reference:None  Suicidal Thoughts:Suicidal Thoughts: Yes, Passive SI Passive Intent and/or Plan: Without Plan  Homicidal Thoughts:Homicidal Thoughts: No   Sensorium  Memory: Immediate Fair; Recent Fair  Judgment: Fair  Insight: Fair   Art therapist  Concentration: Fair  Attention Span: Fair  Recall: Good  Fund of Knowledge: Good  Language: Good   Psychomotor Activity  Psychomotor Activity: Psychomotor Activity: Normal   Assets  Assets: Communication Skills; Desire for Improvement; Social Support   Sleep  Sleep: Sleep: Good    Physical Exam: Physical Exam Vitals and nursing note reviewed. Exam conducted with a chaperone present.  Pulmonary:     Effort: Pulmonary effort is normal.  Musculoskeletal:     Cervical back: Normal range of motion.  Neurological:     Mental Status: He is alert.  Psychiatric:        Attention and Perception: Attention normal.        Mood and Affect: Mood is depressed. Affect is flat.         Speech: Speech normal.        Behavior: Behavior is cooperative.        Thought Content: Thought content includes suicidal ideation.        Cognition and Memory: Memory normal.        Judgment: Judgment is inappropriate.    Review of Systems  Constitutional: Negative.   Respiratory: Negative.    Musculoskeletal: Negative.   Psychiatric/Behavioral:  Positive for depression and substance abuse.    Blood pressure 130/83, pulse 95, temperature 97.8 F (36.6 C), temperature source Oral, resp. rate 15, height  (1.753 m), weight 81.6 kg, SpO2 99 %. Body mass index is 26.58 kg/m.   Medical Decision Making: Recommended [facility] for detox treatment.  Possible admission to [facility] tomorrow 07-09-2022.   Shwanda Soltis MOTLEY-MANGRUM, PMHNP 07/08/2022, 8:48 PM

## 2022-07-08 NOTE — ED Provider Notes (Signed)
Emergency Medicine Observation Re-evaluation Note  Jerry [name] is a 47 y.o. male, seen on rounds today.  Pt initially presented to the ED for complaints of Alcohol Problem Currently, the patient is resting.  Physical Exam  BP 136/84   Pulse 99   Temp 99.6 F (37.6 C) (Oral)   Resp 17   Ht  (1.753 m)   Wt 81.6 kg   SpO2 97%   BMI 26.58 kg/m  Physical Exam General: nad Lungs: breathing comfortably Psych: calm  ED Course / MDM  EKG:EKG Interpretation  Date/Time:  Friday July 07 2022 04:44:25 EDT Ventricular Rate:  101 PR Interval:  120 QRS Duration: 102 QT Interval:  380 QTC Calculation: 493 R Axis:   84 Text Interpretation: Sinus tachycardia Confirmed by Nicanor Alcon, April ([id number]) on 07/07/2022 4:46:38 AM  I have reviewed the labs performed to date as well as medications administered while in observation.  Recent changes in the last 24 hours include TTS did eval, inpatient tx recommended, CIWA.  Plan  Current plan is for placement .    Sloan Leiter, DO 07/08/22 [phone]

## 2022-07-08 NOTE — ED Notes (Signed)
Patient requesting food at this time. Sitter provided patient with ham sandwich.

## 2022-07-08 NOTE — ED Notes (Signed)
Patient alert and oriented, patient denies any pain. Denies nausea/vomiting, denies hallucinations or sensations. Patient states he feels fine but feels dizzy when he gets up.

## 2022-07-09 ENCOUNTER — Other Ambulatory Visit (HOSPITAL_COMMUNITY)
Admission: EM | Admit: 2022-07-09 | Discharge: 2022-07-11 | Disposition: A | Discharge status: Home / Self Care | Payer: No Payment, Other | Attending: Psychiatry | Admitting: Psychiatry

## 2022-07-09 DIAGNOSIS — Z9151 Personal history of suicidal behavior: Secondary | ICD-10-CM | POA: Insufficient documentation

## 2022-07-09 DIAGNOSIS — F4310 Post-traumatic stress disorder, unspecified: Secondary | ICD-10-CM | POA: Diagnosis not present

## 2022-07-09 DIAGNOSIS — F1210 Cannabis abuse, uncomplicated: Secondary | ICD-10-CM | POA: Diagnosis not present

## 2022-07-09 DIAGNOSIS — T43651A Poisoning by methamphetamines accidental (unintentional), initial encounter: Secondary | ICD-10-CM | POA: Insufficient documentation

## 2022-07-09 DIAGNOSIS — F209 Schizophrenia, unspecified: Secondary | ICD-10-CM | POA: Insufficient documentation

## 2022-07-09 DIAGNOSIS — G2401 Drug induced subacute dyskinesia: Secondary | ICD-10-CM

## 2022-07-09 DIAGNOSIS — Z1152 Encounter for screening for COVID-19: Secondary | ICD-10-CM | POA: Insufficient documentation

## 2022-07-09 DIAGNOSIS — F250 Schizoaffective disorder, bipolar type: Secondary | ICD-10-CM | POA: Diagnosis present

## 2022-07-09 DIAGNOSIS — F1010 Alcohol abuse, uncomplicated: Secondary | ICD-10-CM

## 2022-07-09 DIAGNOSIS — F1410 Cocaine abuse, uncomplicated: Secondary | ICD-10-CM | POA: Diagnosis not present

## 2022-07-09 DIAGNOSIS — F411 Generalized anxiety disorder: Secondary | ICD-10-CM

## 2022-07-09 LAB — LIPID PANEL
Cholesterol: 162 mg/dL (ref 0–200)
HDL: 67 mg/dL (ref >40)
LDL Cholesterol: 78 mg/dL (ref 0–99)
Total CHOL/HDL Ratio: 2.4 RATIO
Triglycerides: 86 mg/dL (ref <150)
VLDL: 17 mg/dL (ref 0–40)

## 2022-07-09 LAB — HEMOGLOBIN A1C
Hgb A1c MFr Bld: 5.3 % (ref 4.8–5.6)
Mean Plasma Glucose: 105.41 mg/dL

## 2022-07-09 LAB — SARS CORONAVIRUS 2 BY RT PCR: SARS Coronavirus 2 by RT PCR: NEGATIVE

## 2022-07-09 LAB — TSH: TSH: 2.179 u[IU]/mL (ref 0.350–4.500)

## 2022-07-09 LAB — CBC WITH DIFFERENTIAL/PLATELET
Abs Immature Granulocytes: 0.05 10*3/uL (ref 0.00–0.07)
Basophils Absolute: 0 10*3/uL (ref 0.0–0.1)
Basophils Relative: 0 %
Eosinophils Absolute: 0.1 10*3/uL (ref 0.0–0.5)
Eosinophils Relative: 1 %
HCT: 38.1 % — ABNORMAL LOW (ref 39.0–52.0)
Hemoglobin: 13.3 g/dL (ref 13.0–17.0)
Immature Granulocytes: 1 %
Lymphocytes Relative: 20 %
Lymphs Abs: 1.7 10*3/uL (ref 0.7–4.0)
MCH: 32.8 pg (ref 26.0–34.0)
MCHC: 34.9 g/dL (ref 30.0–36.0)
MCV: 94.1 fL (ref 80.0–100.0)
Monocytes Absolute: 0.8 10*3/uL (ref 0.1–1.0)
Monocytes Relative: 9 %
Neutro Abs: 5.8 10*3/uL (ref 1.7–7.7)
Neutrophils Relative %: 69 %
Platelets: 184 10*3/uL (ref 150–400)
RBC: 4.05 MIL/uL — ABNORMAL LOW (ref 4.22–5.81)
RDW: 12.8 % (ref 11.5–15.5)
WBC: 8.4 10*3/uL (ref 4.0–10.5)
nRBC: 0 % (ref 0.0–0.2)

## 2022-07-09 LAB — COMPREHENSIVE METABOLIC PANEL
ALT: 29 U/L (ref 0–44)
AST: 42 U/L — ABNORMAL HIGH (ref 15–41)
Albumin: 2.9 g/dL — ABNORMAL LOW (ref 3.5–5.0)
Alkaline Phosphatase: 50 U/L (ref 38–126)
Anion gap: 13 (ref 5–15)
BUN: 10 mg/dL (ref 6–20)
CO2: 26 mmol/L (ref 22–32)
Calcium: 9.2 mg/dL (ref 8.9–10.3)
Chloride: 94 mmol/L — ABNORMAL LOW (ref 98–111)
Creatinine, Ser: 1.16 mg/dL (ref 0.61–1.24)
GFR, Estimated: >60 mL/min (ref >60)
Glucose, Bld: 97 mg/dL (ref 70–99)
Potassium: 3.9 mmol/L (ref 3.5–5.1)
Sodium: 133 mmol/L — ABNORMAL LOW (ref 135–145)
Total Bilirubin: 0.6 mg/dL (ref 0.3–1.2)
Total Protein: 7.4 g/dL (ref 6.5–8.1)

## 2022-07-09 MED ORDER — MAGNESIUM HYDROXIDE 400 MG/5ML PO SUSP: 30.0000 mL | Freq: Every day | ORAL | Status: DC | PRN

## 2022-07-09 MED ORDER — NICOTINE POLACRILEX 2 MG MT GUM
2.0000 mg | CHEWING_GUM | OROMUCOSAL | Status: DC | PRN
  Administered 2022-07-09: 4 mg via ORAL
  Filled 2022-07-09: qty 2

## 2022-07-09 MED ORDER — THIAMINE MONONITRATE 100 MG PO TABS
100.0000 mg | ORAL_TABLET | Freq: Every day | ORAL | Status: DC
  Administered 2022-07-10 – 2022-07-11 (×2): 100 mg via ORAL
  Filled 2022-07-09 (×2): qty 1

## 2022-07-09 MED ORDER — LORAZEPAM 1 MG PO TABS: 1.0000 mg | ORAL_TABLET | Freq: Four times a day (QID) | ORAL | Status: DC | PRN

## 2022-07-09 MED ORDER — ACETAMINOPHEN 325 MG PO TABS: 650.0000 mg | ORAL_TABLET | Freq: Four times a day (QID) | ORAL | Status: DC | PRN

## 2022-07-09 MED ORDER — ONDANSETRON 4 MG PO TBDP: 4.0000 mg | ORAL_TABLET | Freq: Four times a day (QID) | ORAL | Status: DC | PRN

## 2022-07-09 MED ORDER — LOPERAMIDE HCL 2 MG PO CAPS
2.0000 mg | ORAL_CAPSULE | ORAL | Status: DC | PRN
  Administered 2022-07-09: 2 mg via ORAL
  Filled 2022-07-09: qty 1

## 2022-07-09 MED ORDER — ADULT MULTIVITAMIN W/MINERALS CH
1.0000 | ORAL_TABLET | Freq: Every day | ORAL | Status: DC
  Administered 2022-07-09 – 2022-07-11 (×3): 1 via ORAL
  Filled 2022-07-09 (×3): qty 1

## 2022-07-09 MED ORDER — LORAZEPAM 1 MG PO TABS: 1.0000 mg | ORAL_TABLET | Freq: Two times a day (BID) | ORAL | Status: DC

## 2022-07-09 MED ORDER — LORAZEPAM 1 MG PO TABS
1.0000 mg | ORAL_TABLET | Freq: Three times a day (TID) | ORAL | Status: AC
  Administered 2022-07-10 – 2022-07-11 (×3): 1 mg via ORAL
  Filled 2022-07-09 (×3): qty 1

## 2022-07-09 MED ORDER — LORAZEPAM 1 MG PO TABS: 1.0000 mg | ORAL_TABLET | Freq: Every day | ORAL | Status: DC

## 2022-07-09 MED ORDER — ALUM & MAG HYDROXIDE-SIMETH 200-200-20 MG/5ML PO SUSP: 30.0000 mL | ORAL | Status: DC | PRN

## 2022-07-09 MED ORDER — TRAZODONE HCL 50 MG PO TABS
50.0000 mg | ORAL_TABLET | Freq: Every evening | ORAL | Status: DC | PRN
  Administered 2022-07-09: 50 mg via ORAL
  Filled 2022-07-09: qty 1

## 2022-07-09 MED ORDER — RISPERIDONE 0.5 MG PO TABS
0.5000 mg | ORAL_TABLET | Freq: Two times a day (BID) | ORAL | Status: DC
  Administered 2022-07-09 – 2022-07-11 (×5): 0.5 mg via ORAL
  Filled 2022-07-09 (×5): qty 1

## 2022-07-09 MED ORDER — HYDROXYZINE HCL 25 MG PO TABS: 25.0000 mg | ORAL_TABLET | Freq: Three times a day (TID) | ORAL | Status: DC | PRN

## 2022-07-09 MED ORDER — NICOTINE 21 MG/24HR TD PT24
21.0000 mg | MEDICATED_PATCH | Freq: Every day | TRANSDERMAL | Status: DC
  Administered 2022-07-10 – 2022-07-11 (×2): 21 mg via TRANSDERMAL
  Filled 2022-07-09 (×2): qty 1

## 2022-07-09 MED ORDER — SERTRALINE HCL 25 MG PO TABS
25.0000 mg | ORAL_TABLET | Freq: Once | ORAL | Status: AC
  Administered 2022-07-09: 25 mg via ORAL
  Filled 2022-07-09: qty 1

## 2022-07-09 MED ORDER — SERTRALINE HCL 50 MG PO TABS
50.0000 mg | ORAL_TABLET | Freq: Every day | ORAL | Status: DC
  Administered 2022-07-10 – 2022-07-11 (×2): 50 mg via ORAL
  Filled 2022-07-09 (×2): qty 1

## 2022-07-09 MED ORDER — LORAZEPAM 1 MG PO TABS
1.0000 mg | ORAL_TABLET | Freq: Four times a day (QID) | ORAL | Status: AC
  Administered 2022-07-09 – 2022-07-10 (×4): 1 mg via ORAL
  Filled 2022-07-09 (×4): qty 1

## 2022-07-09 MED ORDER — VALBENAZINE TOSYLATE 40 MG PO CAPS
40.0000 mg | ORAL_CAPSULE | Freq: Every day | ORAL | Status: DC
  Administered 2022-07-09 – 2022-07-11 (×3): 40 mg via ORAL
  Filled 2022-07-09 (×3): qty 1

## 2022-07-09 MED ORDER — HYDROXYZINE HCL 25 MG PO TABS
25.0000 mg | ORAL_TABLET | Freq: Four times a day (QID) | ORAL | Status: DC | PRN
  Administered 2022-07-09: 25 mg via ORAL
  Filled 2022-07-09: qty 1

## 2022-07-09 NOTE — ED Notes (Signed)
Denies sxs of distress at this time - will continue to monitor for safety

## 2022-07-09 NOTE — ED Notes (Signed)
Safe transport arrived to take patient to [facility]

## 2022-07-09 NOTE — ED Notes (Signed)
Pt sleeping in no acute distress. RR even and unlabored. Environment secured. Will continue to monitor for safety. 

## 2022-07-09 NOTE — ED Provider Notes (Addendum)
  Physical Exam  BP 120/81 (BP Location: Left Arm)   Pulse 93   Temp 99.5 F (37.5 C) (Oral)   Resp 18   Ht  (1.753 m)   Wt 81.6 kg   SpO2 95%   BMI 26.58 kg/m   Physical Exam  Procedures  Procedures  ED Course / MDM   Clinical Course as of 07/09/22 0832  Sat Jul 08, 2022  0819 Tetrahydrocannabinol(!): POSITIVE [SG]  1610 Potassium(!): 3.0 Replaced  [SG]    Clinical Course User Index [SG] Sloan Leiter, DO   Medical Decision Making Amount and/or Complexity of Data Reviewed Labs: ordered. ECG/medicine tests: ordered.  Risk OTC drugs. Prescription drug management.   Pending inpatient treatment.  Reported transfer to [facility] today.  Will be transferring to [facility] now.       Benjiman Core, MD 07/09/22 9604    Benjiman Core, MD 07/09/22 1123

## 2022-07-09 NOTE — ED Notes (Signed)
Patient was provided with dinner 

## 2022-07-09 NOTE — ED Notes (Addendum)
RN called [facility] and gave report to [place], Charity fundraiser

## 2022-07-09 NOTE — ED Notes (Signed)
Safe Transport called to have patient transported
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
## 2022-07-09 NOTE — ED Notes (Signed)
Patient was provided with lunch 

## 2022-07-09 NOTE — ED Notes (Signed)
Patient resting in no sxs of distress noted - will continue to monitor for safety

## 2022-07-09 NOTE — Group Note (Signed)
Group Topic: Change and Accountability  Group Date: 07/09/2022 Start Time: 1445 End Time: 1530 Facilitators: Maeola Sarah  Department: [facility]  Number of Participants: 4  Group Focus: other protective factors  Treatment Modality:  Psychoeducation Interventions utilized were clarification and patient education Purpose: enhance coping skills, increase insight, regain self-worth, and reinforce self-care  Name: Jerry [name] Date of Birth: 09/01/1975  MR: [id number]    Level of Participation: active Quality of Participation: attentive, cooperative, engaged, and offered feedback Interactions with others: gave feedback Mood/Affect: appropriate and positive Triggers (if applicable): N/A Cognition: coherent/clear Progress: Gaining insight Response: Patient shared that he needs to think about his sense of purpose when it comes to protective factors because he's unsure at the moment. Plan: patient will be encouraged to attend all groups present and future  Patients Problems:  Patient Active Problem List   Diagnosis Date Noted   Schizoaffective disorder, bipolar type 07/09/2022   Schizophrenia 07/07/2022   Auditory hallucination    Malingering 09/07/2020   Homelessness 09/07/2020   Substance induced mood disorder 09/07/2020   Major depressive disorder, recurrent severe without psychotic features 07/17/2020   Schizophrenia spectrum disorder with psychotic disorder type not yet determined 07/17/2020   Methamphetamine abuse 07/17/2020   Marijuana abuse 07/17/2020   Cocaine abuse 07/17/2020   Anxiety and depression 06/25/2014   Family history of diabetes mellitus (DM) 06/25/2014   Family history of thyroid disease 06/25/2014   Tobacco abuse 06/25/2014   Alcohol abuse 05/18/2013   Polysubstance abuse (HCC) 05/18/2013   Depression, unspecified 05/18/2013

## 2022-07-09 NOTE — ED Notes (Signed)
Patient is resting in his room. Patient has been pleasant to staff and cooperative since being admitted to the unit. Patient denies SI/HI and AVH. Patient is being monitored for safety.
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
## 2022-07-09 NOTE — Progress Notes (Signed)
Pt was accepted to [facility] ([facility]) TODAY 07/09/2022 -Address: [address] [place], [place] [address]   Pt meets inpatient criteria per Alona Bene, PMHNP   Attending Physician will be Rhea Belton, MD   Report can be called to: [phone]   Pt can arrive after: Beds ready   Arliss Journey, MSW, LCSWA

## 2022-07-09 NOTE — ED Notes (Signed)
Snacks given 

## 2022-07-10 DIAGNOSIS — F1010 Alcohol abuse, uncomplicated: Secondary | ICD-10-CM | POA: Diagnosis not present

## 2022-07-10 NOTE — ED Notes (Signed)
Pt sleeping in no acute distress. RR even and unlabored. Environment secured. Will continue to monitor for safety. 

## 2022-07-10 NOTE — ED Notes (Signed)
Pt sleeping@this time. No c/o pain or distress. Will continue to monitor for safety 

## 2022-07-10 NOTE — Group Note (Signed)
Group Topic: Communication  Group Date: 07/10/2022 Start Time: 0900 End Time: 0930 Facilitators: Prentice Docker, RN  Department: [facility]  Number of Participants: 4  Group Focus: communication Treatment Modality:  Psychoeducation Interventions utilized were other medication education Purpose: increase insight  Name: DESSIE DELCARLO Date of Birth: 06/14/1975  MR: [id number]    Level of Participation: active Quality of Participation: cooperative Interactions with others: n/aattentive Mood/Affect: appropriate and positive Triggers (if applicable): n/a Cognition: coherent/clear and insightful Progress: Gaining insight Response: compliant Plan: patient will be encouraged to continue medication compliance and report any concerns about medications  Patients Problems:  Patient Active Problem List   Diagnosis Date Noted   Schizoaffective disorder, bipolar type 07/09/2022   Schizophrenia 07/07/2022   Auditory hallucination    Malingering 09/07/2020   Homelessness 09/07/2020   Substance induced mood disorder 09/07/2020   Major depressive disorder, recurrent severe without psychotic features 07/17/2020   Schizophrenia spectrum disorder with psychotic disorder type not yet determined 07/17/2020   Methamphetamine abuse 07/17/2020   Marijuana abuse 07/17/2020   Cocaine abuse 07/17/2020   Anxiety and depression 06/25/2014   Family history of diabetes mellitus (DM) 06/25/2014   Family history of thyroid disease 06/25/2014   Tobacco abuse 06/25/2014   Alcohol abuse 05/18/2013   Polysubstance abuse (HCC) 05/18/2013   Depression, unspecified 05/18/2013

## 2022-07-10 NOTE — ED Notes (Signed)
Patient resting quietly in bed with eyes closed, Respirations equal and unlabored, skin warm and dry, NAD. Routine safety checks conducted according to facility protocol. Will continue to monitor for safety. 

## 2022-07-10 NOTE — Group Note (Signed)
Group Topic: Change and Accountability  Group Date: 07/10/2022 Start Time: 1030 End Time: 1145 Facilitators: Vonzell Schlatter B  Department: [id number] [facility]  Number of Participants: 5  Group Focus: clarity of thought Treatment Modality:  Psychoeducation Interventions utilized were patient education Purpose: increase insight  Name: Jerry [name] Date of Birth: 12/05/1975  MR: [id number]    Level of Participation: NA Quality of Participation: NA Interactions with others: NA Mood/Affect: NA Triggers (if applicable): NA Cognition: NA Progress: None Response: NA didn't attend group Plan: follow-up needed  Patients Problems:  Patient Active Problem List   Diagnosis Date Noted   Schizoaffective disorder, bipolar type 07/09/2022   Schizophrenia 07/07/2022   Auditory hallucination    Malingering 09/07/2020   Homelessness 09/07/2020   Substance induced mood disorder 09/07/2020   Major depressive disorder, recurrent severe without psychotic features 07/17/2020   Schizophrenia spectrum disorder with psychotic disorder type not yet determined 07/17/2020   Methamphetamine abuse 07/17/2020   Marijuana abuse 07/17/2020   Cocaine abuse 07/17/2020   Anxiety and depression 06/25/2014   Family history of diabetes mellitus (DM) 06/25/2014   Family history of thyroid disease 06/25/2014   Tobacco abuse 06/25/2014   Alcohol abuse 05/18/2013   Polysubstance abuse (HCC) 05/18/2013   Depression, unspecified 05/18/2013

## 2022-07-10 NOTE — ED Provider Notes (Cosign Needed Addendum)
Behavioral Health Progress Note  Date and Time: 07/10/2022 10:52 AM Name: Jerry [name] MRN:  [id number]  Subjective:   Jerry [name] is a 47 yr old male who presented on 4/19 to [facility] requesting detox from EtOH and to restart his medications.  PPHx is significant for Schizophrenia, GAD, PTSD, and Polysubstance Abuse (EtOH, Meth, Cocaine, and THC), and 2 Suicide Attempts (last 10/2020 OD), multiple Psychiatric Hospitalizations (last [facility] 10/2020), and has been to Rehab multiple times, and no history of Self Injurious Behavior.  He does have a history of Withdrawal Seizures.   Patient was seen this morning.  He reports prior to coming to [facility], he was going through detox for 10 days at [facility].  He reports feeling better in terms of his mood.  He denies SI, HI, AVH, delusions.  He reports his most recent drink was 2 Fridays ago.  He has been drinking alcohol since 47 years old.  He reports being sober recently for 1.5 years.  He reports his medications have been helpful regarding his AVH prior to admission.  He plans on returning home with his wife and she can pick him up tomorrow.  Diagnosis:  Final diagnoses:  ETOH abuse  Schizophrenia, unspecified type    Total Time spent with patient: 30 minutes  Past Psychiatric History: Schizophrenia (diagnosed 4 years ago but unclear if his symptoms are always in the context of substance use), GAD, PTSD, and Polysubstance Abuse (EtOH, Meth, Cocaine, and THC), and 2 Suicide Attempts (last 10/2020 OD), multiple Psychiatric Hospitalizations (last [facility] 10/2020), and has been to Rehab multiple times, and no history of Self Injurious Behavior.  He does have a history of Withdrawal Seizures.  Past Medical History: Withdrawal Seizures  Family History: Mother- substance use, Son- Meth Abuse,two sons- Self Injurious Behavior- cutting   Social History: Unemployed, lives with wife   Sleep: Fair  Appetite:  Fair  Current Medications:  Current  Facility-Administered Medications  Medication Dose Route Frequency Provider Last Rate Last Admin   acetaminophen (TYLENOL) tablet 650 mg  650 mg Oral Q6H PRN Lauro Franklin, MD       alum & mag hydroxide-simeth (MAALOX/MYLANTA) 200-200-20 MG/5ML suspension 30 mL  30 mL Oral Q4H PRN Lauro Franklin, MD       hydrOXYzine (ATARAX) tablet 25 mg  25 mg Oral Q6H PRN Lauro Franklin, MD   25 mg at 07/09/22 2102   loperamide (IMODIUM) capsule 2-4 mg  2-4 mg Oral PRN Lauro Franklin, MD   2 mg at 07/09/22 2102   LORazepam (ATIVAN) tablet 1 mg  1 mg Oral Q6H PRN Lauro Franklin, MD       LORazepam (ATIVAN) tablet 1 mg  1 mg Oral TID Lauro Franklin, MD       Followed by   Melene Muller ON 07/11/2022] LORazepam (ATIVAN) tablet 1 mg  1 mg Oral BID Lauro Franklin, MD       Followed by   Melene Muller ON 07/13/2022] LORazepam (ATIVAN) tablet 1 mg  1 mg Oral Daily Pashayan, Mardelle Matte, MD       magnesium hydroxide (MILK OF MAGNESIA) suspension 30 mL  30 mL Oral Daily PRN Lauro Franklin, MD       multivitamin with minerals tablet 1 tablet  1 tablet Oral Daily Lauro Franklin, MD   1 tablet at 07/10/22 0916   nicotine (NICODERM CQ - dosed in mg/24 hours) patch 21 mg  21 mg Transdermal  [id number] Lauro Franklin, MD   21 mg at 07/10/22 6045   nicotine polacrilex (NICORETTE) gum 2-4 mg  2-4 mg Oral PRN Lauro Franklin, MD   4 mg at 07/09/22 1546   ondansetron (ZOFRAN-ODT) disintegrating tablet 4 mg  4 mg Oral Q6H PRN Lauro Franklin, MD       risperiDONE (RISPERDAL) tablet 0.5 mg  0.5 mg Oral BID Lauro Franklin, MD   0.5 mg at 07/10/22 4098   sertraline (ZOLOFT) tablet 50 mg  50 mg Oral Daily Lauro Franklin, MD   50 mg at 07/10/22 0900   thiamine (VITAMIN B1) tablet 100 mg  100 mg Oral Daily Lauro Franklin, MD   100 mg at 07/10/22 0916   traZODone (DESYREL) tablet 50 mg  50 mg Oral QHS PRN Lauro Franklin, MD   50 mg at 07/09/22 2102    valbenazine (INGREZZA) capsule 40 mg  40 mg Oral Daily Lauro Franklin, MD   40 mg at 07/10/22 1191   Current Outpatient Medications  Medication Sig Dispense Refill   busPIRone (BUSPAR) 10 MG tablet Take 1.5 tablets (15 mg total) by mouth 2 (two) times daily. (Patient taking differently: Take 10 mg by mouth 2 (two) times daily.) 60 tablet 1   gabapentin (NEURONTIN) 400 MG capsule Take 1 capsule (400 mg total) by mouth 3 (three) times daily. (Patient taking differently: Take 400 mg by mouth 2 (two) times daily.) 90 capsule 0   risperiDONE (RISPERDAL) 3 MG tablet Take 1 tablet (3 mg total) by mouth 2 (two) times daily. 60 tablet 1   sertraline (ZOLOFT) 100 MG tablet Take 2 tablets (200 mg total) by mouth daily. 60 tablet 3   traZODone (DESYREL) 50 MG tablet Take 1-2 tablets (50-100 mg total) by mouth at bedtime. (Patient taking differently: Take 50 mg by mouth at bedtime as needed for sleep.) 60 tablet 3   valbenazine (INGREZZA) 40 MG capsule Take 1 capsule (40 mg total) by mouth daily. (Patient not taking: Reported on 07/07/2022) 30 capsule 2    Labs  Lab Results:  Admission on 07/09/2022  Component Date Value Ref Range Status   SARS Coronavirus 2 by RT PCR 07/09/2022 NEGATIVE  NEGATIVE Final   Performed at [facility], [address] [place], [place] [address]   WBC 07/09/2022 8.4  4.0 - 10.5 K/uL Final   RBC 07/09/2022 4.05 (L)  4.22 - 5.81 MIL/uL Final   Hemoglobin 07/09/2022 13.3  13.0 - 17.0 g/dL Final   HCT 56/21/3086 38.1 (L)  39.0 - 52.0 % Final   MCV 07/09/2022 94.1  80.0 - 100.0 fL Final   MCH 07/09/2022 32.8  26.0 - 34.0 pg Final   MCHC 07/09/2022 34.9  30.0 - 36.0 g/dL Final   RDW 57/84/6962 12.8  11.5 - 15.5 % Final   Platelets 07/09/2022 184  150 - 400 K/uL Final   nRBC 07/09/2022 0.0  0.0 - 0.2 % Final   Neutrophils Relative % 07/09/2022 69  % Final   Neutro Abs 07/09/2022 5.8  1.7 - 7.7 K/uL Final   Lymphocytes Relative 07/09/2022 20  % Final   Lymphs Abs  07/09/2022 1.7  0.7 - 4.0 K/uL Final   Monocytes Relative 07/09/2022 9  % Final   Monocytes Absolute 07/09/2022 0.8  0.1 - 1.0 K/uL Final   Eosinophils Relative 07/09/2022 1  % Final   Eosinophils Absolute 07/09/2022 0.1  0.0 - 0.5 K/uL Final  Basophils Relative 07/09/2022 0  % Final   Basophils Absolute 07/09/2022 0.0  0.0 - 0.1 K/uL Final   Immature Granulocytes 07/09/2022 1  % Final   Abs Immature Granulocytes 07/09/2022 0.05  0.00 - 0.07 K/uL Final   Performed at [facility], [address], [place], [place] [address]   Sodium 07/09/2022 133 (L)  135 - 145 mmol/L Final   Potassium 07/09/2022 3.9  3.5 - 5.1 mmol/L Final   Chloride 07/09/2022 94 (L)  98 - 111 mmol/L Final   CO2 07/09/2022 26  22 - 32 mmol/L Final   Glucose, Bld 07/09/2022 97  70 - 99 mg/dL Final   Glucose reference range applies only to samples taken after fasting for at least 8 hours.   BUN 07/09/2022 10  6 - 20 mg/dL Final   Creatinine, Ser 07/09/2022 1.16  0.61 - 1.24 mg/dL Final   Calcium 60/45/4098 9.2  8.9 - 10.3 mg/dL Final   Total Protein 11/91/4782 7.4  6.5 - 8.1 g/dL Final   Albumin 95/62/1308 2.9 (L)  3.5 - 5.0 g/dL Final   AST 65/78/4696 42 (H)  15 - 41 U/L Final   ALT 07/09/2022 29  0 - 44 U/L Final   Alkaline Phosphatase 07/09/2022 50  38 - 126 U/L Final   Total Bilirubin 07/09/2022 0.6  0.3 - 1.2 mg/dL Final   GFR, Estimated 07/09/2022 >60  >60 mL/min Final   Comment: (NOTE) Calculated using the CKD-EPI Creatinine Equation (2021)    Anion gap 07/09/2022 13  5 - 15 Final   Performed at [facility], [address] [place], [place] [address]   Hgb A1c MFr Bld 07/09/2022 5.3  4.8 - 5.6 % Final   Comment: (NOTE) Pre diabetes:          5.7%-6.4%  Diabetes:              >6.4%  Glycemic control for   <7.0% adults with diabetes    Mean Plasma Glucose 07/09/2022 105.41  mg/dL Final   Performed at [facility], [address] [place], [place] [address]   Cholesterol 07/09/2022  162  0 - 200 mg/dL Final   Triglycerides 40/12/2723 86  <150 mg/dL Final   HDL 36/64/4034 67  >40 mg/dL Final   Total CHOL/HDL Ratio 07/09/2022 2.4  RATIO Final   VLDL 07/09/2022 17  0 - 40 mg/dL Final   LDL Cholesterol 07/09/2022 78  0 - 99 mg/dL Final   Comment:        Total Cholesterol/HDL:CHD Risk Coronary Heart Disease Risk Table                     Men   Women  1/2 Average Risk   3.4   3.3  Average Risk       5.0   4.4  2 X Average Risk   9.6   7.1  3 X Average Risk  23.4   11.0        Use the calculated Patient Ratio above and the CHD Risk Table to determine the patient's CHD Risk.        ATP III CLASSIFICATION (LDL):  <100     mg/dL   Optimal  742-595  mg/dL   Near or Above                    Optimal  130-159  mg/dL   Borderline  638-756  mg/dL  High  >190     mg/dL   Very High Performed at [facility], [address], [place], [place] [address]    TSH 07/09/2022 2.179  0.350 - 4.500 uIU/mL Final   Comment: Performed by a 3rd Generation assay with a functional sensitivity of <=0.01 uIU/mL. Performed at [facility], [address] [place], [place] [address]   Admission on 07/07/2022, Discharged on 07/09/2022  Component Date Value Ref Range Status   WBC 07/07/2022 6.8  4.0 - 10.5 K/uL Final   RBC 07/07/2022 5.12  4.22 - 5.81 MIL/uL Final   Hemoglobin 07/07/2022 16.5  13.0 - 17.0 g/dL Final   HCT 09/81/1914 47.6  39.0 - 52.0 % Final   MCV 07/07/2022 93.0  80.0 - 100.0 fL Final   MCH 07/07/2022 32.2  26.0 - 34.0 pg Final   MCHC 07/07/2022 34.7  30.0 - 36.0 g/dL Final   RDW 78/29/5621 12.6  11.5 - 15.5 % Final   Platelets 07/07/2022 276  150 - 400 K/uL Final   nRBC 07/07/2022 0.0  0.0 - 0.2 % Final   Neutrophils Relative % 07/07/2022 59  % Final   Neutro Abs 07/07/2022 4.0  1.7 - 7.7 K/uL Final   Lymphocytes Relative 07/07/2022 27  % Final   Lymphs Abs 07/07/2022 1.8  0.7 - 4.0 K/uL Final   Monocytes Relative 07/07/2022 12  % Final   Monocytes  Absolute 07/07/2022 0.8  0.1 - 1.0 K/uL Final   Eosinophils Relative 07/07/2022 0  % Final   Eosinophils Absolute 07/07/2022 0.0  0.0 - 0.5 K/uL Final   Basophils Relative 07/07/2022 1  % Final   Basophils Absolute 07/07/2022 0.1  0.0 - 0.1 K/uL Final   Immature Granulocytes 07/07/2022 1  % Final   Abs Immature Granulocytes 07/07/2022 0.04  0.00 - 0.07 K/uL Final   Performed at [facility], [address] [place], [place] [address]   Alcohol, Ethyl (B) 07/07/2022 175 (H)  <10 mg/dL Final   Comment: (NOTE) Lowest detectable limit for serum alcohol is 10 mg/dL.  For medical purposes only. Performed at [facility], [address] [place], [place] [address]    Opiates 07/07/2022 NONE DETECTED  NONE DETECTED Final   Cocaine 07/07/2022 NONE DETECTED  NONE DETECTED Final   Benzodiazepines 07/07/2022 NONE DETECTED  NONE DETECTED Final   Amphetamines 07/07/2022 NONE DETECTED  NONE DETECTED Final   Tetrahydrocannabinol 07/07/2022 POSITIVE (A)  NONE DETECTED Final   Barbiturates 07/07/2022 NONE DETECTED  NONE DETECTED Final   Comment: (NOTE) DRUG SCREEN FOR MEDICAL PURPOSES ONLY.  IF CONFIRMATION IS NEEDED FOR ANY PURPOSE, NOTIFY LAB WITHIN 5 DAYS.  LOWEST DETECTABLE LIMITS FOR URINE DRUG SCREEN Drug Class                     Cutoff (ng/mL) Amphetamine and metabolites    1000 Barbiturate and metabolites    200 Benzodiazepine                 200 Opiates and metabolites        300 Cocaine and metabolites        300 THC                            50 Performed at [facility], [address] [place], [place] [address]    Sodium 07/07/2022 129 (L)  135 -  145 mmol/L Final   Potassium 07/07/2022 3.0 (L)  3.5 - 5.1 mmol/L Final   Chloride 07/07/2022 95 (L)  98 - 111 mmol/L Final   CO2 07/07/2022 18 (L)  22 - 32 mmol/L Final   Glucose, Bld 07/07/2022 97  70 - 99 mg/dL Final   Glucose reference range applies only to samples taken  after fasting for at least 8 hours.   BUN 07/07/2022 6  6 - 20 mg/dL Final   Creatinine, Ser 07/07/2022 0.75  0.61 - 1.24 mg/dL Final   Calcium 82/95/6213 7.8 (L)  8.9 - 10.3 mg/dL Final   Total Protein 08/65/7846 8.1  6.5 - 8.1 g/dL Final   Albumin 96/29/5284 3.2 (L)  3.5 - 5.0 g/dL Final   AST 13/24/4010 57 (H)  15 - 41 U/L Final   ALT 07/07/2022 38  0 - 44 U/L Final   Alkaline Phosphatase 07/07/2022 51  38 - 126 U/L Final   Total Bilirubin 07/07/2022 0.9  0.3 - 1.2 mg/dL Final   GFR, Estimated 07/07/2022 >60  >60 mL/min Final   Comment: (NOTE) Calculated using the CKD-EPI Creatinine Equation (2021)    Anion gap 07/07/2022 16 (H)  5 - 15 Final   Performed at [facility], [address], [place], [place] [address]   SARS Coronavirus 2 by RT PCR 07/07/2022 NEGATIVE  NEGATIVE Final   Comment: (NOTE) SARS-CoV-2 target nucleic acids are NOT DETECTED.  The SARS-CoV-2 RNA is generally detectable in upper and lower respiratory specimens during the acute phase of infection. The lowest concentration of SARS-CoV-2 viral copies this assay can detect is 250 copies / mL. A negative result does not preclude SARS-CoV-2 infection and should not be used as the sole basis for treatment or other patient management decisions.  A negative result may occur with improper specimen collection / handling, submission of specimen other than nasopharyngeal swab, presence of viral mutation(s) within the areas targeted by this assay, and inadequate number of viral copies (<250 copies / mL). A negative result must be combined with clinical observations, patient history, and epidemiological information.  Fact Sheet for Patients:   RoadLapTop.co.za  Fact Sheet for Healthcare Providers: http://kim-miller.com/  This test is not yet approved or                           cleared by the [place] FDA and has been authorized for detection and/or  diagnosis of SARS-CoV-2 by FDA under an Emergency Use Authorization (EUA).  This EUA will remain in effect (meaning this test can be used) for the duration of the COVID-19 declaration under Section 564(b)(1) of the Act, 21 U.S.C. section 360bbb-3(b)(1), unless the authorization is terminated or revoked sooner.  Performed at [facility], [address] [place], [place] [address]     Blood Alcohol level:  Lab Results  Component Value Date   ETH 175 (H) 07/07/2022   ETH 270 (H) 07/16/2021    Metabolic Disorder Labs: Lab Results  Component Value Date   HGBA1C 5.3 07/09/2022   MPG 105.41 07/09/2022   MPG 111.15 10/31/2020   No results found for: "PROLACTIN" Lab Results  Component Value Date   CHOL 162 07/09/2022   TRIG 86 07/09/2022   HDL 67 07/09/2022   CHOLHDL 2.4 07/09/2022   VLDL 17 07/09/2022   LDLCALC 78 07/09/2022   LDLCALC 82 10/31/2020    Therapeutic Lab Levels: No results found for: "LITHIUM" No  results found for: "VALPROATE" No results found for: "CBMZ"  Physical Findings   AIMS    Flowsheet Row Clinical Support from 05/19/2022 in [facility]) [facility]) [facility]  AIMS Total Score 10 0 0      AUDIT    Flowsheet Row [facility]) [facility] Ad[facility] (Discharged[facility]  Alcohol Use Disorder Identification Test Final Score (AUDIT) 31 0      GAD-7    Flowsheet Row Video Visit from 01/26/2022 in [facility] Video Visit from 11/01/2021 in [facility] Video Visit from 03/22/2021 in [facility] Office Visit from 12/21/2020 in [facility]  Total GAD-7 Score PHQ2-9    Flowsheet Row ED from 07/09/2022 in [facility] Video Visit from 01/26/2022 in [facility] Video Visit from 11/01/2021 in [facility] Video Visit from 03/22/2021 in [facility] Counselor from 02/24/2021 in [facility]  PHQ-2 Total Score PHQ-9 Total Score Flowsheet Row ED [facility] 07/09/2022 in [facility] from 07/07/2022 in [facility] Emergency Department at [facility] ED from 01/11/2022 in [facility] at [facility] RISK CATEGORY No Risk No Risk No Risk        Musculoskeletal  Strength & Muscle Tone: within normal limits Gait & Station: normal Patient leans: N/A  AIMS: Tardive dyskinesia (mouth fasciculations) noted on exam  Psychiatric Specialty Exam  General Appearance: appears older than stated age, casually dressed and groomed   Behavior: pleasant and cooperative   Psychomotor Activity: no psychomotor agitation or retardation noted   Eye Contact: fair  Speech: normal amount, tone, volume and fluency    Mood: euthymic  Affect: congruent, pleasant and interactive   Thought Process: linear, goal directed, no circumstantial or tangential thought process noted, no racing thoughts or flight of ideas  Descriptions of Associations: intact   Thought Content Hallucinations: denies AH, VH , does not appear responding to stimuli  Delusions: no paranoia, delusions of control, grandeur, ideas of reference, thought broadcasting, and magical thinking  Suicidal Thoughts: denies SI, intention, plan  Homicidal Thoughts: denies HI, intention, plan   Alertness/Orientation: alert and fully oriented   Insight: limited Judgment: fair  Memory: intact   Executive Functions  Concentration: intact  Attention  Span: fair  Recall: intact  Fund of Knowledge: fair     Nutritional Assessment (For OBS and FBC admissions only) Has the patient had a weight loss or gain of 10 pounds or more in the last 3 months?: No Has the patient had a decrease in food intake/or appetite?: Yes Does the patient have dental problems?: Yes Does the patient have eating habits or behaviors that may be indicators of an eating disorder including binging or inducing vomiting?: No Has the patient recently lost weight without trying?: 0 Has the patient been eating poorly because of a decreased appetite?: 1 Malnutrition Screening Tool Score: 1    Physical Exam  Physical Exam  General: Pleasant, disheveled male. No acute distress. Pulmonary: Normal effort.  No wheezing or rales. Skin: No obvious rash or lesions. Neuro: A&Ox3.No focal deficit.   Review of Systems  Constitutional: Negative.  Negative for chills, fever and weight loss.  Respiratory: Negative.    Cardiovascular: Negative.   Gastrointestinal:  Negative for constipation, diarrhea, nausea and vomiting.  Musculoskeletal: Negative.   Skin: Negative.   Neurological: Negative.  Negative for tingling.    Blood pressure 128/84, pulse 68, temperature 98.7 F (37.1 C), temperature source Oral, resp. rate 18, SpO2 99 %. There is no height or weight on file to calculate BMI.  Treatment Plan Summary: Daily contact with patient to assess and evaluate symptoms and progress in treatment, Medication management, and Plan :  Withdrawal: -CIWA 0 at 1201 at 4/22 -Continue Ativan 1 mg q6 PRN CIWA>10 -Continue Ativan taper to end 4/25 -Continue Imodium 2-4 mg PRN diarrhea -Continue Zofran-ODT 4 mg q6 PRN nausea -Start Thiamine 100 mg daily for nutritional supplementation -Start Multivitamin daily for nutritional supplementation     Schizophrenia: -Restart Risperdal 0.5 mg BID for psychosis -Restart Ingrezza 40 mg daily for TD -AIMS on admission= 13     GAD   PTSD: -Increase Zoloft 25 mg to 50 mg today for depression and anxiety     Nicotine Dependence: -Start Nicotine Patch 21 mg daily -Start Nicotine Gum 2-4 mg PRN     Hypnoatremia: -Na 129>133    -Continue PRN's: Tylenol, Maalox, Atarax, Milk of Magnesia, Trazodone     Dispo: Home with therapy resources  Lance Muss, MD 07/10/2022 10:52 AM

## 2022-07-10 NOTE — Care Management (Signed)
[facility] Care Management   Outpatient Services for Therapy and Medication Management for Medicaid  Based on what you have shared, a list of resources for outpatient therapy and psychiatry is provided below to get you started back on treatment.  It is imperative that you follow through with treatment within 5-7 days from the day of discharge to prevent any further risk to your safety or mental well-being.  You are not limited to the list provided.  In case of an urgent crisis, you may contact the Mobile Crisis Unit with [facility] at [phone].         [facility] [address], [place], [address] [phone] phone  New Patient Assessment/Therapy Walk-ins Monday and Wednesday: 8am until slots are full. Every 1st and 2nd Friday: 1pm - 5pm  NO ASSESSMENT/THERAPY WALK-INS ON TUESDAYS OR THURSDAYS  New Patient Psychiatry/Medication Management Walk-ins Monday-Friday: 8am-11am  For all walk-ins, we ask that you arrive by 7:30am because patient will be seen in the order of arrival.  Availability is limited; therefore, you may not be seen on the same day that you walk-in.  Our goal is to serve and meet the needs of our community to the best of our ability.   [facility] [address] [place], [place], [address] [phone] phone  [facility] [address] [place], [place], [address] [phone] phone [phone] phone ([address], [address], Anthem/Elevance, [address], [address], [address], [address], Healthy [place], [place], [place], [address], [employer], [place], UHC, [employer] Financial, [place], Out of Network)  [employer], [place] [address] [place], [place], [address] [phone] phone ([place], Anthem/Elevance, [employer] Options/Carelon, BCBS, [address], [place], [place], [place], [place], [employer], [place], [place], [place],  [facility])  [employer] [address] [place], [place], [address] [phone] phone (Medicaid, ask about other insurance)  [facility] [address] [place], [place], [address] [phone] phone [phone] fax ([address] [place] , [place], [place], [place] Health Choice, UHC, General Electric, Self-Pay)  [facility] [address] [place], [place], [address] [phone] phone ([address], Anthem/Elevance, [address], [address], [place], [employer]X [employer], [place], [place], [place], [employer], [place], [place], [place], [facility])  [facility] - 6-8 MONTH WAIT FOR THERAPY; SOONER FOR MEDICATION MANAGEMENT [address] [place], [place], [address] [phone] phone ([address], AmeriHealth [address] [place], [address], [place], [place], Friday Health Plans, [address], BCBS Healthy [place], [place], [address], [place], [place], [place], [place], Tricare, UHC, [employer]co Corporation, [place])  [facility] [address] [place], [place], [address] [phone] phone  [facility] [address] [place], [place], [address] [phone] phone  [facility] [address] [place], [place], [address] [phone] phone  [facility] [place] - THERAPY ONLY [address], [place], [address] [phone] phone  [facility], [place] [address], [place], [address] [phone] phone  [facility] [address] [place], [place], [address] [phone] phone [phone] fax  [facility] [address] [place], [place], [address] [phone] phone [phone] fax  [facility] [phone] [address] [place], [place], [address] [phone] phone  [facility]ussmaul [address] [place], [place], [address]  [phone] phone  [facility]  (Adults Only) [address] [place], [place], [address]  [phone] phone [phone] fax

## 2022-07-10 NOTE — Discharge Instructions (Signed)
   Outpatient Services for Therapy and Medication Management for Medicaid  Based on what you have shared, a list of resources for outpatient therapy and psychiatry is provided below to get you started back on treatment.  It is imperative that you follow through with treatment within 5-7 days from the day of discharge to prevent any further risk to your safety or mental well-being.  You are not limited to the list provided.  In case of an urgent crisis, you may contact the Mobile Crisis Unit with [facility] at [phone].         [facility] [address], [place], [address] [phone] phone  New Patient Assessment/Therapy Walk-ins Monday and Wednesday: 8am until slots are full. Every 1st and 2nd Friday: 1pm - 5pm  NO ASSESSMENT/THERAPY WALK-INS ON TUESDAYS OR THURSDAYS  New Patient Psychiatry/Medication Management Walk-ins Monday-Friday: 8am-11am  For all walk-ins, we ask that you arrive by 7:30am because patient will be seen in the order of arrival.  Availability is limited; therefore, you may not be seen on the same day that you walk-in.  Our goal is to serve and meet the needs of our community to the best of our ability.   [facility] [address] [place], [place], [address] [phone] phone  [facility] [address] [place], [place], [address] [phone] phone [phone] phone ([address], [address], Anthem/Elevance, [address], [address], [address], [address], Healthy [place], [place], [place], [address], [employer], [place], UHC, [employer], [place], Out of Network)  [employer] [employer] [place] [address] [place], [place], [address] [phone] phone ([place], Anthem/Elevance, San[employer] Options/Carelon, BCBS, [address], [place], [place], [place], [place], [employer], [place], [place], [place], [facility])  [employer] [address] [place], [place], [address] [phone] phone (Medicaid, ask about other insurance)  [facility] [address] [place], [place], [address] [phone] phone [phone] fax ([address], [place] , [place], [place], [place] Health Choice, UHC, [employer], Self-Pay)  [facility] [address] [place], [place], [address] [phone] phone ([address], Anthem/Elevance, [address], [address], [place], [employer], [place], [place], [place], [employer], [place], [place], [place], [facility])  [facility] - 6-8 MONTH WAIT FOR THERAPY; [facility]NAGEMENT [address] [place], [place], [address] [phone] phone ([address] AmeriHealth [address] - [place], [address], [place], [place], Friday Health Plans, [address], BCBS Healthy [place], [place], [address], [place], [place], [place], [place], Tricare, UHC, [employer] Corporation, [place])  [facility] [address] [place], [place], [address] [phone] phone  [facility] [address] [place], [place], [address] [phone] phone  [facility] [address] [place], [place], [address] [phone] phone  [facility] - THERAPY ONLY [address] [place], [address] [phone] phone  [facility] [place] [address], [place], [address] [phone] phone  [facility] [address] [place], [place], [address] [phone] phone [phone] fax  [facility] [address] [place], [place], [address] [phone] phone [phone] fax  [facility] [phone] [address] [place], [place], [address] [phone] phone  Jovita Kussmaul [address] [place], [place], [address]  [phone] phone  [facility]  (Adults Only) [address] [place], [place], [address]  [phone] phone [phone] fax c

## 2022-07-10 NOTE — Group Note (Deleted)
Group Topic: Communication  Group Date: 07/10/2022 Start Time: 0900 End Time: 0930 Facilitators: Prentice Docker, RN  Department: [facility]  Number of Participants: 4  Group Focus: other medication education Treatment Modality:  Psychoeducation Interventions utilized were patient education Purpose: increase insight   Name: Jerry [name] Date of Birth: 1976-02-05  MR: [id number]    Level of Participation: {THERAPIES; PSYCH GROUP PARTICIPATION GEXBM:84132} Quality of Participation: {THERAPIES; PSYCH QUALITY OF PARTICIPATION:23992} Interactions with others: {THERAPIES; PSYCH INTERACTIONS:23993} Mood/Affect: {THERAPIES; PSYCH MOOD/AFFECT:23994} Triggers (if applicable): *** Cognition: {THERAPIES; PSYCH COGNITION:23995} Progress: {THERAPIES; PSYCH PROGRESS:23997} Response: *** Plan: {THERAPIES; PSYCH GMWN:02725}  Patients Problems:  Patient Active Problem List   Diagnosis Date Noted   Schizoaffective disorder, bipolar type 07/09/2022   Schizophrenia 07/07/2022   Auditory hallucination    Malingering 09/07/2020   Homelessness 09/07/2020   Substance induced mood disorder 09/07/2020   Major depressive disorder, recurrent severe without psychotic features 07/17/2020   Schizophrenia spectrum disorder with psychotic disorder type not yet determined 07/17/2020   Methamphetamine abuse 07/17/2020   Marijuana abuse 07/17/2020   Cocaine abuse 07/17/2020   Anxiety and depression 06/25/2014   Family history of diabetes mellitus (DM) 06/25/2014   Family history of thyroid disease 06/25/2014   Tobacco abuse 06/25/2014   Alcohol abuse 05/18/2013   Polysubstance abuse (HCC) 05/18/2013   Depression, unspecified 05/18/2013

## 2022-07-10 NOTE — ED Notes (Signed)
Patient A&Ox4. Denies intent to harm self/others when asked. Denies A/VH. Patient denies any physical complaints when asked. No acute distress noted. Pt request to discharge today. Pt states, "I can't sleep on that mattress. I feel much better. I would like to discharge". Writer explained process and that he would speak with a provider and they would come up with a plan for discharge today. Pt verbalized understand and agreement. Support and encouragement provided. Routine safety checks conducted according to facility protocol. Encouraged patient to notify staff if thoughts of harm toward self or others arise. Patient verbalize understanding and agreement. Will continue to monitor for safety.

## 2022-07-10 NOTE — ED Notes (Signed)
Pt sleeping@this time. Breathing even and unlabored. Will continue to monitor for safety 

## 2022-07-11 ENCOUNTER — Other Ambulatory Visit: Payer: Self-pay

## 2022-07-11 DIAGNOSIS — F1010 Alcohol abuse, uncomplicated: Secondary | ICD-10-CM | POA: Diagnosis not present

## 2022-07-11 MED ORDER — BUSPIRONE HCL 10 MG PO TABS
10.0000 mg | ORAL_TABLET | Freq: Two times a day (BID) | ORAL | 0 refills | Status: DC
Start: 2022-07-11 — End: 2022-07-26
  Filled 2022-07-11: qty 60, 30d supply, fill #0

## 2022-07-11 MED ORDER — SERTRALINE HCL 50 MG PO TABS
50.0000 mg | ORAL_TABLET | Freq: Every day | ORAL | 0 refills | Status: DC
  Filled 2022-07-11: qty 30, 30d supply, fill #0

## 2022-07-11 MED ORDER — TRAZODONE HCL 50 MG PO TABS
50.0000 mg | ORAL_TABLET | Freq: Every evening | ORAL | 0 refills | Status: DC | PRN
Start: 2022-07-11 — End: 2022-07-26
  Filled 2022-07-11: qty 30, 30d supply, fill #0

## 2022-07-11 MED ORDER — VALBENAZINE TOSYLATE 40 MG PO CAPS
40.0000 mg | ORAL_CAPSULE | Freq: Every day | ORAL | 0 refills | Status: DC
Start: 2022-07-11 — End: 2022-07-26
  Filled 2022-07-11: qty 30, 30d supply, fill #0

## 2022-07-11 MED ORDER — NICOTINE 21 MG/24HR TD PT24
21.0000 mg | MEDICATED_PATCH | Freq: Every day | TRANSDERMAL | 0 refills | Status: DC
  Filled 2022-07-11: qty 28, 28d supply, fill #0

## 2022-07-11 MED ORDER — RISPERIDONE 0.5 MG PO TABS
0.5000 mg | ORAL_TABLET | Freq: Two times a day (BID) | ORAL | 0 refills | Status: DC
  Filled 2022-07-11: qty 60, 30d supply, fill #0

## 2022-07-11 MED ORDER — GABAPENTIN 400 MG PO CAPS
400.0000 mg | ORAL_CAPSULE | Freq: Two times a day (BID) | ORAL | 0 refills | Status: DC
Start: 2022-07-11 — End: 2022-07-26
  Filled 2022-07-11: qty 60, 30d supply, fill #0

## 2022-07-11 NOTE — ED Provider Notes (Signed)
FBC/OBS ASAP Discharge Summary  Date and Time: 07/11/2022 12:09 PM  Name: Jerry [name]  MRN:  [id number]   Discharge Diagnoses:  Final diagnoses:  ETOH abuse  Schizophrenia, unspecified type    Subjective: Jerry [name] is a 47 yr old male who presented on 4/19 to [facility] requesting detox from EtOH and to restart his medications. PPHx is significant for Schizophrenia, GAD, PTSD, and Polysubstance Abuse (EtOH, Meth, Cocaine, and THC), and 2 Suicide Attempts (last 10/2020 OD), multiple Psychiatric Hospitalizations (last [facility] 10/2020), and has been to Rehab multiple times, and no history of Self Injurious Behavior. He does have a history of Withdrawal Seizures.   Stay Summary: The patient was evaluated each day by a clinical provider to ascertain response to treatment. Improvement was noted by the patient's report of decreasing symptoms, improved sleep and appetite, affect, medication tolerance, behavior, and some participation in unit programming.  Patient was asked each day to complete a self inventory noting mood, mental status, pain, new symptoms, anxiety and concerns.   Patient responded well to medication and being in a therapeutic and supportive environment. Positive and appropriate behavior was noted and the patient was motivated for recovery. The patient worked closely with the treatment team and case manager to develop a discharge plan with appropriate goals.    By the day of discharge patient was in much improved condition than upon admission.  Symptoms were reported as significantly decreased or resolved completely. The patient denied SI/HI and voiced no AVH. The patient was motivated to continue taking medication with a goal of continued improvement in mental health.    Total Time spent with patient: 30 minutes  Past Psychiatric History: Schizophrenia (diagnosed 4 years ago but unclear if his symptoms are always in the context of substance use), GAD, PTSD, and Polysubstance Abuse (EtOH,  Meth, Cocaine, and THC), and 2 Suicide Attempts (last 10/2020 OD), multiple Psychiatric Hospitalizations (last [facility] 10/2020), and has been to Rehab multiple times, and no history of Self Injurious Behavior.  He does have a history of Withdrawal Seizures.  Past Medical History: Withdrawal Seizures  Family History: Mother- substance use, Son- Meth Abuse,two sons- Self Injurious Behavior- cutting   Social History: Unemployed, lives with wife Tobacco Cessation:  A prescription for an FDA-approved tobacco cessation medication provided at discharge  Current Medications:  Current Facility-Administered Medications  Medication Dose Route Frequency Provider Last Rate Last Admin   acetaminophen (TYLENOL) tablet 650 mg  650 mg Oral Q6H PRN Lauro Franklin, MD       alum & mag hydroxide-simeth (MAALOX/MYLANTA) 200-200-20 MG/5ML suspension 30 mL  30 mL Oral Q4H PRN Lauro Franklin, MD       hydrOXYzine (ATARAX) tablet 25 mg  25 mg Oral Q6H PRN Lauro Franklin, MD   25 mg at 07/09/22 2102   loperamide (IMODIUM) capsule 2-4 mg  2-4 mg Oral PRN Lauro Franklin, MD   2 mg at 07/09/22 2102   LORazepam (ATIVAN) tablet 1 mg  1 mg Oral Q6H PRN Lauro Franklin, MD       LORazepam (ATIVAN) tablet 1 mg  1 mg Oral BID Lauro Franklin, MD       Followed by   Melene Muller ON 07/13/2022] LORazepam (ATIVAN) tablet 1 mg  1 mg Oral Daily Pashayan, Mardelle Matte, MD       magnesium hydroxide (MILK OF MAGNESIA) suspension 30 mL  30 mL Oral Daily PRN Renaldo Fiddler Mardelle Matte, MD  multivitamin with minerals tablet 1 tablet  1 tablet Oral Daily Lauro Franklin, MD   1 tablet at 07/11/22 0941   nicotine (NICODERM CQ - dosed in mg/24 hours) patch 21 mg  21 mg Transdermal Q0600 Lauro Franklin, MD   21 mg at 07/11/22 0941   nicotine polacrilex (NICORETTE) gum 2-4 mg  2-4 mg Oral PRN Lauro Franklin, MD   4 mg at 07/09/22 1546   ondansetron (ZOFRAN-ODT) disintegrating tablet 4 mg  4 mg Oral  Q6H PRN Lauro Franklin, MD       risperiDONE (RISPERDAL) tablet 0.5 mg  0.5 mg Oral BID Lauro Franklin, MD   0.5 mg at 07/11/22 0941   sertraline (ZOLOFT) tablet 50 mg  50 mg Oral Daily Lauro Franklin, MD   50 mg at 07/11/22 0941   thiamine (VITAMIN B1) tablet 100 mg  100 mg Oral Daily Lauro Franklin, MD   100 mg at 07/11/22 0941   traZODone (DESYREL) tablet 50 mg  50 mg Oral QHS PRN Lauro Franklin, MD   50 mg at 07/09/22 2102   valbenazine (INGREZZA) capsule 40 mg  40 mg Oral Daily Lauro Franklin, MD   40 mg at 07/11/22 9604   Current Outpatient Medications  Medication Sig Dispense Refill   busPIRone (BUSPAR) 10 MG tablet Take 1 tablet (10 mg total) by mouth 2 (two) times daily. 60 tablet 0   gabapentin (NEURONTIN) 400 MG capsule Take 1 capsule (400 mg total) by mouth 2 (two) times daily. 60 capsule 0   nicotine (NICODERM CQ - DOSED IN MG/24 HOURS) 21 mg/24hr patch Place 1 patch (21 mg total) onto the skin daily at 6 (six) AM. 28 patch 0   risperiDONE (RISPERDAL) 0.5 MG tablet Take 1 tablet (0.5 mg total) by mouth 2 (two) times daily. 60 tablet 0   sertraline (ZOLOFT) 50 MG tablet Take 1 tablet (50 mg total) by mouth daily. 30 tablet 0   traZODone (DESYREL) 50 MG tablet Take 1 tablet (50 mg total) by mouth at bedtime as needed for sleep. 30 tablet 0   valbenazine (INGREZZA) 40 MG capsule Take 1 capsule (40 mg total) by mouth daily. 30 capsule 0    PTA Medications:  PTA Medications  Medication Sig   busPIRone (BUSPAR) 10 MG tablet Take 1 tablet (10 mg total) by mouth 2 (two) times daily.   nicotine (NICODERM CQ - DOSED IN MG/24 HOURS) 21 mg/24hr patch Place 1 patch (21 mg total) onto the skin daily at 6 (six) AM.   risperiDONE (RISPERDAL) 0.5 MG tablet Take 1 tablet (0.5 mg total) by mouth 2 (two) times daily.   sertraline (ZOLOFT) 50 MG tablet Take 1 tablet (50 mg total) by mouth daily.   valbenazine (INGREZZA) 40 MG capsule Take 1 capsule (40 mg  total) by mouth daily.   gabapentin (NEURONTIN) 400 MG capsule Take 1 capsule (400 mg total) by mouth 2 (two) times daily.   traZODone (DESYREL) 50 MG tablet Take 1 tablet (50 mg total) by mouth at bedtime as needed for sleep.   Facility Ordered Medications  Medication   [EXPIRED] LORazepam (ATIVAN) injection 0-4 mg   Or   [EXPIRED] LORazepam (ATIVAN) tablet 0-4 mg   acetaminophen (TYLENOL) tablet 650 mg   alum & mag hydroxide-simeth (MAALOX/MYLANTA) 200-200-20 MG/5ML suspension 30 mL   magnesium hydroxide (MILK OF MAGNESIA) suspension 30 mL   nicotine (NICODERM CQ - dosed in mg/24 hours) patch 21 mg  traZODone (DESYREL) tablet 50 mg   nicotine polacrilex (NICORETTE) gum 2-4 mg   thiamine (VITAMIN B1) tablet 100 mg   multivitamin with minerals tablet 1 tablet   LORazepam (ATIVAN) tablet 1 mg   hydrOXYzine (ATARAX) tablet 25 mg   loperamide (IMODIUM) capsule 2-4 mg   ondansetron (ZOFRAN-ODT) disintegrating tablet 4 mg   [COMPLETED] LORazepam (ATIVAN) tablet 1 mg   Followed by   [COMPLETED] LORazepam (ATIVAN) tablet 1 mg   Followed by   LORazepam (ATIVAN) tablet 1 mg   Followed by   Melene Muller ON 07/13/2022] LORazepam (ATIVAN) tablet 1 mg   risperiDONE (RISPERDAL) tablet 0.5 mg   valbenazine (INGREZZA) capsule 40 mg   [COMPLETED] sertraline (ZOLOFT) tablet 25 mg   sertraline (ZOLOFT) tablet 50 mg       07/11/2022    9:36 AM 07/09/2022    2:03 PM 01/26/2022   11:11 AM  Depression screen PHQ 2/9  Decreased Interest 0 2 1  Down, Depressed, Hopeless 0 3 1  PHQ - 2 Score 0 5 2  Altered sleeping  3 0  Tired, decreased energy  2 2  Change in appetite  3 0  Feeling bad or failure about yourself   3 2  Trouble concentrating  3 1  Moving slowly or fidgety/restless  1 1  Suicidal thoughts  2 0  PHQ-9 Score  22 8    Flowsheet Row ED from 07/09/2022 in [facility] ED from 07/07/2022 in [facility] ED from  01/11/2022 in [facility] RISK CATEGORY No Risk No Risk No Risk       Musculoskeletal  Strength & Muscle Tone: within normal limits Gait & Station: normal Patient leans: N/A  Psychiatric Specialty Exam  Presentation  General Appearance:  Appropriate for Environment; Casual  Eye Contact: Good  Speech: Clear and Coherent; Normal Rate  Speech Volume: Normal  Handedness: Right   Mood and Affect  Mood: Euthymic  Affect: Appropriate; Congruent   Thought Process  Thought Processes: Coherent; Linear  Descriptions of Associations:Intact  Orientation:Full (Time, Place and Person)  Thought Content:Logical; WDL  Diagnosis of Schizophrenia or Schizoaffective disorder in past: No  Duration of Psychotic Symptoms: Greater than six months   Hallucinations:Hallucinations: None  Ideas of Reference:None  Suicidal Thoughts:Suicidal Thoughts: No  Homicidal Thoughts:Homicidal Thoughts: No   Sensorium  Memory: Immediate Good; Recent Good  Judgment: Fair  Insight: Fair   Art therapist  Concentration: Good  Attention Span: Good  Recall: Good  Fund of Knowledge: Good  Language: Good   Psychomotor Activity  Psychomotor Activity:Psychomotor Activity: Extrapyramidal Side Effects (EPS) Extrapyramidal Side Effects (EPS): Tardive Dyskinesia   Assets  Assets: Communication Skills; Desire for Improvement; Social Support; Resilience   Sleep  Sleep:Sleep: Good    Physical Exam  Physical Exam  General: Pleasant, disheveled male. No acute distress. HEENT: Involuntary perioral movements c/w TD Pulmonary: Normal effort. No wheezing or rales. Skin: No obvious rash or lesions. Neuro: A&Ox3.No focal deficit.     Review of Systems  Constitutional: Negative.  Negative for chills, fever and weight loss.  Respiratory: Negative.    Cardiovascular: Negative.   Gastrointestinal:  Negative for constipation,  diarrhea, nausea and vomiting.  Musculoskeletal: Negative.   Skin: Negative.   Neurological: Negative.  Negative for tingling.   Blood pressure (!) 123/90, pulse (!) 104, temperature 98.4 F (36.9 C), temperature source Oral, resp. rate 18, SpO2 100 %.  There is no height or weight on file to calculate BMI.  Demographic Factors:  Male, Caucasian, Low socioeconomic status, and Unemployed  Loss Factors: Financial problems/change in socioeconomic status  Historical Factors: Family history of mental illness or substance abuse and Impulsivity  Risk Reduction Factors:   Sense of responsibility to family, Living with another person, especially a relative, and Positive social support  Continued Clinical Symptoms:  Severe Anxiety and/or Agitation Alcohol/Substance Abuse/Dependencies Schizophrenia:   Paranoid or undifferentiated type Unstable or Poor Therapeutic Relationship Previous Psychiatric Diagnoses and Treatments  Cognitive Features That Contribute To Risk:  None    Suicide Risk:  Mild: There are no identifiable plans, no associated intent, mild dysphoria and related symptoms, good self-control (both objective and subjective assessment), few other risk factors, and identifiable protective factors, including available and accessible social support.  Plan Of Care/Follow-up recommendations:  Follow-up recommendations:  Activity:  Normal, as tolerated Diet:  Per PCP recommendation  Patient is instructed prior to discharge to: Take all medications as prescribed by his mental healthcare provider. Report any adverse effects and/or reactions from the medicines to his outpatient provider promptly. Patient has been instructed & cautioned: To not engage in alcohol and or illegal drug use while on prescription medicines.  In the event of worsening symptoms, patient is instructed to call the crisis hotline at 988, 911 and or go to the nearest ED for appropriate evaluation and treatment of  symptoms. To follow-up with his primary care provider for your other medical issues, concerns and or health care needs.   Disposition: Home with wife and outpatient resources  Lamar Sprinkles, MD 07/11/2022, 12:09 PM

## 2022-07-11 NOTE — Group Note (Signed)
Group Topic: Communication  Group Date: 07/11/2022 Start Time: 1030 End Time: 1100 Facilitators: Bertram Denver  Department: [facility]  Number of Participants: 5  Group Focus: goals/reality orientation Treatment Modality:  Cognitive Behavioral Therapy Interventions utilized were group exercise Purpose: enhance coping skills  Name: Jerry [name] Date of Birth: 1975/08/28  MR: [id number]    Level of Participation: active Quality of Participation: attentive Interactions with others: gave feedback Mood/Affect: positive Triggers (if applicable): n/a Cognition: insightful Progress: Moderate Response: n/a Plan: follow-up needed  Patients Problems:  Patient Active Problem List   Diagnosis Date Noted   Schizoaffective disorder, bipolar type 07/09/2022   Schizophrenia 07/07/2022   Auditory hallucination    Malingering 09/07/2020   Homelessness 09/07/2020   Substance induced mood disorder 09/07/2020   Major depressive disorder, recurrent severe without psychotic features 07/17/2020   Schizophrenia spectrum disorder with psychotic disorder type not yet determined 07/17/2020   Methamphetamine abuse 07/17/2020   Marijuana abuse 07/17/2020   Cocaine abuse 07/17/2020   Anxiety and depression 06/25/2014   Family history of diabetes mellitus (DM) 06/25/2014   Family history of thyroid disease 06/25/2014   Tobacco abuse 06/25/2014   Alcohol abuse 05/18/2013   Polysubstance abuse (HCC) 05/18/2013   Depression, unspecified 05/18/2013

## 2022-07-11 NOTE — ED Notes (Signed)
Patient awake and alert on unit.  He is calm and cooperative with care.  He is focuased on pending discharge later today.  He is now eating breakfast in dayr oom.  Will monitor and provide support.

## 2022-07-15 ENCOUNTER — Other Ambulatory Visit: Payer: Self-pay

## 2022-07-15 ENCOUNTER — Emergency Department (HOSPITAL_COMMUNITY)
Admission: EM | Admit: 2022-07-15 | Discharge: 2022-07-15 | Disposition: A | Discharge status: Home / Self Care | Payer: Self-pay | Attending: Emergency Medicine | Admitting: Emergency Medicine

## 2022-07-15 ENCOUNTER — Encounter (HOSPITAL_COMMUNITY): Payer: Self-pay

## 2022-07-15 ENCOUNTER — Emergency Department (HOSPITAL_COMMUNITY): Payer: Self-pay

## 2022-07-15 DIAGNOSIS — Y906 Blood alcohol level of 120-199 mg/100 ml: Secondary | ICD-10-CM | POA: Insufficient documentation

## 2022-07-15 DIAGNOSIS — S0083XA Contusion of other part of head, initial encounter: Secondary | ICD-10-CM

## 2022-07-15 DIAGNOSIS — F1510 Other stimulant abuse, uncomplicated: Secondary | ICD-10-CM | POA: Insufficient documentation

## 2022-07-15 DIAGNOSIS — F1020 Alcohol dependence, uncomplicated: Secondary | ICD-10-CM | POA: Insufficient documentation

## 2022-07-15 DIAGNOSIS — Y92511 Restaurant or cafe as the place of occurrence of the external cause: Secondary | ICD-10-CM | POA: Insufficient documentation

## 2022-07-15 DIAGNOSIS — F1914 Other psychoactive substance abuse with psychoactive substance-induced mood disorder: Secondary | ICD-10-CM | POA: Insufficient documentation

## 2022-07-15 DIAGNOSIS — Z23 Encounter for immunization: Secondary | ICD-10-CM | POA: Insufficient documentation

## 2022-07-15 DIAGNOSIS — F10920 Alcohol use, unspecified with intoxication, uncomplicated: Secondary | ICD-10-CM

## 2022-07-15 DIAGNOSIS — F1994 Other psychoactive substance use, unspecified with psychoactive substance-induced mood disorder: Secondary | ICD-10-CM

## 2022-07-15 DIAGNOSIS — S0181XA Laceration without foreign body of other part of head, initial encounter: Secondary | ICD-10-CM | POA: Insufficient documentation

## 2022-07-15 HISTORY — DX: Alcohol dependence, uncomplicated: F10.20

## 2022-07-15 HISTORY — DX: Adverse effect of unspecified drugs, medicaments and biological substances, initial encounter: T50.905A

## 2022-07-15 HISTORY — DX: Unspecified convulsions: R56.9

## 2022-07-15 LAB — RAPID URINE DRUG SCREEN, HOSP PERFORMED
Amphetamines: POSITIVE — AB
Barbiturates: NOT DETECTED
Benzodiazepines: NOT DETECTED
Cocaine: NOT DETECTED
Opiates: NOT DETECTED
Tetrahydrocannabinol: POSITIVE — AB

## 2022-07-15 LAB — CBC WITH DIFFERENTIAL/PLATELET
Abs Immature Granulocytes: 0.04 10*3/uL (ref 0.00–0.07)
Basophils Absolute: 0.1 10*3/uL (ref 0.0–0.1)
Basophils Relative: 1 %
Eosinophils Absolute: 0.1 10*3/uL (ref 0.0–0.5)
Eosinophils Relative: 1 %
HCT: 40.8 % (ref 39.0–52.0)
Hemoglobin: 14.2 g/dL (ref 13.0–17.0)
Immature Granulocytes: 0 %
Lymphocytes Relative: 25 %
Lymphs Abs: 2.5 10*3/uL (ref 0.7–4.0)
MCH: 33.1 pg (ref 26.0–34.0)
MCHC: 34.8 g/dL (ref 30.0–36.0)
MCV: 95.1 fL (ref 80.0–100.0)
Monocytes Absolute: 1.8 10*3/uL — ABNORMAL HIGH (ref 0.1–1.0)
Monocytes Relative: 18 %
Neutro Abs: 5.5 10*3/uL (ref 1.7–7.7)
Neutrophils Relative %: 55 %
Platelets: 428 10*3/uL — ABNORMAL HIGH (ref 150–400)
RBC: 4.29 MIL/uL (ref 4.22–5.81)
RDW: 13.5 % (ref 11.5–15.5)
WBC: 10 10*3/uL (ref 4.0–10.5)
nRBC: 0 % (ref 0.0–0.2)

## 2022-07-15 LAB — COMPREHENSIVE METABOLIC PANEL
ALT: 55 U/L — ABNORMAL HIGH (ref 0–44)
AST: 58 U/L — ABNORMAL HIGH (ref 15–41)
Albumin: 3.3 g/dL — ABNORMAL LOW (ref 3.5–5.0)
Alkaline Phosphatase: 58 U/L (ref 38–126)
Anion gap: 13 (ref 5–15)
BUN: 7 mg/dL (ref 6–20)
CO2: 17 mmol/L — ABNORMAL LOW (ref 22–32)
Calcium: 8.3 mg/dL — ABNORMAL LOW (ref 8.9–10.3)
Chloride: 104 mmol/L (ref 98–111)
Creatinine, Ser: 0.84 mg/dL (ref 0.61–1.24)
GFR, Estimated: >60 mL/min (ref >60)
Glucose, Bld: 94 mg/dL (ref 70–99)
Potassium: 3.5 mmol/L (ref 3.5–5.1)
Sodium: 134 mmol/L — ABNORMAL LOW (ref 135–145)
Total Bilirubin: 0.6 mg/dL (ref 0.3–1.2)
Total Protein: 8.8 g/dL — ABNORMAL HIGH (ref 6.5–8.1)

## 2022-07-15 LAB — SALICYLATE LEVEL: Salicylate Lvl: <7 mg/dL — ABNORMAL LOW (ref 7.0–30.0)

## 2022-07-15 LAB — ETHANOL: Alcohol, Ethyl (B): 160 mg/dL — ABNORMAL HIGH (ref <10)

## 2022-07-15 LAB — ACETAMINOPHEN LEVEL: Acetaminophen (Tylenol), Serum: <10 ug/mL — ABNORMAL LOW (ref 10–30)

## 2022-07-15 MED ORDER — THIAMINE MONONITRATE 100 MG PO TABS
100.0000 mg | ORAL_TABLET | Freq: Every day | ORAL | Status: DC
  Administered 2022-07-15: 100 mg via ORAL
  Filled 2022-07-15: qty 1

## 2022-07-15 MED ORDER — FOLIC ACID 1 MG PO TABS
1.0000 mg | ORAL_TABLET | Freq: Every day | ORAL | Status: DC
  Administered 2022-07-15: 1 mg via ORAL
  Filled 2022-07-15: qty 1

## 2022-07-15 MED ORDER — LORAZEPAM 1 MG PO TABS
1.0000 mg | ORAL_TABLET | ORAL | Status: DC | PRN
  Administered 2022-07-15: 3 mg via ORAL
  Administered 2022-07-15: 2 mg via ORAL
  Administered 2022-07-15: 1 mg via ORAL
  Filled 2022-07-15: qty 3
  Filled 2022-07-15: qty 2
  Filled 2022-07-15: qty 4

## 2022-07-15 MED ORDER — CHLORDIAZEPOXIDE HCL 25 MG PO CAPS
ORAL_CAPSULE | ORAL | 0 refills | Status: AC
  Filled 2022-07-15: qty 6, 3d supply, fill #0

## 2022-07-15 MED ORDER — ADULT MULTIVITAMIN W/MINERALS CH
1.0000 | ORAL_TABLET | Freq: Every day | ORAL | Status: DC
  Administered 2022-07-15: 1 via ORAL
  Filled 2022-07-15: qty 1

## 2022-07-15 MED ORDER — THIAMINE HCL 100 MG/ML IJ SOLN: 100.0000 mg | Freq: Every day | INTRAMUSCULAR | Status: DC

## 2022-07-15 MED ORDER — CHLORDIAZEPOXIDE HCL 25 MG PO CAPS
25.0000 mg | ORAL_CAPSULE | Freq: Once | ORAL | Status: AC
  Administered 2022-07-15: 25 mg via ORAL
  Filled 2022-07-15: qty 1

## 2022-07-15 MED ORDER — TETANUS-DIPHTH-ACELL PERTUSSIS 5-2.5-18.5 LF-MCG/0.5 IM SUSY
0.5000 mL | PREFILLED_SYRINGE | Freq: Once | INTRAMUSCULAR | Status: AC
  Administered 2022-07-15: 0.5 mL via INTRAMUSCULAR
  Filled 2022-07-15: qty 0.5

## 2022-07-15 MED ORDER — LORAZEPAM 1 MG PO TABS: 1.0000 mg | ORAL_TABLET | ORAL | Status: DC | PRN

## 2022-07-15 MED ORDER — LORAZEPAM 1 MG PO TABS
1.0000 mg | ORAL_TABLET | Freq: Once | ORAL | Status: AC
  Administered 2022-07-15: 1 mg via ORAL
  Filled 2022-07-15: qty 1
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
## 2022-07-15 NOTE — Progress Notes (Signed)
TOC consulted for SA resources. SA resources attached to AVS. No further TOC needs.

## 2022-07-15 NOTE — Discharge Instructions (Addendum)
It was our pleasure to provide your ER care today - we hope that you feel better.  Avoid drug and alcohol use as it is harmful to your physical health and mental well-being.  Take librium as need, as prescribed, to help with withdrawal symptoms - no driving when taking. See resource guide attached in terms of accessing inpatient or outpatient substance use treatment programs - follow up closely there in the next day.   Also follow up closely with primary care doctor and behavioral health provider in the coming week. Have your blood pressure rechecked at primary care follow up as it is high today.   Should you experience acute mental health issues and/or crisis, you may also go directly to the [facility] - they are open 24/7 and walk-ins are welcome.    Return to ER if worse, new symptoms, fevers, chest pain, trouble breathing, or other emergency concern.

## 2022-07-15 NOTE — ED Notes (Signed)
Pt is refusing to leave because he states he is not feeling his best and he is still shaking from tremors. Administered additional ativan according to CIWA. Informed him that I will give him a few minutes and get him ready for discharge. Explained to him that he has Librium prescription sent to pharmacy to help with tremors and a referral to [facility] where he can be helped appropriately
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
## 2022-07-17 ENCOUNTER — Emergency Department (HOSPITAL_COMMUNITY): Payer: Self-pay

## 2022-07-17 ENCOUNTER — Emergency Department (HOSPITAL_COMMUNITY)
Admission: EM | Admit: 2022-07-17 | Discharge: 2022-07-17 | Disposition: A | Discharge status: Home / Self Care | Payer: Self-pay | Attending: Emergency Medicine | Admitting: Emergency Medicine

## 2022-07-17 ENCOUNTER — Other Ambulatory Visit: Payer: Self-pay

## 2022-07-17 ENCOUNTER — Encounter (HOSPITAL_COMMUNITY): Payer: Self-pay

## 2022-07-17 DIAGNOSIS — S0181XA Laceration without foreign body of other part of head, initial encounter: Secondary | ICD-10-CM | POA: Insufficient documentation

## 2022-07-17 DIAGNOSIS — S0990XA Unspecified injury of head, initial encounter: Secondary | ICD-10-CM

## 2022-07-17 LAB — CBC WITH DIFFERENTIAL/PLATELET
Abs Immature Granulocytes: 0.03 10*3/uL (ref 0.00–0.07)
Basophils Absolute: 0.1 10*3/uL (ref 0.0–0.1)
Basophils Relative: 1 %
Eosinophils Absolute: 0.1 10*3/uL (ref 0.0–0.5)
Eosinophils Relative: 1 %
HCT: 41.1 % (ref 39.0–52.0)
Hemoglobin: 14.6 g/dL (ref 13.0–17.0)
Immature Granulocytes: 0 %
Lymphocytes Relative: 41 %
Lymphs Abs: 3.5 10*3/uL (ref 0.7–4.0)
MCH: 33.6 pg (ref 26.0–34.0)
MCHC: 35.5 g/dL (ref 30.0–36.0)
MCV: 94.5 fL (ref 80.0–100.0)
Monocytes Absolute: 0.8 10*3/uL (ref 0.1–1.0)
Monocytes Relative: 9 %
Neutro Abs: 4.1 10*3/uL (ref 1.7–7.7)
Neutrophils Relative %: 48 %
Platelets: 488 10*3/uL — ABNORMAL HIGH (ref 150–400)
RBC: 4.35 MIL/uL (ref 4.22–5.81)
RDW: 13.3 % (ref 11.5–15.5)
WBC: 8.7 10*3/uL (ref 4.0–10.5)
nRBC: 0 % (ref 0.0–0.2)

## 2022-07-17 LAB — COMPREHENSIVE METABOLIC PANEL
ALT: 43 U/L (ref 0–44)
AST: 45 U/L — ABNORMAL HIGH (ref 15–41)
Albumin: 3.1 g/dL — ABNORMAL LOW (ref 3.5–5.0)
Alkaline Phosphatase: 68 U/L (ref 38–126)
Anion gap: 14 (ref 5–15)
BUN: <5 mg/dL — ABNORMAL LOW (ref 6–20)
CO2: 21 mmol/L — ABNORMAL LOW (ref 22–32)
Calcium: 8.5 mg/dL — ABNORMAL LOW (ref 8.9–10.3)
Chloride: 102 mmol/L (ref 98–111)
Creatinine, Ser: 0.85 mg/dL (ref 0.61–1.24)
GFR, Estimated: >60 mL/min (ref >60)
Glucose, Bld: 98 mg/dL (ref 70–99)
Potassium: 3.3 mmol/L — ABNORMAL LOW (ref 3.5–5.1)
Sodium: 137 mmol/L (ref 135–145)
Total Bilirubin: 0.4 mg/dL (ref 0.3–1.2)
Total Protein: 8.5 g/dL — ABNORMAL HIGH (ref 6.5–8.1)

## 2022-07-17 LAB — ACETAMINOPHEN LEVEL: Acetaminophen (Tylenol), Serum: <10 ug/mL — ABNORMAL LOW (ref 10–30)

## 2022-07-17 LAB — ETHANOL: Alcohol, Ethyl (B): 435 mg/dL (ref <10)

## 2022-07-17 LAB — SALICYLATE LEVEL: Salicylate Lvl: <7 mg/dL — ABNORMAL LOW (ref 7.0–30.0)

## 2022-07-17 MED ORDER — CHLORDIAZEPOXIDE HCL 25 MG PO CAPS
25.0000 mg | ORAL_CAPSULE | Freq: Once | ORAL | Status: AC
  Administered 2022-07-17: 25 mg via ORAL
  Filled 2022-07-17: qty 1
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
## 2022-07-18 ENCOUNTER — Other Ambulatory Visit: Payer: Self-pay

## 2022-07-20 ENCOUNTER — Encounter (HOSPITAL_COMMUNITY): Payer: No Payment, Other | Admitting: Psychiatry

## 2022-07-22 ENCOUNTER — Emergency Department (HOSPITAL_COMMUNITY): Payer: Medicaid Other

## 2022-07-22 ENCOUNTER — Encounter (HOSPITAL_COMMUNITY): Payer: Self-pay | Admitting: *Deleted

## 2022-07-22 ENCOUNTER — Inpatient Hospital Stay (HOSPITAL_COMMUNITY)
Admission: EM | Admit: 2022-07-22 | Discharge: 2022-07-26 | DRG: 897 | Disposition: A | Discharge status: Other Acute Inpatient Hospital | Payer: Medicaid Other | Attending: Internal Medicine | Admitting: Internal Medicine

## 2022-07-22 ENCOUNTER — Other Ambulatory Visit: Payer: Self-pay

## 2022-07-22 DIAGNOSIS — F1210 Cannabis abuse, uncomplicated: Secondary | ICD-10-CM | POA: Diagnosis present

## 2022-07-22 DIAGNOSIS — F10229 Alcohol dependence with intoxication, unspecified: Secondary | ICD-10-CM | POA: Diagnosis present

## 2022-07-22 DIAGNOSIS — Z91141 Patient's other noncompliance with medication regimen due to financial hardship: Secondary | ICD-10-CM | POA: Diagnosis not present

## 2022-07-22 DIAGNOSIS — R45850 Homicidal ideations: Secondary | ICD-10-CM | POA: Diagnosis present

## 2022-07-22 DIAGNOSIS — Z79899 Other long term (current) drug therapy: Secondary | ICD-10-CM

## 2022-07-22 DIAGNOSIS — F200 Paranoid schizophrenia: Secondary | ICD-10-CM | POA: Diagnosis present

## 2022-07-22 DIAGNOSIS — F1010 Alcohol abuse, uncomplicated: Secondary | ICD-10-CM | POA: Diagnosis not present

## 2022-07-22 DIAGNOSIS — Z880 Allergy status to penicillin: Secondary | ICD-10-CM | POA: Diagnosis not present

## 2022-07-22 DIAGNOSIS — Z833 Family history of diabetes mellitus: Secondary | ICD-10-CM | POA: Diagnosis not present

## 2022-07-22 DIAGNOSIS — E871 Hypo-osmolality and hyponatremia: Secondary | ICD-10-CM | POA: Diagnosis present

## 2022-07-22 DIAGNOSIS — Y907 Blood alcohol level of 200-239 mg/100 ml: Secondary | ICD-10-CM | POA: Diagnosis present

## 2022-07-22 DIAGNOSIS — F10230 Alcohol dependence with withdrawal, uncomplicated: Principal | ICD-10-CM | POA: Diagnosis present

## 2022-07-22 DIAGNOSIS — D72828 Other elevated white blood cell count: Secondary | ICD-10-CM | POA: Diagnosis present

## 2022-07-22 DIAGNOSIS — F332 Major depressive disorder, recurrent severe without psychotic features: Secondary | ICD-10-CM | POA: Diagnosis not present

## 2022-07-22 DIAGNOSIS — F411 Generalized anxiety disorder: Secondary | ICD-10-CM

## 2022-07-22 DIAGNOSIS — Z5900 Homelessness unspecified: Secondary | ICD-10-CM | POA: Diagnosis not present

## 2022-07-22 DIAGNOSIS — Z823 Family history of stroke: Secondary | ICD-10-CM

## 2022-07-22 DIAGNOSIS — F1510 Other stimulant abuse, uncomplicated: Secondary | ICD-10-CM | POA: Diagnosis present

## 2022-07-22 DIAGNOSIS — F319 Bipolar disorder, unspecified: Secondary | ICD-10-CM | POA: Diagnosis present

## 2022-07-22 DIAGNOSIS — F10939 Alcohol use, unspecified with withdrawal, unspecified: Secondary | ICD-10-CM | POA: Diagnosis present

## 2022-07-22 DIAGNOSIS — Z8249 Family history of ischemic heart disease and other diseases of the circulatory system: Secondary | ICD-10-CM

## 2022-07-22 DIAGNOSIS — R54 Age-related physical debility: Secondary | ICD-10-CM | POA: Diagnosis present

## 2022-07-22 DIAGNOSIS — F419 Anxiety disorder, unspecified: Secondary | ICD-10-CM | POA: Diagnosis present

## 2022-07-22 DIAGNOSIS — F1910 Other psychoactive substance abuse, uncomplicated: Secondary | ICD-10-CM | POA: Diagnosis present

## 2022-07-22 DIAGNOSIS — F29 Unspecified psychosis not due to a substance or known physiological condition: Secondary | ICD-10-CM | POA: Diagnosis present

## 2022-07-22 DIAGNOSIS — E860 Dehydration: Secondary | ICD-10-CM | POA: Diagnosis present

## 2022-07-22 DIAGNOSIS — F10931 Alcohol use, unspecified with withdrawal delirium: Secondary | ICD-10-CM

## 2022-07-22 DIAGNOSIS — F4310 Post-traumatic stress disorder, unspecified: Secondary | ICD-10-CM | POA: Diagnosis present

## 2022-07-22 DIAGNOSIS — R45851 Suicidal ideations: Secondary | ICD-10-CM | POA: Diagnosis present

## 2022-07-22 DIAGNOSIS — F32A Depression, unspecified: Secondary | ICD-10-CM | POA: Diagnosis present

## 2022-07-22 DIAGNOSIS — E876 Hypokalemia: Secondary | ICD-10-CM | POA: Diagnosis present

## 2022-07-22 DIAGNOSIS — Z9151 Personal history of suicidal behavior: Secondary | ICD-10-CM

## 2022-07-22 DIAGNOSIS — R5381 Other malaise: Secondary | ICD-10-CM | POA: Diagnosis present

## 2022-07-22 DIAGNOSIS — F17210 Nicotine dependence, cigarettes, uncomplicated: Secondary | ICD-10-CM | POA: Diagnosis present

## 2022-07-22 DIAGNOSIS — F10930 Alcohol use, unspecified with withdrawal, uncomplicated: Principal | ICD-10-CM

## 2022-07-22 DIAGNOSIS — G2401 Drug induced subacute dyskinesia: Secondary | ICD-10-CM

## 2022-07-22 LAB — COMPREHENSIVE METABOLIC PANEL
ALT: 37 U/L (ref 0–44)
AST: 57 U/L — ABNORMAL HIGH (ref 15–41)
Albumin: 3.3 g/dL — ABNORMAL LOW (ref 3.5–5.0)
Alkaline Phosphatase: 60 U/L (ref 38–126)
Anion gap: 15 (ref 5–15)
BUN: 5 mg/dL — ABNORMAL LOW (ref 6–20)
CO2: 20 mmol/L — ABNORMAL LOW (ref 22–32)
Calcium: 8.1 mg/dL — ABNORMAL LOW (ref 8.9–10.3)
Chloride: 93 mmol/L — ABNORMAL LOW (ref 98–111)
Creatinine, Ser: 0.95 mg/dL (ref 0.61–1.24)
GFR, Estimated: >60 mL/min (ref >60)
Glucose, Bld: 131 mg/dL — ABNORMAL HIGH (ref 70–99)
Potassium: 3.4 mmol/L — ABNORMAL LOW (ref 3.5–5.1)
Sodium: 128 mmol/L — ABNORMAL LOW (ref 135–145)
Total Bilirubin: 0.7 mg/dL (ref 0.3–1.2)
Total Protein: 8.8 g/dL — ABNORMAL HIGH (ref 6.5–8.1)

## 2022-07-22 LAB — CBC
HCT: 44.7 % (ref 39.0–52.0)
Hemoglobin: 15.2 g/dL (ref 13.0–17.0)
MCH: 32.5 pg (ref 26.0–34.0)
MCHC: 34 g/dL (ref 30.0–36.0)
MCV: 95.7 fL (ref 80.0–100.0)
Platelets: 314 10*3/uL (ref 150–400)
RBC: 4.67 MIL/uL (ref 4.22–5.81)
RDW: 14.4 % (ref 11.5–15.5)
WBC: 15.1 10*3/uL — ABNORMAL HIGH (ref 4.0–10.5)
nRBC: 0 % (ref 0.0–0.2)

## 2022-07-22 LAB — MAGNESIUM: Magnesium: 1.3 mg/dL — ABNORMAL LOW (ref 1.7–2.4)

## 2022-07-22 LAB — ETHANOL: Alcohol, Ethyl (B): 206 mg/dL — ABNORMAL HIGH (ref <10)

## 2022-07-22 LAB — PHOSPHORUS: Phosphorus: 1.8 mg/dL — ABNORMAL LOW (ref 2.5–4.6)

## 2022-07-22 MED ORDER — LORAZEPAM 1 MG PO TABS: 1.0000 mg | ORAL_TABLET | ORAL | Status: DC | PRN

## 2022-07-22 MED ORDER — THIAMINE HCL 100 MG/ML IJ SOLN
100.0000 mg | Freq: Every day | INTRAMUSCULAR | Status: DC
  Administered 2022-07-22 – 2022-07-23 (×2): 100 mg via INTRAVENOUS
  Filled 2022-07-22 (×3): qty 2

## 2022-07-22 MED ORDER — LORAZEPAM 2 MG/ML IJ SOLN
1.0000 mg | INTRAMUSCULAR | Status: DC | PRN
  Administered 2022-07-22: 4 mg via INTRAVENOUS
  Administered 2022-07-22 – 2022-07-23 (×3): 2 mg via INTRAVENOUS
  Administered 2022-07-23: 3 mg via INTRAVENOUS
  Administered 2022-07-23: 1 mg via INTRAVENOUS
  Administered 2022-07-23: 2 mg via INTRAVENOUS
  Administered 2022-07-23: 3 mg via INTRAVENOUS
  Administered 2022-07-23: 4 mg via INTRAVENOUS
  Administered 2022-07-24 (×3): 3 mg via INTRAVENOUS
  Filled 2022-07-22 (×3): qty 1
  Filled 2022-07-22: qty 2
  Filled 2022-07-22: qty 1
  Filled 2022-07-22 (×2): qty 2
  Filled 2022-07-22: qty 1
  Filled 2022-07-22: qty 2
  Filled 2022-07-22: qty 1
  Filled 2022-07-22 (×2): qty 2

## 2022-07-22 MED ORDER — ADULT MULTIVITAMIN W/MINERALS CH
1.0000 | ORAL_TABLET | Freq: Every day | ORAL | Status: DC
  Administered 2022-07-23 – 2022-07-26 (×4): 1 via ORAL
  Filled 2022-07-22 (×4): qty 1

## 2022-07-22 MED ORDER — MAGNESIUM OXIDE -MG SUPPLEMENT 400 (240 MG) MG PO TABS
400.0000 mg | ORAL_TABLET | Freq: Once | ORAL | Status: DC
  Filled 2022-07-22: qty 1

## 2022-07-22 MED ORDER — ONDANSETRON HCL 4 MG/2ML IJ SOLN
4.0000 mg | Freq: Once | INTRAMUSCULAR | Status: AC
  Administered 2022-07-22: 4 mg via INTRAVENOUS
  Filled 2022-07-22: qty 2

## 2022-07-22 MED ORDER — THIAMINE MONONITRATE 100 MG PO TABS
100.0000 mg | ORAL_TABLET | Freq: Every day | ORAL | Status: DC
  Administered 2022-07-24 – 2022-07-26 (×3): 100 mg via ORAL
  Filled 2022-07-22 (×3): qty 1

## 2022-07-22 MED ORDER — MAGNESIUM SULFATE 2 GM/50ML IV SOLN
2.0000 g | Freq: Once | INTRAVENOUS | Status: AC
  Administered 2022-07-22: 2 g via INTRAVENOUS
  Filled 2022-07-22: qty 50

## 2022-07-22 MED ORDER — LORAZEPAM 1 MG PO TABS
1.0000 mg | ORAL_TABLET | ORAL | Status: DC | PRN
  Administered 2022-07-23: 2 mg via ORAL
  Administered 2022-07-23: 3 mg via ORAL
  Administered 2022-07-23: 1 mg via ORAL
  Filled 2022-07-22: qty 2
  Filled 2022-07-22: qty 3
  Filled 2022-07-22: qty 1

## 2022-07-22 MED ORDER — FOLIC ACID 1 MG PO TABS
1.0000 mg | ORAL_TABLET | Freq: Every day | ORAL | Status: DC
  Administered 2022-07-23 – 2022-07-26 (×4): 1 mg via ORAL
  Filled 2022-07-22 (×4): qty 1

## 2022-07-22 MED ORDER — LACTATED RINGERS IV BOLUS
1000.0000 mL | Freq: Once | INTRAVENOUS | Status: AC
  Administered 2022-07-22: 1000 mL via INTRAVENOUS

## 2022-07-22 NOTE — ED Notes (Signed)
ED TO INPATIENT HANDOFF REPORT  ED Nurse Name and Phone #:  Edyth Gunnels RN [phone]  S Name/Age/Gender Jerry [name] 47 y.o. male Room/Bed: WA10/WA10  Code Status   Code Status: Full Code  Home/SNF/Other Home Patient oriented to: self, place, time, and situation Is this baseline? Yes   Triage Complete: Triage complete  Chief Complaint Alcohol withdrawal (HCC) [F10.939]  Triage Note Pt from home with EMS for alcohol withdrawal and suicidal ideations. Previous withdrawal attempt leading to seizures. Suicidal ideation for the past month after separation with wife with plan to run into traffic. Has not been taking any of his home medications. Vomiting in triage   EMS vitals 140/100, pulse 116, spo2 98% CBG 186   Allergies Allergies  Allergen Reactions   Penicillins Swelling    Reaction occurred in childhood    Level of Care/Admitting Diagnosis ED Disposition     ED Disposition  Admit   Condition  --   Comment  Hospital Area: [facility] [[id number]]  Level of Care: Stepdown [14]  Admit to SDU based on following criteria: Hemodynamic compromise or significant risk of instability:  Patient requiring short term acute titration and management of vasoactive drips, and invasive monitoring (i.e., CVP and Arterial line).  May admit patient to [facility] or [facility] if equivalent level of care is available:: Yes  Covid Evaluation: Asymptomatic - no recent exposure (last 10 days) testing not required  Diagnosis: Alcohol withdrawal (HCC) [291.81.ICD-9-CM]  Admitting Physician: Rometta Emery [[id number]]  Attending Physician: Rometta Emery [[id number]]  Certification:: I certify this patient will need inpatient services for at least 2 midnights  Estimated Length of Stay: 4          B Medical/Surgery History Past Medical History:  Diagnosis Date   Anxiety    Bipolar 1 disorder (HCC)    Depression    Drug-induced seizure (HCC)    EtOH dependence (HCC)     Hallucination    Paranoid schizophrenia (HCC)    since pt's 20's   PTSD (post-traumatic stress disorder)    Past Surgical History:  Procedure Laterality Date   KIDNEY DONATION Right 2003     A IV Location/Drains/Wounds Patient Lines/Drains/Airways Status     Active Line/Drains/Airways     Name Placement date Placement time Site Days   Peripheral IV 07/22/22 20 G Anterior;Left;Proximal Forearm 07/22/22  2119  Forearm  less than 1            Intake/Output Last 24 hours  Intake/Output Summary (Last 24 hours) at 07/22/2022 2324 Last data filed at 07/22/2022 2249 Gross per 24 hour  Intake 1000 ml  Output --  Net 1000 ml    Labs/Imaging Results for orders placed or performed during the hospital encounter of 07/22/22 (from the past 48 hour(s))  Comprehensive metabolic panel     Status: Abnormal   Collection Time: 07/22/22  9:08 PM  Result Value Ref Range   Sodium 128 (L) 135 - 145 mmol/L   Potassium 3.4 (L) 3.5 - 5.1 mmol/L   Chloride 93 (L) 98 - 111 mmol/L   CO2 20 (L) 22 - 32 mmol/L   Glucose, Bld 131 (H) 70 - 99 mg/dL    Comment: Glucose reference range applies only to samples taken after fasting for at least 8 hours.   BUN 5 (L) 6 - 20 mg/dL   Creatinine, Ser 1.47 0.61 - 1.24 mg/dL   Calcium 8.1 (L) 8.9 - 10.3 mg/dL   Total  Protein 8.8 (H) 6.5 - 8.1 g/dL   Albumin 3.3 (L) 3.5 - 5.0 g/dL   AST 57 (H) 15 - 41 U/L   ALT 37 0 - 44 U/L   Alkaline Phosphatase 60 38 - 126 U/L   Total Bilirubin 0.7 0.3 - 1.2 mg/dL   GFR, Estimated >16 >10 mL/min    Comment: (NOTE) Calculated using the CKD-EPI Creatinine Equation (2021)    Anion gap 15 5 - 15    Comment: Performed at [facility], [address], [place], [place] [address]  Magnesium     Status: Abnormal   Collection Time: 07/22/22  9:08 PM  Result Value Ref Range   Magnesium 1.3 (L) 1.7 - 2.4 mg/dL    Comment: Performed at [facility], [address] [place], [place] [address]   Phosphorus     Status: Abnormal   Collection Time: 07/22/22  9:08 PM  Result Value Ref Range   Phosphorus 1.8 (L) 2.5 - 4.6 mg/dL    Comment: Performed at [facility], [address], [place], [place] [address]  CBC     Status: Abnormal   Collection Time: 07/22/22  9:08 PM  Result Value Ref Range   WBC 15.1 (H) 4.0 - 10.5 K/uL   RBC 4.67 4.22 - 5.81 MIL/uL   Hemoglobin 15.2 13.0 - 17.0 g/dL   HCT 82.9 56.2 - 13.0 %   MCV 95.7 80.0 - 100.0 fL   MCH 32.5 26.0 - 34.0 pg   MCHC 34.0 30.0 - 36.0 g/dL   RDW 86.5 78.4 - 69.6 %   Platelets 314 150 - 400 K/uL   nRBC 0.0 0.0 - 0.2 %    Comment: Performed at [facility], [address] [place], [place] [address]  Ethanol     Status: Abnormal   Collection Time: 07/22/22  9:08 PM  Result Value Ref Range   Alcohol, Ethyl (B) 206 (H) <10 mg/dL    Comment: (NOTE) Lowest detectable limit for serum alcohol is 10 mg/dL.  For medical purposes only. Performed at [facility], [address] [place], [place] [address]    DG Chest Portable 1 View  Result Date: 07/22/2022 CLINICAL DATA:  Tachycardia EXAM: PORTABLE CHEST 1 VIEW COMPARISON:  07/16/2021 FINDINGS: Heart and mediastinal contours are within normal limits. No focal opacities or effusions. No acute bony abnormality. IMPRESSION: No active disease. Electronically Signed   By: Charlett Nose M.D.   On: 07/22/2022 22:02    Pending Labs Unresulted Labs (From admission, onward)     Start     Ordered   07/22/22 2209  Rapid urine drug screen (hospital performed)  ONCE - STAT,   STAT        07/22/22 2208   Signed and Held  HIV Antibody (routine testing w rflx)  (HIV Antibody (Routine testing w reflex) panel)  Once,   R        Signed and Held   Signed and Held  CBC  (enoxaparin (LOVENOX)    CrCl >/= 30 ml/min)  Once,   R       Comments: Baseline for enoxaparin therapy IF NOT ALREADY DRAWN.  Notify MD if PLT < 100 K.    Signed and Held    Signed and Held  Creatinine, serum  (enoxaparin (LOVENOX)    CrCl >/= 30 ml/min)  Once,   R       Comments: Baseline for enoxaparin therapy  IF NOT ALREADY DRAWN.    Signed and Held   Signed and Held  Creatinine, serum  (enoxaparin (LOVENOX)    CrCl >/= 30 ml/min)  Weekly,   R     Comments: while on enoxaparin therapy    Signed and Held   Signed and Held  Comprehensive metabolic panel  Tomorrow morning,   R        Signed and Held   Signed and Held  CBC  Tomorrow morning,   R        Signed and Held            Vitals/Pain Today's Vitals   07/22/22 2207 07/22/22 2215 07/22/22 2230 07/22/22 2245  BP:  127/87 123/78 120/78  Pulse: (!) 119 (!) 110 (!) 108 (!) 108  Resp:  (!) 32 (!) 24 (!) 33  Temp:      TempSrc:      SpO2:  93%  95%  PainSc:        Isolation Precautions No active isolations  Medications Medications  thiamine (VITAMIN B1) tablet 100 mg ( Oral See Alternative 07/22/22 2205)    Or  thiamine (VITAMIN B1) injection 100 mg (100 mg Intravenous Given 07/22/22 2205)  folic acid (FOLVITE) tablet 1 mg (has no administration in time range)  multivitamin with minerals tablet 1 tablet (has no administration in time range)  LORazepam (ATIVAN) tablet 1-4 mg ( Oral See Alternative 07/22/22 2211)    Or  LORazepam (ATIVAN) injection 1-4 mg (4 mg Intravenous Given 07/22/22 2211)  magnesium oxide (MAG-OX) tablet 400 mg (has no administration in time range)  lactated ringers bolus 1,000 mL (0 mLs Intravenous Stopped 07/22/22 2249)  ondansetron (ZOFRAN) injection 4 mg (4 mg Intravenous Given 07/22/22 2124)  magnesium sulfate IVPB 2 g 50 mL (2 g Intravenous New Bag/Given 07/22/22 2206)    Mobility walks     Focused Assessments Cardiac Assessment Handoff:    No results found for: "CKTOTAL", "CKMB", "CKMBINDEX", "TROPONINI" No results found for: "DDIMER" Does the Patient currently have chest pain? No    R Recommendations: See Admitting Provider Note  Report given to:    Additional Notes:

## 2022-07-22 NOTE — ED Provider Notes (Signed)
[place] EMERGENCY DEPARTMENT AT [facility] - Jerry Provider Note   CSN: [id number] Arrival date & time: 07/22/22  2044     History  Chief Complaint  Patient presents with   Alcohol Intoxication    Jerry [name] is a 47 y.o. male.   Alcohol Intoxication     47 year old male with medical history significant for bipolar disorder, anxiety, depression, PTSD, paranoid schizophrenia, alcohol abuse and dependence who presents to the emergency department with concern for alcohol withdrawal.  The patient states that his last drink was at noon earlier today.  He endorses suicidal ideation.  He states that he would run out into traffic.  He has not been taking any home medications.  He endorses nausea and vomiting, symptoms of withdrawal from alcohol.  He states that he normally drinks 1/5 of hard liquor daily.  He has had previous seizures from withdrawal from alcohol.  He states that he is currently homeless as his wife kicked him out yesterday.  Home Medications Prior to Admission medications   Medication Sig Start Date End Date Taking? Authorizing Provider  busPIRone (BUSPAR) 10 MG tablet Take 1 tablet (10 mg total) by mouth 2 (two) times daily. 07/11/22 08/10/22  Lamar Sprinkles, MD  gabapentin (NEURONTIN) 400 MG capsule Take 1 capsule (400 mg total) by mouth 2 (two) times daily. 07/11/22   Lamar Sprinkles, MD  nicotine (NICODERM CQ - DOSED IN MG/24 HOURS) 21 mg/24hr patch Place 1 patch (21 mg total) onto the skin daily at 6 (six) AM. 07/11/22   Lamar Sprinkles, MD  risperiDONE (RISPERDAL) 0.5 MG tablet Take 1 tablet (0.5 mg total) by mouth 2 (two) times daily. 07/11/22   Lamar Sprinkles, MD  sertraline (ZOLOFT) 50 MG tablet Take 1 tablet (50 mg total) by mouth daily. 07/11/22   Lamar Sprinkles, MD  traZODone (DESYREL) 50 MG tablet Take 1 tablet (50 mg total) by mouth at bedtime as needed for sleep. 07/11/22   Lamar Sprinkles, MD  valbenazine [facility]) 40 MG capsule Take 1 capsule (40 mg  total) by mouth daily. 07/11/22   Lamar Sprinkles, MD      Allergies    Penicillins    Review of Systems   Review of Systems  All other systems reviewed and are negative.   Physical Exam Updated Vital Signs BP 120/78   Pulse (!) 108   Temp 98.2 F (36.8 C) (Oral)   Resp (!) 33   SpO2 95%  Physical Exam Vitals and nursing note reviewed.  Constitutional:      General: He is not in acute distress.    Appearance: He is well-developed.  HENT:     Head: Normocephalic and atraumatic.  Eyes:     Conjunctiva/sclera: Conjunctivae normal.  Cardiovascular:     Rate and Rhythm: Normal rate and regular rhythm.     Heart sounds: No murmur heard. Pulmonary:     Effort: Pulmonary effort is normal. No respiratory distress.     Breath sounds: Normal breath sounds.  Abdominal:     Palpations: Abdomen is soft.     Tenderness: There is no abdominal tenderness.  Musculoskeletal:        General: No swelling.     Cervical back: Neck supple.  Skin:    General: Skin is warm and dry.     Capillary Refill: Capillary refill takes less than 2 seconds.  Neurological:     Mental Status: He is alert.     Cranial Nerves: No cranial nerve deficit.  Sensory: No sensory deficit.     Motor: No weakness.     Coordination: Coordination abnormal.     Comments: Diffusely tremulous, dysmetria  Psychiatric:        Mood and Affect: Mood normal.        Thought Content: Thought content includes suicidal ideation. Thought content includes suicidal plan.     ED Results / Procedures / Treatments   Labs (all labs ordered are listed, but only abnormal results are displayed) Labs Reviewed  COMPREHENSIVE METABOLIC PANEL - Abnormal; Notable for the following components:      Result Value   Sodium 128 (*)    Potassium 3.4 (*)    Chloride 93 (*)    CO2 20 (*)    Glucose, Bld 131 (*)    BUN 5 (*)    Calcium 8.1 (*)    Total Protein 8.8 (*)    Albumin 3.3 (*)    AST 57 (*)    All other components  within normal limits  MAGNESIUM - Abnormal; Notable for the following components:   Magnesium 1.3 (*)    All other components within normal limits  PHOSPHORUS - Abnormal; Notable for the following components:   Phosphorus 1.8 (*)    All other components within normal limits  CBC - Abnormal; Notable for the following components:   WBC 15.1 (*)    All other components within normal limits  ETHANOL - Abnormal; Notable for the following components:   Alcohol, Ethyl (B) 206 (*)    All other components within normal limits  RAPID URINE DRUG SCREEN, HOSP PERFORMED    EKG EKG Interpretation  Date/Time:  Saturday Jul 22 2022 21:00:05 EDT Ventricular Rate:  120 PR Interval:  111 QRS Duration: 95 QT Interval:  331 QTC Calculation: 468 R Axis:   88 Text Interpretation: Sinus tachycardia Atrial premature complex Borderline repolarization abnormality Confirmed by Ernie Avena ([id number]) on 07/22/2022 9:11:15 PM  Radiology DG Chest Portable 1 View  Result Date: 07/22/2022 CLINICAL DATA:  Tachycardia EXAM: PORTABLE CHEST 1 VIEW COMPARISON:  07/16/2021 FINDINGS: Heart and mediastinal contours are within normal limits. No focal opacities or effusions. No acute bony abnormality. IMPRESSION: No active disease. Electronically Signed   By: Charlett Nose M.D.   On: 07/22/2022 22:02    Procedures .Critical Care  Performed by: Ernie Avena, MD Authorized by: Ernie Avena, MD   Critical care provider statement:    Critical care time (minutes):  30   Critical care was time spent personally by me on the following activities:  Development of treatment plan with patient or surrogate, discussions with consultants, evaluation of patient's response to treatment, examination of patient, ordering and review of laboratory studies, ordering and review of radiographic studies, ordering and performing treatments and interventions, pulse oximetry, re-evaluation of patient's condition and review of old charts   Care  discussed with: admitting provider       Medications Ordered in ED Medications  thiamine (VITAMIN B1) tablet 100 mg ( Oral See Alternative 07/22/22 2205)    Or  thiamine (VITAMIN B1) injection 100 mg (100 mg Intravenous Given 07/22/22 2205)  folic acid (FOLVITE) tablet 1 mg (has no administration in time range)  multivitamin with minerals tablet 1 tablet (has no administration in time range)  LORazepam (ATIVAN) tablet 1-4 mg ( Oral See Alternative 07/22/22 2211)    Or  LORazepam (ATIVAN) injection 1-4 mg (4 mg Intravenous Given 07/22/22 2211)  magnesium oxide (MAG-OX) tablet 400 mg (has no administration  in time range)  lactated ringers bolus 1,000 mL (0 mLs Intravenous Stopped 07/22/22 2249)  ondansetron (ZOFRAN) injection 4 mg (4 mg Intravenous Given 07/22/22 2124)  magnesium sulfate IVPB 2 g 50 mL (2 g Intravenous New Bag/Given 07/22/22 2206)    ED Course/ Medical Decision Making/ A&P                             Medical Decision Making Amount and/or Complexity of Data Reviewed Labs: ordered. Radiology: ordered.  Risk OTC drugs. Prescription drug management. Decision regarding hospitalization.     47 year old male with medical history significant for bipolar disorder, anxiety, depression, PTSD, paranoid schizophrenia, alcohol abuse and dependence who presents to the emergency department with concern for alcohol withdrawal.  The patient states that his last drink was at noon earlier today.  He endorses suicidal ideation.  He states that he would run out into traffic.  He has not been taking any home medications.  He endorses nausea and vomiting, symptoms of withdrawal from alcohol.  He states that he normally drinks 1/5 of hard liquor daily.  He has had previous seizures from withdrawal from alcohol.  He states that he is currently homeless as his wife kicked him out yesterday.  On arrival, the patient was afebrile, tachycardic heart rate 117, not tachypneic, hypertensive BP 144/68,  saturating 97% on room air.  Physical exam concerning for patient who appears to be in acute alcohol withdrawal with diffuse tremulousness noted, initial CIWA score of 16, repeat CIWA score notably 23.  IV access was obtained and the patient was administered IV fluid bolus.  His mucous membranes were dry with concern for dehydration.  No seizures noted.  The patient expresses a desire to stop drinking.  He also presents voluntarily for evaluation in the setting of his suicidal ideation with a plan to jump in front of traffic.  No indication for IVC at this time.  Workup initiated and the patient was administered IV fluid bolus, IV Zofran for nausea and vomiting and IV Ativan.  His laboratory evaluation was significant for hypomagnesemia to 1.3, hyponatremia to 128, a nonanion gap acidosis with a bicarbonate of 20, anion gap of 15, hyperglycemia 131, mildly elevated AST to 57, ALT normal at 37, mild hypokalemia to 3.4, nonspecific leukocytosis to 15, hypophosphatemia 1.8 and an elevated alcohol level to 206.  Patient CIWA was greater than 20 after 6mg  of IV Ativan.  I spoke with Dr. Craige Cotta of pulmonary critical care who felt that patient could be admitted to progressive under the hospitalist service.   Spoke with Dr. Mikeal Hawthorne of hospitalist medicine who accepted the patient in admission to progressive.   Final Clinical Impression(s) / ED Diagnoses Final diagnoses:  Alcohol withdrawal syndrome without complication (HCC)  Suicidal ideation  Hypomagnesemia    Rx / DC Orders ED Discharge Orders     [name]         Ernie Avena, MD 07/22/22 2307

## 2022-07-22 NOTE — BH Assessment (Signed)
TTS attempted to see will follow-up with Pt's nurse. 

## 2022-07-22 NOTE — H&P (Signed)
History and Physical    Patient: Jerry [name]:[id number] DOB: Feb 02, 1976 DOA: 07/22/2022 DOS: the patient was seen and examined on 07/22/2022 PCP: Pcp, No  Patient coming from: Home  Chief Complaint:  Chief Complaint  Patient presents with   Alcohol Intoxication   HPI: Jerry [name] is a 47 y.o. male with medical history significant of alcohol abuse, bipolar disorder, schizoaffective disorder, depression, homelessness, who presented to the ER with concern for alcohol withdrawal.  His last drink was at noon today.  Patient apparently endorsed suicidal ideation on arrival.  His game plan was to run out into traffic.  He is on medications but he has not been taking it in the last few days.  He is getting nausea and vomiting.  Patient normally drinks about 195 hard liquor daily.  Has had history of alcohol withdrawal seizures in the past.  At this point his CIWA score was more than 20.  Patient therefore being admitted to the hospital with alcohol withdrawal symptoms.  Review of Systems: As mentioned in the history of present illness. All other systems reviewed and are negative. Past Medical History:  Diagnosis Date   Anxiety    Bipolar 1 disorder (HCC)    Depression    Drug-induced seizure (HCC)    EtOH dependence (HCC)    Hallucination    Paranoid schizophrenia (HCC)    since pt's 20's   PTSD (post-traumatic stress disorder)    Past Surgical History:  Procedure Laterality Date   KIDNEY DONATION Right 2003   Social History:  reports that he has been smoking cigarettes. He has been smoking an average of 2 packs per day. He has never used smokeless tobacco. He reports current alcohol use of about 32.0 standard drinks of alcohol per week. He reports current drug use. Drugs: Cocaine, Heroin, Amphetamines, Methamphetamines, and Marijuana.  Allergies  Allergen Reactions   Penicillins Swelling    Reaction occurred in childhood    Family History  Problem Relation Age of Onset   Heart  disease Mother    Cancer Mother    Heart disease Father    Diabetes Father    Diabetes Maternal Grandmother    Stroke Paternal Grandfather     Prior to Admission medications   Medication Sig Start Date End Date Taking? Authorizing Provider  busPIRone (BUSPAR) 10 MG tablet Take 1 tablet (10 mg total) by mouth 2 (two) times daily. 07/11/22 08/10/22  Lamar Sprinkles, MD  gabapentin (NEURONTIN) 400 MG capsule Take 1 capsule (400 mg total) by mouth 2 (two) times daily. 07/11/22   Lamar Sprinkles, MD  nicotine (NICODERM CQ - DOSED IN MG/24 HOURS) 21 mg/24hr patch Place 1 patch (21 mg total) onto the skin daily at 6 (six) AM. 07/11/22   Lamar Sprinkles, MD  risperiDONE (RISPERDAL) 0.5 MG tablet Take 1 tablet (0.5 mg total) by mouth 2 (two) times daily. 07/11/22   Lamar Sprinkles, MD  sertraline (ZOLOFT) 50 MG tablet Take 1 tablet (50 mg total) by mouth daily. 07/11/22   Lamar Sprinkles, MD  traZODone (DESYREL) 50 MG tablet Take 1 tablet (50 mg total) by mouth at bedtime as needed for sleep. 07/11/22   Lamar Sprinkles, MD  valbenazine [facility]) 40 MG capsule Take 1 capsule (40 mg total) by mouth daily. 07/11/22   Lamar Sprinkles, MD    Physical Exam: Vitals:   07/22/22 2207 07/22/22 2215 07/22/22 2230 07/22/22 2245  BP:  127/87 123/78 120/78  Pulse: (!) 119 (!) 110 (!) 108 Marland Kitchen)  108  Resp:  (!) 32 (!) 24 (!) 33  Temp:      TempSrc:      SpO2:  93%  95%   Constitutional: Tremulous, very anxious NAD, calm, comfortable Eyes: PERRL, lids and conjunctivae normal ENMT: Mucous membranes are moist. Posterior pharynx clear of any exudate or lesions.Normal dentition.  Neck: normal, supple, no masses, no thyromegaly Respiratory: clear to auscultation bilaterally, no wheezing, no crackles. Normal respiratory effort. No accessory muscle use.  Cardiovascular: Sinus tachycardia, no murmurs / rubs / gallops. No extremity edema. 2+ pedal pulses. No carotid bruits.  Abdomen: no tenderness, no masses palpated. No  hepatosplenomegaly. Bowel sounds positive.  Musculoskeletal: Good range of motion, no joint swelling or tenderness, Skin: no rashes, lesions, ulcers. No induration Neurologic: CN 2-12 grossly intact. Sensation intact, DTR normal. Strength 5/5 in all 4.  Psychiatric: Very anxious, reported suicidal ideation  Data Reviewed:  Sinus tachycardia, sodium 128, potassium 3.4, chloride 93, CO2 20 and glucose 131.  BUN is 5 creatinine 0.95 and calcium 8.1.  Phosphorus 1.8 magnesium 1.3 albumin 3.3.  White count 15.1.  Chest x-ray showed no active disease.  Assessment and Plan:  #1 alcohol withdrawal symptoms: Patient will be admitted and initiated on CIWA protocol.  He is also known to have polysubstance abuse previously and patient is on [facility] when seen last week.  Will repeat urine drug screen while in the hospital.  Continue to monitor his response.  #2 suicidal ideation: Will repeat patient's evaluation.  Involuntary commitment with psychiatric consult if still suicidal.  #3 polysubstance abuse: Patient's alcohol level today is 206.  Last week it was as high as 435.  Also other drugs of abuse including amphetamines and THC.  Will need extensive counseling once again.  #4 hyponatremia: Overall looks dehydrated.  Sodium potassium and chloride all down.  We will hydrate with saline.  #5 hypokalemia: Replete potassium  #6 hypomagnesemia: Replete magnesium  #7 leukocytosis: Probably due to demargination.  Follow white count.  #8 bipolar disorder with schizoaffective disorder: Resume all psych medicines.    Advance Care Planning:   Code Status: Prior full code  Consults: None but will need psychiatric consult  Family Communication: No family at bedside  Severity of Illness: The appropriate patient status for this patient is INPATIENT. Inpatient status is judged to be reasonable and necessary in order to provide the required intensity of service to ensure the patient's safety. The patient's  presenting symptoms, physical exam findings, and initial radiographic and laboratory data in the context of their chronic comorbidities is felt to place them at high risk for further clinical deterioration. Furthermore, it is not anticipated that the patient will be medically stable for discharge from the hospital within 2 midnights of admission.   * I certify that at the point of admission it is my clinical judgment that the patient will require inpatient hospital care spanning beyond 2 midnights from the point of admission due to high intensity of service, high risk for further deterioration and high frequency of surveillance required.*  AuthorLonia Blood, MD 07/22/2022 11:11 PM  For on call review [email or web address].

## 2022-07-22 NOTE — ED Triage Notes (Signed)
Pt from home with EMS for alcohol withdrawal and suicidal ideations. Previous withdrawal attempt leading to seizures. Suicidal ideation for the past month after separation with wife with plan to run into traffic. Has not been taking any of his home medications. Vomiting in triage   EMS vitals 140/100, pulse 116, spo2 98% CBG 186

## 2022-07-23 DIAGNOSIS — F29 Unspecified psychosis not due to a substance or known physiological condition: Secondary | ICD-10-CM

## 2022-07-23 LAB — RAPID URINE DRUG SCREEN, HOSP PERFORMED
Amphetamines: POSITIVE — AB
Barbiturates: NOT DETECTED
Benzodiazepines: POSITIVE — AB
Cocaine: NOT DETECTED
Opiates: NOT DETECTED
Tetrahydrocannabinol: POSITIVE — AB

## 2022-07-23 LAB — COMPREHENSIVE METABOLIC PANEL
ALT: 29 U/L (ref 0–44)
AST: 44 U/L — ABNORMAL HIGH (ref 15–41)
Albumin: 2.9 g/dL — ABNORMAL LOW (ref 3.5–5.0)
Alkaline Phosphatase: 50 U/L (ref 38–126)
Anion gap: 12 (ref 5–15)
BUN: <5 mg/dL — ABNORMAL LOW (ref 6–20)
CO2: 23 mmol/L (ref 22–32)
Calcium: 7.8 mg/dL — ABNORMAL LOW (ref 8.9–10.3)
Chloride: 95 mmol/L — ABNORMAL LOW (ref 98–111)
Creatinine, Ser: 0.86 mg/dL (ref 0.61–1.24)
GFR, Estimated: >60 mL/min (ref >60)
Glucose, Bld: 103 mg/dL — ABNORMAL HIGH (ref 70–99)
Potassium: 2.7 mmol/L — CL (ref 3.5–5.1)
Sodium: 130 mmol/L — ABNORMAL LOW (ref 135–145)
Total Bilirubin: 1 mg/dL (ref 0.3–1.2)
Total Protein: 7.5 g/dL (ref 6.5–8.1)

## 2022-07-23 LAB — CBC
HCT: 37.8 % — ABNORMAL LOW (ref 39.0–52.0)
Hemoglobin: 12.9 g/dL — ABNORMAL LOW (ref 13.0–17.0)
MCH: 32.3 pg (ref 26.0–34.0)
MCHC: 34.1 g/dL (ref 30.0–36.0)
MCV: 94.7 fL (ref 80.0–100.0)
Platelets: 228 10*3/uL (ref 150–400)
RBC: 3.99 MIL/uL — ABNORMAL LOW (ref 4.22–5.81)
RDW: 13.9 % (ref 11.5–15.5)
WBC: 10.7 10*3/uL — ABNORMAL HIGH (ref 4.0–10.5)
nRBC: 0 % (ref 0.0–0.2)

## 2022-07-23 LAB — BASIC METABOLIC PANEL
Anion gap: 6 (ref 5–15)
BUN: 6 mg/dL (ref 6–20)
CO2: 26 mmol/L (ref 22–32)
Calcium: 8.1 mg/dL — ABNORMAL LOW (ref 8.9–10.3)
Chloride: 101 mmol/L (ref 98–111)
Creatinine, Ser: 0.87 mg/dL (ref 0.61–1.24)
GFR, Estimated: >60 mL/min (ref >60)
Glucose, Bld: 124 mg/dL — ABNORMAL HIGH (ref 70–99)
Potassium: 3.4 mmol/L — ABNORMAL LOW (ref 3.5–5.1)
Sodium: 133 mmol/L — ABNORMAL LOW (ref 135–145)

## 2022-07-23 LAB — CREATININE, SERUM
Creatinine, Ser: 0.81 mg/dL (ref 0.61–1.24)
GFR, Estimated: >60 mL/min (ref >60)

## 2022-07-23 LAB — MAGNESIUM
Magnesium: 1.7 mg/dL (ref 1.7–2.4)
Magnesium: 1.9 mg/dL (ref 1.7–2.4)

## 2022-07-23 LAB — HIV ANTIBODY (ROUTINE TESTING W REFLEX): HIV Screen 4th Generation wRfx: NONREACTIVE

## 2022-07-23 LAB — PHOSPHORUS: Phosphorus: 3 mg/dL (ref 2.5–4.6)

## 2022-07-23 LAB — MRSA NEXT GEN BY PCR, NASAL: MRSA by PCR Next Gen: DETECTED — AB

## 2022-07-23 MED ORDER — ONDANSETRON HCL 4 MG/2ML IJ SOLN
4.0000 mg | Freq: Four times a day (QID) | INTRAMUSCULAR | Status: DC | PRN
  Administered 2022-07-23 – 2022-07-24 (×2): 4 mg via INTRAVENOUS
  Filled 2022-07-23 (×2): qty 2

## 2022-07-23 MED ORDER — KCL IN DEXTROSE-NACL 40-5-0.9 MEQ/L-%-% IV SOLN
INTRAVENOUS | Status: DC
  Filled 2022-07-23: qty 1000

## 2022-07-23 MED ORDER — GABAPENTIN 400 MG PO CAPS
400.0000 mg | ORAL_CAPSULE | Freq: Two times a day (BID) | ORAL | Status: DC
  Administered 2022-07-23 – 2022-07-26 (×8): 400 mg via ORAL
  Filled 2022-07-23 (×8): qty 1

## 2022-07-23 MED ORDER — TRAZODONE HCL 50 MG PO TABS
50.0000 mg | ORAL_TABLET | Freq: Every evening | ORAL | Status: DC | PRN
  Administered 2022-07-24 – 2022-07-25 (×2): 50 mg via ORAL
  Filled 2022-07-23 (×2): qty 1

## 2022-07-23 MED ORDER — DEXTROSE-NACL 5-0.9 % IV SOLN: INTRAVENOUS | Status: DC

## 2022-07-23 MED ORDER — DOXYCYCLINE HYCLATE 100 MG PO TABS
100.0000 mg | ORAL_TABLET | Freq: Two times a day (BID) | ORAL | Status: DC
  Administered 2022-07-23 – 2022-07-26 (×6): 100 mg via ORAL
  Filled 2022-07-23 (×6): qty 1

## 2022-07-23 MED ORDER — POTASSIUM CHLORIDE 10 MEQ/100ML IV SOLN
10.0000 meq | INTRAVENOUS | Status: AC
  Administered 2022-07-23 (×3): 10 meq via INTRAVENOUS
  Filled 2022-07-23 (×3): qty 100

## 2022-07-23 MED ORDER — SERTRALINE HCL 100 MG PO TABS
50.0000 mg | ORAL_TABLET | Freq: Every day | ORAL | Status: DC
  Administered 2022-07-23 – 2022-07-26 (×4): 50 mg via ORAL
  Filled 2022-07-23 (×4): qty 1

## 2022-07-23 MED ORDER — ENSURE ENLIVE PO LIQD
237.0000 mL | Freq: Three times a day (TID) | ORAL | Status: DC
  Administered 2022-07-23 – 2022-07-26 (×9): 237 mL via ORAL

## 2022-07-23 MED ORDER — ORAL CARE MOUTH RINSE: 15.0000 mL | OROMUCOSAL | Status: DC | PRN

## 2022-07-23 MED ORDER — SACCHAROMYCES BOULARDII 250 MG PO CAPS
250.0000 mg | ORAL_CAPSULE | Freq: Two times a day (BID) | ORAL | Status: DC
  Administered 2022-07-23 – 2022-07-26 (×6): 250 mg via ORAL
  Filled 2022-07-23 (×6): qty 1

## 2022-07-23 MED ORDER — POTASSIUM CHLORIDE CRYS ER 20 MEQ PO TBCR
40.0000 meq | EXTENDED_RELEASE_TABLET | Freq: Once | ORAL | Status: AC
  Administered 2022-07-23: 40 meq via ORAL
  Filled 2022-07-23: qty 2

## 2022-07-23 MED ORDER — RISPERIDONE 1 MG PO TABS
0.5000 mg | ORAL_TABLET | Freq: Two times a day (BID) | ORAL | Status: DC
  Administered 2022-07-23 (×2): 0.5 mg via ORAL
  Filled 2022-07-23 (×2): qty 1

## 2022-07-23 MED ORDER — ACETAMINOPHEN 325 MG PO TABS
650.0000 mg | ORAL_TABLET | Freq: Four times a day (QID) | ORAL | Status: DC | PRN
  Administered 2022-07-23 – 2022-07-26 (×3): 650 mg via ORAL
  Filled 2022-07-23 (×3): qty 2

## 2022-07-23 MED ORDER — SODIUM CHLORIDE 0.9 % IV SOLN: INTRAVENOUS | Status: DC

## 2022-07-23 MED ORDER — ENOXAPARIN SODIUM 40 MG/0.4ML IJ SOSY
40.0000 mg | PREFILLED_SYRINGE | INTRAMUSCULAR | Status: DC
  Administered 2022-07-23 – 2022-07-26 (×4): 40 mg via SUBCUTANEOUS
  Filled 2022-07-23 (×4): qty 0.4

## 2022-07-23 MED ORDER — BUSPIRONE HCL 10 MG PO TABS
10.0000 mg | ORAL_TABLET | Freq: Two times a day (BID) | ORAL | Status: DC
  Administered 2022-07-23 – 2022-07-26 (×8): 10 mg via ORAL
  Filled 2022-07-23 (×8): qty 1

## 2022-07-23 MED ORDER — ARIPIPRAZOLE 2 MG PO TABS
2.0000 mg | ORAL_TABLET | Freq: Every day | ORAL | Status: DC
  Administered 2022-07-24 – 2022-07-25 (×2): 2 mg via ORAL
  Filled 2022-07-23 (×2): qty 1

## 2022-07-23 MED ORDER — POTASSIUM CHLORIDE 10 MEQ/100ML IV SOLN
INTRAVENOUS | Status: AC
  Administered 2022-07-23: 10 meq via INTRAVENOUS
  Filled 2022-07-23: qty 100

## 2022-07-23 MED ORDER — NICOTINE 21 MG/24HR TD PT24
21.0000 mg | MEDICATED_PATCH | Freq: Every day | TRANSDERMAL | Status: DC
  Administered 2022-07-23 – 2022-07-26 (×4): 21 mg via TRANSDERMAL
  Filled 2022-07-23 (×4): qty 1

## 2022-07-23 MED ORDER — CHLORHEXIDINE GLUCONATE CLOTH 2 % EX PADS
6.0000 | MEDICATED_PAD | Freq: Every day | CUTANEOUS | Status: DC
  Administered 2022-07-23 – 2022-07-25 (×3): 6 via TOPICAL

## 2022-07-23 MED ORDER — POTASSIUM PHOSPHATES 15 MMOLE/5ML IV SOLN
30.0000 mmol | Freq: Once | INTRAVENOUS | Status: AC
  Administered 2022-07-23: 30 mmol via INTRAVENOUS
  Filled 2022-07-23: qty 10

## 2022-07-23 MED ORDER — ONDANSETRON HCL 4 MG PO TABS
4.0000 mg | ORAL_TABLET | Freq: Four times a day (QID) | ORAL | Status: DC | PRN
  Administered 2022-07-23 – 2022-07-26 (×3): 4 mg via ORAL
  Filled 2022-07-23 (×3): qty 1

## 2022-07-23 MED ORDER — VALBENAZINE TOSYLATE 40 MG PO CAPS
40.0000 mg | ORAL_CAPSULE | Freq: Every day | ORAL | Status: DC
  Administered 2022-07-23 – 2022-07-26 (×4): 40 mg via ORAL
  Filled 2022-07-23 (×4): qty 1
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
## 2022-07-23 NOTE — Progress Notes (Addendum)
[facility] Progress Note  Patient: Jerry [name]    WUJ:[id number]  DOA: 07/22/2022     Date of Service: the patient was seen and examined on 07/23/2022  Chief Complaint  Patient presents with   Alcohol Intoxication   Brief hospital course: VICKY VALLIS is a 47 y.o. male with medical history significant of alcohol abuse, bipolar disorder, schizoaffective disorder, depression, homelessness, who presented to the ER with concern for alcohol withdrawal.  His last drink was at noon today.  Patient apparently endorsed suicidal ideation on arrival.  His game plan was to run out into traffic.  He is on medications but he has not been taking it in the last few days.  He is getting nausea and vomiting.  Patient normally drinks about 195 hard liquor daily.  Has had history of alcohol withdrawal seizures in the past.  At this point his CIWA score was more than 20.  Patient therefore being admitted to the hospital with alcohol withdrawal symptoms.    Assessment and Plan: # alcohol withdrawal symptoms:  Continue CIWA protocol.  He is also known to have polysubstance abuse previously and patient is on [facility] when seen last week. Follow repeat urine drug screen while in the hospital.   Continue to monitor his response.   # suicidal ideation:  Follow psych consult Patient may need involuntary commitment to stay in the hospital unless cleared by psychiatrist.    # Left arm pustule, no visible drainage Started doxycycline 100 mg p.o. twice daily for 7 days  # polysubstance abuse: Patient's alcohol level was 206 on admission.  Last week it was as high as 435.  Also other drugs of abuse including amphetamines and THC.   extensive counseling done once again.   # hyponatremia: Overall looks dehydrated.  Sodium potassium and chloride all down.  We will hydrate with saline.   # hypokalemia: Replete potassium Monitor electrolytes and replete as needed.  # hypomagnesemia: Replete magnesium   #  leukocytosis: Probably due to demargination.  Follow white count.   # bipolar disorder with schizoaffective disorder: Resume all psych medicines. Follow psych consult for further management  Body mass index is 28.88 kg/m.  Interventions:       Diet: Regular diet DVT Prophylaxis: Subcutaneous Lovenox   Advance goals of care discussion: Full code  Family Communication: family was not present at bedside, at the time of interview.  The pt provided permission to discuss medical plan with the family. Opportunity was given to ask question and all questions were answered satisfactorily.   Disposition:  Pt is from community, homeless, admitted with alcohol withdrawal and suicidal thoughts, still has suicidal thoughts, and electrolyte imbalance, which precludes a safe discharge. Discharge to shelter versus inpatient psych, when medically stable..  Subjective: No significant events overnight, patient was complaining of headache.  Patient still has suicidal thoughts, still having tremors in the bilateral hands, denies any chest pain or palpitation, no shortness of breath.  No any other active issues.  Physical Exam: General: NAD, lying comfortably.  Patient was sleepy and dozing off. Appear in no distress, affect appropriate Eyes: PERRLA ENT: Oral Mucosa Clear, moist  Neck: no JVD,  Cardiovascular: S1 and S2 Present, no Murmur,  Respiratory: good respiratory effort, Bilateral Air entry equal and Decreased, no Crackles, no wheezes Abdomen: Bowel Sound present, Soft and no tenderness,  Skin: no rashes Extremities: no Pedal edema, no calf tenderness Neurologic: without any new focal findings Gait not checked due to patient safety  concerns  Vitals:   07/23/22 0900 07/23/22 1000 07/23/22 1100 07/23/22 1200  BP: 116/87 125/84 106/72   Pulse: (!) 111 95 (!) 110   Resp: (!) 29 (!) 31 (!) 27   Temp:    98.5 F (36.9 C)  TempSrc:    Axillary  SpO2: 92% 95% 93%   Weight:      Height:         Intake/Output Summary (Last 24 hours) at 07/23/2022 1252 Last data filed at 07/23/2022 6045 Gross per 24 hour  Intake 2177.26 ml  Output --  Net 2177.26 ml   Filed Weights   07/23/22 0225  Weight: 88.7 kg    Data Reviewed: I have personally reviewed and interpreted daily labs, tele strips, imagings as discussed above. I reviewed all nursing notes, pharmacy notes, vitals, pertinent old records I have discussed plan of care as described above with RN and patient/family.  CBC: Recent Labs  Lab 07/17/22 1538 07/22/22 2108 07/23/22 0314  WBC 8.7 15.1* 10.7*  NEUTROABS 4.1  --   --   HGB 14.6 15.2 12.9*  HCT 41.1 44.7 37.8*  MCV 94.5 95.7 94.7  PLT 488* 314 228   Basic Metabolic Panel: Recent Labs  Lab 07/17/22 1538 07/22/22 2108 07/23/22 0314 07/23/22 0315 07/23/22 0316  NA 137 128*  --   --  130*  K 3.3* 3.4*  --   --  2.7*  CL 102 93*  --   --  95*  CO2 21* 20*  --   --  23  GLUCOSE 98 131*  --   --  103*  BUN <5* 5*  --   --  <5*  CREATININE 0.85 0.95 0.81  --  0.86  CALCIUM 8.5* 8.1*  --   --  7.8*  MG  --  1.3*  --  1.7  --   PHOS  --  1.8*  --   --   --     Studies: DG Chest Portable 1 View  Result Date: 07/22/2022 CLINICAL DATA:  Tachycardia EXAM: PORTABLE CHEST 1 VIEW COMPARISON:  07/16/2021 FINDINGS: Heart and mediastinal contours are within normal limits. No focal opacities or effusions. No acute bony abnormality. IMPRESSION: No active disease. Electronically Signed   By: Charlett Nose M.D.   On: 07/22/2022 22:02    Scheduled Meds:  busPIRone  10 mg Oral BID   Chlorhexidine Gluconate Cloth  6 each Topical Daily   enoxaparin (LOVENOX) injection  40 mg Subcutaneous Q24H   feeding supplement  237 mL Oral TID BM   folic acid  1 mg Oral Daily   gabapentin  400 mg Oral BID   magnesium oxide  400 mg Oral Once   multivitamin with minerals  1 tablet Oral Daily   nicotine  21 mg Transdermal Q0600   risperiDONE  0.5 mg Oral BID   sertraline  50 mg Oral Daily    thiamine  100 mg Oral Daily   Or   thiamine  100 mg Intravenous Daily   valbenazine  40 mg Oral Daily   Continuous Infusions:  dextrose 5 % and 0.9% NaCl 125 mL/hr at 07/23/22 0558   potassium PHOSPHATE IVPB (in mmol) 30 mmol (07/23/22 0918)   PRN Meds: acetaminophen, LORazepam **OR** LORazepam, ondansetron **OR** ondansetron (ZOFRAN) IV, mouth rinse, traZODone  Time spent: 55 minutes  Author: Gillis Santa. MD [facility] 07/23/2022 12:52 PM  To reach On-call, see care teams to locate the attending and reach out to  them via [email or web address]. If 7PM-7AM, please contact night-coverage If you still have difficulty reaching the attending provider, please page the [facility] (Director on Call) for [facility] on amion for assistance.

## 2022-07-24 ENCOUNTER — Other Ambulatory Visit: Payer: Self-pay

## 2022-07-24 LAB — BASIC METABOLIC PANEL
Anion gap: 7 (ref 5–15)
BUN: 5 mg/dL — ABNORMAL LOW (ref 6–20)
CO2: 25 mmol/L (ref 22–32)
Calcium: 8 mg/dL — ABNORMAL LOW (ref 8.9–10.3)
Chloride: 101 mmol/L (ref 98–111)
Creatinine, Ser: 0.9 mg/dL (ref 0.61–1.24)
GFR, Estimated: >60 mL/min (ref >60)
Glucose, Bld: 123 mg/dL — ABNORMAL HIGH (ref 70–99)
Potassium: 3.5 mmol/L (ref 3.5–5.1)
Sodium: 133 mmol/L — ABNORMAL LOW (ref 135–145)

## 2022-07-24 LAB — CBC
HCT: 37.3 % — ABNORMAL LOW (ref 39.0–52.0)
Hemoglobin: 12.3 g/dL — ABNORMAL LOW (ref 13.0–17.0)
MCH: 32.5 pg (ref 26.0–34.0)
MCHC: 33 g/dL (ref 30.0–36.0)
MCV: 98.7 fL (ref 80.0–100.0)
Platelets: 147 10*3/uL — ABNORMAL LOW (ref 150–400)
RBC: 3.78 MIL/uL — ABNORMAL LOW (ref 4.22–5.81)
RDW: 14.4 % (ref 11.5–15.5)
WBC: 7 10*3/uL (ref 4.0–10.5)
nRBC: 0 % (ref 0.0–0.2)

## 2022-07-24 LAB — MAGNESIUM: Magnesium: 1.8 mg/dL (ref 1.7–2.4)

## 2022-07-24 LAB — PHOSPHORUS: Phosphorus: 2.7 mg/dL (ref 2.5–4.6)

## 2022-07-24 MED ORDER — PHENOBARBITAL 32.4 MG PO TABS: 64.8000 mg | ORAL_TABLET | Freq: Three times a day (TID) | ORAL | Status: DC

## 2022-07-24 MED ORDER — PHENOBARBITAL 32.4 MG PO TABS: 64.8000 mg | ORAL_TABLET | Freq: Two times a day (BID) | ORAL | Status: DC

## 2022-07-24 MED ORDER — PHENOBARBITAL 32.4 MG PO TABS: 32.4000 mg | ORAL_TABLET | Freq: Three times a day (TID) | ORAL | Status: DC

## 2022-07-24 MED ORDER — PHENOBARBITAL 32.4 MG PO TABS: 97.2000 mg | ORAL_TABLET | Freq: Three times a day (TID) | ORAL | Status: DC

## 2022-07-24 MED ORDER — LORAZEPAM 1 MG PO TABS
1.0000 mg | ORAL_TABLET | ORAL | Status: DC | PRN
  Administered 2022-07-25 (×2): 1 mg via ORAL
  Filled 2022-07-24 (×2): qty 1

## 2022-07-24 MED ORDER — CHLORDIAZEPOXIDE HCL 10 MG PO CAPS
10.0000 mg | ORAL_CAPSULE | Freq: Four times a day (QID) | ORAL | Status: DC
  Administered 2022-07-24 – 2022-07-25 (×5): 10 mg via ORAL
  Filled 2022-07-24 (×5): qty 1

## 2022-07-24 MED ORDER — MUPIROCIN 2 % EX OINT
1.0000 | TOPICAL_OINTMENT | Freq: Two times a day (BID) | CUTANEOUS | Status: DC
  Administered 2022-07-25 – 2022-07-26 (×3): 1 via NASAL
  Filled 2022-07-24: qty 22

## 2022-07-24 NOTE — Progress Notes (Signed)
[facility] Progress Note  Patient: Jerry [name]    ZOX:[id number]  DOA: 07/22/2022     Date of Service: the patient was seen and examined on 07/24/2022  Chief Complaint  Patient presents with   Alcohol Intoxication   Brief hospital course: Jerry [name] is a 47 y.o. male with medical history significant of alcohol abuse, bipolar disorder, schizoaffective disorder, depression, homelessness, who presented to the ER with concern for alcohol withdrawal.  His last drink was at noon today.  Patient apparently endorsed suicidal ideation on arrival.  His game plan was to run out into traffic.  He is on medications but he has not been taking it in the last few days.  He is getting nausea and vomiting.  Patient normally drinks about 1/5 of Vodka daily.  Has had history of alcohol withdrawal seizures in the past. In ER , his CIWA score was more than 20.  Patient therefore being admitted to the hospital with alcohol withdrawal symptoms.  Also followed by psychiatry for suicidal ideations.   Assessment and Plan: # alcohol withdrawal symptoms:  Continue CIWA protocol.  He is also known to have polysubstance abuse previously and patient is on [facility] when seen last week. Patient currently on symptom-based CIWA protocol with IV Ativan and oral Ativan.  Symptoms are well-controlled. Start patient on Librium 10 mg 4 times daily with slow taper in order to reduce need for injectable Ativan. Continue multivitamins. If able to manage without injectable Ativan, will transfer to inpatient psychiatry unit.   # suicidal ideation:  Presented with active suicidal ideation. Currently remains on suicide precautions, one-to-one observation. Followed by psychiatry.  Recommended inpatient psychiatric admission. Medication management with Abilify 2 mg daily BuSpar 10 mg twice daily Gabapentin 4 mg twice daily Zoloft 50 mg daily Ingrezza 40 mg daily Patient is voluntary now, will need IVC if attempts to  leave. Transfer to psych if able to manage symptoms without needing injections.   # Left arm pustule, no visible drainage Started doxycycline 100 mg p.o. twice daily for 7 days  # polysubstance abuse: Patient's alcohol level was 206 on admission.  Last week it was as high as 435.  Also other drugs of abuse including amphetamines and THC.   Counseling.   # hyponatremia: Treated with IV fluids.  Discontinue.  # hypokalemia: Replete potassium.  Keep on scheduled potassium replacement.  # hypomagnesemia: Replete magnesium.  Keep on his schedule replacement.   # leukocytosis: Probably due to demargination.  Improved.   # bipolar disorder with schizoaffective disorder: As above. Body mass index is 28.88 kg/m.  Interventions:       Diet: Regular diet DVT Prophylaxis: Subcutaneous Lovenox   Advance goals of care discussion: Full code  Family Communication: None today.  Disposition:  Treating for alcohol withdrawal symptoms.  Will transfer to psych Next 24 hours if not needing injectable benzodiazepines.   Subjective: Patient seen and examined.  He was tremulous and was given Ativan in the morning.  He tells me he has some tremors but denies any complaints.   Physical Exam: General: NAD, lying comfortably.  Frail.  Some resting tremors present. Alert awake and mostly oriented.  Answering appropriately. Appear in no distress, affect appropriate Eyes: PERRLA Extremities: no Pedal edema, no calf tenderness Neurologic: without any new focal findings Gait not checked due to patient safety concerns  Vitals:   07/24/22 0800 07/24/22 0830 07/24/22 0900 07/24/22 0930  BP:   137/86   Pulse: 82 79 82  87  Resp: (!) 37 18 (!) 33 (!) 29  Temp:    98.4 F (36.9 C)  TempSrc:    Oral  SpO2: 94% 93% 90% 93%  Weight:      Height:        Intake/Output Summary (Last 24 hours) at 07/24/2022 1046 Last data filed at 07/24/2022 0955 Gross per 24 hour  Intake 2375.33 ml  Output 2460 ml   Net -84.67 ml   Filed Weights   07/23/22 0225  Weight: 88.7 kg    Data Reviewed: I have personally reviewed and interpreted daily labs, tele strips, imagings as discussed above. I reviewed all nursing notes, pharmacy notes, vitals, pertinent old records I have discussed plan of care as described above with RN and patient/family.  CBC: Recent Labs  Lab 07/17/22 1538 07/22/22 2108 07/23/22 0314 07/24/22 0254  WBC 8.7 15.1* 10.7* 7.0  NEUTROABS 4.1  --   --   --   HGB 14.6 15.2 12.9* 12.3*  HCT 41.1 44.7 37.8* 37.3*  MCV 94.5 95.7 94.7 98.7  PLT 488* 314 228 147*   Basic Metabolic Panel: Recent Labs  Lab 07/17/22 1538 07/22/22 2108 07/23/22 0314 07/23/22 0315 07/23/22 0316 07/23/22 1815 07/24/22 0254  NA 137 128*  --   --  130* 133* 133*  K 3.3* 3.4*  --   --  2.7* 3.4* 3.5  CL 102 93*  --   --  95* 101 101  CO2 21* 20*  --   --  23 26 25   GLUCOSE 98 131*  --   --  103* 124* 123*  BUN <5* 5*  --   --  <5* 6 5*  CREATININE 0.85 0.95 0.81  --  0.86 0.87 0.90  CALCIUM 8.5* 8.1*  --   --  7.8* 8.1* 8.0*  MG  --  1.3*  --  1.7  --  1.9 1.8  PHOS  --  1.8*  --   --   --  3.0 2.7    Studies: No results found.  Scheduled Meds:  ARIPiprazole  2 mg Oral Daily   busPIRone  10 mg Oral BID   chlordiazePOXIDE  10 mg Oral QID   Chlorhexidine Gluconate Cloth  6 each Topical Daily   doxycycline  100 mg Oral Q12H   enoxaparin (LOVENOX) injection  40 mg Subcutaneous Q24H   feeding supplement  237 mL Oral TID BM   folic acid  1 mg Oral Daily   gabapentin  400 mg Oral BID   magnesium oxide  400 mg Oral Once   multivitamin with minerals  1 tablet Oral Daily   nicotine  21 mg Transdermal Q0600   saccharomyces boulardii  250 mg Oral BID   sertraline  50 mg Oral Daily   thiamine  100 mg Oral Daily   Or   thiamine  100 mg Intravenous Daily   valbenazine  40 mg Oral Daily   Continuous Infusions:   PRN Meds: acetaminophen, LORazepam, ondansetron **OR** ondansetron (ZOFRAN)  IV, mouth rinse, traZODone  Time spent: 40 minutes
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
## 2022-07-24 NOTE — Progress Notes (Signed)
Initial Nutrition Assessment  DOCUMENTATION CODES:   Not applicable  INTERVENTION:  -Continue Ensure Plus High Protein po TID, each supplement provides 350 kcal and 20 grams of protein. -Continue MVI  -Continue regular diet    NUTRITION DIAGNOSIS:   Inadequate oral intake related to nausea as evidenced by meal completion < 50%.   GOAL:   Patient will meet greater than or equal to 90% of their needs   MONITOR:   PO intake, Supplement acceptance, I & O's, Labs, Weight trends  REASON FOR ASSESSMENT:   Malnutrition Screening Tool    ASSESSMENT:   47 y.o. male with PMHx including alcohol abuse, bipolar disorder, schizoaffective disorder, depression, homelessness who presents with alcohol withdrawal symptoms and SI.  Drinks 1/5 of vodka per day  Polysubstance abuse  N/V     Familiar to nutrition services  Labs:  Na 133, Glu 123, hga1c 5.3  Meds: doxycycline, folvite, mag-ox, florastor, thiamine, MVI  Wt: admit wt- 196#  PO: 38% avg meal intake x 2 documented meals  I/O's:  +2.1 L   RD spoke with patient's RN who reports he is eating and feeding himself fine. He has been nauseated and having decreased po intake as a result. Patient had Ensure at bedside and was reported to drinking it.   NUTRITION - FOCUSED PHYSICAL EXAM:  Unable to assess- will re-attempt at follow up   Diet Order:   Diet Order             Diet regular Room service appropriate? Yes; Fluid consistency: Thin  Diet effective now                   EDUCATION NEEDS:   Education needs have been addressed  Skin:  Skin Assessment: Reviewed RN Assessment  Last BM:  5/5  Height:   Ht Readings from Last 1 Encounters:  07/23/22 5\' 9"  (1.753 m)    Weight:   Wt Readings from Last 1 Encounters:  07/23/22 88.7 kg    BMI:  Body mass index is 28.88 kg/m.  Estimated Nutritional Needs:   Kcal:  1800-2200 kcal  Protein:  100-130 g  Fluid:  >/= 2L    Leodis Rains, RDN, LDN   Clinical Nutrition

## 2022-07-25 ENCOUNTER — Other Ambulatory Visit: Payer: Self-pay

## 2022-07-25 LAB — BASIC METABOLIC PANEL
Anion gap: 7 (ref 5–15)
BUN: 8 mg/dL (ref 6–20)
CO2: 26 mmol/L (ref 22–32)
Calcium: 8.8 mg/dL — ABNORMAL LOW (ref 8.9–10.3)
Chloride: 97 mmol/L — ABNORMAL LOW (ref 98–111)
Creatinine, Ser: 0.72 mg/dL (ref 0.61–1.24)
GFR, Estimated: >60 mL/min (ref >60)
Glucose, Bld: 106 mg/dL — ABNORMAL HIGH (ref 70–99)
Potassium: 3.4 mmol/L — ABNORMAL LOW (ref 3.5–5.1)
Sodium: 130 mmol/L — ABNORMAL LOW (ref 135–145)

## 2022-07-25 LAB — CBC
HCT: 36.1 % — ABNORMAL LOW (ref 39.0–52.0)
Hemoglobin: 12.2 g/dL — ABNORMAL LOW (ref 13.0–17.0)
MCH: 33.5 pg (ref 26.0–34.0)
MCHC: 33.8 g/dL (ref 30.0–36.0)
MCV: 99.2 fL (ref 80.0–100.0)
Platelets: 135 10*3/uL — ABNORMAL LOW (ref 150–400)
RBC: 3.64 MIL/uL — ABNORMAL LOW (ref 4.22–5.81)
RDW: 13.7 % (ref 11.5–15.5)
WBC: 7.4 10*3/uL (ref 4.0–10.5)
nRBC: 0 % (ref 0.0–0.2)

## 2022-07-25 LAB — PHOSPHORUS: Phosphorus: 2.7 mg/dL (ref 2.5–4.6)

## 2022-07-25 LAB — MAGNESIUM: Magnesium: 1.5 mg/dL — ABNORMAL LOW (ref 1.7–2.4)

## 2022-07-25 MED ORDER — ADULT MULTIVITAMIN W/MINERALS CH: 1.0000 | ORAL_TABLET | Freq: Every day | ORAL | Status: DC

## 2022-07-25 MED ORDER — POTASSIUM CHLORIDE CRYS ER 20 MEQ PO TBCR
40.0000 meq | EXTENDED_RELEASE_TABLET | Freq: Two times a day (BID) | ORAL | Status: DC
  Administered 2022-07-25 – 2022-07-26 (×3): 40 meq via ORAL
  Filled 2022-07-25 (×3): qty 2

## 2022-07-25 MED ORDER — CHLORDIAZEPOXIDE HCL 25 MG PO CAPS: 25.0000 mg | ORAL_CAPSULE | Freq: Four times a day (QID) | ORAL | 0 refills | Status: DC

## 2022-07-25 MED ORDER — CHLORDIAZEPOXIDE HCL 25 MG PO CAPS: 25.0000 mg | ORAL_CAPSULE | ORAL | Status: DC

## 2022-07-25 MED ORDER — CHLORDIAZEPOXIDE HCL 25 MG PO CAPS: 25.0000 mg | ORAL_CAPSULE | ORAL | 0 refills | Status: DC

## 2022-07-25 MED ORDER — POTASSIUM CHLORIDE CRYS ER 20 MEQ PO TBCR: 40.0000 meq | EXTENDED_RELEASE_TABLET | Freq: Every day | ORAL | Status: DC

## 2022-07-25 MED ORDER — MAGNESIUM 200 MG PO TABS: 400.0000 mg | ORAL_TABLET | Freq: Every day | ORAL | Status: DC

## 2022-07-25 MED ORDER — MAGNESIUM SULFATE 2 GM/50ML IV SOLN
2.0000 g | Freq: Once | INTRAVENOUS | Status: AC
  Administered 2022-07-25: 2 g via INTRAVENOUS
  Filled 2022-07-25: qty 50

## 2022-07-25 MED ORDER — CHLORDIAZEPOXIDE HCL 25 MG PO CAPS
25.0000 mg | ORAL_CAPSULE | Freq: Three times a day (TID) | ORAL | Status: DC
  Administered 2022-07-26: 25 mg via ORAL
  Filled 2022-07-25: qty 1

## 2022-07-25 MED ORDER — VITAMIN B-1 100 MG PO TABS
100.0000 mg | ORAL_TABLET | Freq: Every day | ORAL | 0 refills | Status: AC
  Filled 2022-07-25: qty 30, 30d supply, fill #0

## 2022-07-25 MED ORDER — DOXYCYCLINE HYCLATE 100 MG PO TABS: 100.0000 mg | ORAL_TABLET | Freq: Two times a day (BID) | ORAL | 0 refills | Status: DC

## 2022-07-25 MED ORDER — ARIPIPRAZOLE 5 MG PO TABS
5.0000 mg | ORAL_TABLET | Freq: Every day | ORAL | Status: DC
  Administered 2022-07-26: 5 mg via ORAL
  Filled 2022-07-25: qty 1

## 2022-07-25 MED ORDER — CHLORDIAZEPOXIDE HCL 25 MG PO CAPS: 25.0000 mg | ORAL_CAPSULE | Freq: Three times a day (TID) | ORAL | 0 refills | Status: DC

## 2022-07-25 MED ORDER — ARIPIPRAZOLE 2 MG PO TABS: 2.0000 mg | ORAL_TABLET | Freq: Every day | ORAL | Status: DC

## 2022-07-25 MED ORDER — FOLIC ACID 1 MG PO TABS: 1.0000 mg | ORAL_TABLET | Freq: Every day | ORAL | Status: DC

## 2022-07-25 MED ORDER — CHLORDIAZEPOXIDE HCL 25 MG PO CAPS: 25.0000 mg | ORAL_CAPSULE | Freq: Every day | ORAL | Status: DC

## 2022-07-25 MED ORDER — CHLORDIAZEPOXIDE HCL 25 MG PO CAPS
25.0000 mg | ORAL_CAPSULE | Freq: Four times a day (QID) | ORAL | Status: AC
  Administered 2022-07-25 – 2022-07-26 (×4): 25 mg via ORAL
  Filled 2022-07-25 (×4): qty 1
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
## 2022-07-25 NOTE — TOC Initial Note (Signed)
Transition of Care [facility]) - Initial/Assessment Note    Patient Details  Name: Jerry [name] MRN: [id number] Date of Birth: 10-09-1975  [facility] of [facility] CM/SW Contact:    Lavenia Atlas, RN Phone Number: 07/25/2022, 2:57 PM  Clinical Narrative:                 Received secure chat from NP Fredna Dow, requesting assistance with locating [facility] bed. Patient has been faxed out awaiting response from multiple facilities.   TOC will continue to follow.  Expected Discharge Plan: IP Rehab Facility Barriers to Discharge: Continued Medical Work up   Patient Goals and CMS Choice            Expected Discharge Plan and Services In-house Referral: NA Discharge Planning Services: CM Consult   Living arrangements for the past 2 months: Single Family Home Expected Discharge Date: 07/25/22               DME Arranged: N/A DME Agency: NA       HH Arranged: NA HH Agency: NA        Prior Living Arrangements/Services Living arrangements for the past 2 months: Single Family Home Lives with:: Self                   Activities of Daily Living Home Assistive Devices/Equipment: None ADL Screening (condition at time of admission) Patient's cognitive ability adequate to safely complete daily activities?: Yes Is the patient deaf or have difficulty hearing?: No Does the patient have difficulty seeing, even when wearing glasses/contacts?: No Does the patient have difficulty concentrating, remembering, or making decisions?: No Patient able to express need for assistance with ADLs?: Yes Does the patient have difficulty dressing or bathing?: No Independently performs ADLs?: Yes (appropriate for developmental age) Does the patient have difficulty walking or climbing stairs?: No Weakness of Legs: None Weakness of Arms/Hands: None  Permission Sought/Granted                  Emotional Assessment Appearance:: Appears stated age Attitude/Demeanor/Rapport: Unable to Assess Affect  (typically observed): Unable to Assess   Alcohol / Substance Use: Alcohol Use Psych Involvement: No (comment)  Admission diagnosis:  Alcohol withdrawal (HCC) [F10.939] Suicidal ideation [R45.851] Hypomagnesemia [E83.42] Alcohol withdrawal syndrome without complication (HCC) [F10.930] Patient Active Problem List   Diagnosis Date Noted   Alcohol withdrawal (HCC) 07/22/2022   Schizoaffective disorder, bipolar type (HCC) 07/09/2022   Schizophrenia (HCC) 07/07/2022   Auditory hallucination    Malingering 09/07/2020   Homelessness 09/07/2020   Substance induced mood disorder (HCC) 09/07/2020   Major depressive disorder, recurrent severe without psychotic features (HCC) 07/17/2020   Schizophrenia spectrum disorder with psychotic disorder type not yet determined (HCC) 07/17/2020   Methamphetamine abuse (HCC) 07/17/2020   Marijuana abuse 07/17/2020   Cocaine abuse (HCC) 07/17/2020   Anxiety and depression 06/25/2014   Family history of diabetes mellitus (DM) 06/25/2014   Family history of thyroid disease 06/25/2014   Tobacco abuse 06/25/2014   Alcohol abuse 05/18/2013   Polysubstance abuse (HCC) 05/18/2013   Depression, unspecified 05/18/2013   PCP:  Pcp, No Pharmacy:   [facility] [address] [place] [address] Phone: [phone] Fax: [phone]  [id number] [place], [place] - [address] [place] [address] Phone: [phone] Fax: [phone]     Social Determinants of Health (SDOH) Social History: SDOH Screenings   Food  Insecurity: Food Insecurity Present (07/23/2022)  Housing: High Risk (07/23/2022)  Transportation Needs: Unmet Transportation Needs (07/23/2022)  Utilities: At Risk (07/23/2022)  Alcohol Screen: Medium Risk (10/30/2020)  Depression (PHQ2-9): Low Risk  (07/11/2022)  Recent Concern: Depression (PHQ2-9) - High Risk (07/09/2022)  Tobacco Use:  High Risk (07/22/2022)   SDOH Interventions:     Readmission Risk Interventions     No data to display
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
## 2022-07-25 NOTE — Progress Notes (Signed)
[facility] Progress Note  Patient: Jerry [name]    ZOX:[id number]  DOA: 07/22/2022     Date of Service: the patient was seen and examined on 07/25/2022  Chief Complaint  Patient presents with   Alcohol Intoxication   Brief hospital course: JAIVIN [name] is a 47 y.o. male with medical history significant of alcohol abuse, bipolar disorder, schizoaffective disorder, depression, homelessness, who presented to the ER with concern for alcohol withdrawal.  His last drink was at noon today.  Patient apparently endorsed suicidal ideation on arrival.  His game plan was to run out into traffic.  He is on medications but he has not been taking it in the last few days.  He is getting nausea and vomiting.  Patient normally drinks about 1/5 of Vodka daily.  Has had history of alcohol withdrawal seizures in the past. In ER , his CIWA score was more than 20.  Patient therefore being admitted to the hospital with alcohol withdrawal symptoms.  Also followed by psychiatry for suicidal ideations.   Assessment and Plan: # alcohol withdrawal symptoms:  Continue CIWA protocol.  He is also known to have polysubstance abuse previously and patient is on [facility] Patient currently on Librium 10 mg 4 times daily along with as needed oral Ativan.   Symptoms are well-controlled.   continue multivitamins. Since able to manage without injectable, will discuss with behavioral health if patient can be transferred.   His symptoms are really multifactorial.   # suicidal ideation/bipolar disorder with schizoaffective disorder: Presented with active suicidal ideation. Currently remains on suicide precautions, one-to-one observation. Followed by psychiatry.  Recommended inpatient psychiatric admission. Medication management with Abilify 2 mg daily BuSpar 10 mg twice daily Gabapentin 4 mg twice daily Zoloft 50 mg daily Ingrezza 40 mg daily Patient is voluntary now, will need IVC if attempts to leave. Transfer to psych if  able to manage symptoms without needing injections.   # Left arm pustule, no visible drainage Started doxycycline 100 mg p.o. twice daily for 7 days  # polysubstance abuse: Patient's alcohol level was 206 on admission.  Last week it was as high as 435.  Also other drugs of abuse including amphetamines and THC.   Counseling.   # hyponatremia: Treated with IV fluids.  Stable.  # hypokalemia: Replete potassium.  Keep on scheduled potassium replacement.  # hypomagnesemia: Replete magnesium.  Keep on schedule replacement.   # leukocytosis: Probably due to demargination.  Improved.    Patient is still with hallucinations which is likely multifactorial.  He will benefit with inpatient psychiatric admission.  Currently agrees for voluntary admission.  Transfer to psych when bed available.     Diet: Regular diet DVT Prophylaxis: Subcutaneous Lovenox   Advance goals of care discussion: Full code  Family Communication: None today.  Disposition:  Treating for alcohol withdrawal symptoms.  Will transfer to psych   Subjective: Patient seen and examined.  He tells me that he is uncomfortable, he is having headache.  He tells me he could not sleep well last night.  Patient tells me he was having hallucinations all night.  He is not sure what he can do about it.   Physical Exam:  General: Frail.  Debilitated.  Chronically sick looking. Alert and awake.  Mostly oriented.  Anxious and flat affect. Cardiovascular: S1-S2 normal.  Regular rate rhythm. Respiratory: Bilateral clear.  No added sounds. Gastrointestinal: Soft.  Nontender.  Bowel sound present. Ext: No swelling or edema.  No cyanosis. Neuro:  Moves all extremities equally.  Does have some resting tremors. Musculoskeletal: Patient does have small opened up pustule on the left forearm without evidence of underlying abscess.  Minimal induration.  Vitals:   07/25/22 0450 07/25/22 0700 07/25/22 0740 07/25/22 0800  BP: (!) 155/94  (!)  138/90 (!) 143/95  Pulse: 87 85 99 (!) 107  Resp: (!) 22 (!) 31 (!) 25 (!) 22  Temp:      TempSrc:      SpO2: 94% 93% 96% 96%  Weight:      Height:        Intake/Output Summary (Last 24 hours) at 07/25/2022 0941 Last data filed at 07/25/2022 8119 Gross per 24 hour  Intake --  Output 2960 ml  Net -2960 ml    Filed Weights   07/23/22 0225  Weight: 88.7 kg    Data Reviewed: I have personally reviewed and interpreted daily labs, tele strips, imagings as discussed above. I reviewed all nursing notes, pharmacy notes, vitals, pertinent old records I have discussed plan of care as described above with RN and patient/family.  CBC: Recent Labs  Lab 07/22/22 2108 07/23/22 0314 07/24/22 0254 07/25/22 0258  WBC 15.1* 10.7* 7.0 7.4  HGB 15.2 12.9* 12.3* 12.2*  HCT 44.7 37.8* 37.3* 36.1*  MCV 95.7 94.7 98.7 99.2  PLT 314 228 147* 135*    Basic Metabolic Panel: Recent Labs  Lab 07/22/22 2108 07/23/22 0314 07/23/22 0315 07/23/22 0316 07/23/22 1815 07/24/22 0254 07/25/22 0258  NA 128*  --   --  130* 133* 133* 130*  K 3.4*  --   --  2.7* 3.4* 3.5 3.4*  CL 93*  --   --  95* 101 101 97*  CO2 20*  --   --  23 26 25 26   GLUCOSE 131*  --   --  103* 124* 123* 106*  BUN 5*  --   --  <5* 6 5* 8  CREATININE 0.95 0.81  --  0.86 0.87 0.90 0.72  CALCIUM 8.1*  --   --  7.8* 8.1* 8.0* 8.8*  MG 1.3*  --  1.7  --  1.9 1.8 1.5*  PHOS 1.8*  --   --   --  3.0 2.7 2.7     Studies: No results found.  Scheduled Meds:  ARIPiprazole  2 mg Oral Daily   busPIRone  10 mg Oral BID   chlordiazePOXIDE  10 mg Oral QID   Chlorhexidine Gluconate Cloth  6 each Topical Daily   doxycycline  100 mg Oral Q12H   enoxaparin (LOVENOX) injection  40 mg Subcutaneous Q24H   feeding supplement  237 mL Oral TID BM   folic acid  1 mg Oral Daily   gabapentin  400 mg Oral BID   magnesium oxide  400 mg Oral Once   multivitamin with minerals  1 tablet Oral Daily   mupirocin ointment  1 Application Nasal BID    nicotine  21 mg Transdermal Q0600   potassium chloride  40 mEq Oral BID   saccharomyces boulardii  250 mg Oral BID   sertraline  50 mg Oral Daily   thiamine  100 mg Oral Daily   Or   thiamine  100 mg Intravenous Daily   valbenazine  40 mg Oral Daily   Continuous Infusions:   PRN Meds: acetaminophen, LORazepam, ondansetron **OR** ondansetron (ZOFRAN) IV, mouth rinse, traZODone  Time spent: 40 minutes

## 2022-07-26 ENCOUNTER — Other Ambulatory Visit (HOSPITAL_COMMUNITY): Payer: Self-pay

## 2022-07-26 DIAGNOSIS — F29 Unspecified psychosis not due to a substance or known physiological condition: Secondary | ICD-10-CM

## 2022-07-26 DIAGNOSIS — F1010 Alcohol abuse, uncomplicated: Secondary | ICD-10-CM

## 2022-07-26 DIAGNOSIS — F1910 Other psychoactive substance abuse, uncomplicated: Secondary | ICD-10-CM

## 2022-07-26 DIAGNOSIS — F332 Major depressive disorder, recurrent severe without psychotic features: Secondary | ICD-10-CM

## 2022-07-26 LAB — CBC
HCT: 40.7 % (ref 39.0–52.0)
Hemoglobin: 13.3 g/dL (ref 13.0–17.0)
MCH: 32.9 pg (ref 26.0–34.0)
MCHC: 32.7 g/dL (ref 30.0–36.0)
MCV: 100.7 fL — ABNORMAL HIGH (ref 80.0–100.0)
Platelets: 122 10*3/uL — ABNORMAL LOW (ref 150–400)
RBC: 4.04 MIL/uL — ABNORMAL LOW (ref 4.22–5.81)
RDW: 14.1 % (ref 11.5–15.5)
WBC: 6.5 10*3/uL (ref 4.0–10.5)
nRBC: 0 % (ref 0.0–0.2)

## 2022-07-26 LAB — BASIC METABOLIC PANEL
Anion gap: 9 (ref 5–15)
BUN: 9 mg/dL (ref 6–20)
CO2: 25 mmol/L (ref 22–32)
Calcium: 8.9 mg/dL (ref 8.9–10.3)
Chloride: 97 mmol/L — ABNORMAL LOW (ref 98–111)
Creatinine, Ser: 0.86 mg/dL (ref 0.61–1.24)
GFR, Estimated: >60 mL/min (ref >60)
Glucose, Bld: 100 mg/dL — ABNORMAL HIGH (ref 70–99)
Potassium: 3.8 mmol/L (ref 3.5–5.1)
Sodium: 131 mmol/L — ABNORMAL LOW (ref 135–145)

## 2022-07-26 LAB — MAGNESIUM: Magnesium: 1.8 mg/dL (ref 1.7–2.4)

## 2022-07-26 LAB — PHOSPHORUS: Phosphorus: 4.3 mg/dL (ref 2.5–4.6)

## 2022-07-26 MED ORDER — BUSPIRONE HCL 10 MG PO TABS
10.0000 mg | ORAL_TABLET | Freq: Two times a day (BID) | ORAL | 0 refills | Status: DC
Start: 2022-07-26 — End: 2022-08-24
  Filled 2022-07-26: qty 60, 30d supply, fill #0

## 2022-07-26 MED ORDER — ARIPIPRAZOLE 5 MG PO TABS
5.0000 mg | ORAL_TABLET | Freq: Every day | ORAL | 1 refills | Status: DC
  Filled 2022-07-26: qty 30, 30d supply, fill #0

## 2022-07-26 MED ORDER — CHLORDIAZEPOXIDE HCL 25 MG PO CAPS
25.0000 mg | ORAL_CAPSULE | ORAL | 0 refills | Status: AC
  Filled 2022-07-26: qty 2, 1d supply, fill #0

## 2022-07-26 MED ORDER — SERTRALINE HCL 50 MG PO TABS
50.0000 mg | ORAL_TABLET | Freq: Every day | ORAL | 1 refills | Status: DC
  Filled 2022-07-26: qty 30, 30d supply, fill #0

## 2022-07-26 MED ORDER — CHLORDIAZEPOXIDE HCL 25 MG PO CAPS
25.0000 mg | ORAL_CAPSULE | Freq: Four times a day (QID) | ORAL | 0 refills | Status: DC
  Filled 2022-07-26: qty 4, 1d supply, fill #0

## 2022-07-26 MED ORDER — CHLORDIAZEPOXIDE HCL 25 MG PO CAPS
25.0000 mg | ORAL_CAPSULE | Freq: Three times a day (TID) | ORAL | 0 refills | Status: AC
  Filled 2022-07-26: qty 3, 1d supply, fill #0

## 2022-07-26 MED ORDER — DOXYCYCLINE HYCLATE 100 MG PO TABS
100.0000 mg | ORAL_TABLET | Freq: Two times a day (BID) | ORAL | 0 refills | Status: AC
  Filled 2022-07-26: qty 8, 4d supply, fill #0

## 2022-07-26 MED ORDER — TRAZODONE HCL 50 MG PO TABS
50.0000 mg | ORAL_TABLET | Freq: Every evening | ORAL | 1 refills | Status: DC | PRN
Start: 2022-07-26 — End: 2022-08-24
  Filled 2022-07-26: qty 30, 30d supply, fill #0

## 2022-07-26 MED ORDER — GABAPENTIN 400 MG PO CAPS
400.0000 mg | ORAL_CAPSULE | Freq: Two times a day (BID) | ORAL | 0 refills | Status: DC
Start: 2022-07-26 — End: 2022-08-24
  Filled 2022-07-26: qty 60, 30d supply, fill #0

## 2022-07-26 MED ORDER — CHLORDIAZEPOXIDE HCL 25 MG PO CAPS
25.0000 mg | ORAL_CAPSULE | Freq: Four times a day (QID) | ORAL | 0 refills | Status: AC
  Filled 2022-07-26: qty 30, 8d supply, fill #0

## 2022-07-26 MED ORDER — VALBENAZINE TOSYLATE 40 MG PO CAPS
40.0000 mg | ORAL_CAPSULE | Freq: Every day | ORAL | 1 refills | Status: DC
Start: 2022-07-26 — End: 2022-09-04
  Filled 2022-07-26: qty 30, 30d supply, fill #0

## 2022-07-26 NOTE — TOC CM/SW Note (Signed)
   [facility] Medication Assistance Card  Name: Jerry [name] (MRN): [id number]  Bin: [id number]  RX Group: [id number]  Discharge Date: 07/26/22 Expiration Date: 08/02/22 (must be filled within 7 days of discharge)  Dear [name]:  You have been approved to have the prescriptions written by your discharging physician filled through our [facility] (Medication Assistance Through [facility]) program. This program allows for a one-time (no refills) 34-day supply of selected medications for a low copay amount.  The copay is $3.00 per prescription. For instance, if you have one prescription, you will pay $3.00; for two prescriptions, you pay $6.00; for three prescriptions, you pay $9.00; and so on.  Only certain pharmacies are participating in this program with [facility]. You will need to select one of the pharmacies from the attached list and take your prescriptions, this letter, and your photo ID to one of the participating pharmacies.   We are excited that you are able to use the [facility] program to get your medications. These prescriptions must be filled within 7 days of hospital discharge or they will no longer be valid for the [facility] program. Should you have any problems with your prescriptions please contact your case management team member at [phone] for [place]/ [facility]  Thank you, [facility] Care Management
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
## 2022-07-26 NOTE — TOC Transition Note (Signed)
Transition of Care [facility]) - CM/SW Discharge Note   Patient Details  Name: Jerry [name] MRN: [id number] Date of Birth: 05-21-1975  Transition of Care (TOC) CM/SW Contact:  Armanda Heritage, RN Phone Number: 07/26/2022, 11:51 AM   Clinical Narrative:     CM received notification that patient no longer meets inpatient psych criteria and will discharge to home today.  Patient is uninsured and in need of medication assistance.  Match letter was provided and beds to bed requested.  No further TOC identified at this time.     Barriers to Discharge: Continued Medical Work up   Patient Goals and CMS Choice      Discharge Placement                         Discharge Plan and Services Additional resources added to the After Visit Summary for   In-house Referral: NA Discharge Planning Services: CM Consult            DME Arranged: N/A DME Agency: NA       HH Arranged: NA HH Agency: NA        Social Determinants of Health (SDOH) Interventions SDOH Screenings   Food Insecurity: Food Insecurity Present (07/23/2022)  Housing: High Risk (07/23/2022)  Transportation Needs: Unmet Transportation Needs (07/23/2022)  Utilities: At Risk (07/23/2022)  Alcohol Screen: Medium Risk (10/30/2020)  Depression (PHQ2-9): Low Risk  (07/11/2022)  Recent Concern: Depression (PHQ2-9) - High Risk (07/09/2022)  Tobacco Use: High Risk (07/22/2022)     Readmission Risk Interventions     No data to display

## 2022-07-27 ENCOUNTER — Other Ambulatory Visit (HOSPITAL_COMMUNITY): Payer: Self-pay

## 2022-08-10 ENCOUNTER — Other Ambulatory Visit: Payer: Self-pay

## 2022-08-10 ENCOUNTER — Emergency Department (HOSPITAL_COMMUNITY)
Admission: EM | Admit: 2022-08-10 | Discharge: 2022-08-11 | Disposition: A | Discharge status: Other Acute Inpatient Hospital | Payer: Medicaid Other | Attending: Emergency Medicine | Admitting: Emergency Medicine

## 2022-08-10 DIAGNOSIS — R440 Auditory hallucinations: Secondary | ICD-10-CM | POA: Diagnosis present

## 2022-08-10 DIAGNOSIS — F17210 Nicotine dependence, cigarettes, uncomplicated: Secondary | ICD-10-CM | POA: Insufficient documentation

## 2022-08-10 DIAGNOSIS — R45851 Suicidal ideations: Secondary | ICD-10-CM | POA: Diagnosis not present

## 2022-08-10 DIAGNOSIS — F250 Schizoaffective disorder, bipolar type: Secondary | ICD-10-CM | POA: Diagnosis present

## 2022-08-10 DIAGNOSIS — Z79899 Other long term (current) drug therapy: Secondary | ICD-10-CM | POA: Insufficient documentation

## 2022-08-10 DIAGNOSIS — Z20822 Contact with and (suspected) exposure to covid-19: Secondary | ICD-10-CM | POA: Diagnosis not present

## 2022-08-10 LAB — COMPREHENSIVE METABOLIC PANEL
ALT: 18 U/L (ref 0–44)
AST: 19 U/L (ref 15–41)
Albumin: 3 g/dL — ABNORMAL LOW (ref 3.5–5.0)
Alkaline Phosphatase: 44 U/L (ref 38–126)
Anion gap: 8 (ref 5–15)
BUN: <5 mg/dL — ABNORMAL LOW (ref 6–20)
CO2: 26 mmol/L (ref 22–32)
Calcium: 8.2 mg/dL — ABNORMAL LOW (ref 8.9–10.3)
Chloride: 99 mmol/L (ref 98–111)
Creatinine, Ser: 0.88 mg/dL (ref 0.61–1.24)
GFR, Estimated: >60 mL/min (ref >60)
Glucose, Bld: 97 mg/dL (ref 70–99)
Potassium: 3.5 mmol/L (ref 3.5–5.1)
Sodium: 133 mmol/L — ABNORMAL LOW (ref 135–145)
Total Bilirubin: 0.8 mg/dL (ref 0.3–1.2)
Total Protein: 7.5 g/dL (ref 6.5–8.1)

## 2022-08-10 LAB — RAPID URINE DRUG SCREEN, HOSP PERFORMED
Amphetamines: NOT DETECTED
Barbiturates: NOT DETECTED
Benzodiazepines: POSITIVE — AB
Cocaine: NOT DETECTED
Opiates: NOT DETECTED
Tetrahydrocannabinol: NOT DETECTED

## 2022-08-10 LAB — CBC
HCT: 35.4 % — ABNORMAL LOW (ref 39.0–52.0)
Hemoglobin: 11.6 g/dL — ABNORMAL LOW (ref 13.0–17.0)
MCH: 32.6 pg (ref 26.0–34.0)
MCHC: 32.8 g/dL (ref 30.0–36.0)
MCV: 99.4 fL (ref 80.0–100.0)
Platelets: 339 10*3/uL (ref 150–400)
RBC: 3.56 MIL/uL — ABNORMAL LOW (ref 4.22–5.81)
RDW: 13.3 % (ref 11.5–15.5)
WBC: 6.5 10*3/uL (ref 4.0–10.5)
nRBC: 0 % (ref 0.0–0.2)

## 2022-08-10 LAB — ETHANOL: Alcohol, Ethyl (B): <10 mg/dL (ref <10)

## 2022-08-10 LAB — ACETAMINOPHEN LEVEL: Acetaminophen (Tylenol), Serum: <10 ug/mL — ABNORMAL LOW (ref 10–30)

## 2022-08-10 LAB — SALICYLATE LEVEL: Salicylate Lvl: <7 mg/dL — ABNORMAL LOW (ref 7.0–30.0)

## 2022-08-10 MED ORDER — TRAZODONE HCL 50 MG PO TABS
50.0000 mg | ORAL_TABLET | Freq: Every evening | ORAL | Status: DC | PRN
  Administered 2022-08-10: 50 mg via ORAL
  Filled 2022-08-10: qty 1

## 2022-08-10 MED ORDER — NICOTINE 21 MG/24HR TD PT24
21.0000 mg | MEDICATED_PATCH | Freq: Every day | TRANSDERMAL | Status: DC
  Administered 2022-08-11: 21 mg via TRANSDERMAL
  Filled 2022-08-10: qty 1

## 2022-08-10 MED ORDER — ARIPIPRAZOLE 5 MG PO TABS: 5.0000 mg | ORAL_TABLET | Freq: Every day | ORAL | Status: DC

## 2022-08-10 MED ORDER — GABAPENTIN 400 MG PO CAPS
400.0000 mg | ORAL_CAPSULE | Freq: Two times a day (BID) | ORAL | Status: DC
  Administered 2022-08-10 – 2022-08-11 (×2): 400 mg via ORAL
  Filled 2022-08-10 (×2): qty 1

## 2022-08-10 MED ORDER — MAGNESIUM OXIDE -MG SUPPLEMENT 400 (240 MG) MG PO TABS
400.0000 mg | ORAL_TABLET | Freq: Every day | ORAL | Status: DC
  Administered 2022-08-10 – 2022-08-11 (×2): 400 mg via ORAL
  Filled 2022-08-10 (×2): qty 1

## 2022-08-10 MED ORDER — ADULT MULTIVITAMIN W/MINERALS CH
1.0000 | ORAL_TABLET | Freq: Every day | ORAL | Status: DC
  Administered 2022-08-10 – 2022-08-11 (×2): 1 via ORAL
  Filled 2022-08-10 (×2): qty 1

## 2022-08-10 MED ORDER — SERTRALINE HCL 50 MG PO TABS
50.0000 mg | ORAL_TABLET | Freq: Every day | ORAL | Status: DC
  Administered 2022-08-10 – 2022-08-11 (×2): 50 mg via ORAL
  Filled 2022-08-10 (×2): qty 1

## 2022-08-10 MED ORDER — THIAMINE MONONITRATE 100 MG PO TABS
100.0000 mg | ORAL_TABLET | Freq: Every day | ORAL | Status: DC
  Administered 2022-08-10 – 2022-08-11 (×2): 100 mg via ORAL
  Filled 2022-08-10 (×2): qty 1

## 2022-08-10 MED ORDER — FOLIC ACID 1 MG PO TABS
1.0000 mg | ORAL_TABLET | Freq: Every day | ORAL | Status: DC
  Administered 2022-08-10 – 2022-08-11 (×2): 1 mg via ORAL
  Filled 2022-08-10 (×2): qty 1

## 2022-08-10 MED ORDER — ARIPIPRAZOLE 5 MG PO TABS
5.0000 mg | ORAL_TABLET | Freq: Every day | ORAL | Status: DC
  Administered 2022-08-11: 5 mg via ORAL
  Filled 2022-08-10: qty 1

## 2022-08-10 MED ORDER — BUSPIRONE HCL 10 MG PO TABS
10.0000 mg | ORAL_TABLET | Freq: Two times a day (BID) | ORAL | Status: DC
  Administered 2022-08-10 – 2022-08-11 (×2): 10 mg via ORAL
  Filled 2022-08-10 (×2): qty 1

## 2022-08-10 NOTE — ED Triage Notes (Signed)
Pt states he had medication changed 1 month ago and since then has been having suicidal thoughts and auditory hallucinations. Denies etoh and drug use for over 1 month.

## 2022-08-10 NOTE — ED Provider Notes (Signed)
[place] EMERGENCY DEPARTMENT AT [facility] Provider Note  CSN: [id number] Arrival date & time: 08/10/22 1555  Chief Complaint(s) Suicidal  HPI Jerry [name] is a 47 y.o. male history of schizophrenia, alcohol use disorder presenting to the emergency department with hallucinations.  Patient reports that since his medications were changed 1 month ago, he has had increased hallucinations, hearing voices telling him to hurt himself.  He reports suicidal ideation with a plan to jump in front of a car.  He denies any self injury or ingestions.  No fevers or chills, cough, abdominal pain, chest pain, headaches, medical complaints.  Reports that he has been sober from alcohol for 1 month.    Past Medical History Past Medical History:  Diagnosis Date   Anxiety    Bipolar 1 disorder (HCC)    Depression    Drug-induced seizure (HCC)    EtOH dependence (HCC)    Hallucination    Paranoid schizophrenia (HCC)    since pt's 20's   PTSD (post-traumatic stress disorder)    Patient Active Problem List   Diagnosis Date Noted   Alcohol withdrawal (HCC) 07/22/2022   Schizoaffective disorder, bipolar type (HCC) 07/09/2022   Schizophrenia (HCC) 07/07/2022   Auditory hallucination    Malingering 09/07/2020   Homelessness 09/07/2020   Substance induced mood disorder (HCC) 09/07/2020   Major depressive disorder, recurrent severe without psychotic features (HCC) 07/17/2020   Schizophrenia spectrum disorder with psychotic disorder type not yet determined (HCC) 07/17/2020   Methamphetamine abuse (HCC) 07/17/2020   Marijuana abuse 07/17/2020   Cocaine abuse (HCC) 07/17/2020   Anxiety and depression 06/25/2014   Family history of diabetes mellitus (DM) 06/25/2014   Family history of thyroid disease 06/25/2014   Tobacco abuse 06/25/2014   Alcohol abuse 05/18/2013   Polysubstance abuse (HCC) 05/18/2013   Depression, unspecified 05/18/2013   Home Medication(s) Prior to Admission  medications   Medication Sig Start Date End Date Taking? Authorizing Provider  ARIPiprazole (ABILIFY) 5 MG tablet Take 1 tablet (5 mg total) by mouth daily. 07/27/22   Starkes-Perry, Juel Burrow, FNP  busPIRone (BUSPAR) 10 MG tablet Take 1 tablet (10 mg total) by mouth 2 (two) times daily. 07/26/22 08/25/22  Starkes-Perry, Juel Burrow, FNP  folic acid (FOLVITE) 1 MG tablet Take 1 tablet (1 mg total) by mouth daily. 07/26/22   Dorcas Carrow, MD  gabapentin (NEURONTIN) 400 MG capsule Take 1 capsule (400 mg total) by mouth 2 (two) times daily. 07/26/22   Starkes-Perry, Juel Burrow, FNP  Magnesium 200 MG TABS Take 2 tablets (400 mg total) by mouth daily. 07/25/22   Dorcas Carrow, MD  Multiple Vitamin (MULTIVITAMIN WITH MINERALS) TABS tablet Take 1 tablet by mouth daily. 07/26/22   Dorcas Carrow, MD  nicotine (NICODERM CQ - DOSED IN MG/24 HOURS) 21 mg/24hr patch Place 1 patch (21 mg total) onto the skin daily at 6 (six) AM. 07/11/22   Lamar Sprinkles, MD  potassium chloride SA (KLOR-CON M) 20 MEQ tablet Take 2 tablets (40 mEq total) by mouth daily for 2 days. 07/25/22 07/27/22  Dorcas Carrow, MD  sertraline (ZOLOFT) 50 MG tablet Take 1 tablet (50 mg total) by mouth daily. 07/26/22   Starkes-Perry, Juel Burrow, FNP  thiamine (VITAMIN B-1) 100 MG tablet Take 1 tablet (100 mg total) by mouth daily. 07/26/22 08/25/22  Dorcas Carrow, MD  traZODone (DESYREL) 50 MG tablet Take 1 tablet (50 mg total) by mouth at bedtime as needed for sleep. 07/26/22   [name],  FNP  valbenazine (INGREZZA) 40 MG capsule Take 1 capsule (40 mg total) by mouth daily. 07/26/22   [name], FNP                                                                                                                                    Past Surgical History Past Surgical History:  Procedure Laterality Date   KIDNEY DONATION Right 2003   Family History Family History  Problem Relation Age of Onset   Heart disease Mother    Cancer Mother    Heart disease  Father    Diabetes Father    Diabetes Maternal Grandmother    Stroke Paternal Grandfather     Social History Social History   Tobacco Use   Smoking status: Every Day    Packs/day: 2    Types: Cigarettes   Smokeless tobacco: Never  Vaping Use   Vaping Use: Never used  Substance Use Topics   Alcohol use: Yes    Alcohol/week: 32.0 standard drinks of alcohol    Types: 12 Cans of beer, 20 Shots of liquor per week   Drug use: Yes    Types: Cocaine, Heroin, Amphetamines, Methamphetamines, Marijuana   Allergies Penicillins  Review of Systems Review of Systems  All other systems reviewed and are negative.   Physical Exam Vital Signs  I have reviewed the triage vital signs BP 103/88 (BP Location: Left Arm)   Pulse 69   Temp 99.5 F (37.5 C) (Oral)   Resp 16   Ht 5\' 9"  (1.753 m)   Wt 88 kg   SpO2 98%   BMI 28.65 kg/m  Physical Exam Vitals and nursing note reviewed.  Constitutional:      General: He is not in acute distress.    Appearance: Normal appearance.  HENT:     Mouth/Throat:     Mouth: Mucous membranes are moist.  Eyes:     Conjunctiva/sclera: Conjunctivae normal.  Cardiovascular:     Rate and Rhythm: Normal rate and regular rhythm.  Pulmonary:     Effort: Pulmonary effort is normal. No respiratory distress.     Breath sounds: Normal breath sounds.  Abdominal:     General: Abdomen is flat.     Palpations: Abdomen is soft.     Tenderness: There is no abdominal tenderness.  Musculoskeletal:     Right lower leg: No edema.     Left lower leg: No edema.  Skin:    General: Skin is warm and dry.     Capillary Refill: Capillary refill takes less than 2 seconds.  Neurological:     Mental Status: He is alert and oriented to person, place, and time. Mental status is at baseline.  Psychiatric:        Mood and Affect: Mood normal.        Behavior: Behavior normal.     ED Results and Treatments Labs (all labs  ordered are listed, but only abnormal results  are displayed) Labs Reviewed  COMPREHENSIVE METABOLIC PANEL - Abnormal; Notable for the following components:      Result Value   Sodium 133 (*)    BUN <5 (*)    Calcium 8.2 (*)    Albumin 3.0 (*)    All other components within normal limits  SALICYLATE LEVEL - Abnormal; Notable for the following components:   Salicylate Lvl <7.0 (*)    All other components within normal limits  ACETAMINOPHEN LEVEL - Abnormal; Notable for the following components:   Acetaminophen (Tylenol), Serum <10 (*)    All other components within normal limits  CBC - Abnormal; Notable for the following components:   RBC 3.56 (*)    Hemoglobin 11.6 (*)    HCT 35.4 (*)    All other components within normal limits  ETHANOL  RAPID URINE DRUG SCREEN, HOSP PERFORMED                                                                                                                          Radiology No results found.  Pertinent labs & imaging results that were available during my care of the patient were reviewed by me and considered in my medical decision making (see MDM for details).  Medications Ordered in ED Medications - No data to display                                                                                                                                   Procedures Procedures  (including critical care time)  Medical Decision Making / ED Course   MDM:  47 year old male presenting to the emergency department with hallucinations.  Patient overall well-appearing, he is help seeking.  Endorses suicidal ideation.  Will obtain medical clearance labs although patient denies any medical complaints and his exam is unremarkable.  No signs of active alcohol withdrawal and he denies any alcohol use in the last month.  Patient will need psychiatric consult and likely inpatient admission.  Clinical Course as of 08/10/22 1840  Thu Aug 10, 2022  1837 Medical screening labs reassuring. Will consult  psychiatry  [WS]    Clinical Course User Index [WS] Lonell Grandchild, MD     Additional history obtained:  -External records from outside source obtained and reviewed including: Chart review including previous notes, labs, imaging, consultation notes including admission  for alcohol withdrawal earlier this month   Lab Tests: -I ordered, reviewed, and interpreted labs.   The pertinent results include:   Labs Reviewed  COMPREHENSIVE METABOLIC PANEL - Abnormal; Notable for the following components:      Result Value   Sodium 133 (*)    BUN <5 (*)    Calcium 8.2 (*)    Albumin 3.0 (*)    All other components within normal limits  SALICYLATE LEVEL - Abnormal; Notable for the following components:   Salicylate Lvl <7.0 (*)    All other components within normal limits  ACETAMINOPHEN LEVEL - Abnormal; Notable for the following components:   Acetaminophen (Tylenol), Serum <10 (*)    All other components within normal limits  CBC - Abnormal; Notable for the following components:   RBC 3.56 (*)    Hemoglobin 11.6 (*)    HCT 35.4 (*)    All other components within normal limits  ETHANOL  RAPID URINE DRUG SCREEN, HOSP PERFORMED    Notable for mild hyponatremia   Medicines ordered and prescription drug management: No orders of the defined types were placed in this encounter.   -I have reviewed the patients home medicines and have made adjustments as needed  Social Determinants of Health:  Diagnosis or treatment significantly limited by social determinants of health: alcohol use and homelessness   Reevaluation: After the interventions noted above, I reevaluated the patient and found that their symptoms have improved  Co morbidities that complicate the patient evaluation  Past Medical History:  Diagnosis Date   Anxiety    Bipolar 1 disorder (HCC)    Depression    Drug-induced seizure (HCC)    EtOH dependence (HCC)    Hallucination    Paranoid schizophrenia (HCC)     since pt's 20's   PTSD (post-traumatic stress disorder)       Dispostion: Disposition decision including need for hospitalization was considered, and patient boarding for psychiatric consultation    Final Clinical Impression(s) / ED Diagnoses Final diagnoses:  Suicidal ideation     This chart was dictated using voice recognition software.  Despite best efforts to proofread,  errors can occur which can change the documentation meaning.    Lonell Grandchild, MD 08/10/22 1840

## 2022-08-11 ENCOUNTER — Encounter (HOSPITAL_COMMUNITY): Payer: Self-pay | Admitting: Psychiatry

## 2022-08-11 DIAGNOSIS — F250 Schizoaffective disorder, bipolar type: Secondary | ICD-10-CM

## 2022-08-11 LAB — SARS CORONAVIRUS 2 BY RT PCR: SARS Coronavirus 2 by RT PCR: NEGATIVE

## 2022-08-11 MED ORDER — VALBENAZINE TOSYLATE 40 MG PO CAPS
40.0000 mg | ORAL_CAPSULE | Freq: Every day | ORAL | Status: DC
  Administered 2022-08-11: 40 mg via ORAL
  Filled 2022-08-11: qty 1

## 2022-08-11 NOTE — Progress Notes (Signed)
Pt was accepted to [facility] TODAY 08/11/2022, pending negative covid faxed to [phone]. Bed assignment: [facility]  Pt meets inpatient criteria per Alona Bene, PMHNP  Attending Physician will be Joylene Igo, AGPCNP  Report can be called to: [phone]  Pt can arrive after negative covid is obtained and faxed; bed is ready now  Care Team Notified: Alona Bene, PMHNP, Teressa Senter, RN, Latricia Heft, Paramedic, and [facility], RN  [place], [place]  08/11/2022 2:25 PM

## 2022-08-11 NOTE — Progress Notes (Signed)
LCSW Progress Note  [id number]   Jerry [name]  08/11/2022  1:45 PM  Description:   Inpatient Psychiatric Referral  Patient was recommended inpatient per [facility], PMHNP. There are no available beds at [facility], per [facility], RN. Patient was referred to the following out of network facilities:   [facility] Provider Address Phone Fax  [facility]  [address], [place] [address] [phone]  [facility]  [address] [place] [address] [phone]  [facility] [address]  [address], [place] [address] [phone]  [facility]  [address] [place] [address] [phone]  [facility]  [address] [place] [address] [phone]  [facility]  [phone] [place] [address] [phone]  [facility]  [address] [place], [place] [address] [phone]  [facility]  [address] [place] [address] [phone]  [facility]  [address] [place] [address] [phone]  [facility]  [address], [place] [address] [phone]  [facility]  [address] [place] [address] [phone]  [facility]  [address] [place] [address] [phone]  [facility]  [address] [place] [address] [phone]  [facility]  [address] [place] [address] [phone]  [facility]  [address], [place] [address] [phone]  [facility]  [address], [place]  [place] [address] [phone]  [facility]  [address] [place], [place] [address] [phone]  [facility]  [address], [place] [address] [phone]  [facility]  [address] [place] [address] [phone]  [facility]  [address] [place] [address] [phone]  [facility]  [address] [place] [address] [phone]  [facility]  [address] [employer], [place] [address] [phone]  [facility]  [address] [place] [address] [phone]  [facility]  [address] [place], [place] [address] [phone]    Situation ongoing, CSW to continue following and update chart as more information becomes available.      Cathie Beams, [place]  08/11/2022 1:45 PM

## 2022-08-11 NOTE — Progress Notes (Signed)
[facility] Psych ED Progress Note  08/11/2022 12:24 PM Jerry [name]  MRN:  [id number]   Principal Problem: Schizoaffective disorder, bipolar type [facility] Diagnosis:  Principal Problem:   Schizoaffective disorder, bipolar type [facility])   ED Assessment Time Calculation: Start Time: 1145 Stop Time: 1200 Total Time in Minutes (Assessment Completion): 15    Subjective:  On evaluation today, the patient is laying in bed, I no acute distress. He is calm and cooperative during this assessment. His appearance is appropriate for environment, somewhat disheveled. His eye contact is good. Speech is clear and coherent, normal pace and normal volume. While talking patient appears to have some tardive dyskinesia, he has lip-smacking, and states that it was the side effects of his medications, he was no sure which one, but states he has Ingrezza for it. He is alert and oriented x4 to person, place, time, and situation. He reports his mood is "ok", his affect is flat.  Thought process is coherent. Thought content is slightly within normal limits. Patient reports that he intermittently hears voices of unfamiliar people that say thing like "they are going to kill me". Patient also endorse visual hallucinations saying that he sees different faces of unfamiliar people. No indication that he is responding to internal stimuli during this assessment.  No delusions elicited during this assessment. He continues to endorse suicidal ideations, says he has no plan, he is just tired of the voices. He denies homicidal ideations. Appetite is good, says he has no problem with his appetite, he says he can not get any sleep, because he is up all night, due to the voices and anxiety. Support, encouragement and reassurance provided about ongoing stressors and patient provided with opportunity for questions. Patient refused to contract for safety. Discussed with patient that he is recommended for inpatient, and we are working hard to find a  facility for him. Patient said he understood.    Past Psychiatric History: Multi-year history of psychotic sx's, previously complicated by polysubstance abuse   [place] Scale:  Flowsheet Row ED from 08/10/2022 in [facility] Department at [facility] ED to Hosp-Admission (Discharged) from 07/22/2022 in [facility] from 07/15/2022 in [facility] Department at [facility]  C-SSRS RISK CATEGORY High Risk High Risk High Risk       Past Medical History:  Past Medical History:  Diagnosis Date   Anxiety    Bipolar 1 disorder (HCC)    Depression    Drug-induced seizure (HCC)    EtOH dependence (HCC)    Hallucination    Paranoid schizophrenia (HCC)    since pt's 20's   PTSD (post-traumatic stress disorder)     Past Surgical History:  Procedure Laterality Date   KIDNEY DONATION Right 2003   Family History:  Family History  Problem Relation Age of Onset   Heart disease Mother    Cancer Mother    Heart disease Father    Diabetes Father    Diabetes Maternal Grandmother    Stroke Paternal Grandfather     Social History:  Social History   Substance and Sexual Activity  Alcohol Use Yes   Alcohol/week: 32.0 standard drinks of alcohol   Types: 12 Cans of beer, 20 Shots of liquor per week     Social History   Substance and Sexual Activity  Drug Use Yes   Types: Cocaine, Heroin, Amphetamines, Methamphetamines, Marijuana    Social History   Socioeconomic History   Marital status: Married  Spouse name: Not on file   Number of children: Not on file   Years of education: Not on file   Highest education level: Not on file  Occupational History   Not on file  Tobacco Use   Smoking status: Every Day    Packs/day: 2    Types: Cigarettes   Smokeless tobacco: Never  Vaping Use   Vaping Use: Never used  Substance and Sexual Activity   Alcohol use: Yes    Alcohol/week: 32.0 standard drinks of alcohol     Types: 12 Cans of beer, 20 Shots of liquor per week   Drug use: Yes    Types: Cocaine, Heroin, Amphetamines, Methamphetamines, Marijuana   Sexual activity: Yes  Other Topics Concern   Not on file  Social History Narrative   Pt has been homeless for the past year and living outdoors for the past week; previously he has been living in hotels. Separated from wife. No family support.   Social Determinants of Health   Financial Resource Strain: Not on file  Food Insecurity: Food Insecurity Present (07/23/2022)   Hunger Vital Sign    Worried About Running Out of Food in the Last Year: Often true    Ran Out of Food in the Last Year: Often true  Transportation Needs: Unmet Transportation Needs (07/23/2022)   PRAPARE - Administrator, Civil Service (Medical): Yes    Lack of Transportation (Non-Medical): Yes  Physical Activity: Not on file  Stress: Not on file  Social Connections: Not on file    Sleep: Fair  Appetite:  Good  Current Medications: Current Facility-Administered Medications  Medication Dose Route Frequency Provider Last Rate Last Admin   ARIPiprazole (ABILIFY) tablet 5 mg  5 mg Oral Daily Melene Plan, DO   5 mg at 08/11/22 4098   busPIRone (BUSPAR) tablet 10 mg  10 mg Oral BID Melene Plan, DO   10 mg at 08/11/22 1191   folic acid (FOLVITE) tablet 1 mg  1 mg Oral Daily Melene Plan, DO   1 mg at 08/11/22 4782   gabapentin (NEURONTIN) capsule 400 mg  400 mg Oral BID Melene Plan, DO   400 mg at 08/11/22 9562   magnesium oxide (MAG-OX) tablet 400 mg  400 mg Oral Daily Melene Plan, DO   400 mg at 08/11/22 1308   multivitamin with minerals tablet 1 tablet  1 tablet Oral Daily Melene Plan, DO   1 tablet at 08/11/22 6578   nicotine (NICODERM CQ - dosed in mg/24 hours) patch 21 mg  21 mg Transdermal Q0600 Melene Plan, DO   21 mg at 08/11/22 4696   sertraline (ZOLOFT) tablet 50 mg  50 mg Oral Daily Melene Plan, DO   50 mg at 08/11/22 2952   thiamine (VITAMIN B1) tablet 100 mg  100 mg  Oral Daily Melene Plan, DO   100 mg at 08/11/22 8413   traZODone (DESYREL) tablet 50 mg  50 mg Oral QHS PRN Melene Plan, DO   50 mg at 08/10/22 2227   Current Outpatient Medications  Medication Sig Dispense Refill   ARIPiprazole (ABILIFY) 5 MG tablet Take 1 tablet (5 mg total) by mouth daily. 30 tablet 1   busPIRone (BUSPAR) 10 MG tablet Take 1 tablet (10 mg total) by mouth 2 (two) times daily. 60 tablet 0   gabapentin (NEURONTIN) 400 MG capsule Take 1 capsule (400 mg total) by mouth 2 (two) times daily. 60 capsule 0   Nicotine (NICODERM CQ  TD) Place 1 patch onto the skin daily as needed (for smoking cessation while hospitalized).     sertraline (ZOLOFT) 50 MG tablet Take 1 tablet (50 mg total) by mouth daily. 30 tablet 1   traZODone (DESYREL) 50 MG tablet Take 1 tablet (50 mg total) by mouth at bedtime as needed for sleep. 30 tablet 1   folic acid (FOLVITE) 1 MG tablet Take 1 tablet (1 mg total) by mouth daily. (Patient not taking: Reported on 08/10/2022)     Magnesium 200 MG TABS Take 2 tablets (400 mg total) by mouth daily. (Patient not taking: Reported on 08/10/2022) 30 tablet    Multiple Vitamin (MULTIVITAMIN WITH MINERALS) TABS tablet Take 1 tablet by mouth daily. (Patient not taking: Reported on 08/10/2022)     nicotine (NICODERM CQ - DOSED IN MG/24 HOURS) 21 mg/24hr patch Place 1 patch (21 mg total) onto the skin daily at 6 (six) AM. (Patient not taking: Reported on 08/10/2022) 28 patch 0   potassium chloride SA (KLOR-CON M) 20 MEQ tablet Take 2 tablets (40 mEq total) by mouth daily for 2 days. (Patient not taking: Reported on 08/10/2022)     thiamine (VITAMIN B-1) 100 MG tablet Take 1 tablet (100 mg total) by mouth daily. (Patient not taking: Reported on 08/10/2022) 30 tablet 0   valbenazine (INGREZZA) 40 MG capsule Take 1 capsule (40 mg total) by mouth daily. (Patient not taking: Reported on 08/10/2022) 30 capsule 1    Lab Results:  Results for orders placed or performed during the hospital  encounter of 08/10/22 (from the past 48 hour(s))  Comprehensive metabolic panel     Status: Abnormal   Collection Time: 08/10/22  5:52 PM  Result Value Ref Range   Sodium 133 (L) 135 - 145 mmol/L   Potassium 3.5 3.5 - 5.1 mmol/L   Chloride 99 98 - 111 mmol/L   CO2 26 22 - 32 mmol/L   Glucose, Bld 97 70 - 99 mg/dL    Comment: Glucose reference range applies only to samples taken after fasting for at least 8 hours.   BUN <5 (L) 6 - 20 mg/dL   Creatinine, Ser 8.11 0.61 - 1.24 mg/dL   Calcium 8.2 (L) 8.9 - 10.3 mg/dL   Total Protein 7.5 6.5 - 8.1 g/dL   Albumin 3.0 (L) 3.5 - 5.0 g/dL   AST 19 15 - 41 U/L   ALT 18 0 - 44 U/L   Alkaline Phosphatase 44 38 - 126 U/L   Total Bilirubin 0.8 0.3 - 1.2 mg/dL   GFR, Estimated >91 >47 mL/min    Comment: (NOTE) Calculated using the CKD-EPI Creatinine Equation (2021)    Anion gap 8 5 - 15    Comment: Performed at [facility], [address], [place], [place] [address]  Ethanol     Status: None   Collection Time: 08/10/22  5:52 PM  Result Value Ref Range   Alcohol, Ethyl (B) <10 <10 mg/dL    Comment: (NOTE) Lowest detectable limit for serum alcohol is 10 mg/dL.  For medical purposes only. Performed at [facility], [address] [place], [place] [address]   Salicylate level     Status: Abnormal   Collection Time: 08/10/22  5:52 PM  Result Value Ref Range   Salicylate Lvl <7.0 (L) 7.0 - 30.0 mg/dL    Comment: Performed at [facility], [address], [place], [place] [address]  Acetaminophen level     Status:  Abnormal   Collection Time: 08/10/22  5:52 PM  Result Value Ref Range   Acetaminophen (Tylenol), Serum <10 (L) 10 - 30 ug/mL    Comment: (NOTE) Therapeutic concentrations vary significantly. A range of 10-30 ug/mL  may be an effective concentration for many patients. However, some  are best treated at concentrations outside of this range. Acetaminophen concentrations >150  ug/mL at 4 hours after ingestion  and >50 ug/mL at 12 hours after ingestion are often associated with  toxic reactions.  Performed at [facility], [address], [place], [place] [address]   cbc     Status: Abnormal   Collection Time: 08/10/22  5:52 PM  Result Value Ref Range   WBC 6.5 4.0 - 10.5 K/uL   RBC 3.56 (L) 4.22 - 5.81 MIL/uL   Hemoglobin 11.6 (L) 13.0 - 17.0 g/dL   HCT 60.4 (L) 54.0 - 98.1 %   MCV 99.4 80.0 - 100.0 fL   MCH 32.6 26.0 - 34.0 pg   MCHC 32.8 30.0 - 36.0 g/dL   RDW 19.1 47.8 - 29.5 %   Platelets 339 150 - 400 K/uL   nRBC 0.0 0.0 - 0.2 %    Comment: Performed at [facility], [address], [place], [place] [address]  Rapid urine drug screen (hospital performed)     Status: Abnormal   Collection Time: 08/10/22 10:24 PM  Result Value Ref Range   Opiates NONE DETECTED NONE DETECTED   Cocaine NONE DETECTED NONE DETECTED   Benzodiazepines POSITIVE (A) NONE DETECTED   Amphetamines NONE DETECTED NONE DETECTED   Tetrahydrocannabinol NONE DETECTED NONE DETECTED   Barbiturates NONE DETECTED NONE DETECTED    Comment: (NOTE) DRUG SCREEN FOR MEDICAL PURPOSES ONLY.  IF CONFIRMATION IS NEEDED FOR ANY PURPOSE, NOTIFY LAB WITHIN 5 DAYS.  LOWEST DETECTABLE LIMITS FOR URINE DRUG SCREEN Drug Class                     Cutoff (ng/mL) Amphetamine and metabolites    1000 Barbiturate and metabolites    200 Benzodiazepine                 200 Opiates and metabolites        300 Cocaine and metabolites        300 THC                            50 Performed at [facility], [address] [place], [place] [address]     Blood Alcohol level:  Lab Results  Component Value Date   ETH <10 08/10/2022   ETH 206 (H) 07/22/2022    Physical Findings:  CIWA:    COWS:     Musculoskeletal: Strength & Muscle Tone: within normal limits Gait & Station: normal Patient leans: N/A  Psychiatric Specialty  Exam:  Presentation  General Appearance:  Disheveled  Eye Contact: Fleeting  Speech: Clear and Coherent  Speech Volume: Normal  Handedness: Right   Mood and Affect  Mood: Hopeless  Affect: Depressed; Flat   Thought Process  Thought Processes: Coherent  Descriptions of Associations:Intact  Orientation:Full (Time, Place and Person)  Thought Content:WDL  History of Schizophrenia/Schizoaffective disorder:Yes  Duration of Psychotic Symptoms:Greater than six months  Hallucinations:Hallucinations: Auditory; Visual Description of Command Hallucinations: Voices telling him that he is worthless and needs to die Description of Auditory Hallucinations: keeps hearing unfamiliar voices that are saying  people are going to kill him Description of Visual Hallucinations: says he sees random unfamilar people  Ideas of Reference:None  Suicidal Thoughts:Suicidal Thoughts: Yes, Passive SI Active Intent and/or Plan: With Intent SI Passive Intent and/or Plan: Without Plan  Homicidal Thoughts:Homicidal Thoughts: No   Sensorium  Memory: Immediate Fair; Recent Fair  Judgment: Fair  Insight: Fair   Chartered certified accountant: Fair  Attention Span: Fair  Recall: [employer] of Knowledge: Fair  Language: Fair   Psychomotor Activity  Psychomotor Activity: Psychomotor Activity: Extrapyramidal Side Effects (EPS) (patient has lip smackig) Extrapyramidal Side Effects (EPS): Tardive Dyskinesia AIMS Completed?: No   Assets  Assets: Manufacturing systems engineer; Social Support   Sleep  Sleep: Sleep: Poor    Physical Exam: Physical Exam Vitals and nursing note reviewed. Exam conducted with a chaperone present.  Psychiatric:        Attention and Perception: Attention normal.        Mood and Affect: Mood is depressed. Affect is flat.        Speech: Speech normal.        Behavior: Behavior is cooperative.        Thought Content: Thought content  includes suicidal ideation.        Cognition and Memory: Cognition is impaired.    Review of Systems  Constitutional: Negative.   Musculoskeletal: Negative.   Psychiatric/Behavioral:  Positive for depression, hallucinations and suicidal ideas.    Blood pressure 115/77, pulse (!) 56, temperature 98.6 F (37 C), temperature source Oral, resp. rate 18, height 5\' 9"  (1.753 m), weight 88 kg, SpO2 93 %. Body mass index is 28.65 kg/m.   Medical Decision Making: Daily contact with patient to assess and evaluate symptoms and progress in treatment, Medication management, and Plan Patient is actively suicidal with command hallucinations, continued recommendation to an inpatient facility for mood stabilization and medication management.  He needs to continue on the following medications:    Abilify 5 mg qd for mood control.  For now, continue this dosage, but will need to be examined in inpatient service vis-a-vis risks/benefits (with TD) Neurontin, 400 mg bid for anxiety and alcohol withdrawal Zoloft, 50 mg qd for depression/anxiety Ingrezza, 40 mg qd for tardive dyskinesia Trazodone, 50 mg qhs PRN sleep.  Patient being reviewed [facility] behavioral health hospital [facility] spoke with [facility] for possible admission.    Disposition: Recommend psychiatric Inpatient admission    Alona Bene, PMHNP 08/11/2022, 12:24 PM

## 2022-08-11 NOTE — Consult Note (Signed)
Telepsych Consultation   Reason for Consult:  Command hallucinations and suicidal thoughts Referring Physician:  Dr. Wilkie Aye Location of Patient:  Location of Provider: Other: [facility]  Patient Identification: Jerry [name] MRN:  [id number] Principal Diagnosis: Schizoaffective disorder, bipolar type (HCC) Diagnosis:  Principal Problem:   Schizoaffective disorder, bipolar type (HCC)   Total Time spent with patient: 20 minutes  Subjective:   Jerry [name] is a 47 y.o. male patient admitted with acute suicidal ideation and command hallucinations after recent change in medications.  HPI:  Patient reports long history of schizoaffective disorder symptoms, which had been complicated by chronic alcohol dependence.  Patient went through extended detox about a month ago.  About that time, because of his history of tardive dyskinesia, he was also switched from his previous Risperdal to Abilify, because of his history of tardive dyskinesia, for which he takes Ingrezza.  Patient has remained sober since discharge, but he has had a steady increase in psychotic sx's, with the eventual onset of almost constant command hallucinations that are telling him that he is worthless and that he should kill himself.  Patient fears that he will not be able to control himself, so came to hospital for admission and stabiliation  Past Psychiatric History: Multi-year history of psychotic sx's, previously complicated by polysubstance abuse  Risk to Self:  Patient with active suicidal ideation and command hallucinations Risk to Others:  Patient denies any homicidal ideation Prior Inpatient Therapy: Multiple past admissions  Prior Outpatient Therapy:  Seeing outpatient providers   Past Medical History:  Past Medical History:  Diagnosis Date   Anxiety    Bipolar 1 disorder (HCC)    Depression    Drug-induced seizure (HCC)    EtOH dependence (HCC)    Hallucination    Paranoid schizophrenia (HCC)     since pt's 20's   PTSD (post-traumatic stress disorder)     Past Surgical History:  Procedure Laterality Date   KIDNEY DONATION Right 2003   Family History:  Family History  Problem Relation Age of Onset   Heart disease Mother    Cancer Mother    Heart disease Father    Diabetes Father    Diabetes Maternal Grandmother    Stroke Paternal Grandfather    Family Psychiatric  History: Aunt committed suicide; father with alcoholism; mother with chronic anxiety Social History:  Lives in hotel with his wife, son, and son's girlfriend.  Unemployed.  Social History   Substance and Sexual Activity  Alcohol Use Yes   Alcohol/week: 32.0 standard drinks of alcohol   Types: 12 Cans of beer, 20 Shots of liquor per week     Social History   Substance and Sexual Activity  Drug Use Yes   Types: Cocaine, Heroin, Amphetamines, Methamphetamines, Marijuana    Social History   Socioeconomic History   Marital status: Married    Spouse name: Not on file   Number of children: Not on file   Years of education: Not on file   Highest education level: Not on file  Occupational History   Not on file  Tobacco Use   Smoking status: Every Day    Packs/day: 2    Types: Cigarettes   Smokeless tobacco: Never  Vaping Use   Vaping Use: Never used  Substance and Sexual Activity   Alcohol use: Yes    Alcohol/week: 32.0 standard drinks of alcohol    Types: 12 Cans of beer, 20 Shots of liquor per week   Drug  use: Yes    Types: Cocaine, Heroin, Amphetamines, Methamphetamines, Marijuana   Sexual activity: Yes  Other Topics Concern   Not on file  Social History Narrative   Pt has been homeless for the past year and living outdoors for the past week; previously he has been living in hotels. Separated from wife. No family support.   Social Determinants of Health   Financial Resource Strain: Not on file  Food Insecurity: Food Insecurity Present (07/23/2022)   Hunger Vital Sign    Worried About Running  Out of Food in the Last Year: Often true    Ran Out of Food in the Last Year: Often true  Transportation Needs: Unmet Transportation Needs (07/23/2022)   PRAPARE - Administrator, Civil Service (Medical): Yes    Lack of Transportation (Non-Medical): Yes  Physical Activity: Not on file  Stress: Not on file  Social Connections: Not on file   Additional Social History:    Allergies:   Allergies  Allergen Reactions   Penicillins Swelling and Other (See Comments)    Reaction occurred in childhood   Suboxone [Buprenorphine Hcl-Naloxone Hcl] Other (See Comments)    "Made me feel terrible"    Labs:  Results for orders placed or performed during the hospital encounter of 08/10/22 (from the past 48 hour(s))  Comprehensive metabolic panel     Status: Abnormal   Collection Time: 08/10/22  5:52 PM  Result Value Ref Range   Sodium 133 (L) 135 - 145 mmol/L   Potassium 3.5 3.5 - 5.1 mmol/L   Chloride 99 98 - 111 mmol/L   CO2 26 22 - 32 mmol/L   Glucose, Bld 97 70 - 99 mg/dL    Comment: Glucose reference range applies only to samples taken after fasting for at least 8 hours.   BUN <5 (L) 6 - 20 mg/dL   Creatinine, Ser 1.61 0.61 - 1.24 mg/dL   Calcium 8.2 (L) 8.9 - 10.3 mg/dL   Total Protein 7.5 6.5 - 8.1 g/dL   Albumin 3.0 (L) 3.5 - 5.0 g/dL   AST 19 15 - 41 U/L   ALT 18 0 - 44 U/L   Alkaline Phosphatase 44 38 - 126 U/L   Total Bilirubin 0.8 0.3 - 1.2 mg/dL   GFR, Estimated >09 >60 mL/min    Comment: (NOTE) Calculated using the CKD-EPI Creatinine Equation (2021)    Anion gap 8 5 - 15    Comment: Performed at [facility], [address] [place], [place] [address]  Ethanol     Status: None   Collection Time: 08/10/22  5:52 PM  Result Value Ref Range   Alcohol, Ethyl (B) <10 <10 mg/dL    Comment: (NOTE) Lowest detectable limit for serum alcohol is 10 mg/dL.  For medical purposes only. Performed at [facility], [address], [place], [place] [address]   Salicylate level     Status: Abnormal   Collection Time: 08/10/22  5:52 PM  Result Value Ref Range   Salicylate Lvl <7.0 (L) 7.0 - 30.0 mg/dL    Comment: Performed at [facility], [address] [place], [place] [address]  Acetaminophen level     Status: Abnormal   Collection Time: 08/10/22  5:52 PM  Result Value Ref Range   Acetaminophen (Tylenol), Serum <10 (L) 10 - 30 ug/mL    Comment: (NOTE) Therapeutic concentrations vary significantly. A range of 10-30 ug/mL  may be an effective concentration  for many patients. However, some  are best treated at concentrations outside of this range. Acetaminophen concentrations >150 ug/mL at 4 hours after ingestion  and >50 ug/mL at 12 hours after ingestion are often associated with  toxic reactions.  Performed at [facility], [address] [place], [place] [address]   cbc     Status: Abnormal   Collection Time: 08/10/22  5:52 PM  Result Value Ref Range   WBC 6.5 4.0 - 10.5 K/uL   RBC 3.56 (L) 4.22 - 5.81 MIL/uL   Hemoglobin 11.6 (L) 13.0 - 17.0 g/dL   HCT 60.4 (L) 54.0 - 98.1 %   MCV 99.4 80.0 - 100.0 fL   MCH 32.6 26.0 - 34.0 pg   MCHC 32.8 30.0 - 36.0 g/dL   RDW 19.1 47.8 - 29.5 %   Platelets 339 150 - 400 K/uL   nRBC 0.0 0.0 - 0.2 %    Comment: Performed at [facility], [address], [place], [place] [address]  Rapid urine drug screen (hospital performed)     Status: Abnormal   Collection Time: 08/10/22 10:24 PM  Result Value Ref Range   Opiates NONE DETECTED NONE DETECTED   Cocaine NONE DETECTED NONE DETECTED   Benzodiazepines POSITIVE (A) NONE DETECTED   Amphetamines NONE DETECTED NONE DETECTED   Tetrahydrocannabinol NONE DETECTED NONE DETECTED   Barbiturates NONE DETECTED NONE DETECTED    Comment: (NOTE) DRUG SCREEN FOR MEDICAL PURPOSES ONLY.  IF CONFIRMATION IS NEEDED FOR ANY PURPOSE, NOTIFY LAB WITHIN 5 DAYS.  LOWEST  DETECTABLE LIMITS FOR URINE DRUG SCREEN Drug Class                     Cutoff (ng/mL) Amphetamine and metabolites    1000 Barbiturate and metabolites    200 Benzodiazepine                 200 Opiates and metabolites        300 Cocaine and metabolites        300 THC                            50 Performed at [facility], [address], [place], [place] [address]     Medications:  Current Facility-Administered Medications  Medication Dose Route Frequency Provider Last Rate Last Admin   ARIPiprazole (ABILIFY) tablet 5 mg  5 mg Oral Daily Melene Plan, DO       busPIRone (BUSPAR) tablet 10 mg  10 mg Oral BID Melene Plan, DO   10 mg at 08/10/22 2227   folic acid (FOLVITE) tablet 1 mg  1 mg Oral Daily Melene Plan, DO   1 mg at 08/10/22 2227   gabapentin (NEURONTIN) capsule 400 mg  400 mg Oral BID Melene Plan, DO   400 mg at 08/10/22 2227   magnesium oxide (MAG-OX) tablet 400 mg  400 mg Oral Daily Melene Plan, DO   400 mg at 08/10/22 2227   multivitamin with minerals tablet 1 tablet  1 tablet Oral Daily Melene Plan, DO   1 tablet at 08/10/22 2227   nicotine (NICODERM CQ - dosed in mg/24 hours) patch 21 mg  21 mg Transdermal Q0600 Melene Plan, DO       sertraline (ZOLOFT) tablet 50 mg  50 mg Oral Daily Melene Plan, DO   50 mg at 08/10/22 2227   thiamine (VITAMIN B1) tablet  100 mg  100 mg Oral Daily Melene Plan, DO   100 mg at 08/10/22 2227   traZODone (DESYREL) tablet 50 mg  50 mg Oral QHS PRN Melene Plan, DO   50 mg at 08/10/22 2227   Current Outpatient Medications  Medication Sig Dispense Refill   ARIPiprazole (ABILIFY) 5 MG tablet Take 1 tablet (5 mg total) by mouth daily. 30 tablet 1   busPIRone (BUSPAR) 10 MG tablet Take 1 tablet (10 mg total) by mouth 2 (two) times daily. 60 tablet 0   gabapentin (NEURONTIN) 400 MG capsule Take 1 capsule (400 mg total) by mouth 2 (two) times daily. 60 capsule 0   Nicotine (NICODERM CQ TD) Place 1 patch onto the skin daily as needed (for  smoking cessation while hospitalized).     sertraline (ZOLOFT) 50 MG tablet Take 1 tablet (50 mg total) by mouth daily. 30 tablet 1   traZODone (DESYREL) 50 MG tablet Take 1 tablet (50 mg total) by mouth at bedtime as needed for sleep. 30 tablet 1   folic acid (FOLVITE) 1 MG tablet Take 1 tablet (1 mg total) by mouth daily. (Patient not taking: Reported on 08/10/2022)     Magnesium 200 MG TABS Take 2 tablets (400 mg total) by mouth daily. (Patient not taking: Reported on 08/10/2022) 30 tablet    Multiple Vitamin (MULTIVITAMIN WITH MINERALS) TABS tablet Take 1 tablet by mouth daily. (Patient not taking: Reported on 08/10/2022)     nicotine (NICODERM CQ - DOSED IN MG/24 HOURS) 21 mg/24hr patch Place 1 patch (21 mg total) onto the skin daily at 6 (six) AM. (Patient not taking: Reported on 08/10/2022) 28 patch 0   potassium chloride SA (KLOR-CON M) 20 MEQ tablet Take 2 tablets (40 mEq total) by mouth daily for 2 days. (Patient not taking: Reported on 08/10/2022)     thiamine (VITAMIN B-1) 100 MG tablet Take 1 tablet (100 mg total) by mouth daily. (Patient not taking: Reported on 08/10/2022) 30 tablet 0   valbenazine (INGREZZA) 40 MG capsule Take 1 capsule (40 mg total) by mouth daily. (Patient not taking: Reported on 08/10/2022) 30 capsule 1    Musculoskeletal: Strength & Muscle Tone: within normal limits.  Patient reports tongue thrusting Gait & Station:  Lying down throughout interview Patient leans:  Lying down throughout interview          Psychiatric Specialty Exam:  Presentation  General Appearance:  Disheveled  Eye Contact: Good  Speech: Clear and Coherent  Speech Volume: Normal  Handedness: Right   Mood and Affect  Mood: Anxious; Dysphoric  Affect: Congruent   Thought Process  Thought Processes: Coherent  Descriptions of Associations:Circumstantial  Orientation:Full (Time, Place and Person)  Thought Content:Scattered; Paranoid Ideation  History of  Schizophrenia/Schizoaffective disorder:Yes  Duration of Psychotic Symptoms:Greater than six months  Hallucinations:Hallucinations: Command; Auditory Description of Command Hallucinations: Voices telling him that he is worthless and needs to die Description of Auditory Hallucinations: Same  Ideas of Reference:Paranoia  Suicidal Thoughts:Suicidal Thoughts: Yes, Active SI Active Intent and/or Plan: With Intent  Homicidal Thoughts:Homicidal Thoughts: No   Sensorium  Memory: Immediate Fair; Recent Fair; Remote Fair  Judgment: Fair  Insight: Fair   Chartered certified accountant: Fair  Attention Span: Poor  Recall: [employer] of Knowledge: Fair  Language: Fair   Psychomotor Activity  Psychomotor Activity: Psychomotor Activity: Restlessness (tongue movements/tardive dyskinesia) Extrapyramidal Side Effects (EPS): Tardive Dyskinesia AIMS Completed?: No   Assets  Assets: Desire for Improvement; Housing  Sleep  Sleep: Sleep: Fair    Physical Exam: Physical Exam Constitutional:      Appearance: Normal appearance.  Pulmonary:     Effort: Pulmonary effort is normal.  Neurological:     Mental Status: He is alert. Mental status is at baseline.    Review of Systems  Constitutional: Negative.   Respiratory: Negative.    Gastrointestinal: Negative.   Psychiatric/Behavioral:  Positive for hallucinations and suicidal ideas. The patient is nervous/anxious.    Blood pressure 103/88, pulse 69, temperature 99.5 F (37.5 C), temperature source Oral, resp. rate 16, height 5\' 9"  (1.753 m), weight 88 kg, SpO2 98 %. Body mass index is 28.65 kg/m.  Treatment Plan Summary: Daily contact with patient to assess and evaluate symptoms and progress in treatment, Medication management, and Plan Patient is actively suicidal with command hallucinations, and he requires emergency admission to hospital for stabilization and treatment.  He needs to continue on the  following medications: , pending admission:   Abilify 5 mg qd for mood control.  For now, continue this dosage, but will need to be examined in inpatient service vis-a-vis risks/benefits (with TD) Neurontin, 400 mg bid for anxiety and alcohol withdrawal Zoloft, 50 mg qd for depression/anxiety Ingrezza, 40 mg qd for tardive dyskinesia Trazodone, 50 mg qhs PRN sleep.  Disposition: Recommend psychiatric Inpatient admission when medically cleared. Supportive therapy provided about ongoing stressors.  Patient should be on 1:1 precautions until can be safely transferred to Psychiatry.  This service was provided via telemedicine using a 2-way, interactive audio and video technology.  Names of all persons participating in this telemedicine service and their role in this encounter. Name: Margaretmary Lombard, MD, JD Role: Psychiatrist  Name:  Role:   Name:  Role:   Name:  Role:     Ezekiel Slocumb, MD 08/11/2022 4:57 AM

## 2022-08-11 NOTE — ED Provider Notes (Signed)
Emergency Medicine Observation Re-evaluation Note  Jerry [name] is a 47 y.o. male, seen on rounds today.  Pt initially presented to the ED for complaints of Suicidal Currently, the patient is accepted at [facility] for admission.  Physical Exam  BP 115/77 (BP Location: Right Arm)   Pulse (!) 56   Temp 98.6 F (37 C) (Oral)   Resp 18   Ht 5\' 9"  (1.753 m)   Wt 88 kg   SpO2 93%   BMI 28.65 kg/m  Physical Exam General: resting. Awakens to light voice Lungs: respirations calm, nonlabored Psych: calm and interactive  ED Course / MDM  EKG:   I have reviewed the labs performed to date as well as medications administered while in observation.  Recent changes in the last 24 hours include none.  Plan  Current plan is for transfer to [facility]. Patient has no complaints at this time.  Denies shortness of breath or chest pain.  He awakens to light voice and is alert.  EMTALA completed.    Arby Barrette, MD 08/11/22 1705

## 2022-08-11 NOTE — ED Provider Notes (Signed)
Emergency Medicine Observation Re-evaluation Note  Jerry [name] is a 47 y.o. male, seen on rounds today.  Pt initially presented to the ED for complaints of Suicidal  Patient has schizoaffective disorder as well as chronic alcohol dependence. Currently, the patient is resting comfortably.  Physical Exam  BP 116/85 (BP Location: Right Arm)   Pulse 68   Temp 99.2 F (37.3 C) (Oral)   Resp 15   Ht 5\' 9"  (1.753 m)   Wt 88 kg   SpO2 92%   BMI 28.65 kg/m  Physical Exam General: Resting comfortably Cardiac: Regular rate Lungs: Breathing comfortably Psych: Calm  ED Course / MDM  EKG:   I have reviewed the labs performed to date as well as medications administered while in observation.  Recent changes in the last 24 hours include no acute changes.  Patient medically cleared..  Plan  Current plan is for psychiatric inpatient admission.    Mardene Sayer, MD 08/11/22 [phone]

## 2022-08-24 ENCOUNTER — Ambulatory Visit (INDEPENDENT_AMBULATORY_CARE_PROVIDER_SITE_OTHER): Payer: Medicaid Other | Admitting: Psychiatry

## 2022-08-24 ENCOUNTER — Encounter (HOSPITAL_COMMUNITY): Payer: Self-pay | Admitting: Psychiatry

## 2022-08-24 ENCOUNTER — Other Ambulatory Visit (HOSPITAL_COMMUNITY): Payer: Self-pay

## 2022-08-24 VITALS — BP 118/78 | HR 75 | Wt 194.4 lb

## 2022-08-24 DIAGNOSIS — F29 Unspecified psychosis not due to a substance or known physiological condition: Secondary | ICD-10-CM | POA: Diagnosis not present

## 2022-08-24 DIAGNOSIS — F411 Generalized anxiety disorder: Secondary | ICD-10-CM

## 2022-08-24 DIAGNOSIS — F1910 Other psychoactive substance abuse, uncomplicated: Secondary | ICD-10-CM

## 2022-08-24 DIAGNOSIS — F4310 Post-traumatic stress disorder, unspecified: Secondary | ICD-10-CM

## 2022-08-24 DIAGNOSIS — G2401 Drug induced subacute dyskinesia: Secondary | ICD-10-CM

## 2022-08-24 DIAGNOSIS — F1090 Alcohol use, unspecified, uncomplicated: Secondary | ICD-10-CM

## 2022-08-24 MED ORDER — SERTRALINE HCL 50 MG PO TABS
50.0000 mg | ORAL_TABLET | Freq: Every day | ORAL | 1 refills | Status: DC
Start: 2022-08-24 — End: 2022-10-24
  Filled 2022-08-24: qty 30, 30d supply, fill #0
  Filled 2022-09-26: qty 30, 30d supply, fill #1

## 2022-08-24 MED ORDER — BUSPIRONE HCL 10 MG PO TABS
10.0000 mg | ORAL_TABLET | Freq: Two times a day (BID) | ORAL | 1 refills | Status: DC
Start: 2022-08-24 — End: 2022-10-24
  Filled 2022-08-24: qty 60, 30d supply, fill #0
  Filled 2022-09-26: qty 60, 30d supply, fill #1

## 2022-08-24 MED ORDER — GABAPENTIN 400 MG PO CAPS
400.0000 mg | ORAL_CAPSULE | Freq: Two times a day (BID) | ORAL | 1 refills | Status: DC
Start: 2022-08-24 — End: 2022-10-24
  Filled 2022-08-24: qty 60, 30d supply, fill #0
  Filled 2022-09-26: qty 60, 30d supply, fill #1

## 2022-08-24 MED ORDER — ARIPIPRAZOLE 5 MG PO TABS
5.0000 mg | ORAL_TABLET | Freq: Every day | ORAL | 1 refills | Status: DC
Start: 2022-08-24 — End: 2022-10-24
  Filled 2022-08-24: qty 30, 30d supply, fill #0
  Filled 2022-09-26: qty 30, 30d supply, fill #1

## 2022-08-24 MED ORDER — TRAZODONE HCL 50 MG PO TABS
50.0000 mg | ORAL_TABLET | Freq: Every evening | ORAL | 1 refills | Status: DC | PRN
Start: 2022-08-24 — End: 2022-10-24
  Filled 2022-08-24: qty 30, 30d supply, fill #0
  Filled 2022-09-26: qty 30, 30d supply, fill #1

## 2022-08-24 NOTE — Progress Notes (Signed)
BH MD/PA/NP OP Progress Note  08/24/2022 5:12 PM Jerry [name]  MRN:  [id number]  Chief Complaint: follow up   HPI: 47 y.o.  male seen today for follow up psychiatric evaluation.  He has a psychiatric history of  schizophrenia spectrum disorder, GAD, substance induced mood disorder, polysubstance abuse (alcohol, tobacco, methamphetamine, marijuana, and cocaine in remission), PTSD, bipolar I, homelessness, alcohol abuse and malingering.  .   Pt reports that his mood is "stable ". Patient states that he had an episode 2 months ago when he was feeling paranoid.  He stopped taking his medications and started using multiple drugs and drinking alcohol heavily.  He was having issues with his wife and was living on the streets for a month.  He reports that he was admitted to [facility] for 10 days where his medications were changed.  They stopped his risperidone and started him on Abilify which he has been tolerating well.  He reports that he has been sober for 2 months and has not used any kind of drugs or alcohol at all.  He has been taking all his medications regularly.    Currently, he denies active or passive SI, HI, AVH and paranoia.  He reports current stressor as his 74 year old son who has schizophrenia and uses methamphetamine and other drugs.  His son often destroys hotel property and then he has to pay.  He wants to restart Ingrezza for his tongue movements.  He was given 2 weeks of samples of Ingrezza at last visit and the nurse tried to connect him to patient assistance program but he was not reachable at that time.  Will ask staff to find out if he qualifies wise for patient assistance program .   On exam, tardive dyskinesia involving movement of mouth and tongue observed.  No tremors or stiffness.  AIMS done scored at 10  He is currently living in a motel with his wife, 20 year old son and son's girlfriend.   Discussed restarting Ingrezza to help with tardive dyskinesias. Blood work ordered  for clinic.  He will come next week to get blood work done.   Patient is alert and oriented x 4, calm , cooperative, and fully engaged in conversation during the encounter.  His thought process is coherent with coherent speech . He does not appear to be responding to internal/external stimuli .  Visit Diagnosis:    ICD-10-CM   1. PTSD (post-traumatic stress disorder)  F43.10 sertraline (ZOLOFT) 50 MG tablet    2. Generalized anxiety disorder  F41.1 sertraline (ZOLOFT) 50 MG tablet    traZODone (DESYREL) 50 MG tablet    busPIRone (BUSPAR) 10 MG tablet    gabapentin (NEURONTIN) 400 MG capsule    3. Schizophrenia spectrum disorder with psychotic disorder type not yet determined (HCC)  F29 traZODone (DESYREL) 50 MG tablet    ARIPiprazole (ABILIFY) 5 MG tablet    4. Tardive dyskinesia  G24.01     5. Alcohol use disorder  F10.90 Vitamin B12    Folate    6. Polysubstance abuse (HCC)  F19.10       Past Psychiatric History: Bipolar disorder, PTSD, depression, anxiety, schizophrenia spectrum disorder, substance-induced mood disorder (marijuana, cocaine, tobacco, methamphetamines,) and malingering  Past Medical History:  Past Medical History:  Diagnosis Date   Anxiety    Bipolar 1 disorder (HCC)    Depression    Drug-induced seizure (HCC)    EtOH dependence (HCC)    Hallucination    Paranoid schizophrenia (HCC)  since pt's 20's   PTSD (post-traumatic stress disorder)     Past Surgical History:  Procedure Laterality Date   KIDNEY DONATION Right 2003    Family Psychiatric History: Mother substance use, son substance use,two sons cutting behaviors  Family History:  Family History  Problem Relation Age of Onset   Heart disease Mother    Cancer Mother    Heart disease Father    Diabetes Father    Diabetes Maternal Grandmother    Stroke Paternal Grandfather     Social History:  Social History   Socioeconomic History   Marital status: Married    Spouse name: Not on file    Number of children: Not on file   Years of education: Not on file   Highest education level: Not on file  Occupational History   Not on file  Tobacco Use   Smoking status: Every Day    Packs/day: 2    Types: Cigarettes   Smokeless tobacco: Never  Vaping Use   Vaping Use: Never used  Substance and Sexual Activity   Alcohol use: Not Currently    Comment: Decreased from 12 cans of beer and 20 shots of liquor about 2 months ago   Drug use: Not Currently    Types: Cocaine, Heroin, Amphetamines, Methamphetamines, Marijuana   Sexual activity: Yes  Other Topics Concern   Not on file  Social History Narrative   Pt has been homeless for the past year and living outdoors for the past week; previously he has been living in hotels. Separated from wife. No family support.   Social Determinants of Health   Financial Resource Strain: Not on file  Food Insecurity: Food Insecurity Present (07/23/2022)   Hunger Vital Sign    Worried About Running Out of Food in the Last Year: Often true    Ran Out of Food in the Last Year: Often true  Transportation Needs: Unmet Transportation Needs (07/23/2022)   PRAPARE - Administrator, Civil Service (Medical): Yes    Lack of Transportation (Non-Medical): Yes  Physical Activity: Not on file  Stress: Not on file  Social Connections: Not on file    Allergies:  Allergies  Allergen Reactions   Penicillins Swelling and Other (See Comments)    Reaction occurred in childhood   Suboxone [Buprenorphine Hcl-Naloxone Hcl] Other (See Comments)    "Made me feel terrible"    Metabolic Disorder Labs: Lab Results  Component Value Date   HGBA1C 5.3 07/09/2022   MPG 105.41 07/09/2022   MPG 111.15 10/31/2020   No results found for: "PROLACTIN" Lab Results  Component Value Date   CHOL 162 07/09/2022   TRIG 86 07/09/2022   HDL 67 07/09/2022   CHOLHDL 2.4 07/09/2022   VLDL 17 07/09/2022   LDLCALC 78 07/09/2022   LDLCALC 82 10/31/2020   Lab  Results  Component Value Date   TSH 2.179 07/09/2022   TSH 0.751 07/17/2020    Therapeutic Level Labs: No results found for: "LITHIUM" No results found for: "VALPROATE" No results found for: "CBMZ"  Current Medications: Current Outpatient Medications  Medication Sig Dispense Refill   ARIPiprazole (ABILIFY) 5 MG tablet Take 1 tablet (5 mg total) by mouth daily. 30 tablet 1   busPIRone (BUSPAR) 10 MG tablet Take 1 tablet (10 mg total) by mouth 2 (two) times daily. 60 tablet 1   folic acid (FOLVITE) 1 MG tablet Take 1 tablet (1 mg total) by mouth daily. (Patient not taking: Reported on 08/10/2022)  gabapentin (NEURONTIN) 400 MG capsule Take 1 capsule (400 mg total) by mouth 2 (two) times daily. 60 capsule 1   Magnesium 200 MG TABS Take 2 tablets (400 mg total) by mouth daily. (Patient not taking: Reported on 08/10/2022) 30 tablet    Multiple Vitamin (MULTIVITAMIN WITH MINERALS) TABS tablet Take 1 tablet by mouth daily. (Patient not taking: Reported on 08/10/2022)     Nicotine (NICODERM CQ TD) Place 1 patch onto the skin daily as needed (for smoking cessation while hospitalized).     sertraline (ZOLOFT) 50 MG tablet Take 1 tablet (50 mg total) by mouth daily. 30 tablet 1   thiamine (VITAMIN B-1) 100 MG tablet Take 1 tablet (100 mg total) by mouth daily. (Patient not taking: Reported on 08/10/2022) 30 tablet 0   traZODone (DESYREL) 50 MG tablet Take 1 tablet (50 mg total) by mouth at bedtime as needed for sleep. 30 tablet 1   valbenazine (INGREZZA) 40 MG capsule Take 1 capsule (40 mg total) by mouth daily. (Patient not taking: Reported on 08/10/2022) 30 capsule 1   No current facility-administered medications for this visit.     Musculoskeletal: Strength & Muscle Tone: normal Gait & Station: normal Patient leans: N/A  Psychiatric Specialty Exam: Review of Systems  Blood pressure 118/78, pulse 75, weight 194 lb 6.4 oz (88.2 kg), SpO2 98 %.Body mass index is 28.71 kg/m.  General  Appearance: Casual  Eye Contact: Fair  Speech:  Clear and Coherent and Normal Rate  Volume:  Normal  Mood:  Anxious  Affect:  Congruent and Constricted  Thought Process:  Coherent, Goal Directed, and Linear  Orientation:  Full (Time, Place, and Person)  Thought Content: Paranoid Ideation   Suicidal Thoughts:  No  Homicidal Thoughts:  No  Memory: Grossly intact  Judgement:  Fair  Insight:  Fair  Psychomotor Activity: normal, no tremors  Concentration:  Concentration: Good and Attention Span: Good  Recall: N/A  Fund of Knowledge: Good  Language: Good  Akathisia:   none  but TD's present in tongue and lips  Handed:    AIMS (if indicated): done, 10  Assets:  Communication Skills Desire for Improvement Financial Resources/Insurance Housing Physical Health Social Support  ADL's:  Intact  Cognition: WNL  Sleep:  Fair   Screenings: AIMS    Flowsheet Row Clinical Support [facility]19/2022 in [facility]) [facility]) [facility]  AIMS Total Score 10 0 0      AUDIT    Flowsheet Row [facility]) [facility]  Alcohol Use Disorder Identification Test Final Score (AUDIT) 31 0      GAD-7    Flowsheet Row Video Visit from 01/26/2022 in [facility] Video Visit from 11/01/2021 in [facility] Video Visit from [facility]/22/2021 in [facility] Office Visit from 12/21/2020 in [facility]  Total GAD-7 Score 17 18 20 19       PHQ2-9    Flowsheet Row ED from 07/09/2022 in [facility] Video Visit from 01/26/2022 in [facility] Video Visit from 11/01/2021 in [facility] Video Visit from 03/22/2021 in [facility] from 02/24/2021 in [facility]  PHQ-2 Total Score 0 2 2 5 5   PHQ-9 Total Score 22 8 14 17 16       Flowsheet Row ED [facility] 08/10/20[facility] in [facility] ED to Hosp-Admission (Discharged) from 07/22/2022 in [facility] from 07/15/2022 in [facility] Department at [facility]  C-SSRS RISK CATEGORY High Risk High Risk High Risk        Assessment and Plan: 47 y.o.  male seen today for follow up psychiatric evaluation.  He has a psychiatric history of  schizophrenia spectrum disorder, GAD, substance induced mood disorder, polysubstance abuse (alcohol, tobacco, methamphetamine, marijuana, and cocaine in remission), PTSD, bipolar I, homelessness, alcohol abuse and malingering.  Patient had paranoid episode 2 months ago when he stopped his medication and started using multiple drugs and drinking alcohol.  He was admitted to [facility] at that time where his medications were changed.  He was discharged on Abilify 5 mg daily, BuSpar 10 mg twice daily, Zoloft 50 mg daily, trazodone 50 mg nightly, gabapentin 400 mg twice daily. He reports stability of his symptoms with current medications and denies any depression, anxiety, SI, HI, AVH and paranoia.  He has been tolerating his medications well without any side effects.  He has been sober for 2 months and has not used any drugs or alcohol.  He wants to restart Ingrezza for tardive dyskinesia.  Will send a staff message to nurse who handles patient assistance for Ingrezza.  Will order vitamin B12 and folate level as patient abusing alcohol.  Recommend continued cessation of illicit drugs.  Patient is on multiple serotonergic medications on low-dose.  Will try to consolidate and titrate  Zoloft and stop BuSpar in future but will not make changes at this time as these medications were started recently and patient's mood is stable. 1. Generalized anxiety disorder 2. PTSD (post-traumatic stress disorder) -Continue sertraline (ZOLOFT) 50 MG tablet; Take 1 tablet (50 mg total) by mouth daily.  Dispense: 30 tablet; Refill: 1 -Continue traZODone (DESYREL) 50 MG tablet; Take 1 tablet (50 mg total) by mouth at bedtime as needed for sleep.  Dispense: 30 tablet; Refill: 1 -Continue busPIRone (BUSPAR) 10 MG tablet; Take 1 tablet (10 mg total) by mouth 2 (two) times daily.  Dispense: 60 tablet; Refill: 1 -Continue gabapentin (NEURONTIN) 400 MG capsule; Take 1 capsule (400 mg total) by mouth 2 (two) times daily.  Dispense: 60 capsule; Refill: 1  3. Schizophrenia spectrum disorder with psychotic disorder type not yet determined (HCC)  -Continue traZODone (DESYREL) 50 MG tablet; Take 1 tablet (50 mg total) by mouth at bedtime as needed for sleep.  Dispense: 30 tablet; Refill: 1 -Continue ARIPiprazole (ABILIFY) 5 MG tablet; Take 1 tablet (5 mg total) by mouth daily.  Dispense: 30 tablet; Refill: 1  4. Tardive dyskinesia -Restart Ingrezza 40 mg daily., Staff message sent for enrolling pt to patient assistance program.   5. Alcohol use disorder 6. Polysubstance use (Has been sober for 2 months) -Recommend continued cessation  -Ordered Vitamin B12, and folate  Follow-up in person in 2 months  Karsten Ro, MD 08/24/2022, 5:12 PM

## 2022-08-25 ENCOUNTER — Other Ambulatory Visit (HOSPITAL_COMMUNITY): Payer: Self-pay

## 2022-09-04 ENCOUNTER — Encounter (HOSPITAL_COMMUNITY): Payer: Self-pay

## 2022-09-04 ENCOUNTER — Other Ambulatory Visit (HOSPITAL_COMMUNITY): Payer: Self-pay

## 2022-09-04 ENCOUNTER — Other Ambulatory Visit (HOSPITAL_COMMUNITY): Payer: Self-pay | Admitting: Psychiatry

## 2022-09-04 ENCOUNTER — Other Ambulatory Visit (HOSPITAL_COMMUNITY): Payer: No Payment, Other

## 2022-09-04 DIAGNOSIS — G2401 Drug induced subacute dyskinesia: Secondary | ICD-10-CM

## 2022-09-04 MED ORDER — VALBENAZINE TOSYLATE 40 MG PO CAPS
40.0000 mg | ORAL_CAPSULE | Freq: Every day | ORAL | 3 refills | Status: DC
Start: 2022-09-04 — End: 2022-09-19
  Filled 2022-09-04: qty 30, 30d supply, fill #0

## 2022-09-04 NOTE — Telephone Encounter (Signed)
Patient came into the clinic today with symptoms of TD. He has abnormal movements of his mouth and jaw.  He informed Clinical research associate that he has not received his Ingrezza as of yet.  Dr. Leone Haven placed a prescription for Ingrezza 40 mg however patient that is not available at his pharmacy.  Today provider sent Ingrezza 40 mg to preferred pharmacy.  He was given a one week sample of the medication. He will follow-up with Dr. Jerene Dilling on 10/24/2022 for further evaluation.

## 2022-09-05 ENCOUNTER — Other Ambulatory Visit (HOSPITAL_COMMUNITY): Payer: Self-pay | Admitting: Psychiatry

## 2022-09-06 ENCOUNTER — Other Ambulatory Visit (HOSPITAL_COMMUNITY): Payer: Self-pay

## 2022-09-06 ENCOUNTER — Ambulatory Visit: Payer: Self-pay | Admitting: Nurse Practitioner

## 2022-09-06 ENCOUNTER — Other Ambulatory Visit: Payer: Self-pay

## 2022-09-06 LAB — VITAMIN B12: Vitamin B-12: 299 pg/mL (ref 232–1245)

## 2022-09-06 LAB — FOLATE: Folate: 6.4 ng/mL (ref >3.0)

## 2022-09-07 ENCOUNTER — Telehealth (HOSPITAL_COMMUNITY): Payer: Self-pay | Admitting: *Deleted

## 2022-09-07 NOTE — Telephone Encounter (Signed)
Patient called asking what can be done to get his Ingrezza. He was given samples during his last visit but he doesn't have anymore. Note per Doda states that patient assistance would be attempted, however patient has medicaid. Will need a prior authorization. Asking if samples can be given in the meantime. States that his mouth moving constantly is very embarrassing.

## 2022-09-11 ENCOUNTER — Other Ambulatory Visit (HOSPITAL_COMMUNITY): Payer: Self-pay

## 2022-09-19 ENCOUNTER — Telehealth (HOSPITAL_COMMUNITY): Payer: Self-pay | Admitting: *Deleted

## 2022-09-19 ENCOUNTER — Other Ambulatory Visit (HOSPITAL_COMMUNITY): Payer: Self-pay

## 2022-09-19 ENCOUNTER — Other Ambulatory Visit (HOSPITAL_COMMUNITY): Payer: Self-pay | Admitting: Psychiatry

## 2022-09-19 DIAGNOSIS — G2401 Drug induced subacute dyskinesia: Secondary | ICD-10-CM

## 2022-09-19 MED ORDER — VALBENAZINE TOSYLATE 40 MG PO CAPS: 40.0000 mg | ORAL_CAPSULE | Freq: Every day | ORAL | 1 refills | Status: DC

## 2022-09-19 NOTE — Telephone Encounter (Signed)
Patient called again stating that he needs samples of Ingrezza until he can get approval for the medication. His mouth continues to move uncontrollably and it is very embarrassing for him.

## 2022-09-19 NOTE — Telephone Encounter (Signed)
Notified regarding patient's symptoms of tardive dyskinesia. Patient was formally seen by Dr. Leone Haven. Sent Ingrezza to [facility] given [employer] and specialty nature of medication.   -Park Pope, MD PGY3 Psychiatry Resident

## 2022-09-19 NOTE — Telephone Encounter (Signed)
Thank you for this update it seems like Dr. Hazle Quant has sent it to the preferred pharmacy.

## 2022-09-19 NOTE — Telephone Encounter (Signed)
Patient arrived at front desk asking for samples of Ingrezza. Dr. Hazle Quant agreed to give 7 day supply of Ingrezza 40mg . Called patients' pharmacy to discuss medication authorization. Filled out [place] movement disorder form and faxed for approval.

## 2022-09-20 ENCOUNTER — Telehealth (HOSPITAL_COMMUNITY): Payer: Self-pay | Admitting: *Deleted

## 2022-09-20 NOTE — Telephone Encounter (Signed)
Submitted prior authorization for Ingrezza on cover my meds. Awaiting a decision.

## 2022-09-26 ENCOUNTER — Other Ambulatory Visit (HOSPITAL_COMMUNITY): Payer: Self-pay

## 2022-09-27 ENCOUNTER — Telehealth (HOSPITAL_COMMUNITY): Payer: Self-pay | Admitting: Student

## 2022-09-27 ENCOUNTER — Telehealth (HOSPITAL_COMMUNITY): Payer: Self-pay | Admitting: *Deleted

## 2022-09-27 NOTE — Telephone Encounter (Signed)
Patient called for samples of Ingrezza, looked up his authorization that was submitted on cover my meds and it was approved until 03/24/2023 #[id number]. Gave him the name of the pharmacy it was sent to and he will call today to have it filled. Notified pharmacy as well of approval. No other issues or concerns at this time.

## 2022-09-28 ENCOUNTER — Ambulatory Visit: Payer: Self-pay | Admitting: Nurse Practitioner

## 2022-09-28 ENCOUNTER — Other Ambulatory Visit (HOSPITAL_COMMUNITY): Payer: Self-pay

## 2022-10-03 ENCOUNTER — Other Ambulatory Visit (HOSPITAL_COMMUNITY): Payer: Self-pay

## 2022-10-09 ENCOUNTER — Other Ambulatory Visit (HOSPITAL_COMMUNITY): Payer: Self-pay
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
## 2022-10-24 ENCOUNTER — Ambulatory Visit (INDEPENDENT_AMBULATORY_CARE_PROVIDER_SITE_OTHER): Payer: MEDICAID | Admitting: Student

## 2022-10-24 ENCOUNTER — Other Ambulatory Visit (HOSPITAL_COMMUNITY): Payer: Self-pay

## 2022-10-24 VITALS — BP 135/78 | Wt 213.0 lb

## 2022-10-24 DIAGNOSIS — F29 Unspecified psychosis not due to a substance or known physiological condition: Secondary | ICD-10-CM | POA: Diagnosis not present

## 2022-10-24 DIAGNOSIS — F4310 Post-traumatic stress disorder, unspecified: Secondary | ICD-10-CM | POA: Diagnosis not present

## 2022-10-24 DIAGNOSIS — Z0000 Encounter for general adult medical examination without abnormal findings: Secondary | ICD-10-CM | POA: Insufficient documentation

## 2022-10-24 DIAGNOSIS — G2401 Drug induced subacute dyskinesia: Secondary | ICD-10-CM | POA: Diagnosis not present

## 2022-10-24 DIAGNOSIS — F411 Generalized anxiety disorder: Secondary | ICD-10-CM

## 2022-10-24 MED ORDER — ARIPIPRAZOLE 5 MG PO TABS
5.0000 mg | ORAL_TABLET | Freq: Every day | ORAL | 1 refills | Status: DC
Start: 2022-10-24 — End: 2022-11-24
  Filled 2022-10-24: qty 30, 30d supply, fill #0

## 2022-10-24 MED ORDER — SERTRALINE HCL 100 MG PO TABS
100.0000 mg | ORAL_TABLET | Freq: Every day | ORAL | 1 refills | Status: DC
Start: 2022-10-24 — End: 2022-11-24
  Filled 2022-10-24: qty 30, 30d supply, fill #0

## 2022-10-24 MED ORDER — PRAZOSIN HCL 1 MG PO CAPS
ORAL_CAPSULE | ORAL | 0 refills | Status: DC
  Filled 2022-10-24 (×2): qty 63, 35d supply, fill #0

## 2022-10-24 MED ORDER — GABAPENTIN 400 MG PO CAPS
400.0000 mg | ORAL_CAPSULE | Freq: Two times a day (BID) | ORAL | 1 refills | Status: DC
Start: 2022-10-24 — End: 2022-11-24
  Filled 2022-10-24: qty 60, 30d supply, fill #0

## 2022-10-24 MED ORDER — VALBENAZINE TOSYLATE 60 MG PO CAPS
60.0000 mg | ORAL_CAPSULE | Freq: Every day | ORAL | 0 refills | Status: AC
Start: 2022-10-24 — End: 2022-11-23

## 2022-10-24 MED ORDER — BUSPIRONE HCL 10 MG PO TABS
10.0000 mg | ORAL_TABLET | Freq: Two times a day (BID) | ORAL | 1 refills | Status: DC
Start: 2022-10-24 — End: 2022-11-24
  Filled 2022-10-24: qty 60, 30d supply, fill #0

## 2022-11-24 ENCOUNTER — Ambulatory Visit (HOSPITAL_COMMUNITY): Payer: No Payment, Other | Admitting: Student

## 2022-11-24 ENCOUNTER — Other Ambulatory Visit (HOSPITAL_COMMUNITY): Payer: Self-pay

## 2022-11-24 VITALS — BP 122/77

## 2022-11-24 DIAGNOSIS — G2401 Drug induced subacute dyskinesia: Secondary | ICD-10-CM

## 2022-11-24 DIAGNOSIS — F19959 Other psychoactive substance use, unspecified with psychoactive substance-induced psychotic disorder, unspecified: Secondary | ICD-10-CM

## 2022-11-24 DIAGNOSIS — F4310 Post-traumatic stress disorder, unspecified: Secondary | ICD-10-CM

## 2022-11-24 DIAGNOSIS — Z79899 Other long term (current) drug therapy: Secondary | ICD-10-CM

## 2022-11-24 DIAGNOSIS — F411 Generalized anxiety disorder: Secondary | ICD-10-CM

## 2022-11-24 MED ORDER — SERTRALINE HCL 100 MG PO TABS
100.0000 mg | ORAL_TABLET | Freq: Every day | ORAL | 1 refills | Status: DC
  Filled 2022-11-24: qty 30, 30d supply, fill #0
  Filled 2022-12-27: qty 30, 30d supply, fill #1

## 2022-11-24 MED ORDER — GABAPENTIN 400 MG PO CAPS
400.0000 mg | ORAL_CAPSULE | Freq: Two times a day (BID) | ORAL | 1 refills | Status: DC
Start: 2022-11-24 — End: 2023-01-05
  Filled 2022-11-24: qty 60, 30d supply, fill #0
  Filled 2022-12-27: qty 60, 30d supply, fill #1

## 2022-11-24 MED ORDER — PRAZOSIN HCL 1 MG PO CAPS
3.0000 mg | ORAL_CAPSULE | Freq: Every day | ORAL | 0 refills | Status: DC
  Filled 2022-11-24: qty 90, 30d supply, fill #0

## 2022-11-24 MED ORDER — PRAZOSIN HCL 1 MG PO CAPS
3.0000 mg | ORAL_CAPSULE | Freq: Every day | ORAL | 1 refills | Status: DC
  Filled 2022-11-24: qty 90, 30d supply, fill #0
  Filled 2022-12-27: qty 90, 30d supply, fill #1

## 2022-11-24 MED ORDER — VALBENAZINE TOSYLATE 80 MG PO CAPS
80.0000 mg | ORAL_CAPSULE | Freq: Every day | ORAL | 1 refills | Status: DC
Start: 2022-11-24 — End: 2023-01-05

## 2022-11-24 MED ORDER — QUETIAPINE FUMARATE 50 MG PO TABS
50.0000 mg | ORAL_TABLET | Freq: Every day | ORAL | 0 refills | Status: DC
Start: 2022-11-24 — End: 2023-01-05
  Filled 2022-11-24: qty 30, 30d supply, fill #0

## 2022-11-24 MED ORDER — BUSPIRONE HCL 10 MG PO TABS
10.0000 mg | ORAL_TABLET | Freq: Two times a day (BID) | ORAL | 1 refills | Status: DC
Start: 2022-11-24 — End: 2023-01-05
  Filled 2022-11-24: qty 60, 30d supply, fill #0
  Filled 2022-12-27: qty 60, 30d supply, fill #1
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
## 2022-11-29 ENCOUNTER — Encounter: Payer: Self-pay | Admitting: Nurse Practitioner

## 2022-11-29 ENCOUNTER — Other Ambulatory Visit (HOSPITAL_COMMUNITY): Payer: Self-pay

## 2022-11-29 ENCOUNTER — Ambulatory Visit (INDEPENDENT_AMBULATORY_CARE_PROVIDER_SITE_OTHER): Payer: MEDICAID | Admitting: Nurse Practitioner

## 2022-11-29 VITALS — BP 109/69 | HR 60 | Temp 97.2°F | Wt 218.4 lb

## 2022-11-29 DIAGNOSIS — F17200 Nicotine dependence, unspecified, uncomplicated: Secondary | ICD-10-CM

## 2022-11-29 DIAGNOSIS — R6884 Jaw pain: Secondary | ICD-10-CM

## 2022-11-29 DIAGNOSIS — F1910 Other psychoactive substance abuse, uncomplicated: Secondary | ICD-10-CM

## 2022-11-29 DIAGNOSIS — N529 Male erectile dysfunction, unspecified: Secondary | ICD-10-CM | POA: Insufficient documentation

## 2022-11-29 DIAGNOSIS — Z8619 Personal history of other infectious and parasitic diseases: Secondary | ICD-10-CM | POA: Diagnosis not present

## 2022-11-29 DIAGNOSIS — Z23 Encounter for immunization: Secondary | ICD-10-CM

## 2022-11-29 DIAGNOSIS — Z1211 Encounter for screening for malignant neoplasm of colon: Secondary | ICD-10-CM | POA: Diagnosis not present

## 2022-11-29 DIAGNOSIS — F411 Generalized anxiety disorder: Secondary | ICD-10-CM

## 2022-11-29 DIAGNOSIS — F4310 Post-traumatic stress disorder, unspecified: Secondary | ICD-10-CM

## 2022-11-29 DIAGNOSIS — E871 Hypo-osmolality and hyponatremia: Secondary | ICD-10-CM

## 2022-11-29 DIAGNOSIS — F1010 Alcohol abuse, uncomplicated: Secondary | ICD-10-CM

## 2022-11-29 DIAGNOSIS — G2401 Drug induced subacute dyskinesia: Secondary | ICD-10-CM

## 2022-11-29 MED ORDER — SILDENAFIL CITRATE 25 MG PO TABS
25.0000 mg | ORAL_TABLET | Freq: Every day | ORAL | 0 refills | Status: DC | PRN
Start: 2022-11-29 — End: 2023-07-01
  Filled 2022-11-29: qty 10, 10d supply, fill #0

## 2022-11-29 NOTE — Assessment & Plan Note (Signed)
Patient educated on CDC recommendation for the vaccine. Verbal consent was obtained from the patient, vaccine administered by nurse, no sign of adverse reactions noted at this time. Patient education on arm soreness and use of tylenol  for this patient  was discussed. Patient educated on the signs and symptoms of adverse effect and advise to contact the office if they occur.  ?

## 2022-11-29 NOTE — Assessment & Plan Note (Signed)
Chronic condition Stated that he has not had a drink of alcohol since May 2024 Patient encouraged to continue to abstain from drinking alcohol

## 2022-11-29 NOTE — Assessment & Plan Note (Signed)
Denied use of illicit drugs since his last hospitalization Need to continue to abstain from use of illicit drugs discussed

## 2022-11-29 NOTE — Assessment & Plan Note (Signed)
Continue gabapentin 400 mg twice daily, Minipress 3 mg at bedtime, Seroquel 50 mg daily at bedtime, Zoloft 100 mg daily, buspirone 10 mg twice daily Patient currently denies SI, HI Encouraged to obtain close follow-up with psychiatrist

## 2022-11-29 NOTE — Assessment & Plan Note (Signed)
Patient referred to ENT Takes Tylenol as needed for history of pain He Is considering surgical repair of his jaw so he can be able to wear dentures

## 2022-11-29 NOTE — Assessment & Plan Note (Signed)
Start sildenafil 25 mg daily as needed Checking testosterone levels

## 2022-11-29 NOTE — Patient Instructions (Addendum)
  Viagra . Take 25 mg once daily as needed 1 hour before sexual activity; may be taken up to 4 hours before sexual activity. May increase to  50mg  if needed and you do not experience any side effect from the medication .   1. Jaw pain  - Ambulatory referral to ENT  2. Tobacco use disorder   3. Screening for colon cancer  - Ambulatory referral to Gastroenterology  4. Need for influenza vaccination  - Flu vaccine trivalent PF, 6mos and older(Flulaval,Afluria,Fluarix,Fluzone)  5. Need for hepatitis C screening test  - Hepatitis C antibody; Future  6. Tardive dyskinesia   7. Alcohol abuse   8. Erectile dysfunction, unspecified erectile dysfunction type  - sildenafil (VIAGRA) 25 MG tablet; Take 1 tablet (25 mg total) by mouth daily as needed for erectile dysfunction.  Dispense: 10 tablet; Refill: 0    It is important that you exercise regularly at least 30 minutes 5 times a week as tolerated  Think about what you will eat, plan ahead. Choose " clean, green, fresh or frozen" over canned, processed or packaged foods which are more sugary, salty and fatty. 70 to 75% of food eaten should be vegetables and fruit. Three meals at set times with snacks allowed between meals, but they must be fruit or vegetables. Aim to eat over a 12 hour period , example 7 am to 7 pm, and STOP after  your last meal of the day. Drink water,generally about 64 ounces per day, no other drink is as healthy. Fruit juice is best enjoyed in a healthy way, by EATING the fruit.  Thanks for choosing [facility] we consider it a privelige to serve you.
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
## 2022-11-29 NOTE — Assessment & Plan Note (Signed)
Stated that he was treated in the past Would like labs to be done to ensure he does not have a current Hep c Infection  Labs ordered.

## 2022-11-29 NOTE — Assessment & Plan Note (Signed)
Continue Zoloft 100 mg daily, gabapentin 400 mg twice daily, buspirone 10 mg 2 times daily. Encouraged to maintain close follow-up with psychiatrist He denies SI, HI

## 2022-11-29 NOTE — Assessment & Plan Note (Signed)
Smokes about 1 pack/day  Asked about quitting: confirms that he/she currently smokes cigarettes Advise to quit smoking: Educated about QUITTING to reduce the risk of cancer, cardio and cerebrovascular disease. Assess willingness: Unwilling to quit at this time, but is working on cutting back. Assist with counseling and pharmacotherapy: Counseled for 5 minutes and literature provided. Arrange for follow up: follow up in 8 months and continue to offer help.

## 2022-11-29 NOTE — Assessment & Plan Note (Signed)
On Ingrezza 80 mg daily Continue current medication Encouraged to maintain close follow-up with psychiatrist

## 2022-11-29 NOTE — Assessment & Plan Note (Signed)
Lab Results  Component Value Date   NA 133 (L) 08/10/2022   K 3.5 08/10/2022   CO2 26 08/10/2022   GLUCOSE 97 08/10/2022   BUN <5 (L) 08/10/2022   CREATININE 0.88 08/10/2022   CALCIUM 8.2 (L) 08/10/2022   GFRNONAA >60 08/10/2022  Denies drinking alcohol recently Will check BMP

## 2022-11-30 ENCOUNTER — Other Ambulatory Visit: Payer: MEDICAID

## 2022-11-30 DIAGNOSIS — N529 Male erectile dysfunction, unspecified: Secondary | ICD-10-CM

## 2022-11-30 DIAGNOSIS — E871 Hypo-osmolality and hyponatremia: Secondary | ICD-10-CM

## 2022-11-30 DIAGNOSIS — Z8619 Personal history of other infectious and parasitic diseases: Secondary | ICD-10-CM

## 2022-12-01 ENCOUNTER — Other Ambulatory Visit: Payer: Self-pay | Admitting: Nurse Practitioner

## 2022-12-01 DIAGNOSIS — Z114 Encounter for screening for human immunodeficiency virus [HIV]: Secondary | ICD-10-CM

## 2022-12-02 LAB — HIV ANTIBODY (ROUTINE TESTING W REFLEX): HIV Screen 4th Generation wRfx: NONREACTIVE

## 2022-12-04 LAB — HCV RNA QUANT RFLX ULTRA OR GENOTYP
HCV Quant Baseline: 90 [IU]/mL
HCV log10: 1.954 {Log_IU}/mL

## 2022-12-04 LAB — BASIC METABOLIC PANEL
BUN/Creatinine Ratio: 7 — ABNORMAL LOW (ref 9–20)
BUN: 7 mg/dL (ref 6–24)
CO2: 20 mmol/L (ref 20–29)
Calcium: 9.8 mg/dL (ref 8.7–10.2)
Chloride: 102 mmol/L (ref 96–106)
Creatinine, Ser: 0.99 mg/dL (ref 0.76–1.27)
Glucose: 92 mg/dL (ref 70–99)
Potassium: 4 mmol/L (ref 3.5–5.2)
Sodium: 139 mmol/L (ref 134–144)
eGFR: 95 mL/min/{1.73_m2} (ref >59)

## 2022-12-04 LAB — TESTOSTERONE: Testosterone: 426 ng/dL (ref 264–916)

## 2022-12-04 LAB — HEPATITIS C ANTIBODY: Hep C Virus Ab: REACTIVE — AB

## 2022-12-05 ENCOUNTER — Other Ambulatory Visit: Payer: Self-pay | Admitting: Nurse Practitioner

## 2022-12-05 ENCOUNTER — Telehealth: Payer: Self-pay | Admitting: Nurse Practitioner

## 2022-12-05 DIAGNOSIS — Z8619 Personal history of other infectious and parasitic diseases: Secondary | ICD-10-CM

## 2022-12-05 DIAGNOSIS — Z1159 Encounter for screening for other viral diseases: Secondary | ICD-10-CM

## 2022-12-05 NOTE — Telephone Encounter (Signed)
Pt requesting call back regarding lab results  Please call at [phone]

## 2022-12-06 ENCOUNTER — Telehealth (INDEPENDENT_AMBULATORY_CARE_PROVIDER_SITE_OTHER): Payer: Self-pay | Admitting: Otolaryngology

## 2022-12-06 NOTE — Telephone Encounter (Signed)
This patient was referred to [place] for complaints of chronic right-sided jaw pain. Is this something we see?

## 2022-12-08 ENCOUNTER — Other Ambulatory Visit: Payer: Self-pay | Admitting: Nurse Practitioner

## 2022-12-08 DIAGNOSIS — R6884 Jaw pain: Secondary | ICD-10-CM

## 2022-12-11 ENCOUNTER — Other Ambulatory Visit (HOSPITAL_COMMUNITY): Payer: Self-pay

## 2022-12-14 ENCOUNTER — Telehealth (HOSPITAL_COMMUNITY): Payer: Self-pay

## 2022-12-14 NOTE — Telephone Encounter (Signed)
Amado Coe from [facility] emailed therapist for update on this patient.  Sent the following email as a response: "Erasmo Leventhal: Last seen by a psychiatrist on 11-24-22. No future appointments."  Remigio Eisenmenger, MS., LMFT, LCAS 12-14-22

## 2022-12-16 NOTE — Telephone Encounter (Signed)
Done and appt made.KH

## 2022-12-20 ENCOUNTER — Other Ambulatory Visit: Payer: Self-pay

## 2022-12-27 ENCOUNTER — Other Ambulatory Visit (HOSPITAL_COMMUNITY): Payer: Self-pay
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
## 2023-01-05 ENCOUNTER — Other Ambulatory Visit (HOSPITAL_COMMUNITY): Payer: Self-pay

## 2023-01-05 ENCOUNTER — Other Ambulatory Visit: Payer: Self-pay

## 2023-01-05 ENCOUNTER — Ambulatory Visit (INDEPENDENT_AMBULATORY_CARE_PROVIDER_SITE_OTHER): Payer: MEDICAID | Admitting: Student

## 2023-01-05 DIAGNOSIS — F411 Generalized anxiety disorder: Secondary | ICD-10-CM

## 2023-01-05 DIAGNOSIS — F4310 Post-traumatic stress disorder, unspecified: Secondary | ICD-10-CM | POA: Diagnosis not present

## 2023-01-05 DIAGNOSIS — G2401 Drug induced subacute dyskinesia: Secondary | ICD-10-CM | POA: Diagnosis not present

## 2023-01-05 DIAGNOSIS — F19959 Other psychoactive substance use, unspecified with psychoactive substance-induced psychotic disorder, unspecified: Secondary | ICD-10-CM | POA: Diagnosis not present

## 2023-01-05 MED ORDER — VALBENAZINE TOSYLATE 80 MG PO CAPS
80.0000 mg | ORAL_CAPSULE | Freq: Every day | ORAL | 2 refills | Status: AC
  Filled 2023-01-05 (×2): qty 30, 30d supply, fill #0

## 2023-01-05 MED ORDER — BENZTROPINE MESYLATE 1 MG PO TABS
1.0000 mg | ORAL_TABLET | Freq: Two times a day (BID) | ORAL | 2 refills | Status: DC | PRN
Start: 2023-01-05 — End: 2023-07-01
  Filled 2023-01-05: qty 60, 30d supply, fill #0

## 2023-01-05 MED ORDER — GABAPENTIN 400 MG PO CAPS
400.0000 mg | ORAL_CAPSULE | Freq: Two times a day (BID) | ORAL | 2 refills | Status: DC
  Filled 2023-01-05: qty 60, 30d supply, fill #0

## 2023-01-05 MED ORDER — QUETIAPINE FUMARATE 50 MG PO TABS
50.0000 mg | ORAL_TABLET | Freq: Every day | ORAL | 2 refills | Status: DC
  Filled 2023-01-05: qty 30, 30d supply, fill #0

## 2023-01-05 MED ORDER — PRAZOSIN HCL 1 MG PO CAPS
3.0000 mg | ORAL_CAPSULE | Freq: Every day | ORAL | 2 refills | Status: DC
  Filled 2023-01-05: qty 90, 30d supply, fill #0

## 2023-01-05 MED ORDER — SERTRALINE HCL 50 MG PO TABS
150.0000 mg | ORAL_TABLET | Freq: Every day | ORAL | 2 refills | Status: DC
  Filled 2023-01-05: qty 90, 30d supply, fill #0

## 2023-01-06 ENCOUNTER — Other Ambulatory Visit (HOSPITAL_COMMUNITY): Payer: Self-pay

## 2023-01-08 ENCOUNTER — Other Ambulatory Visit (HOSPITAL_COMMUNITY): Payer: Self-pay

## 2023-01-17 ENCOUNTER — Other Ambulatory Visit (HOSPITAL_COMMUNITY): Payer: Self-pay

## 2023-01-25 ENCOUNTER — Other Ambulatory Visit (HOSPITAL_COMMUNITY): Payer: Self-pay

## 2023-02-27 ENCOUNTER — Other Ambulatory Visit: Payer: Self-pay

## 2023-03-01 ENCOUNTER — Telehealth (HOSPITAL_COMMUNITY): Payer: Self-pay | Admitting: Student

## 2023-03-08 ENCOUNTER — Encounter (HOSPITAL_COMMUNITY): Payer: No Payment, Other | Admitting: Student

## 2023-06-30 ENCOUNTER — Encounter (HOSPITAL_COMMUNITY): Payer: Self-pay | Admitting: Emergency Medicine

## 2023-06-30 ENCOUNTER — Other Ambulatory Visit: Payer: Self-pay

## 2023-06-30 ENCOUNTER — Inpatient Hospital Stay (HOSPITAL_COMMUNITY): Payer: MEDICAID

## 2023-06-30 ENCOUNTER — Inpatient Hospital Stay (HOSPITAL_COMMUNITY)
Admission: EM | Admit: 2023-06-30 | Discharge: 2023-07-06 | DRG: 894 | Discharge status: Against Medical Advice | Payer: MEDICAID | Attending: Internal Medicine | Admitting: Internal Medicine

## 2023-06-30 DIAGNOSIS — Z653 Problems related to other legal circumstances: Secondary | ICD-10-CM

## 2023-06-30 DIAGNOSIS — L03114 Cellulitis of left upper limb: Secondary | ICD-10-CM | POA: Diagnosis present

## 2023-06-30 DIAGNOSIS — F15959 Other stimulant use, unspecified with stimulant-induced psychotic disorder, unspecified: Secondary | ICD-10-CM | POA: Diagnosis present

## 2023-06-30 DIAGNOSIS — F1010 Alcohol abuse, uncomplicated: Secondary | ICD-10-CM | POA: Diagnosis not present

## 2023-06-30 DIAGNOSIS — L03113 Cellulitis of right upper limb: Secondary | ICD-10-CM | POA: Diagnosis not present

## 2023-06-30 DIAGNOSIS — F200 Paranoid schizophrenia: Secondary | ICD-10-CM | POA: Diagnosis present

## 2023-06-30 DIAGNOSIS — R441 Visual hallucinations: Secondary | ICD-10-CM | POA: Diagnosis present

## 2023-06-30 DIAGNOSIS — F319 Bipolar disorder, unspecified: Secondary | ICD-10-CM | POA: Diagnosis present

## 2023-06-30 DIAGNOSIS — F10231 Alcohol dependence with withdrawal delirium: Secondary | ICD-10-CM | POA: Diagnosis present

## 2023-06-30 DIAGNOSIS — Z801 Family history of malignant neoplasm of trachea, bronchus and lung: Secondary | ICD-10-CM

## 2023-06-30 DIAGNOSIS — F4310 Post-traumatic stress disorder, unspecified: Secondary | ICD-10-CM | POA: Diagnosis present

## 2023-06-30 DIAGNOSIS — E46 Unspecified protein-calorie malnutrition: Secondary | ICD-10-CM | POA: Diagnosis not present

## 2023-06-30 DIAGNOSIS — F10931 Alcohol use, unspecified with withdrawal delirium: Secondary | ICD-10-CM | POA: Diagnosis not present

## 2023-06-30 DIAGNOSIS — F17200 Nicotine dependence, unspecified, uncomplicated: Secondary | ICD-10-CM | POA: Diagnosis present

## 2023-06-30 DIAGNOSIS — R9431 Abnormal electrocardiogram [ECG] [EKG]: Secondary | ICD-10-CM | POA: Diagnosis not present

## 2023-06-30 DIAGNOSIS — Z8249 Family history of ischemic heart disease and other diseases of the circulatory system: Secondary | ICD-10-CM

## 2023-06-30 DIAGNOSIS — Z880 Allergy status to penicillin: Secondary | ICD-10-CM

## 2023-06-30 DIAGNOSIS — Z823 Family history of stroke: Secondary | ICD-10-CM

## 2023-06-30 DIAGNOSIS — T50916A Underdosing of multiple unspecified drugs, medicaments and biological substances, initial encounter: Secondary | ICD-10-CM | POA: Diagnosis present

## 2023-06-30 DIAGNOSIS — G2401 Drug induced subacute dyskinesia: Secondary | ICD-10-CM | POA: Diagnosis present

## 2023-06-30 DIAGNOSIS — F17210 Nicotine dependence, cigarettes, uncomplicated: Secondary | ICD-10-CM | POA: Diagnosis present

## 2023-06-30 DIAGNOSIS — Z22322 Carrier or suspected carrier of Methicillin resistant Staphylococcus aureus: Secondary | ICD-10-CM

## 2023-06-30 DIAGNOSIS — E871 Hypo-osmolality and hyponatremia: Secondary | ICD-10-CM | POA: Diagnosis present

## 2023-06-30 DIAGNOSIS — F411 Generalized anxiety disorder: Secondary | ICD-10-CM | POA: Diagnosis present

## 2023-06-30 DIAGNOSIS — F209 Schizophrenia, unspecified: Secondary | ICD-10-CM | POA: Diagnosis not present

## 2023-06-30 DIAGNOSIS — R64 Cachexia: Secondary | ICD-10-CM | POA: Diagnosis present

## 2023-06-30 DIAGNOSIS — F32A Depression, unspecified: Secondary | ICD-10-CM | POA: Diagnosis present

## 2023-06-30 DIAGNOSIS — R45851 Suicidal ideations: Secondary | ICD-10-CM | POA: Diagnosis present

## 2023-06-30 DIAGNOSIS — Z5329 Procedure and treatment not carried out because of patient's decision for other reasons: Secondary | ICD-10-CM | POA: Diagnosis not present

## 2023-06-30 DIAGNOSIS — Z9151 Personal history of suicidal behavior: Secondary | ICD-10-CM

## 2023-06-30 DIAGNOSIS — Z885 Allergy status to narcotic agent status: Secondary | ICD-10-CM

## 2023-06-30 DIAGNOSIS — F10932 Alcohol use, unspecified with withdrawal with perceptual disturbance: Secondary | ICD-10-CM | POA: Diagnosis not present

## 2023-06-30 DIAGNOSIS — Z8619 Personal history of other infectious and parasitic diseases: Secondary | ICD-10-CM | POA: Diagnosis not present

## 2023-06-30 DIAGNOSIS — E43 Unspecified severe protein-calorie malnutrition: Secondary | ICD-10-CM | POA: Insufficient documentation

## 2023-06-30 DIAGNOSIS — Z79899 Other long term (current) drug therapy: Secondary | ICD-10-CM | POA: Diagnosis not present

## 2023-06-30 DIAGNOSIS — F1910 Other psychoactive substance abuse, uncomplicated: Secondary | ICD-10-CM | POA: Diagnosis not present

## 2023-06-30 DIAGNOSIS — Z720 Tobacco use: Secondary | ICD-10-CM | POA: Diagnosis not present

## 2023-06-30 DIAGNOSIS — Z6823 Body mass index (BMI) 23.0-23.9, adult: Secondary | ICD-10-CM

## 2023-06-30 DIAGNOSIS — R443 Hallucinations, unspecified: Principal | ICD-10-CM

## 2023-06-30 DIAGNOSIS — Z5900 Homelessness unspecified: Secondary | ICD-10-CM

## 2023-06-30 DIAGNOSIS — Z833 Family history of diabetes mellitus: Secondary | ICD-10-CM

## 2023-06-30 DIAGNOSIS — F29 Unspecified psychosis not due to a substance or known physiological condition: Secondary | ICD-10-CM | POA: Diagnosis not present

## 2023-06-30 DIAGNOSIS — Z781 Physical restraint status: Secondary | ICD-10-CM | POA: Diagnosis not present

## 2023-06-30 DIAGNOSIS — I959 Hypotension, unspecified: Secondary | ICD-10-CM | POA: Diagnosis present

## 2023-06-30 DIAGNOSIS — F10939 Alcohol use, unspecified with withdrawal, unspecified: Secondary | ICD-10-CM | POA: Diagnosis present

## 2023-06-30 DIAGNOSIS — Z91120 Patient's intentional underdosing of medication regimen due to financial hardship: Secondary | ICD-10-CM

## 2023-06-30 LAB — COMPREHENSIVE METABOLIC PANEL WITH GFR
ALT: 87 U/L — ABNORMAL HIGH (ref 0–44)
AST: 73 U/L — ABNORMAL HIGH (ref 15–41)
Albumin: 3.5 g/dL (ref 3.5–5.0)
Alkaline Phosphatase: 66 U/L (ref 38–126)
Anion gap: 10 (ref 5–15)
BUN: 18 mg/dL (ref 6–20)
CO2: 23 mmol/L (ref 22–32)
Calcium: 8.9 mg/dL (ref 8.9–10.3)
Chloride: 91 mmol/L — ABNORMAL LOW (ref 98–111)
Creatinine, Ser: 0.93 mg/dL (ref 0.61–1.24)
GFR, Estimated: >60 mL/min (ref >60)
Glucose, Bld: 127 mg/dL — ABNORMAL HIGH (ref 70–99)
Potassium: 3.7 mmol/L (ref 3.5–5.1)
Sodium: 124 mmol/L — ABNORMAL LOW (ref 135–145)
Total Bilirubin: 1.3 mg/dL — ABNORMAL HIGH (ref 0.0–1.2)
Total Protein: 9 g/dL — ABNORMAL HIGH (ref 6.5–8.1)

## 2023-06-30 LAB — SALICYLATE LEVEL: Salicylate Lvl: <7 mg/dL — ABNORMAL LOW (ref 7.0–30.0)

## 2023-06-30 LAB — CBC
HCT: 39.8 % (ref 39.0–52.0)
Hemoglobin: 13.8 g/dL (ref 13.0–17.0)
MCH: 33.8 pg (ref 26.0–34.0)
MCHC: 34.7 g/dL (ref 30.0–36.0)
MCV: 97.5 fL (ref 80.0–100.0)
Platelets: 397 10*3/uL (ref 150–400)
RBC: 4.08 MIL/uL — ABNORMAL LOW (ref 4.22–5.81)
RDW: 13.1 % (ref 11.5–15.5)
WBC: 11.7 10*3/uL — ABNORMAL HIGH (ref 4.0–10.5)
nRBC: 0 % (ref 0.0–0.2)

## 2023-06-30 LAB — ETHANOL: Alcohol, Ethyl (B): <10 mg/dL (ref <10)

## 2023-06-30 LAB — ACETAMINOPHEN LEVEL: Acetaminophen (Tylenol), Serum: <10 ug/mL — ABNORMAL LOW (ref 10–30)

## 2023-06-30 MED ORDER — THIAMINE HCL 100 MG/ML IJ SOLN
100.0000 mg | Freq: Every day | INTRAMUSCULAR | Status: DC
Start: 2023-07-01 — End: 2023-07-06
  Administered 2023-07-01: 100 mg via INTRAVENOUS
  Filled 2023-06-30: qty 2

## 2023-06-30 MED ORDER — THIAMINE MONONITRATE 100 MG PO TABS
100.0000 mg | ORAL_TABLET | Freq: Every day | ORAL | Status: DC
  Administered 2023-07-02 – 2023-07-05 (×4): 100 mg via ORAL
  Filled 2023-06-30 (×5): qty 1

## 2023-06-30 MED ORDER — LORAZEPAM 1 MG PO TABS: 0.0000 mg | ORAL_TABLET | Freq: Four times a day (QID) | ORAL | Status: DC

## 2023-06-30 MED ORDER — LORAZEPAM 1 MG PO TABS
0.0000 mg | ORAL_TABLET | Freq: Two times a day (BID) | ORAL | Status: AC
  Administered 2023-07-03: 2 mg via ORAL
  Administered 2023-07-03: 1 mg via ORAL
  Administered 2023-07-04 (×2): 2 mg via ORAL
  Filled 2023-06-30: qty 2
  Filled 2023-06-30: qty 1
  Filled 2023-06-30 (×2): qty 2

## 2023-06-30 MED ORDER — LORAZEPAM 1 MG PO TABS
2.0000 mg | ORAL_TABLET | Freq: Once | ORAL | Status: AC
  Administered 2023-06-30: 2 mg via ORAL
  Filled 2023-06-30: qty 2

## 2023-06-30 MED ORDER — LORAZEPAM 2 MG/ML IJ SOLN: 0.0000 mg | Freq: Two times a day (BID) | INTRAMUSCULAR | Status: AC

## 2023-06-30 MED ORDER — LORAZEPAM 2 MG/ML IJ SOLN
0.0000 mg | Freq: Four times a day (QID) | INTRAMUSCULAR | Status: DC
  Administered 2023-06-30: 2 mg via INTRAVENOUS
  Filled 2023-06-30 (×2): qty 2

## 2023-06-30 NOTE — ED Notes (Addendum)
 Patient approached the nurses station and tried to speak with the RN in hushed tones that he knows someone is trying to kill him here tonight. He is pacing the hallway, anxiety is seems to be increasing.

## 2023-06-30 NOTE — ED Notes (Signed)
 RN attempted to obtain information for triage. The patient shushed the RN and put one finger up. It appeared he was trying to hear another voice only audible to himself.

## 2023-06-30 NOTE — H&P (Signed)
 FLYNN GWYN ZOX:[id number] DOB: [birth year] DOA: 06/30/2023     PCP: Paseda, Folashade R, FNP   Outpatient Specialists: * NONE CARDS: * Dr. None  NEphrology: *  Dr. No care team member to display  NEurology *   Dr. Pulmonary *  Dr.  Oncology * Dr.No care team member to display  GI* Dr.  Cherene Core, LB) No care team member to display Urology Dr. *  Patient arrived to ER on 06/30/23 at 2036 Referred by Attending Long, Shereen Dike, MD   Patient coming from:   homeless    Chief Complaint:   Chief Complaint  Patient presents with   Hallucinations    HPI: RAND ETCHISON is a 48 y.o. male with medical history significant of alcohol abuse and schizophrenia history of DTs history of polysubstance abuse, GAD tobacco abuse, malingering, PTSD, tardive dyskinesia    Presented with hallucinations Patient was found at the gas hallucinating Voices are telling him to hurt himself Hx of EtOH abuse and schizophrenia  Last EtOH was 2 days ago  Was noted to be tremulous Improved with Ativan  Hx of DT's in the past  He is IVC'd at this time     Denies significant ETOH intake *** Does not smoke*** but interested in quitting***  Lab Results  Component Value Date   SARSCOV2NAA NEGATIVE 08/11/2022   SARSCOV2NAA NEGATIVE 07/09/2022   SARSCOV2NAA NEGATIVE 07/07/2022   SARSCOV2NAA NEGATIVE 07/16/2021      While in ER:     Found to be actively hallucinating was IVC need labs significant for alcohol undetectable sodium down to 124 LFTs elevated total protein up to 9.0    Lab Orders         Comprehensive metabolic panel         Ethanol         Salicylate level         Acetaminophen level         cbc         Rapid urine drug screen (hospital performed)      CT HEAD *** NON acute   MRI brain  ***no acute CVA  CXR - ***NON acute  CTabd/pelvis - ***nonacute  CTA chest - ***nonacute, no PE, * no evidence of infiltrate  Following Medications were ordered in ER: Medications   LORazepam (ATIVAN) injection 0-4 mg (has no administration in time range)    Or  LORazepam (ATIVAN) tablet 0-4 mg (has no administration in time range)  LORazepam (ATIVAN) injection 0-4 mg (has no administration in time range)    Or  LORazepam (ATIVAN) tablet 0-4 mg (has no administration in time range)  thiamine (VITAMIN B1) tablet 100 mg (has no administration in time range)    Or  thiamine (VITAMIN B1) injection 100 mg (has no administration in time range)  LORazepam (ATIVAN) tablet 2 mg (2 mg Oral Given 06/30/23 2227)        ED Triage Vitals  Encounter Vitals Group     BP 06/30/23 2057 133/89     Systolic BP Percentile --      Diastolic BP Percentile --      Pulse Rate 06/30/23 2057 (!) 102     Resp 06/30/23 2057 20     Temp 06/30/23 2310 98.5 F (36.9 C)     Temp Source 06/30/23 2310 Oral     SpO2 06/30/23 2057 98 %     Weight --      Height --  Head Circumference --      Peak Flow --      Pain Score --      Pain Loc --      Pain Education --      Exclude from Growth Chart --   ZOXW(96)@     _________________________________________ Significant initial  Findings: Abnormal Labs Reviewed  COMPREHENSIVE METABOLIC PANEL WITH GFR - Abnormal; Notable for the following components:      Result Value   Sodium 124 (*)    Chloride 91 (*)    Glucose, Bld 127 (*)    Total Protein 9.0 (*)    AST 73 (*)    ALT 87 (*)    Total Bilirubin 1.3 (*)    All other components within normal limits  SALICYLATE LEVEL - Abnormal; Notable for the following components:   Salicylate Lvl <7.0 (*)    All other components within normal limits  ACETAMINOPHEN LEVEL - Abnormal; Notable for the following components:   Acetaminophen (Tylenol), Serum <10 (*)    All other components within normal limits  CBC - Abnormal; Notable for the following components:   WBC 11.7 (*)    RBC 4.08 (*)    All other components within normal limits      _________________________ Troponin  ***ordered Cardiac Panel (last 3 results) No results for input(s): "CKTOTAL", "CKMB", "TROPONINIHS", "RELINDX" in the last 72 hours.   ECG: Ordered Personally reviewed and interpreted by me showing: HR : 100 Rhythm: Normal sinus rhythm Possible Left atrial enlargement Rightward axis Borderline ECG QTC 446  BNP (last 3 results) No results for input(s): "BNP" in the last 8760 hours.   COVID-19 Labs  No results for input(s): "DDIMER", "FERRITIN", "LDH", "CRP" in the last 72 hours.  Lab Results  Component Value Date   SARSCOV2NAA NEGATIVE 08/11/2022   SARSCOV2NAA NEGATIVE 07/09/2022   SARSCOV2NAA NEGATIVE 07/07/2022   SARSCOV2NAA NEGATIVE 07/16/2021       ____________________ This patient meets SIRS Criteria and may be septic. SIRS = Systemic Inflammatory Response Syndrome  Order a lactic acid level if needed AND/OR Initiate the sepsis protocol with the attached order set OR Click "Treating Associated Infection or Illness" if the patient is being treated for an infection that is a known cause of these abnormalities     The recent clinical data is shown below. Vitals:   06/30/23 2057 06/30/23 2221 06/30/23 2310 06/30/23 2333  BP: 133/89 112/70  134/82  Pulse: (!) 102 83  99  Resp: 20     Temp:   98.5 F (36.9 C)   TempSrc:   Oral   SpO2: 98%           WBC     Component Value Date/Time   WBC 11.7 (H) 06/30/2023 2052   LYMPHSABS 3.5 07/17/2022 1538   MONOABS 0.8 07/17/2022 1538   EOSABS 0.1 07/17/2022 1538   BASOSABS 0.1 07/17/2022 1538        Lactic Acid, Venous No results found for: "LATICACIDVEN"    Lactic Acid, Venous No results found for: "LATICACIDVEN"  Procalcitonin *** Ordered      UA *** no evidence of UTI  ***Pending ***not ordered   Urine analysis:    Component Value Date/Time   COLORURINE COLORLESS (A) 07/16/2021 1253   APPEARANCEUR CLEAR 07/16/2021 1253   LABSPEC 1.006 07/16/2021 1253   PHURINE 6.0 07/16/2021 1253    GLUCOSEU NEGATIVE 07/16/2021 1253   HGBUR SMALL (A) 07/16/2021 1253   BILIRUBINUR NEGATIVE 07/16/2021 1253  KETONESUR NEGATIVE 07/16/2021 1253   PROTEINUR NEGATIVE 07/16/2021 1253   UROBILINOGEN 0.2 05/10/2021 1445   NITRITE NEGATIVE 07/16/2021 1253   LEUKOCYTESUR NEGATIVE 07/16/2021 1253    Results for orders placed or performed during the hospital encounter of 08/10/22  SARS Coronavirus 2 by RT PCR (hospital order, performed in [facility] hospital lab) *cepheid single result test* Anterior Nasal Swab     Status: None   Collection Time: 08/11/22  2:30 PM   Specimen: Anterior Nasal Swab  Result Value Ref Range Status   SARS Coronavirus 2 by RT PCR NEGATIVE NEGATIVE Final    Comment: (NOTE) SARS-CoV-2 target nucleic acids are NOT DETECTED.  The SARS-CoV-2 RNA is generally detectable in upper and lower respiratory specimens during the acute phase of infection. The lowest concentration of SARS-CoV-2 viral copies this assay can detect is 250 copies / mL. A negative result does not preclude SARS-CoV-2 infection and should not be used as the sole basis for treatment or other patient management decisions.  A negative result may occur with improper specimen collection / handling, submission of specimen other than nasopharyngeal swab, presence of viral mutation(s) within the areas targeted by this assay, and inadequate number of viral copies (<250 copies / mL). A negative result must be combined with clinical observations, patient history, and epidemiological information.  Fact Sheet for Patients:   RoadLapTop.co.za  Fact Sheet for Healthcare Providers: http://kim-miller.com/  This test is not yet approved or  cleared by the [place]  FDA and has been authorized for detection and/or diagnosis of SARS-CoV-2 by FDA under an Emergency Use Authorization (EUA).  This EUA will remain in effect (meaning this test can be used) for the duration  of the COVID-19 declaration under Section 564(b)(1) of the Act, 21 U.S.C. section 360bbb-3(b)(1), unless the authorization is terminated or revoked sooner.  Performed at [facility], [address], [place], [place] [address]     ABX started Antibiotics Given (last 72 hours)     None       No results found for the last 90 days.     ________________________________________________________________  Arterial ***Venous  Blood Gas result:  pH *** pCO2 ***; pO2 ***;     %O2 Sat ***.  ABG    Component Value Date/Time   TCO2 25 07/16/2021 1153       __________________________________________________________ Recent Labs  Lab 06/30/23 2052  NA 124*  K 3.7  CO2 23  GLUCOSE 127*  BUN 18  CREATININE 0.93  CALCIUM 8.9    Cr  * stable,  Up from baseline see below Lab Results  Component Value Date   CREATININE 0.93 06/30/2023   CREATININE 0.99 11/30/2022   CREATININE 0.88 08/10/2022    Recent Labs  Lab 06/30/23 2052  AST 73*  ALT 87*  ALKPHOS 66  BILITOT 1.3*  PROT 9.0*  ALBUMIN 3.5   Lab Results  Component Value Date   CALCIUM 8.9 06/30/2023   PHOS 4.3 07/26/2022          Plt: Lab Results  Component Value Date   PLT 397 06/30/2023         Recent Labs  Lab 06/30/23 2052  WBC 11.7*  HGB 13.8  HCT 39.8  MCV 97.5  PLT 397    HG/HCT * stable,  Down *Up from baseline see below    Component Value Date/Time   HGB 13.8 06/30/2023 2052   HCT 39.8 06/30/2023 2052   MCV 97.5 06/30/2023 2052  No results for input(s): "LIPASE", "AMYLASE" in the last 168 hours. No results for input(s): "AMMONIA" in the last 168 hours.    .lab  _______________________________________________ Hospitalist was called for admission for *** Hallucinations ***  Psychosis, unspecified psychosis type (HCC) ***  Alcohol withdrawal syndrome, with delirium (HCC) ***    The following Work up has been ordered so far:  Orders Placed  This Encounter  Procedures   Comprehensive metabolic panel   Ethanol   Salicylate level   Acetaminophen level   cbc   Rapid urine drug screen (hospital performed)   Diet regular Room service appropriate? Yes; Fluid consistency: Thin   Safety Observation   Clinical institute withdrawal assessment   Notify Physician if patient has NO ativan orders and CIWA score >/=5   Notify Physician if patient HAS Ativan orders and CIWA score >15   Vital signs   Notify physician (specify)   Pharmacy Tech to prioritize PTA Med Rec   Initiate Carrier Fluid Protocol   Clinical institute withdrawal assessment every 6 hours X 48 hours, then every 12 hours x 48 hours   Notify MD if CIWA-AR is greater than 10 for 2 consecutive assessments.   Notify MD if CIWA-AR score > 10 point increase (consider critical care consult).   Notify MD if patient has a seizure.   May administer Ativan PO versus IV if patient is tolerating PO intake well   Vital signs every 6 hours X 48 hours, then per unit protocol   Refer to Sidebar Report CIWA protocol   If Ativan given, reassess Clinical Institute Withdrawal Assessment (CIWA) with blood pressure and pulse rate within 1 hour of Ativan administration   Full code   Consult to hospitalist   ED EKG   Involuntary Commitment with 1:1 Continuous Monitoring   Place in psych hold     OTHER Significant initial  Findings:  labs showing:     DM  labs:  HbA1C: Recent Labs    07/09/22 1459  HGBA1C 5.3       CBG (last 3)  No results for input(s): "GLUCAP" in the last 72 hours.        Cultures:    Component Value Date/Time   SDES URINE, CLEAN CATCH 10/26/2020 2330   SPECREQUEST NONE 10/26/2020 2330   CULT (A) 10/26/2020 2330    <10,000 COLONIES/mL INSIGNIFICANT GROWTH Performed at [facility], [address] [place], [place] [address]    REPTSTATUS 10/28/2020 FINAL 10/26/2020 2330     Radiological Exams on Admission: No results  found. _______________________________________________________________________________________________________ Latest  Blood pressure 134/82, pulse 99, temperature 98.5 F (36.9 C), temperature source Oral, resp. rate 20, SpO2 98%.   Vitals  labs and radiology finding personally reviewed  Review of Systems:    Pertinent positives include: ***  Constitutional:  No weight loss, night sweats, Fevers, chills, fatigue, weight loss  HEENT:  No headaches, Difficulty swallowing,Tooth/dental problems,Sore throat,  No sneezing, itching, ear ache, nasal congestion, post nasal drip,  Cardio-vascular:  No chest pain, Orthopnea, PND, anasarca, dizziness, palpitations.no Bilateral lower extremity swelling  GI:  No heartburn, indigestion, abdominal pain, nausea, vomiting, diarrhea, change in bowel habits, loss of appetite, melena, blood in stool, hematemesis Resp:  no shortness of breath at rest. No dyspnea on exertion, No excess mucus, no productive cough, No non-productive cough, No coughing up of blood.No change in color of mucus.No wheezing. Skin:  no rash or lesions. No jaundice GU:  no dysuria, change in color of  urine, no urgency or frequency. No straining to urinate.  No flank pain.  Musculoskeletal:  No joint pain or no joint swelling. No decreased range of motion. No back pain.  Psych:  No change in mood or affect. No depression or anxiety. No memory loss.  Neuro: no localizing neurological complaints, no tingling, no weakness, no double vision, no gait abnormality, no slurred speech, no confusion  All systems reviewed and apart from HOPI all are negative _______________________________________________________________________________________________ Past Medical History:   Past Medical History:  Diagnosis Date   Anxiety    Bipolar 1 disorder (HCC)    Depression    Drug-induced seizure (HCC)    EtOH dependence (HCC)    Hallucination    Paranoid schizophrenia (HCC)    since pt's  20's   PTSD (post-traumatic stress disorder)       Past Surgical History:  Procedure Laterality Date   KIDNEY DONATION Right 2003    Social History:  Ambulatory *** independently cane, walker  wheelchair bound, bed bound     reports that he has been smoking cigarettes. He has never used smokeless tobacco. He reports that he does not currently use alcohol. He reports that he does not currently use drugs after having used the following drugs: Cocaine, Heroin, Amphetamines, Methamphetamines, and Marijuana.     Family History: *** Family History  Problem Relation Age of Onset   Heart disease Mother    Cancer Mother    Lung cancer Mother    Heart disease Father    Diabetes Father    Diabetes Maternal Grandmother    Stroke Paternal Grandfather    ______________________________________________________________________________________________ Allergies: Allergies  Allergen Reactions   Penicillins Other (See Comments) and Swelling    Reaction occurred in childhood   Suboxone [Buprenorphine Hcl-Naloxone Hcl] Other (See Comments)    "Made me feel terrible"     Prior to Admission medications   Medication Sig Start Date End Date Taking? Authorizing Provider  benztropine (COGENTIN) 1 MG tablet Take 1 tablet (1 mg total) by mouth 2 (two) times daily as needed for tremors. 01/05/23   Augusta Blizzard, MD  gabapentin (NEURONTIN) 400 MG capsule Take 1 capsule (400 mg) by mouth 2 times daily. 01/05/23   Augusta Blizzard, MD  prazosin (MINIPRESS) 1 MG capsule Take 3 capsules (3 mg total) by mouth at bedtime. 01/05/23 04/05/23  Augusta Blizzard, MD  QUEtiapine (SEROQUEL) 50 MG tablet Take 1 tablet (50 mg total) by mouth at bedtime. 01/05/23 04/05/23  Augusta Blizzard, MD  sertraline (ZOLOFT) 50 MG tablet Take 3 tablets (150 mg total) by mouth daily. 01/05/23 04/05/23  Augusta Blizzard, MD  sildenafil (VIAGRA) 25 MG tablet Take 1 tablet (25 mg total) by mouth daily as needed for erectile dysfunction. 11/29/22   Paseda,  Folashade R, FNP    ___________________________________________________________________________________________________ Physical Exam:    06/30/2023   11:33 PM 06/30/2023   10:21 PM 06/30/2023    8:57 PM  Vitals with BMI  Systolic 134 112 829  Diastolic 82 70 89  Pulse 99 83 102     1. General:  in No ***Acute distress***increased work of breathing ***complaining of severe pain****agitated * Chronically ill *well *cachectic *toxic acutely ill -appearing 2. Psychological: Alert and *** Oriented 3. Head/ENT:   Moist *** Dry Mucous Membranes                          Head Non traumatic, neck supple  Normal *** Poor Dentition 4. SKIN: normal *** decreased Skin turgor,  Skin clean Dry and intact no rash    5. Heart: Regular rate and rhythm no*** Murmur, no Rub or gallop 6. Lungs: ***Clear to auscultation bilaterally, no wheezes or crackles   7. Abdomen: Soft, ***non-tender, Non distended *** obese ***bowel sounds present 8. Lower extremities: no clubbing, cyanosis, no ***edema 9. Neurologically Grossly intact, moving all 4 extremities equally *** strength 5 out of 5 in all 4 extremities cranial nerves II through XII intact 10. MSK: Normal range of motion    Chart has been reviewed  ______________________________________________________________________________________________  Assessment/Plan  ***  Admitted for *** Hallucinations ***  Psychosis, unspecified psychosis type (HCC) ***  Alcohol withdrawal syndrome, with delirium (HCC) ***    Present on Admission: **None**     No problem-specific Assessment & Plan notes found for this encounter.    Other plan as per orders.  DVT prophylaxis:  SCD *** Lovenox       Code Status:    Code Status: Full Code FULL CODE *** DNR/DNI ***comfort care as per patient ***family  I had personally discussed CODE STATUS with patient and family*  ACP *** none has been reviewed ***   Family Communication:    Family not at  Bedside  plan of care was discussed on the phone with *** Son, Daughter, Wife, Husband, Sister, Brother , father, mother  Diet  Diet Orders (From admission, onward)     Start     Ordered   06/30/23 2309  Diet regular Room service appropriate? Yes; Fluid consistency: Thin  Diet effective now       Question Answer Comment  Room service appropriate? Yes   Fluid consistency: Thin      06/30/23 2309            Disposition Plan:   *** likely will need placement for rehabilitation                          Back to current facility when stable                            To home once workup is complete and patient is stable  ***Following barriers for discharge:                             Chest pain *** Stroke *** work up is complete                            Electrolytes corrected                               Anemia corrected h/H stable                             Pain controlled with PO medications                               Afebrile, white count improving able to transition to PO antibiotics  Will need to be able to tolerate PO                            Will likely need home health, home O2, set up                           Will need consultants to evaluate patient prior to discharge       Consult Orders  (From admission, onward)           Start     Ordered   06/30/23 2307  Consult to hospitalist  Once       Provider:  (Not yet assigned)  Question Answer Comment  Place call to: [facility]   Reason for Consult Admit      06/30/23 2306                              ***Would benefit from PT/OT eval prior to DC  Ordered                   Swallow eval - SLP ordered                   Diabetes care coordinator                   Transition of care consulted                   Nutrition    consulted                  Wound care  consulted                   Palliative care    consulted                   Behavioral  health  consulted                    Consults called: ***     Admission status:  ED Disposition     ED Disposition  Admit   Condition  --   Comment  The patient appears reasonably stabilized for admission considering the current resources, flow, and capabilities available in the ED at this time, and I doubt any other [facility] requiring further screening and/or treatment in the ED prior to admission is  present.           Obs***  ***  inpatient     I Expect 2 midnight stay secondary to severity of patient's current illness need for inpatient interventions justified by the following: ***hemodynamic instability despite optimal treatment (tachycardia *hypotension * tachypnea *hypoxia, hypercapnia) * Severe lab/radiological/exam abnormalities including:     and extensive comorbidities including: *substance abuse  *Chronic pain *DM2  * CHF * CAD  * COPD/asthma *Morbid Obesity * CKD *dementia *liver disease *history of stroke with residual deficits *  malignancy, * sickle cell disease  History of amputation Chronic anticoagulation  That are currently affecting medical management.   I expect  patient to be hospitalized for 2 midnights requiring inpatient medical care.  Patient is at high risk for adverse outcome (such as loss of life or disability) if not treated.  Indication for inpatient stay as follows:  Severe change from baseline regarding mental status Hemodynamic instability despite maximal  medical therapy,  ongoing suicidal ideations,  severe pain requiring acute inpatient management,  inability to maintain oral hydration   persistent chest pain despite medical management Need for operative/procedural  intervention New or worsening hypoxia   Need for IV antibiotics, IV fluids, IV rate controling medications, IV antihypertensives, IV pain medications, IV anticoagulation, need for biPAP    Level of care   *** tele  For 12H 24H     medical floor        progressive     stepdown   tele indefinitely please discontinue once patient no longer qualifies COVID-19 Labs    Lab Results  Component Value Date   SARSCOV2NAA NEGATIVE 08/11/2022     Precautions: admitted as *** Covid Negative  ***asymptomatic screening protocol****PUI *** covid positive No active isolations ***If Covid PCR is negative  - please DC precautions - would need additional investigation given very high risk for false native test result    Critical***  Patient is critically ill due to  hemodynamic instability * respiratory failure *severe sepsis* ongoing chest pain*  They are at high risk for life/limb threatening clinical deterioration requiring frequent reassessment and modifications of care.  Services provided include examination of the patient, review of relevant ancillary tests, prescription of lifesaving therapies, review of medications and prophylactic therapy.  Total critical care time excluding separately billable procedures: 60*  Minutes.    Cebert Dettmann 06/30/2023, 11:41 PM ***  [facility]     after 2 AM please page floor coverage PA If 7AM-7PM, please contact the day team taking care of the patient using [email or web address]

## 2023-06-30 NOTE — ED Notes (Signed)
 Unable to get patient's temperature at this time. Patient keeps repeating that he wants to go to another hospital at that someone is trying to kill him.

## 2023-06-30 NOTE — ED Triage Notes (Signed)
 [place] due to what appeared to be a psychotic episode. He is having visual and auditory hallucinations. The voices ar telling him that people are trying to hurt him. He endorses ETOH use today, but denies drug use. He has not been compliant with psych medications.    HX schizophrenia  EMS vitals:  130 palpated BP 116 P 95% SPO2 on room air 143 CBG

## 2023-06-30 NOTE — ED Provider Notes (Signed)
 [place] EMERGENCY DEPARTMENT AT [facility] Provider Note   CSN: [id number] Arrival date & time: 06/30/23  2036     History  Chief Complaint  Patient presents with   Hallucinations    Jerry [name] is a 48 y.o. male.  HPI   48 year old male with past medical history of schizophrenia, alcohol abuse with withdrawal/seizures presents to the emergency department with concern for hallucinations.  Patient was found wandering at a gas station with what appeared to be a psychotic episode.  He admits to visual and auditory hallucinations.  He states that these voices are telling him that someone is trying to hurt him and is going to kill him.  He keeps saying this on loop.  He knows his first name but cannot tell me his last name, date of birth or where he is.  He also admits that he stopped drinking cold [place] about 2 days ago.  He is having tremors and feels like he is in delirium tremors.  He has had a seizure from alcohol withdrawal before.  Otherwise he is a difficult historian.  Home Medications Prior to Admission medications   Medication Sig Start Date End Date Taking? Authorizing Provider  benztropine (COGENTIN) 1 MG tablet Take 1 tablet (1 mg total) by mouth 2 (two) times daily as needed for tremors. 01/05/23   Augusta Blizzard, MD  gabapentin (NEURONTIN) 400 MG capsule Take 1 capsule (400 mg) by mouth 2 times daily. 01/05/23   Augusta Blizzard, MD  prazosin (MINIPRESS) 1 MG capsule Take 3 capsules (3 mg total) by mouth at bedtime. 01/05/23 04/05/23  Augusta Blizzard, MD  QUEtiapine (SEROQUEL) 50 MG tablet Take 1 tablet (50 mg total) by mouth at bedtime. 01/05/23 04/05/23  Augusta Blizzard, MD  sertraline (ZOLOFT) 50 MG tablet Take 3 tablets (150 mg total) by mouth daily. 01/05/23 04/05/23  Augusta Blizzard, MD  sildenafil (VIAGRA) 25 MG tablet Take 1 tablet (25 mg total) by mouth daily as needed for erectile dysfunction. 11/29/22   Paseda, Folashade R, FNP      Allergies    Penicillins and Suboxone  [buprenorphine hcl-naloxone hcl]    Review of Systems   Review of Systems  Constitutional:  Negative for fever.  Respiratory:  Negative for shortness of breath.   Cardiovascular:  Negative for chest pain.  Gastrointestinal:  Positive for nausea and vomiting. Negative for abdominal pain and diarrhea.  Skin:  Negative for rash.  Neurological:  Negative for headaches.  Psychiatric/Behavioral:  Positive for agitation, behavioral problems, confusion, decreased concentration, hallucinations and sleep disturbance. Negative for self-injury and suicidal ideas. The patient is nervous/anxious and is hyperactive.     Physical Exam Updated Vital Signs BP 112/70   Pulse 83   Temp 98.5 F (36.9 C) (Oral)   Resp 20   SpO2 98%  Physical Exam Vitals and nursing note reviewed.  Constitutional:      Appearance: Normal appearance.  HENT:     Head: Normocephalic.     Mouth/Throat:     Mouth: Mucous membranes are moist.  Cardiovascular:     Rate and Rhythm: Normal rate.  Pulmonary:     Effort: Pulmonary effort is normal. No respiratory distress.  Abdominal:     Palpations: Abdomen is soft.     Tenderness: There is no abdominal tenderness.  Skin:    General: Skin is warm.  Neurological:     Mental Status: He is alert. He is disoriented.     Comments: Tremors bilaterally  Psychiatric:     Comments: Disorganized thoughts, appears to be responding to internal stimuli, admitting to auditory hallucinations, repeating the same sentences     ED Results / Procedures / Treatments   Labs (all labs ordered are listed, but only abnormal results are displayed) Labs Reviewed  COMPREHENSIVE METABOLIC PANEL WITH GFR - Abnormal; Notable for the following components:      Result Value   Sodium 124 (*)    Chloride 91 (*)    Glucose, Bld 127 (*)    Total Protein 9.0 (*)    AST 73 (*)    ALT 87 (*)    Total Bilirubin 1.3 (*)    All other components within normal limits  SALICYLATE LEVEL - Abnormal;  Notable for the following components:   Salicylate Lvl <7.0 (*)    All other components within normal limits  ACETAMINOPHEN LEVEL - Abnormal; Notable for the following components:   Acetaminophen (Tylenol), Serum <10 (*)    All other components within normal limits  CBC - Abnormal; Notable for the following components:   WBC 11.7 (*)    RBC 4.08 (*)    All other components within normal limits  ETHANOL  RAPID URINE DRUG SCREEN, HOSP PERFORMED    EKG None  Radiology No results found.  Procedures Procedures    Medications Ordered in ED Medications  LORazepam (ATIVAN) injection 0-4 mg (has no administration in time range)    Or  LORazepam (ATIVAN) tablet 0-4 mg (has no administration in time range)  LORazepam (ATIVAN) injection 0-4 mg (has no administration in time range)    Or  LORazepam (ATIVAN) tablet 0-4 mg (has no administration in time range)  thiamine (VITAMIN B1) tablet 100 mg (has no administration in time range)    Or  thiamine (VITAMIN B1) injection 100 mg (has no administration in time range)  LORazepam (ATIVAN) tablet 2 mg (2 mg Oral Given 06/30/23 2227)    ED Course/ Medical Decision Making/ A&P                                 Medical Decision Making Amount and/or Complexity of Data Reviewed Labs: ordered.  Risk OTC drugs. Prescription drug management. Decision regarding hospitalization.   48 year old male presents emergency department after being found at a gas station with what appeared to be a psychotic episode.  Patient admits to having visual and auditory hallucinations, admits to being noncompliant with his schizophrenic medication but also admits to possibly being in alcohol withdrawal.  He has had hallucinations and seizure secondary to alcohol withdrawal before.  On my evaluation patient was tachycardic, tremorous, looks to be in delirium and tremors.  Otherwise there is also what appears to be a large psychiatric component to this.  With 1  dose of oral Ativan heart rate improved and he feels better but is still responding to internal stimuli and displaying findings of hallucinations.  Due to his disorientation and obvious hallucination/psychosis will place the patient under IVC.  CIWA score is elevated.  Blood work is otherwise looking reassuring with a mild transaminitis.  Unclear if this is all psychiatric or if there is a component of alcohol withdrawal/delirium tremens.  Given the fact that he is had seizures in the past we will plan to treat with IV Ativan/CIWA protocol and admit and then move towards psychiatric evaluation.  Patients evaluation and results requires admission for further treatment and care.  Spoke  with hospitalist, reviewed patient's ED course and they accept admission.  Patient agrees with admission plan, offers no new complaints and is stable/unchanged at time of admit.        Final Clinical Impression(s) / ED Diagnoses Final diagnoses:  Hallucinations  Psychosis, unspecified psychosis type (HCC)  Alcohol withdrawal syndrome, with delirium [facility])    Rx / DC Orders ED Discharge Orders     None         Flonnie Humphrey, DO 06/30/23 2333

## 2023-06-30 NOTE — Subjective & Objective (Signed)
 Patient was found at the gas hallucinating Voices are telling him to hurt himself Hx of EtOH abuse and schizophrenia  Last EtOH was 2 days ago  Was noted to be tremulous Improved with Ativan  Hx of DT's in the past  He is IVC'd at this time

## 2023-07-01 ENCOUNTER — Inpatient Hospital Stay (HOSPITAL_COMMUNITY): Payer: MEDICAID

## 2023-07-01 ENCOUNTER — Encounter (HOSPITAL_COMMUNITY): Payer: Self-pay | Admitting: Internal Medicine

## 2023-07-01 ENCOUNTER — Other Ambulatory Visit: Payer: Self-pay

## 2023-07-01 DIAGNOSIS — Z8619 Personal history of other infectious and parasitic diseases: Secondary | ICD-10-CM

## 2023-07-01 DIAGNOSIS — F17200 Nicotine dependence, unspecified, uncomplicated: Secondary | ICD-10-CM

## 2023-07-01 DIAGNOSIS — F1910 Other psychoactive substance abuse, uncomplicated: Secondary | ICD-10-CM | POA: Diagnosis not present

## 2023-07-01 DIAGNOSIS — F209 Schizophrenia, unspecified: Secondary | ICD-10-CM

## 2023-07-01 DIAGNOSIS — L03114 Cellulitis of left upper limb: Secondary | ICD-10-CM

## 2023-07-01 DIAGNOSIS — R9431 Abnormal electrocardiogram [ECG] [EKG]: Secondary | ICD-10-CM

## 2023-07-01 DIAGNOSIS — G2401 Drug induced subacute dyskinesia: Secondary | ICD-10-CM

## 2023-07-01 DIAGNOSIS — E871 Hypo-osmolality and hyponatremia: Secondary | ICD-10-CM | POA: Diagnosis not present

## 2023-07-01 DIAGNOSIS — F10931 Alcohol use, unspecified with withdrawal delirium: Secondary | ICD-10-CM

## 2023-07-01 LAB — COMPREHENSIVE METABOLIC PANEL WITH GFR
ALT: 73 U/L — ABNORMAL HIGH (ref 0–44)
AST: 64 U/L — ABNORMAL HIGH (ref 15–41)
Albumin: 2.9 g/dL — ABNORMAL LOW (ref 3.5–5.0)
Alkaline Phosphatase: 58 U/L (ref 38–126)
Anion gap: 6 (ref 5–15)
BUN: 13 mg/dL (ref 6–20)
CO2: 25 mmol/L (ref 22–32)
Calcium: 8.6 mg/dL — ABNORMAL LOW (ref 8.9–10.3)
Chloride: 100 mmol/L (ref 98–111)
Creatinine, Ser: 0.8 mg/dL (ref 0.61–1.24)
GFR, Estimated: >60 mL/min (ref >60)
Glucose, Bld: 92 mg/dL (ref 70–99)
Potassium: 3.3 mmol/L — ABNORMAL LOW (ref 3.5–5.1)
Sodium: 131 mmol/L — ABNORMAL LOW (ref 135–145)
Total Bilirubin: 1.1 mg/dL (ref 0.0–1.2)
Total Protein: 7.8 g/dL (ref 6.5–8.1)

## 2023-07-01 LAB — URINALYSIS, COMPLETE (UACMP) WITH MICROSCOPIC
Bacteria, UA: NONE SEEN
Bilirubin Urine: NEGATIVE
Glucose, UA: NEGATIVE mg/dL
Ketones, ur: NEGATIVE mg/dL
Leukocytes,Ua: NEGATIVE
Nitrite: NEGATIVE
Protein, ur: NEGATIVE mg/dL
Specific Gravity, Urine: 1.009 (ref 1.005–1.030)
pH: 7 (ref 5.0–8.0)

## 2023-07-01 LAB — ECHOCARDIOGRAM COMPLETE
AR max vel: 2.56 cm2
AV Area VTI: 2.85 cm2
AV Area mean vel: 2.5 cm2
AV Mean grad: 3 mmHg
AV Peak grad: 4.5 mmHg
Ao pk vel: 1.06 m/s
Area-P 1/2: 2.82 cm2
Calc EF: 66.5 %
Height: 69 in
MV VTI: 2.86 cm2
S' Lateral: 3.3 cm
Single Plane A2C EF: 62.4 %
Single Plane A4C EF: 69.4 %
Weight: 2560.86 [oz_av]

## 2023-07-01 LAB — BASIC METABOLIC PANEL WITH GFR
Anion gap: 9 (ref 5–15)
Anion gap: 8 (ref 5–15)
Anion gap: 10 (ref 5–15)
Anion gap: 10 (ref 5–15)
BUN: 14 mg/dL (ref 6–20)
BUN: 10 mg/dL (ref 6–20)
BUN: 10 mg/dL (ref 6–20)
BUN: 7 mg/dL (ref 6–20)
CO2: 23 mmol/L (ref 22–32)
CO2: 24 mmol/L (ref 22–32)
CO2: 23 mmol/L (ref 22–32)
CO2: 21 mmol/L — ABNORMAL LOW (ref 22–32)
Calcium: 8.4 mg/dL — ABNORMAL LOW (ref 8.9–10.3)
Calcium: 8.7 mg/dL — ABNORMAL LOW (ref 8.9–10.3)
Calcium: 9 mg/dL (ref 8.9–10.3)
Calcium: 8.5 mg/dL — ABNORMAL LOW (ref 8.9–10.3)
Chloride: 97 mmol/L — ABNORMAL LOW (ref 98–111)
Chloride: 101 mmol/L (ref 98–111)
Chloride: 102 mmol/L (ref 98–111)
Chloride: 103 mmol/L (ref 98–111)
Creatinine, Ser: 0.85 mg/dL (ref 0.61–1.24)
Creatinine, Ser: 0.6 mg/dL — ABNORMAL LOW (ref 0.61–1.24)
Creatinine, Ser: 0.49 mg/dL — ABNORMAL LOW (ref 0.61–1.24)
Creatinine, Ser: 0.54 mg/dL — ABNORMAL LOW (ref 0.61–1.24)
GFR, Estimated: >60 mL/min (ref >60)
GFR, Estimated: >60 mL/min (ref >60)
GFR, Estimated: >60 mL/min (ref >60)
GFR, Estimated: >60 mL/min (ref >60)
Glucose, Bld: 99 mg/dL (ref 70–99)
Glucose, Bld: 97 mg/dL (ref 70–99)
Glucose, Bld: 101 mg/dL — ABNORMAL HIGH (ref 70–99)
Glucose, Bld: 130 mg/dL — ABNORMAL HIGH (ref 70–99)
Potassium: 3.2 mmol/L — ABNORMAL LOW (ref 3.5–5.1)
Potassium: 3.5 mmol/L (ref 3.5–5.1)
Potassium: 3.7 mmol/L (ref 3.5–5.1)
Potassium: 3.8 mmol/L (ref 3.5–5.1)
Sodium: 129 mmol/L — ABNORMAL LOW (ref 135–145)
Sodium: 133 mmol/L — ABNORMAL LOW (ref 135–145)
Sodium: 135 mmol/L (ref 135–145)
Sodium: 134 mmol/L — ABNORMAL LOW (ref 135–145)

## 2023-07-01 LAB — CK: Total CK: 191 U/L (ref 49–397)

## 2023-07-01 LAB — CBC
HCT: 39.1 % (ref 39.0–52.0)
Hemoglobin: 13 g/dL (ref 13.0–17.0)
MCH: 32.8 pg (ref 26.0–34.0)
MCHC: 33.2 g/dL (ref 30.0–36.0)
MCV: 98.7 fL (ref 80.0–100.0)
Platelets: 381 10*3/uL (ref 150–400)
RBC: 3.96 MIL/uL — ABNORMAL LOW (ref 4.22–5.81)
RDW: 13.2 % (ref 11.5–15.5)
WBC: 8.4 10*3/uL (ref 4.0–10.5)
nRBC: 0 % (ref 0.0–0.2)

## 2023-07-01 LAB — PROTIME-INR
INR: 1.1 (ref 0.8–1.2)
Prothrombin Time: 14 s (ref 11.4–15.2)

## 2023-07-01 LAB — AMMONIA: Ammonia: 11 umol/L (ref 9–35)

## 2023-07-01 LAB — HEPATITIS PANEL, ACUTE
HCV Ab: REACTIVE — AB
Hep A IgM: NONREACTIVE
Hep B C IgM: NONREACTIVE
Hepatitis B Surface Ag: NONREACTIVE

## 2023-07-01 LAB — MRSA NEXT GEN BY PCR, NASAL: MRSA by PCR Next Gen: DETECTED — AB

## 2023-07-01 LAB — SODIUM, URINE, RANDOM: Sodium, Ur: 19 mmol/L

## 2023-07-01 LAB — OSMOLALITY: Osmolality: 279 mosm/kg (ref 275–295)

## 2023-07-01 LAB — PREALBUMIN: Prealbumin: 10 mg/dL — ABNORMAL LOW (ref 18–38)

## 2023-07-01 LAB — PHOSPHORUS
Phosphorus: 3.1 mg/dL (ref 2.5–4.6)
Phosphorus: 3.6 mg/dL (ref 2.5–4.6)

## 2023-07-01 LAB — CREATININE, URINE, RANDOM: Creatinine, Urine: 49 mg/dL

## 2023-07-01 LAB — MAGNESIUM
Magnesium: 2.1 mg/dL (ref 1.7–2.4)
Magnesium: 2.3 mg/dL (ref 1.7–2.4)

## 2023-07-01 LAB — TSH: TSH: 1.212 u[IU]/mL (ref 0.350–4.500)

## 2023-07-01 MED ORDER — DEXMEDETOMIDINE HCL IN NACL 200 MCG/50ML IV SOLN
0.0000 ug/kg/h | INTRAVENOUS | Status: DC
  Administered 2023-07-01: 0.4 ug/kg/h via INTRAVENOUS
  Administered 2023-07-01 – 2023-07-02 (×2): 0.5 ug/kg/h via INTRAVENOUS
  Administered 2023-07-02: 0.7 ug/kg/h via INTRAVENOUS
  Administered 2023-07-02: 0.8 ug/kg/h via INTRAVENOUS
  Administered 2023-07-02: 0.6 ug/kg/h via INTRAVENOUS
  Administered 2023-07-02: 0.8 ug/kg/h via INTRAVENOUS
  Filled 2023-07-01 (×8): qty 50

## 2023-07-01 MED ORDER — CHLORHEXIDINE GLUCONATE CLOTH 2 % EX PADS
6.0000 | MEDICATED_PAD | Freq: Every day | CUTANEOUS | Status: DC
  Administered 2023-07-01 – 2023-07-02 (×2): 6 via TOPICAL

## 2023-07-01 MED ORDER — HALOPERIDOL 1 MG PO TABS: 5.0000 mg | ORAL_TABLET | Freq: Three times a day (TID) | ORAL | Status: DC | PRN

## 2023-07-01 MED ORDER — BOOST / RESOURCE BREEZE PO LIQD CUSTOM
1.0000 | Freq: Three times a day (TID) | ORAL | Status: DC
  Administered 2023-07-02: 1 via ORAL

## 2023-07-01 MED ORDER — DEXTROSE IN LACTATED RINGERS 5 % IV SOLN: INTRAVENOUS | Status: DC

## 2023-07-01 MED ORDER — SODIUM CHLORIDE 0.9 % IV SOLN
2.0000 g | Freq: Once | INTRAVENOUS | Status: AC
  Administered 2023-07-01: 2 g via INTRAVENOUS
  Filled 2023-07-01: qty 12.5

## 2023-07-01 MED ORDER — VANCOMYCIN HCL IN DEXTROSE 1-5 GM/200ML-% IV SOLN: 1000.0000 mg | Freq: Once | INTRAVENOUS | Status: DC

## 2023-07-01 MED ORDER — HALOPERIDOL LACTATE 5 MG/ML IJ SOLN
5.0000 mg | Freq: Three times a day (TID) | INTRAMUSCULAR | Status: DC | PRN
  Administered 2023-07-02: 5 mg via INTRAMUSCULAR
  Filled 2023-07-01: qty 1

## 2023-07-01 MED ORDER — ACETAMINOPHEN 325 MG PO TABS: 650.0000 mg | ORAL_TABLET | Freq: Four times a day (QID) | ORAL | Status: DC | PRN

## 2023-07-01 MED ORDER — CEFEPIME HCL 2 G IV SOLR: 2.0000 g | Freq: Once | INTRAVENOUS | Status: DC

## 2023-07-01 MED ORDER — NICOTINE 21 MG/24HR TD PT24
21.0000 mg | MEDICATED_PATCH | Freq: Every day | TRANSDERMAL | Status: DC
  Administered 2023-07-01 – 2023-07-05 (×5): 21 mg via TRANSDERMAL
  Filled 2023-07-01 (×5): qty 1

## 2023-07-01 MED ORDER — LORAZEPAM 2 MG/ML IJ SOLN
0.0000 mg | INTRAMUSCULAR | Status: DC | PRN
  Administered 2023-07-01: 4 mg via INTRAVENOUS
  Administered 2023-07-02 (×2): 3 mg via INTRAVENOUS
  Administered 2023-07-02: 4 mg via INTRAVENOUS
  Filled 2023-07-01: qty 1
  Filled 2023-07-01 (×4): qty 2

## 2023-07-01 MED ORDER — LORAZEPAM 2 MG/ML IJ SOLN
2.0000 mg | Freq: Once | INTRAMUSCULAR | Status: AC
  Administered 2023-07-01: 2 mg via INTRAVENOUS

## 2023-07-01 MED ORDER — VANCOMYCIN HCL 1250 MG/250ML IV SOLN
1250.0000 mg | Freq: Two times a day (BID) | INTRAVENOUS | Status: DC
  Administered 2023-07-01 – 2023-07-02 (×4): 1250 mg via INTRAVENOUS
  Filled 2023-07-01 (×5): qty 250

## 2023-07-01 MED ORDER — LORAZEPAM 1 MG PO TABS
0.0000 mg | ORAL_TABLET | ORAL | Status: DC | PRN
  Administered 2023-07-01: 4 mg via ORAL
  Administered 2023-07-02: 2 mg via ORAL
  Administered 2023-07-05 – 2023-07-06 (×4): 3 mg via ORAL
  Filled 2023-07-01: qty 4
  Filled 2023-07-01: qty 3
  Filled 2023-07-01: qty 2
  Filled 2023-07-01 (×3): qty 3

## 2023-07-01 MED ORDER — DEXMEDETOMIDINE BOLUS VIA INFUSION
1.0000 ug/kg | Freq: Once | INTRAVENOUS | Status: AC
  Administered 2023-07-01: 72.6 ug via INTRAVENOUS
  Filled 2023-07-01: qty 73

## 2023-07-01 MED ORDER — ACETAMINOPHEN 650 MG RE SUPP: 650.0000 mg | Freq: Four times a day (QID) | RECTAL | Status: DC | PRN

## 2023-07-01 MED ORDER — ONDANSETRON HCL 4 MG/2ML IJ SOLN: 4.0000 mg | Freq: Four times a day (QID) | INTRAMUSCULAR | Status: DC | PRN

## 2023-07-01 MED ORDER — DEXTROSE 5 % IV SOLN: INTRAVENOUS | Status: DC

## 2023-07-01 MED ORDER — LACTATED RINGERS IV BOLUS: 1000.0000 mL | Freq: Once | INTRAVENOUS | Status: DC

## 2023-07-01 MED ORDER — SODIUM CHLORIDE 0.9 % IV SOLN: INTRAVENOUS | Status: AC

## 2023-07-01 MED ORDER — GABAPENTIN 400 MG PO CAPS
400.0000 mg | ORAL_CAPSULE | Freq: Two times a day (BID) | ORAL | Status: DC
  Administered 2023-07-01 – 2023-07-05 (×9): 400 mg via ORAL
  Filled 2023-07-01 (×10): qty 1

## 2023-07-01 MED ORDER — LACTATED RINGERS IV BOLUS
500.0000 mL | Freq: Once | INTRAVENOUS | Status: AC
  Administered 2023-07-01: 500 mL via INTRAVENOUS

## 2023-07-01 MED ORDER — VANCOMYCIN HCL IN DEXTROSE 1-5 GM/200ML-% IV SOLN
1000.0000 mg | Freq: Once | INTRAVENOUS | Status: AC
Start: 2023-07-01 — End: 2023-07-01
  Administered 2023-07-01: 1000 mg via INTRAVENOUS
  Filled 2023-07-01: qty 200

## 2023-07-01 MED ORDER — ONDANSETRON HCL 4 MG PO TABS: 4.0000 mg | ORAL_TABLET | Freq: Four times a day (QID) | ORAL | Status: DC | PRN

## 2023-07-01 MED ORDER — OLANZAPINE 5 MG PO TABS: 5.0000 mg | ORAL_TABLET | Freq: Two times a day (BID) | ORAL | Status: DC | PRN

## 2023-07-01 MED ORDER — HALOPERIDOL LACTATE 5 MG/ML IJ SOLN
5.0000 mg | Freq: Four times a day (QID) | INTRAMUSCULAR | Status: DC | PRN
  Administered 2023-07-01: 5 mg via INTRAMUSCULAR
  Filled 2023-07-01: qty 1

## 2023-07-01 MED ORDER — SODIUM CHLORIDE 0.9 % IV SOLN
2.0000 g | Freq: Three times a day (TID) | INTRAVENOUS | Status: DC
  Administered 2023-07-01 – 2023-07-03 (×7): 2 g via INTRAVENOUS
  Filled 2023-07-01 (×7): qty 12.5

## 2023-07-01 NOTE — Progress Notes (Signed)
 Pharmacy Antibiotic Note  Jerry [name] is a 48 y.o. male admitted on 06/30/2023 with alcohol withdrawal with delirium and found to have left arm cellulitis.  Noted IVDA.  Pharmacy has been consulted for Vancomycin and Cefepime dosing.  Patient current weight unknown while in ED Scr = 0.93  Plan: Vancomycin 1gm IV x 1 ordered initially Cefepime 2gm IV q8h F/U weight once documented in EPIC and will order further vancomycin at that time.     Temp (24hrs), Avg:98.5 F (36.9 C), Min:98.5 F (36.9 C), Max:98.5 F (36.9 C)  Recent Labs  Lab 06/30/23 2052  WBC 11.7*  CREATININE 0.93    CrCl cannot be calculated (Unknown ideal weight.).    Allergies  Allergen Reactions   Penicillins Other (See Comments) and Swelling    Reaction occurred in childhood   Suboxone [Buprenorphine Hcl-Naloxone Hcl] Other (See Comments)    "Made me feel terrible"    Antimicrobials this admission: 4/13 Cefepime >>   4/13 Vancomycin >>    Dose adjustments this admission:    Microbiology results: 4/13 BCx:      Thank you for allowing pharmacy to be a part of this patient's care.  Rulon Councilman, PharmD 07/01/2023 3:27 AM

## 2023-07-01 NOTE — Assessment & Plan Note (Signed)
 -  Spoke about importance of quitting spent 5 minutes discussing options for treatment, prior attempts at quitting, and dangers of smoking ? -At this point patient is    NOT  interested in quitting ? - order nicotine patch  ? - nursing tobacco cessation protocol ? ?

## 2023-07-01 NOTE — Progress Notes (Signed)
  Echocardiogram 2D Echocardiogram has been performed.  Ijanae Macapagal L Anivea Velasques RDCS 07/01/2023, 3:19 PM

## 2023-07-01 NOTE — Progress Notes (Signed)
 Patient arrived to floor and was resting comfortably. He was able to answer all questions correctly and followed commands. Out of nowhere he began saying that "Kelle Pate is coming to kill me. He will shoot me in the back of the head two times." He became extremely agitated and attempted to get out of bed. He was yelling and adamant about getting his clothes, calling the police and leaving. Multiple nurses at bedside trying to calm him down and reassure him. Dr. Rosaline Coma notified. 2mg  ativan given. CIWA changed to q1h and additional 4mg  ativan given as first dose had no effect. Patient placed in wrist restraints. Sitter and nurse remain at bedside.

## 2023-07-01 NOTE — Assessment & Plan Note (Signed)
 Appreciate transitional care consult

## 2023-07-01 NOTE — Assessment & Plan Note (Signed)
 As per prior protocol pt is supposed to be on   sertraline to 150 MG daily   -Continue gabapentin 400 MG bid    prazosin 3 mg at bedtime   Unclear if patient is actually compliant with any of his medications will defer to psychiatry regarding which medications to continue

## 2023-07-01 NOTE — Assessment & Plan Note (Addendum)
 Per psychiatry note patient supposed to be on Ingrezza to 80 mg daily  and benztropine 1 mg bid prn for tremors/stiffness Unclear if he takes any of his medications

## 2023-07-01 NOTE — Consult Note (Addendum)
 [facility] Psychiatric Consult Initial  Patient Name: .Jerry [name]  MRN: [id number]  DOB: [birth year]  Consult Order details:  Orders (From admission, onward)     Start     Ordered   07/01/23 0045  IP CONSULT TO PSYCHIATRY       Ordering Provider: Doutova, Anastassia, MD  Provider:  (Not yet assigned)  Question Answer Comment  Location [facility]   Reason for Consult? psychosis      07/01/23 0044             Mode of Visit: In person    Psychiatry Consult Evaluation  Service Date: July 01, 2023 LOS:  LOS: 1 day  Chief Complaint Psychosis  Primary Psychiatric Diagnoses  Alcohol Withdrawal DT 2.  Decompensated Schizophrenia  Assessment  Jerry [name] is a 48 y.o. male admitted: Medicallyfor 06/30/2023  8:38 PM for hallucination. Jerry [name] is a 48 y.o. male with medical history significant of alcohol abuse and schizophrenia history of DTs history of polysubstance abuse, GAD tobacco abuse, malingering, PTSD, tardive dyskinesia. Patient was reportedly picked up at the gas station and was brought to the hospital by the PD.   His current presentation of hallucination, tremors is most consistent with alcohol withdrawal DT and possibly Schizophrenia decompensation in the setting of treatment noncompliance. Not currently on any home psychotropics. Last alcohol use was reported to be about 3 days ago and he presented tremulous to the hospital. On initial examination, patient very agitated, grossly disorganized and unable to engage in interview. He was observed pulling the restraint and having conversation with "Jerry [name]" a man who was trying to harm him, he stated.   Please see plan below for detailed recommendations.   Diagnoses:  Active Hospital problems: Principal Problem:   Alcohol withdrawal (HCC) Active Problems:   Alcohol abuse   Polysubstance abuse (HCC)   Depression, unspecified   Tobacco use disorder   Homelessness   Schizophrenia (HCC)   Tardive  dyskinesia   Hyponatremia   History of hepatitis C virus infection   Left arm cellulitis    Plan   ## Psychiatric Medication Recommendations:  -- Start Haldol 5 mg PO/IM q 8 hrs PRN for agitation -- Continue Neurontin 400 mg po bid -- continue Thiamine 100 mg  daily -- Continue to monitor on CIWA protocol -- Refer to [facility] once detox is completed for dual diagnosis   ## Medical Decision Making Capacity: Not specifically addressed in this encounter  ## Further Work-up:  -- most recent EKG on 4/12 had QtC of 446 -- Pertinent labwork reviewed earlier this admission includes: elevated LFT, NA- 133   ## Disposition:-- We recommend transfer to [facility].  ## Behavioral / Environmental: -Delirium Precautions: Delirium Interventions for Nursing and Staff: - RN to open blinds every AM. - To Bedside: Glasses, hearing aide, and pt's own shoes. Make available to patients. when possible and encourage use. - Encourage po fluids when appropriate, keep fluids within reach. - OOB to chair with meals. - Passive ROM exercises to all extremities with AM & PM care. - RN to assess orientation to person, time and place QAM and PRN. - Recommend extended visitation hours with familiar family/friends as feasible. - Staff to minimize disturbances at night. Turn off television when pt asleep or when not in use.    ## Safety and Observation Level:  - Based on my clinical evaluation, I estimate the patient to be at high risk of self  harm in the current setting. - At this time, we recommend  1:1 Observation. This decision is based on my review of the chart including patient's history and current presentation, interview of the patient, mental status examination, and consideration of suicide risk including evaluating suicidal ideation, plan, intent, suicidal or self-harm behaviors, risk factors, and protective factors. This judgment is based on our ability to directly address suicide risk,  implement suicide prevention strategies, and develop a safety plan while the patient is in the clinical setting. Please contact our team if there is a concern that risk level has changed.  CSSR Risk Category:C-SSRS RISK CATEGORY: No Risk  Suicide Risk Assessment: Patient has following modifiable risk factors for suicide: untreated depression and medication noncompliance, substance and alcohol use which we are addressing by provision of alcohol detox and monitoring. Patient has following non-modifiable or demographic risk factors for suicide: male gender, history of suicide attempt, and psychiatric hospitalization Patient has the following protective factors against suicide: none identified  Thank you for this consult request. Recommendations have been communicated to the primary team.  We will follow up  at this time.   Jerry Justice, NP       History of Present Illness  Relevant Aspects of [facility] Course:  Admitted on 06/30/2023 for Hallucination.   Patient Report: patient found in bed agitated, pulling on restraint. He was unable to participate in interview. He repeatedly asked to be taken to [facility]. He believed that a man named Jerry [name] was after him and was actively talking with the him.    Psych ROS:  Depression: Unable to assess  Anxiety:  Unable to assess  Mania (lifetime and current): Unable to assess  Psychosis: (lifetime and current): +hallucination  Collateral information:  Contacted none  ROS   Psychiatric and Social History  Psychiatric History:  Information collected from patient, staff and chart review  Prev Dx/Sx: alcohol abuse and schizophrenia history of DTs history of polysubstance abuse, GAD tobacco abuse, malingering, PTSD, tardive dyskinesia Current Psych Provider: Dr. Ethel [name] outpatient hx, currently unknown Home Meds (current):  Previous Med Trials: risperidone  Therapy: hx therapist-Jerry [name] Job, currently unknown   Prior Psych  Hospitalization: About 3 lifetime psychiatric hospitalizations Several lifetime episodes of suicidal ideation/attempts, including jumping in front of traffic at least once.  Prior Violence: unknown  Family Psych History: unknown Family Hx suicide: unknown  Social History:  Developmental Hx: unknown Educational Hx: grade education  Occupational Hx: unemployed Armed forces operational officer Hx: unknown Living Situation: homeless Spiritual Hx: unknown Access to weapons/lethal means: unknown   Substance History Alcohol: alcohol dependence  Type of alcohol -unknown Last Drink- unknown Number of drinks per day -unknown History of alcohol withdrawal seizures - History of DT's -hx of DTs Tobacco: unknown Illicit drugs: marijuana every couple of weeks, cocaine about the same, meth per chart hx Prescription drug abuse: unknown Rehab hx: multiple  Exam Findings  Physical Exam:  Vital Signs:  Temp:  [97.2 F (36.2 C)-98.5 F (36.9 C)] 97.6 F (36.4 C) (04/13 0900) Pulse Rate:  [68-111] 98 (04/13 1131) Resp:  [15-24] 20 (04/13 1100) BP: (107-175)/(55-150) 120/78 (04/13 1131) SpO2:  [96 %-100 %] 100 % (04/13 1100) Weight:  [72.6 kg] 72.6 kg (04/13 0943) Blood pressure 120/78, pulse 98, temperature 97.6 F (36.4 C), temperature source Axillary, resp. rate 20, height 5\' 9"  (1.753 m), weight 72.6 kg, SpO2 100%. Body mass index is 23.64 kg/m.  Physical Exam  Mental Status Exam: General Appearance: Disheveled  Orientation:  Other:  self and place  Memory:  Immediate;   Poor  Concentration:  Concentration: Poor  Recall:  Poor  Attention  Poor  Eye Contact:  Poor  Speech:  Slurred  Language:  Fair  Volume:  Increased  Mood: not stated  Affect:  Labile  Thought Process:  Disorganized and Descriptions of Associations: Loose  Thought Content:  Illogical, Hallucinations: Auditory Visual, and Paranoid Ideation  Suicidal Thoughts:   unable to assess  Homicidal Thoughts:  unable to assess  Judgement:   Impaired  Insight:  Lacking  Psychomotor Activity:   agitation  Akathisia:  No  Fund of Knowledge:   unable to assess      Assets:  Others:  unable to assess  Cognition:  Impaired,  Severe  ADL's:  Impaired  AIMS (if indicated):        Other History   These have been pulled in through the EMR, reviewed, and updated if appropriate.  Family History:  The patient's family history includes Cancer in his mother; Diabetes in his father and maternal grandmother; Heart disease in his father and mother; Lung cancer in his mother; Stroke in his paternal grandfather.  Medical History: Past Medical History:  Diagnosis Date   Anxiety    Bipolar 1 disorder (HCC)    Depression    Drug-induced seizure (HCC)    EtOH dependence (HCC)    Hallucination    Paranoid schizophrenia (HCC)    since pt's 20's   PTSD (post-traumatic stress disorder)     Surgical History: Past Surgical History:  Procedure Laterality Date   KIDNEY DONATION Right 2003     Medications:   Current Facility-Administered Medications:    acetaminophen (TYLENOL) tablet 650 mg, 650 mg, Oral, Q6H PRN **OR** acetaminophen (TYLENOL) suppository 650 mg, 650 mg, Rectal, Q6H PRN, Doutova, Anastassia, MD   ceFEPIme (MAXIPIME) 2 g in sodium chloride 0.9 % 100 mL IVPB, 2 g, Intravenous, Q8H, Poindexter, Leann T, RPH, Stopped at 07/01/23 1129   Chlorhexidine Gluconate Cloth 2 % PADS 6 each, 6 each, Topical, Daily, Swayze, Ava, DO, 6 each at 07/01/23 1200   dexmedetomidine (PRECEDEX) 200 MCG/50ML (4 mcg/mL) infusion, 0-1.2 mcg/kg/hr, Intravenous, Titrated, Swayze, Ava, DO, Last Rate: 7.26 mL/hr at 07/01/23 1214, 0.4 mcg/kg/hr at 07/01/23 1214   feeding supplement (BOOST / RESOURCE BREEZE) liquid 1 Container, 1 Container, Oral, TID BM, Swayze, Ava, DO   gabapentin (NEURONTIN) capsule 400 mg, 400 mg, Oral, BID, Doutova, Anastassia, MD, 400 mg at 07/01/23 0135   haloperidol (HALDOL) tablet 5 mg, 5 mg, Oral, Q8H PRN **OR** haloperidol  lactate (HALDOL) injection 5 mg, 5 mg, Intramuscular, Q6H PRN, Kylyn Mcdade, NP, 5 mg at 07/01/23 1149   [START ON 07/03/2023] LORazepam (ATIVAN) injection 0-4 mg, 0-4 mg, Intravenous, Q12H **OR** [START ON 07/03/2023] LORazepam (ATIVAN) tablet 0-4 mg, 0-4 mg, Oral, Q12H, Doutova, Anastassia, MD   LORazepam (ATIVAN) injection 0-4 mg, 0-4 mg, Intravenous, Q1H PRN, 4 mg at 07/01/23 0927 **OR** LORazepam (ATIVAN) tablet 0-4 mg, 0-4 mg, Oral, Q1H PRN, Swayze, Ava, DO, 4 mg at 07/01/23 1121   nicotine (NICODERM CQ - dosed in mg/24 hours) patch 21 mg, 21 mg, Transdermal, Daily, Doutova, Anastassia, MD, 21 mg at 07/01/23 1042   ondansetron (ZOFRAN) tablet 4 mg, 4 mg, Oral, Q6H PRN **OR** ondansetron (ZOFRAN) injection 4 mg, 4 mg, Intravenous, Q6H PRN, Doutova, Anastassia, MD   thiamine (VITAMIN B1) tablet 100 mg, 100 mg, Oral, Daily **OR** thiamine (VITAMIN B1) injection 100 mg, 100 mg, Intravenous, Daily, Doutova,  Anastassia, MD, 100 mg at 07/01/23 1050   vancomycin (VANCOREADY) IVPB 1250 mg/250 mL, 1,250 mg, Intravenous, Q12H, Jaquil, [place], [facility], Stopped at 07/01/23 1246  Allergies: Allergies  Allergen Reactions   Penicillins Other (See Comments) and Swelling    Reaction occurred in childhood    Orla Estrin, NP

## 2023-07-01 NOTE — Progress Notes (Signed)
   07/01/23 0819  TOC Brief Assessment  Insurance and Status Reviewed  Patient has primary care physician Yes  Home environment has been reviewed From home with spouse  Prior level of function: Independent  Prior/Current Home Services No current home services  Social Drivers of Health Review SDOH reviewed no interventions necessary  Readmission risk has been reviewed Yes (Yellow)  Transition of care needs transition of care needs identified, TOC will continue to follow (SA resources/education added to AVS. TOC consult active.)

## 2023-07-01 NOTE — Assessment & Plan Note (Signed)
 Unclear if has has been treated Given elevated LFTs obtain liver ultrasound

## 2023-07-01 NOTE — Assessment & Plan Note (Signed)
 CIWA protocol monitor closely given hx of severe DT and heavy EtOh

## 2023-07-01 NOTE — Progress Notes (Addendum)
 Progress Note   Patient: Jerry [name] WUJ:[id number] DOB: December 05, 1975 DOA: 06/30/2023     1 DOS: the patient was seen and examined on 07/01/2023   Brief hospital course: The patient is a 48 yr old man who was brought to [facility] ED by PD from a gas station due to what appeared to be a psychotic episode. The patient was displaying evidence of visual and auditory hallucinations. The voices are paranoid and are telling him that there is someone who has been paid 7500 dollars to kill him.   The patient has a past medical history significant for alcohol abuse and schizophrenia. He also has a history of DT's and polysubstance abuse, GAD, tobacco abuse, malingering, PTSD, and tardive dyskinesia.  He was found to be in DT's in the ED with severe tremors. This improved with Ativan.  His last drink was 3 days ago.   He was admitted to the ICU on CIWA protocol. Since admission the patient has been severely agitated frequently scoring in the 20's for CIWA. He has been oppositional, combative and threatening. He had received 8 mg of ativan by 11:00 am this morning with no effect. The patient has been placed on a precedex drip.  The patient has been evaluated by psychiatry who has recommended admission to [facility] once he has completed Detox. In the meantime the have recommended neurontin 400 mg bid, Haldol 5.0 mg IM or PO q 8 hours.   Assessment and Plan:  Alcohol Withdrawal:  Acute and severe requiring physical restraints as well as precedex drip. CIWA protocol is ordered. The patient is having active paranoid hallucinations visual and auditory. He has been verbally and physically threatening and abusive to staff. He is kicking and thrashing about in bed. The patient's management had to be escalated from CIWA protocol with ativan to Precedex drip with loading dose. He is also receiving Thiamine and Folate.  Hypotension:  Likely due to poor fluid intake since admission and effect of ativan  and precedex. The patient will be given a 1L bolus of LR. Monitor blood pressure, creatinine, electrolytes, and volume status.  Severe Protein Calorie Malnutrition:  Pt with cachexia and temporal wasting. Prealbumin less than 10. Dietician consult. Continue IV fluids with D5. Monitor electrolytes.  Left arm Cellulitis:  The patient is receiving Cefepime 2 gm IV q 8 hours.. Blood culture x 2 have been drawn. The patient has screened positive for MRSA, but this cellulitis does no appear purulent.  Hyponatremia: Likely due to alcohol. Na has increased from 124 upon admission at 2000 to 133 this afternoon at 1300. He is correcting too quickly. Will add D5W at 125 cc/hr. Will monitor chemistry carefully.  Tobacco abuse:  Nicotine patch has been applied.  Schizophrenia: Haldol 5 mg PO or IM as per psychiatry recommendations. Pt will ben transferred to [facility] health unit after he is out of detox. The patient has been displaying visual and auditory hallucinations.     Subjective: Pt is sitting up at bed. He is extremely agitated. His speech is very pressured and largely unintelligible. I am able to comprehend that he is very concerned that there is a contract out to kill him.   Physical Exam: Vitals:   07/01/23 1230 07/01/23 1253 07/01/23 1300 07/01/23 1421  BP: 118/73 118/73    Pulse:  98    Resp:  20    Temp:  (!) 97 F (36.1 C) (!) 97 F (36.1 C) (!) 97 F (36.1 C)  TempSrc:  Oral Axillary Axillary  SpO2:      Weight:      Height:       Exam:  Constitutional:  The patient is awake, alert, confused, agitated, and hallucinating. Severe distress. Head             Temporal Wasting Eyes:  pupils and irises appear normal Normal lids and conjunctivae Neck:  neck appears normal, no masses, normal ROM, supple no thyromegaly Respiratory:  The patient is tachypneic. No wheezes, rales, or rhonchi No tactile fremitus Cardiovascular:  Rate is fast. No murmurs, ectopy, or  gallups. No lateral PMI. No thrills. Abdomen:  Abdomen is soft, non-tender, non-distended Abdomen is scaffoid. No hernias, masses, or organomegaly Normoactive bowel sounds.  Musculoskeletal:  No cyanosis, clubbing, or edema Cachectic Skin:  No rashes, lesions, ulcers palpation of skin: no induration or nodules Neurologic:  Patient is unable to cooperate with exam. Psychiatric:  Agitated Psychotic Hallucinating Nonsensical  Data Reviewed:  Telemetry CBC BMP  Family Communication: None available  Disposition: Status is: Inpatient Remains inpatient appropriate because: Pt is in DT's and requires close monitoring and treatment or suffer organ system deterioration or death.  Planned Discharge Destination:  [facility] Unit    Time spent: 42 minutes  Author: Calvary Difranco, DO 07/01/2023 3:01 PM  For on call review [email or web address].

## 2023-07-01 NOTE — Assessment & Plan Note (Signed)
 Could be potentially secondary to injections.  Obtain x-ray to evaluate for foreign body. For tonight cover broadly with cefepime Vanco Blood cultures obtained At this time no joint stiffness pain noted

## 2023-07-01 NOTE — Assessment & Plan Note (Signed)
 Possibly contributing to current symptoms appreciate psychiatry evaluation and treatment

## 2023-07-01 NOTE — Progress Notes (Signed)
 A consult was placed to the IV Nurse for additional iv access and labs;  pt has a 20ga in the LAC; area of redness noted below the left AC;  Right arm assessed with ultrasound; no suitable veins noted, except possibly a brachial or basilic vein in the RUA;  difficult to assess, as pt became awake, trying to sit up, moving in the bed; pt is in restraints;  attempted x 1 in LUA without success; sitter at bedside.  No further attempts made;

## 2023-07-01 NOTE — Assessment & Plan Note (Signed)
 Hallucinations could be part of alcohol withdrawal CIWA protocol ordered Would benefit from behavioral health consult

## 2023-07-01 NOTE — Progress Notes (Signed)
 At bedside to place PICC line.  Assessed BUE, veins currently too small measuring 45% or greater occupancy on RUE.  LUE with cellulitis noted in forearm.  Pt continues to be restless.  Since veins too small at this time, no additional meds requested to calm pt for procedure.  Will notify Monday PICC team for reassess post hydration.

## 2023-07-01 NOTE — Progress Notes (Signed)
 PICC ordered for poor IV access. No family listed in chart and pt sedated unable to give consent.  Secure chat with Dr Rosaline Coma, orders okay to place PICC per medical necessity.

## 2023-07-01 NOTE — Assessment & Plan Note (Signed)
 In the setting of alcohol abuse and possibly liver disease.  Obtain electrolytes continue to follow carefully

## 2023-07-01 NOTE — Assessment & Plan Note (Signed)
 Patient is endorsing injecting drugs Given abnormal EKG obtain echogram

## 2023-07-01 NOTE — Progress Notes (Signed)
 Pharmacy Antibiotic Note  Jerry [name] is a 48 y.o. male admitted on 06/30/2023 with alcohol withdrawal with delirium and found to have left arm cellulitis.  Noted IVDA.  Pharmacy consulted for Vancomycin and Cefepime dosing.  Patient current weight unknown while in ED 06/25/2023 weight: 71.2 kg  Plan: Vancomycin 1gm IV x 1 ordered initially  Then continue vancomycin at 1250 mg IV q12h (Goal AUC 400-550, eAUC 491.9, wt used: 71.2 kg, SCr used 0.8) Continue Cefepime 2gm IV q8h Monitor clinical progress, renal function, vancomycin levels as indicated F/U C&S, abx deescalation / LOT   Height: 5\' 9"  (175.3 cm) IBW/kg (Calculated) : 70.7  Temp (24hrs), Avg:98.3 F (36.8 C), Min:98 F (36.7 C), Max:98.5 F (36.9 C)  Recent Labs  Lab 06/30/23 2052 07/01/23 0356 07/01/23 0634  WBC 11.7*  --  8.4  CREATININE 0.93 0.85 0.80    CrCl cannot be calculated (Unknown ideal weight.).   Based on wt 71.2, est CrCl > 139ml/min  Allergies  Allergen Reactions   Penicillins Other (See Comments) and Swelling    Reaction occurred in childhood    Antimicrobials this admission: 4/13 Cefepime >>   4/13 Vancomycin >>    Dose adjustments this admission:    Microbiology results: 4/13 BCx:  sent    Thank you for allowing pharmacy to be a part of this patient's care.  Delpha Fickle, PharmD, BCPS 07/01/2023 7:29 AM

## 2023-07-02 DIAGNOSIS — F10931 Alcohol use, unspecified with withdrawal delirium: Secondary | ICD-10-CM | POA: Diagnosis not present

## 2023-07-02 LAB — CBC WITH DIFFERENTIAL/PLATELET
Abs Immature Granulocytes: 0.03 10*3/uL (ref 0.00–0.07)
Basophils Absolute: 0.1 10*3/uL (ref 0.0–0.1)
Basophils Relative: 1 %
Eosinophils Absolute: 0.4 10*3/uL (ref 0.0–0.5)
Eosinophils Relative: 5 %
HCT: 44.9 % (ref 39.0–52.0)
Hemoglobin: 15.3 g/dL (ref 13.0–17.0)
Immature Granulocytes: 0 %
Lymphocytes Relative: 28 %
Lymphs Abs: 2.3 10*3/uL (ref 0.7–4.0)
MCH: 33.6 pg (ref 26.0–34.0)
MCHC: 34.1 g/dL (ref 30.0–36.0)
MCV: 98.7 fL (ref 80.0–100.0)
Monocytes Absolute: 1.5 10*3/uL — ABNORMAL HIGH (ref 0.1–1.0)
Monocytes Relative: 18 %
Neutro Abs: 3.8 10*3/uL (ref 1.7–7.7)
Neutrophils Relative %: 48 %
Platelets: 305 10*3/uL (ref 150–400)
RBC: 4.55 MIL/uL (ref 4.22–5.81)
RDW: 13.2 % (ref 11.5–15.5)
WBC: 8 10*3/uL (ref 4.0–10.5)
nRBC: 0 % (ref 0.0–0.2)

## 2023-07-02 LAB — BASIC METABOLIC PANEL WITH GFR
Anion gap: 8 (ref 5–15)
Anion gap: 8 (ref 5–15)
BUN: 6 mg/dL (ref 6–20)
BUN: 5 mg/dL — ABNORMAL LOW (ref 6–20)
CO2: 21 mmol/L — ABNORMAL LOW (ref 22–32)
CO2: 23 mmol/L (ref 22–32)
Calcium: 8.9 mg/dL (ref 8.9–10.3)
Calcium: 8.9 mg/dL (ref 8.9–10.3)
Chloride: 105 mmol/L (ref 98–111)
Chloride: 104 mmol/L (ref 98–111)
Creatinine, Ser: 0.76 mg/dL (ref 0.61–1.24)
Creatinine, Ser: 0.64 mg/dL (ref 0.61–1.24)
GFR, Estimated: >60 mL/min (ref >60)
GFR, Estimated: >60 mL/min (ref >60)
Glucose, Bld: 139 mg/dL — ABNORMAL HIGH (ref 70–99)
Glucose, Bld: 122 mg/dL — ABNORMAL HIGH (ref 70–99)
Potassium: 4.5 mmol/L (ref 3.5–5.1)
Potassium: 4.5 mmol/L (ref 3.5–5.1)
Sodium: 134 mmol/L — ABNORMAL LOW (ref 135–145)
Sodium: 135 mmol/L (ref 135–145)

## 2023-07-02 LAB — HCV RNA QUANT
HCV Quantitative Log: 5.083 {Log_IU}/mL (ref >1.70)
HCV Quantitative: 121000 [IU]/mL (ref >50)

## 2023-07-02 LAB — OSMOLALITY, URINE: Osmolality, Ur: 235 mosm/kg — ABNORMAL LOW (ref 300–900)

## 2023-07-02 LAB — GLUCOSE, CAPILLARY: Glucose-Capillary: 131 mg/dL — ABNORMAL HIGH (ref 70–99)

## 2023-07-02 MED ORDER — ORAL CARE MOUTH RINSE: 15.0000 mL | OROMUCOSAL | Status: DC | PRN

## 2023-07-02 MED ORDER — SODIUM CHLORIDE 0.9% FLUSH: 10.0000 mL | INTRAVENOUS | Status: DC | PRN

## 2023-07-02 MED ORDER — DEXTROSE IN LACTATED RINGERS 5 % IV SOLN: INTRAVENOUS | Status: AC

## 2023-07-02 MED ORDER — DEXMEDETOMIDINE HCL IN NACL 400 MCG/100ML IV SOLN
0.0000 ug/kg/h | INTRAVENOUS | Status: DC
  Administered 2023-07-02: 0.7 ug/kg/h via INTRAVENOUS
  Administered 2023-07-03: 0.4 ug/kg/h via INTRAVENOUS
  Administered 2023-07-03: 0.6 ug/kg/h via INTRAVENOUS
  Filled 2023-07-02 (×3): qty 100

## 2023-07-02 MED ORDER — SODIUM CHLORIDE 0.9% FLUSH
10.0000 mL | Freq: Two times a day (BID) | INTRAVENOUS | Status: DC
Start: 2023-07-02 — End: 2023-07-06
  Administered 2023-07-02 – 2023-07-03 (×3): 10 mL
  Administered 2023-07-03: 20 mL
  Administered 2023-07-04: 10 mL
  Administered 2023-07-04: 20 mL
  Administered 2023-07-05: 10 mL
  Administered 2023-07-05: 20 mL

## 2023-07-02 NOTE — Progress Notes (Signed)
 Progress Note   Patient: Jerry [name] [id number] DOB: March 10, 1976 DOA: 06/30/2023     2 DOS: the patient was seen and examined on 07/02/2023   Brief hospital course: The patient is a 48 yr old man who was brought to [facility] ED by PD from a gas station due to what appeared to be a psychotic episode. The patient was displaying evidence of visual and auditory hallucinations. The voices are paranoid and are telling him that there is someone who has been paid 7500 dollars to kill him.   The patient has a past medical history significant for alcohol abuse and schizophrenia. He also has a history of DT's and polysubstance abuse, GAD, tobacco abuse, malingering, PTSD, and tardive dyskinesia.  He was found to be in DT's in the ED with severe tremors. This improved with Ativan.  His last drink was 3 days ago.   He was admitted to the ICU on CIWA protocol. Since admission the patient has been severely agitated frequently scoring in the 20's for CIWA. He has been oppositional, combative and threatening. He had received 8 mg of ativan by 11:00 am this morning with no effect. The patient has been placed on a precedex drip.  The patient has been evaluated by psychiatry who has recommended admission to [facility] once he has completed Detox. In the meantime the have recommended neurontin 400 mg bid, Haldol 5.0 mg IM or PO q 8 hours.   He was given a 500 cc bolus of LR on the evening of 07/01/2023 for hypotension. After this bolus he was given D5 overnight to slow the correction of his hyponatremia.   NA on the morning of 07/02/2023 was 134 which was appropriate. The patient was sedated on precedex gtt.   Assessment and Plan:  Alcohol Withdrawal:  Acute and severe requiring physical restraints as well as precedex drip. CIWA protocol is ordered. The patient is having active paranoid hallucinations visual and auditory. He has been verbally and physically threatening and abusive to staff. He is  kicking and thrashing about in bed. The patient's management had to be escalated from CIWA protocol with ativan to Precedex drip with loading dose. He is also receiving Thiamine and Folate.  On the morning of 07/02/2023 the patient was sedated on the precedex drip. PICC was placed this afternoon. The patient had a CIWA of 26 when he was off of precedex for a short time. PCCM has been consulted as the patient is almost at 24 hours on the precedex drip and is not close to being weaned off.   Hypotension:  Resolved. Likely due to poor fluid intake since admission and effect of ativan and precedex. The patient was given a 1L bolus of LR on the evening of 07/01/2023. Monitor blood pressure, creatinine, electrolytes, and volume status.  Severe Protein Calorie Malnutrition:  Pt with cachexia and temporal wasting. Prealbumin less than 10. Dietician consult. Continue IV fluids with D5. Monitor electrolytes. RD consulted to assist.   Left arm Cellulitis:  The patient is receiving Cefepime 2 gm IV q 8 hours.. Blood culture x 2 have been drawn. The patient has screened positive for MRSA, but this cellulitis does no appear purulent.  Hyponatremia: Likely due to alcohol. Na has increased from 124 upon admission at 2000 to 133 this afternoon at 1300. He was correcting too quickly. Fluids were changed to D5 overnight at 125. He was at 134 this am. He was then placed on Dr. Stephenie Einstein.   Tobacco abuse:  Nicotine patch has been applied.  Schizophrenia: Haldol 5 mg PO or IM as per psychiatry recommendations. Pt will ben transferred to [facility] health unit after he is out of detox. The patient has been displaying visual and auditory hallucinations.     Subjective: Pt is sitting up at bed. He is extremely agitated. His speech is very pressured and largely unintelligible. I am able to comprehend that he is very concerned that there is a contract out to kill him.   Physical Exam: Vitals:   07/02/23 0400  07/02/23 0404 07/02/23 0500 07/02/23 0600  BP: 117/76 112/74 106/78 125/85  Pulse: (!) 56 76 61   Resp: 18  (!) 22 (!) 23  Temp:      TempSrc:      SpO2: 93%  94%   Weight:      Height:       Exam:  Constitutional:  The patient is sleeping soundly. Not awakened. Head             Temporal Wasting Respiratory:  The patient is tachypneic. No wheezes, rales, or rhonchi No tactile fremitus Cardiovascular:  Rate is fast. No murmurs, ectopy, or gallups. No lateral PMI. No thrills. Abdomen:  Abdomen is soft, non-tender, non-distended Abdomen is scaffoid. No hernias, masses, or organomegaly Normoactive bowel sounds.  Musculoskeletal:  No cyanosis, clubbing, or edema Cachectic Skin:  No rashes, lesions, ulcers palpation of skin: no induration or nodules Neurologic:  Patient is unable to cooperate with exam. Psychiatric:  Patient is unable to cooperate with exam.  Data Reviewed:  Telemetry CBC BMP  Family Communication: None available  Disposition: Status is: Inpatient Remains inpatient appropriate because: Pt is in DT's and requires close monitoring and treatment or suffer organ system deterioration or death.  Planned Discharge Destination:  [facility] Unit    Time spent: 38 minutes  Author: Ramiel Forti, DO 07/02/2023 3:12 PM  For on call review [email or web address].

## 2023-07-02 NOTE — Plan of Care (Signed)
  Problem: Clinical Measurements: Goal: Respiratory complications will improve Outcome: Progressing Goal: Cardiovascular complication will be avoided Outcome: Progressing   Problem: Nutrition: Goal: Adequate nutrition will be maintained Outcome: Progressing   Problem: Elimination: Goal: Will not experience complications related to urinary retention Outcome: Progressing   Problem: Skin Integrity: Goal: Risk for impaired skin integrity will decrease Outcome: Progressing   Problem: Coping: Goal: Level of anxiety will decrease Outcome: Not Progressing   Problem: Safety: Goal: Ability to remain free from injury will improve Outcome: Not Progressing   Problem: Safety: Goal: Violent Restraint(s) Outcome: Not Progressing   Problem: Safety: Goal: Non-violent Restraint(s) Outcome: Not Progressing

## 2023-07-02 NOTE — Progress Notes (Signed)
 Peripherally Inserted Central Catheter Placement  The IV Nurse has discussed with the patient and/or persons authorized to consent for the patient, the purpose of this procedure and the potential benefits and risks involved with this procedure.  The benefits include less needle sticks, lab draws from the catheter, and the patient may be discharged home with the catheter. Risks include, but not limited to, infection, bleeding, blood clot (thrombus formation), and puncture of an artery; nerve damage and irregular heartbeat and possibility to perform a PICC exchange if needed/ordered by physician.  Alternatives to this procedure were also discussed.  Bard Power PICC patient education guide, fact sheet on infection prevention and patient information card has been provided to patient /or left at bedside. Medically necessary consent per MD.   PICC Placement Documentation  PICC Double Lumen 07/02/23 Right Brachial 41 cm 0 cm (Active)  Indication for Insertion or Continuance of Line Limited venous access - need for IV therapy >5 days (PICC only) 07/02/23 1345  Exposed Catheter (cm) 0 cm 07/02/23 1345  Site Assessment Clean, Dry, Intact 07/02/23 1345  Lumen #1 Status Flushed;Saline locked;Blood return noted 07/02/23 1345  Lumen #2 Status Flushed;Saline locked;Blood return noted 07/02/23 1345  Dressing Type Transparent;Securing device 07/02/23 1345  Dressing Status Antimicrobial disc/dressing in place;Clean, Dry, Intact 07/02/23 1345  Line Care Connections checked and tightened 07/02/23 1345  Line Adjustment (NICU/IV Team Only) No 07/02/23 1345  Dressing Intervention New dressing;Adhesive placed at insertion site (IV team only) 07/02/23 1345  Dressing Change Due 07/09/23 07/02/23 1345       Sharell Hilmer Haywood Lisle 07/02/2023, 1:48 PM

## 2023-07-03 DIAGNOSIS — F10931 Alcohol use, unspecified with withdrawal delirium: Secondary | ICD-10-CM | POA: Diagnosis not present

## 2023-07-03 DIAGNOSIS — F32A Depression, unspecified: Secondary | ICD-10-CM

## 2023-07-03 DIAGNOSIS — E43 Unspecified severe protein-calorie malnutrition: Secondary | ICD-10-CM | POA: Insufficient documentation

## 2023-07-03 DIAGNOSIS — Z720 Tobacco use: Secondary | ICD-10-CM

## 2023-07-03 DIAGNOSIS — F1910 Other psychoactive substance abuse, uncomplicated: Secondary | ICD-10-CM

## 2023-07-03 DIAGNOSIS — F209 Schizophrenia, unspecified: Secondary | ICD-10-CM | POA: Diagnosis not present

## 2023-07-03 DIAGNOSIS — L03113 Cellulitis of right upper limb: Secondary | ICD-10-CM

## 2023-07-03 DIAGNOSIS — E46 Unspecified protein-calorie malnutrition: Secondary | ICD-10-CM | POA: Diagnosis not present

## 2023-07-03 DIAGNOSIS — I959 Hypotension, unspecified: Secondary | ICD-10-CM | POA: Diagnosis not present

## 2023-07-03 DIAGNOSIS — E871 Hypo-osmolality and hyponatremia: Secondary | ICD-10-CM

## 2023-07-03 LAB — BASIC METABOLIC PANEL WITH GFR
Anion gap: 9 (ref 5–15)
BUN: 7 mg/dL (ref 6–20)
CO2: 23 mmol/L (ref 22–32)
Calcium: 8.5 mg/dL — ABNORMAL LOW (ref 8.9–10.3)
Chloride: 102 mmol/L (ref 98–111)
Creatinine, Ser: 0.72 mg/dL (ref 0.61–1.24)
GFR, Estimated: >60 mL/min (ref >60)
Glucose, Bld: 592 mg/dL (ref 70–99)
Potassium: 3.7 mmol/L (ref 3.5–5.1)
Sodium: 134 mmol/L — ABNORMAL LOW (ref 135–145)

## 2023-07-03 LAB — RAPID URINE DRUG SCREEN, HOSP PERFORMED
Amphetamines: POSITIVE — AB
Barbiturates: NOT DETECTED
Benzodiazepines: POSITIVE — AB
Cocaine: NOT DETECTED
Opiates: NOT DETECTED
Tetrahydrocannabinol: NOT DETECTED

## 2023-07-03 LAB — CBC WITH DIFFERENTIAL/PLATELET
Abs Immature Granulocytes: 0.03 10*3/uL (ref 0.00–0.07)
Basophils Absolute: 0.1 10*3/uL (ref 0.0–0.1)
Basophils Relative: 1 %
Eosinophils Absolute: 0.3 10*3/uL (ref 0.0–0.5)
Eosinophils Relative: 4 %
HCT: 36.2 % — ABNORMAL LOW (ref 39.0–52.0)
Hemoglobin: 11.9 g/dL — ABNORMAL LOW (ref 13.0–17.0)
Immature Granulocytes: 0 %
Lymphocytes Relative: 29 %
Lymphs Abs: 2 10*3/uL (ref 0.7–4.0)
MCH: 33.1 pg (ref 26.0–34.0)
MCHC: 32.9 g/dL (ref 30.0–36.0)
MCV: 100.6 fL — ABNORMAL HIGH (ref 80.0–100.0)
Monocytes Absolute: 1.3 10*3/uL — ABNORMAL HIGH (ref 0.1–1.0)
Monocytes Relative: 19 %
Neutro Abs: 3.2 10*3/uL (ref 1.7–7.7)
Neutrophils Relative %: 47 %
Platelets: 357 10*3/uL (ref 150–400)
RBC: 3.6 MIL/uL — ABNORMAL LOW (ref 4.22–5.81)
RDW: 12.9 % (ref 11.5–15.5)
WBC: 6.9 10*3/uL (ref 4.0–10.5)
nRBC: 0 % (ref 0.0–0.2)

## 2023-07-03 LAB — GLUCOSE, CAPILLARY: Glucose-Capillary: 155 mg/dL — ABNORMAL HIGH (ref 70–99)

## 2023-07-03 MED ORDER — ENSURE ENLIVE PO LIQD
237.0000 mL | Freq: Two times a day (BID) | ORAL | Status: DC
  Administered 2023-07-03 – 2023-07-05 (×5): 237 mL via ORAL

## 2023-07-03 MED ORDER — RISPERIDONE 1 MG PO TABS
0.5000 mg | ORAL_TABLET | Freq: Two times a day (BID) | ORAL | Status: DC
  Administered 2023-07-03 – 2023-07-05 (×5): 0.5 mg via ORAL
  Filled 2023-07-03 (×5): qty 1

## 2023-07-03 MED ORDER — DEXTROSE IN LACTATED RINGERS 5 % IV SOLN: INTRAVENOUS | Status: DC

## 2023-07-03 MED ORDER — CEPHALEXIN 500 MG PO CAPS
500.0000 mg | ORAL_CAPSULE | Freq: Four times a day (QID) | ORAL | Status: DC
  Administered 2023-07-03 – 2023-07-05 (×12): 500 mg via ORAL
  Filled 2023-07-03 (×13): qty 1

## 2023-07-03 MED ORDER — MUPIROCIN 2 % EX OINT
1.0000 | TOPICAL_OINTMENT | Freq: Two times a day (BID) | CUTANEOUS | Status: DC
  Administered 2023-07-03 – 2023-07-05 (×6): 1 via NASAL
  Filled 2023-07-03: qty 22

## 2023-07-03 MED ORDER — CHLORHEXIDINE GLUCONATE CLOTH 2 % EX PADS
6.0000 | MEDICATED_PAD | Freq: Every day | CUTANEOUS | Status: DC
  Administered 2023-07-03 – 2023-07-05 (×3): 6 via TOPICAL

## 2023-07-03 MED ORDER — SODIUM CHLORIDE 0.9 % IV SOLN
260.0000 mg | Freq: Once | INTRAVENOUS | Status: AC
  Administered 2023-07-03: 260 mg via INTRAVENOUS
  Filled 2023-07-03: qty 2

## 2023-07-03 MED ORDER — ADULT MULTIVITAMIN W/MINERALS CH
1.0000 | ORAL_TABLET | Freq: Every day | ORAL | Status: DC
  Administered 2023-07-03 – 2023-07-05 (×3): 1 via ORAL
  Filled 2023-07-03 (×3): qty 1

## 2023-07-03 NOTE — Consult Note (Signed)
 NAME:  Jerry [name], MRN:  [id number], DOB:  1975/10/08, LOS: 3 ADMISSION DATE:  06/30/2023, CONSULTATION DATE:  07/03/23 REFERRING MD:  Dr Gerri Lins, CHIEF COMPLAINT: Alcohol withdrawal  History of Present Illness:  48 year old gentleman who was admitted, brought in by police department from a gas station from what appeared to be a psychotic episode History of significant alcohol use, last drink was 2 days ago Has had a history of alcoholic withdrawal seizures, delirium in the past  Past history of alcohol abuse, schizophrenia, polysubstance abuse, generalized anxiety, PTSD, tardive dyskinesia  Pertinent  Medical History   Past Medical History:  Diagnosis Date   Anxiety    Bipolar 1 disorder (HCC)    Depression    Drug-induced seizure (HCC)    EtOH dependence (HCC)    Hallucination    Paranoid schizophrenia (HCC)    since pt's 20's   PTSD (post-traumatic stress disorder)    Significant Hospital Events: Including procedures, antibiotic start and stop dates in addition to other pertinent events   Admitted to 4/12 4/15 CCM consult, on Precedex  Interim History / Subjective:  Appears calm, redirectable, denies pain or discomfort  Objective   Blood pressure 124/78, pulse (!) 57, temperature 97.9 F (36.6 C), temperature source Oral, resp. rate (!) 26, height 5\' 9"  (1.753 m), weight 72.6 kg, SpO2 95%.        Intake/Output Summary (Last 24 hours) at 07/03/2023 0958 Last data filed at 07/03/2023 0940 Gross per 24 hour  Intake 4936.94 ml  Output 2950 ml  Net 1986.94 ml   Filed Weights   07/01/23 0943  Weight: 72.6 kg    Examination: General: Middle-age, does not appear to be in distress HENT: Dry oral mucosa Lungs: Clear breath sounds Cardiovascular: S1-S2 appreciated, not tachycardic Abdomen: Soft, bowel sounds appreciated Extremities: No edema, no clubbing Neuro: Alert and interactive GU: Thad Ranger output  I reviewed nursing notes, Consultant notes, hospitalist notes,  last 24 h vitals and pain scores, last 48 h intake and output, last 24 h labs and trends, and last 24 h imaging results.  Resolved Hospital Problem list     Assessment & Plan:  Alcohol withdrawal History of significant consumption he drinks 2/5 a day and daily -Currently on Precedex - CIWA protocol - Multivitamins, thiamine. - Will give one dose of phenobarb 260 mg  History of schizophrenia - Is IVC'd at the present time with plans to admit to behavioral therapy once stabilized -On Haldol - Hypotension seems to be improving - Continue to encourage oral intake - IV fluids as needed  Protein calorie malnutrition - Temporal wasting, cachexia - Appreciate dietary involvement  Left arm cellulitis - Switch to Keflex  Hyponatremia - Likely due to his alcoholism - Corrected  Tobacco abuse - Continue patch  Continue other lines of care present  Best Practice (right click and "Reselect all SmartList Selections" daily)   Diet/type: Regular consistency (see orders) DVT prophylaxis SCD Pressure ulcer(s): N/A GI prophylaxis: N/A Lines: N/A Foley:  N/A Code Status:  full code Last date of multidisciplinary goals of care discussion [per primary]  Labs   CBC: Recent Labs  Lab 06/30/23 2052 07/01/23 0634 07/02/23 0427 07/03/23 0634  WBC 11.7* 8.4 8.0 6.9  NEUTROABS  --   --  3.8 3.2  HGB 13.8 13.0 15.3 11.9*  HCT 39.8 39.1 44.9 36.2*  MCV 97.5 98.7 98.7 100.6*  PLT 397 381 305 357    Basic Metabolic Panel: Recent Labs  Lab 06/30/23 2347  07/01/23 0356 07/01/23 2956 07/01/23 1317 07/01/23 1637 07/01/23 2150 07/02/23 0524 07/02/23 1124 07/03/23 0509  NA  --    < > 131*   < > 135 134* 134* 135 134*  K  --    < > 3.3*   < > 3.7 3.8 4.5 4.5 3.7  CL  --    < > 100   < > 102 103 105 104 102  CO2  --    < > 25   < > 23 21* 21* 23 23  GLUCOSE  --    < > 92   < > 101* 130* 139* 122* 592*  BUN  --    < > 13   < > 10 7 6  5* 7  CREATININE  --    < > 0.80   < > 0.49*  0.54* 0.76 0.64 0.72  CALCIUM  --    < > 8.6*   < > 9.0 8.5* 8.9 8.9 8.5*  MG 2.1  --  2.3  --   --   --   --   --   --   PHOS 3.1  --  3.6  --   --   --   --   --   --    < > = values in this interval not displayed.   GFR: Estimated Creatinine Clearance: 114.2 mL/min (by C-G formula based on SCr of 0.72 mg/dL). Recent Labs  Lab 06/30/23 2052 07/01/23 0634 07/02/23 0427 07/03/23 0634  WBC 11.7* 8.4 8.0 6.9    Liver Function Tests: Recent Labs  Lab 06/30/23 2052 07/01/23 0634  AST 73* 64*  ALT 87* 73*  ALKPHOS 66 58  BILITOT 1.3* 1.1  PROT 9.0* 7.8  ALBUMIN 3.5 2.9*   No results for input(s): "LIPASE", "AMYLASE" in the last 168 hours. Recent Labs  Lab 06/30/23 2347  AMMONIA 11    ABG    Component Value Date/Time   TCO2 25 07/16/2021 1153     Coagulation Profile: Recent Labs  Lab 06/30/23 2347  INR 1.1    Cardiac Enzymes: Recent Labs  Lab 06/30/23 2347  CKTOTAL 191    HbA1C: Hgb A1c MFr Bld  Date/Time Value Ref Range Status  07/09/2022 02:59 PM 5.3 4.8 - 5.6 % Final    Comment:    (NOTE) Pre diabetes:          5.7%-6.4%  Diabetes:              >6.4%  Glycemic control for   <7.0% adults with diabetes   10/31/2020 06:21 PM 5.5 4.8 - 5.6 % Final    Comment:    (NOTE) Pre diabetes:          5.7%-6.4%  Diabetes:              >6.4%  Glycemic control for   <7.0% adults with diabetes     CBG: Recent Labs  Lab 07/02/23 0354 07/03/23 0541  GLUCAP 131* 155*    Review of Systems:   Yeah  Past Medical History:  He,  has a past medical history of Anxiety, Bipolar 1 disorder (HCC), Depression, Drug-induced seizure (HCC), EtOH dependence (HCC), Hallucination, Paranoid schizophrenia (HCC), and PTSD (post-traumatic stress disorder).   Surgical History:   Past Surgical History:  Procedure Laterality Date   KIDNEY DONATION Right 2003     Social History:   reports that he has been smoking cigarettes. He has never used smokeless tobacco.  He reports  that he does not currently use alcohol. He reports that he does not currently use drugs after having used the following drugs: Cocaine, Heroin, Amphetamines, Methamphetamines, and Marijuana.   Family History:  His family history includes Cancer in his mother; Diabetes in his father and maternal grandmother; Heart disease in his father and mother; Lung cancer in his mother; Stroke in his paternal grandfather.   Allergies Allergies  Allergen Reactions   Penicillins Other (See Comments) and Swelling    Reaction occurred in childhood     Home Medications  Prior to Admission medications   Not on File    The patient is critically ill with multiple organ systems failure and requires high complexity decision making for assessment and support, frequent evaluation and titration of therapies, application of advanced monitoring technologies and extensive interpretation of multiple databases. Critical Care Time devoted to patient care services described in this note independent of APP/resident time (if applicable)  is 32 minutes.   Myer Artis MD [facility] Personal pager: See Amion If unanswered, please page CCM On-call: #[phone]

## 2023-07-03 NOTE — Progress Notes (Signed)
 Progress Note   Patient: Jerry [name] NFA:[id number] DOB: 30-Jan-1976 DOA: 06/30/2023     3 DOS: the patient was seen and examined on 07/03/2023   Brief hospital course: The patient is a 48 yr old man who was brought to [facility] ED by PD from a gas station due to what appeared to be a psychotic episode. The patient was displaying evidence of visual and auditory hallucinations. The voices are paranoid and are telling him that there is someone who has been paid 7500 dollars to kill him.   The patient has a past medical history significant for alcohol abuse and schizophrenia. He also has a history of DT's and polysubstance abuse, GAD, tobacco abuse, malingering, PTSD, and tardive dyskinesia.  He was found to be in DT's in the ED with severe tremors. This improved with Ativan.  His last drink was 3 days ago.   He was admitted to the ICU on CIWA protocol. Since admission the patient has been severely agitated frequently scoring in the 20's for CIWA. He has been oppositional, combative and threatening. He had received 8 mg of ativan by 11:00 am this morning with no effect. The patient has been placed on a precedex drip.  The patient has been evaluated by psychiatry who has recommended admission to [facility] once he has completed Detox. In the meantime the have recommended neurontin 400 mg bid, Haldol 5.0 mg IM or PO q 8 hours.   He was given a 500 cc bolus of LR on the evening of 07/01/2023 for hypotension. After this bolus he was given D5 overnight to slow the correction of his hyponatremia.   Na on the morning of 07/02/2023 was 134 which was appropriate. The patient was sedated on precedex gtt.   Registered dietician was consulted to evaluate the patient for nutritional needs.   The patient is calm and pleasant this morning. He remains on precedex drip.  Critical care was consulted in compliance with ICU policy to consult PCCM after precedex drip has been in place after 24 hours.  They have given the patient 260 mg IV phenobarbital and consulted psychiatry. I greatly appreciate their help.   Assessment and Plan:  Alcohol Withdrawal:  Acute and severe requiring physical restraints as well as precedex drip. CIWA protocol is ordered. The patient is having active paranoid hallucinations visual and auditory. He has been verbally and physically threatening and abusive to staff. He is kicking and thrashing about in bed. The patient's management had to be escalated from CIWA protocol with ativan to Precedex drip with loading dose. He is also receiving Thiamine and Folate. The patient has been given 260 mg phenobarbital  On the morning of 07/02/2023 the patient was sedated on the precedex drip. PICC was placed this afternoon. The patient had a CIWA of 26 when he was off of precedex for a short time. PCCM has been consulted as the patient is almost at 24 hours on the precedex drip and is not close to being weaned off.   Hypotension:  Resolved. Likely due to poor fluid intake since admission and effect of ativan and precedex. The patient was given a 1L bolus of LR on the evening of 07/01/2023. Monitor blood pressure, creatinine, electrolytes, and volume status.  Severe Protein Calorie Malnutrition:  Pt with cachexia and temporal wasting. Prealbumin less than 10. Dietician consult. Continue IV fluids with D5. Monitor electrolytes. RD consulted to assist. The patient is awake enough to eat this morning.  Left arm Cellulitis:  The patient is receiving Cefepime 2 gm IV q 8 hours.. Blood culture x 2 have been drawn. The patient has screened positive for MRSA, but this cellulitis does no appear purulent.  Hyponatremia: Likely due to alcohol. Na has increased from 124 upon admission at 2000 to 133 this afternoon at 1300. He was correcting too quickly. Fluids were changed to D5 overnight at 125. He was at 134 this am. He was then placed on Jerry [name].   Tobacco abuse:  Nicotine patch has been  applied.  Schizophrenia: Haldol 5 mg PO or IM as per psychiatry recommendations. Pt will ben transferred to [facility] health unit after he is out of detox. The patient has been displaying visual and auditory hallucinations. Psychiatry was consulted. They have evaluated the patient and have determined that he does not meet criteria for inpatient psychiatric treatment or involuntary commitment. They have started the patient on Risperdal to decrease the amount of precedex that the patient will need.      Subjective: Pt is lying in bed sleeping. He is easily rousable. He is pleasant and cooperative with exam.   Physical Exam: Vitals:   07/03/23 1200 07/03/23 1214 07/03/23 1400 07/03/23 1500  BP: (!) 132/90  (!) 118/97 107/77  Pulse: 71  73 73  Resp: (!) 24  17 (!) 28  Temp:  98.1 F (36.7 C)    TempSrc:  Oral    SpO2: 95%  99% 97%  Weight:      Height:       Exam:  Constitutional:  The patient is somnolent, but rousable. No new complaints.  Head             Temporal Wasting Respiratory:  The patient is tachypneic. No wheezes, rales, or rhonchi No tactile fremitus Cardiovascular:  Rate is fast. No murmurs, ectopy, or gallups. No lateral PMI. No thrills. Abdomen:  Abdomen is soft, non-tender, non-distended Abdomen is scaffoid. No hernias, masses, or organomegaly Normoactive bowel sounds.  Musculoskeletal:  No cyanosis, clubbing, or edema Cachectic Skin:  No rashes, lesions, ulcers palpation of skin: no induration or nodules Neurologic:  Patient is unable to cooperate with exam. Psychiatric:  Patient is unable to cooperate with exam.  Data Reviewed:  Telemetry CBC BMP  Family Communication: None available  Disposition: Status is: Inpatient Remains inpatient appropriate because: Pt is in DT's and requires close monitoring and treatment or suffer organ system deterioration or death.  Planned Discharge Destination:  [facility]  Unit    Time spent: 386 minutes  Author: Ngan Qualls, DO 07/03/2023 4:10 PM  For on call review [email or web address].

## 2023-07-03 NOTE — Progress Notes (Signed)
 Initial Nutrition Assessment  DOCUMENTATION CODES:   Severe malnutrition in context of chronic illness  INTERVENTION:  - Liberalize to a Regular diet.  - Ensure Plus High Protein po BID, each supplement provides 350 kcal and 20 grams of protein. - Encourgae intake at all meals and of supplements.  - Continue Multivitamin with minerals and thiamine daily.  - Monitor weight trends.   NUTRITION DIAGNOSIS:   Severe Malnutrition related to chronic illness (alcohol abuse) as evidenced by moderate fat depletion, severe muscle depletion, percent weight loss (26.6% in 7.5 months).  GOAL:   Patient will meet greater than or equal to 90% of their needs  MONITOR:   PO intake, Supplement acceptance, Labs, Weight trends  REASON FOR ASSESSMENT:   Consult Assessment of nutrition requirement/status  ASSESSMENT:   48 yr old man with PMH significant for alcohol abuse and schizophrenia, polysubstance abuse, GAD, tobacco abuse, malingering, PTSD who was brought in by PD from a gas station due to what appeared to be a psychotic episode. The patient was displaying evidence of visual and auditory hallucinations and paranoid that there is someone trying to kill him. Admitted for alcohol withdrawal with DT's.   Patient noted to have previously been very agitated and aggressive with staff. Per discussion with RN this AM, patient more calm and alert/oriented.   Met with patient at bedside. He reports a UBW of 196# and feels that he has lost a lot of weight over the past few months due to "drugs".  Per EMR, weight appears to have trended up from 179# to 218# from April to September last year. However, since that time weight has been trending down. Office and admission weights below indicate significant weight loss of 58# or 26.6% in 7.5 months, which is significant for the time frame.  9/11: 218# 11/9: 201# 1/17: 175# 3/12: 167# 4/13: 160#  Patient shares that despite this loss he was eating normally  recently with normal appetite. However, typically only eats 1 meal a day.   Patient previously not eating well earlier this admission due to agitation and sedation. However, RN today reports he did well with breakfast and patient documented to have had 100%. Patient endorses liking Ensure and agreeable to receive during admission. Encouraged intake of 3 meals a day in addition to Ensure.     Medications reviewed and include: 100mg  thiamine, MVI Precedex D5 @ 131mL/hr (provides 510 kcals over 24 hours)  Labs reviewed:  Na 134   NUTRITION - FOCUSED PHYSICAL EXAM:  Flowsheet Row Most Recent Value  Orbital Region Moderate depletion  Upper Arm Region Mild depletion  Thoracic and Lumbar Region No depletion  Buccal Region Moderate depletion  Temple Region Severe depletion  Clavicle Bone Region Severe depletion  Clavicle and Acromion Bone Region Moderate depletion  Scapular Bone Region Unable to assess  Dorsal Hand Unable to assess  Patellar Region Mild depletion  Anterior Thigh Region Mild depletion  Posterior Calf Region No depletion  Edema (RD Assessment) None  Hair Reviewed  Eyes Reviewed  Mouth Reviewed  [edentulous]  Skin Reviewed  Nails Reviewed       Diet Order:   Diet Order             Diet regular Room service appropriate? Yes; Fluid consistency: Thin  Diet effective now                   EDUCATION NEEDS:  Education needs have been addressed  Skin:  Skin Assessment: Reviewed RN Assessment  Last BM:  4/11  Height:  Ht Readings from Last 1 Encounters:  07/01/23 5\' 9"  (1.753 m)   Weight:  Wt Readings from Last 1 Encounters:  07/01/23 72.6 kg    BMI:  Body mass index is 23.64 kg/m.  Estimated Nutritional Needs:  Kcal:  2050-2200 kcals Protein:  100-115 grams Fluid:  >/= 2L    Scheryl Cushing RD, LDN Contact via Secure Chat.

## 2023-07-03 NOTE — Plan of Care (Signed)
  Problem: Activity: Goal: Risk for activity intolerance will decrease Outcome: Not Progressing   Problem: Coping: Goal: Level of anxiety will decrease Outcome: Not Progressing   Problem: Safety: Goal: Ability to remain free from injury will improve Outcome: Not Progressing   Problem: Safety: Goal: Violent Restraint(s) Outcome: Not Progressing
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
## 2023-07-04 DIAGNOSIS — F1910 Other psychoactive substance abuse, uncomplicated: Secondary | ICD-10-CM | POA: Diagnosis not present

## 2023-07-04 DIAGNOSIS — F10932 Alcohol use, unspecified with withdrawal with perceptual disturbance: Secondary | ICD-10-CM

## 2023-07-04 DIAGNOSIS — F10931 Alcohol use, unspecified with withdrawal delirium: Secondary | ICD-10-CM | POA: Diagnosis not present

## 2023-07-04 DIAGNOSIS — I959 Hypotension, unspecified: Secondary | ICD-10-CM | POA: Diagnosis not present

## 2023-07-04 DIAGNOSIS — E46 Unspecified protein-calorie malnutrition: Secondary | ICD-10-CM | POA: Diagnosis not present

## 2023-07-04 LAB — CBC WITH DIFFERENTIAL/PLATELET
Abs Immature Granulocytes: 0.04 10*3/uL (ref 0.00–0.07)
Basophils Absolute: 0.1 10*3/uL (ref 0.0–0.1)
Basophils Relative: 1 %
Eosinophils Absolute: 0.4 10*3/uL (ref 0.0–0.5)
Eosinophils Relative: 5 %
HCT: 35.1 % — ABNORMAL LOW (ref 39.0–52.0)
Hemoglobin: 11.7 g/dL — ABNORMAL LOW (ref 13.0–17.0)
Immature Granulocytes: 1 %
Lymphocytes Relative: 33 %
Lymphs Abs: 2.4 10*3/uL (ref 0.7–4.0)
MCH: 33.1 pg (ref 26.0–34.0)
MCHC: 33.3 g/dL (ref 30.0–36.0)
MCV: 99.4 fL (ref 80.0–100.0)
Monocytes Absolute: 1.2 10*3/uL — ABNORMAL HIGH (ref 0.1–1.0)
Monocytes Relative: 17 %
Neutro Abs: 3.1 10*3/uL (ref 1.7–7.7)
Neutrophils Relative %: 43 %
Platelets: 360 10*3/uL (ref 150–400)
RBC: 3.53 MIL/uL — ABNORMAL LOW (ref 4.22–5.81)
RDW: 12.8 % (ref 11.5–15.5)
WBC: 7.1 10*3/uL (ref 4.0–10.5)
nRBC: 0 % (ref 0.0–0.2)

## 2023-07-04 LAB — COMPREHENSIVE METABOLIC PANEL WITH GFR
ALT: 50 U/L — ABNORMAL HIGH (ref 0–44)
AST: 39 U/L (ref 15–41)
Albumin: 2.4 g/dL — ABNORMAL LOW (ref 3.5–5.0)
Alkaline Phosphatase: 48 U/L (ref 38–126)
Anion gap: 8 (ref 5–15)
BUN: 9 mg/dL (ref 6–20)
CO2: 28 mmol/L (ref 22–32)
Calcium: 8.8 mg/dL — ABNORMAL LOW (ref 8.9–10.3)
Chloride: 100 mmol/L (ref 98–111)
Creatinine, Ser: 0.58 mg/dL — ABNORMAL LOW (ref 0.61–1.24)
GFR, Estimated: >60 mL/min (ref >60)
Glucose, Bld: 103 mg/dL — ABNORMAL HIGH (ref 70–99)
Potassium: 3.4 mmol/L — ABNORMAL LOW (ref 3.5–5.1)
Sodium: 136 mmol/L (ref 135–145)
Total Bilirubin: 0.4 mg/dL (ref 0.0–1.2)
Total Protein: 7.1 g/dL (ref 6.5–8.1)

## 2023-07-04 LAB — VITAMIN B1: Vitamin B1 (Thiamine): 184.4 nmol/L (ref 66.5–200.0)

## 2023-07-04 MED ORDER — POTASSIUM CHLORIDE 20 MEQ PO PACK
40.0000 meq | PACK | Freq: Once | ORAL | Status: AC
  Administered 2023-07-04: 40 meq via ORAL
  Filled 2023-07-04: qty 2

## 2023-07-04 NOTE — TOC Initial Note (Signed)
 Transition of Care [facility]) - Initial/Assessment Note    Patient Details  Name: Jerry [name] MRN: [id number] Date of Birth: 1975/07/28  Transition of Care [facility]) CM/SW Contact:    Lanier Clam, RN Phone Number: 07/04/2023, 3:57 PM  Clinical Narrative: Provided resources for Substance use. Patient has health insurance-does not meet for [facility] program. Pharmacy to asst w/reasonable meds affordable or discount coupons.                   Expected Discharge Plan: Home/Self Care Barriers to Discharge: Continued Medical Work up   Patient Goals and CMS Choice Patient states their goals for this hospitalization and ongoing recovery are:: IP Psych          Expected Discharge Plan and Services                                              Prior Living Arrangements/Services                       Activities of Daily Living   ADL Screening (condition at time of admission) Independently performs ADLs?: Yes (appropriate for developmental age) Is the patient deaf or have difficulty hearing?: No Does the patient have difficulty seeing, even when wearing glasses/contacts?: No Does the patient have difficulty concentrating, remembering, or making decisions?: No  Permission Sought/Granted                  Emotional Assessment              Admission diagnosis:  Alcohol withdrawal (HCC) [F10.939] Hallucinations [R44.3] Alcohol withdrawal syndrome, with delirium (HCC) [F10.931] Psychosis, unspecified psychosis type (HCC) [F29] Patient Active Problem List   Diagnosis Date Noted   Protein-calorie malnutrition, severe 07/03/2023   Left arm cellulitis 07/01/2023   Jaw pain 11/29/2022   Need for influenza vaccination 11/29/2022   Erectile dysfunction 11/29/2022   Hyponatremia 11/29/2022   History of hepatitis C virus infection 11/29/2022   Tardive dyskinesia 10/24/2022   Generalized anxiety disorder 10/24/2022   PTSD (post-traumatic stress disorder)  10/24/2022   Healthcare maintenance 10/24/2022   Alcohol withdrawal (HCC) 07/22/2022   Schizoaffective disorder, bipolar type (HCC) 07/09/2022   Schizophrenia (HCC) 07/07/2022   Auditory hallucination    Malingering 09/07/2020   Homelessness 09/07/2020   Substance induced mood disorder (HCC) 09/07/2020   Major depressive disorder, recurrent severe without psychotic features (HCC) 07/17/2020   Schizophrenia spectrum disorder with psychotic disorder type not yet determined (HCC) 07/17/2020   Methamphetamine abuse (HCC) 07/17/2020   Marijuana abuse 07/17/2020   Cocaine abuse (HCC) 07/17/2020   Anxiety and depression 06/25/2014   Family history of diabetes mellitus (DM) 06/25/2014   Family history of thyroid disease 06/25/2014   Tobacco use disorder 06/25/2014   Alcohol abuse 05/18/2013   Polysubstance abuse (HCC) 05/18/2013   Depression, unspecified 05/18/2013   PCP:  Donell Beers, FNP Pharmacy:   [facility] [address] [place] [address] Phone: [phone] Fax: [phone]  [id number] - [place], [place] - [address] [place] [address] Phone: [phone] Fax: [phone]     Social Drivers of Health (SDOH) Social History: SDOH Screenings   Food Insecurity: No Food Insecurity (07/03/2023)  Recent Concern: Food Insecurity - Food Insecurity Present (07/01/2023)  Housing: High Risk (07/03/2023)  Transportation Needs: No Transportation Needs (07/03/2023)  Recent Concern: Transportation Needs - Unmet Transportation Needs (07/01/2023)  Utilities: Not At Risk (07/01/2023)  Alcohol Screen: Medium Risk (10/30/2020)  Depression (PHQ2-9): High Risk (11/29/2022)  Social Connections: Socially Isolated (07/01/2023)  Tobacco Use: High Risk (06/30/2023)   SDOH Interventions: Food Insecurity Interventions: Intervention Not Indicated, Inpatient TOC Housing  Interventions: Intervention Not Indicated, Inpatient TOC Transportation Interventions: Intervention Not Indicated, Inpatient TOC   Readmission Risk Interventions    07/03/2023   10:46 AM  Readmission Risk Prevention Plan  PCP or Specialist Appt within 3-5 Days Complete  HRI or Home Care Consult Complete  Social Work Consult for Recovery Care Planning/Counseling Complete  Palliative Care Screening Complete

## 2023-07-04 NOTE — Plan of Care (Signed)
  Problem: Clinical Measurements: Goal: Diagnostic test results will improve Outcome: Progressing Goal: Respiratory complications will improve Outcome: Progressing Goal: Cardiovascular complication will be avoided Outcome: Progressing   Problem: Activity: Goal: Risk for activity intolerance will decrease Outcome: Progressing   Problem: Nutrition: Goal: Adequate nutrition will be maintained Outcome: Progressing   Problem: Coping: Goal: Level of anxiety will decrease Outcome: Progressing   

## 2023-07-04 NOTE — Progress Notes (Signed)
 Progress Note   Patient: Jerry [name] FAO:[id number] DOB: 1975/04/11 DOA: 06/30/2023     4 DOS: the patient was seen and examined on 07/04/2023   Brief hospital course: The patient is a 48 yr old man who was brought to [facility] ED by PD from a gas station due to what appeared to be a psychotic episode. The patient was displaying evidence of visual and auditory hallucinations. The voices are paranoid and are telling him that there is someone who has been paid 7500 dollars to kill him.   The patient has a past medical history significant for alcohol abuse and schizophrenia. He also has a history of DT's and polysubstance abuse, GAD, tobacco abuse, malingering, PTSD, and tardive dyskinesia.  He was found to be in DT's in the ED with severe tremors. This improved with Ativan.  His last drink was 3 days ago.   He was admitted to the ICU on CIWA protocol. Since admission the patient has been severely agitated frequently scoring in the 20's for CIWA. He has been oppositional, combative and threatening. He had received 8 mg of ativan by 11:00 am this morning with no effect. The patient has been placed on a precedex drip.  The patient has been evaluated by psychiatry who has recommended admission to [facility] once he has completed Detox. In the meantime the have recommended neurontin 400 mg bid, Haldol 5.0 mg IM or PO q 8 hours.   He was given a 500 cc bolus of LR on the evening of 07/01/2023 for hypotension. After this bolus he was given D5 overnight to slow the correction of his hyponatremia.   Na on the morning of 07/02/2023 was 134 which was appropriate. The patient was sedated on precedex gtt.   Registered dietician was consulted to evaluate the patient for nutritional needs.   The patient is calm and pleasant this morning. He remains on precedex drip.  Critical care was consulted in compliance with ICU policy to consult PCCM after precedex drip has been in place after 24 hours.  They have given the patient 260 mg IV phenobarbital and consulted psychiatry. I greatly appreciate their help.   On 07/04/2023 the patient has been weaned off of precedex. He is interactive and pleasant. He states that he is still having visual hallucinations.  Assessment and Plan:  Alcohol Withdrawal:  Acute and severe requiring physical restraints as well as precedex drip. CIWA protocol is ordered. The patient is having active paranoid hallucinations visual and auditory. He has been verbally and physically threatening and abusive to staff. He is kicking and thrashing about in bed. The patient's management had to be escalated from CIWA protocol with ativan to Precedex drip with loading dose. He is also receiving Thiamine and Folate. The patient has been given 260 mg phenobarbital, Precedex has been weaned off. The patient is awake and alert and oriented x 2. He is communicative, still tremulous, and still hallucinating.  Hypotension:  Resolved. Likely due to poor fluid intake since admission and effect of ativan and precedex. The patient was given a 1L bolus of LR on the evening of 07/01/2023. Monitor blood pressure, creatinine, electrolytes, and volume status.  Severe Protein Calorie Malnutrition:  Pt with cachexia and temporal wasting. Prealbumin less than 10. Dietician consult. Continue IV fluids with D5. Monitor electrolytes. RD consulted to assist. The patient is tolerating his regular diet.  Left arm Cellulitis:  Resolving. The patient is receiving Cefepime 2 gm IV q 8 hours.. Blood culture  x 2 have been drawn. The patient has screened positive for MRSA, but this cellulitis does no appear purulent.  Hyponatremia: Resolved. Likely due to alcohol. Na has increased from 124 upon admission at 2000 to 133 this afternoon at 1300. He was correcting too quickly. Fluids were changed to D5 overnight at 125. He was at 134 this am. He was then placed on Dr. Stephenie Einstein.   Tobacco abuse:  Nicotine patch has  been applied.  Schizophrenia: Haldol 5 mg PO or IM as per psychiatry recommendations. Pt will ben transferred to [facility] health unit after he is out of detox. The patient has been displaying visual and auditory hallucinations. Psychiatry was consulted. They have evaluated the patient and have determined that he does not meet criteria for inpatient psychiatric treatment or involuntary commitment. They have started the patient on Risperdal to decrease the amount of precedex that the patient will need.      Subjective: Pt is lying in bed sleeping. He is easily rousable. He is pleasant, interactive and cooperative.  Physical Exam: Vitals:   07/04/23 0801 07/04/23 0900 07/04/23 1200 07/04/23 1400  BP:  (!) 149/97    Pulse:  87    Resp:  20    Temp: 98.4 F (36.9 C)  98 F (36.7 C) 98 F (36.7 C)  TempSrc: Oral   Oral  SpO2:  95%    Weight:      Height:       Exam:  Constitutional:  The patient is somnolent, but rousable. No new complaints.  Head             Temporal Wasting Respiratory:  The patient is tachypneic. No wheezes, rales, or rhonchi No tactile fremitus Cardiovascular:  Rate is fast. No murmurs, ectopy, or gallups. No lateral PMI. No thrills. Abdomen:  Abdomen is soft, non-tender, non-distended Abdomen is scaffoid. No hernias, masses, or organomegaly Normoactive bowel sounds.  Musculoskeletal:  No cyanosis, clubbing, or edema Cachectic Skin:  No rashes, lesions, ulcers palpation of skin: no induration or nodules Neurologic:  Patient is unable to cooperate with exam. Psychiatric:  Patient is unable to cooperate with exam.  Data Reviewed:  Telemetry CBC BMP  Family Communication: None available  Disposition: Status is: Inpatient Remains inpatient appropriate because: Pt is in DT's and requires close monitoring and treatment or suffer organ system deterioration or death.  Planned Discharge Destination:  [facility]  Unit    Time spent: 34 minutes  Author: Raihana Balderrama, DO 07/04/2023 4:50 PM  For on call review [email or web address].

## 2023-07-04 NOTE — Progress Notes (Signed)
 NAME:  Jerry [name], MRN:  [id number], DOB:  02-12-1976, LOS: 4 ADMISSION DATE:  06/30/2023, CONSULTATION DATE: 07/03/2023 REFERRING MD: Dr. Gerri Lins, CHIEF COMPLAINT: Alcohol withdrawal  History of Present Illness:  48 year old gentleman who was admitted, brought in by police department from a gas station from what appeared to be a psychotic episode History of significant alcohol use, last drink was 2 days ago Has had a history of alcoholic withdrawal seizures, delirium in the past   Past history of alcohol abuse, schizophrenia, polysubstance abuse, generalized anxiety, PTSD, tardive dyskinesia  Pertinent  Medical History   Past Medical History:  Diagnosis Date   Anxiety    Bipolar 1 disorder (HCC)    Depression    Drug-induced seizure (HCC)    EtOH dependence (HCC)    Hallucination    Paranoid schizophrenia (HCC)    since pt's 20's   PTSD (post-traumatic stress disorder)     Significant Hospital Events: Including procedures, antibiotic start and stop dates in addition to other pertinent events   Admitted 4/12 4/15 CCM consulted, on Precedex  Interim History / Subjective:  Precedex weaned Appears calm, redirectable, denies pain or discomfort No overnight events  Objective   Blood pressure 135/88, pulse 90, temperature 98.1 F (36.7 C), temperature source Axillary, resp. rate 15, height 5\' 9"  (1.753 m), weight 72.6 kg, SpO2 95%.        Intake/Output Summary (Last 24 hours) at 07/04/2023 0741 Last data filed at 07/04/2023 4098 Gross per 24 hour  Intake 5057.22 ml  Output 6500 ml  Net -1442.78 ml   Filed Weights   07/01/23 0943  Weight: 72.6 kg    Examination: General: Middle-age, does not appear to be in distress HENT: Dry oral mucosa Lungs: Clear breath sounds Cardiovascular: S1-S2 appreciated with no murmur, not tachycardic at present Abdomen: Bowel sounds appreciated Extremities: No edema, no clubbing Neuro: Alert and interactive GU: Fair output  I  reviewed nursing notes, Consultant notes, hospitalist notes, last 24 h vitals and pain scores, last 48 h intake and output, last 24 h labs and trends, and last 24 h imaging results.  Anemia  Resolved Hospital Problem list     Assessment & Plan:  Alcohol withdrawal History of significant consumption, drinks 2 fifth a day and daily - Able to wean off Precedex - Continues on CIWA protocol - Multivitamins, thiamine - Did receive a dose of phenobarb 260 on 4/15  History of schizophrenia - IVC in place - To be admitted to behavioral therapy once stabilized - On Haldol  Protein calorie malnutrition - He has temporal wasting, cachexia - On a diet at present and tolerating well  Left arm cellulitis - On Keflex  Tobacco abuse - Continue patch  Now off Precedex  Best Practice (right click and "Reselect all SmartList Selections" daily)   Diet/type: Regular consistency (see orders) DVT prophylaxis SCD Pressure ulcer(s): N/A GI prophylaxis: N/A Lines: N/A Foley:  N/A Code Status:  full code Last date of multidisciplinary goals of care discussion [patient awake alert,]  Labs   CBC: Recent Labs  Lab 06/30/23 2052 07/01/23 0634 07/02/23 0427 07/03/23 0634 07/04/23 0349  WBC 11.7* 8.4 8.0 6.9 7.1  NEUTROABS  --   --  3.8 3.2 3.1  HGB 13.8 13.0 15.3 11.9* 11.7*  HCT 39.8 39.1 44.9 36.2* 35.1*  MCV 97.5 98.7 98.7 100.6* 99.4  PLT 397 381 305 357 360    Basic Metabolic Panel: Recent Labs  Lab 06/30/23 2347 07/01/23 0356 07/01/23  1610 07/01/23 1317 07/01/23 2150 07/02/23 0524 07/02/23 1124 07/03/23 0509 07/04/23 0349  NA  --    < > 131*   < > 134* 134* 135 134* 136  K  --    < > 3.3*   < > 3.8 4.5 4.5 3.7 3.4*  CL  --    < > 100   < > 103 105 104 102 100  CO2  --    < > 25   < > 21* 21* 23 23 28   GLUCOSE  --    < > 92   < > 130* 139* 122* 592* 103*  BUN  --    < > 13   < > 7 6 5* 7 9  CREATININE  --    < > 0.80   < > 0.54* 0.76 0.64 0.72 0.58*  CALCIUM  --     < > 8.6*   < > 8.5* 8.9 8.9 8.5* 8.8*  MG 2.1  --  2.3  --   --   --   --   --   --   PHOS 3.1  --  3.6  --   --   --   --   --   --    < > = values in this interval not displayed.   GFR: Estimated Creatinine Clearance: 114.2 mL/min (A) (by C-G formula based on SCr of 0.58 mg/dL (L)). Recent Labs  Lab 07/01/23 0634 07/02/23 0427 07/03/23 0634 07/04/23 0349  WBC 8.4 8.0 6.9 7.1    Liver Function Tests: Recent Labs  Lab 06/30/23 2052 07/01/23 0634 07/04/23 0349  AST 73* 64* 39  ALT 87* 73* 50*  ALKPHOS 66 58 48  BILITOT 1.3* 1.1 0.4  PROT 9.0* 7.8 7.1  ALBUMIN 3.5 2.9* 2.4*   No results for input(s): "LIPASE", "AMYLASE" in the last 168 hours. Recent Labs  Lab 06/30/23 2347  AMMONIA 11    ABG    Component Value Date/Time   TCO2 25 07/16/2021 1153     Coagulation Profile: Recent Labs  Lab 06/30/23 2347  INR 1.1    Cardiac Enzymes: Recent Labs  Lab 06/30/23 2347  CKTOTAL 191    HbA1C: Hgb A1c MFr Bld  Date/Time Value Ref Range Status  07/09/2022 02:59 PM 5.3 4.8 - 5.6 % Final    Comment:    (NOTE) Pre diabetes:          5.7%-6.4%  Diabetes:              >6.4%  Glycemic control for   <7.0% adults with diabetes   10/31/2020 06:21 PM 5.5 4.8 - 5.6 % Final    Comment:    (NOTE) Pre diabetes:          5.7%-6.4%  Diabetes:              >6.4%  Glycemic control for   <7.0% adults with diabetes     CBG: Recent Labs  Lab 07/02/23 0354 07/03/23 0541  GLUCAP 131* 155*    Review of Systems:   Denies pain or discomfort  Past Medical History:  He,  has a past medical history of Anxiety, Bipolar 1 disorder (HCC), Depression, Drug-induced seizure (HCC), EtOH dependence (HCC), Hallucination, Paranoid schizophrenia (HCC), and PTSD (post-traumatic stress disorder).   Surgical History:   Past Surgical History:  Procedure Laterality Date   KIDNEY DONATION Right 2003     Social History:   reports that he has been smoking  cigarettes. He has  never used smokeless tobacco. He reports that he does not currently use alcohol. He reports that he does not currently use drugs after having used the following drugs: Cocaine, Heroin, Amphetamines, Methamphetamines, and Marijuana.   Family History:  His family history includes Cancer in his mother; Diabetes in his father and maternal grandmother; Heart disease in his father and mother; Lung cancer in his mother; Stroke in his paternal grandfather.   Allergies Allergies  Allergen Reactions   Penicillins Other (See Comments) and Swelling    Reaction occurred in childhood    Myer Artis, MD [facility] Pager: See [place]ford Foley

## 2023-07-04 NOTE — Plan of Care (Signed)
   Problem: Health Behavior/Discharge Planning: Goal: Ability to manage health-related needs will improve Outcome: Progressing   Problem: Activity: Goal: Risk for activity intolerance will decrease Outcome: Progressing   Problem: Nutrition: Goal: Adequate nutrition will be maintained Outcome: Progressing   Problem: Coping: Goal: Level of anxiety will decrease Outcome: Progressing

## 2023-07-05 DIAGNOSIS — F1010 Alcohol abuse, uncomplicated: Secondary | ICD-10-CM | POA: Diagnosis not present

## 2023-07-05 DIAGNOSIS — L03113 Cellulitis of right upper limb: Secondary | ICD-10-CM | POA: Diagnosis not present

## 2023-07-05 DIAGNOSIS — Z5900 Homelessness unspecified: Secondary | ICD-10-CM

## 2023-07-05 DIAGNOSIS — F10931 Alcohol use, unspecified with withdrawal delirium: Secondary | ICD-10-CM | POA: Diagnosis not present

## 2023-07-05 DIAGNOSIS — Z720 Tobacco use: Secondary | ICD-10-CM | POA: Diagnosis not present

## 2023-07-05 DIAGNOSIS — F200 Paranoid schizophrenia: Secondary | ICD-10-CM | POA: Diagnosis not present

## 2023-07-05 DIAGNOSIS — F1910 Other psychoactive substance abuse, uncomplicated: Secondary | ICD-10-CM | POA: Diagnosis not present

## 2023-07-05 DIAGNOSIS — E46 Unspecified protein-calorie malnutrition: Secondary | ICD-10-CM | POA: Diagnosis not present

## 2023-07-05 LAB — COMPREHENSIVE METABOLIC PANEL WITH GFR
ALT: 52 U/L — ABNORMAL HIGH (ref 0–44)
AST: 42 U/L — ABNORMAL HIGH (ref 15–41)
Albumin: 2.9 g/dL — ABNORMAL LOW (ref 3.5–5.0)
Alkaline Phosphatase: 55 U/L (ref 38–126)
Anion gap: 7 (ref 5–15)
BUN: 15 mg/dL (ref 6–20)
CO2: 30 mmol/L (ref 22–32)
Calcium: 9.6 mg/dL (ref 8.9–10.3)
Chloride: 97 mmol/L — ABNORMAL LOW (ref 98–111)
Creatinine, Ser: 0.86 mg/dL (ref 0.61–1.24)
GFR, Estimated: >60 mL/min (ref >60)
Glucose, Bld: 97 mg/dL (ref 70–99)
Potassium: 3.8 mmol/L (ref 3.5–5.1)
Sodium: 134 mmol/L — ABNORMAL LOW (ref 135–145)
Total Bilirubin: 0.5 mg/dL (ref 0.0–1.2)
Total Protein: 7.9 g/dL (ref 6.5–8.1)

## 2023-07-05 LAB — CBC WITH DIFFERENTIAL/PLATELET
Abs Immature Granulocytes: 0.06 10*3/uL (ref 0.00–0.07)
Basophils Absolute: 0.1 10*3/uL (ref 0.0–0.1)
Basophils Relative: 1 %
Eosinophils Absolute: 0.4 10*3/uL (ref 0.0–0.5)
Eosinophils Relative: 4 %
HCT: 39.3 % (ref 39.0–52.0)
Hemoglobin: 12.9 g/dL — ABNORMAL LOW (ref 13.0–17.0)
Immature Granulocytes: 1 %
Lymphocytes Relative: 24 %
Lymphs Abs: 2.3 10*3/uL (ref 0.7–4.0)
MCH: 32.8 pg (ref 26.0–34.0)
MCHC: 32.8 g/dL (ref 30.0–36.0)
MCV: 100 fL (ref 80.0–100.0)
Monocytes Absolute: 1.2 10*3/uL — ABNORMAL HIGH (ref 0.1–1.0)
Monocytes Relative: 13 %
Neutro Abs: 5.4 10*3/uL (ref 1.7–7.7)
Neutrophils Relative %: 57 %
Platelets: 411 10*3/uL — ABNORMAL HIGH (ref 150–400)
RBC: 3.93 MIL/uL — ABNORMAL LOW (ref 4.22–5.81)
RDW: 13 % (ref 11.5–15.5)
WBC: 9.4 10*3/uL (ref 4.0–10.5)
nRBC: 0 % (ref 0.0–0.2)

## 2023-07-05 LAB — PROTIME-INR
INR: 1 (ref 0.8–1.2)
Prothrombin Time: 13 s (ref 11.4–15.2)

## 2023-07-05 MED ORDER — RISPERIDONE 1 MG PO TABS
1.0000 mg | ORAL_TABLET | Freq: Two times a day (BID) | ORAL | Status: DC
  Administered 2023-07-05: 1 mg via ORAL
  Filled 2023-07-05: qty 1

## 2023-07-05 NOTE — Plan of Care (Signed)
  Problem: Clinical Measurements: Goal: Respiratory complications will improve Outcome: Progressing Goal: Cardiovascular complication will be avoided Outcome: Progressing   Problem: Coping: Goal: Level of anxiety will decrease Outcome: Progressing   Problem: Elimination: Goal: Will not experience complications related to bowel motility Outcome: Progressing Goal: Will not experience complications related to urinary retention Outcome: Progressing   

## 2023-07-05 NOTE — TOC Progression Note (Signed)
 Transition of Care [facility]) - Progression Note    Patient Details  Name: Jerry [name] MRN: [id number] Date of Birth: 06-10-1975  [facility]) CM/SW Contact  Levie Ream, RN Phone Number: 07/05/2023, 2:39 PM  Clinical Narrative:    Pt not ready for d/c to IP psych facility; [facility] is following.   Expected Discharge Plan: Home/Self Care Barriers to Discharge: Continued Medical Work up  Expected Discharge Plan and Services                                               Social Determinants of Health (SDOH) Interventions SDOH Screenings   Food Insecurity: No Food Insecurity (07/03/2023)  Recent Concern: Food Insecurity - Food Insecurity Present (07/01/2023)  Housing: High Risk (07/03/2023)  Transportation Needs: No Transportation Needs (07/03/2023)  Recent Concern: Transportation Needs - Unmet Transportation Needs (07/01/2023)  Utilities: Not At Risk (07/01/2023)  Alcohol Screen: Medium Risk (10/30/2020)  Depression (PHQ2-9): High Risk (11/29/2022)  Social Connections: Socially Isolated (07/01/2023)  Tobacco Use: High Risk (06/30/2023)    Readmission Risk Interventions    07/03/2023   10:46 AM  Readmission Risk Prevention Plan  PCP or Specialist Appt within 3-5 Days Complete  HRI or Home Care Consult Complete  Social Work Consult for Recovery Care Planning/Counseling Complete  Palliative Care Screening Complete

## 2023-07-05 NOTE — Progress Notes (Signed)
 NAME:  Jerry [name], MRN:  [id number], DOB:  Mar 25, 1975, LOS: 5 ADMISSION DATE:  06/30/2023, CONSULTATION DATE: 07/03/2023 REFERRING MD: Dr. Gerri Lins, CHIEF COMPLAINT: Alcohol withdrawal  History of Present Illness:  48 year old gentleman who was admitted, brought in by police department from a gas station from what appeared to be a psychotic episode History of significant alcohol use, last drink was 2 days ago Has had a history of alcoholic withdrawal seizures, delirium in the past   Past history of alcohol abuse, schizophrenia, polysubstance abuse, generalized anxiety, PTSD, tardive dyskinesia  Pertinent  Medical History   Past Medical History:  Diagnosis Date   Anxiety    Bipolar 1 disorder (HCC)    Depression    Drug-induced seizure (HCC)    EtOH dependence (HCC)    Hallucination    Paranoid schizophrenia (HCC)    since pt's 20's   PTSD (post-traumatic stress disorder)     Significant Hospital Events: Including procedures, antibiotic start and stop dates in addition to other pertinent events   Admitted 4/12 4/15: CCM consulted, on Precedex 4/17: will sign off on patient today. Stable from ccm standpoitn  Interim History / Subjective:  Precedex weaned off yesterday without incident. Minimal symptoms this morning and vitals stable. He has minor headache, but feels improved.   Objective   Blood pressure 136/89, pulse 95, temperature 98.2 F (36.8 C), temperature source Oral, resp. rate (!) 25, height 5\' 9"  (1.753 m), weight 72.6 kg, SpO2 93%.        Intake/Output Summary (Last 24 hours) at 07/05/2023 9147 Last data filed at 07/05/2023 8295 Gross per 24 hour  Intake 700 ml  Output 4050 ml  Net -3350 ml   Filed Weights   07/01/23 0943  Weight: 72.6 kg    Examination: General: Middle-age male, sitting in bed watching tv, NAD HENT:  ncat, anicteric sclera, mmm Lungs:  resp even and unlabored, lungs CTAB, room air Cardiovascular: s1s2, rrr, no m/r/g Abdomen: soft,  flat, +BS, eating Extremities: No edema, no clubbing Neuro: Awake, alert, oriented x3  Resolved Hospital Problem list     Assessment & Plan:  Alcohol withdrawal History of significant consumption, drinks 2 fifth a day and daily - precedex off without incident - Continues on CIWA protocol - Multivitamins, thiamine - Did receive a dose of phenobarb 260 on 4/15  History of schizophrenia - IVC in place - To be admitted to behavioral therapy once stabilized - On Haldol  Protein calorie malnutrition - He has temporal wasting, cachexia - On a diet at present and tolerating well  Left arm cellulitis - On Keflex  Tobacco abuse - Continue patch  PCCM will sign off at this time Best Practice (right click and "Reselect all SmartList Selections" daily)   Diet/type: Regular consistency (see orders) DVT prophylaxis SCD Pressure ulcer(s): N/A GI prophylaxis: N/A Lines: N/A Foley:  N/A Code Status:  full code Last date of multidisciplinary goals of care discussion per primary   Labs   CBC: Recent Labs  Lab 07/01/23 0634 07/02/23 0427 07/03/23 0634 07/04/23 0349 07/05/23 0319  WBC 8.4 8.0 6.9 7.1 9.4  NEUTROABS  --  3.8 3.2 3.1 5.4  HGB 13.0 15.3 11.9* 11.7* 12.9*  HCT 39.1 44.9 36.2* 35.1* 39.3  MCV 98.7 98.7 100.6* 99.4 100.0  PLT 381 305 357 360 411*    Basic Metabolic Panel: Recent Labs  Lab 06/30/23 2347 07/01/23 0356 07/01/23 6213 07/01/23 1317 07/02/23 0524 07/02/23 1124 07/03/23 0509 07/04/23 0349 07/05/23 0319  NA  --    < > 131*   < > 134* 135 134* 136 134*  K  --    < > 3.3*   < > 4.5 4.5 3.7 3.4* 3.8  CL  --    < > 100   < > 105 104 102 100 97*  CO2  --    < > 25   < > 21* 23 23 28 30   GLUCOSE  --    < > 92   < > 139* 122* 592* 103* 97  BUN  --    < > 13   < > 6 5* 7 9 15   CREATININE  --    < > 0.80   < > 0.76 0.64 0.72 0.58* 0.86  CALCIUM  --    < > 8.6*   < > 8.9 8.9 8.5* 8.8* 9.6  MG 2.1  --  2.3  --   --   --   --   --   --   PHOS 3.1  --   3.6  --   --   --   --   --   --    < > = values in this interval not displayed.   GFR: Estimated Creatinine Clearance: 106.2 mL/min (by C-G formula based on SCr of 0.86 mg/dL). Recent Labs  Lab 07/02/23 0427 07/03/23 0634 07/04/23 0349 07/05/23 0319  WBC 8.0 6.9 7.1 9.4    Liver Function Tests: Recent Labs  Lab 06/30/23 2052 07/01/23 0634 07/04/23 0349 07/05/23 0319  AST 73* 64* 39 42*  ALT 87* 73* 50* 52*  ALKPHOS 66 58 48 55  BILITOT 1.3* 1.1 0.4 0.5  PROT 9.0* 7.8 7.1 7.9  ALBUMIN 3.5 2.9* 2.4* 2.9*   No results for input(s): "LIPASE", "AMYLASE" in the last 168 hours. Recent Labs  Lab 06/30/23 2347  AMMONIA 11    ABG    Component Value Date/Time   TCO2 25 07/16/2021 1153     Coagulation Profile: Recent Labs  Lab 06/30/23 2347 07/05/23 0319  INR 1.1 1.0    Cardiac Enzymes: Recent Labs  Lab 06/30/23 2347  CKTOTAL 191    HbA1C: Hgb A1c MFr Bld  Date/Time Value Ref Range Status  07/09/2022 02:59 PM 5.3 4.8 - 5.6 % Final    Comment:    (NOTE) Pre diabetes:          5.7%-6.4%  Diabetes:              >6.4%  Glycemic control for   <7.0% adults with diabetes   10/31/2020 06:21 PM 5.5 4.8 - 5.6 % Final    Comment:    (NOTE) Pre diabetes:          5.7%-6.4%  Diabetes:              >6.4%  Glycemic control for   <7.0% adults with diabetes     CBG: Recent Labs  Lab 07/02/23 0354 07/03/23 0541  GLUCAP 131* 155*    Review of Systems:   As above  Past Medical History:  He,  has a past medical history of Anxiety, Bipolar 1 disorder (HCC), Depression, Drug-induced seizure (HCC), EtOH dependence (HCC), Hallucination, Paranoid schizophrenia (HCC), and PTSD (post-traumatic stress disorder).   Surgical History:   Past Surgical History:  Procedure Laterality Date   KIDNEY DONATION Right 2003     Social History:   reports that he has been smoking cigarettes. He has never used smokeless tobacco.  He reports that he does not currently use  alcohol. He reports that he does not currently use drugs after having used the following drugs: Cocaine, Heroin, Amphetamines, Methamphetamines, and Marijuana.   Family History:  His family history includes Cancer in his mother; Diabetes in his father and maternal grandmother; Heart disease in his father and mother; Lung cancer in his mother; Stroke in his paternal grandfather.   Allergies Allergies  Allergen Reactions   Penicillins Other (See Comments) and Swelling    Reaction occurred in childhood    Lalla Pill, PA-C [facility] 07/05/23 9:24 AM  Please see [email or web address] for pager details.  From 7A-7P if no response, please call [phone] After hours, please call ELink [phone]

## 2023-07-05 NOTE — Progress Notes (Signed)
 Progress Note   Patient: Jerry [name] NWG:[id number] DOB: 07-26-1975 DOA: 06/30/2023     5 DOS: the patient was seen and examined on 07/05/2023   Brief hospital course: The patient is a 48 yr old man who was brought to [facility] ED by PD from a gas station due to what appeared to be a psychotic episode. The patient was displaying evidence of visual and auditory hallucinations. The voices are paranoid and are telling him that there is someone who has been paid 7500 dollars to kill him.   The patient has a past medical history significant for alcohol abuse and schizophrenia. He also has a history of DT's and polysubstance abuse, GAD, tobacco abuse, malingering, PTSD, and tardive dyskinesia.  He was found to be in DT's in the ED with severe tremors. This improved with Ativan.  His last drink was 3 days ago.   He was admitted to the ICU on CIWA protocol. Since admission the patient has been severely agitated frequently scoring in the 20's for CIWA. He has been oppositional, combative and threatening. He had received 8 mg of ativan by 11:00 am this morning with no effect. The patient has been placed on a precedex drip.  The patient has been evaluated by psychiatry who has recommended admission to [facility] once he has completed Detox. In the meantime the have recommended neurontin 400 mg bid, Haldol 5.0 mg IM or PO q 8 hours.   He was given a 500 cc bolus of LR on the evening of 07/01/2023 for hypotension. After this bolus he was given D5 overnight to slow the correction of his hyponatremia.   Na on the morning of 07/02/2023 was 134 which was appropriate. The patient was sedated on precedex gtt.   Registered dietician was consulted to evaluate the patient for nutritional needs.   The patient is calm and pleasant this morning. He remains on precedex drip.  Critical care was consulted in compliance with ICU policy to consult PCCM after precedex drip has been in place after 24 hours.  They have given the patient 260 mg IV phenobarbital and consulted psychiatry. I greatly appreciate their help.   On 07/04/2023 the patient has been weaned off of precedex. He is interactive and pleasant. He states that he is still having visual hallucinations.  07/05/2023 the patient is less tremulous, but still having visual, but not auditory hallucinations. Psychiatry no longer feels that he needs to discharge to behavioral health. They feel that his visual hallucinations are due to his history of methamphetamine use. The patient remains somewhat tremulous. Will order PT/OT, and plan for possible discharge tomorrow.  Assessment and Plan:  Alcohol Withdrawal:  Acute and severe requiring physical restraints as well as precedex drip. CIWA protocol is ordered. The patient is having active paranoid hallucinations visual and auditory. He has been verbally and physically threatening and abusive to staff. He is kicking and thrashing about in bed. The patient's management had to be escalated from CIWA protocol with ativan to Precedex drip with loading dose. He is also receiving Thiamine and Folate. The patient has been given 260 mg phenobarbital, Precedex has been weaned off. The patient is awake and alert and oriented x 2. He is communicative, still tremulous, and still hallucinating. 07/05/2023 the patient is less tremulous, but still having visual, but not auditory hallucinations. Psychiatry no longer feels that he needs to discharge to behavioral health. They feel that his visual hallucinations are due to his history of methamphetamine use.  The patient remains somewhat tremulous. Will order PT/OT, and plan for possible discharge tomorrow.  Hypotension:  Resolved. Likely due to poor fluid intake since admission and effect of ativan and precedex. The patient was given a 1L bolus of LR on the evening of 07/01/2023. Monitor blood pressure, creatinine, electrolytes, and volume status.  Severe Protein Calorie  Malnutrition:  Pt with cachexia and temporal wasting. Prealbumin less than 10. Dietician consult. Continue IV fluids with D5. Monitor electrolytes. RD consulted to assist. The patient is tolerating his regular diet. Recommend boost shakes.  Left arm Cellulitis:  Resolving. The patient is receiving Cefepime 2 gm IV q 8 hours.. Blood culture x 2 have been drawn. The patient has screened positive for MRSA, but this cellulitis does no appear purulent.  Hyponatremia: Resolved. Likely due to alcohol. Na has increased from 124 upon admission at 2000 to 133 this afternoon at 1300. He was correcting too quickly. Fluids were changed to D5 overnight at 125. He was at 134 this am. He was then placed on Dr. Stephenie Einstein.   Tobacco abuse:  Nicotine patch has been applied.  Schizophrenia: Haldol 5 mg PO or IM as per psychiatry recommendations. Pt will ben transferred to [facility] health unit after he is out of detox. The patient has been displaying visual and auditory hallucinations. Psychiatry was consulted. They have evaluated the patient and have determined that he does not meet criteria for inpatient psychiatric treatment or involuntary commitment. They have started the patient on Risperdal.     Subjective: Pt is lying in bed sleeping. He is easily rousable. He is pleasant, interactive and cooperative.  Physical Exam: Vitals:   07/05/23 0200 07/05/23 0400 07/05/23 0800 07/05/23 1200  BP: 109/78 136/89    Pulse: 100 95    Resp: (!) 24 (!) 25    Temp:  98.2 F (36.8 C) 98.2 F (36.8 C) 98.4 F (36.9 C)  TempSrc:  Oral Oral Axillary  SpO2: 96% 93%    Weight:      Height:       Exam:  Constitutional:  The patient is awake and alert x 2.  No new complaints. Still with visual hallucinations.  Head             Temporal Wasting Respiratory:  The patient is tachypneic. No wheezes, rales, or rhonchi No tactile fremitus Cardiovascular:  Rate is fast. No murmurs, ectopy, or gallups. No lateral  PMI. No thrills. Abdomen:  Abdomen is soft, non-tender, non-distended Abdomen is scaffoid. No hernias, masses, or organomegaly Normoactive bowel sounds.  Musculoskeletal:  No cyanosis, clubbing, or edema Cachectic Skin:  No rashes, lesions, ulcers palpation of skin: no induration or nodules Neurologic:  Patient is unable to cooperate with exam. Psychiatric:  Patient is unable to cooperate with exam.  Data Reviewed:  Telemetry CBC BMP  Family Communication: None available  Disposition: Status is: Inpatient Remains inpatient appropriate because: Pt is in DT's and requires close monitoring and treatment or suffer organ system deterioration or death.  Planned Discharge Destination: Home    Time spent: 34 minutes  Author: Bryam Taborda, DO 07/05/2023 2:40 PM  For on call review [email or web address].
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
## 2023-07-05 NOTE — Plan of Care (Signed)
 Patient CIWA scores remain high, PRN ativan being given, plan of care and goals discussed with time given for questions and answers, patient handbook/guide at bedside.  Problem: Education: Goal: Knowledge of General Education information will improve Description: Including pain rating scale, medication(s)/side effects and non-pharmacologic comfort measures Outcome: Progressing   Problem: Health Behavior/Discharge Planning: Goal: Ability to manage health-related needs will improve Outcome: Progressing   Problem: Clinical Measurements: Goal: Ability to maintain clinical measurements within normal limits will improve Outcome: Progressing Goal: Will remain free from infection Outcome: Progressing Goal: Diagnostic test results will improve Outcome: Progressing Goal: Respiratory complications will improve Outcome: Progressing Goal: Cardiovascular complication will be avoided Outcome: Progressing   Problem: Activity: Goal: Risk for activity intolerance will decrease Outcome: Progressing   Problem: Nutrition: Goal: Adequate nutrition will be maintained Outcome: Progressing   Problem: Coping: Goal: Level of anxiety will decrease Outcome: Progressing   Problem: Elimination: Goal: Will not experience complications related to bowel motility Outcome: Progressing Goal: Will not experience complications related to urinary retention Outcome: Progressing   Problem: Pain Managment: Goal: General experience of comfort will improve and/or be controlled Outcome: Progressing   Problem: Safety: Goal: Ability to remain free from injury will improve Outcome: Progressing   Problem: Skin Integrity: Goal: Risk for impaired skin integrity will decrease Outcome: Progressing   Problem: Clinical Measurements: Goal: Ability to avoid or minimize complications of infection will improve Outcome: Progressing   Problem: Skin Integrity: Goal: Skin integrity will improve Outcome: Progressing    Problem: Safety: Goal: Violent Restraint(s) Outcome: Progressing   Problem: Safety: Goal: Non-violent Restraint(s) Outcome: Progressing

## 2023-07-05 NOTE — TOC Progression Note (Signed)
 Transition of Care [facility]) - Progression Note    Patient Details  Name: TIMM BONENBERGER MRN: [id number] Date of Birth: 01/10/1976  Transition of Care [facility]) CM/SW Contact  Levie Ream, RN Phone Number: 07/05/2023, 4:55 PM  Clinical Narrative:    Received Notice of Commitment Change; IVC File # [id number]; scanned into EPIC; e-filed by Corwin Dings, NS; received Envelope # [id number].   Expected Discharge Plan: Home/Self Care Barriers to Discharge: Continued Medical Work up  Expected Discharge Plan and Services                                               Social Determinants of Health (SDOH) Interventions SDOH Screenings   Food Insecurity: No Food Insecurity (07/03/2023)  Recent Concern: Food Insecurity - Food Insecurity Present (07/01/2023)  Housing: High Risk (07/03/2023)  Transportation Needs: No Transportation Needs (07/03/2023)  Recent Concern: Transportation Needs - Unmet Transportation Needs (07/01/2023)  Utilities: Not At Risk (07/01/2023)  Alcohol Screen: Medium Risk (10/30/2020)  Depression (PHQ2-9): High Risk (11/29/2022)  Social Connections: Socially Isolated (07/01/2023)  Tobacco Use: High Risk (06/30/2023)    Readmission Risk Interventions    07/03/2023   10:46 AM  Readmission Risk Prevention Plan  PCP or Specialist Appt within 3-5 Days Complete  HRI or Home Care Consult Complete  Social Work Consult for Recovery Care Planning/Counseling Complete  Palliative Care Screening Complete

## 2023-07-06 LAB — CBC WITH DIFFERENTIAL/PLATELET
Abs Immature Granulocytes: 0.16 10*3/uL — ABNORMAL HIGH (ref 0.00–0.07)
Basophils Absolute: 0.1 10*3/uL (ref 0.0–0.1)
Basophils Relative: 1 %
Eosinophils Absolute: 0.5 10*3/uL (ref 0.0–0.5)
Eosinophils Relative: 4 %
HCT: 43.4 % (ref 39.0–52.0)
Hemoglobin: 13.5 g/dL (ref 13.0–17.0)
Immature Granulocytes: 1 %
Lymphocytes Relative: 21 %
Lymphs Abs: 2.7 10*3/uL (ref 0.7–4.0)
MCH: 32.9 pg (ref 26.0–34.0)
MCHC: 31.1 g/dL (ref 30.0–36.0)
MCV: 105.9 fL — ABNORMAL HIGH (ref 80.0–100.0)
Monocytes Absolute: 1.4 10*3/uL — ABNORMAL HIGH (ref 0.1–1.0)
Monocytes Relative: 11 %
Neutro Abs: 8.2 10*3/uL — ABNORMAL HIGH (ref 1.7–7.7)
Neutrophils Relative %: 62 %
Platelets: 431 10*3/uL — ABNORMAL HIGH (ref 150–400)
RBC: 4.1 MIL/uL — ABNORMAL LOW (ref 4.22–5.81)
RDW: 13.2 % (ref 11.5–15.5)
WBC: 13 10*3/uL — ABNORMAL HIGH (ref 4.0–10.5)
nRBC: 0 % (ref 0.0–0.2)

## 2023-07-06 LAB — CULTURE, BLOOD (ROUTINE X 2)
Culture: NO GROWTH
Culture: NO GROWTH

## 2023-07-06 LAB — BASIC METABOLIC PANEL WITH GFR
Anion gap: 11 (ref 5–15)
BUN: 15 mg/dL (ref 6–20)
CO2: 23 mmol/L (ref 22–32)
Calcium: 9 mg/dL (ref 8.9–10.3)
Chloride: 98 mmol/L (ref 98–111)
Creatinine, Ser: 0.59 mg/dL — ABNORMAL LOW (ref 0.61–1.24)
GFR, Estimated: >60 mL/min (ref >60)
Glucose, Bld: 113 mg/dL — ABNORMAL HIGH (ref 70–99)
Potassium: 4.3 mmol/L (ref 3.5–5.1)
Sodium: 132 mmol/L — ABNORMAL LOW (ref 135–145)

## 2023-07-06 NOTE — Progress Notes (Signed)
 Patient pulled out his PICC line, PICC intact line 41 cm, no bleeding noted, dressing applied. Lab, charge nurse and NP aware.

## 2023-07-06 NOTE — Discharge Summary (Signed)
 Physician Discharge Summary   Patient: Jerry [name] MRN: [id number] DOB: [birth year]  Admit date:     06/30/2023  Discharge date: 07/06/23  Discharge Physician: Junita Oliva   PCP: Paseda, Folashade R, FNP   Recommendations at discharge:    Patient left AMA  Discharge Diagnoses: Principal Problem:   Alcohol withdrawal (HCC) Active Problems:   Alcohol abuse   Polysubstance abuse (HCC)   Depression, unspecified   Tobacco use disorder   Homelessness   Schizophrenia (HCC)   Tardive dyskinesia   Hyponatremia   History of hepatitis C virus infection   Left arm cellulitis   Protein-calorie malnutrition, severe  Resolved Problems:   * No resolved hospital problems. [facility] Course: The patient is a 48 yr old man who was brought to [facility] ED by PD from a gas station due to what appeared to be a psychotic episode. The patient was displaying evidence of visual and auditory hallucinations. The voices are paranoid and are telling him that there is someone who has been paid 7500 dollars to kill him.    The patient has a past medical history significant for alcohol abuse and schizophrenia. He also has a history of DT's and polysubstance abuse, GAD, tobacco abuse, malingering, PTSD, and tardive dyskinesia.   He was found to be in DT's in the ED with severe tremors. This improved with Ativan .  His last drink was 3 days ago.    He was admitted to the ICU on CIWA protocol. Since admission the patient has been severely agitated frequently scoring in the 20's for CIWA. He has been oppositional, combative and threatening. He had received 8 mg of ativan  by 11:00 am this morning with no effect. The patient has been placed on a precedex  drip.   The patient has been evaluated by psychiatry who has recommended admission to [facility] once he has completed Detox. In the meantime the have recommended neurontin  400 mg bid, Haldol  5.0 mg IM or PO q 8 hours.    He was given a 500 cc  bolus of LR on the evening of 07/01/2023 for hypotension. After this bolus he was given D5 overnight to slow the correction of his hyponatremia.    Na on the morning of 07/02/2023 was 134 which was appropriate. The patient was sedated on precedex  gtt.    Registered dietician was consulted to evaluate the patient for nutritional needs.    The patient is calm and pleasant this morning. He remains on precedex  drip.   Critical care was consulted in compliance with ICU policy to consult PCCM after precedex  drip has been in place after 24 hours. They have given the patient 260 mg IV phenobarbital  and consulted psychiatry. I greatly appreciate their help.    On 07/04/2023 the patient has been weaned off of precedex . He is interactive and pleasant. He states that he is still having visual hallucinations.   07/05/2023 the patient is less tremulous, but still having visual, but not auditory hallucinations. Psychiatry no longer feels that he needs to discharge to behavioral health. They feel that his visual hallucinations are due to his history of methamphetamine use. The patient remains somewhat tremulous.   07/06/2023 The patient removed PICC himself at 6:00 am. He left AMA before 8:00 am.  Assessment and Plan: Alcohol Withdrawal:  Acute and severe requiring physical restraints as well as precedex  drip. CIWA protocol is ordered. The patient is having active paranoid hallucinations visual and auditory. He has been verbally and  physically threatening and abusive to staff. He is kicking and thrashing about in bed. The patient's management had to be escalated from CIWA protocol with ativan  to Precedex  drip with loading dose. He is also receiving Thiamine  and Folate. The patient has been given 260 mg phenobarbital , Precedex  has been weaned off. The patient is awake and alert and oriented x 2. He is communicative, still tremulous, and still hallucinating. 07/05/2023 the patient is less tremulous, but still having  visual, but not auditory hallucinations. Psychiatry no longer feels that he needs to discharge to behavioral health. They feel that his visual hallucinations are due to his history of methamphetamine use. The patient remained somewhat tremulous on 07/05/2023. The plan was to re-evaluate this morning, but he left AMA before he  could be evaluated.   Hypotension:  Resolved. Likely due to poor fluid intake since admission and effect of ativan  and precedex . The patient was given a 1L bolus of LR on the evening of 07/01/2023. Monitor blood pressure, creatinine, electrolytes, and volume status.   Severe Protein Calorie Malnutrition:  Pt with cachexia and temporal wasting. Prealbumin less than 10. Dietician consult. Continue IV fluids with D5. Monitor electrolytes. RD consulted to assist. The patient is tolerating his regular diet. Recommend boost shakes.   Left arm Cellulitis:  Resolving. The patient is receiving Cefepime  2 gm IV q 8 hours.. Blood culture x 2 have been drawn. The patient has screened positive for MRSA, but this cellulitis does no appear purulent.   Hyponatremia: Resolved. Likely due to alcohol. Na has increased from 124 upon admission at 2000 to 133 this afternoon at 1300. He was correcting too quickly. Fluids were changed to D5 overnight at 125. He was at 134 this am. He was then placed on Dr. Stephenie Einstein.    Tobacco abuse:  Nicotine  patch has been applied.   Schizophrenia: Haldol  5 mg PO or IM as per psychiatry recommendations. Pt will ben transferred to [facility] health unit after he is out of detox. The patient has been displaying visual and auditory hallucinations. Psychiatry was consulted. They have evaluated the patient and have determined that he does not meet criteria for inpatient psychiatric treatment or involuntary commitment. They have started the patient on Risperdal .       Consultants: Psychiatry, PCCM Procedures performed: None  Disposition:  Left AMA Diet  recommendation: AMA  DISCHARGE MEDICATION: The patient left AMA  Discharge Exam: Filed Weights   07/01/23 0943  Weight: 72.6 kg   The patient left AMA before he could be examined.  Condition at discharge:  Patient left AMA before he could be examined.  The results of significant diagnostics from this hospitalization (including imaging, microbiology, ancillary and laboratory) are listed below for reference.   Imaging Studies: US  EKG SITE RITE Result Date: 07/01/2023 If Site Rite image not attached, placement could not be confirmed due to current cardiac rhythm.  ECHOCARDIOGRAM COMPLETE Result Date: 07/01/2023    ECHOCARDIOGRAM REPORT   Patient Name:   EMMERSON TADDEI Date of Exam: 07/01/2023 Medical Rec #:  [id number]     Height:       69.0 in Accession #:    [id number]    Weight:       160.1 lb Date of Birth:  Mar 10, 1976     BSA:          1.879 m Patient Age:    47 years      BP:           118/73  mmHg Patient Gender: M             HR:           75 bpm. Exam Location:  Inpatient Procedure: 2D Echo, Cardiac Doppler and Color Doppler (Both Spectral and Color            Flow Doppler were utilized during procedure). Indications:    Abnormal ECG  History:        Patient has no prior history of Echocardiogram examinations.  Sonographer:    Juanita Shaw Referring Phys: [id number] ANASTASSIA DOUTOVA IMPRESSIONS  1. No strain or 3D transmitted. Left ventricular ejection fraction, by estimation, is 55 to 60%. The left ventricle has normal function. Left ventricular endocardial border not optimally defined to evaluate regional wall motion. Left ventricular diastolic parameters were normal.  2. Right ventricular systolic function is normal. The right ventricular size is normal.  3. The mitral valve is normal in structure. No evidence of mitral valve regurgitation. No evidence of mitral stenosis.  4. The aortic valve is tricuspid. Aortic valve regurgitation is not visualized. No aortic stenosis is present.  5. The  inferior vena cava is normal in size with greater than 50% respiratory variability, suggesting right atrial pressure of 3 mmHg. Comparison(s): No prior Echocardiogram. FINDINGS  Left Ventricle: No strain or 3D transmitted. Left ventricular ejection fraction, by estimation, is 55 to 60%. The left ventricle has normal function. Left ventricular endocardial border not optimally defined to evaluate regional wall motion. Strain was performed and the global longitudinal strain is indeterminate. The left ventricular internal cavity size was normal in size. There is no left ventricular hypertrophy. Left ventricular diastolic parameters were normal. Right Ventricle: The right ventricular size is normal. No increase in right ventricular wall thickness. Right ventricular systolic function is normal. Left Atrium: Left atrial size was normal in size. Right Atrium: Right atrial size was normal in size. Pericardium: There is no evidence of pericardial effusion. Mitral Valve: The mitral valve is normal in structure. No evidence of mitral valve regurgitation. No evidence of mitral valve stenosis. MV peak gradient, 1.6 mmHg. The mean mitral valve gradient is 1.0 mmHg. Tricuspid Valve: The tricuspid valve is normal in structure. Tricuspid valve regurgitation is trivial. No evidence of tricuspid stenosis. Aortic Valve: The aortic valve is tricuspid. Aortic valve regurgitation is not visualized. No aortic stenosis is present. Aortic valve mean gradient measures 3.0 mmHg. Aortic valve peak gradient measures 4.5 mmHg. Aortic valve area, by VTI measures 2.85 cm. Pulmonic Valve: The pulmonic valve was normal in structure. Pulmonic valve regurgitation is not visualized. No evidence of pulmonic stenosis. Aorta: The aortic root and ascending aorta are structurally normal, with no evidence of dilitation. Venous: The inferior vena cava is normal in size with greater than 50% respiratory variability, suggesting right atrial pressure of 3 mmHg.  IAS/Shunts: The atrial septum is grossly normal. Additional Comments: 3D was performed not requiring image post processing on an independent workstation and was indeterminate.  LEFT VENTRICLE PLAX 2D LVIDd:         4.70 cm      Diastology LVIDs:         3.30 cm      LV e' medial:    8.81 cm/s LV PW:         1.00 cm      LV E/e' medial:  7.1 LV IVS:        0.90 cm      LV e' lateral:   10.00  cm/s LVOT diam:     1.90 cm      LV E/e' lateral: 6.3 LV SV:         45 LV SV Index:   24 LVOT Area:     2.84 cm  LV Volumes (MOD) LV vol d, MOD A2C: 114.0 ml LV vol d, MOD A4C: 139.0 ml LV vol s, MOD A2C: 42.9 ml LV vol s, MOD A4C: 42.5 ml LV SV MOD A2C:     71.1 ml LV SV MOD A4C:     139.0 ml LV SV MOD BP:      86.0 ml RIGHT VENTRICLE             IVC RV Basal diam:  3.60 cm     IVC diam: 1.90 cm RV Mid diam:    2.80 cm RV S prime:     12.50 cm/s TAPSE (M-mode): 2.1 cm LEFT ATRIUM             Index        RIGHT ATRIUM           Index LA diam:        2.70 cm 1.44 cm/m   RA Area:     11.70 cm LA Vol (A2C):   26.4 ml 14.05 ml/m  RA Volume:   24.30 ml  12.93 ml/m LA Vol (A4C):   32.4 ml 17.24 ml/m LA Biplane Vol: 31.2 ml 16.60 ml/m  AORTIC VALVE                    PULMONIC VALVE AV Area (Vmax):    2.56 cm     PV Vmax:       0.84 m/s AV Area (Vmean):   2.50 cm     PV Peak grad:  2.8 mmHg AV Area (VTI):     2.85 cm AV Vmax:           106.00 cm/s AV Vmean:          74.400 cm/s AV VTI:            0.159 m AV Peak Grad:      4.5 mmHg AV Mean Grad:      3.0 mmHg LVOT Vmax:         95.80 cm/s LVOT Vmean:        65.500 cm/s LVOT VTI:          0.160 m LVOT/AV VTI ratio: 1.01  AORTA Ao Root diam: 3.60 cm Ao Asc diam:  2.90 cm MITRAL VALVE MV Area (PHT): 2.82 cm    SHUNTS MV Area VTI:   2.86 cm    Systemic VTI:  0.16 m MV Peak grad:  1.6 mmHg    Systemic Diam: 1.90 cm MV Mean grad:  1.0 mmHg MV Vmax:       0.63 m/s MV Vmean:      47.4 cm/s MV Decel Time: 269 msec MV E velocity: 62.90 cm/s MV A velocity: 54.70 cm/s MV E/A ratio:   1.15 Gloriann Larger MD Electronically signed by Gloriann Larger MD Signature Date/Time: 07/01/2023/3:45:53 PM    Final    US  Abdomen Limited RUQ (LIVER/GB) Result Date: 07/01/2023 CLINICAL DATA:  Elevated LFTs EXAM: ULTRASOUND ABDOMEN LIMITED RIGHT UPPER QUADRANT COMPARISON:  None Available. FINDINGS: Gallbladder: No gallstones or wall thickening visualized. No sonographic Murphy sign noted by sonographer. Common bile duct: Diameter: 5 mm Liver: Mildly increased echogenicity consistent with fatty infiltration. No mass is noted. Portal vein  is patent on color Doppler imaging with normal direction of blood flow towards the liver. Other: None. IMPRESSION: Fatty liver. No other focal abnormality is noted. Electronically Signed   By: Violeta Grey M.D.   On: 07/01/2023 01:27   DG Forearm Left Result Date: 07/01/2023 CLINICAL DATA:  Left forearm cellulitis EXAM: LEFT FOREARM - 2 VIEW COMPARISON:  None Available. FINDINGS: No acute fracture or dislocation is noted. Area of lucency is noted in the distal aspect of radial diaphysis likely related to small bone cyst. No soft tissue abnormality is seen. No foreign body is noted. IMPRESSION: No acute abnormality to correspond with the given clinical history. Lucency in the distal radius likely related to a bone cyst. Electronically Signed   By: Violeta Grey M.D.   On: 07/01/2023 00:49   DG Chest Port 1 View Result Date: 07/01/2023 CLINICAL DATA:  Dyspnea EXAM: PORTABLE CHEST 1 VIEW COMPARISON:  04/10/2023 FINDINGS: Cardiac shadow is within normal limits. Lungs are well aerated bilaterally. No focal infiltrate or effusion is noted. No soft tissue abnormality is seen. IMPRESSION: No acute abnormality noted. Electronically Signed   By: Violeta Grey M.D.   On: 07/01/2023 00:11    Microbiology: Results for orders placed or performed during the hospital encounter of 06/30/23  Culture, blood (routine x 2)     Status: None   Collection Time: 07/01/23  1:27 AM    Specimen: BLOOD  Result Value Ref Range Status   Specimen Description   Final    BLOOD LEFT ANTECUBITAL Performed at [facility], [address] [place], [place] [address]    Special Requests   Final    BOTTLES DRAWN AEROBIC AND ANAEROBIC Blood Culture results may not be optimal due to an inadequate volume of blood received in culture bottles Performed at [facility], [address], [place], [place] [address]    Culture   Final    NO GROWTH 5 DAYS Performed at [facility], [address], [place], [place] [address]    Report Status 07/06/2023 FINAL  Final  MRSA Next Gen by PCR, Nasal     Status: Abnormal   Collection Time: 07/01/23  9:19 AM   Specimen: Nasal Mucosa; Nasal Swab  Result Value Ref Range Status   MRSA by PCR Next Gen DETECTED (A) NOT DETECTED Final    Comment: J. SANTIAGO,RN ON 07/01/2023 AT 1054 AM BY SL (NOTE) The GeneXpert MRSA Assay (FDA approved for NASAL specimens only), is one component of a comprehensive MRSA colonization surveillance program. It is not intended to diagnose MRSA infection nor to guide or monitor treatment for MRSA infections. Test performance is not FDA approved in patients less than 83 years old. Performed at [facility], [address] [place], [place] [address]   Culture, blood (routine x 2)     Status: None   Collection Time: 07/01/23  1:17 PM   Specimen: BLOOD  Result Value Ref Range Status   Specimen Description   Final    BLOOD BLOOD LEFT HAND Performed at [facility], [address], [place], [place] [address]    Special Requests   Final    BOTTLES DRAWN AEROBIC ONLY Blood Culture results may not be optimal due to an inadequate volume of blood received in culture bottles Performed at [facility], [address] [place], [place] [address]    Culture   Final    NO GROWTH 5 DAYS  Performed at [facility], [address], [place], [place] [address]    Report Status 07/06/2023 FINAL  Final    Labs: CBC: Recent Labs  Lab 07/02/23 0427 07/03/23 0634 07/04/23 0349 07/05/23 0319 07/06/23 0751  WBC 8.0 6.9 7.1 9.4 13.0*  NEUTROABS 3.8 3.2 3.1 5.4 8.2*  HGB 15.3 11.9* 11.7* 12.9* 13.5  HCT 44.9 36.2* 35.1* 39.3 43.4  MCV 98.7 100.6* 99.4 100.0 105.9*  PLT 305 357 360 411* 431*   Basic Metabolic Panel: Recent Labs  Lab 06/30/23 2347 07/01/23 0356 07/01/23 0634 07/01/23 1317 07/02/23 1124 07/03/23 0509 07/04/23 0349 07/05/23 0319 07/06/23 0751  NA  --    < > 131*   < > 135 134* 136 134* 132*  K  --    < > 3.3*   < > 4.5 3.7 3.4* 3.8 4.3  CL  --    < > 100   < > 104 102 100 97* 98  CO2  --    < > 25   < > 23 23 28 30 23   GLUCOSE  --    < > 92   < > 122* 592* 103* 97 113*  BUN  --    < > 13   < > 5* 7 9 15 15   CREATININE  --    < > 0.80   < > 0.64 0.72 0.58* 0.86 0.59*  CALCIUM  --    < > 8.6*   < > 8.9 8.5* 8.8* 9.6 9.0  MG 2.1  --  2.3  --   --   --   --   --   --   PHOS 3.1  --  3.6  --   --   --   --   --   --    < > = values in this interval not displayed.   Liver Function Tests: Recent Labs  Lab 06/30/23 2052 07/01/23 0634 07/04/23 0349 07/05/23 0319  AST 73* 64* 39 42*  ALT 87* 73* 50* 52*  ALKPHOS 66 58 48 55  BILITOT 1.3* 1.1 0.4 0.5  PROT 9.0* 7.8 7.1 7.9  ALBUMIN 3.5 2.9* 2.4* 2.9*   CBG: Recent Labs  Lab 07/02/23 0354 07/03/23 0541  GLUCAP 131* 155*    Discharge time spent: greater than 30 minutes.  Signed: Dalen Hennessee, DO [facility] 07/06/2023

## 2023-07-11 ENCOUNTER — Other Ambulatory Visit: Payer: Self-pay

## 2023-07-11 ENCOUNTER — Emergency Department (HOSPITAL_COMMUNITY): Payer: MEDICAID

## 2023-07-11 ENCOUNTER — Encounter (HOSPITAL_COMMUNITY): Payer: Self-pay | Admitting: Emergency Medicine

## 2023-07-11 ENCOUNTER — Emergency Department (HOSPITAL_COMMUNITY)
Admission: EM | Admit: 2023-07-11 | Discharge: 2023-07-12 | Disposition: A | Discharge status: Home / Self Care | Payer: MEDICAID

## 2023-07-11 DIAGNOSIS — F600 Paranoid personality disorder: Secondary | ICD-10-CM | POA: Diagnosis present

## 2023-07-11 DIAGNOSIS — Z5329 Procedure and treatment not carried out because of patient's decision for other reasons: Secondary | ICD-10-CM | POA: Diagnosis not present

## 2023-07-11 DIAGNOSIS — F22 Delusional disorders: Secondary | ICD-10-CM

## 2023-07-11 DIAGNOSIS — F1010 Alcohol abuse, uncomplicated: Secondary | ICD-10-CM | POA: Diagnosis not present

## 2023-07-11 LAB — CBC WITH DIFFERENTIAL/PLATELET
Abs Immature Granulocytes: 0.02 10*3/uL (ref 0.00–0.07)
Basophils Absolute: 0.1 10*3/uL (ref 0.0–0.1)
Basophils Relative: 1 %
Eosinophils Absolute: 0.5 10*3/uL (ref 0.0–0.5)
Eosinophils Relative: 5 %
HCT: 39.3 % (ref 39.0–52.0)
Hemoglobin: 13.1 g/dL (ref 13.0–17.0)
Immature Granulocytes: 0 %
Lymphocytes Relative: 29 %
Lymphs Abs: 2.7 10*3/uL (ref 0.7–4.0)
MCH: 33 pg (ref 26.0–34.0)
MCHC: 33.3 g/dL (ref 30.0–36.0)
MCV: 99 fL (ref 80.0–100.0)
Monocytes Absolute: 1.2 10*3/uL — ABNORMAL HIGH (ref 0.1–1.0)
Monocytes Relative: 13 %
Neutro Abs: 4.8 10*3/uL (ref 1.7–7.7)
Neutrophils Relative %: 52 %
Platelets: 558 10*3/uL — ABNORMAL HIGH (ref 150–400)
RBC: 3.97 MIL/uL — ABNORMAL LOW (ref 4.22–5.81)
RDW: 13.2 % (ref 11.5–15.5)
WBC: 9.3 10*3/uL (ref 4.0–10.5)
nRBC: 0 % (ref 0.0–0.2)

## 2023-07-11 LAB — COMPREHENSIVE METABOLIC PANEL WITH GFR
ALT: 55 U/L — ABNORMAL HIGH (ref 0–44)
AST: 48 U/L — ABNORMAL HIGH (ref 15–41)
Albumin: 3.5 g/dL (ref 3.5–5.0)
Alkaline Phosphatase: 59 U/L (ref 38–126)
Anion gap: 12 (ref 5–15)
BUN: 10 mg/dL (ref 6–20)
CO2: 22 mmol/L (ref 22–32)
Calcium: 9.2 mg/dL (ref 8.9–10.3)
Chloride: 100 mmol/L (ref 98–111)
Creatinine, Ser: 0.9 mg/dL (ref 0.61–1.24)
GFR, Estimated: >60 mL/min (ref >60)
Glucose, Bld: 106 mg/dL — ABNORMAL HIGH (ref 70–99)
Potassium: 3.7 mmol/L (ref 3.5–5.1)
Sodium: 134 mmol/L — ABNORMAL LOW (ref 135–145)
Total Bilirubin: 0.6 mg/dL (ref 0.0–1.2)
Total Protein: 8.9 g/dL — ABNORMAL HIGH (ref 6.5–8.1)

## 2023-07-11 LAB — ETHANOL: Alcohol, Ethyl (B): <15 mg/dL (ref <15)

## 2023-07-11 LAB — ACETAMINOPHEN LEVEL: Acetaminophen (Tylenol), Serum: <10 ug/mL — ABNORMAL LOW (ref 10–30)

## 2023-07-11 LAB — CK: Total CK: 223 U/L (ref 49–397)

## 2023-07-11 LAB — SALICYLATE LEVEL: Salicylate Lvl: <7 mg/dL — ABNORMAL LOW (ref 7.0–30.0)

## 2023-07-11 LAB — AMMONIA: Ammonia: 56 umol/L — ABNORMAL HIGH (ref 9–35)

## 2023-07-11 MED ORDER — LORAZEPAM 1 MG PO TABS: 0.0000 mg | ORAL_TABLET | Freq: Four times a day (QID) | ORAL | Status: DC

## 2023-07-11 MED ORDER — HALOPERIDOL 5 MG PO TABS: 5.0000 mg | ORAL_TABLET | Freq: Once | ORAL | Status: DC

## 2023-07-11 MED ORDER — LORAZEPAM 2 MG/ML IJ SOLN: 0.0000 mg | Freq: Four times a day (QID) | INTRAMUSCULAR | Status: DC

## 2023-07-11 MED ORDER — LORAZEPAM 1 MG PO TABS: 0.0000 mg | ORAL_TABLET | Freq: Two times a day (BID) | ORAL | Status: DC

## 2023-07-11 MED ORDER — THIAMINE HCL 100 MG/ML IJ SOLN: 100.0000 mg | Freq: Every day | INTRAMUSCULAR | Status: DC

## 2023-07-11 MED ORDER — LORAZEPAM 1 MG PO TABS
2.0000 mg | ORAL_TABLET | Freq: Once | ORAL | Status: AC
  Administered 2023-07-11: 2 mg via ORAL
  Filled 2023-07-11: qty 2

## 2023-07-11 MED ORDER — THIAMINE MONONITRATE 100 MG PO TABS: 100.0000 mg | ORAL_TABLET | Freq: Every day | ORAL | Status: DC

## 2023-07-11 MED ORDER — LORAZEPAM 2 MG/ML IJ SOLN: 0.0000 mg | Freq: Two times a day (BID) | INTRAMUSCULAR | Status: DC

## 2023-07-11 NOTE — ED Triage Notes (Signed)
 Pt BIB by [facility] from [facility] for paranoia. Hx of schizophrenia, non-compliant with medications x 2 months. Admits to using meth x 2 days ago which worsened symptoms.

## 2023-07-11 NOTE — ED Provider Notes (Signed)
 [place] EMERGENCY DEPARTMENT AT [facility] Provider Note   CSN: [id number] Arrival date & time: 07/11/23  1837    History  Chief Complaint  Patient presents with   Paranoid    Jerry [name] is a 48 y.o. male history of EtOH abuse, Dts, medication noncompliance, schizoaffective disorder, polysubstance use seizures, SI here for evaluation of paranoia.  Patient brought in by EMS from [place].  Recent admission for paranoia, DTs requiring Precedex  drip, phenobarbital .  Subsequently left AGAINST MEDICAL ADVICE on/18/25.  He states his last alcoholic beverage was 2 days ago.  He used methamphetamine approximately 3 days ago.  Patient admits to paranoia with auditory and visual hallucinations telling him to kill himself and others.  Patient with rapid, pressured speech.  He is tremulous. States he has a HA, tremulous, nauseous.  States he has had 2 falls over is unable to tell me circumstances surrounding this.  Low 5 caveat-altered  Hx of ETOH WD seizures, delirium  HPI     Home Medications Prior to Admission medications   Not on File      Allergies    Penicillins    Review of Systems   Review of Systems  Unable to perform ROS: Mental status change  Skin:  Positive for rash.  Neurological:  Positive for headaches.  Psychiatric/Behavioral:  Positive for agitation, behavioral problems, confusion, hallucinations, sleep disturbance and suicidal ideas. The patient is nervous/anxious.   All other systems reviewed and are negative.  Physical Exam Updated Vital Signs BP 120/77 (BP Location: Right Arm)   Pulse 84   Temp 98.5 F (36.9 C) (Oral)   Resp 18   SpO2 100%  Physical Exam Vitals and nursing note reviewed.  Constitutional:      General: He is not in acute distress.    Appearance: He is well-developed. He is not ill-appearing, toxic-appearing or diaphoretic.  HENT:     Head: Normocephalic and atraumatic.     Nose: Nose normal.     Mouth/Throat:     Mouth:  Mucous membranes are moist.  Eyes:     Pupils: Pupils are equal, round, and reactive to light.  Cardiovascular:     Rate and Rhythm: Normal rate and regular rhythm.     Pulses: Normal pulses.     Heart sounds: Normal heart sounds.  Pulmonary:     Effort: Pulmonary effort is normal. No respiratory distress.     Breath sounds: Normal breath sounds.  Abdominal:     General: Bowel sounds are normal. There is no distension.     Palpations: Abdomen is soft.     Tenderness: There is no abdominal tenderness. There is no guarding or rebound.  Musculoskeletal:        General: Normal range of motion.     Cervical back: Normal range of motion and neck supple.     Comments: Moves all 4 extremities without difficulty  Skin:    General: Skin is warm and dry.     Comments: Mild erythema left forearm no areas of induration  Neurological:     Mental Status: He is alert.     Comments: Tremors on BIL upper extremities    ED Results / Procedures / Treatments   Labs (all labs ordered are listed, but only abnormal results are displayed) Labs Reviewed  COMPREHENSIVE METABOLIC PANEL WITH GFR - Abnormal; Notable for the following components:      Result Value   Sodium 134 (*)    Glucose, Bld  106 (*)    Total Protein 8.9 (*)    AST 48 (*)    ALT 55 (*)    All other components within normal limits  CBC WITH DIFFERENTIAL/PLATELET - Abnormal; Notable for the following components:   RBC 3.97 (*)    Platelets 558 (*)    Monocytes Absolute 1.2 (*)    All other components within normal limits  SALICYLATE LEVEL - Abnormal; Notable for the following components:   Salicylate Lvl <7.0 (*)    All other components within normal limits  ACETAMINOPHEN  LEVEL - Abnormal; Notable for the following components:   Acetaminophen  (Tylenol ), Serum <10 (*)    All other components within normal limits  AMMONIA - Abnormal; Notable for the following components:   Ammonia 56 (*)    All other components within normal  limits  ETHANOL  CK  RAPID URINE DRUG SCREEN, HOSP PERFORMED    EKG None  Radiology DG Chest Portable 1 View Result Date: 07/11/2023 CLINICAL DATA:  Fall EXAM: PORTABLE CHEST 1 VIEW COMPARISON:  Chest x-ray 07/01/2023 skin is FINDINGS: The heart size and mediastinal contours are within normal limits. Both lungs are clear. The visualized skeletal structures are unremarkable. IMPRESSION: No active disease. Electronically Signed   By: Tyron Gallon M.D.   On: 07/11/2023 21:44   CT Cervical Spine Wo Contrast Result Date: 07/11/2023 CLINICAL DATA:  Neck trauma.  Intoxicated aura tended. EXAM: CT CERVICAL SPINE WITHOUT CONTRAST TECHNIQUE: Multidetector CT imaging of the cervical spine was performed without intravenous contrast. Multiplanar CT image reconstructions were also generated. RADIATION DOSE REDUCTION: This exam was performed according to the departmental dose-optimization program which includes automated exposure control, adjustment of the mA and/or kV according to patient size and/or use of iterative reconstruction technique. COMPARISON:  CT of the cervical spine 06/09/2021 FINDINGS: Alignment: No significant listhesis is present. Straightening of the normal cervical lordosis is stable. Skull base and vertebrae: The craniocervical junction is normal. The vertebral body heights are normal. No acute or healing fractures are present. Soft tissues and spinal canal: No prevertebral fluid or swelling. No visible canal hematoma. Disc levels: A rightward disc osteophyte complex results in moderate foraminal stenosis bilaterally at C3-4, right greater than left. Broad-based disc osteophyte complex at C4-5 results in moderate central and bilateral foraminal stenosis. Uncovertebral spurring contributes to moderate foraminal stenosis bilaterally at C5-6 and C6-7. Ankylosis is present anteriorly at C5-6 and C6-7. Upper chest: Paraseptal emphysematous changes are present at the lung apices. The lungs are  otherwise clear. IMPRESSION: 1. No acute or healing fractures of the cervical spine. 2. Multilevel degenerative changes of the cervical spine as described. 3. Moderate foraminal stenosis bilaterally at C3-4, right greater than left. 4. Moderate central and bilateral foraminal stenosis at C4-5. 5. Moderate foraminal stenosis bilaterally at C5-6 and C6-7. 6. Ankylosis anteriorly at C5-6 and C6-7. Electronically Signed   By: Audree Leas M.D.   On: 07/11/2023 21:05   CT HEAD WO CONTRAST ( ) Result Date: 07/11/2023 CLINICAL DATA:  Head trauma, moderate to severe. Suicidal ideation and alcohol abuse. EXAM: CT HEAD WITHOUT CONTRAST TECHNIQUE: Contiguous axial images were obtained from the base of the skull through the vertex without intravenous contrast. RADIATION DOSE REDUCTION: This exam was performed according to the departmental dose-optimization program which includes automated exposure control, adjustment of the mA and/or kV according to patient size and/or use of iterative reconstruction technique. COMPARISON:  CT head without contrast 07/17/2022 FINDINGS: Brain: No acute infarct, hemorrhage, or mass lesion is  present. No significant white matter lesions are present. Deep brain nuclei are within normal limits. The ventricles are of normal size. No significant extraaxial fluid collection is present. The brainstem and cerebellum are within normal limits. Midline structures are within normal limits. Vascular: No hyperdense vessel or unexpected calcification. Skull: Calvarium is intact. No focal lytic or blastic lesions are present. No significant extracranial soft tissue lesion is present. Sinuses/Orbits: Mild mucosal thickening is present within an atretic right maxillary sinus. The paranasal sinuses and mastoid air cells are otherwise clear. The globes and orbits are within normal limits. IMPRESSION: 1. Normal CT appearance of the brain. No acute or focal lesion to explain the patient's symptoms. 2.  Mild mucosal thickening within an atretic right maxillary sinus. Electronically Signed   By: Audree Leas M.D.   On: 07/11/2023 21:03    Procedures Procedures    Medications Ordered in ED Medications  haloperidol  (HALDOL ) tablet 5 mg (0 mg Oral Hold 07/11/23 2148)  LORazepam  (ATIVAN ) injection 0-4 mg ( Intravenous Not Given 07/11/23 2258)    Or  LORazepam  (ATIVAN ) tablet 0-4 mg ( Oral See Alternative 07/11/23 2258)  LORazepam  (ATIVAN ) injection 0-4 mg (has no administration in time range)    Or  LORazepam  (ATIVAN ) tablet 0-4 mg (has no administration in time range)  thiamine  (VITAMIN B1) tablet 100 mg (100 mg Oral Patient Refused/Not Given 07/11/23 2307)    Or  thiamine  (VITAMIN B1) injection 100 mg ( Intravenous See Alternative 07/11/23 2307)  LORazepam  (ATIVAN ) tablet 2 mg (2 mg Oral Given 07/11/23 1939)   ED Course/ Medical Decision Making/ A&P Clinical Course as of 07/11/23 2327  Wed Jul 11, 2023  2200 Sleeping, appears comfortable  [BH]    Clinical Course User Index [BH] Megin Consalvo A, PA-C  48 year old here for evaluation of SI and hallucinations.  Recent admission for DTs requiring Precedex  and phenobarbital .  Subsequently left AMA.  Last EtOH 2 days ago, last meth use about 3 days ago.  On arrival has some auditory and visual hallucinations.  Tremors to bilateral upper extremities.  He did state he fell and hit his head the other day he cannot tell me the situation surrounding this.  He was given p.o. Ativan  and Haldol  during triage.  Orders placed for CIWA will plan to repeat labs.  Will also get imaging given fall.  Labs and imaging personally viewed and interpreted:  CBC without leukocytosis Metabolic panel mild LFT elevation suspect likely due to EtOH use CK2 23 Ethanol less than 15 CT head, cervical without significant abnormality Chest x-ray without significant abnormality  Reassessed. Sleeping. Repeat CIWA 4, still has some mild tremors with outstretched  hands. States HA resolved. No nausea. No tachycardia, HTN, low sus for DT's. Question hallucinations from his underlying unmedicated schizophrenia vs psychosis from polysubstance use  Care transferred to [facility], PA-C plan to observing to ensure does not need admission for ETOH WD. Will need TTS once medically cleared.                                 Medical Decision Making Amount and/or Complexity of Data Reviewed External Data Reviewed: labs, radiology, ECG and notes. Labs: ordered. Decision-making details documented in ED Course. Radiology: ordered and independent interpretation performed. Decision-making details documented in ED Course. ECG/medicine tests: ordered.  Risk OTC drugs. Prescription drug management. Decision regarding hospitalization. Diagnosis or treatment significantly limited by social determinants of health.  Final Clinical Impression(s) / ED Diagnoses Final diagnoses:  Paranoia (HCC)  ETOH abuse    Rx / DC Orders ED Discharge Orders     None         Keely Drennan A, PA-C 07/11/23 2330    Carin Charleston, MD 07/11/23 [phone]

## 2023-07-11 NOTE — ED Provider Triage Note (Signed)
 Emergency Medicine Provider Triage Evaluation Note  Jerry [name] , a 48 y.o. male  was evaluated in triage.  Pt complains of SI hallucinations alcohol abuse.  Review of Systems  Positive: Suicidal thoughts Negative: Pain  Physical Exam  BP 127/77 (BP Location: Right Arm)   Pulse 96   Temp 97.9 F (36.6 C) (Oral)   Resp (!) 21   SpO2 100%  Gen:   Anxious Resp:  Normal effort  MSK:   Moves extremities without difficulty Other:    Medical Decision Making  Medically screening exam initiated at 6:53 PM.  Appropriate orders placed.  Jerry [name] was informed that the remainder of the evaluation will be completed by another provider, this initial triage assessment does not replace that evaluation, and the importance of remaining in the ED until their evaluation is complete.     Lowery Rue, DO 07/11/23 [phone]

## 2023-07-12 NOTE — ED Provider Notes (Signed)
 Accepted handoff at shift change from Britni Henderly PA-C. Please see prior provider note for more detail.   Briefly: Patient is 48 y.o.   DDX: concern for Hx of shcizophrenia, etoh dependences, Dts, prior withdrawal seizures, came in today 2/2 hallucinations, tremors, etoh 2 days ago, meth 3 days ago. 2 of oral ativan . Repeat CIWA is a 4, down from around 20. Recheck CIWA around 2 hours, if not improved, may need medical admission, otherwise TTS for SI. No plan.   Plan: On reassessment patient reneges his suicidal ideation, his repeat CIWA is remaining low, around 4, his workup is overall reassuring, I think the patient is stable for discharge at this time.    Nelly Banco, PA-C 07/12/23 0120    Lindle Rhea, MD 07/12/23 [phone]

## 2023-07-13 ENCOUNTER — Other Ambulatory Visit: Payer: Self-pay

## 2023-07-13 ENCOUNTER — Emergency Department (HOSPITAL_COMMUNITY)
Admission: EM | Admit: 2023-07-13 | Discharge: 2023-07-14 | Disposition: A | Discharge status: Other Acute Inpatient Hospital | Payer: MEDICAID | Attending: Emergency Medicine | Admitting: Emergency Medicine

## 2023-07-13 ENCOUNTER — Encounter (HOSPITAL_COMMUNITY): Payer: Self-pay

## 2023-07-13 DIAGNOSIS — F419 Anxiety disorder, unspecified: Secondary | ICD-10-CM | POA: Diagnosis not present

## 2023-07-13 DIAGNOSIS — F411 Generalized anxiety disorder: Secondary | ICD-10-CM | POA: Diagnosis present

## 2023-07-13 DIAGNOSIS — Z79899 Other long term (current) drug therapy: Secondary | ICD-10-CM | POA: Diagnosis not present

## 2023-07-13 DIAGNOSIS — L03114 Cellulitis of left upper limb: Secondary | ICD-10-CM | POA: Diagnosis not present

## 2023-07-13 DIAGNOSIS — F209 Schizophrenia, unspecified: Secondary | ICD-10-CM | POA: Diagnosis not present

## 2023-07-13 DIAGNOSIS — F1910 Other psychoactive substance abuse, uncomplicated: Secondary | ICD-10-CM | POA: Diagnosis not present

## 2023-07-13 DIAGNOSIS — F22 Delusional disorders: Secondary | ICD-10-CM | POA: Diagnosis present

## 2023-07-13 DIAGNOSIS — F1990 Other psychoactive substance use, unspecified, uncomplicated: Secondary | ICD-10-CM

## 2023-07-13 LAB — COMPREHENSIVE METABOLIC PANEL WITH GFR
ALT: 45 U/L — ABNORMAL HIGH (ref 0–44)
AST: 41 U/L (ref 15–41)
Albumin: 3.3 g/dL — ABNORMAL LOW (ref 3.5–5.0)
Alkaline Phosphatase: 64 U/L (ref 38–126)
Anion gap: 9 (ref 5–15)
BUN: 12 mg/dL (ref 6–20)
CO2: 23 mmol/L (ref 22–32)
Calcium: 8.9 mg/dL (ref 8.9–10.3)
Chloride: 99 mmol/L (ref 98–111)
Creatinine, Ser: 1.06 mg/dL (ref 0.61–1.24)
GFR, Estimated: >60 mL/min (ref >60)
Glucose, Bld: 105 mg/dL — ABNORMAL HIGH (ref 70–99)
Potassium: 3.9 mmol/L (ref 3.5–5.1)
Sodium: 131 mmol/L — ABNORMAL LOW (ref 135–145)
Total Bilirubin: 0.8 mg/dL (ref 0.0–1.2)
Total Protein: 8.1 g/dL (ref 6.5–8.1)

## 2023-07-13 LAB — CBC WITH DIFFERENTIAL/PLATELET
Abs Immature Granulocytes: 0.02 10*3/uL (ref 0.00–0.07)
Basophils Absolute: 0.1 10*3/uL (ref 0.0–0.1)
Basophils Relative: 1 %
Eosinophils Absolute: 0.6 10*3/uL — ABNORMAL HIGH (ref 0.0–0.5)
Eosinophils Relative: 7 %
HCT: 37.8 % — ABNORMAL LOW (ref 39.0–52.0)
Hemoglobin: 12.6 g/dL — ABNORMAL LOW (ref 13.0–17.0)
Immature Granulocytes: 0 %
Lymphocytes Relative: 25 %
Lymphs Abs: 2.1 10*3/uL (ref 0.7–4.0)
MCH: 33.1 pg (ref 26.0–34.0)
MCHC: 33.3 g/dL (ref 30.0–36.0)
MCV: 99.2 fL (ref 80.0–100.0)
Monocytes Absolute: 1 10*3/uL (ref 0.1–1.0)
Monocytes Relative: 12 %
Neutro Abs: 4.7 10*3/uL (ref 1.7–7.7)
Neutrophils Relative %: 55 %
Platelets: 418 10*3/uL — ABNORMAL HIGH (ref 150–400)
RBC: 3.81 MIL/uL — ABNORMAL LOW (ref 4.22–5.81)
RDW: 13.2 % (ref 11.5–15.5)
WBC: 8.4 10*3/uL (ref 4.0–10.5)
nRBC: 0 % (ref 0.0–0.2)

## 2023-07-13 LAB — RAPID URINE DRUG SCREEN, HOSP PERFORMED
Amphetamines: POSITIVE — AB
Barbiturates: POSITIVE — AB
Benzodiazepines: POSITIVE — AB
Cocaine: NOT DETECTED
Opiates: NOT DETECTED
Tetrahydrocannabinol: POSITIVE — AB

## 2023-07-13 LAB — ETHANOL: Alcohol, Ethyl (B): <15 mg/dL (ref <15)

## 2023-07-13 MED ORDER — LORAZEPAM 1 MG PO TABS: 1.0000 mg | ORAL_TABLET | Freq: Two times a day (BID) | ORAL | Status: DC

## 2023-07-13 MED ORDER — LORAZEPAM 1 MG PO TABS: 0.0000 mg | ORAL_TABLET | Freq: Two times a day (BID) | ORAL | Status: DC

## 2023-07-13 MED ORDER — LORAZEPAM 1 MG PO TABS
0.0000 mg | ORAL_TABLET | Freq: Four times a day (QID) | ORAL | Status: DC
  Administered 2023-07-13 – 2023-07-14 (×2): 2 mg via ORAL
  Filled 2023-07-13 (×2): qty 2

## 2023-07-13 MED ORDER — HALOPERIDOL 5 MG PO TABS
5.0000 mg | ORAL_TABLET | Freq: Once | ORAL | Status: AC
  Administered 2023-07-13: 5 mg via ORAL
  Filled 2023-07-13: qty 1

## 2023-07-13 MED ORDER — LORAZEPAM 1 MG PO TABS
1.0000 mg | ORAL_TABLET | Freq: Once | ORAL | Status: AC
  Administered 2023-07-13: 1 mg via ORAL
  Filled 2023-07-13: qty 1

## 2023-07-13 MED ORDER — THIAMINE HCL 100 MG/ML IJ SOLN: 100.0000 mg | Freq: Every day | INTRAMUSCULAR | Status: DC

## 2023-07-13 MED ORDER — THIAMINE MONONITRATE 100 MG PO TABS
100.0000 mg | ORAL_TABLET | Freq: Every day | ORAL | Status: DC
  Administered 2023-07-13 – 2023-07-14 (×2): 100 mg via ORAL
  Filled 2023-07-13 (×2): qty 1

## 2023-07-13 MED ORDER — LORAZEPAM 1 MG PO TABS
1.0000 mg | ORAL_TABLET | Freq: Four times a day (QID) | ORAL | Status: DC
  Administered 2023-07-13: 1 mg via ORAL
  Filled 2023-07-13: qty 1

## 2023-07-13 MED ORDER — DOXYCYCLINE HYCLATE 100 MG PO TABS
100.0000 mg | ORAL_TABLET | Freq: Two times a day (BID) | ORAL | Status: DC
  Administered 2023-07-13 – 2023-07-14 (×4): 100 mg via ORAL
  Filled 2023-07-13 (×4): qty 1

## 2023-07-13 MED ORDER — HALOPERIDOL 5 MG PO TABS
5.0000 mg | ORAL_TABLET | Freq: Two times a day (BID) | ORAL | Status: DC
  Administered 2023-07-13 – 2023-07-14 (×2): 5 mg via ORAL
  Filled 2023-07-13 (×2): qty 1

## 2023-07-13 NOTE — ED Notes (Addendum)
 Pt has been changed out into purple scrubs, provided wash clothes and towels so pt can wash up  and belongings placed in labeled bag in Rm 1-4 cabinet by nurse station security has wanded the pt

## 2023-07-13 NOTE — Consult Note (Signed)
 [facility] Psychiatric Consult Initial  Patient Name: .Jerry [name]  MRN: [id number]  DOB: 11-13-1975  Consult Order details:  Orders (From admission, onward)     Start     Ordered   07/13/23 0535  CONSULT TO CALL ACT TEAM       Ordering Provider: Eldon Greenland, MD  Provider:  (Not yet assigned)  Question:  Reason for Consult?  Answer:  Psych consult   07/13/23 0535             Mode of Visit: In person    Psychiatry Consult Evaluation  Service Date: July 13, 2023 LOS:  LOS: 0 days  Chief Complaint Psychosis   Primary Psychiatric Diagnoses  Polysubstance abuse 2.   Schizophrenia  3.   Anxiety  Assessment  Jerry [name] is a 48 y.o. male admitted: Presented to the ED on 07/13/2023  4:20 AM for paranoia, and non compliant with medications for 2 months. Jerry [name] is a 48 y.o. male with medical history significant of alcohol abuse and schizophrenia history of DTs history of polysubstance abuse, GAD tobacco abuse, malingering, PTSD, tardive dyskinesia. Patient was reportedly BIB by [facility] from [facility] for paranoia. Hx of schizophrenia, non-compliant with medications x 2 months. Admits to using meth x 2 days ago which worsened symptoms.    His current presentation of hallucination, paranoia, and meth use is most consistent with possibly Schizophrenia decompensation in the setting of treatment noncompliance. Not currently on any home psychotropics. Last methamphetamine use was reported to be about 3 days ago and he presented paranoid to the hospital. On initial examination, cooperative, at times appears paranoid. Please see plan below for detailed recommendations.   Diagnoses:  Active Hospital problems: Principal Problem:   Schizophrenia (HCC) Active Problems:   Polysubstance abuse (HCC)   Generalized anxiety disorder    Plan   ## Psychiatric Medication Recommendations:  - Continue patient home medications  -- Refer to [facility] once detox is completed for dual diagnosis     ## Medical Decision Making Capacity: Not specifically addressed in this encounter   ## Further Work-up:  -- Updated EKG ordered on 07/13/23 -- Pertinent labwork reviewed earlier this admission includes: elevated LFT, NA- 131     ## Disposition:-- We recommend transfer to [facility] to [facility].   ## Behavioral / Environmental: -Delirium Precautions: Delirium Interventions for Nursing and Staff: - RN to open blinds every AM. - To Bedside: Glasses, hearing aide, and pt's own shoes. Make available to patients. when possible and encourage use. - Encourage po fluids when appropriate, keep fluids within reach. - OOB to chair with meals. - Passive ROM exercises to all extremities with AM & PM care. - RN to assess orientation to person, time and place QAM and PRN. - Recommend extended visitation hours with familiar family/friends as feasible. - Staff to minimize disturbances at night. Turn off television when pt asleep or when not in use.                ## Safety and Observation Level:  - Based on my clinical evaluation, I estimate the patient to be at high risk of self harm in the current setting. - At this time, we recommend  1:1 Observation. This decision is based on my review of the chart including patient's history and current presentation, interview of the patient, mental status examination, and consideration of suicide risk including evaluating suicidal ideation, plan, intent, suicidal or self-harm behaviors, risk factors, and  protective factors. This judgment is based on our ability to directly address suicide risk, implement suicide prevention strategies, and develop a safety plan while the patient is in the clinical setting. Please contact our team if there is a concern that risk level has changed.   CSSR Risk Category:C-SSRS RISK CATEGORY: No Risk   Suicide Risk Assessment: Patient has following modifiable risk factors for suicide: untreated depression and  medication noncompliance, substance and alcohol use which we are addressing by provision of alcohol detox and monitoring. Patient has following non-modifiable or demographic risk factors for suicide: male gender, history of suicide attempt, and psychiatric hospitalization Patient has the following protective factors against suicide: none identified   Thank you for this consult request. Recommendations have been communicated to the primary team.  We will follow up  at this time.  Chandra Come, PMHNP       History of Present Illness  Relevant Aspects of Hospital ED Course:  Admitted on 07/13/2023 for paranoia.   Patient Report:  Jerry [name], 48 y.o., male patient seen face to face by this provider, consulted with Dr. Deborah Falling; and chart reviewed on 07/13/23.  On evaluation Jerry [name] reports that he has not used meth, thc, or alcohol in 2-3 days, he could not recall. Patient denies using any barbiturates, even though his UDS was positive. BAL less than 15. Patient endorses drinking beer often, and states last week he had 2/5 Vodka. Patient is drowsy throughout the assessment. Patient endorses SI, no plan, but states he feels that people are after him. Patient states he lives off and on with his wife, but would not elaborate. He states his appetite and sleep have been poor, due to using substances. Patient begins falling asleep during assessment.   Patient has slurred and garbled speech, and behavior.  Patient denies self-harm/homicidal ideation. Patient endorses SI, no plan, also endorses AVH, with paranoia, as he feels that people are after him. Patient is drowsy throughout assessment, and demonstrating tangential speech.     Psych ROS:  Depression: Endorses Anxiety:  Endorses Mania (lifetime and current): Denies Psychosis: (lifetime and current): Endorses  Collateral information:  Contacted None: patient did not want any collateral called   Review of Systems   Psychiatric/Behavioral:  Positive for depression, hallucinations, substance abuse and suicidal ideas.      Psychiatric and Social History  Psychiatric History:  Information collected from patient, staff and chart review   Prev Dx/Sx: alcohol abuse and schizophrenia history of DTs history of polysubstance abuse, GAD tobacco abuse, malingering, PTSD, tardive dyskinesia Current Psych Provider: Dr. Ethel Henry outpatient hx, currently unknown Home Meds (current):  Previous Med Trials: risperidone   Therapy: hx therapist-Brittney Clark, currently unknown    Prior Psych Hospitalization: About 3 lifetime psychiatric hospitalizations Several lifetime episodes of suicidal ideation/attempts, including jumping in front of traffic at least once.  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 Living Situation: homeless Spiritual Hx: unknown Access to weapons/lethal means: unknown    Substance History Alcohol: alcohol dependence  Type of alcohol -unknown Last Drink- unknown Number of drinks per day -unknown History of alcohol withdrawal seizures - History of DT's -hx of DTs Tobacco: unknown Illicit drugs: marijuana every couple of weeks, cocaine about the same, meth per chart hx Prescription drug abuse: unknown Rehab hx: multiple  Exam Findings  Physical Exam:  Vital Signs:  Temp:  [98 F (36.7 C)-98.7 F (37.1 C)] 98.7 F (37.1 C) (04/25 1535) Pulse Rate:  [78-98] 78 (04/25 1535) Resp:  [16-18] 16 (04/25 1535) BP: (119-134)/(84-93) 119/90 (04/25 1535) SpO2:  [98 %-100 %] 98 % (04/25 1535) Weight:  [74.8 kg] 74.8 kg (04/25 0511) Blood pressure (!) 119/90, pulse 78, temperature 98.7 F (37.1 C), temperature source Oral, resp. rate 16, height 5\' 9"  (1.753 m), weight 74.8 kg, SpO2 98%. Body mass index is 24.37  kg/m.  Physical Exam Vitals and nursing note reviewed. Exam conducted with a chaperone present.  Psychiatric:        Attention and Perception: He is inattentive.        Mood and Affect: Affect is flat.        Speech: Speech is slurred.        Behavior: Behavior is slowed. Behavior is cooperative.        Thought Content: Thought content is paranoid. Thought content includes suicidal ideation.     Mental Status Exam: General Appearance: Disheveled  Orientation:  Other:  self and place  Memory:  Immediate;   Poor  Concentration:  Concentration: Poor  Recall:  Poor  Attention  Poor  Eye Contact:  Poor  Speech:  Slurred  Language:  Fair  Volume:  Increased  Mood: not stated  Affect:  Labile  Thought Process:  Disorganized and Descriptions of Associations: Loose  Thought Content:  Illogical, Hallucinations: Auditory Visual, and Paranoid Ideation  Suicidal Thoughts:   unable to assess  Homicidal Thoughts:  unable to assess  Judgement:  Impaired  Insight:  Lacking  Psychomotor Activity:   agitation  Akathisia:  No  Fund of Knowledge:   unable to assess       Assets:  Others:  unable to assess  Cognition:  Impaired,  Severe  ADL's:  Impaired  AIMS (if indicated):        Other History   These have been pulled in through the EMR, reviewed, and updated if appropriate.  Family History:  The patient's family history includes Cancer in his mother; Diabetes in his father and maternal grandmother; Heart disease in his father and mother; Lung cancer in his mother; Stroke in his paternal grandfather.  Medical History: Past Medical History:  Diagnosis Date   Anxiety    Bipolar 1 disorder (HCC)    Depression    Drug-induced seizure (HCC)    EtOH dependence (HCC)    Hallucination    Paranoid schizophrenia (HCC)    since pt's 20's   PTSD (post-traumatic stress disorder)     Surgical History: Past Surgical History:  Procedure Laterality Date   KIDNEY DONATION Right 2003      Medications:   Current Facility-Administered Medications:    doxycycline  (VIBRA -TABS) tablet 100 mg, 100 mg, Oral, Q12H, Eldon Greenland, MD, 100 mg at 07/13/23 1316   haloperidol  (HALDOL ) tablet 5 mg, 5 mg, Oral, BID, Eldon Greenland, MD   LORazepam  (ATIVAN ) tablet 1 mg, 1 mg, Oral, Q6H, 1 mg at 07/13/23 0550 **OR** LORazepam  (ATIVAN ) tablet 0-4 mg, 0-4 mg, Oral, Q6H, Eldon Greenland, MD   Cecily Cohen ON 07/15/2023] LORazepam  (ATIVAN ) tablet 1 mg, 1 mg, Oral, Q12H **OR** [START ON 07/15/2023] LORazepam  (ATIVAN ) tablet 0-4 mg, 0-4 mg, Oral, Q12H, Eldon Greenland, MD   thiamine  (VITAMIN B1) tablet 100 mg, 100 mg, Oral, Daily, 100 mg at 07/13/23 1315 **OR** thiamine  (VITAMIN B1) injection 100 mg, 100 mg, Intravenous, Daily, Eldon Greenland, MD No current outpatient medications on file.  Allergies: Allergies  Allergen Reactions   Penicillins Other (See Comments) and Swelling    Reaction occurred in childhood    Audry Pecina MOTLEY-MANGRUM, PMHNP

## 2023-07-13 NOTE — ED Provider Notes (Signed)
 [place] EMERGENCY DEPARTMENT AT [facility] Provider Note   CSN: [id number] Arrival date & time: 07/13/23  0413     History  Chief Complaint  Patient presents with   Alcohol Problem    Pt showing signs of alcohol withdrawal and schizophrenia.    Level 5 caveat due to psychiatric disorder Jerry [name] is a 48 y.o. male.  The history is provided by the patient. The history is limited by the condition of the patient.   Patient with extensive history including schizophrenia, alcohol use disorder presents via EMS.  Reported patient was hanging out in front of [employer] displaying signs of alcohol withdrawal and paranoia.  On arrival patient is talking to himself, but he does admit to last alcohol use was about 3 hours ago.  He also mentioned to nursing staff that he used meth 2 days ago   Past Medical History:  Diagnosis Date   Anxiety    Bipolar 1 disorder (HCC)    Depression    Drug-induced seizure (HCC)    EtOH dependence (HCC)    Hallucination    Paranoid schizophrenia (HCC)    since pt's 20's   PTSD (post-traumatic stress disorder)     Home Medications Prior to Admission medications   Not on File      Allergies    Penicillins    Review of Systems   Review of Systems  Unable to perform ROS: Psychiatric disorder    Physical Exam Updated Vital Signs BP 122/84 (BP Location: Right Arm)   Pulse 98   Temp 98 F (36.7 C) (Oral)   Resp 18   Ht 1.753 m (5\' 9" )   Wt 74.8 kg   SpO2 100%   BMI 24.37 kg/m  Physical Exam CONSTITUTIONAL: Disheveled, poor eye contact, patient talking to himself HEAD: Normocephalic/atraumatic, no visible trauma EYES: EOMI/PERRL, no nystagmus ENMT: Mucous membranes moist NECK: supple no meningeal signs CV: S1/S2 noted LUNGS: Lungs are clear to auscultation bilaterally, no apparent distress ABDOMEN: soft, nontender, NEURO: Pt is awake/alert, moves all extremitiesx4.  No facial droop.  No tremors noted EXTREMITIES:  pulses normal/equal, full ROM, no deformities SKIN: warm, color normal PSYCH: Poor eye contact, patient appears to be speaking to himself  ED Results / Procedures / Treatments   Labs (all labs ordered are listed, but only abnormal results are displayed) Labs Reviewed  CBC WITH DIFFERENTIAL/PLATELET - Abnormal; Notable for the following components:      Result Value   RBC 3.81 (*)    Hemoglobin 12.6 (*)    HCT 37.8 (*)    Platelets 418 (*)    Eosinophils Absolute 0.6 (*)    All other components within normal limits  COMPREHENSIVE METABOLIC PANEL WITH GFR - Abnormal; Notable for the following components:   Sodium 131 (*)    Glucose, Bld 105 (*)    Albumin 3.3 (*)    ALT 45 (*)    All other components within normal limits  RAPID URINE DRUG SCREEN, HOSP PERFORMED - Abnormal; Notable for the following components:   Benzodiazepines POSITIVE (*)    Amphetamines POSITIVE (*)    Tetrahydrocannabinol POSITIVE (*)    Barbiturates POSITIVE (*)    All other components within normal limits  ETHANOL    EKG None  Radiology DG Chest Portable 1 View Result Date: 07/11/2023 CLINICAL DATA:  Fall EXAM: PORTABLE CHEST 1 VIEW COMPARISON:  Chest x-ray 07/01/2023 skin is FINDINGS: The heart size and mediastinal contours are within normal limits.  Both lungs are clear. The visualized skeletal structures are unremarkable. IMPRESSION: No active disease. Electronically Signed   By: Tyron Gallon M.D.   On: 07/11/2023 21:44   CT Cervical Spine Wo Contrast Result Date: 07/11/2023 CLINICAL DATA:  Neck trauma.  Intoxicated aura tended. EXAM: CT CERVICAL SPINE WITHOUT CONTRAST TECHNIQUE: Multidetector CT imaging of the cervical spine was performed without intravenous contrast. Multiplanar CT image reconstructions were also generated. RADIATION DOSE REDUCTION: This exam was performed according to the departmental dose-optimization program which includes automated exposure control, adjustment of the mA and/or kV  according to patient size and/or use of iterative reconstruction technique. COMPARISON:  CT of the cervical spine 06/09/2021 FINDINGS: Alignment: No significant listhesis is present. Straightening of the normal cervical lordosis is stable. Skull base and vertebrae: The craniocervical junction is normal. The vertebral body heights are normal. No acute or healing fractures are present. Soft tissues and spinal canal: No prevertebral fluid or swelling. No visible canal hematoma. Disc levels: A rightward disc osteophyte complex results in moderate foraminal stenosis bilaterally at C3-4, right greater than left. Broad-based disc osteophyte complex at C4-5 results in moderate central and bilateral foraminal stenosis. Uncovertebral spurring contributes to moderate foraminal stenosis bilaterally at C5-6 and C6-7. Ankylosis is present anteriorly at C5-6 and C6-7. Upper chest: Paraseptal emphysematous changes are present at the lung apices. The lungs are otherwise clear. IMPRESSION: 1. No acute or healing fractures of the cervical spine. 2. Multilevel degenerative changes of the cervical spine as described. 3. Moderate foraminal stenosis bilaterally at C3-4, right greater than left. 4. Moderate central and bilateral foraminal stenosis at C4-5. 5. Moderate foraminal stenosis bilaterally at C5-6 and C6-7. 6. Ankylosis anteriorly at C5-6 and C6-7. Electronically Signed   By: Audree Leas M.D.   On: 07/11/2023 21:05   CT HEAD WO CONTRAST ( ) Result Date: 07/11/2023 CLINICAL DATA:  Head trauma, moderate to severe. Suicidal ideation and alcohol abuse. EXAM: CT HEAD WITHOUT CONTRAST TECHNIQUE: Contiguous axial images were obtained from the base of the skull through the vertex without intravenous contrast. RADIATION DOSE REDUCTION: This exam was performed according to the departmental dose-optimization program which includes automated exposure control, adjustment of the mA and/or kV according to patient size and/or use of  iterative reconstruction technique. COMPARISON:  CT head without contrast 07/17/2022 FINDINGS: Brain: No acute infarct, hemorrhage, or mass lesion is present. No significant white matter lesions are present. Deep brain nuclei are within normal limits. The ventricles are of normal size. No significant extraaxial fluid collection is present. The brainstem and cerebellum are within normal limits. Midline structures are within normal limits. Vascular: No hyperdense vessel or unexpected calcification. Skull: Calvarium is intact. No focal lytic or blastic lesions are present. No significant extracranial soft tissue lesion is present. Sinuses/Orbits: Mild mucosal thickening is present within an atretic right maxillary sinus. The paranasal sinuses and mastoid air cells are otherwise clear. The globes and orbits are within normal limits. IMPRESSION: 1. Normal CT appearance of the brain. No acute or focal lesion to explain the patient's symptoms. 2. Mild mucosal thickening within an atretic right maxillary sinus. Electronically Signed   By: Audree Leas M.D.   On: 07/11/2023 21:03    Procedures Procedures    Medications Ordered in ED Medications  LORazepam  (ATIVAN ) tablet 1 mg (has no administration in time range)    Or  LORazepam  (ATIVAN ) tablet 0-4 mg (has no administration in time range)  LORazepam  (ATIVAN ) tablet 1 mg (has no administration in time range)  Or  LORazepam  (ATIVAN ) tablet 0-4 mg (has no administration in time range)  thiamine  (VITAMIN B1) tablet 100 mg (has no administration in time range)    Or  thiamine  (VITAMIN B1) injection 100 mg (has no administration in time range)  haloperidol  (HALDOL ) tablet 5 mg (has no administration in time range)  doxycycline  (VIBRA -TABS) tablet 100 mg (has no administration in time range)  haloperidol  (HALDOL ) tablet 5 mg (5 mg Oral Given 07/13/23 0510)  LORazepam  (ATIVAN ) tablet 1 mg (1 mg Oral Given 07/13/23 0449)    ED Course/ Medical Decision  Making/ A&P Clinical Course as of 07/13/23 0541  Fri Jul 13, 2023  0441 Patient presented via EMS.  He was found outside of [employer] talking to himself and suspected psychosis.  Patient does have a history of alcohol withdrawal, but was just recently in the hospital and was on Precedex  [DW]  0539 Labs overall reassuring except for positive drug screen Patient still showing signs of paranoia, reports he is hearing voices of conversations saying that someone is coming to murder him.  I have reassured patient that no one is coming after him [DW]  0540 On his last hospital stay he left AGAINST MEDICAL ADVICE.  He was reported to have left arm cellulitis at that time.  On my exam he has mild erythema and 1 raised lesion on the arm, but overall is likely healing from prior episode.  Will place him on oral antibiotics while he is in the hospital.  No focal abscesses to drain.  There is no crepitus to his arm. [DW]  [phone] The patient has been placed in psychiatric observation due to the need to provide a safe environment for the patient while obtaining psychiatric consultation and evaluation, as well as ongoing medical and medication management to treat the patient's condition.  The patient has not been placed under full IVC at this time.    [DW]    Clinical Course User Index [DW] Eldon Greenland, MD                                 Medical Decision Making Amount and/or Complexity of Data Reviewed Labs: ordered.  Risk OTC drugs. Prescription drug management.   This patient presents to the ED for concern of altered mental status, this involves an extensive number of treatment options, and is a complaint that carries with it a high risk of complications and morbidity.  The differential diagnosis includes but is not limited to CVA, intracranial hemorrhage, acute coronary syndrome, renal failure, urinary tract infection, electrolyte disturbance, pneumonia, toxidrome, psychosis     Comorbidities that  complicate the patient evaluation: Patient's presentation is complicated by their history of schizophrenia  Social Determinants of Health: Patient's  frequent ER visits, substance use disorder   increases the complexity of managing their presentation  Additional history obtained: Records reviewed previous admission documents   Medicines ordered and prescription drug management: I ordered medication including haldol   for paranoia  Reevaluation of the patient after these medicines showed that the patient    stayed the same   Reevaluation: After the interventions noted above, I reevaluated the patient and found that they have :stayed the same  Complexity of problems addressed: Patient's presentation is most consistent with  acute presentation with potential threat to life or bodily function  At this time patient is safe for psychiatric evaluation.  At this time I have low suspicion for significant  alcohol withdrawal.  Patient still displaying signs of paranoia.         Final Clinical Impression(s) / ED Diagnoses Final diagnoses:  Paranoia (HCC)  Substance use disorder  Cellulitis of left upper extremity    Rx / DC Orders ED Discharge Orders     None         Eldon Greenland, MD 07/13/23 [phone]

## 2023-07-13 NOTE — ED Provider Notes (Addendum)
 NP attempted to assess face to face- patient is difficult to arouse, RN reported patient had Ativan , and Haldol - will follow-up when patient is able to participate in assessment. - Recruitment consultant at bedside.  Asked Jerry [name], EMT to message me when patient woke up

## 2023-07-13 NOTE — ED Notes (Signed)
 Pt is constantly worried that someone is going to come in and kill him. All staff are reassuring pt that no one is going to kill him. At this time, pt is starting to feel the effects of the lorazepam  and haloperidol . Possibly going to try and relax and fall asleep.

## 2023-07-13 NOTE — ED Notes (Signed)
 Patient to room 35. Patient ambulated to room .   Patient calm and drowsy.   Patient oriented to unit and room.

## 2023-07-13 NOTE — ED Triage Notes (Signed)
 Pt picked up in front of a [employer] showing signs of alcohol withdrawal and schizophrenia per EMS and the police. Non-compliant with meds x 2 months and admits to using meth x 2 days ago.

## 2023-07-13 NOTE — ED Notes (Signed)
 RN to MD concerning CIWA score of 26. MD states that he does not think this is alcohol related but to give the patient 1 more mg of Lorazepam . RN repeated orders back to MD to clarify. 1mg  of Lorazepam  given.

## 2023-07-14 ENCOUNTER — Other Ambulatory Visit: Payer: Self-pay

## 2023-07-14 ENCOUNTER — Encounter (HOSPITAL_COMMUNITY): Payer: Self-pay | Admitting: Psychiatry

## 2023-07-14 ENCOUNTER — Inpatient Hospital Stay (HOSPITAL_COMMUNITY)
Admission: AD | Admit: 2023-07-14 | Discharge: 2023-07-19 | DRG: 885 | Disposition: A | Discharge status: Home / Self Care | Payer: MEDICAID | Source: Intra-hospital | Attending: Psychiatry | Admitting: Psychiatry

## 2023-07-14 DIAGNOSIS — F209 Schizophrenia, unspecified: Principal | ICD-10-CM | POA: Diagnosis present

## 2023-07-14 DIAGNOSIS — Z560 Unemployment, unspecified: Secondary | ICD-10-CM

## 2023-07-14 DIAGNOSIS — Z801 Family history of malignant neoplasm of trachea, bronchus and lung: Secondary | ICD-10-CM | POA: Diagnosis not present

## 2023-07-14 DIAGNOSIS — F1510 Other stimulant abuse, uncomplicated: Secondary | ICD-10-CM | POA: Diagnosis present

## 2023-07-14 DIAGNOSIS — Z5982 Transportation insecurity: Secondary | ICD-10-CM | POA: Diagnosis not present

## 2023-07-14 DIAGNOSIS — F1410 Cocaine abuse, uncomplicated: Secondary | ICD-10-CM | POA: Diagnosis present

## 2023-07-14 DIAGNOSIS — F17210 Nicotine dependence, cigarettes, uncomplicated: Secondary | ICD-10-CM | POA: Diagnosis present

## 2023-07-14 DIAGNOSIS — G4700 Insomnia, unspecified: Secondary | ICD-10-CM | POA: Diagnosis present

## 2023-07-14 DIAGNOSIS — Z79899 Other long term (current) drug therapy: Secondary | ICD-10-CM

## 2023-07-14 DIAGNOSIS — R45851 Suicidal ideations: Secondary | ICD-10-CM | POA: Diagnosis present

## 2023-07-14 DIAGNOSIS — Z8249 Family history of ischemic heart disease and other diseases of the circulatory system: Secondary | ICD-10-CM | POA: Diagnosis not present

## 2023-07-14 DIAGNOSIS — F4310 Post-traumatic stress disorder, unspecified: Secondary | ICD-10-CM | POA: Diagnosis present

## 2023-07-14 DIAGNOSIS — Z833 Family history of diabetes mellitus: Secondary | ICD-10-CM | POA: Diagnosis not present

## 2023-07-14 DIAGNOSIS — Z604 Social exclusion and rejection: Secondary | ICD-10-CM | POA: Diagnosis present

## 2023-07-14 DIAGNOSIS — Z823 Family history of stroke: Secondary | ICD-10-CM | POA: Diagnosis not present

## 2023-07-14 DIAGNOSIS — F1110 Opioid abuse, uncomplicated: Secondary | ICD-10-CM | POA: Diagnosis present

## 2023-07-14 DIAGNOSIS — F1210 Cannabis abuse, uncomplicated: Secondary | ICD-10-CM | POA: Diagnosis present

## 2023-07-14 DIAGNOSIS — F200 Paranoid schizophrenia: Secondary | ICD-10-CM | POA: Diagnosis not present

## 2023-07-14 DIAGNOSIS — Z880 Allergy status to penicillin: Secondary | ICD-10-CM

## 2023-07-14 DIAGNOSIS — Z5900 Homelessness unspecified: Secondary | ICD-10-CM

## 2023-07-14 DIAGNOSIS — F22 Delusional disorders: Secondary | ICD-10-CM | POA: Diagnosis not present

## 2023-07-14 MED ORDER — DIPHENHYDRAMINE HCL 50 MG/ML IJ SOLN: 50.0000 mg | Freq: Three times a day (TID) | INTRAMUSCULAR | Status: DC | PRN

## 2023-07-14 MED ORDER — DOXYCYCLINE HYCLATE 100 MG PO TABS
100.0000 mg | ORAL_TABLET | Freq: Two times a day (BID) | ORAL | Status: DC
  Administered 2023-07-14 – 2023-07-19 (×9): 100 mg via ORAL
  Filled 2023-07-14 (×15): qty 1

## 2023-07-14 MED ORDER — DIPHENHYDRAMINE HCL 25 MG PO CAPS
50.0000 mg | ORAL_CAPSULE | Freq: Three times a day (TID) | ORAL | Status: DC | PRN
  Administered 2023-07-18: 50 mg via ORAL
  Filled 2023-07-14: qty 2

## 2023-07-14 MED ORDER — LORAZEPAM 2 MG/ML IJ SOLN: 2.0000 mg | Freq: Three times a day (TID) | INTRAMUSCULAR | Status: DC | PRN

## 2023-07-14 MED ORDER — HALOPERIDOL 5 MG PO TABS
5.0000 mg | ORAL_TABLET | Freq: Three times a day (TID) | ORAL | Status: DC | PRN
  Administered 2023-07-18: 5 mg via ORAL
  Filled 2023-07-14: qty 1

## 2023-07-14 MED ORDER — DOXYCYCLINE HYCLATE 100 MG PO TABS: 100.0000 mg | ORAL_TABLET | Freq: Two times a day (BID) | ORAL | Status: DC

## 2023-07-14 MED ORDER — LORAZEPAM 1 MG PO TABS
1.0000 mg | ORAL_TABLET | Freq: Four times a day (QID) | ORAL | Status: AC
  Filled 2023-07-14: qty 1

## 2023-07-14 MED ORDER — HYDROXYZINE HCL 25 MG PO TABS
25.0000 mg | ORAL_TABLET | Freq: Three times a day (TID) | ORAL | Status: DC | PRN
  Administered 2023-07-15 – 2023-07-18 (×5): 25 mg via ORAL
  Filled 2023-07-14 (×4): qty 1

## 2023-07-14 MED ORDER — ALUM & MAG HYDROXIDE-SIMETH 200-200-20 MG/5ML PO SUSP: 30.0000 mL | ORAL | Status: DC | PRN

## 2023-07-14 MED ORDER — VITAMIN B-1 100 MG PO TABS
100.0000 mg | ORAL_TABLET | Freq: Every day | ORAL | Status: DC
  Administered 2023-07-15 – 2023-07-19 (×5): 100 mg via ORAL
  Filled 2023-07-14 (×8): qty 1

## 2023-07-14 MED ORDER — HALOPERIDOL 5 MG PO TABS
5.0000 mg | ORAL_TABLET | Freq: Two times a day (BID) | ORAL | Status: DC
  Administered 2023-07-14 – 2023-07-19 (×10): 5 mg via ORAL
  Filled 2023-07-14 (×16): qty 1

## 2023-07-14 MED ORDER — THIAMINE HCL 100 MG/ML IJ SOLN: 100.0000 mg | Freq: Every day | INTRAMUSCULAR | Status: DC

## 2023-07-14 MED ORDER — MAGNESIUM HYDROXIDE 400 MG/5ML PO SUSP: 30.0000 mL | Freq: Every day | ORAL | Status: DC | PRN

## 2023-07-14 MED ORDER — HALOPERIDOL LACTATE 5 MG/ML IJ SOLN: 5.0000 mg | Freq: Three times a day (TID) | INTRAMUSCULAR | Status: DC | PRN

## 2023-07-14 MED ORDER — LORAZEPAM 1 MG PO TABS
0.0000 mg | ORAL_TABLET | Freq: Four times a day (QID) | ORAL | Status: AC
  Administered 2023-07-14 – 2023-07-15 (×2): 2 mg via ORAL
  Filled 2023-07-14 (×2): qty 2

## 2023-07-14 MED ORDER — HALOPERIDOL LACTATE 5 MG/ML IJ SOLN: 10.0000 mg | Freq: Three times a day (TID) | INTRAMUSCULAR | Status: DC | PRN

## 2023-07-14 MED ORDER — ACETAMINOPHEN 325 MG PO TABS: 650.0000 mg | ORAL_TABLET | Freq: Four times a day (QID) | ORAL | Status: DC | PRN

## 2023-07-14 MED ORDER — ENSURE ENLIVE PO LIQD
237.0000 mL | Freq: Two times a day (BID) | ORAL | Status: DC
  Administered 2023-07-15 – 2023-07-19 (×7): 237 mL via ORAL
  Filled 2023-07-14 (×11): qty 237

## 2023-07-14 NOTE — Group Note (Signed)
 Date:  07/14/2023 Time:  9:07 PM  Group Topic/Focus:  Wrap-Up Group:   The focus of this group is to help patients review their daily goal of treatment and discuss progress on daily workbooks.    Participation Level:  Did Not Attend  Participation Quality:   n/a  Affect:   n/a  Cognitive:   n/a  Insight: None  Engagement in Group:   n/a  Modes of Intervention:   n/a  Additional Comments:  Patient did not attend wrap up group.  Dillard Frame 07/14/2023, 9:07 PM

## 2023-07-14 NOTE — Progress Notes (Signed)
 Patient is a 48 year old male who presented under voluntary admission from [facility] due complaints of alcohol/substance abuse and paranoia. Pt has a hx of alcohol abuse, DT's, schizophrenia, and PTSD. Pt reported being off meds for a couple months, had been drinking two fifths a day, and stated he has been using "about every drug except for heroin". Pt presented disheveled, speech somewhat garbled at times, scored 11 on CIWA (sweats, tremors, headache, agitation, pins and needles, mild agitation) was cooperative during assessment, but interview was abbreviated due to pt's discomfort. .VS monitored and recorded. Skin check performed. Belongings searched and secured in locker. Patient was oriented to unit and schedule. Pt endorses passive SI no plan- and AH , but denies VH . Meal tray and po fluids provided. Q 15 min checks initiated for safety.

## 2023-07-14 NOTE — ED Provider Notes (Signed)
 Emergency Medicine Observation Re-evaluation Note  Jerry [name] is a 48 y.o. male, seen on rounds today.  Pt initially presented to the ED for complaints of Alcohol Problem (Pt showing signs of alcohol withdrawal and schizophrenia. ) Currently, the patient is sleeping.  Patient had left hospital AMA on 07/06/23 for etoh withddrawal and hallucinations and recommended for [name] admission after detox, but left hospital AMA.  Physical Exam  BP 110/82 (BP Location: Left Arm)   Pulse 76   Temp 98.1 F (36.7 C) (Oral)   Resp 18   Ht 5\' 9"  (1.753 m)   Wt 74.8 kg   SpO2 98%   BMI 24.37 kg/m  Physical Exam General: NAD Cardiac: Regular HR Lungs: No resp distress   ED Course / MDM  EKG:   I have reviewed the labs performed to date as well as medications administered while in observation.    Plan  Current plan is for [name] re-evaluation - pt on CIWA protocol    Keashia [name], Jerry Me, MD 07/14/23 1046

## 2023-07-14 NOTE — Tx Team (Signed)
 Initial Treatment Plan 07/14/2023 6:04 PM [name] ZOX:[id number]    PATIENT STRESSORS: Financial difficulties   Substance abuse     PATIENT STRENGTHS: Capable of independent living  Communication skills  Motivation for treatment/growth    PATIENT IDENTIFIED PROBLEMS: Alcohol/ substance abuse    Paranoia    Homelessnes             DISCHARGE CRITERIA:  Improved stabilization in mood, thinking, and/or behavior Need for constant or close observation no longer present Reduction of life-threatening or endangering symptoms to within safe limits Verbal commitment to aftercare and medication compliance Withdrawal symptoms are absent or subacute and managed without 24-hour nursing intervention  PRELIMINARY DISCHARGE PLAN: Attend 12-step recovery group Outpatient therapy  PATIENT/FAMILY INVOLVEMENT: This treatment plan has been presented to and reviewed with the patient, Jerry [name],   The patient has been given the opportunity to ask questions and make suggestions.  Victorine Grater, RN 07/14/2023, 6:04 PM

## 2023-07-14 NOTE — ED Notes (Signed)
 Patient has been alert. Patient ambulatory .  Patient cooperative. Patient medication compliant.  Patient mostly sleeping this shift.  No suicidal ideation or homicidal ideation noted. No paranoia or delusion noted at this time.

## 2023-07-14 NOTE — ED Notes (Signed)
 Patient off unit to [facility] per provider. Patient alert, cooperative and no s/s of distress at this time. Discharge information and belongings given to Tree surgeon for facility. Patient ambulatory off unit, escorted by RN. Patient transported by [employer].

## 2023-07-14 NOTE — Plan of Care (Signed)
   Problem: Education: Goal: Emotional status will improve Outcome: Progressing Goal: Mental status will improve Outcome: Progressing

## 2023-07-14 NOTE — ED Notes (Signed)
 Pt given lunch tray.

## 2023-07-14 NOTE — Progress Notes (Signed)
   07/14/23 2005  Psych Admission Type (Psych Patients Only)  Admission Status Voluntary  Psychosocial Assessment  Patient Complaints None  Eye Contact Avoids  Facial Expression Flat  Affect Appropriate to circumstance  Speech Soft  Interaction Isolative  Motor Activity Slow  Appearance/Hygiene Disheveled;Poor hygiene  Behavior Characteristics Appropriate to situation  Mood Depressed  Thought Process  Coherency WDL  Content Paranoia  Delusions Paranoid  Perception Hallucinations  Hallucination Auditory  Judgment Poor  Danger to Self  Current suicidal ideation? Passive  Description of Suicide Plan no plan  Agreement Not to Harm Self Yes  Description of Agreement verbally

## 2023-07-14 NOTE — Plan of Care (Signed)
  Problem: Safety: Goal: Periods of time without injury will increase Outcome: Progressing   Problem: Health Behavior/Discharge Planning: Goal: Compliance with treatment plan for underlying cause of condition will improve Outcome: Progressing

## 2023-07-14 NOTE — Progress Notes (Signed)
 [facility] LCSW Progress Note   07/14/2023    11:17 AM  Jerry [name]   [id number]   Type of Contact and Topic:  Psychiatric Bed Placement   Pt accepted to [facility] 303-1    Patient meets inpatient criteria per Chandra Come, PMHNP    The attending provider will be Dr. Thirza Fleet   Call report to [phone]    Majorie Scrape, RN @ [facility] ED notified.     Pt scheduled  to arrive at [facility] TODAY.    Phares Brasher, MSW, LCSW-A  11:18 AM 07/14/2023

## 2023-07-15 DIAGNOSIS — F200 Paranoid schizophrenia: Secondary | ICD-10-CM

## 2023-07-15 MED ORDER — LORAZEPAM 1 MG PO TABS
1.0000 mg | ORAL_TABLET | Freq: Two times a day (BID) | ORAL | Status: AC
  Administered 2023-07-15 – 2023-07-16 (×3): 1 mg via ORAL
  Filled 2023-07-15 (×4): qty 1

## 2023-07-15 MED ORDER — LORAZEPAM 1 MG PO TABS: 1.0000 mg | ORAL_TABLET | Freq: Four times a day (QID) | ORAL | Status: AC | PRN

## 2023-07-15 MED ORDER — ONDANSETRON 4 MG PO TBDP: 4.0000 mg | ORAL_TABLET | Freq: Four times a day (QID) | ORAL | Status: AC | PRN

## 2023-07-15 MED ORDER — LORAZEPAM 0.5 MG PO TABS
0.5000 mg | ORAL_TABLET | Freq: Every day | ORAL | Status: AC
  Administered 2023-07-18: 0.5 mg via ORAL
  Filled 2023-07-15: qty 1

## 2023-07-15 MED ORDER — LOPERAMIDE HCL 2 MG PO CAPS: 2.0000 mg | ORAL_CAPSULE | ORAL | Status: AC | PRN

## 2023-07-15 MED ORDER — LORAZEPAM 1 MG PO TABS
1.0000 mg | ORAL_TABLET | Freq: Every day | ORAL | Status: AC
  Administered 2023-07-17: 1 mg via ORAL

## 2023-07-15 NOTE — Group Note (Signed)
 Date:  07/15/2023 Time:  2:13 PM  Group Topic/Focus:  Developing a Wellness Toolbox:   The focus of this group is to help patients develop a "wellness toolbox" with skills and strategies to promote recovery upon discharge.    Participation Level:  Active  Participation Quality:  Appropriate  Affect:  Appropriate  Ellan Gunner 07/15/2023, 2:13 PM

## 2023-07-15 NOTE — Progress Notes (Signed)
   07/15/23 0900  Psych Admission Type (Psych Patients Only)  Admission Status Voluntary  Psychosocial Assessment  Patient Complaints None  Eye Contact Avoids  Facial Expression Flat  Affect Appropriate to circumstance  Speech Soft  Interaction Isolative  Motor Activity Slow  Appearance/Hygiene Disheveled  Behavior Characteristics Appropriate to situation  Mood Depressed  Thought Process  Coherency WDL  Content Paranoia  Delusions None reported or observed  Perception Hallucinations  Hallucination None reported or observed  Judgment Poor  Confusion Mild  Danger to Self  Current suicidal ideation? Denies  Agreement Not to Harm Self Yes  Description of Agreement verbal  Danger to Others  Danger to Others None reported or observed

## 2023-07-15 NOTE — Plan of Care (Signed)
   Problem: Coping: Goal: Ability to verbalize frustrations and anger appropriately will improve Outcome: Progressing   Problem: Safety: Goal: Periods of time without injury will increase Outcome: Progressing

## 2023-07-15 NOTE — H&P (Addendum)
 Psychiatric Admission Assessment Adult  Patient Identification: Jerry [name] MRN:  [id number] Date of Evaluation:  07/15/2023 Chief Complaint:  Schizophrenia (HCC) [F20.9] Principal Diagnosis: Schizophrenia (HCC) Diagnosis:  Principal Problem:   Schizophrenia (HCC)  History of Present Illness:  Jerry [name] is a 49 yr old male who presented on 4/23 to [facility] with complaints of paranoia, he was admitted to [facility] on 4/27.  PPHx is significant for Schizophrenia, Depression, PTSD, and Polysubstance Abuse (EtOH, Cocaine, Meth, THC, Barbiturates), and multiple Psychiatric Hospitalizations (last [facility] 06/2023).  He reports that he was hospitalized due to people chasing him and hearing voices.  He reports that this has been happening for about a month and that it has been getting worse.  He reports that he called the ambulance to take him to the hospital because he knew he needed help.  He reports past psychiatric history significant for paranoid schizophrenia, PTSD, depression, and polysubstance abuse.  He reports no history of suicide attempts.  He reports no history of self-injurious behavior.  He reports multiple prior psychiatric hospitalizations-last [facility]/2025.  He reports no significant past medical history.  He reports a past surgical history as having 4 kidney donation 2003.  He reports no history of head trauma.  He reports a history of withdrawal seizures the last 1 being about 2 years ago.  He reports an allergy to penicillin.  He is currently unhoused.  He reports he is currently unemployed.  He reports drinking about 2/5 of liquor a day prior to attempting to cut back.  He reports he has had DTs and seizures in the past.  He reports he does smoke cigarettes.  When asked about substance use he reports "been on everything but heroin."  He reports he does have pending charges but does not know what they are.  He reports no access to firearms.  When asked what medications he was on the past he  reports he does not remember but he is willing to continue them.  He reports no other concerns at present.   Associated Signs/Symptoms: Depression Symptoms:  depressed mood, anhedonia, insomnia, fatigue, suicidal thoughts without plan, anxiety, loss of energy/fatigue, disturbed sleep, (Hypo) Manic Symptoms:   Reports None Anxiety Symptoms:  Excessive Worry, Psychotic Symptoms:  Hallucinations: Auditory PTSD Symptoms: Occasional flashbacks and nightmares Total Time spent with patient: 1 hour  Past Psychiatric History:  Schizophrenia, Depression, PTSD, and Polysubstance Abuse (EtOH, Cocaine, Meth, THC, Barbiturates), and multiple Psychiatric Hospitalizations (last [facility] 06/2023).  Is the patient at risk to self? No.  Has the patient been a risk to self in the past 6 months? No.  Has the patient been a risk to self within the distant past? No.  Is the patient a risk to others? No.  Has the patient been a risk to others in the past 6 months? No.  Has the patient been a risk to others within the distant past? No.   [place] Scale:  Flowsheet Row Ad[facility] (Current) [facility] from 07/13/2023 in [facility] Department at [facility] ED from 07/11/2023 in [facility] Emergency Department at [facility]  C-SSRS RISK CATEGORY Low Risk Error: Q7 should not be populated when Q6 is No High Risk        Prior Inpatient Therapy: Yes.   If yes, describe [facility] 06/2023  Prior Outpatient Therapy: No. If yes, describe N/A   Alcohol Screening:   Substance Abuse History in the last  12 months:  Yes.   Consequences of Substance Abuse: Medical Consequences:  Lead to hospitalizations Previous Psychotropic Medications: Yes  Risperdal , Gabapentin , Haldol  Psychological Evaluations: No  Past Medical History:  Past Medical History:  Diagnosis Date   Anxiety    Bipolar 1 disorder (HCC)    Depression    Drug-induced  seizure (HCC)    EtOH dependence (HCC)    Hallucination    Paranoid schizophrenia (HCC)    since pt's 20's   PTSD (post-traumatic stress disorder)     Past Surgical History:  Procedure Laterality Date   KIDNEY DONATION Right 2003   Family History:  Family History  Problem Relation Age of Onset   Heart disease Mother    Cancer Mother    Lung cancer Mother    Heart disease Father    Diabetes Father    Diabetes Maternal Grandmother    Stroke Paternal Grandfather    Family Psychiatric  History:  Multiple Members- EtOH and Polysubstance Abuse No Known Diagnosis' or Suicides   Tobacco Screening:  Social History   Tobacco Use  Smoking Status Every Day   Current packs/day: 1.00   Types: Cigarettes  Smokeless Tobacco Never    BH Tobacco Counseling     Are you interested in Tobacco Cessation Medications?  Yes, implement Nicotene Replacement Protocol Counseled patient on smoking cessation:  Refused/Declined practical counseling Reason Tobacco Screening Not Completed: No value filed.       Social History:  Social History   Substance and Sexual Activity  Alcohol Use Yes   Comment: Decreased from 12 cans of beer and 20 shots of liquor about 2 months ago     Social History   Substance and Sexual Activity  Drug Use Yes   Types: Cocaine, Heroin, Amphetamines, Methamphetamines, Marijuana   Comment: last drug use a few days ago    Additional Social History:                           Allergies:   Allergies  Allergen Reactions   Penicillins Other (See Comments) and Swelling    Reaction occurred in childhood   Lab Results: No results found for this or any previous visit (from the past 48 hours).  Blood Alcohol level:  Lab Results  Component Value Date   [facility] <15 07/13/2023   ETH <15 07/11/2023    Metabolic Disorder Labs:  Lab Results  Component Value Date   HGBA1C 5.3 07/09/2022   MPG 105.41 07/09/2022   MPG 111.15 10/31/2020   No results found  for: "PROLACTIN" Lab Results  Component Value Date   CHOL 162 07/09/2022   TRIG 86 07/09/2022   HDL 67 07/09/2022   CHOLHDL 2.4 07/09/2022   VLDL 17 07/09/2022   LDLCALC 78 07/09/2022   LDLCALC 82 10/31/2020    Current Medications: Current Facility-Administered Medications  Medication Dose Route Frequency Provider Last Rate Last Admin   acetaminophen  (TYLENOL ) tablet 650 mg  650 mg Oral Q6H PRN Motley-Mangrum, Jadeka A, PMHNP       alum & mag hydroxide-simeth (MAALOX/MYLANTA) 200-200-20 MG/5ML suspension 30 mL  30 mL Oral Q4H PRN Motley-Mangrum, Jadeka A, PMHNP       haloperidol  (HALDOL ) tablet 5 mg  5 mg Oral TID PRN Motley-Mangrum, Jadeka A, PMHNP       And   diphenhydrAMINE (BENADRYL) capsule 50 mg  50 mg Oral TID PRN Motley-Mangrum, Jadeka A, PMHNP       haloperidol   lactate (HALDOL ) injection 5 mg  5 mg Intramuscular TID PRN Motley-Mangrum, Jadeka A, PMHNP       And   diphenhydrAMINE (BENADRYL) injection 50 mg  50 mg Intramuscular TID PRN Motley-Mangrum, Jadeka A, PMHNP       And   LORazepam  (ATIVAN ) injection 2 mg  2 mg Intramuscular TID PRN Motley-Mangrum, Jadeka A, PMHNP       haloperidol  lactate (HALDOL ) injection 10 mg  10 mg Intramuscular TID PRN Motley-Mangrum, Jadeka A, PMHNP       And   diphenhydrAMINE (BENADRYL) injection 50 mg  50 mg Intramuscular TID PRN Motley-Mangrum, Jadeka A, PMHNP       And   LORazepam  (ATIVAN ) injection 2 mg  2 mg Intramuscular TID PRN Motley-Mangrum, Jadeka A, PMHNP       doxycycline  (VIBRA -TABS) tablet 100 mg  100 mg Oral Q12H Motley-Mangrum, Jadeka A, PMHNP   100 mg at 07/15/23 4098   feeding supplement (ENSURE ENLIVE / ENSURE PLUS) liquid 237 mL  237 mL Oral BID BM Zouev, Dmitri, MD   237 mL at 07/15/23 0940   haloperidol  (HALDOL ) tablet 5 mg  5 mg Oral BID Motley-Mangrum, Jadeka A, PMHNP   5 mg at 07/15/23 0825   hydrOXYzine  (ATARAX ) tablet 25 mg  25 mg Oral TID PRN Motley-Mangrum, Jadeka A, PMHNP       loperamide  (IMODIUM ) capsule 2-4 mg   2-4 mg Oral PRN Margart Zemanek, Knute Perla, MD       LORazepam  (ATIVAN ) tablet 1 mg  1 mg Oral Q6H Motley-Mangrum, Jadeka A, PMHNP       Or   LORazepam  (ATIVAN ) tablet 0-4 mg  0-4 mg Oral Q6H Motley-Mangrum, Jadeka A, PMHNP   2 mg at 07/15/23 1191   LORazepam  (ATIVAN ) tablet 1 mg  1 mg Oral BID Jimmye Wisnieski S, MD       Followed by   Cecily Cohen ON 07/17/2023] LORazepam  (ATIVAN ) tablet 1 mg  1 mg Oral Q0600 Breean Nannini S, MD       Followed by   Cecily Cohen ON 07/18/2023] LORazepam  (ATIVAN ) tablet 0.5 mg  0.5 mg Oral Q0600 Masiel Gentzler, Knute Perla, MD       LORazepam  (ATIVAN ) tablet 1 mg  1 mg Oral Q6H PRN Quinlynn Cuthbert S, MD       magnesium  hydroxide (MILK OF MAGNESIA) suspension 30 mL  30 mL Oral Daily PRN Motley-Mangrum, Jadeka A, PMHNP       ondansetron  (ZOFRAN -ODT) disintegrating tablet 4 mg  4 mg Oral Q6H PRN Dayanara Sherrill S, MD       thiamine  (Vitamin B-1) tablet 100 mg  100 mg Oral Daily Motley-Mangrum, Jadeka A, PMHNP   100 mg at 07/15/23 4782   Or   thiamine  (VITAMIN B1) injection 100 mg  100 mg Intravenous Daily Motley-Mangrum, Jadeka A, PMHNP       PTA Medications: No medications prior to admission.    Musculoskeletal: Strength & Muscle Tone: within normal limits Gait & Station:  laying in bed Patient leans: N/A            Psychiatric Specialty Exam:  Presentation  General Appearance:  Disheveled  Eye Contact: None  Speech: Garbled; Slurred  Speech Volume: Decreased  Handedness: Right   Mood and Affect  Mood: Depressed  Affect: Flat; Constricted; Depressed   Thought Process  Thought Processes: Coherent  Duration of Psychotic Symptoms: 1 month Past Diagnosis of Schizophrenia or Psychoactive disorder: Yes  Descriptions of Associations:Intact  Orientation:Partial  Thought Content:WDL  Hallucinations:Hallucinations: Auditory Description of Auditory Hallucinations: voices  Ideas of Reference:Paranoia  Suicidal  Thoughts:Suicidal Thoughts: No  Homicidal Thoughts:Homicidal Thoughts: No   Sensorium  Memory: Immediate Poor  Judgment: Impaired  Insight: Present   Executive Functions  Concentration: Poor  Attention Span: Poor  Recall: Poor  Fund of Knowledge: Poor  Language: Fair   Psychomotor Activity  Psychomotor Activity:Psychomotor Activity: Decreased   Assets  Assets: Communication Skills; Social Support   Sleep  Sleep:Sleep: Poor    Physical Exam: Physical Exam Vitals and nursing note reviewed.  Constitutional:      General: He is not in acute distress.    Appearance: He is normal weight. He is not ill-appearing or toxic-appearing.  HENT:     Head: Normocephalic and atraumatic.  Pulmonary:     Effort: Pulmonary effort is normal.    Review of Systems  Constitutional:  Positive for diaphoresis and malaise/fatigue.  Respiratory:  Negative for cough and shortness of breath.   Cardiovascular:  Negative for chest pain.  Gastrointestinal:  Negative for abdominal pain, constipation, diarrhea, nausea and vomiting.  Neurological:  Positive for tremors and headaches. Negative for dizziness and weakness.  Psychiatric/Behavioral:  Positive for depression, hallucinations (AH) and substance abuse. Negative for suicidal ideas. The patient is nervous/anxious.    Blood pressure 107/89, pulse 91, temperature 98.1 F (36.7 C), temperature source Oral, resp. rate 16, height 5\' 9"  (1.753 m), weight 71.7 kg, SpO2 98%. Body mass index is 23.33 kg/m.  Treatment Plan Summary: Daily contact with patient to assess and evaluate symptoms and progress in treatment and Medication management  Hampton Rhew is a 48 yr old male who presented on 4/23 to [facility] with complaints of paranoia, he was admitted to [facility] on 4/27.  PPHx is significant for Schizophrenia, Depression, PTSD, and Polysubstance Abuse (EtOH, Cocaine, Meth, THC, Barbiturates), and multiple Psychiatric Hospitalizations  (last [facility] 06/2023).   Michaeel has a well documented history of Schizophrenia and substance abuse. He is currently disoriented and unable to fully interview.  As he was already started on Haldol  we will continue with this.   We will continue with the CIWA and PRN Ativan  given his continued elevated CIWA.  Given his history of withdrawal seizures we will also order a quick taper of ativan .   Schizophrenia: -Continue Haldol  5 mg BID for psychosis -Will consider starting antidepressant once more stable -Continue Agitation Protocol: Haldol /Ativan Harlee Lichtenstein   Withdrawal: -Continue CIWA, last score @CIWA @ -Continue Ativan  1 mg q6 PRN CIWA>10 -Start Ativan  taper to end 4/30 -Continue Imodium  2-4 mg PRN diarrhea -Continue Zofran -ODT 4 mg q6 PRN nausea -Continue Thiamine  100 mg daily for nutritional supplementation   -Continue Doxycycline  100 mg BID -Continue Ensure 237 mL BID -Continue PRN's: Tylenol , Maalox, Atarax , Milk of Magnesia    Observation Level/Precautions:  15 minute checks  Laboratory:  CMP:WNL except Na: 131,  Albu: 3.3, ALT: 45,  CBC: WNL except RBC: 3.81,  Hem: 12.6,  HCT: 37.8,  Plat: 418,  Eosin Abs: 0.6,  UDS: Benzo, Amphet, THC, Barbiturates positive,  EKG: Sinus Rhythm w/ marked Sinus Arrhythmia w/ Qtc: 481.  Psychotherapy:    Medications:  Haldol , Ativan   Consultations:    Discharge Concerns:    Estimated LOS: 5-7 days  Other:     Physician Treatment Plan for Primary Diagnosis: Schizophrenia (HCC) Long Term Goal(s): Improvement in symptoms so as ready for discharge  Short Term Goals: Ability to identify changes in lifestyle to reduce recurrence of condition will improve, Ability to verbalize  feelings will improve, Ability to demonstrate self-control will improve, Ability to identify and develop effective coping behaviors will improve, Ability to maintain clinical measurements within normal limits will improve, and Ability to identify triggers associated with  substance abuse/mental health issues will improve  Physician Treatment Plan for Secondary Diagnosis: Principal Problem:   Schizophrenia (HCC)  Long Term Goal(s): Improvement in symptoms so as ready for discharge  Short Term Goals: Ability to identify changes in lifestyle to reduce recurrence of condition will improve, Ability to verbalize feelings will improve, Ability to demonstrate self-control will improve, Ability to identify and develop effective coping behaviors will improve, Ability to maintain clinical measurements within normal limits will improve, and Ability to identify triggers associated with substance abuse/mental health issues will improve  I certify that inpatient services furnished can reasonably be expected to improve the patient's condition.    Basilia Bosworth, MD 4/27/202511:37 AM

## 2023-07-15 NOTE — Group Note (Signed)
 Date:  07/15/2023 Time:  8:47 AM  Group Topic/Focus:  Goals Group:   The focus of this group is to help patients establish daily goals to achieve during treatment and discuss how the patient can incorporate goal setting into their daily lives to aide in recovery.    Participation Level:  Did Not Attend  Jerry [name] 07/15/2023, 8:47 AM

## 2023-07-15 NOTE — [facility] Suicide Risk Assessment (Signed)
 [facility] Admission Suicide Risk Assessment   Nursing information obtained from:  Patient Demographic factors:  Male, Caucasian, Low socioeconomic status, Unemployed, Living alone Current Mental Status:  Suicidal ideation indicated by patient Loss Factors:  Financial problems / change in socioeconomic status Historical Factors:  NA Risk Reduction Factors:  NA  Total Time spent with patient: 1 hour Principal Problem: Schizophrenia (HCC) Diagnosis:  Principal Problem:   Schizophrenia (HCC)  Subjective Data:  Jerry [name] is a 48 yr old male who presented on 4/23 to [facility] with complaints of paranoia, he was admitted to [facility] on 4/27.  PPHx is significant for Schizophrenia, Depression, PTSD, and Polysubstance Abuse (EtOH, Cocaine, Meth, THC, Barbiturates), and multiple Psychiatric Hospitalizations (last [facility] 06/2023).   He reports that he was hospitalized due to people chasing him and hearing voices.  He reports that this has been happening for about a month and that it has been getting worse.  He reports that he called the ambulance to take him to the hospital because he knew he needed help.   He reports past psychiatric history significant for paranoid schizophrenia, PTSD, depression, and polysubstance abuse.  He reports no history of suicide attempts.  He reports no history of self-injurious behavior.  He reports multiple prior psychiatric hospitalizations-last [facility]/2025.  He reports no significant past medical history.  He reports a past surgical history as having 4 kidney donation 2003.  He reports no history of head trauma.  He reports a history of withdrawal seizures the last 1 being about 2 years ago.  He reports an allergy to penicillin.   He is currently unhoused.  He reports he is currently unemployed.  He reports drinking about 2/5 of liquor a day prior to attempting to cut back.  He reports he has had DTs and seizures in the past.  He reports he does smoke cigarettes.  When asked about  substance use he reports "been on everything but heroin."  He reports he does have pending charges but does not know what they are.  He reports no access to firearms.   When asked what medications he was on the past he reports he does not remember but he is willing to continue them.  He reports no other concerns at present.  Continued Clinical Symptoms:    The "Alcohol Use Disorders Identification Test", Guidelines for Use in Primary Care, Second Edition.  World Science writer [facility]). Score between 0-7:  no or low risk or alcohol related problems. Score between 8-15:  moderate risk of alcohol related problems. Score between 16-19:  high risk of alcohol related problems. Score 20 or above:  warrants further diagnostic evaluation for alcohol dependence and treatment.   CLINICAL FACTORS:   Depression:   Comorbid alcohol abuse/dependence Severe Alcohol/Substance Abuse/Dependencies Schizophrenia:   Paranoid or undifferentiated type More than one psychiatric diagnosis Currently Psychotic Unstable or Poor Therapeutic Relationship Previous Psychiatric Diagnoses and Treatments   Musculoskeletal: Strength & Muscle Tone: within normal limits Gait & Station:  Laying in bed Patient leans: N/A  Psychiatric Specialty Exam:  Presentation  General Appearance:  Disheveled  Eye Contact: Fleeting  Speech: Clear and Coherent  Speech Volume: Normal  Handedness: Right   Mood and Affect  Mood: Hopeless  Affect: Depressed; Flat   Thought Process  Thought Processes: Coherent  Descriptions of Associations:Intact  Orientation:Full (Time, Place and Person)  Thought Content:WDL  History of Schizophrenia/Schizoaffective disorder:Yes  Duration of Psychotic Symptoms:Greater than six months  Hallucinations:No data recorded Ideas of Reference:None  Suicidal Thoughts:No data recorded Homicidal Thoughts:No data recorded  Sensorium  Memory: Immediate Fair; Recent  Fair  Judgment: Fair  Insight: Fair   Chartered certified accountant: Fair  Attention Span: Fair  Recall: [employer] of Knowledge: Fair  Language: Fair   Psychomotor Activity  Psychomotor Activity:No data recorded  Assets  Assets: Communication Skills; Social Support   Sleep  Sleep:No data recorded   Physical Exam: Physical Exam Vitals and nursing note reviewed.  Constitutional:      General: He is not in acute distress.    Appearance: He is normal weight. He is not ill-appearing or toxic-appearing.  HENT:     Head: Normocephalic and atraumatic.  Pulmonary:     Effort: Pulmonary effort is normal.    Review of Systems  Constitutional:  Positive for diaphoresis and malaise/fatigue.  Respiratory:  Negative for cough and shortness of breath.   Cardiovascular:  Negative for chest pain.  Gastrointestinal:  Negative for abdominal pain, constipation, diarrhea, nausea and vomiting.  Neurological:  Positive for tremors and headaches. Negative for dizziness and weakness.  Psychiatric/Behavioral:  Positive for depression, hallucinations (AH) and substance abuse. Negative for suicidal ideas. The patient is nervous/anxious.    Blood pressure 107/89, pulse 91, temperature 98.1 F (36.7 C), temperature source Oral, resp. rate 16, height 5\' 9"  (1.753 m), weight 71.7 kg, SpO2 98%. Body mass index is 23.33 kg/m.   COGNITIVE FEATURES THAT CONTRIBUTE TO RISK:  Loss of executive function    SUICIDE RISK:   Mild:  Suicidal ideation of limited frequency, intensity, duration, and specificity.  There are no identifiable plans, no associated intent, mild dysphoria and related symptoms, good self-control (both objective and subjective assessment), few other risk factors, and identifiable protective factors, including available and accessible social support.  PLAN OF CARE:   Jerry [name] is a 48 yr old male who presented on 4/23 to [facility] with complaints of paranoia, he  was admitted to [facility] on 4/27.  PPHx is significant for Schizophrenia, Depression, PTSD, and Polysubstance Abuse (EtOH, Cocaine, Meth, THC, Barbiturates), and multiple Psychiatric Hospitalizations (last [facility] 06/2023).     Jasani has a well documented history of Schizophrenia and substance abuse. He is currently disoriented and unable to fully interview.  As he was already started on Haldol  we will continue with this.   We will continue with the CIWA and PRN Ativan  given his continued elevated CIWA.  Given his history of withdrawal seizures we will also order a quick taper of ativan .     Schizophrenia: -Continue Haldol  5 mg BID for psychosis -Will consider starting antidepressant once more stable -Continue Agitation Protocol: Haldol /Ativan Jerry [name]     Withdrawal: -Continue CIWA, last score @CIWA @ -Continue Ativan  1 mg q6 PRN CIWA>10 -Start Ativan  taper to end 4/30 -Continue Imodium  2-4 mg PRN diarrhea -Continue Zofran -ODT 4 mg q6 PRN nausea -Continue Thiamine  100 mg daily for nutritional supplementation     -Continue Doxycycline  100 mg BID -Continue Ensure 237 mL BID -Continue PRN's: Tylenol , Maalox, Atarax , Milk of Magnesia  I certify that inpatient services furnished can reasonably be expected to improve the patient's condition.   Basilia Bosworth, MD 07/15/2023, 8:37 AM
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
## 2023-07-16 ENCOUNTER — Encounter (HOSPITAL_COMMUNITY): Payer: Self-pay

## 2023-07-16 ENCOUNTER — Telehealth: Payer: Self-pay

## 2023-07-16 DIAGNOSIS — F200 Paranoid schizophrenia: Secondary | ICD-10-CM | POA: Diagnosis not present

## 2023-07-16 NOTE — Group Note (Signed)
 Occupational Therapy Group Note  Group Topic: Sleep Hygiene  Group Date: 07/16/2023 Start Time: 1430 End Time: 1504 Facilitators: Lynnda Sas, OT   Group Description: Group encouraged increased participation and engagement through topic focused on sleep hygiene. Patients reflected on the quality of sleep they typically receive and identified areas that need improvement. Group was given background information on sleep and sleep hygiene, including common sleep disorders. Group members also received information on how to improve one's sleep and introduced a sleep diary as a tool that can be utilized to track sleep quality over a length of time. Group session ended with patients identifying one or more strategies they could utilize or implement into their sleep routine in order to improve overall sleep quality.        Therapeutic Goal(s):  Identify one or more strategies to improve overall sleep hygiene  Identify one or more areas of sleep that are negatively impacted (sleep too much, too little, etc)     Participation Level: Engaged   Participation Quality: Independent   Behavior: Appropriate   Speech/Thought Process: Relevant   Affect/Mood: Appropriate   Insight: Fair   Judgement: Fair      Modes of Intervention: Education  Patient Response to Interventions:  Attentive   Plan: Continue to engage patient in OT groups 2 - 3x/week.  07/16/2023  Lynnda Sas, OT   Acsa Estey, OT

## 2023-07-16 NOTE — Transitions of Care (Post Inpatient/ED Visit) (Unsigned)
   07/16/2023  Name: Jerry [name] MRN: [id number] DOB: Mar 19, 1976  Today's TOC FU Call Status: Today's TOC FU Call Status:: Unsuccessful Call (1st Attempt) Unsuccessful Call (1st Attempt) Date: 07/16/23  Attempted to reach the patient regarding the most recent Inpatient/ED visit.  Follow Up Plan: No further outreach attempts will be made at this time. We have been unable to contact the patient.  Signature  [employer], [place]

## 2023-07-16 NOTE — Care Management Note (Signed)
 The patient has no phone, and does not have any social supports or anyone's number to use in order to receive an actual appointment for follow up services.  I added a date and time to go, and the walk in hours for [facility] for therapy, and for [facility] for med mgmt.

## 2023-07-16 NOTE — Group Note (Signed)
 Date:  07/16/2023 Time:  9:21 AM  Group Topic/Focus:  Goals Group:   The focus of this group is to help patients establish daily goals to achieve during treatment and discuss how the patient can incorporate goal setting into their daily lives to aide in recovery.    Participation Level:  Did Not Attend  Linnell Richardson 07/16/2023, 9:21 AM

## 2023-07-16 NOTE — BHH Group Notes (Signed)
 BHH Group Notes:  (Nursing/MHT/Case Management/Adjunct)  Date:  07/16/2023  Time:  12:30 AM  Type of Therapy:   Wrap-up group  Participation Level:  Did Not Attend  Participation Quality:    Affect:    Cognitive:    Insight:    Engagement in Group:    Modes of Intervention:    Summary of Progress/Problems: refused to attend group.   Alvaro Augusta 07/16/2023, 12:30 AM

## 2023-07-16 NOTE — Progress Notes (Signed)
 D:  Patient denied SI and HI, contracts for safety.  Denied A/V hallucinations. A:  Medications administered per MD orders.  Emotional support and encouragement given patient. R:  Safety maintained with 15 minute checks. Patient stated he slept about 4-5 hours.

## 2023-07-16 NOTE — Progress Notes (Signed)
 [facility] MD Progress Note  07/16/2023 2:45 PM Jerry [name]  MRN:  [id number] Subjective:  Jerry [name] " mumbling during this assessment.  " Principal Problem: Schizophrenia (HCC) Diagnosis: Principal Problem:   Schizophrenia (HCC)  Yesterday the psychiatry team made the following recommendations: -Continue Haldol  5 mg BID for psychosis -Will consider starting antidepressant once more stable -Continue Agitation Protocol: Haldol /Ativan /Benadryl  24-hour chart review: Vital signs without critical values.  Patient compliant with his scheduled psychotropic medications.  Requires as needed hydroxyzine  x 2 for anxiety.  CIWA score of 3 due to headache, anxiety and tremors.  Today's assessment notes: On assessment today, the pt reports that his mood is depressed with constricted affect.  Rates depression as #8/10 with 10 being high severity.  During assessment patient appears to be responding to internal and external stimuli.  Speech remained mumbling and disorganized.  Sitting and backing this provider during this assessment.  CIWA score as noted above.  Complaint of headache anxiety & with fine tremors, medicated as needed by nursing staff.  Denies nausea or vomiting. Reports that anxiety is rated at #8/10 with 10 being the worst. Nursing staff report patient sleeping over 5 hours last night.   Appetite is good Concentration is fair Energy level is adequate Denies suicidal thoughts.  Further denies suicidal intent and plan.  Denies having any HI.  Denies having psychotic symptoms, however appears to be responding to internal or external stimuli.  Denies having side effects to current psychiatric medications.   We discussed compliance to current medication regimen.  Discussed the following psychosocial stressors: Of substance use disorder, empathy, active listening and emotional support provided.   Total Time spent with patient: 45 minutes  Past Psychiatric History: Schizophrenia,  Depression, PTSD, and Polysubstance Abuse (EtOH, Cocaine, Meth, THC, Barbiturates), and multiple Psychiatric Hospitalizations (last [facility] 06/2023).   Past Medical History:  Past Medical History:  Diagnosis Date   Anxiety    Bipolar 1 disorder (HCC)    Depression    Drug-induced seizure (HCC)    EtOH dependence (HCC)    Hallucination    Paranoid schizophrenia (HCC)    since pt's 20's   PTSD (post-traumatic stress disorder)     Past Surgical History:  Procedure Laterality Date   KIDNEY DONATION Right 2003   Family History:  Family History  Problem Relation Age of Onset   Heart disease Mother    Cancer Mother    Lung cancer Mother    Heart disease Father    Diabetes Father    Diabetes Maternal Grandmother    Stroke Paternal Grandfather    Family Psychiatric  History: See H&P Social History:  Social History   Substance and Sexual Activity  Alcohol Use Yes   Comment: Decreased from 12 cans of beer and 20 shots of liquor about 2 months ago     Social History   Substance and Sexual Activity  Drug Use Yes   Types: Cocaine, Heroin, Amphetamines, Methamphetamines, Marijuana   Comment: last drug use a few days ago    Social History   Socioeconomic History   Marital status: Married    Spouse name: Not on file   Number of children: Not on file   Years of education: Not on file   Highest education level: Not on file  Occupational History   Not on file  Tobacco Use   Smoking status: Every Day    Current packs/day: 1.00    Types: Cigarettes   Smokeless tobacco: Never  Vaping Use   Vaping status: Never Used  Substance and Sexual Activity   Alcohol use: Yes    Comment: Decreased from 12 cans of beer and 20 shots of liquor about 2 months ago   Drug use: Yes    Types: Cocaine, Heroin, Amphetamines, Methamphetamines, Marijuana    Comment: last drug use a few days ago   Sexual activity: Yes  Other Topics Concern   Not on file  Social History Narrative   Lives alone  currently   Social Drivers of Health   Financial Resource Strain: Not on file  Food Insecurity: Patient Unable To Answer (07/14/2023)   Hunger Vital Sign    Worried About Running Out of Food in the Last Year: Patient unable to answer    Ran Out of Food in the Last Year: Patient unable to answer  Recent Concern: Food Insecurity - Food Insecurity Present (07/01/2023)   Hunger Vital Sign    Worried About Running Out of Food in the Last Year: Sometimes true    Ran Out of Food in the Last Year: Sometimes true  Transportation Needs: Patient Declined (07/14/2023)   PRAPARE - Administrator, Civil Service (Medical): Patient declined    Lack of Transportation (Non-Medical): Patient declined  Recent Concern: Transportation Needs - Unmet Transportation Needs (07/01/2023)   PRAPARE - Administrator, Civil Service (Medical): Yes    Lack of Transportation (Non-Medical): Yes  Physical Activity: Not on file  Stress: Not on file  Social Connections: Socially Isolated (07/01/2023)   Social Connection and Isolation Panel [NHANES]    Frequency of Communication with Friends and Family: Never    Frequency of Social Gatherings with Friends and Family: Never    Attends Religious Services: Never    Database administrator or Organizations: No    Attends Engineer, structural: Never    Marital Status: Separated   Additional Social History:    Sleep: Good  Appetite:  Good  Current Medications: Current Facility-Administered Medications  Medication Dose Route Frequency Provider Last Rate Last Admin   acetaminophen  (TYLENOL ) tablet 650 mg  650 mg Oral Q6H PRN Motley-Mangrum, Jadeka A, PMHNP       alum & mag hydroxide-simeth (MAALOX/MYLANTA) 200-200-20 MG/5ML suspension 30 mL  30 mL Oral Q4H PRN Motley-Mangrum, Jadeka A, PMHNP       haloperidol  (HALDOL ) tablet 5 mg  5 mg Oral TID PRN Motley-Mangrum, Jadeka A, PMHNP       And   diphenhydrAMINE (BENADRYL) capsule 50 mg  50 mg Oral  TID PRN Motley-Mangrum, Jadeka A, PMHNP       haloperidol  lactate (HALDOL ) injection 5 mg  5 mg Intramuscular TID PRN Motley-Mangrum, Jadeka A, PMHNP       And   diphenhydrAMINE (BENADRYL) injection 50 mg  50 mg Intramuscular TID PRN Motley-Mangrum, Jadeka A, PMHNP       And   LORazepam  (ATIVAN ) injection 2 mg  2 mg Intramuscular TID PRN Motley-Mangrum, Jadeka A, PMHNP       haloperidol  lactate (HALDOL ) injection 10 mg  10 mg Intramuscular TID PRN Motley-Mangrum, Jadeka A, PMHNP       And   diphenhydrAMINE (BENADRYL) injection 50 mg  50 mg Intramuscular TID PRN Motley-Mangrum, Jadeka A, PMHNP       And   LORazepam  (ATIVAN ) injection 2 mg  2 mg Intramuscular TID PRN Motley-Mangrum, Jadeka A, PMHNP       doxycycline  (VIBRA -TABS) tablet 100 mg  100  mg Oral Q12H Motley-Mangrum, Jadeka A, PMHNP   100 mg at 07/16/23 4132   feeding supplement (ENSURE ENLIVE / ENSURE PLUS) liquid 237 mL  237 mL Oral BID BM Zouev, Dmitri, MD   237 mL at 07/16/23 0932   haloperidol  (HALDOL ) tablet 5 mg  5 mg Oral BID Motley-Mangrum, Jadeka A, PMHNP   5 mg at 07/16/23 0848   hydrOXYzine  (ATARAX ) tablet 25 mg  25 mg Oral TID PRN Motley-Mangrum, Jadeka A, PMHNP   25 mg at 07/15/23 2123   loperamide  (IMODIUM ) capsule 2-4 mg  2-4 mg Oral PRN Pashayan, Alexander S, MD       LORazepam  (ATIVAN ) tablet 1 mg  1 mg Oral BID Pashayan, Alexander S, MD   1 mg at 07/16/23 4401   Followed by   Cecily Cohen ON 07/17/2023] LORazepam  (ATIVAN ) tablet 1 mg  1 mg Oral Q0600 Pashayan, Alexander S, MD       Followed by   Cecily Cohen ON 07/18/2023] LORazepam  (ATIVAN ) tablet 0.5 mg  0.5 mg Oral Q0600 Pashayan, Alexander S, MD       LORazepam  (ATIVAN ) tablet 1 mg  1 mg Oral Q6H PRN Basilia Bosworth, MD       magnesium  hydroxide (MILK OF MAGNESIA) suspension 30 mL  30 mL Oral Daily PRN Motley-Mangrum, Jadeka A, PMHNP       ondansetron  (ZOFRAN -ODT) disintegrating tablet 4 mg  4 mg Oral Q6H PRN Basilia Bosworth, MD       thiamine  (Vitamin B-1) tablet  100 mg  100 mg Oral Daily Motley-Mangrum, Jadeka A, PMHNP   100 mg at 07/16/23 0272   Or   thiamine  (VITAMIN B1) injection 100 mg  100 mg Intravenous Daily Motley-Mangrum, Jadeka A, PMHNP       Lab Results: No results found for this or any previous visit (from the past 48 hours).  Blood Alcohol level:  Lab Results  Component Value Date   [facility] <15 07/13/2023   ETH <15 07/11/2023   Metabolic Disorder Labs: Lab Results  Component Value Date   HGBA1C 5.3 07/09/2022   MPG 105.41 07/09/2022   MPG 111.15 10/31/2020   No results found for: "PROLACTIN" Lab Results  Component Value Date   CHOL 162 07/09/2022   TRIG 86 07/09/2022   HDL 67 07/09/2022   CHOLHDL 2.4 07/09/2022   VLDL 17 07/09/2022   LDLCALC 78 07/09/2022   LDLCALC 82 10/31/2020   Physical Findings: AIMS:  , ,  ,  ,    CIWA:  CIWA-Ar Total: 3 COWS:     Musculoskeletal: Strength & Muscle Tone: within normal limits Gait & Station: normal Patient leans: N/A  Psychiatric Specialty Exam:  Presentation  General Appearance:  Disheveled  Eye Contact: None  Speech: Slurred; Garbled  Speech Volume: Decreased  Handedness: Right  Mood and Affect  Mood: Depressed; Anxious; Dysphoric  Affect: Congruent; Flat  Thought Process  Thought Processes: Linear; Disorganized  Descriptions of Associations:Loose  Orientation:Partial  Thought Content:Illogical; Paranoid Ideation  History of Schizophrenia/Schizoaffective disorder:Yes  Duration of Psychotic Symptoms:Greater than six months  Hallucinations:Hallucinations: Auditory Description of Auditory Hallucinations: Report yes to auditory hallucination, however will not explain.  Appears to be responding to internal stimuli  Ideas of Reference:Paranoia  Suicidal Thoughts:Suicidal Thoughts: No  Homicidal Thoughts:Homicidal Thoughts: No  Sensorium  Memory: Recent Poor; Remote Poor; Immediate Poor  Judgment: Impaired  Insight: Lacking  Executive  Functions  Concentration: Poor  Attention Span: Poor  Recall: Poor  Fund of Knowledge: Poor  Language: Fair Fish farm manager)  Psychomotor Activity  Psychomotor Activity: Psychomotor Activity: Decreased  Assets  Assets: Communication Skills; Physical Health; Resilience  Sleep  Sleep: Sleep: Good Number of Hours of Sleep: 6  Physical Exam: Physical Exam Vitals and nursing note reviewed.  Constitutional:      General: He is not in acute distress.    Appearance: He is not toxic-appearing.  HENT:     Head: Normocephalic.     Right Ear: External ear normal.     Nose: Nose normal.     Mouth/Throat:     Mouth: Mucous membranes are moist.     Pharynx: Oropharynx is clear.  Eyes:     Extraocular Movements: Extraocular movements intact.  Cardiovascular:     Rate and Rhythm: Normal rate.     Pulses: Normal pulses.  Pulmonary:     Effort: Pulmonary effort is normal.  Abdominal:     Comments: Deferred  Genitourinary:    Comments: Deferred Musculoskeletal:        General: Normal range of motion.     Cervical back: Normal range of motion.  Skin:    General: Skin is warm.  Neurological:     General: No focal deficit present.     Mental Status: He is alert and oriented to person, place, and time.  Psychiatric:        Mood and Affect: Mood normal.        Behavior: Behavior normal.     Comments: Eye contact, thought content, and judgment are poor.    Review of Systems  Constitutional:  Negative for chills and fever.  HENT:  Negative for sore throat.   Eyes:  Negative for blurred vision.  Respiratory:  Negative for cough, sputum production, shortness of breath and wheezing.   Cardiovascular:  Negative for chest pain and palpitations.  Gastrointestinal:  Negative for heartburn and nausea.  Genitourinary:  Negative for dysuria, frequency and urgency.  Musculoskeletal:  Negative for falls.  Skin:  Negative for itching and rash.  Neurological:  Positive for tremors and  headaches. Negative for dizziness.       Nursing staff document that she was score of 3.  Due to headache, anxiety, and tremor.  Endo/Heme/Allergies:        See allergy listing  Psychiatric/Behavioral:  Positive for depression and hallucinations. Negative for substance abuse and suicidal ideas. The patient is nervous/anxious. The patient does not have insomnia.    Blood pressure 110/75, pulse 71, temperature 98 F (36.7 C), temperature source Oral, resp. rate 16, height 5\' 9"  (1.753 m), weight 71.7 kg, SpO2 100%. Body mass index is 23.33 kg/m.   Treatment Plan Summary: Daily contact with patient to assess and evaluate symptoms and progress in treatment and Medication management   Jerry [name] is a 48 yr old male who presented on 4/23 to [facility] with complaints of paranoia, he was admitted to [facility] on 4/27.  PPHx is significant for Schizophrenia, Depression, PTSD, and Polysubstance Abuse (EtOH, Cocaine, Meth, THC, Barbiturates), and multiple Psychiatric Hospitalizations (last [facility] 06/2023).   Jerry [name] has a well documented history of Schizophrenia and substance abuse. He is currently disoriented and unable to fully interview.  As he was already started on Haldol  we will continue with this.   We will continue with the CIWA and PRN Ativan  given his continued elevated CIWA.  Given his history of withdrawal seizures we will also order a quick taper of ativan .   Schizophrenia: -Continue Haldol  5 mg BID for psychosis -  Will consider starting antidepressant once more stable -Continue Agitation Protocol: Haldol /Ativan Jerry [name]   Withdrawal: -Continue CIWA, last score @CIWA @ -Continue Ativan  1 mg q6 PRN CIWA>10 -Start Ativan  taper to end 4/30 -Continue Imodium  2-4 mg PRN diarrhea -Continue Zofran -ODT 4 mg q6 PRN nausea -Continue Thiamine  100 mg daily for nutritional supplementation   -Continue Doxycycline  100 mg BID -Continue Ensure 237 mL BID -Continue PRN's: Tylenol , Maalox, Atarax , Milk of  Magnesia   Observation Level/Precautions:  15 minute checks  Laboratory:  CMP:WNL except Na: 131,  Albu: 3.3, ALT: 45,  CBC: WNL except RBC: 3.81,  Hem: 12.6,  HCT: 37.8,  Plat: 418,  Eosin Abs: 0.6,  UDS: Benzo, Amphet, THC, Barbiturates positive,  EKG: Sinus Rhythm w/ marked Sinus Arrhythmia w/ Qtc: 481.  Psychotherapy:    Medications:  Haldol , Ativan   Consultations:    Discharge Concerns:    Estimated LOS: 5-7 days  Other:      Physician Treatment Plan for Primary Diagnosis: Schizophrenia (HCC) Long Term Goal(s): Improvement in symptoms so as ready for discharge   Short Term Goals: Ability to identify changes in lifestyle to reduce recurrence of condition will improve, Ability to verbalize feelings will improve, Ability to demonstrate self-control will improve, Ability to identify and develop effective coping behaviors will improve, Ability to maintain clinical measurements within normal limits will improve, and Ability to identify triggers associated with substance abuse/mental health issues will improve   Physician Treatment Plan for Secondary Diagnosis: Principal Problem:   Schizophrenia (HCC)   Long Term Goal(s): Improvement in symptoms so as ready for discharge   Short Term Goals: Ability to identify changes in lifestyle to reduce recurrence of condition will improve, Ability to verbalize feelings will improve, Ability to demonstrate self-control will improve, Ability to identify and develop effective coping behaviors will improve, Ability to maintain clinical measurements within normal limits will improve, and Ability to identify triggers associated with substance abuse/mental health issues will improve   I certify that inpatient services furnished can reasonably be expected to improve the patient's condition.     Laurence Pons, FNP 07/16/2023, 2:45 PM

## 2023-07-16 NOTE — Progress Notes (Signed)
   07/15/23 2130  Psychosocial Assessment  Eye Contact Avoids  Facial Expression Flat  Affect Appropriate to circumstance  Speech Soft  Interaction Isolative  Motor Activity Slow  Appearance/Hygiene Disheveled;Poor hygiene  Behavior Characteristics Appropriate to situation  Mood Depressed;Sad  Thought Process  Coherency WDL  Content Paranoia  Delusions Paranoid  Perception Hallucinations  Hallucination Auditory  Judgment Poor  Confusion Mild  Danger to Self  Current suicidal ideation? Denies  Agreement Not to Harm Self Yes  Description of Agreement verbally

## 2023-07-16 NOTE — Plan of Care (Signed)
 Nurse discussed anxiety, depression and coping skills with patient.

## 2023-07-16 NOTE — Group Note (Signed)
 Date:  07/16/2023 Time:  8:12 PM  Group Topic/Focus:   Pt did attend AA group   Jerry [name] A Jerry [name] 07/16/2023, 8:12 PM

## 2023-07-16 NOTE — BH IP Treatment Plan (Signed)
 Interdisciplinary Treatment and Diagnostic Plan Update  07/16/2023 Time of Session: 11:10 AM PARTHA HORTA MRN: [id number]  Principal Diagnosis: Schizophrenia [facility])  Secondary Diagnoses: Principal Problem:   Schizophrenia (HCC)   Current Medications:  Current Facility-Administered Medications  Medication Dose Route Frequency Provider Last Rate Last Admin   acetaminophen  (TYLENOL ) tablet 650 mg  650 mg Oral Q6H PRN Motley-Mangrum, Jadeka A, PMHNP       alum & mag hydroxide-simeth (MAALOX/MYLANTA) 200-200-20 MG/5ML suspension 30 mL  30 mL Oral Q4H PRN Motley-Mangrum, Jadeka A, PMHNP       haloperidol  (HALDOL ) tablet 5 mg  5 mg Oral TID PRN Motley-Mangrum, Jadeka A, PMHNP       And   diphenhydrAMINE (BENADRYL) capsule 50 mg  50 mg Oral TID PRN Motley-Mangrum, Jadeka A, PMHNP       haloperidol  lactate (HALDOL ) injection 5 mg  5 mg Intramuscular TID PRN Motley-Mangrum, Jadeka A, PMHNP       And   diphenhydrAMINE (BENADRYL) injection 50 mg  50 mg Intramuscular TID PRN Motley-Mangrum, Jadeka A, PMHNP       And   LORazepam  (ATIVAN ) injection 2 mg  2 mg Intramuscular TID PRN Motley-Mangrum, Jadeka A, PMHNP       haloperidol  lactate (HALDOL ) injection 10 mg  10 mg Intramuscular TID PRN Motley-Mangrum, Jadeka A, PMHNP       And   diphenhydrAMINE (BENADRYL) injection 50 mg  50 mg Intramuscular TID PRN Motley-Mangrum, Jadeka A, PMHNP       And   LORazepam  (ATIVAN ) injection 2 mg  2 mg Intramuscular TID PRN Motley-Mangrum, Jadeka A, PMHNP       doxycycline  (VIBRA -TABS) tablet 100 mg  100 mg Oral Q12H Motley-Mangrum, Jadeka A, PMHNP   100 mg at 07/16/23 0865   feeding supplement (ENSURE ENLIVE / ENSURE PLUS) liquid 237 mL  237 mL Oral BID BM Zouev, Dmitri, MD   237 mL at 07/16/23 1456   haloperidol  (HALDOL ) tablet 5 mg  5 mg Oral BID Motley-Mangrum, Jadeka A, PMHNP   5 mg at 07/16/23 1703   hydrOXYzine  (ATARAX ) tablet 25 mg  25 mg Oral TID PRN Motley-Mangrum, Jadeka A, PMHNP   25 mg at 07/16/23 1453    loperamide  (IMODIUM ) capsule 2-4 mg  2-4 mg Oral PRN Pashayan, Alexander S, MD       LORazepam  (ATIVAN ) tablet 1 mg  1 mg Oral BID Pashayan, Alexander S, MD   1 mg at 07/16/23 0849   Followed by   Cecily Cohen ON 07/17/2023] LORazepam  (ATIVAN ) tablet 1 mg  1 mg Oral Q0600 Pashayan, Alexander S, MD       Followed by   Cecily Cohen ON 07/18/2023] LORazepam  (ATIVAN ) tablet 0.5 mg  0.5 mg Oral Q0600 Pashayan, Knute Perla, MD       LORazepam  (ATIVAN ) tablet 1 mg  1 mg Oral Q6H PRN Basilia Bosworth, MD       magnesium  hydroxide (MILK OF MAGNESIA) suspension 30 mL  30 mL Oral Daily PRN Motley-Mangrum, Jadeka A, PMHNP       ondansetron  (ZOFRAN -ODT) disintegrating tablet 4 mg  4 mg Oral Q6H PRN Pashayan, Alexander S, MD       thiamine  (Vitamin B-1) tablet 100 mg  100 mg Oral Daily Motley-Mangrum, Jadeka A, PMHNP   100 mg at 07/16/23 0849   Or   thiamine  (VITAMIN B1) injection 100 mg  100 mg Intravenous Daily Motley-Mangrum, Jadeka A, PMHNP       PTA Medications: No medications prior  to admission.    Patient Stressors: Financial difficulties   Substance abuse    Patient Strengths: Capable of independent living  Printmaker for treatment/growth   Treatment Modalities: Medication Management, Group therapy, Case management,  1 to 1 session with clinician, Psychoeducation, Recreational therapy.   Physician Treatment Plan for Primary Diagnosis: Schizophrenia (HCC) Long Term Goal(s): Improvement in symptoms so as ready for discharge   Short Term Goals: Ability to identify changes in lifestyle to reduce recurrence of condition will improve Ability to verbalize feelings will improve Ability to demonstrate self-control will improve Ability to identify and develop effective coping behaviors will improve Ability to maintain clinical measurements within normal limits will improve Ability to identify triggers associated with substance abuse/mental health issues will improve  Medication  Management: Evaluate patient's response, side effects, and tolerance of medication regimen.  Therapeutic Interventions: 1 to 1 sessions, Unit Group sessions and Medication administration.  Evaluation of Outcomes: Not Progressing  Physician Treatment Plan for Secondary Diagnosis: Principal Problem:   Schizophrenia (HCC)  Long Term Goal(s): Improvement in symptoms so as ready for discharge   Short Term Goals: Ability to identify changes in lifestyle to reduce recurrence of condition will improve Ability to verbalize feelings will improve Ability to demonstrate self-control will improve Ability to identify and develop effective coping behaviors will improve Ability to maintain clinical measurements within normal limits will improve Ability to identify triggers associated with substance abuse/mental health issues will improve     Medication Management: Evaluate patient's response, side effects, and tolerance of medication regimen.  Therapeutic Interventions: 1 to 1 sessions, Unit Group sessions and Medication administration.  Evaluation of Outcomes: Not Progressing   RN Treatment Plan for Primary Diagnosis: Schizophrenia (HCC) Long Term Goal(s): Knowledge of disease and therapeutic regimen to maintain health will improve  Short Term Goals: Ability to remain free from injury will improve, Ability to verbalize frustration and anger appropriately will improve, Ability to demonstrate self-control, Ability to participate in decision making will improve, Ability to verbalize feelings will improve, Ability to disclose and discuss suicidal ideas, Ability to identify and develop effective coping behaviors will improve, and Compliance with prescribed medications will improve  Medication Management: RN will administer medications as ordered by provider, will assess and evaluate patient's response and provide education to patient for prescribed medication. RN will report any adverse and/or side effects  to prescribing provider.  Therapeutic Interventions: 1 on 1 counseling sessions, Psychoeducation, Medication administration, Evaluate responses to treatment, Monitor vital signs and CBGs as ordered, Perform/monitor CIWA, COWS, AIMS and Fall Risk screenings as ordered, Perform wound care treatments as ordered.  Evaluation of Outcomes: Not Progressing   LCSW Treatment Plan for Primary Diagnosis: Schizophrenia (HCC) Long Term Goal(s): Safe transition to appropriate next level of care at discharge, Engage patient in therapeutic group addressing interpersonal concerns.  Short Term Goals: Engage patient in aftercare planning with referrals and resources, Increase social support, Increase ability to appropriately verbalize feelings, Increase emotional regulation, Facilitate acceptance of mental health diagnosis and concerns, Facilitate patient progression through stages of change regarding substance use diagnoses and concerns, Identify triggers associated with mental health/substance abuse issues, and Increase skills for wellness and recovery  Therapeutic Interventions: Assess for all discharge needs, 1 to 1 time with Social worker, Explore available resources and support systems, Assess for adequacy in community support network, Educate family and significant other(s) on suicide prevention, Complete Psychosocial Assessment, Interpersonal group therapy.  Evaluation of Outcomes: Not Progressing   Progress in Treatment:  Attending groups: Attended some groups Participating in groups: Yes. Taking medication as prescribed: Yes. Toleration medication: Yes. Family/Significant other contact made: patient declined consents Patient understands diagnosis: Yes. Discussing patient identified problems/goals with staff: Yes. Medical problems stabilized or resolved: Yes. Denies suicidal/homicidal ideation: Yes. Issues/concerns per patient self-inventory: No.  New problem(s) identified:  No  New Short  Term/Long Term Goal(s): Patient recently admitted. CSW will continue to follow and assess for appropriate referrals and possible discharge planning.    Patient Goals:  "I want to get sober.  Alcohol is my main issue, but I also smoke weed."   Discharge Plan or Barriers:  Patient recently admitted. CSW will continue to follow and assess for appropriate referrals and possible discharge planning.    Reason for Continuation of Hospitalization: Hallucinations Medication stabilization  Estimated Length of Stay:  5 - 7 days  Last 3 [place] Suicide Severity Risk Score: Flowsheet Row [facility] (Current) [facility] from [facility]13/2023 [facility] ED from [facility]/11/2023 in [facility] at [facility]  C-SSRS RISK CATEGORY Low Risk Error: Q7 should not be populated when Q6 is No High Risk       Last PHQ 2/9 Scores:    11/29/2022    2:51 PM 07/11/2022    9:36 AM 07/09/2022    2:03 PM  Depression screen PHQ 2/9  Decreased Interest 0 0 2  Down, Depressed, Hopeless 3 0 3  PHQ - 2 Score 3 0 5  Altered sleeping 3  3  Tired, decreased energy 0  2  Change in appetite 3  3  Feeling bad or failure about yourself  3  3  Trouble concentrating 0  3  Moving slowly or fidgety/restless 0  1  Suicidal thoughts 0  2  PHQ-9 Score 12  22  Difficult doing work/chores Not difficult at all      Scribe for Treatment Team: Meggan Dhaliwal O Phillp Dolores, LCSWA 07/16/2023 6:25 PM

## 2023-07-16 NOTE — [facility] Group Notes (Signed)

## 2023-07-16 NOTE — Plan of Care (Signed)
   Problem: Education: Goal: Emotional status will improve Outcome: Not Progressing Goal: Mental status will improve Outcome: Not Progressing   Problem: Activity: Goal: Interest or engagement in activities will improve Outcome: Not Progressing

## 2023-07-16 NOTE — Group Note (Signed)
 Recreation Therapy Group Note   Group Topic:Stress Management  Group Date: 07/16/2023 Start Time: 0930 End Time: 1610 Facilitators: Estrella Alcaraz-McCall, LRT,CTRS Location: 300 Hall Dayroom   Group Topic: Stress Management  Goal Area(s) Addresses:  Patient will identify positive stress management techniques. Patient will identify benefits of using stress management post d/c.  Intervention: Insight Timer App  Activity: Morning Meditation. LRT played a meditation that focused on preparing for the day ahead. The meditation guided patients to envision being on the beach and breathing with the flow of the water washing onto the sand. The meditation also encouraged patients to picture the positives they should expect during the day.    Education:  Stress Management, Discharge Planning.   Education Outcome: Acknowledges Education   Affect/Mood: N/A   Participation Level: Did not attend    Clinical Observations/Individualized Feedback:     Plan: Continue to engage patient in RT group sessions 2-3x/week.   Jerry [name], LRT,CTRS 07/16/2023 12:29 PM

## 2023-07-17 DIAGNOSIS — F200 Paranoid schizophrenia: Secondary | ICD-10-CM | POA: Diagnosis not present

## 2023-07-17 NOTE — Group Note (Signed)
 Date:  07/17/2023 Time:  8:55 AM  Group Topic/Focus:  Goals Group:   The focus of this group is to help patients establish daily goals to achieve during treatment and discuss how the patient can incorporate goal setting into their daily lives to aide in recovery.    Participation Level:  Active  Participation Quality:  Appropriate  Affect:  Appropriate   Jerry [name] 07/17/2023, 8:55 AM

## 2023-07-17 NOTE — Progress Notes (Signed)
 Social worker spoke with patient regarding discharge plan. Patient reported wanting to be discharged with a bus pass to [facility] on 07/18/23. Will speak to pt tomorrow regarding possible taxi should he not want to take the bus.

## 2023-07-17 NOTE — Progress Notes (Signed)
Pt did not attend wrap-up group   

## 2023-07-17 NOTE — Progress Notes (Signed)
   07/16/23 2100  Psych Admission Type (Psych Patients Only)  Admission Status Voluntary  Psychosocial Assessment  Patient Complaints Anxiety  Eye Contact Brief  Facial Expression Anxious  Affect Appropriate to circumstance  Speech Soft  Interaction Isolative  Motor Activity Slow  Appearance/Hygiene Disheveled  Behavior Characteristics Cooperative  Mood Anxious  Thought Process  Coherency WDL  Content WDL  Delusions None reported or observed  Perception WDL  Hallucination None reported or observed  Judgment Poor  Confusion None  Danger to Self  Current suicidal ideation? Denies  Agreement Not to Harm Self Yes  Description of Agreement Contracts for safety  Danger to Others  Danger to Others None reported or observed

## 2023-07-17 NOTE — Group Note (Signed)
 LCSW Group Therapy Note   Group Date: 07/17/2023 Start Time: 1100 End Time: 1200  Participation:  patient attended 50% of the group session.  He was listening, however didn't participate in the conversation.  Type of Therapy:  Group Therapy  Topic:  "Finding Balance: Using Wise Mind for Thoughtful Decisions"  Objective:  the objective of this class is to help participants understand the concept of Wise Mind and learn how to apply it to real-life situations to make balanced, thoughtful decisions. Participants will gain tools to manage emotions, consider logic, and find a middle ground that leads to healthier responses and outcomes.  Goals: Understand the concept of Wise Mind.  Participants will learn the difference between Emotional Mind, Reasonable Mind, and Agustina Aldrich Mind, and how Agustina Aldrich Mind helps in balancing emotions and logic to make thoughtful decisions. Recognize when you're in Emotional Mind or Reasonable Mind.  Participants will identify the signs of Emotional Mind and Reasonable Mind in their own reactions to situations and understand how to move into Wise Mind for more balanced responses. Practice applying Agustina Aldrich Mind to real-life situations.  Through scenarios and group activities, participants will practice using Wise Mind in everyday situations, learning how to acknowledge their emotions, think logically, and create solutions that are thoughtful and balanced.  Summary: In this class, we explored the concept of Wise Mind--the balance between Emotional Mind and Reasonable Mind. We discussed how Emotional Mind can sometimes lead to impulsive, reactive decisions driven by intense feelings, and how Reasonable Mind might ignore feelings altogether, focusing only on facts and logic. Agustina Aldrich Mind is the middle ground that combines both, allowing you to consider your emotions and use logic to make balanced, thoughtful decisions.  We learned how to recognize when we're in Emotional Mind or Reasonable  Mind, and practiced using Wise Mind in real-life situations. By using [place]ola [place], we can improve how we handle challenging situations, make better decisions, and strengthen our relationships with others.  Therapeutic Modalities: Elements of DBT - emotional regulation   Jheri Mitter O Anelly Samarin, LCSWA 07/17/2023  1:07 PM

## 2023-07-17 NOTE — Progress Notes (Signed)
   07/17/23 1000  Psych Admission Type (Psych Patients Only)  Admission Status Voluntary  Psychosocial Assessment  Patient Complaints Anxiety  Eye Contact Brief  Facial Expression Anxious  Affect Appropriate to circumstance  Speech Soft  Interaction Assertive  Motor Activity Slow  Appearance/Hygiene Disheveled  Behavior Characteristics Appropriate to situation  Mood Depressed;Anxious  Thought Process  Coherency WDL  Content WDL  Delusions None reported or observed  Perception WDL  Hallucination None reported or observed  Judgment Poor  Confusion None  Danger to Self  Current suicidal ideation? Denies  Self-Injurious Behavior No self-injurious ideation or behavior indicators observed or expressed   Agreement Not to Harm Self Yes  Description of Agreement verbal  Danger to Others  Danger to Others None reported or observed

## 2023-07-17 NOTE — Plan of Care (Signed)
   Problem: Coping: Goal: Ability to verbalize frustrations and anger appropriately will improve Outcome: Progressing   Problem: Safety: Goal: Periods of time without injury will increase Outcome: Progressing

## 2023-07-17 NOTE — Progress Notes (Signed)
 [facility] MD Progress Note  07/17/2023 5:09 PM Jerry [name]  MRN:  [id number]  Principal Problem: Schizophrenia [facility]) Diagnosis: Principal Problem:   Schizophrenia (HCC)  Yesterday the psychiatry team made the following recommendations: -Continue Haldol  5 mg BID for psychosis -Will consider starting antidepressant once more stable -Continue Agitation Protocol: Haldol /Ativan /Benadryl  24-hour chart review: Vital signs without critical values.  Patient compliant with his scheduled psychotropic medications.  Requires as needed hydroxyzine  x 2 for anxiety.  CIWA score of 1 due to, anxiety.  Today's assessment notes: On assessment today, the pt reports that his mood less depressed and much improved.  Rates depression as #2/10 with 10 being high severity.  During assessment patient appears to be responding to internal and external stimuli.  Speech slightly improved and with less mumbling.  Able to respond to assessment questions and facing this provider.  CIWA score as noted above.  Complaint of anxiety, medicated as needed by nursing staff.  Denies nausea or vomiting.  Patient overall mental status and mood improving.  We will plan for discharge possibly tomorrow 07/18/23. Reports that anxiety is rated at #2/10 with 10 being the worst. Nursing staff report patient sleeping over 8.75 hours last night.   Appetite is good Concentration is fair Energy level is adequate Denies suicidal thoughts.  Further denies suicidal intent and plan.  Denies having any HI.  Denies having psychotic symptoms, however appears to be responding to internal or external stimuli.  Denies having side effects to current psychiatric medications.   We discussed compliance to current medication regimen.  Discussed the following psychosocial stressors: Of substance use disorder, empathy, active listening and emotional support provided.  Total Time spent with patient: 45 minutes  Past Psychiatric History: Schizophrenia,  Depression, PTSD, and Polysubstance Abuse (EtOH, Cocaine, Meth, THC, Barbiturates), and multiple Psychiatric Hospitalizations (last [facility] 06/2023).   Past Medical History:  Past Medical History:  Diagnosis Date   Anxiety    Bipolar 1 disorder (HCC)    Depression    Drug-induced seizure (HCC)    EtOH dependence (HCC)    Hallucination    Paranoid schizophrenia (HCC)    since pt's 20's   PTSD (post-traumatic stress disorder)     Past Surgical History:  Procedure Laterality Date   KIDNEY DONATION Right 2003   Family History:  Family History  Problem Relation Age of Onset   Heart disease Mother    Cancer Mother    Lung cancer Mother    Heart disease Father    Diabetes Father    Diabetes Maternal Grandmother    Stroke Paternal Grandfather    Family Psychiatric  History: See H&P Social History:  Social History   Substance and Sexual Activity  Alcohol Use Yes   Comment: Decreased from 12 cans of beer and 20 shots of liquor about 2 months ago     Social History   Substance and Sexual Activity  Drug Use Yes   Types: Cocaine, Heroin, Amphetamines, Methamphetamines, Marijuana   Comment: last drug use a few days ago    Social History   Socioeconomic History   Marital status: Married    Spouse name: Not on file   Number of children: Not on file   Years of education: Not on file   Highest education level: Not on file  Occupational History   Not on file  Tobacco Use   Smoking status: Every Day    Current packs/day: 1.00    Types: Cigarettes   Smokeless tobacco:  Never  Vaping Use   Vaping status: Never Used  Substance and Sexual Activity   Alcohol use: Yes    Comment: Decreased from 12 cans of beer and 20 shots of liquor about 2 months ago   Drug use: Yes    Types: Cocaine, Heroin, Amphetamines, Methamphetamines, Marijuana    Comment: last drug use a few days ago   Sexual activity: Yes  Other Topics Concern   Not on file  Social History Narrative   Lives alone  currently   Social Drivers of Health   Financial Resource Strain: Not on file  Food Insecurity: Patient Unable To Answer (07/14/2023)   Hunger Vital Sign    Worried About Running Out of Food in the Last Year: Patient unable to answer    Ran Out of Food in the Last Year: Patient unable to answer  Recent Concern: Food Insecurity - Food Insecurity Present (07/01/2023)   Hunger Vital Sign    Worried About Running Out of Food in the Last Year: Sometimes true    Ran Out of Food in the Last Year: Sometimes true  Transportation Needs: Patient Declined (07/14/2023)   PRAPARE - Administrator, Civil Service (Medical): Patient declined    Lack of Transportation (Non-Medical): Patient declined  Recent Concern: Transportation Needs - Unmet Transportation Needs (07/01/2023)   PRAPARE - Administrator, Civil Service (Medical): Yes    Lack of Transportation (Non-Medical): Yes  Physical Activity: Not on file  Stress: Not on file  Social Connections: Socially Isolated (07/01/2023)   Social Connection and Isolation Panel [NHANES]    Frequency of Communication with Friends and Family: Never    Frequency of Social Gatherings with Friends and Family: Never    Attends Religious Services: Never    Database administrator or Organizations: No    Attends Engineer, structural: Never    Marital Status: Separated   Additional Social History:    Sleep: Good  Appetite:  Good  Current Medications: Current Facility-Administered Medications  Medication Dose Route Frequency Provider Last Rate Last Admin   acetaminophen  (TYLENOL ) tablet 650 mg  650 mg Oral Q6H PRN Motley-Mangrum, Jadeka A, PMHNP       alum & mag hydroxide-simeth (MAALOX/MYLANTA) 200-200-20 MG/5ML suspension 30 mL  30 mL Oral Q4H PRN Motley-Mangrum, Jadeka A, PMHNP       haloperidol  (HALDOL ) tablet 5 mg  5 mg Oral TID PRN Motley-Mangrum, Jadeka A, PMHNP       And   diphenhydrAMINE (BENADRYL) capsule 50 mg  50 mg Oral  TID PRN Motley-Mangrum, Jadeka A, PMHNP       haloperidol  lactate (HALDOL ) injection 5 mg  5 mg Intramuscular TID PRN Motley-Mangrum, Jadeka A, PMHNP       And   diphenhydrAMINE (BENADRYL) injection 50 mg  50 mg Intramuscular TID PRN Motley-Mangrum, Jadeka A, PMHNP       And   LORazepam  (ATIVAN ) injection 2 mg  2 mg Intramuscular TID PRN Motley-Mangrum, Jadeka A, PMHNP       haloperidol  lactate (HALDOL ) injection 10 mg  10 mg Intramuscular TID PRN Motley-Mangrum, Jadeka A, PMHNP       And   diphenhydrAMINE (BENADRYL) injection 50 mg  50 mg Intramuscular TID PRN Motley-Mangrum, Jadeka A, PMHNP       And   LORazepam  (ATIVAN ) injection 2 mg  2 mg Intramuscular TID PRN Motley-Mangrum, Jadeka A, PMHNP       doxycycline  (VIBRA -TABS) tablet 100 mg  100 mg Oral Q12H Motley-Mangrum, Jadeka A, PMHNP   100 mg at 07/17/23 0817   feeding supplement (ENSURE ENLIVE / ENSURE PLUS) liquid 237 mL  237 mL Oral BID BM Zouev, Dmitri, MD   237 mL at 07/17/23 1506   haloperidol  (HALDOL ) tablet 5 mg  5 mg Oral BID Motley-Mangrum, Jadeka A, PMHNP   5 mg at 07/17/23 1611   hydrOXYzine  (ATARAX ) tablet 25 mg  25 mg Oral TID PRN Motley-Mangrum, Jadeka A, PMHNP   25 mg at 07/17/23 1507   loperamide  (IMODIUM ) capsule 2-4 mg  2-4 mg Oral PRN Pashayan, Alexander S, MD       [START ON 07/18/2023] LORazepam  (ATIVAN ) tablet 0.5 mg  0.5 mg Oral Q0600 Pashayan, Knute Perla, MD       LORazepam  (ATIVAN ) tablet 1 mg  1 mg Oral Q6H PRN Pashayan, Alexander S, MD       magnesium  hydroxide (MILK OF MAGNESIA) suspension 30 mL  30 mL Oral Daily PRN Motley-Mangrum, Jadeka A, PMHNP       ondansetron  (ZOFRAN -ODT) disintegrating tablet 4 mg  4 mg Oral Q6H PRN Basilia Bosworth, MD       thiamine  (Vitamin B-1) tablet 100 mg  100 mg Oral Daily Motley-Mangrum, Jadeka A, PMHNP   100 mg at 07/17/23 0865   Or   thiamine  (VITAMIN B1) injection 100 mg  100 mg Intravenous Daily Motley-Mangrum, Jadeka A, PMHNP       Lab Results: No results found for  this or any previous visit (from the past 48 hours).  Blood Alcohol level:  Lab Results  Component Value Date   [facility] <15 07/13/2023   ETH <15 07/11/2023   Metabolic Disorder Labs: Lab Results  Component Value Date   HGBA1C 5.3 07/09/2022   MPG 105.41 07/09/2022   MPG 111.15 10/31/2020   No results found for: "PROLACTIN" Lab Results  Component Value Date   CHOL 162 07/09/2022   TRIG 86 07/09/2022   HDL 67 07/09/2022   CHOLHDL 2.4 07/09/2022   VLDL 17 07/09/2022   LDLCALC 78 07/09/2022   LDLCALC 82 10/31/2020   Physical Findings: AIMS:  , ,  ,  ,    CIWA:  CIWA-Ar Total: 1 COWS:     Musculoskeletal: Strength & Muscle Tone: within normal limits Gait & Station: normal Patient leans: N/A  Psychiatric Specialty Exam:  Presentation  General Appearance:  Casual  Eye Contact: None  Speech: Garbled  Speech Volume: Decreased (Improved)  Handedness: Right  Mood and Affect  Mood: Anxious; Depressed  Affect: Congruent  Thought Process  Thought Processes: Linear (Improving)  Descriptions of Associations:Loose  Orientation:Partial  Thought Content:Illogical  History of Schizophrenia/Schizoaffective disorder:Yes  Duration of Psychotic Symptoms:Greater than six months  Hallucinations:Hallucinations: -- (Denies) Description of Auditory Hallucinations: -- (Denies)  Ideas of Reference:Paranoia  Suicidal Thoughts:Suicidal Thoughts: No  Homicidal Thoughts:Homicidal Thoughts: No  Sensorium  Memory: Immediate Fair; Recent Fair  Judgment: Fair  Insight: Shallow  Executive Functions  Concentration: Fair  Attention Span: Fair  Recall: [employer] of Knowledge: Fair  Language: Fair  Psychomotor Activity  Psychomotor Activity: Psychomotor Activity: Normal  Assets  Assets: Communication Skills; Physical Health; Resilience  Sleep  Sleep: Sleep: Good Number of Hours of Sleep: 8.75  Physical Exam: Physical Exam Vitals and  nursing note reviewed.  Constitutional:      General: He is not in acute distress.    Appearance: He is not toxic-appearing.  HENT:     Head: Normocephalic.  Right Ear: External ear normal.     Nose: Nose normal.     Mouth/Throat:     Mouth: Mucous membranes are moist.     Pharynx: Oropharynx is clear.  Eyes:     Extraocular Movements: Extraocular movements intact.  Cardiovascular:     Rate and Rhythm: Normal rate.     Pulses: Normal pulses.  Pulmonary:     Effort: Pulmonary effort is normal.  Abdominal:     Comments: Deferred  Genitourinary:    Comments: Deferred Musculoskeletal:        General: Normal range of motion.     Cervical back: Normal range of motion.  Skin:    General: Skin is warm.  Neurological:     General: No focal deficit present.     Mental Status: He is alert and oriented to person, place, and time.  Psychiatric:        Mood and Affect: Mood normal.        Behavior: Behavior normal.     Comments: Eye contact, thought content, and judgment are poor.    Review of Systems  Constitutional:  Negative for chills and fever.  HENT:  Negative for sore throat.   Eyes:  Negative for blurred vision.  Respiratory:  Negative for cough, sputum production, shortness of breath and wheezing.   Cardiovascular:  Negative for chest pain and palpitations.  Gastrointestinal:  Negative for heartburn and nausea.  Genitourinary:  Negative for dysuria, frequency and urgency.  Musculoskeletal:  Negative for falls.  Skin:  Negative for itching and rash.  Neurological:  Positive for tremors and headaches. Negative for dizziness.       Nursing staff document that she was score of 3.  Due to headache, anxiety, and tremor.  Endo/Heme/Allergies:        See allergy listing  Psychiatric/Behavioral:  Positive for depression and hallucinations. Negative for substance abuse and suicidal ideas. The patient is nervous/anxious. The patient does not have insomnia.    Blood pressure  104/74, pulse 90, temperature 98.6 F (37 C), temperature source Oral, resp. rate 16, height 5\' 9"  (1.753 m), weight 71.7 kg, SpO2 100%. Body mass index is 23.33 kg/m.   Treatment Plan Summary: Daily contact with patient to assess and evaluate symptoms and progress in treatment and Medication management   Leam Saye is a 48 yr old male who presented on 4/23 to [facility] with complaints of paranoia, he was admitted to [facility] on 4/27.  PPHx is significant for Schizophrenia, Depression, PTSD, and Polysubstance Abuse (EtOH, Cocaine, Meth, THC, Barbiturates), and multiple Psychiatric Hospitalizations (last [facility] 06/2023).   Dekevion has a well documented history of Schizophrenia and substance abuse. He is currently disoriented and unable to fully interview.  As he was already started on Haldol  we will continue with this.   We will continue with the CIWA and PRN Ativan  given his continued elevated CIWA.  Given his history of withdrawal seizures we will also order a quick taper of ativan .   Schizophrenia: -Continue Haldol  5 mg BID for psychosis -Will consider starting antidepressant once more stable -Continue Agitation Protocol: Haldol /Ativan Harlee Lichtenstein   Withdrawal: -Continue CIWA, last score @CIWA @ -Continue Ativan  1 mg q6 PRN CIWA>10 -Continue Ativan  taper to end 4/30 -Continue Imodium  2-4 mg PRN diarrhea -Continue Zofran -ODT 4 mg q6 PRN nausea -Continue Thiamine  100 mg daily for nutritional supplementation   -Continue Doxycycline  100 mg BID -Continue Ensure 237 mL BID -Continue PRN's: Tylenol , Maalox, Atarax , Milk of Magnesia   Observation Level/Precautions:  15 minute checks  Laboratory:  CMP:WNL except Na: 131,  Albu: 3.3, ALT: 45,  CBC: WNL except RBC: 3.81,  Hem: 12.6,  HCT: 37.8,  Plat: 418,  Eosin Abs: 0.6,  UDS: Benzo, Amphet, THC, Barbiturates positive,  EKG: Sinus Rhythm w/ marked Sinus Arrhythmia w/ Qtc: 481.  Psychotherapy:    Medications:  Haldol , Ativan   Consultations:     Discharge Concerns:    Estimated LOS: 5-7 days  Other:      Physician Treatment Plan for Primary Diagnosis: Schizophrenia (HCC) Long Term Goal(s): Improvement in symptoms so as ready for discharge   Short Term Goals: Ability to identify changes in lifestyle to reduce recurrence of condition will improve, Ability to verbalize feelings will improve, Ability to demonstrate self-control will improve, Ability to identify and develop effective coping behaviors will improve, Ability to maintain clinical measurements within normal limits will improve, and Ability to identify triggers associated with substance abuse/mental health issues will improve   Physician Treatment Plan for Secondary Diagnosis: Principal Problem:   Schizophrenia (HCC)   Long Term Goal(s): Improvement in symptoms so as ready for discharge   Short Term Goals: Ability to identify changes in lifestyle to reduce recurrence of condition will improve, Ability to verbalize feelings will improve, Ability to demonstrate self-control will improve, Ability to identify and develop effective coping behaviors will improve, Ability to maintain clinical measurements within normal limits will improve, and Ability to identify triggers associated with substance abuse/mental health issues will improve   I certify that inpatient services furnished can reasonably be expected to improve the patient's condition.     Laurence Pons, FNP 07/17/2023, 5:09 PM Patient ID: Jerry [name], male   DOB: [birth year], 48 y.o.   MRN: [id number]

## 2023-07-17 NOTE — Plan of Care (Signed)

## 2023-07-18 DIAGNOSIS — F200 Paranoid schizophrenia: Secondary | ICD-10-CM | POA: Diagnosis not present

## 2023-07-18 MED ORDER — TRAZODONE HCL 50 MG PO TABS
50.0000 mg | ORAL_TABLET | Freq: Every evening | ORAL | Status: DC | PRN
Start: 2023-07-18 — End: 2023-07-19
  Administered 2023-07-18: 50 mg via ORAL

## 2023-07-18 NOTE — BHH Group Notes (Signed)
 Psychoeducational Group Note  Date:  07/18/2023 Time:  2000  Group Topic/Focus:  Narcotics Anonymous Meeting  Participation Level: Did Not Attend  Participation Quality:  Not Applicable  Affect:  Not Applicable  Cognitive:  Not Applicable  Insight:  Not Applicable  Engagement in Group: Not Applicable  Additional Comments:  Did not attend.   Catharine Clock 07/18/2023, 9:47 PM

## 2023-07-18 NOTE — Group Note (Signed)
 Recreation Therapy Group Note   Group Topic:Team Building  Group Date: 07/18/2023 Start Time: 1610 End Time: 1000 Facilitators: Brazil Voytko-McCall, LRT,CTRS Location: [address] Dayroom   Group Topic: Communication, Team Building, Problem Solving  Goal Area(s) Addresses:  Patient will effectively work with peer towards shared goal.  Patient will identify skills used to make activity successful.  Patient will identify how skills used during activity can be applied to reach post d/c goals.   Intervention: STEM Activity- Glass blower/designer  Activity: Tallest [employer]. In teams of 5-6, patients were given 11 craft pipe cleaners. Using the materials provided, patients were instructed to compete again the opposing team(s) to build the tallest free-standing structure from floor level. The activity was timed; difficulty increased by Clinical research associate as Production designer, theatre/television/film continued.  Systematically resources were removed with additional directions for example, placing one arm behind their back, working in silence, and shape stipulations. LRT facilitated post-activity discussion reviewing team processes and necessary communication skills involved in completion. Patients were encouraged to reflect how the skills utilized, or not utilized, in this activity can be incorporated to positively impact support systems post discharge.  Education: Pharmacist, community, Scientist, physiological, Discharge Planning   Education Outcome: Acknowledges education/In group clarification offered/Needs additional education.    Affect/Mood: N/A   Participation Level: Did not attend    Clinical Observations/Individualized Feedback:    Plan: Continue to engage patient in RT group sessions 2-3x/week.   Cynethia Schindler-McCall, LRT,CTRS  07/18/2023 11:42 AM

## 2023-07-18 NOTE — Group Note (Signed)
 Date:  07/18/2023 Time:  4:50 PM  Group Topic/Focus:  Recovery Goals:   The focus of this group is to promote emotional wellness by identifying unhealthy thought patterns and how to challenge them.    Participation Level:  Did Not Attend   Sheryl Donna 07/18/2023, 4:50 PM

## 2023-07-18 NOTE — Group Note (Signed)
 Date:  07/18/2023 Time:  4:41 PM  Group Topic/Focus:  Goals Group:   The focus of this group is to help patients establish daily goals to achieve during treatment and discuss how the patient can incorporate goal setting into their daily lives to aide in recovery. Orientation:   The focus of this group is to educate the patient on the purpose and policies of crisis stabilization and provide a format to answer questions about their admission.  The group details unit policies and expectations of patients while admitted.    Participation Level:  Did Not Attend   Sheryl Donna 07/18/2023, 4:41 PM

## 2023-07-18 NOTE — Plan of Care (Signed)
 Pt verbalized he was prepared for discharge.  Pt denied SI/HI/AVH.

## 2023-07-18 NOTE — Progress Notes (Signed)
 D:  Patient denied SI and HI, contracts for safety.  Denied A/V hallucinations.  Denied pain. A:  Medications administered per MD orders.  Emotional support and encouragement given patient. R:  Safety maintained with 15 minute checks.

## 2023-07-18 NOTE — Progress Notes (Deleted)
  [facility] Adult Case Management Discharge Plan :  Will you be returning to the same living situation after discharge:  Yes,  patient will be discharging to [facility] located at [address] [place], [place] [address] via taxi voucher.  At discharge, do you have transportation home?: Yes,  pt will be discharging via taxi voucher at 12pm.  Do you have the ability to pay for your medications: Yes,  patient is insured - [employer] tailored plan.   Release of information consent forms completed and in the chart;  Patient's signature needed at discharge.  Patient to Follow up at:  Follow-up Information     [place] [place] Go on 07/26/2023.   Specialty: Professional Counselor Why: Please go to this provider on 07/26/23 at 9:00 am for an assessment, to obtain an appointment for therapy services. You may also go on Monday through Friday, from 9 am to 1 pm. Contact information: [address]  [address] [place] [address] [phone]         [facility]. Go on 07/25/2023.   Specialty: Behavioral Health Why: Please go to this provider on 07/25/23 at 7:00 am for an assessment, to obtain medication management services.  You may also go Monday through Friday at  7:00 am.  You must have a telephone number for an actual appointment. Contact information: [address] [place]  [address] [phone]                Next level of care provider has access to [facility] Link:no  Safety Planning and Suicide Prevention discussed: Yes,  discussed with patient. Patient declined to have ROI completed and stated he had no one to put down.      Has patient been referred to the Quitline?: Patient refused referral for treatment  Patient has been referred for addiction treatment: Patient refused referral for treatment.  Carmella Kees M Corby Villasenor, LCSWA 07/18/2023, 9:57 AM

## 2023-07-18 NOTE — Transportation (Signed)
 07/18/2023  Jerry [name] DOB: 1976/01/08 MRN: [id number]   RIDER WAIVER AND RELEASE OF LIABILITY  For the purposes of helping with transportation needs, [place] partners with outside transportation providers (taxi companies, [place], Catering manager.) to give [place] patients or other approved people the choice of on-demand rides [employer]") to our buildings for non-emergency visits.  By using [employer], I, the person signing this document, on behalf of myself and/or any legal minors (in my care using the [employer]), agree:  Science writer given to me are supplied by independent, outside transportation providers who do not work for, or have any affiliation with, [employer]. [place] is not a transportation company. [place] has no control over the quality or safety of the rides I get using [employer]. [place] has no control over whether any outside ride will happen on time or not. [place] gives no guarantee on the reliability, quality, safety, or availability on any rides, or that no mistakes will happen. I know and accept that traveling by vehicle (car, truck, SVU, [place], bus, taxi, etc.) has risks of serious injuries such as disability, being paralyzed, and death. I know and agree the risk of using [employer] is mine alone, and not [employer]. Transport Services are provided "as is" and as are available. The transportation providers are in charge for all inspections and care of the vehicles used to provide these rides. I agree not to take legal action against [place], its agents, employees, officers, directors, representatives, insurers, attorneys, assigns, successors, subsidiaries, and affiliates at any time for any reasons related directly or indirectly to using [employer]. I also agree not to take legal action against [place] or its affiliates for any injury, death, or damage to property caused by or related to using  [employer]. I have read this Waiver and Release of Liability, and I understand the terms used in it and their legal meaning. This Waiver is freely and voluntarily given with the understanding that my right (or any legal minors) to legal action against [place] relating to [employer] is knowingly given up to use these services.   I attest that I read the Ride Waiver and Release of Liability to Jerry [name], gave Jerry [name] the opportunity to ask questions and answered the questions asked (if any). I affirm that Jerry [name] then provided consent for assistance with transportation.    Aino Heckert, LCSWA 11:54AM

## 2023-07-18 NOTE — Progress Notes (Signed)
 [facility] MD Progress Note  07/18/2023 3:20 PM Jerry [name]  MRN:  [id number]  Principal Problem: Schizophrenia [facility]) Diagnosis: Principal Problem:   Schizophrenia (HCC)  Yesterday the psychiatry team made the following recommendations: -Continue Haldol  5 mg BID for psychosis -Will consider starting antidepressant once more stable -Continue Agitation Protocol: Haldol /Ativan /Benadryl  24-hour chart review: Vital signs without critical values.  Patient compliant with his scheduled psychotropic medications.  Requires as needed hydroxyzine  x 1 for anxiety.  CIWA score of 0.   Today's assessment notes: On assessment today, the pt reports that his mood is euthymic, improved since admission, and stable. Denies feeling down, depressed, or sad.  Reports that anxiety symptoms are at manageable level.  Sleep is stable. Appetite is stable.  Concentration is without complaint.  Energy level is adequate. Denies having any suicidal thoughts. Denies having any suicidal intent and plan.  Denies having any HI.  Denies having psychotic symptoms.   Denies having side effects to current psychiatric medications.   Discussed discharge planning:  How to identify the signs of impending crisis, use of internal coping strategies, reaching out to friends and family that can help navigate a crisis, and a list of mental health professionals and agencies to call. Further to follow up on her mental health appointments and her PCP appointments.   We will plan for discharge possibly tomorrow 07/19/23.  We discussed compliance to current medication regimen.  Discussed the following psychosocial stressors: Of substance use disorder, empathy, active listening and emotional support provided.  Total Time spent with patient: 45 minutes  Past Psychiatric History: Schizophrenia, Depression, PTSD, and Polysubstance Abuse (EtOH, Cocaine, Meth, THC, Barbiturates), and multiple Psychiatric Hospitalizations (last [facility]  06/2023).   Past Medical History:  Past Medical History:  Diagnosis Date   Anxiety    Bipolar 1 disorder (HCC)    Depression    Drug-induced seizure (HCC)    EtOH dependence (HCC)    Hallucination    Paranoid schizophrenia (HCC)    since pt's 20's   PTSD (post-traumatic stress disorder)     Past Surgical History:  Procedure Laterality Date   KIDNEY DONATION Right 2003   Family History:  Family History  Problem Relation Age of Onset   Heart disease Mother    Cancer Mother    Lung cancer Mother    Heart disease Father    Diabetes Father    Diabetes Maternal Grandmother    Stroke Paternal Grandfather    Family Psychiatric  History: See H&P Social History:  Social History   Substance and Sexual Activity  Alcohol Use Yes   Comment: Decreased from 12 cans of beer and 20 shots of liquor about 2 months ago     Social History   Substance and Sexual Activity  Drug Use Yes   Types: Cocaine, Heroin, Amphetamines, Methamphetamines, Marijuana   Comment: last drug use a few days ago    Social History   Socioeconomic History   Marital status: Married    Spouse name: Not on file   Number of children: Not on file   Years of education: Not on file   Highest education level: Not on file  Occupational History   Not on file  Tobacco Use   Smoking status: Every Day    Current packs/day: 1.00    Types: Cigarettes   Smokeless tobacco: Never  Vaping Use   Vaping status: Never Used  Substance and Sexual Activity   Alcohol use: Yes    Comment: Decreased  from 12 cans of beer and 20 shots of liquor about 2 months ago   Drug use: Yes    Types: Cocaine, Heroin, Amphetamines, Methamphetamines, Marijuana    Comment: last drug use a few days ago   Sexual activity: Yes  Other Topics Concern   Not on file  Social History Narrative   Lives alone currently   Social Drivers of Health   Financial Resource Strain: Not on file  Food Insecurity: Patient Unable To Answer (07/14/2023)    Hunger Vital Sign    Worried About Running Out of Food in the Last Year: Patient unable to answer    Ran Out of Food in the Last Year: Patient unable to answer  Recent Concern: Food Insecurity - Food Insecurity Present (07/01/2023)   Hunger Vital Sign    Worried About Running Out of Food in the Last Year: Sometimes true    Ran Out of Food in the Last Year: Sometimes true  Transportation Needs: Patient Declined (07/14/2023)   PRAPARE - Administrator, Civil Service (Medical): Patient declined    Lack of Transportation (Non-Medical): Patient declined  Recent Concern: Transportation Needs - Unmet Transportation Needs (07/01/2023)   PRAPARE - Administrator, Civil Service (Medical): Yes    Lack of Transportation (Non-Medical): Yes  Physical Activity: Not on file  Stress: Not on file  Social Connections: Socially Isolated (07/01/2023)   Social Connection and Isolation Panel [NHANES]    Frequency of Communication with Friends and Family: Never    Frequency of Social Gatherings with Friends and Family: Never    Attends Religious Services: Never    Database administrator or Organizations: No    Attends Engineer, structural: Never    Marital Status: Separated   Additional Social History:  Horticulturist, commercial returned: Civil Service fast streamer, toiletries, misc items etc Sleep: Good  Appetite:  Good  Current Medications: Current Facility-Administered Medications  Medication Dose Route Frequency Provider Last Rate Last Admin   acetaminophen  (TYLENOL ) tablet 650 mg  650 mg Oral Q6H PRN Motley-Mangrum, Jadeka A, PMHNP       alum & mag hydroxide-simeth (MAALOX/MYLANTA) 200-200-20 MG/5ML suspension 30 mL  30 mL Oral Q4H PRN Motley-Mangrum, Jadeka A, PMHNP       haloperidol  (HALDOL ) tablet 5 mg  5 mg Oral TID PRN Motley-Mangrum, Jadeka A, PMHNP       And   diphenhydrAMINE (BENADRYL) capsule 50 mg  50 mg Oral TID PRN Motley-Mangrum, Jadeka A, PMHNP       haloperidol  lactate (HALDOL )  injection 5 mg  5 mg Intramuscular TID PRN Motley-Mangrum, Jadeka A, PMHNP       And   diphenhydrAMINE (BENADRYL) injection 50 mg  50 mg Intramuscular TID PRN Motley-Mangrum, Jadeka A, PMHNP       And   LORazepam  (ATIVAN ) injection 2 mg  2 mg Intramuscular TID PRN Motley-Mangrum, Jadeka A, PMHNP       haloperidol  lactate (HALDOL ) injection 10 mg  10 mg Intramuscular TID PRN Motley-Mangrum, Jadeka A, PMHNP       And   diphenhydrAMINE (BENADRYL) injection 50 mg  50 mg Intramuscular TID PRN Motley-Mangrum, Jadeka A, PMHNP       And   LORazepam  (ATIVAN ) injection 2 mg  2 mg Intramuscular TID PRN Motley-Mangrum, Jadeka A, PMHNP       doxycycline  (VIBRA -TABS) tablet 100 mg  100 mg Oral Q12H Motley-Mangrum, Jadeka A, PMHNP   100 mg at 07/18/23 0744   feeding  supplement (ENSURE ENLIVE / ENSURE PLUS) liquid 237 mL  237 mL Oral BID BM Zouev, Dmitri, MD   237 mL at 07/18/23 1434   haloperidol  (HALDOL ) tablet 5 mg  5 mg Oral BID Motley-Mangrum, Jadeka A, PMHNP   5 mg at 07/18/23 0745   hydrOXYzine  (ATARAX ) tablet 25 mg  25 mg Oral TID PRN Motley-Mangrum, Jadeka A, PMHNP   25 mg at 07/17/23 2105   magnesium  hydroxide (MILK OF MAGNESIA) suspension 30 mL  30 mL Oral Daily PRN Motley-Mangrum, Jadeka A, PMHNP       thiamine  (Vitamin B-1) tablet 100 mg  100 mg Oral Daily Motley-Mangrum, Jadeka A, PMHNP   100 mg at 07/18/23 0746   Or   thiamine  (VITAMIN B1) injection 100 mg  100 mg Intravenous Daily Motley-Mangrum, Jadeka A, PMHNP       Lab Results: No results found for this or any previous visit (from the past 48 hours).  Blood Alcohol level:  Lab Results  Component Value Date   [facility] <15 07/13/2023   ETH <15 07/11/2023   Metabolic Disorder Labs: Lab Results  Component Value Date   HGBA1C 5.3 07/09/2022   MPG 105.41 07/09/2022   MPG 111.15 10/31/2020   No results found for: "PROLACTIN" Lab Results  Component Value Date   CHOL 162 07/09/2022   TRIG 86 07/09/2022   HDL 67 07/09/2022   CHOLHDL 2.4  07/09/2022   VLDL 17 07/09/2022   LDLCALC 78 07/09/2022   LDLCALC 82 10/31/2020   Physical Findings: AIMS:  , ,  ,  ,    CIWA:  CIWA-Ar Total: 0 COWS:     Musculoskeletal: Strength & Muscle Tone: within normal limits Gait & Station: normal Patient leans: N/A  Psychiatric Specialty Exam:  Presentation  General Appearance:  Casual  Eye Contact: Fair  Speech: Garbled  Speech Volume: Decreased  Handedness: Right  Mood and Affect  Mood: Anxious  Affect: Congruent  Thought Process  Thought Processes: Linear  Descriptions of Associations:Loose  Orientation:Full (Time, Place and Person)  Thought Content:Rumination  History of Schizophrenia/Schizoaffective disorder:Yes  Duration of Psychotic Symptoms:Greater than six months  Hallucinations:Hallucinations: None Description of Auditory Hallucinations: none  Ideas of Reference:Paranoia  Suicidal Thoughts:Suicidal Thoughts: No  Homicidal Thoughts:Homicidal Thoughts: No  Sensorium  Memory: Immediate Fair  Judgment: Fair  Insight: Shallow  Executive Functions  Concentration: Fair  Attention Span: Fair  Recall: [employer] of Knowledge: Fair  Language: Fair  Psychomotor Activity  Psychomotor Activity: Psychomotor Activity: Normal  Assets  Assets: Physical Health; Resilience  Sleep  Sleep: Sleep: Good Number of Hours of Sleep: 5  Physical Exam: Physical Exam Vitals and nursing note reviewed.  Constitutional:      General: He is not in acute distress.    Appearance: He is not toxic-appearing.  HENT:     Head: Normocephalic.     Right Ear: External ear normal.     Nose: Nose normal.     Mouth/Throat:     Mouth: Mucous membranes are moist.     Pharynx: Oropharynx is clear.  Eyes:     Extraocular Movements: Extraocular movements intact.  Cardiovascular:     Rate and Rhythm: Normal rate.     Pulses: Normal pulses.  Pulmonary:     Effort: Pulmonary effort is normal.   Abdominal:     Comments: Deferred  Genitourinary:    Comments: Deferred Musculoskeletal:        General: Normal range of motion.  Cervical back: Normal range of motion.  Skin:    General: Skin is warm.  Neurological:     General: No focal deficit present.     Mental Status: He is alert and oriented to person, place, and time.  Psychiatric:        Mood and Affect: Mood normal.        Behavior: Behavior normal.     Comments: Eye contact, thought content, and judgment are poor.    Review of Systems  Constitutional:  Negative for chills and fever.  HENT:  Negative for sore throat.   Eyes:  Negative for blurred vision.  Respiratory:  Negative for cough, sputum production, shortness of breath and wheezing.   Cardiovascular:  Negative for chest pain and palpitations.  Gastrointestinal:  Negative for heartburn and nausea.  Genitourinary:  Negative for dysuria, frequency and urgency.  Musculoskeletal:  Negative for falls.  Skin:  Negative for itching and rash.  Neurological:  Positive for tremors and headaches. Negative for dizziness.       Nursing staff document that she was score of 3.  Due to headache, anxiety, and tremor.  Endo/Heme/Allergies:        See allergy listing  Psychiatric/Behavioral:  Positive for depression and hallucinations. Negative for substance abuse and suicidal ideas. The patient is nervous/anxious. The patient does not have insomnia.    Blood pressure 103/79, pulse 75, temperature 98.4 F (36.9 C), temperature source Oral, resp. rate 16, height 5\' 9"  (1.753 m), weight 71.7 kg, SpO2 100%. Body mass index is 23.33 kg/m.   Treatment Plan Summary: Daily contact with patient to assess and evaluate symptoms and progress in treatment and Medication management   Jerry [name] is a 48 yr old male who presented on 4/23 to [facility] with complaints of paranoia, he was admitted to [facility] on 4/27.  PPHx is significant for Schizophrenia, Depression, PTSD, and Polysubstance  Abuse (EtOH, Cocaine, Meth, THC, Barbiturates), and multiple Psychiatric Hospitalizations (last [facility] 06/2023).   Jerry [name] has a well documented history of Schizophrenia and substance abuse. He is currently disoriented and unable to fully interview.  As he was already started on Haldol  we will continue with this.   We will continue with the CIWA and PRN Ativan  given his continued elevated CIWA.  Given his history of withdrawal seizures we will also order a quick taper of ativan .   Schizophrenia: -Continue Haldol  5 mg BID for psychosis -Will consider starting antidepressant once more stable -Continue Agitation Protocol: Haldol /Ativan Jerry [name]   Withdrawal: -Continue CIWA, last score @CIWA @ -Continue Ativan  1 mg q6 PRN CIWA>10 -Continue Ativan  taper to end 4/30 -Continue Imodium  2-4 mg PRN diarrhea -Continue Zofran -ODT 4 mg q6 PRN nausea -Continue Thiamine  100 mg daily for nutritional supplementation   -Continue Doxycycline  100 mg BID -Continue Ensure 237 mL BID -Continue PRN's: Tylenol , Maalox, Atarax , Milk of Magnesia   Observation Level/Precautions:  15 minute checks  Laboratory:  CMP:WNL except Na: 131,  Albu: 3.3, ALT: 45,  CBC: WNL except RBC: 3.81,  Hem: 12.6,  HCT: 37.8,  Plat: 418,  Eosin Abs: 0.6,  UDS: Benzo, Amphet, THC, Barbiturates positive,  EKG: Sinus Rhythm w/ marked Sinus Arrhythmia w/ Qtc: 481.  Psychotherapy:    Medications:  Haldol , Ativan   Consultations:    Discharge Concerns:    Estimated LOS: 5-7 days  Other:      Physician Treatment Plan for Primary Diagnosis: Schizophrenia (HCC) Long Term Goal(s): Improvement in symptoms so as ready for discharge   Short Term  Goals: Ability to identify changes in lifestyle to reduce recurrence of condition will improve, Ability to verbalize feelings will improve, Ability to demonstrate self-control will improve, Ability to identify and develop effective coping behaviors will improve, Ability to maintain clinical measurements  within normal limits will improve, and Ability to identify triggers associated with substance abuse/mental health issues will improve   Physician Treatment Plan for Secondary Diagnosis: Principal Problem:   Schizophrenia (HCC)   Long Term Goal(s): Improvement in symptoms so as ready for discharge   Short Term Goals: Ability to identify changes in lifestyle to reduce recurrence of condition will improve, Ability to verbalize feelings will improve, Ability to demonstrate self-control will improve, Ability to identify and develop effective coping behaviors will improve, Ability to maintain clinical measurements within normal limits will improve, and Ability to identify triggers associated with substance abuse/mental health issues will improve   I certify that inpatient services furnished can reasonably be expected to improve the patient's condition.     Laurence Pons, FNP 07/18/2023, 3:20 PM Patient ID: Jerry [name], male   DOB: 05-21-1975, 48 y.o.   MRN: [id number] Patient ID: Jerry [name], male   DOB: June 17, 1975, 48 y.o.   MRN: [id number]

## 2023-07-18 NOTE — Plan of Care (Signed)
  Problem: Education: Goal: Knowledge of [place] [facility] Education information/materials will improve Outcome: Progressing Goal: Emotional status will improve Outcome: Progressing Goal: Mental status will improve Outcome: Progressing Goal: Verbalization of understanding the information provided will improve Outcome: Progressing   Problem: Activity: Goal: Sleeping patterns will improve Outcome: Progressing   Problem: Activity: Goal: Interest or engagement in activities will improve Outcome: Not Progressing: pt was isolative to room

## 2023-07-18 NOTE — Plan of Care (Addendum)
 Nurse discussed discharge with patient today and he is excited to be discharged soon.

## 2023-07-18 NOTE — Progress Notes (Signed)
 Social worker met with provider who stated patient will not be discharging today. Informed provider of patient's discharge plan, as this social worker had been told earlier in the morning that patient would be discharging today. Patient was also told he would discharge today. Provider stated patient was okay to leave first thing in the morning on 07/19/23 to [facility] via taxi voucher.   Social worker informed patient of discharge tomorrow on 07/19/23, which he was disappointed by but understood as the provider has the final say regarding discharge. Transportation will be arranged tomorrow morning.    Cosme Jacob, LCSWA  02:21PM

## 2023-07-18 NOTE — Plan of Care (Signed)
 Nurse discussed anxiety, depression and coping skills with patient.

## 2023-07-18 NOTE — Progress Notes (Signed)
   07/17/23 2100  Psych Admission Type (Psych Patients Only)  Admission Status Voluntary  Psychosocial Assessment  Patient Complaints Irritability;Other (Comment);Anxiety (sleepy)  Eye Contact Brief  Facial Expression Pensive  Affect Appropriate to circumstance  Speech Soft  Interaction Assertive;Cautious;Minimal;Forwards little  Motor Activity Slow  Appearance/Hygiene Disheveled  Behavior Characteristics Cooperative;Guarded  Mood Anxious  Thought Process  Coherency WDL  Content WDL  Delusions None reported or observed  Perception WDL  Hallucination None reported or observed  Judgment Impaired  Confusion None  Danger to Self  Current suicidal ideation? Denies  Self-Injurious Behavior No self-injurious ideation or behavior indicators observed or expressed   Agreement Not to Harm Self Yes  Description of Agreement Verbal  Danger to Others  Danger to Others None reported or observed

## 2023-07-18 NOTE — [facility] Suicide Risk Assessment (Signed)
 [facility] INPATIENT:  Family/Significant Other Suicide Prevention Education  Suicide Prevention Education:  Patient Refusal for Family/Significant Other Suicide Prevention Education: The patient Jerry [name] has refused to provide written consent for family/significant other to be provided Family/Significant Other Suicide Prevention Education during admission and/or prior to discharge.  Physician notified.  Patient was given Suicide Prevention Education pamphlet and was reviewed with Child psychotherapist. Patient understood that should he have an emergency mental health event, to call 911.   Brantlee Hinde M Macall Mccroskey, LCSWA 07/18/2023, 9:55 AM

## 2023-07-18 NOTE — Progress Notes (Signed)
 Social worker called [employer] and rescheduled pt pickup for 12:30pm, as we are awaiting doctor's orders to be input. Informed patient of this.

## 2023-07-18 NOTE — Progress Notes (Signed)
 Social worker called and scheduled taxi for patient discharge time of 12pm. Patient to discharge to [facility] located at [address] [place], [place] [address].

## 2023-07-19 DIAGNOSIS — F200 Paranoid schizophrenia: Secondary | ICD-10-CM | POA: Diagnosis not present

## 2023-07-19 LAB — COMPREHENSIVE METABOLIC PANEL WITH GFR
ALT: 34 U/L (ref 0–44)
AST: 30 U/L (ref 15–41)
Albumin: 3.5 g/dL (ref 3.5–5.0)
Alkaline Phosphatase: 51 U/L (ref 38–126)
Anion gap: 10 (ref 5–15)
BUN: 16 mg/dL (ref 6–20)
CO2: 23 mmol/L (ref 22–32)
Calcium: 9.2 mg/dL (ref 8.9–10.3)
Chloride: 98 mmol/L (ref 98–111)
Creatinine, Ser: 0.76 mg/dL (ref 0.61–1.24)
GFR, Estimated: >60 mL/min (ref >60)
Glucose, Bld: 104 mg/dL — ABNORMAL HIGH (ref 70–99)
Potassium: 4.3 mmol/L (ref 3.5–5.1)
Sodium: 131 mmol/L — ABNORMAL LOW (ref 135–145)
Total Bilirubin: 0.6 mg/dL (ref 0.0–1.2)
Total Protein: 8.5 g/dL — ABNORMAL HIGH (ref 6.5–8.1)

## 2023-07-19 LAB — VITAMIN D 25 HYDROXY (VIT D DEFICIENCY, FRACTURES): Vit D, 25-Hydroxy: 32.02 ng/mL (ref 30–100)

## 2023-07-19 LAB — LIPID PANEL
Cholesterol: 168 mg/dL (ref 0–200)
HDL: 42 mg/dL (ref >40)
LDL Cholesterol: 102 mg/dL — ABNORMAL HIGH (ref 0–99)
Total CHOL/HDL Ratio: 4 ratio
Triglycerides: 120 mg/dL (ref <150)
VLDL: 24 mg/dL (ref 0–40)

## 2023-07-19 LAB — HEMOGLOBIN A1C
Hgb A1c MFr Bld: 4.8 % (ref 4.8–5.6)
Mean Plasma Glucose: 91.06 mg/dL

## 2023-07-19 LAB — RPR: RPR Ser Ql: NONREACTIVE

## 2023-07-19 LAB — VITAMIN B12: Vitamin B-12: 269 pg/mL (ref 180–914)

## 2023-07-19 LAB — TSH: TSH: 1.931 u[IU]/mL (ref 0.350–4.500)

## 2023-07-19 LAB — HIV ANTIBODY (ROUTINE TESTING W REFLEX): HIV Screen 4th Generation wRfx: NONREACTIVE

## 2023-07-19 MED ORDER — VITAMIN B-1 100 MG PO TABS: 100.0000 mg | ORAL_TABLET | Freq: Every day | ORAL | 0 refills | Status: DC

## 2023-07-19 MED ORDER — HALOPERIDOL 5 MG PO TABS: 5.0000 mg | ORAL_TABLET | Freq: Two times a day (BID) | ORAL | 0 refills | Status: DC

## 2023-07-19 MED ORDER — TRAZODONE HCL 50 MG PO TABS: 50.0000 mg | ORAL_TABLET | Freq: Every evening | ORAL | 0 refills | Status: DC | PRN

## 2023-07-19 MED ORDER — HYDROXYZINE HCL 25 MG PO TABS
25.0000 mg | ORAL_TABLET | Freq: Three times a day (TID) | ORAL | 0 refills | Status: DC | PRN
Start: 2023-07-19 — End: 2023-09-11

## 2023-07-19 NOTE — Discharge Summary (Signed)
 Physician Discharge Summary Note  Patient:  Jerry [name] is an 48 y.o., male MRN:  [id number] DOB:  22-Jan-1976 Patient phone:  [phone] (home)  Patient address:   [address] [place] [address],  Total Time spent with patient: 45 minutes  Date of Admission:  07/14/2023 Date of Discharge:   07/19/2023  Reason for Admission:  Jerry [name] is a 48 yr old male who presented on 4/23 to [facility] with complaints of paranoia, he was admitted to [facility] on 4/27.  PPHx is significant for Schizophrenia, Depression, PTSD, and Polysubstance Abuse (EtOH, Cocaine, Meth, THC, Barbiturates), and multiple Psychiatric Hospitalizations (last [facility] 06/2023).   Principal Problem: Schizophrenia [facility]) Discharge Diagnoses: Principal Problem:   Schizophrenia (HCC)  Past Psychiatric History: Schizophrenia, Depression, PTSD, and Polysubstance Abuse (EtOH, Cocaine, Meth, THC, Barbiturates), and multiple Psychiatric Hospitalizations (last [facility] 06/2023).   Past Medical History:  Past Medical History:  Diagnosis Date   Anxiety    Bipolar 1 disorder (HCC)    Depression    Drug-induced seizure (HCC)    EtOH dependence (HCC)    Hallucination    Paranoid schizophrenia (HCC)    since pt's 20's   PTSD (post-traumatic stress disorder)     Past Surgical History:  Procedure Laterality Date   KIDNEY DONATION Right 2003   Family History:  Family History  Problem Relation Age of Onset   Heart disease Mother    Cancer Mother    Lung cancer Mother    Heart disease Father    Diabetes Father    Diabetes Maternal Grandmother    Stroke Paternal Grandfather    Family Psychiatric  History: See H&P Social History:  Social History   Substance and Sexual Activity  Alcohol Use Yes   Comment: Decreased from 12 cans of beer and 20 shots of liquor about 2 months ago     Social History   Substance and Sexual Activity  Drug Use Yes   Types: Cocaine, Heroin, Amphetamines, Methamphetamines, Marijuana    Comment: last drug use a few days ago    Social History   Socioeconomic History   Marital status: Married    Spouse name: Not on file   Number of children: Not on file   Years of education: Not on file   Highest education level: Not on file  Occupational History   Not on file  Tobacco Use   Smoking status: Every Day    Current packs/day: 1.00    Types: Cigarettes   Smokeless tobacco: Never  Vaping Use   Vaping status: Never Used  Substance and Sexual Activity   Alcohol use: Yes    Comment: Decreased from 12 cans of beer and 20 shots of liquor about 2 months ago   Drug use: Yes    Types: Cocaine, Heroin, Amphetamines, Methamphetamines, Marijuana    Comment: last drug use a few days ago   Sexual activity: Yes  Other Topics Concern   Not on file  Social History Narrative   Lives alone currently   Social Drivers of Health   Financial Resource Strain: Not on file  Food Insecurity: Patient Unable To Answer (07/14/2023)   Hunger Vital Sign    Worried About Running Out of Food in the Last Year: Patient unable to answer    Ran Out of Food in the Last Year: Patient unable to answer  Recent Concern: Food Insecurity - Food Insecurity Present (07/01/2023)   Hunger Vital Sign    Worried About Running Out  of Food in the Last Year: Sometimes true    Ran Out of Food in the Last Year: Sometimes true  Transportation Needs: Patient Declined (07/14/2023)   PRAPARE - Administrator, Civil Service (Medical): Patient declined    Lack of Transportation (Non-Medical): Patient declined  Recent Concern: Transportation Needs - Unmet Transportation Needs (07/01/2023)   PRAPARE - Administrator, Civil Service (Medical): Yes    Lack of Transportation (Non-Medical): Yes  Physical Activity: Not on file  Stress: Not on file  Social Connections: Socially Isolated (07/01/2023)   Social Connection and Isolation Panel [NHANES]    Frequency of Communication with Friends and Family:  Never    Frequency of Social Gatherings with Friends and Family: Never    Attends Religious Services: Never    Database administrator or Organizations: No    Attends Banker Meetings: Never    Marital Status: Separated    Hospital Course:  During the patient's hospitalization, patient had extensive initial psychiatric evaluation, and follow-up psychiatric evaluations every day.  Psychiatric diagnoses provided upon initial assessment:  Schizophrenia (HCC)   Patient's psychiatric medications were adjusted on admission:  Continue Haldol  5 mg BID for psychosis -Will consider starting antidepressant once more stable -Continue Agitation Protocol: Haldol /Ativan /Benadryl    During the hospitalization, other adjustments were made to the patient's psychiatric medication regimen:  None  Patient's care was discussed during the interdisciplinary team meeting every day during the hospitalization.  The patient denies having side effects to prescribed psychiatric medication.  Gradually, patient started adjusting to milieu. The patient was evaluated each day by a clinical provider to ascertain response to treatment. Improvement was noted by the patient's report of decreasing symptoms, improved sleep and appetite, affect, medication tolerance, behavior, and participation in unit programming.  Patient was asked each day to complete a self inventory noting mood, mental status, pain, new symptoms, anxiety and concerns.    Symptoms were reported as significantly decreased or resolved completely by discharge.   On day of discharge, the patient reports that their mood is stable. The patient denied having suicidal thoughts for more than 48 hours prior to discharge.  Patient denies having homicidal thoughts.  Patient denies having auditory hallucinations.  Patient denies any visual hallucinations or other symptoms of psychosis. The patient was motivated to continue taking medication with a goal of  continued improvement in mental health.   The patient reports their target psychiatric symptoms of psychosis responded well to the psychiatric medications, and the patient reports overall benefit other psychiatric hospitalization. Supportive psychotherapy was provided to the patient. The patient also participated in regular group therapy while hospitalized. Coping skills, problem solving as well as relaxation therapies were also part of the unit programming.  Labs were reviewed with the patient, and abnormal results were discussed with the patient.  The patient is able to verbalize their individual safety plan to this provider.  # It is recommended to the patient to continue psychiatric medications as prescribed, after discharge from the hospital.    # It is recommended to the patient to follow up with your outpatient psychiatric provider and PCP.  # It was discussed with the patient, the impact of alcohol, drugs, tobacco have been there overall psychiatric and medical wellbeing, and total abstinence from substance use was recommended the patient.ed.  # Prescriptions provided or sent directly to preferred pharmacy at discharge. Patient agreeable to plan. Given opportunity to ask questions. Appears to feel comfortable with discharge.    #  In the event of worsening symptoms, the patient is instructed to call the crisis hotline, 911 and or go to the nearest ED for appropriate evaluation and treatment of symptoms. To follow-up with primary care provider for other medical issues, concerns and or health care needs  # Patient was discharged [facility] with a plan to follow up as noted below.   Physical Findings: AIMS:  , ,  ,  ,    CIWA:  CIWA-Ar Total: 0 COWS:     Musculoskeletal: Strength & Muscle Tone: within normal limits Gait & Station: normal Patient leans: N/A   Psychiatric Specialty Exam:  Presentation  General Appearance:  Casual  Eye Contact: Fair  Speech: Garbled  Speech  Volume: Normal  Handedness: Right   Mood and Affect  Mood: Euthymic  Affect: Congruent   Thought Process  Thought Processes: Coherent (At baseline)  Descriptions of Associations:Intact  Orientation:Full (Time, Place and Person)  Thought Content:Rumination  History of Schizophrenia/Schizoaffective disorder:Yes  Duration of Psychotic Symptoms:Greater than six months  Hallucinations:Hallucinations: None Description of Auditory Hallucinations: None  Ideas of Reference:None  Suicidal Thoughts:Suicidal Thoughts: No  Homicidal Thoughts:Homicidal Thoughts: No   Sensorium  Memory: Immediate Fair  Judgment: Fair  Insight: Fair   Art therapist  Concentration: Fair  Attention Span: Fair  Recall: [employer] of Knowledge: Fair  Language: Fair   Psychomotor Activity  Psychomotor Activity: Psychomotor Activity: Normal   Assets  Assets: Communication Skills; Physical Health; Resilience   Sleep  Sleep: Sleep: Good Number of Hours of Sleep: 8    Physical Exam: Physical Exam Vitals and nursing note reviewed.  Constitutional:      Appearance: Normal appearance.  HENT:     Head: Normocephalic.     Right Ear: External ear normal.     Left Ear: External ear normal.     Nose: Nose normal.     Mouth/Throat:     Mouth: Mucous membranes are moist.     Pharynx: Oropharynx is clear.  Eyes:     Extraocular Movements: Extraocular movements intact.  Cardiovascular:     Rate and Rhythm: Bradycardia present.  Pulmonary:     Effort: Pulmonary effort is normal.  Abdominal:     Comments: Deferred  Genitourinary:    Comments: Deferred Musculoskeletal:     Cervical back: Normal range of motion.  Skin:    General: Skin is warm.  Neurological:     General: No focal deficit present.     Mental Status: He is alert and oriented to person, place, and time. Mental status is at baseline.  Psychiatric:        Mood and Affect: Mood normal.         Behavior: Behavior normal.    Review of Systems  Constitutional:  Negative for chills and fever.  HENT:  Negative for sore throat.   Eyes:  Negative for blurred vision.  Respiratory:  Negative for cough, sputum production, shortness of breath and wheezing.   Cardiovascular:  Negative for chest pain and palpitations.  Gastrointestinal:  Negative for heartburn, nausea and vomiting.  Genitourinary:  Negative for dysuria, frequency and urgency.  Musculoskeletal:  Negative for myalgias.  Skin:  Negative for itching.  Endo/Heme/Allergies:        See allergy listing  Psychiatric/Behavioral:  Negative for depression, hallucinations, substance abuse and suicidal ideas. The patient is not nervous/anxious.    Blood pressure 90/61, pulse (!) 58, temperature 98.4 F (36.9 C), temperature source Oral, resp. rate 16, height 5'  9" (1.753 m), weight 71.7 kg, SpO2 100%. Body mass index is 23.33 kg/m.   Social History   Tobacco Use  Smoking Status Every Day   Current packs/day: 1.00   Types: Cigarettes  Smokeless Tobacco Never   Tobacco Cessation:  A prescription for an FDA-approved tobacco cessation medication was offered at discharge and the patient refused   Blood Alcohol level:  Lab Results  Component Value Date   [facility] <15 07/13/2023   ETH <15 07/11/2023    Metabolic Disorder Labs:  Lab Results  Component Value Date   HGBA1C 5.3 07/09/2022   MPG 105.41 07/09/2022   MPG 111.15 10/31/2020   No results found for: "PROLACTIN" Lab Results  Component Value Date   CHOL 168 07/19/2023   TRIG 120 07/19/2023   HDL 42 07/19/2023   CHOLHDL 4.0 07/19/2023   VLDL 24 07/19/2023   LDLCALC 102 (H) 07/19/2023   LDLCALC 78 07/09/2022    See Psychiatric Specialty Exam and Suicide Risk Assessment completed by Attending Physician prior to discharge.  Discharge destination:  Other:  [facility]  Is patient on multiple antipsychotic therapies at discharge: No Has Patient had three or more failed  trials of antipsychotic monotherapy by history:  No  Recommended Plan for Multiple Antipsychotic Therapies: NA  Discharge Instructions     Increase activity slowly   Complete by: As directed       Allergies as of 07/19/2023       Reactions   Penicillins Other (See Comments), Swelling   Reaction occurred in childhood        Medication List     TAKE these medications      Indication  haloperidol  5 MG tablet Commonly known as: HALDOL  Take 1 tablet (5 mg total) by mouth 2 (two) times daily.  Indication: Psychosis   hydrOXYzine  25 MG tablet Commonly known as: ATARAX  Take 1 tablet (25 mg total) by mouth 3 (three) times daily as needed for anxiety.  Indication: Feeling Anxious   thiamine  100 MG tablet Commonly known as: Vitamin B-1 Take 1 tablet (100 mg total) by mouth daily. Start taking on: Jul 20, 2023  Indication: Deficiency of Vitamin B1   traZODone  50 MG tablet Commonly known as: DESYREL  Take 1 tablet (50 mg total) by mouth at bedtime as needed for sleep.  Indication: Trouble Sleeping        Follow-up Information     [place] Go on 07/26/2023.   Specialty: Professional Counselor Why: Please go to this provider on 07/26/23 at 9:00 am for an assessment, to obtain an appointment for therapy services. You may also go on Monday through Friday, from 9 am to 1 pm. Contact information: [address]  [address] [place] [address] [phone]         [facility]. Go on 07/25/2023.   Specialty: Behavioral Health Why: Please go to this provider on 07/25/23 at 7:00 am for an assessment, to obtain medication management services.  You may also go Monday through Friday at  7:00 am.  You must have a telephone number for an actual appointment. Contact information: [address] [place]  [address] [phone]                Follow-up recommendations:   Discharge Recommendations:    The patient  is being discharged to [facility].   Patient is to take his discharge medications as ordered. ?See follow up above.   We recommend that he participates in  individual therapy to target uncontrollable agitation and substance abuse.    We recommend that he participates in therapy to target personal conflict, to improve communication skills and conflict resolution skills. Patient is to initiate/implement a contingency based behavioral model to address his behavior.   We recommend that he gets AIMS scale, height, weight, blood pressure, fasting lipid panel, fasting blood sugar in three months from discharge if he's on atypical antipsychotics.    Patient will benefit from monitoring of recurrent suicidal ideation since patient is on antidepressant medication.   The patient should abstain from all illicit substances and alcohol.   If the patient's symptoms worsen or do not continue to improve or if the patient becomes actively suicidal or homicidal then it is recommended that the patient return to the closest hospital emergency room or call 911 for further evaluation and treatment. National Suicide Prevention Lifeline 1800-SUICIDE or 534-103-5486.   Please follow up with your primary medical doctor for all other medical needs.    The patient has been educated on the possible side effects to medications and she/her guardian is to contact a medical professional and inform outpatient provider of any new side effects of medication.   He is to take regular diet and activity as tolerated. ?Will benefit from moderate daily exercise.   Patient and Family was educated about removing/locking any firearms, medications or dangerous products from the home.    Activity:  As tolerated   Diet:  Regular Diet     Signed:  Laurence Pons, FNP 07/19/2023, 10:41 AM

## 2023-07-19 NOTE — Progress Notes (Signed)
 Social worker called [employer] taxi and scheduled pickup for pt at 11:00am to [facility]. Patient is aware and confirmed discharge at 11am via taxi.

## 2023-07-19 NOTE — Plan of Care (Deleted)
 Patient understands follow up instructions at discharge

## 2023-07-19 NOTE — [facility] Suicide Risk Assessment (Signed)
 Suicide Risk Assessment  Discharge Assessment    [facility] Discharge Suicide Risk Assessment   Principal Problem: Schizophrenia [facility]) Discharge Diagnoses: Principal Problem:   Schizophrenia [facility])  Reason for admission:  Jerry [name] is a 48 yr old male who presented on 4/23 to [facility] with complaints of paranoia, he was admitted to [facility] on 4/27. PPHx is significant for Schizophrenia, Depression, PTSD, and Polysubstance Abuse (EtOH, Cocaine, Meth, THC, Barbiturates), and multiple Psychiatric Hospitalizations (last [facility] 06/2023).   Total Time spent with patient: 45 minutes  Musculoskeletal: Strength & Muscle Tone: within normal limits Gait & Station: normal Patient leans: N/A  Psychiatric Specialty Exam  Presentation  General Appearance:  Casual  Eye Contact: Fair  Speech: Garbled  Speech Volume: Normal  Handedness: Right  Mood and Affect  Mood: Euthymic  Duration of Depression Symptoms: No data recorded Affect: Congruent  Thought Process  Thought Processes: Coherent (At baseline)  Descriptions of Associations:Intact  Orientation:Full (Time, Place and Person)  Thought Content:Rumination  History of Schizophrenia/Schizoaffective disorder:Yes  Duration of Psychotic Symptoms:Greater than six months  Hallucinations:Hallucinations: None Description of Auditory Hallucinations: None  Ideas of Reference:None  Suicidal Thoughts:Suicidal Thoughts: No  Homicidal Thoughts:Homicidal Thoughts: No  Sensorium  Memory: Immediate Fair  Judgment: Fair  Insight: Fair  Art therapist  Concentration: Fair  Attention Span: Fair  Recall: [employer] of Knowledge: Fair  Language: Fair  Psychomotor Activity  Psychomotor Activity: Psychomotor Activity: Normal  Assets  Assets: Communication Skills; Physical Health; Resilience  Sleep  Sleep: Sleep: Good Number of Hours of Sleep: 8  Physical Exam: Physical Exam Vitals and nursing note  reviewed.  HENT:     Head: Normocephalic.     Right Ear: External ear normal.     Left Ear: External ear normal.     Nose: Nose normal.     Mouth/Throat:     Mouth: Mucous membranes are moist.     Pharynx: Oropharynx is clear.  Eyes:     Extraocular Movements: Extraocular movements intact.  Cardiovascular:     Rate and Rhythm: Bradycardia present.  Pulmonary:     Effort: Pulmonary effort is normal.  Abdominal:     Comments: Deferred  Genitourinary:    Comments: Deferred Musculoskeletal:        General: Normal range of motion.     Cervical back: Normal range of motion.  Skin:    General: Skin is warm.  Neurological:     General: No focal deficit present.     Mental Status: He is alert and oriented to person, place, and time.  Psychiatric:        Mood and Affect: Mood normal.        Behavior: Behavior normal.        Thought Content: Thought content normal.    Review of Systems  Constitutional:  Negative for chills and fever.  HENT:  Negative for sore throat.   Eyes:  Negative for blurred vision.  Respiratory:  Negative for cough, sputum production, shortness of breath and wheezing.   Cardiovascular:  Negative for chest pain and palpitations.  Gastrointestinal:  Negative for heartburn and nausea.  Genitourinary:  Negative for dysuria, frequency and urgency.  Musculoskeletal:  Negative for myalgias.  Skin:  Negative for rash.  Neurological:  Negative for dizziness, tingling, tremors, seizures and headaches.  Endo/Heme/Allergies:        See allergy listing  Psychiatric/Behavioral:  Negative for depression, hallucinations, substance abuse and suicidal ideas. The patient is not nervous/anxious.  Blood pressure 90/61, pulse (!) 58, temperature 98.4 F (36.9 C), temperature source Oral, resp. rate 16, height 5\' 9"  (1.753 m), weight 71.7 kg, SpO2 100%. Body mass index is 23.33 kg/m.  Mental Status Per Nursing Assessment::   On Admission:  Suicidal ideation indicated by  patient  Demographic Factors:  Male, Caucasian, Low socioeconomic status, and Unemployed  Loss Factors: Decrease in vocational status, Loss of significant relationship, and Financial problems/change in socioeconomic status  Historical Factors: Family history of mental illness or substance abuse  Risk Reduction Factors:   Positive social support, Positive therapeutic relationship, and Positive coping skills or problem solving skills  Continued Clinical Symptoms:  Depression:   Recent sense of peace/wellbeing Alcohol/Substance Abuse/Dependencies Schizophrenia:   Paranoid or undifferentiated type More than one psychiatric diagnosis Unstable or Poor Therapeutic Relationship Previous Psychiatric Diagnoses and Treatments Medical Diagnoses and Treatments/Surgeries  Cognitive Features That Contribute To Risk:  Polarized thinking    Suicide Risk:  Mild:  Suicidal ideation of limited frequency, intensity, duration, and specificity.  There are no identifiable plans, no associated intent, mild dysphoria and related symptoms, good self-control (both objective and subjective assessment), few other risk factors, and identifiable protective factors, including available and accessible social support.   Follow-up Information     [place] Go on 07/26/2023.   Specialty: Professional Counselor Why: Please go to this provider on 07/26/23 at 9:00 am for an assessment, to obtain an appointment for therapy services. You may also go on Monday through Friday, from 9 am to 1 pm. Contact information: [address]  [address] [place] [address] [phone]         [facility]. Go on 07/25/2023.   Specialty: Behavioral Health Why: Please go to this provider on 07/25/23 at 7:00 am for an assessment, to obtain medication management services.  You may also go Monday through Friday at  7:00 am.  You must have a telephone number for an actual  appointment. Contact information: [address] [place]  [id number] [phone]                Plan Of Care/Follow-up recommendations:   Discharge Recommendations:    The patient is being discharged to [facility].   Patient is to take his discharge medications as ordered. ?See follow up above.   We recommend that he participates in individual therapy to target uncontrollable agitation and substance abuse.    We recommend that he participates in therapy to target personal conflict, to improve communication skills and conflict resolution skills. Patient is to initiate/implement a contingency based behavioral model to address his behavior.   We recommend that he gets AIMS scale, height, weight, blood pressure, fasting lipid panel, fasting blood sugar in three months from discharge if he's on atypical antipsychotics.    Patient will benefit from monitoring of recurrent suicidal ideation since patient is on antidepressant medication.   The patient should abstain from all illicit substances and alcohol.   If the patient's symptoms worsen or do not continue to improve or if the patient becomes actively suicidal or homicidal then it is recommended that the patient return to the closest hospital emergency room or call 911 for further evaluation and treatment. National Suicide Prevention Lifeline 1800-SUICIDE or 763-668-2652.   Please follow up with your primary medical doctor for all other medical needs.    The patient has been educated on the possible side effects to medications and she/her guardian is to contact a medical professional  and inform outpatient provider of any new side effects of medication.   He is to take regular diet and activity as tolerated. ?Will benefit from moderate daily exercise.   Patient and Family was educated about removing/locking any firearms, medications or dangerous products from the home.      Activity:  As tolerated   Diet:  Regular Diet      Laurence Pons, FNP 07/19/2023, 10:10 AM

## 2023-07-19 NOTE — Progress Notes (Signed)
  [facility] Adult Case Management Discharge Plan :  Will you be returning to the same living situation after discharge:  Yes,  patient will be discharging to [facility] located at [address]  [place], [place] [address]. At discharge, do you have transportation home?: Yes,  patient will be transported via taxi voucher to [facility] Do you have the ability to pay for your medications: Yes,  pt is insured by [employer]   Release of information consent forms completed and in the chart;  Patient's signature needed at discharge.  Patient to Follow up at:  Follow-up Information     [place] Go on 07/26/2023.   Specialty: Professional Counselor Why: Please go to this provider on 07/26/23 at 9:00 am for an assessment, to obtain an appointment for therapy services. You may also go on Monday through Friday, from 9 am to 1 pm. Contact information: [address]  [address] [place] [address] [phone]         [facility]. Go on 07/25/2023.   Specialty: Behavioral Health Why: Please go to this provider on 07/25/23 at 7:00 am for an assessment, to obtain medication management services.  You may also go Monday through Friday at  7:00 am.  You must have a telephone number for an actual appointment. Contact information: [phone] [employer]  [phone]                Next level of care provider has access to [facility] Link:no  Safety Planning and Suicide Prevention discussed: Yes,  safety planning was completed with patient, as he has declined to inform CSW of support person for ROI.     Has patient been referred to the Quitline?: Patient refused referral for treatment  Patient has been referred for addiction treatment: Patient refused referral for treatment.  Azalya Galyon M Cavan Bearden, LCSWA 07/19/2023, 9:10 AM

## 2023-07-19 NOTE — Plan of Care (Signed)
  Problem: Health Behavior/Discharge Planning: Goal: Identification of resources available to assist in meeting health care needs will improve Outcome: Adequate for Discharge Goal: Compliance with treatment plan for underlying cause of condition will improve Outcome: Adequate for Discharge   Problem: Education: Goal: Understanding of discharge needs will improve 07/19/2023 1129 by Victorine Grater, RN Outcome: Adequate for Discharge 07/19/2023 0823 by Victorine Grater, RN Reactivated

## 2023-07-19 NOTE — Progress Notes (Signed)
 Pt discharged to lobby with taxi voucher. Pt was stable and appreciative at that time. All papers were given and valuables returned. Suicide safety plan completed.Verbal understanding expressed. Denies SI/HI and A/VH. Pt given opportunity to express concerns and ask questions.

## 2023-07-19 NOTE — Progress Notes (Signed)
   07/18/23 2015  Psych Admission Type (Psych Patients Only)  Admission Status Voluntary  Psychosocial Assessment  Patient Complaints Depression;Anxiety  Eye Contact Darting;Brief  Facial Expression Anxious  Affect Anxious  Speech Logical/coherent;Other (Comment) (Garbled)  Interaction Assertive  Motor Activity Slow  Appearance/Hygiene Unremarkable  Behavior Characteristics Calm;Cooperative  Mood Anxious (Pt opened up about his life, living in his car or sleeping in the woods. He shared his oldest son is a drug addict that manipulates his wife and doesn't respect him which causes tension. He shared how his son takes the easy way out.)  Thought Process  Coherency WDL  Content WDL  Delusions None reported or observed  Perception WDL  Hallucination None reported or observed  Judgment Impaired  Confusion None  Danger to Self  Current suicidal ideation? Denies  Self-Injurious Behavior No self-injurious ideation or behavior indicators observed or expressed   Agreement Not to Harm Self Yes  Description of Agreement Verbal  Danger to Others  Danger to Others None reported or observed

## 2023-07-19 NOTE — Plan of Care (Signed)
  Problem: Education: Goal: Understanding of discharge needs will improve Reactivated

## 2023-07-19 NOTE — Progress Notes (Signed)
   07/19/23 0800  Psych Admission Type (Psych Patients Only)  Admission Status Voluntary  Psychosocial Assessment  Patient Complaints Anxiety  Eye Contact Brief  Facial Expression Anxious  Affect Anxious  Speech Logical/coherent  Interaction Assertive  Motor Activity Other (Comment) (steady gait)  Appearance/Hygiene Unremarkable  Behavior Characteristics Appropriate to situation;Anxious  Mood Anxious  Thought Process  Coherency WDL  Content WDL  Delusions None reported or observed  Perception WDL  Hallucination None reported or observed  Judgment Impaired  Confusion None  Danger to Self  Current suicidal ideation? Denies  Self-Injurious Behavior No self-injurious ideation or behavior indicators observed or expressed   Danger to Others  Danger to Others None reported or observed

## 2023-07-30 ENCOUNTER — Ambulatory Visit: Payer: Self-pay | Admitting: Nurse Practitioner

## 2023-08-04 ENCOUNTER — Emergency Department (HOSPITAL_COMMUNITY)
Admission: EM | Admit: 2023-08-04 | Discharge: 2023-08-07 | Disposition: A | Discharge status: Home / Self Care | Payer: MEDICAID | Attending: Emergency Medicine | Admitting: Emergency Medicine

## 2023-08-04 ENCOUNTER — Encounter (HOSPITAL_COMMUNITY): Payer: Self-pay | Admitting: *Deleted

## 2023-08-04 ENCOUNTER — Other Ambulatory Visit: Payer: Self-pay

## 2023-08-04 DIAGNOSIS — R443 Hallucinations, unspecified: Secondary | ICD-10-CM

## 2023-08-04 DIAGNOSIS — F419 Anxiety disorder, unspecified: Secondary | ICD-10-CM | POA: Diagnosis not present

## 2023-08-04 DIAGNOSIS — F250 Schizoaffective disorder, bipolar type: Secondary | ICD-10-CM | POA: Diagnosis present

## 2023-08-04 DIAGNOSIS — F1514 Other stimulant abuse with stimulant-induced mood disorder: Secondary | ICD-10-CM | POA: Insufficient documentation

## 2023-08-04 DIAGNOSIS — F1990 Other psychoactive substance use, unspecified, uncomplicated: Secondary | ICD-10-CM

## 2023-08-04 DIAGNOSIS — F1994 Other psychoactive substance use, unspecified with psychoactive substance-induced mood disorder: Secondary | ICD-10-CM | POA: Diagnosis present

## 2023-08-04 DIAGNOSIS — F1910 Other psychoactive substance abuse, uncomplicated: Secondary | ICD-10-CM | POA: Insufficient documentation

## 2023-08-04 DIAGNOSIS — Z5900 Homelessness unspecified: Secondary | ICD-10-CM | POA: Insufficient documentation

## 2023-08-04 DIAGNOSIS — F17200 Nicotine dependence, unspecified, uncomplicated: Secondary | ICD-10-CM | POA: Diagnosis not present

## 2023-08-04 DIAGNOSIS — R440 Auditory hallucinations: Secondary | ICD-10-CM | POA: Diagnosis present

## 2023-08-04 DIAGNOSIS — F1510 Other stimulant abuse, uncomplicated: Secondary | ICD-10-CM | POA: Diagnosis present

## 2023-08-04 LAB — CBC WITH DIFFERENTIAL/PLATELET
Abs Immature Granulocytes: 0.04 10*3/uL (ref 0.00–0.07)
Basophils Absolute: 0 10*3/uL (ref 0.0–0.1)
Basophils Relative: 0 %
Eosinophils Absolute: 0.1 10*3/uL (ref 0.0–0.5)
Eosinophils Relative: 1 %
HCT: 40.7 % (ref 39.0–52.0)
Hemoglobin: 13.5 g/dL (ref 13.0–17.0)
Immature Granulocytes: 1 %
Lymphocytes Relative: 19 %
Lymphs Abs: 1.4 10*3/uL (ref 0.7–4.0)
MCH: 33.2 pg (ref 26.0–34.0)
MCHC: 33.2 g/dL (ref 30.0–36.0)
MCV: 100 fL (ref 80.0–100.0)
Monocytes Absolute: 0.8 10*3/uL (ref 0.1–1.0)
Monocytes Relative: 10 %
Neutro Abs: 5.1 10*3/uL (ref 1.7–7.7)
Neutrophils Relative %: 69 %
Platelets: 329 10*3/uL (ref 150–400)
RBC: 4.07 MIL/uL — ABNORMAL LOW (ref 4.22–5.81)
RDW: 13.3 % (ref 11.5–15.5)
WBC: 7.4 10*3/uL (ref 4.0–10.5)
nRBC: 0 % (ref 0.0–0.2)

## 2023-08-04 LAB — COMPREHENSIVE METABOLIC PANEL WITH GFR
ALT: 23 U/L (ref 0–44)
AST: 32 U/L (ref 15–41)
Albumin: 4.1 g/dL (ref 3.5–5.0)
Alkaline Phosphatase: 59 U/L (ref 38–126)
Anion gap: 8 (ref 5–15)
BUN: 10 mg/dL (ref 6–20)
CO2: 25 mmol/L (ref 22–32)
Calcium: 9.6 mg/dL (ref 8.9–10.3)
Chloride: 99 mmol/L (ref 98–111)
Creatinine, Ser: 0.98 mg/dL (ref 0.61–1.24)
GFR, Estimated: >60 mL/min (ref >60)
Glucose, Bld: 100 mg/dL — ABNORMAL HIGH (ref 70–99)
Potassium: 4 mmol/L (ref 3.5–5.1)
Sodium: 132 mmol/L — ABNORMAL LOW (ref 135–145)
Total Bilirubin: 1.1 mg/dL (ref 0.0–1.2)
Total Protein: 9.1 g/dL — ABNORMAL HIGH (ref 6.5–8.1)

## 2023-08-04 LAB — ETHANOL: Alcohol, Ethyl (B): <15 mg/dL (ref <15)

## 2023-08-04 MED ORDER — HYDROXYZINE HCL 25 MG PO TABS
25.0000 mg | ORAL_TABLET | Freq: Three times a day (TID) | ORAL | Status: DC | PRN
  Administered 2023-08-04 – 2023-08-05 (×2): 25 mg via ORAL
  Filled 2023-08-04 (×3): qty 1

## 2023-08-04 MED ORDER — TRAZODONE HCL 50 MG PO TABS
50.0000 mg | ORAL_TABLET | Freq: Every evening | ORAL | Status: DC | PRN
  Administered 2023-08-05 – 2023-08-06 (×2): 50 mg via ORAL
  Filled 2023-08-04 (×3): qty 1

## 2023-08-04 MED ORDER — LORAZEPAM 1 MG PO TABS
1.0000 mg | ORAL_TABLET | Freq: Once | ORAL | Status: AC
  Administered 2023-08-04: 1 mg via ORAL
  Filled 2023-08-04: qty 1

## 2023-08-04 MED ORDER — THIAMINE MONONITRATE 100 MG PO TABS
100.0000 mg | ORAL_TABLET | Freq: Every day | ORAL | Status: DC
  Administered 2023-08-04 – 2023-08-07 (×4): 100 mg via ORAL
  Filled 2023-08-04 (×4): qty 1

## 2023-08-04 MED ORDER — LORAZEPAM 2 MG/ML IJ SOLN
2.0000 mg | Freq: Four times a day (QID) | INTRAMUSCULAR | Status: DC | PRN
  Administered 2023-08-04: 2 mg via INTRAMUSCULAR
  Filled 2023-08-04: qty 1

## 2023-08-04 MED ORDER — HALOPERIDOL LACTATE 5 MG/ML IJ SOLN
5.0000 mg | Freq: Four times a day (QID) | INTRAMUSCULAR | Status: DC | PRN
  Administered 2023-08-04: 5 mg via INTRAMUSCULAR
  Filled 2023-08-04: qty 1

## 2023-08-04 MED ORDER — HALOPERIDOL 5 MG PO TABS
5.0000 mg | ORAL_TABLET | Freq: Two times a day (BID) | ORAL | Status: DC
  Administered 2023-08-05 – 2023-08-07 (×5): 5 mg via ORAL
  Filled 2023-08-04 (×6): qty 1

## 2023-08-04 NOTE — Consult Note (Signed)
 [facility] Psychiatric Consult Initial  Patient Name: .Jerry [name]  MRN: [id number]  DOB: Feb 28, 1976  Consult Order details:  Orders (From admission, onward)     Start     Ordered   08/04/23 1417  CONSULT TO CALL ACT TEAM       Ordering Provider: Janalee Mcmurray, PA-C  Provider:  (Not yet assigned)  Question:  Reason for Consult?  Answer:  Psych consult   08/04/23 1416             Mode of Visit: In person    Psychiatry Consult Evaluation  Service Date: Aug 04, 2023 LOS:  LOS: 0 days  Chief Complaint  Psychosis   Primary Psychiatric Diagnoses  Polysubstance abuse Schizoaffective disorder, bipolar type Anxiety  Assessment  Jerry [name] is a 48 y.o. male admitted: Presented to the EDfor 08/04/2023  5:29 AM for paranoia, and non compliant with medications for 2 months. Jerry [name] is a 48 y.o. male with medical history significant of alcohol abuse and schizophrenia history of DTs history of polysubstance abuse, GAD tobacco abuse, malingering, PTSD, tardive dyskinesia. Patient was reportedly BIB by [facility] from [facility] for paranoia. Hx of schizophrenia, non-compliant with medications x 2 months. Admits to using meth x 2 days ago which worsened symptoms.    His current presentation of hallucination, paranoia, and meth use is most consistent with possible decompensating mood disorder in the setting of treatment noncompliance. Not currently on any home psychotropics. Last methamphetamine use was reported to be about 3 days ago and he presented paranoid to the hospital. On initial examination, cooperative, at times appears paranoid. Please see plan below for detailed recommendations.   Diagnoses:  Active Hospital problems: Principal Problem:   Schizoaffective disorder, bipolar type (HCC) Active Problems:   Methamphetamine abuse (HCC)   Substance induced mood disorder (HCC)    Plan   ## Psychiatric Medication Recommendations:  - Continue patient home medications    ##  Medical Decision Making Capacity: Not specifically addressed in this encounter   ## Further Work-up:  -- Updated EKG ordered on Qtc 430 on 08/04/23  -- Pertinent labwork reviewed earlier this admission includes: CBC, NA- 132, UDS     ## Disposition:--We recommend inpatient psychiatric admission, patient currently is voluntary.  ## Behavioral / Environmental: -Delirium Precautions: Delirium Interventions for Nursing and Staff: - RN to open blinds every AM. - To Bedside: Glasses, hearing aide, and pt's own shoes. Make available to patients. when possible and encourage use. - Encourage po fluids when appropriate, keep fluids within reach. - OOB to chair with meals. - Passive ROM exercises to all extremities with AM & PM care. - RN to assess orientation to person, time and place QAM and PRN. - Recommend extended visitation hours with familiar family/friends as feasible. - Staff to minimize disturbances at night. Turn off television when pt asleep or when not in use.                ## Safety and Observation Level:  - Based on my clinical evaluation, I estimate the patient to be at high risk of self harm in the current setting. - At this time, we recommend  1:1 Observation. This decision is based on my review of the chart including patient's history and current presentation, interview of the patient, mental status examination, and consideration of suicide risk including evaluating suicidal ideation, plan, intent, suicidal or self-harm behaviors, risk factors, and protective factors. This judgment is based on our ability  to directly address suicide risk, implement suicide prevention strategies, and develop a safety plan while the patient is in the clinical setting. Please contact our team if there is a concern that risk level has changed.   CSSR Risk Category:C-SSRS RISK CATEGORY: No Risk   Suicide Risk Assessment: Patient has following modifiable risk factors for suicide: untreated depression and  medication noncompliance, substance and alcohol use which we are addressing by recommending that patient be admitted to an inpatient psychiatric facility. Patient has following non-modifiable or demographic risk factors for suicide: male gender, history of suicide attempt, and psychiatric hospitalization Patient has the following protective factors against suicide: none identified   Thank you for this consult request. Recommendations have been communicated to the primary team.  We will follow up at this time.   Chandra Come, PMHNP       History of Present Illness  Relevant Aspects of Hospital ED Course:  Admitted on 08/04/2023 for complaining of auditory and visual hallucinations.    Patient Report:  Jerry [name], 48 y.o., male patient seen face to face by this provider, consulted with Dr. Deborah Falling; and chart reviewed on 08/04/23.  On evaluation Jerry [name] reports that he used meth, thc, and alcohol last use was yesterday.  Patient states he is unable to recall how much methamphetamines he uses, but states he drinks about (2) 40 ounce beers a day.  Patient states that he remembers being at [facility], complaining of auditory and visual hallucinations. He feels that everyone is trying to kill him. He hears voices that are saying they are going to kill him. He sees people that are not there that are also saying they are going to kill him.  Patient is drowsy throughout the assessment, often needing redirection to wake up for assessment. Patient endorses SI, no plan, but states he feels that people are after him. Patient states he lives off and on with his wife, but would not elaborate.  He states that he has currently been living in a tent, for homeless people.  He states his appetite and sleep have been poor, due to using substances.    Patient has slurred and garbled speech, and behavior.  Patient denies self-harm/homicidal ideation. Patient endorses SI, no plan, also endorses AVH, with  paranoia, as he feels that people are after him. Patient is drowsy throughout assessment, and demonstrating tangential speech.  Jerry [name] has a history of multiple psychiatric admissions, states he has not taken any psychiatric medications since he was discharged from behavioral health Hospital on 07/19/2023.  Per chart review: Today 08/04/2023 in triage; paranoid during triage, he keeps saying "don't let me die, don't let them kill me, please don't let me die". Pt says he has not taken his medications in about "5 months". Denies ETOH, admits to "a little bit of meth". Denies SI or HI. Pt keeps biting his fingers and scratching the top of his head and noted to have excoriated areas where he keeps scratching his scalp.      Psych ROS:  Depression: Endorses Anxiety:  Endorses Mania (lifetime and current): Denies Psychosis: (lifetime and current): Endorses   Collateral information:  Contacted None: patient did not want any collateral called     Review of Systems  Psychiatric/Behavioral:  Positive for depression, hallucinations, substance abuse and suicidal ideas.      Psychiatric and Social History  Psychiatric History:  Information collected from patient, staff and chart review   Prev Dx/Sx: alcohol abuse and schizophrenia history  of DTs history of polysubstance abuse, GAD tobacco abuse, malingering, PTSD, tardive dyskinesia Current Psych Provider: Dr. Ethel Henry outpatient hx, currently unknown Home Meds (current):  Previous Med Trials: risperidone   Therapy: hx therapist-Brittney Fulton Job, currently unknown    Prior Psych Hospitalization: About 3 lifetime psychiatric hospitalizations Several lifetime episodes of suicidal ideation/attempts, including jumping in front of traffic at least once.  Prior Violence: unknown   Family Psych History: unknown Family Hx suicide: unknown   Social History:  Developmental Hx: unknown Educational Hx: grade education  Occupational Hx: unemployed Armed forces operational officer Hx:  unknown Living Situation: homeless Spiritual Hx: unknown Access to weapons/lethal means: Denies   Substance History Alcohol: alcohol dependence  Type of alcohol -unknown Last Drink- unknown Number of drinks per day -unknown History of alcohol withdrawal seizures - History of DT's -hx of DTs Tobacco: unknown Illicit drugs: marijuana every couple of weeks, cocaine about the same, meth per chart hx Prescription drug abuse: unknown Rehab hx: multiple  Exam Findings  Physical Exam:  Vital Signs:  Temp:  [98 F (36.7 C)-99.3 F (37.4 C)] 98.6 F (37 C) (05/17 1242) Pulse Rate:  [72-103] 72 (05/17 1242) Resp:  [16-18] 16 (05/17 1242) BP: (136-146)/(89-115) 136/93 (05/17 1242) SpO2:  [99 %-100 %] 100 % (05/17 1242) Blood pressure (!) 136/93, pulse 72, temperature 98.6 F (37 C), temperature source Oral, resp. rate 16, SpO2 100%. There is no height or weight on file to calculate BMI.  Physical Exam Psychiatric:        Attention and Perception: He is inattentive. He perceives auditory hallucinations.        Mood and Affect: Mood is depressed. Affect is flat.        Speech: Speech is rapid and pressured.        Behavior: Behavior is slowed.        Thought Content: Thought content is paranoid and delusional. Thought content includes suicidal ideation.        Cognition and Memory: Cognition is impaired.        Judgment: Judgment is impulsive.     Mental Status Exam: General Appearance: Disheveled  Orientation:  Other:  self and place  Memory:  Immediate;   Poor  Concentration:  Concentration: Poor  Recall:  Poor  Attention  Poor  Eye Contact:  Poor  Speech:  Slurred  Language:  Fair  Volume: Normal  Mood: Appropriate  Affect:  Labile  Thought Process:  Disorganized and Descriptions of Associations: Loose  Thought Content:  Illogical, Hallucinations: Auditory Visual, and Paranoid Ideation  Suicidal Thoughts:   Yes with no plan or intent  Homicidal Thoughts: Denies   Judgement:  Impaired  Insight:  Lacking  Psychomotor Activity: Decreased  Akathisia:  No  Fund of Knowledge:   unable to assess    Assets:  Others:  unable to assess  Cognition:  Impaired,  Severe  ADL's:  Impaired  AIMS (if indicated):        Other History   These have been pulled in through the EMR, reviewed, and updated if appropriate.  Family History:  The patient's family history includes Cancer in his mother; Diabetes in his father and maternal grandmother; Heart disease in his father and mother; Lung cancer in his mother; Stroke in his paternal grandfather.  Medical History: Past Medical History:  Diagnosis Date   Anxiety    Bipolar 1 disorder (HCC)    Depression    Drug-induced seizure (HCC)    EtOH dependence (HCC)    Hallucination  Paranoid schizophrenia (HCC)    since pt's 20's   PTSD (post-traumatic stress disorder)     Surgical History: Past Surgical History:  Procedure Laterality Date   KIDNEY DONATION Right 2003     Medications:  No current facility-administered medications for this encounter.  Current Outpatient Medications:    haloperidol  (HALDOL ) 5 MG tablet, Take 1 tablet (5 mg total) by mouth 2 (two) times daily., Disp: 60 tablet, Rfl: 0   hydrOXYzine  (ATARAX ) 25 MG tablet, Take 1 tablet (25 mg total) by mouth 3 (three) times daily as needed for anxiety., Disp: 30 tablet, Rfl: 0   thiamine  (VITAMIN B-1) 100 MG tablet, Take 1 tablet (100 mg total) by mouth daily., Disp: 30 tablet, Rfl: 0   traZODone  (DESYREL ) 50 MG tablet, Take 1 tablet (50 mg total) by mouth at bedtime as needed for sleep., Disp: 30 tablet, Rfl: 0  Allergies: Allergies  Allergen Reactions   Penicillins Other (See Comments) and Swelling    Reaction occurred in childhood    Jerry [name], PMHNP

## 2023-08-04 NOTE — ED Provider Notes (Addendum)
 Accepted handoff at shift change from [facility], PA-C. Please see prior provider note for more detail.   Briefly: Patient is 48 y.o. presenting for auditory and visual hallucinations.  History of polysubstance abuse, schizophrenia spectrum disorder, homelessness  DDX: concern for Schizophrenia, substance-induced mood disorder others   Plan: Medical clearance and TTS evaluation   Physical Exam  BP (!) 136/93   Pulse 72   Temp 98.6 F (37 C) (Oral)   Resp 16   SpO2 100%   Physical Exam  Procedures  Procedures  ED Course / MDM   Clinical Course as of 08/04/23 1438  Sat Aug 04, 2023  0634 Needs TTS. Here voluntarily. Used a "little bit of meth". If decides to leave before TTS, need to IVC.  [JR]    Clinical Course User Index [JR] Janalee Mcmurray, PA-C   Medical Decision Making Amount and/or Complexity of Data Reviewed Labs: ordered.  Risk Prescription drug management.   Labs revealing mild hyponatremia but otherwise unremarkable.  He is hemodynamically stable.  EKG also reassuring and nonischemic.  Medically cleared him.  Remains pleasantly cooperative with staff.  Placed in psych hold.  Awaiting TTS evaluation.       Janalee Mcmurray, PA-C 08/04/23 1440    Janalee Mcmurray, PA-C 08/04/23 1441    Wynetta Heckle, MD 08/10/23 2047

## 2023-08-04 NOTE — ED Provider Notes (Signed)
 [name] AT [facility] Provider Note   CSN: [id number] Arrival date & time: 08/04/23  4098     History  Chief Complaint  Patient presents with   Hallucinations    Jerry [name] is a 48 y.o. male.  Patient with past medical history including polysubstance abuse, alcohol abuse, schizophrenia spectrum disorder, methamphetamine abuse, cocaine abuse, malingering, homelessness, substance-induced mood disorder, alcohol withdrawals requiring Precedex  presents to the emergency department via EMS complaining of auditory and visual hallucinations.  He feels that everyone is trying to kill him.  He hears voices that are saying they are going to kill him.  He sees people that are not there that are also saying they are going to kill him.  He does endorse using meth yesterday but denies alcohol consumption.  He states he has not taken any of his psychiatric medications for about 5 months.  He has a history of multiple psychiatric admissions.  He denies chest pain, abdominal pain, nausea, vomiting.  HPI     Home Medications Prior to Admission medications   Medication Sig Start Date End Date Taking? Authorizing Provider  haloperidol  (HALDOL ) 5 MG tablet Take 1 tablet (5 mg total) by mouth 2 (two) times daily. 07/19/23   Ntuen, Tina C, FNP  hydrOXYzine  (ATARAX ) 25 MG tablet Take 1 tablet (25 mg total) by mouth 3 (three) times daily as needed for anxiety. 07/19/23   Ntuen, Tina C, FNP  thiamine  (VITAMIN B-1) 100 MG tablet Take 1 tablet (100 mg total) by mouth daily. 07/20/23   Ntuen, Tina C, FNP  traZODone  (DESYREL ) 50 MG tablet Take 1 tablet (50 mg total) by mouth at bedtime as needed for sleep. 07/19/23   Ntuen, Tina C, FNP      Allergies    Penicillins    Review of Systems   Review of Systems  Physical Exam Updated Vital Signs BP (!) 146/115 (BP Location: Right Arm)   Pulse (!) 103   Temp 98 F (36.7 C)   Resp 18   SpO2 99%  Physical Exam Vitals and nursing  note reviewed.  Constitutional:      General: He is not in acute distress.    Appearance: He is well-developed.  HENT:     Head: Normocephalic and atraumatic.  Eyes:     Conjunctiva/sclera: Conjunctivae normal.  Cardiovascular:     Rate and Rhythm: Normal rate and regular rhythm.     Heart sounds: No murmur heard. Pulmonary:     Effort: Pulmonary effort is normal. No respiratory distress.     Breath sounds: Normal breath sounds.  Abdominal:     Palpations: Abdomen is soft.     Tenderness: There is no abdominal tenderness.  Musculoskeletal:        General: No swelling.     Cervical back: Neck supple.  Skin:    General: Skin is warm and dry.     Capillary Refill: Capillary refill takes less than 2 seconds.     Comments: Excoriations on scalp where patient continues to scratch himself  Neurological:     Mental Status: He is alert.  Psychiatric:     Comments: Patient paranoid, experiencing visual and auditory hallucinations     ED Results / Procedures / Treatments   Labs (all labs ordered are listed, but only abnormal results are displayed) Labs Reviewed  COMPREHENSIVE METABOLIC PANEL WITH GFR  ETHANOL  RAPID URINE DRUG SCREEN, HOSP PERFORMED  CBC WITH DIFFERENTIAL/PLATELET    EKG  None  Radiology No results found.  Procedures Procedures    Medications Ordered in ED Medications  LORazepam  (ATIVAN ) tablet 1 mg (has no administration in time range)    ED Course/ Medical Decision Making/ A&P Clinical Course as of 08/04/23 0640  Sat Aug 04, 2023  0634 Needs TTS. Here voluntarily. Used a "little bit of meth". If decides to leave before TTS, need to IVC.  [JR]    Clinical Course User Index [JR] Janalee Mcmurray, PA-C                                 Medical Decision Making Amount and/or Complexity of Data Reviewed Labs: ordered.  Risk Prescription drug management.   This patient presents to the ED for concern of hallucinations, this involves an extensive  number of treatment options, and is a complaint that carries with it a high risk of complications and morbidity.  The differential diagnosis includes Schizophrenia, substance-induced mood disorder others   Co morbidities that complicate the patient evaluation  History of mental health diagnoses, substance abuse   Additional history obtained:  Additional history obtained from EMS External records from outside source obtained and reviewed including behavioral health notes   Lab Tests:  I Ordered, and personally interpreted labs.  Labs pending at time of shift handoff   Problem List / ED Course / Critical interventions / Medication management   I ordered medication including ativan  for agitation  Reevaluation of the patient after these medicines showed that the patient improved I have reviewed the patients home medicines and have made adjustments as needed   Social Determinants of Health:  Patient is a daily smoker   Test / Admission - Considered:  Patient care being transferred to Valentina Gasman, PA-C at shift handoff. Patient will need TTS evaluation once medically cleared.          Final Clinical Impression(s) / ED Diagnoses Final diagnoses:  None    Rx / DC Orders ED Discharge Orders     None         Delories Fetter 08/04/23 9811    Lindle Rhea, MD 08/04/23 [phone]

## 2023-08-04 NOTE — ED Triage Notes (Signed)
 Pt arrives via [facility], with reports that he was about 30 minutes ago, found [facility]. Hx of schizophrenia. Used meth yesterday, having visual and auditory hallucinations. Keeps seeing people that are saying they are going to kill him. 160/92, hr 96, rr 18, cbg 183

## 2023-08-04 NOTE — ED Notes (Signed)
 Pt has been dressed out into scrubs. Clothes have been placed in the cabinet 16-18 Res, A

## 2023-08-04 NOTE — Progress Notes (Signed)
 Patient has been denied by [facility] due to no appropriate beds available. Patient meets BH inpatient criteria per Chandra Come, PMHNP. Patient has been faxed out to the following facilities:    [facility] CenterPending - Request [address] [place] [phone]--ZHYQM-VHQIO [facility]-AdultPending - Request Sent--[address] [place] [phone]--IHKVQ-QVZ [facility] Behavioral HealthPending - Request [facility], [place] [phone]--SWFUX-NATFTDDU DunesPending - Request [address] [place] [phone]--PXTGG-YIRSWNIO [facility] RidgePending - Request Sent--[address] [place], [place] [phone]--YBOFB-PZWCHE Health-Behavioral Health Patient PlacementPending - Request [facility], [phone]--[facility] - Request [address] Devota Fontan [facility] [phone]--IWPYK-DXI [facility] HealthPending - Request Sent--[address] [place] [phone]--DZHGD-JME Ellan Gunner - Request Sent--[address] [place], [phone]--[facility] HealthPending - Request [address] [place] [phone]--CXKGY-JEHUD Hill Adult CampusPending - Request Sent--[address] [place] [phone]--[facility]NOBSJGG [facility] - Request [address] [place], [place] [phone]--TZGYF-VCBSWHQP SpringsPending - Request [address] [place] [phone]--SPQZR-AQTMAUQ [facility] HealthPending - Request [address] [place]  [phone]--BWIOM-BTDH [facility] CenterPending - Request Sent--[address], [place] [phone]--BBCWU-GQBVQXI [facility] CenterPending - Request Sent--[address] Cecillia Cogan [facility] [phone]--[facility] HealthcarePending - Request [address] [place] [phone]--  Phares Brasher, MSW, LCSW-A  6:09 PM 08/04/2023

## 2023-08-04 NOTE — ED Triage Notes (Signed)
 Pt paranoid during triage, he keeps saying "don't let me die, don't let them kill me, please don't let me die". Pt says he has not taken his medications in about "5 months". Denies ETOH, admits to "a little bit of meth". Denies SI or HI. Pt keeps biting his fingers and scratching the top of his head and noted to have excoriated areas where he keeps scratching his scalp.

## 2023-08-04 NOTE — ED Notes (Signed)
 1 bag of belongings moved to locker 37

## 2023-08-05 DIAGNOSIS — F250 Schizoaffective disorder, bipolar type: Secondary | ICD-10-CM

## 2023-08-05 NOTE — Consult Note (Signed)
 [facility] Psychiatric Consult Initial  Patient Name: .Jerry [name]  MRN: [id number]  DOB: 1975-05-18  Consult Order details:  Orders (From admission, onward)     Start     Ordered   08/04/23 1417  CONSULT TO CALL ACT TEAM       Ordering Provider: Janalee Mcmurray, PA-C  Provider:  (Not yet assigned)  Question:  Reason for Consult?  Answer:  Psych consult   08/04/23 1416             Mode of Visit: In person    Psychiatry Consult Evaluation  Service Date: Aug 05, 2023 LOS:  LOS: 0 days  Chief Complaint  Psychosis   Primary Psychiatric Diagnoses  Polysubstance abuse Schizoaffective disorder, bipolar type Anxiety  Assessment  Jerry [name] is a 48 y.o. male admitted: Presented to the EDfor 08/04/2023  5:29 AM for paranoia, and non compliant with medications for 2 months. Jerry [name] is a 48 y.o. male with medical history significant of alcohol abuse and schizophrenia history of DTs history of polysubstance abuse, GAD tobacco abuse, malingering, PTSD, tardive dyskinesia. Patient was reportedly BIB by [facility] from [facility] for paranoia. Hx of schizophrenia, non-compliant with medications x 2 months. Admits to using meth x 2 days ago which worsened symptoms.    His current presentation of hallucination, paranoia, and meth use is most consistent with possible decompensating mood disorder in the setting of treatment noncompliance. Not currently on any home psychotropics. Last methamphetamine use was reported to be about 3 days ago and he presented paranoid to the hospital. Please see plan below for detailed recommendations.   Diagnoses:  Active Hospital problems: Principal Problem:   Schizoaffective disorder, bipolar type (HCC) Active Problems:   Methamphetamine abuse (HCC)   Substance induced mood disorder (HCC)    Plan   ## Psychiatric Medication Recommendations:  - Continue patient home medications    ## Medical Decision Making Capacity: Not specifically addressed in  this encounter   ## Further Work-up:  -- Updated EKG ordered on Qtc 430 on 08/04/23  -- Pertinent labwork reviewed earlier this admission includes: CBC, NA- 132, UDS     ## Disposition:--We recommend inpatient psychiatric admission, patient currently is voluntary. Patient currently being reviewed at [facility] as of 08/05/23 per TTS SW note.  ## Behavioral / Environmental: -Delirium Precautions: Delirium Interventions for Nursing and Staff: - RN to open blinds every AM. - To Bedside: Glasses, hearing aide, and pt's own shoes. Make available to patients. when possible and encourage use. - Encourage po fluids when appropriate, keep fluids within reach. - OOB to chair with meals. - Passive ROM exercises to all extremities with AM & PM care. - RN to assess orientation to person, time and place QAM and PRN. - Recommend extended visitation hours with familiar family/friends as feasible. - Staff to minimize disturbances at night. Turn off television when pt asleep or when not in use.                ## Safety and Observation Level:  - Based on my clinical evaluation, I estimate the patient to be at high risk of self harm in the current setting. - At this time, we recommend  1:1 Observation. This decision is based on my review of the chart including patient's history and current presentation, interview of the patient, mental status examination, and consideration of suicide risk including evaluating suicidal ideation, plan, intent, suicidal or self-harm behaviors, risk factors, and protective factors. This  judgment is based on our ability to directly address suicide risk, implement suicide prevention strategies, and develop a safety plan while the patient is in the clinical setting. Please contact our team if there is a concern that risk level has changed.   CSSR Risk Category:C-SSRS RISK CATEGORY: No Risk   Suicide Risk Assessment: Patient has following modifiable risk factors for suicide: untreated  depression and medication noncompliance, substance and alcohol use which we are addressing by recommending that patient be admitted to an inpatient psychiatric facility. Patient has following non-modifiable or demographic risk factors for suicide: male gender, history of suicide attempt, and psychiatric hospitalization Patient has the following protective factors against suicide: none identified   Thank you for this consult request. Recommendations have been communicated to the primary team.  We will follow up at this time.   Chandra Come, PMHNP       History of Present Illness  Relevant Aspects of Hospital ED Course:  Admitted on 08/04/2023 for complaining of auditory and visual hallucinations.     Patient Report:  Jerry [name], 48 y.o., male patient seen face to face by this provider, consulted with Dr. Deborah Falling; and chart reviewed on 08/05/23.  On evaluation today, the patient is sitting up in bed, eating lunch and currently appears to be in moderate distress. He is calm, but  seems to be displaying some paranoid behavior.  At one point during assessment, he got up off his bed and walk to his room door, and looked out to see if anyone was looking into his room.  This provider assured him that it was just he and I.  Patient confirmed that he is still having some paranoia, this provider spoke with him about the paranoia that he was demonstrating yesterday, he states "was that real was I tripping."  This provider informed him that he was safe here in the hospital, and that he is behind locked doors, patient states that he feels safe, patient has also been compliant with his medications. His appearance is appropriate for environment. His eye contact is good.  Speech remains a little garbled/slurred but coherent, and does appear the patient does not have any teeth in his mouth, and normal volume. He is alert and oriented x 3 to person, place, and situation.  He reports his mood is doing "okay  "states he has been sleeping a lot throughout the day, affect is congruent with mood.  Thought process is coherent and disorganized. He denies suicidal ideations and homicidal ideations. Appetite and sleep are fair.   Discussed with him plan of care, that he has been recommended to go to an inpatient psychiatric facility and that [facility] is reviewing.  Patient is in agreement.    Psych ROS:  Depression: Endorses Anxiety:  Endorses Mania (lifetime and current): Denies Psychosis: (lifetime and current): Endorses   Collateral information:  Contacted None: patient did not want any collateral called     Review of Systems  Psychiatric/Behavioral:  Positive for depression, hallucinations, substance abuse and suicidal ideas.      Psychiatric and Social History  Psychiatric History:  Information collected from patient, staff and chart review   Prev Dx/Sx: alcohol abuse and schizophrenia history of DTs history of polysubstance abuse, GAD tobacco abuse, malingering, PTSD, tardive dyskinesia Current Psych Provider: Dr. Ethel Henry outpatient hx, currently unknown Home Meds (current):  Previous Med Trials: risperidone   Therapy: hx therapist-Brittney Fulton Job, currently unknown    Prior Psych Hospitalization: About 3 lifetime psychiatric hospitalizations Several lifetime  episodes of suicidal ideation/attempts, including jumping in front of traffic at least once.  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 Living Situation: homeless Spiritual Hx: unknown Access to weapons/lethal means: Denies   Substance History Alcohol: alcohol dependence  Type of alcohol -unknown Last Drink- unknown Number of drinks per day -unknown History of alcohol withdrawal seizures - History of DT's -hx of DTs Tobacco: unknown Illicit drugs:  marijuana every couple of weeks, cocaine about the same, meth per chart hx Prescription drug abuse: unknown Rehab hx: multiple  Exam Findings  Physical Exam:  Vital Signs:  Temp:  [97.5 F (36.4 C)-98 F (36.7 C)] 98 F (36.7 C) (05/18 0547) Pulse Rate:  [61-82] 61 (05/18 0547) Resp:  [15-17] 17 (05/18 0547) BP: (117-130)/(84-90) 117/84 (05/18 0547) SpO2:  [97 %-100 %] 100 % (05/18 0547) Weight:  [72.6 kg] 72.6 kg (05/17 1935) Blood pressure 117/84, pulse 61, temperature 98 F (36.7 C), resp. rate 17, height 5\' 9"  (1.753 m), weight 72.6 kg, SpO2 100%. Body mass index is 23.63 kg/m.  Physical Exam Psychiatric:        Attention and Perception: He is inattentive. He perceives auditory hallucinations.        Mood and Affect: Mood is depressed. Affect is flat.        Speech: Speech is rapid and pressured.        Behavior: Behavior is slowed.        Thought Content: Thought content is paranoid and delusional. Thought content includes suicidal ideation.        Cognition and Memory: Cognition is impaired.        Judgment: Judgment is impulsive.     Mental Status Exam: General Appearance: Casual  Orientation:  Other:  self and place, situation  Memory:  Immediate;   Poor  Concentration:  Concentration: Fair  Recall: Fair  Attention fair  Eye Contact: Minimal  Speech:  Slurred  Language:  Fair  Volume: Normal  Mood: Appropriate  Affect: Calm and cooperative  Thought Process:  Disorganized   Thought Content:  Paranoid Ideation  Suicidal Thoughts:   Yes with no plan or intent  Homicidal Thoughts: Denies  Judgement: Poor  Insight:  Lacking  Psychomotor Activity: Decreased  Akathisia:  No  Fund of Knowledge:   unable to assess    Assets: Communication and support  Cognition: Impaired, mild  ADL's: Intact  AIMS (if indicated):        Other History   These have been pulled in through the EMR, reviewed, and updated if appropriate.  Family History:  The patient's family  history includes Cancer in his mother; Diabetes in his father and maternal grandmother; Heart disease in his father and mother; Lung cancer in his mother; Stroke in his paternal grandfather.  Medical History: Past Medical History:  Diagnosis Date   Anxiety    Bipolar 1 disorder (HCC)    Depression    Drug-induced seizure (HCC)    EtOH dependence (HCC)    Hallucination    Paranoid schizophrenia (HCC)    since pt's 20's   PTSD (post-traumatic stress disorder)     Surgical History: Past Surgical History:  Procedure Laterality Date   KIDNEY DONATION Right 2003     Medications:   Current Facility-Administered Medications:    haloperidol  (HALDOL ) tablet 5 mg, 5 mg, Oral, BID, Motley-Mangrum, Brandalynn Ofallon  A, PMHNP, 5 mg at 08/05/23 0939   haloperidol  lactate (HALDOL ) injection 5 mg, 5 mg, Intramuscular, Q6H PRN, Motley-Mangrum, Ellouise Mcwhirter A, PMHNP, 5 mg at 08/04/23 1811   hydrOXYzine  (ATARAX ) tablet 25 mg, 25 mg, Oral, TID PRN, Motley-Mangrum, Quaniyah Bugh A, PMHNP, 25 mg at 08/04/23 1806   LORazepam  (ATIVAN ) injection 2 mg, 2 mg, Intramuscular, Q6H PRN, Motley-Mangrum, Alisan Dokes A, PMHNP, 2 mg at 08/04/23 1812   thiamine  (VITAMIN B1) tablet 100 mg, 100 mg, Oral, Daily, Motley-Mangrum, Wasim Hurlbut A, PMHNP, 100 mg at 08/05/23 4098   traZODone  (DESYREL ) tablet 50 mg, 50 mg, Oral, QHS PRN, Motley-Mangrum, Quinlee Sciarra A, PMHNP  Current Outpatient Medications:    haloperidol  (HALDOL ) 5 MG tablet, Take 1 tablet (5 mg total) by mouth 2 (two) times daily., Disp: 60 tablet, Rfl: 0   hydrOXYzine  (ATARAX ) 25 MG tablet, Take 1 tablet (25 mg total) by mouth 3 (three) times daily as needed for anxiety., Disp: 30 tablet, Rfl: 0   thiamine  (VITAMIN B-1) 100 MG tablet, Take 1 tablet (100 mg total) by mouth daily., Disp: 30 tablet, Rfl: 0   traZODone  (DESYREL ) 50 MG tablet, Take 1 tablet (50 mg total) by mouth at bedtime as needed for sleep., Disp: 30 tablet, Rfl: 0  Allergies: Allergies  Allergen Reactions   Penicillins Other  (See Comments) and Swelling    Reaction occurred in childhood    Hjalmer Iovino MOTLEY-MANGRUM, PMHNP

## 2023-08-05 NOTE — Progress Notes (Signed)
 CSW sent additional BH referral information to [facility] for continued review. CSW will continue to monitor the patient to secure recommended disposition.    Phares Brasher, MSW, LCSW-A  11:39 AM 08/05/2023

## 2023-08-05 NOTE — ED Provider Notes (Signed)
 Emergency Medicine Observation Re-evaluation Note  Jerry [name] is a 48 y.o. male, seen on rounds today.  Pt initially presented to the ED for complaints of Hallucinations Currently, the patient is resting.  Physical Exam  BP 117/84   Pulse 61   Temp 98 F (36.7 C)   Resp 17   Ht 5\' 9"  (1.753 m)   Wt 72.6 kg   SpO2 100%   BMI 23.63 kg/m  Physical Exam General: resting comfortably, NAD Lungs: normal WOB Psych: currently calm and resting  ED Course / MDM  EKG:EKG Interpretation Date/Time:  Saturday Aug 04 2023 06:59:46 EDT Ventricular Rate:  85 PR Interval:  140 QRS Duration:  98 QT Interval:  362 QTC Calculation: 430 R Axis:   101  Text Interpretation: Normal sinus rhythm with sinus arrhythmia Rightward axis Borderline ECG Confirmed by Eldon Greenland ([id number]) on 08/05/2023 10:30:47 AM  I have reviewed the labs performed to date as well as medications administered while in observation.  Recent changes in the last 24 hours include none.  Plan  Current plan is for inpatient psychiatric placement.    Flonnie Humphrey, DO 08/05/23 1318

## 2023-08-05 NOTE — ED Notes (Signed)
 Pt ate breakfast and pt now sleeping

## 2023-08-05 NOTE — Progress Notes (Signed)
 Patient has been denied by [facility] due to no appropriate beds available. Patient meets BH inpatient criteria per Chandra Come, PMHNP. Patient has been faxed out to the following facilities:   [facility] [address] [place] [address] [phone]  [facility] Center-Adult [address] Johnella Naas [place] [address] [phone]  [facility] [address], [place] [address] [phone]  [facility] [address], [place] [address] [phone]  [facility] [address] [place], [place] [address] [phone]  [facility] Health Patient Placement [facility], [place] [phone]  [facility] [address] [place] [address] [phone]  [facility] [address] [place] [address] [phone]  [facility] EFAX [address], [place] [phone]  [facility] [address] [place] [address] [phone]  [facility] [address] [place] [address] [phone]  [facility] [address] [place], [place] [address] [phone]  [facility] [address] [place] [address] [phone]  [facility] [address] [place] [address] [phone]  [facility] [address] [place] [address] [phone]  [facility] [address] [place] [address] [phone]  [facility] [address] [place] [address]  [phone]   Phares Brasher, MSW, LCSW-A  10:24 AM 08/05/2023

## 2023-08-05 NOTE — Progress Notes (Signed)
 CSW spoke with the Intake RN at [facility] who has agreed to review the [facility] referral for potential placement. CSW sent additional requested BH information via fax for review. CSW will continue to monitor the patient to secure recommended disposition.   Wenzel Backlund, MSW, LCSW-A  5:36 PM 08/05/2023

## 2023-08-06 LAB — RAPID URINE DRUG SCREEN, HOSP PERFORMED
Amphetamines: POSITIVE — AB
Barbiturates: NOT DETECTED
Benzodiazepines: POSITIVE — AB
Cocaine: POSITIVE — AB
Opiates: NOT DETECTED
Tetrahydrocannabinol: NOT DETECTED

## 2023-08-06 NOTE — ED Notes (Signed)
 After coming out of restroom pt requested coke. Pt was given diet cola. Pt ambulated back to room. Tech cleared additional cups in room (2 cups).

## 2023-08-06 NOTE — ED Notes (Signed)
 Pt has had 1 of 3 phone calls

## 2023-08-06 NOTE — Progress Notes (Signed)
 Pt has been accepted to [facility] TOMORROW (08/07/2023 Bed assignment: Main campus  Pt meets inpatient criteria per: Chandra Come, PMHNP   Attending Physician will be Lavona Pounds, MD  Report can be called to: [phone] (this is a pager, please leave call-back number when giving report)  Pt can arrive after 8 AM  Care Team Notified: Arvell Latin NP, Cletus Dakins RN, Patt Boozer NT   [place] Rogerio Boutelle LCSW-A   08/06/2023 2:44 PM

## 2023-08-06 NOTE — ED Provider Notes (Signed)
 Emergency Medicine Observation Re-evaluation Note  Jerry [name] is a 48 y.o. male, seen on rounds today.  Pt initially presented to the ED for complaints of Hallucinations Currently, the patient is sleeping.  Physical Exam  BP 100/65 (BP Location: Right Arm)   Pulse (!) 57   Temp 97.8 F (36.6 C) (Oral)   Resp 12   Ht 5\' 9"  (1.753 m)   Wt 72.6 kg   SpO2 100%   BMI 23.63 kg/m  Physical Exam General: Sleeping Cardiac: Extremities well-perfused Lungs: Breathing is unlabored Psych: Deferred  ED Course / MDM  EKG:EKG Interpretation Date/Time:  Saturday Aug 04 2023 06:59:46 EDT Ventricular Rate:  85 PR Interval:  140 QRS Duration:  98 QT Interval:  362 QTC Calculation: 430 R Axis:   101  Text Interpretation: Normal sinus rhythm with sinus arrhythmia Rightward axis Borderline ECG Confirmed by Eldon Greenland ([id number]) on 08/05/2023 10:30:47 AM  I have reviewed the labs performed to date as well as medications administered while in observation.  Recent changes in the last 24 hours include none.  Plan  Current plan is for inpatient psychiatric admission.    Iva Mariner, MD 08/06/23 [phone]

## 2023-08-06 NOTE — Progress Notes (Signed)
 LCSW Progress Note  [id number]   Jerry [name]  08/06/2023  2:26 PM  Description:   Inpatient Psychiatric Referral  Patient was recommended inpatient per Jadeka Motley-Mangrum, PMHNP . There are no available beds at [facility], per [facility] Kathryn Parish RN. Patient was referred to the following out of network facilities:   Destination  Service Provider Address Phone Fax  [facility] [address] [place]., [place] [address] [phone]  [facility] [address] Johnella Naas [place] [address] [phone]  [facility] [address], [place] [address] [phone]  [facility] [address] [place] [address] [phone]  [facility] [place] [address] [place], [place] [address] [phone]  [facility] Health Patient Placement [facility], [place] [phone]  [facility] [address] [place] [address] [phone]  [facility] [address] [place] [address] [phone]  [facility] EFAX [address] [place] [phone]  [facility] [address], [place] [address] [phone]  [facility] [address] [place] [address] [phone]  [facility] [address] [place], [place] [address] [phone]  [facility] [address] [place] [address] [phone]  [facility] [address] [place] [address] [phone]  [facility] [address] [place] [address] [phone]  [facility] [address] [place] [address] [phone]  [facility] [address] [place] [address] [phone]      Situation ongoing, CSW to continue following and update chart as more information becomes available.      [place] Elayna Tobler, MSW, LCSW  08/06/2023 2:26 PM

## 2023-08-06 NOTE — ED Notes (Signed)
 Pt ambulated to restroom.

## 2023-08-07 NOTE — ED Provider Notes (Signed)
 Patient was reassessed by behavioral health and psychiatry team this morning and felt to be significantly improved from a mood and psychiatric standpoint, stable for outpatient follow-up.  He is no longer endorsing hallucinations.  Patient is made aware of the dangers of using amphetamines and cocaine as a trigger for mental health crisis.    Arvilla Birmingham, MD 08/07/23 1031

## 2023-08-07 NOTE — ED Notes (Signed)
 Pt had pleasant shift, however was withdrawn, sad, anxious. Pt medication compliant, no aggressive behavior noted, no PRN for aggression/agitation were indicated.

## 2023-08-07 NOTE — Consult Note (Signed)
 [facility] Consult Follow-up  Patient Name: .Jerry [name]  MRN: [id number]  DOB: 1975/07/04  Consult Order details:  Orders (From admission, onward)     Start     Ordered   08/04/23 1417  CONSULT TO CALL ACT TEAM       Ordering Provider: Janalee Mcmurray, PA-C  Provider:  (Not yet assigned)  Question:  Reason for Consult?  Answer:  Psych consult   08/04/23 1416             Mode of Visit: In person    Psychiatry Consult Evaluation  Service Date: Aug 07, 2023 LOS:  LOS: 0 days  Chief Complaint  Psychosis-substance abuse    Primary Psychiatric Diagnoses  Polysubstance abuse Schizoaffective disorder, bipolar type Anxiety  Assessment  Jerry [name] is [name] admitted: Presented to the EDfor 08/04/2023  5:29 AM for paranoia, and non compliant with medications for 2 months. Jerry [name] is [name] with medical history significant of alcohol abuse and schizophrenia history of DTs history of polysubstance abuse, GAD tobacco abuse, malingering, PTSD, tardive dyskinesia. Patient was reportedly BIB by [facility] from [facility] for paranoia. Hx of schizophrenia, non-compliant with medications x 2 months. Admits to using meth x 2 days ago which worsened symptoms.    His current presentation of hallucination, paranoia, and meth use is most consistent with possible decompensating mood disorder in the setting of treatment noncompliance. Not currently on any home psychotropics. Last methamphetamine use was reported to be about 3 days ago and he presented paranoid to the hospital. Please see plan below for detailed recommendations.   Diagnoses:  Active Hospital problems: Principal Problem:   Schizoaffective disorder, bipolar type (HCC) Active Problems:   Methamphetamine abuse (HCC)   Substance induced mood disorder (HCC)    Plan   ## Psychiatric Medication Recommendations:  - Continue patient home medications  Please follow up with [facility]  provider whom you have been seeing. Abstain from illicit drug use Engage in outpatient substance abuse treatment and therapy   ## Medical Decision Making Capacity: Not specifically addressed in this encounter   ## Further Work-up:  -- Updated EKG ordered on Qtc 430 on 08/04/23  -- Pertinent labwork reviewed earlier this admission includes: CBC, NA- 132, UDS     ## Disposition:--  Patient is Psychiatrically cleared  to go home as he declined inpatient Psychiatry unit admission offer at [facility].  Patient denies any mention of paranoia or AVH this morning. ## Behavioral / Environmental: -Not addressed             ## Safety and Observation Level:  - Based on my clinical evaluation, I estimate the patient to be at No risk of self harm in the current setting. - At this time, we recommend  routine Observation. This decision is based on my review of the chart including patient's history and current presentation, interview of the patient, mental status examination, and consideration of suicide risk including evaluating suicidal ideation, plan, intent, suicidal or self-harm behaviors, risk factors, and protective factors. This judgment is based on our ability to directly address suicide risk, implement suicide prevention strategies, and develop [name] safety plan while the patient is in the clinical setting. Please contact our team if there is [name] concern that risk level has changed.   CSSR Risk Category:C-SSRS RISK CATEGORY: No Risk   Suicide Risk Assessment: Patient has following modifiable risk factors for suicide: untreated depression and medication noncompliance,  substance and alcohol use which we are addressing by recommending that patient be admitted to an inpatient psychiatric facility. Patient has following non-modifiable or demographic risk factors for suicide: male gender, history of suicide attempt, and psychiatric hospitalization Patient has the following protective factors against suicide: none  identified   Thank you for this consult request. Recommendations have been communicated to the primary team.  We will psychiatrically clear this patient at this time.   Jerry [name], PMHNP       History of Present Illness  Relevant Aspects of Hospital ED Course:  Admitted on 08/04/2023 for complaining of auditory and visual hallucinations.     Patient Report:  Jerry [name], 48 y.o who  was brought in by EMS [name]  with complaint of Visual and Auditory hallucination after he used Amphetamine and Cocaine.  Patient, this morning engaged in meaningful conversation.  Patient is surprised that his urine had Amphetamine in it.  He admitted he used Cocaine.  Patient said this time he is going to seriously look into substance abuse treatment.  He also declined admission to [facility] Psychiatry unit for treatment of AVH stating he no longer sees or hears things.,   Patient plans to make appointment to see his outpatient Psychiatry provider at [facility].  Patient is here voluntarily and have metabolized the substances in the system.  He has been sleeping since his arrival in the ER.  He eats and takes his Medications and falls back asleep. This morning he is fully awake, alert and oriented x4.  He denies SI/HI/AVH.  Patient states he need to go back to his wife and take care of her.  Patient also has agreed to seek outpatient Substance abuse treatment.  We reviewed safety plan-call 911 or [phone] for Mental health emergency including but not limited to Suicide ideation or thought.  Patient is encouraged to keep regular appointments with his Psychiatrist.  Patient verbalized understanding and is Psychiatrically cleared. Psych ROS:  Depression: Endorses Anxiety:  Endorses Mania (lifetime and current): Denies Psychosis: (lifetime and current): Endorses   Collateral information:  Contacted None: patient did not want any collateral called     Review of Systems  Constitutional: Negative.    HENT: Negative.    Eyes: Negative.   Respiratory: Negative.    Cardiovascular: Negative.   Gastrointestinal: Negative.   Genitourinary: Negative.   Musculoskeletal: Negative.   Skin: Negative.   Neurological: Negative.   Endo/Heme/Allergies: Negative.   Psychiatric/Behavioral:  Positive for depression and substance abuse.      Psychiatric and Social History  Psychiatric History:  Information collected from patient, staff and chart review   Prev Dx/Sx: alcohol abuse and schizophrenia history of DTs history of polysubstance abuse, GAD tobacco abuse, malingering, PTSD, tardive dyskinesia Current Psych Provider: Dr. Ethel Henry outpatient hx, currently unknown Home Meds (current):  Previous Med Trials: risperidone   Therapy: hx therapist-Brittney Clark, currently unknown    Prior Psych Hospitalization: About 3 lifetime psychiatric hospitalizations Several lifetime episodes of suicidal ideation/attempts, including jumping in front of traffic at least once.  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 Living Situation: homeless Spiritual Hx: unknown Access to weapons/lethal means: Denies   Substance History Alcohol: alcohol dependence  Type of alcohol -unknown Last Drink- unknown Number of drinks per day -unknown History of alcohol withdrawal seizures - History of DT's -  hx of DTs Tobacco: unknown Illicit drugs: marijuana every couple of weeks, cocaine about the same, meth per chart hx Prescription drug abuse: unknown Rehab hx: multiple  Exam Findings  Physical Exam:  Vital Signs:  Temp:  [98 F (36.7 C)-98.4 F (36.9 C)] 98.4 F (36.9 C) (05/20 0601) Pulse Rate:  [60-112] 112 (05/20 0601) Resp:  [16-18] 16 (05/20 0601) BP: (96-121)/(66-74) 96/74 (05/20 0601) SpO2:  [99 %-100 %] 100 % (05/20 0601) Blood  pressure 96/74, pulse (!) 112, temperature 98.4 F (36.9 C), temperature source Oral, resp. rate 16, height 5\' 9"  (1.753 m), weight 72.6 kg, SpO2 100%. Body mass index is 23.63 kg/m.  Physical Exam Constitutional:      Appearance: Normal appearance.  HENT:     Nose: Nose normal.  Pulmonary:     Effort: Pulmonary effort is normal.  Musculoskeletal:        General: Normal range of motion.  Skin:    General: Skin is dry.  Neurological:     Mental Status: He is alert and oriented to person, place, and time.  Psychiatric:        Mood and Affect: Mood is depressed.        Speech: Speech normal.        Behavior: Behavior normal. Behavior is cooperative.        Thought Content: Thought content normal.        Cognition and Memory: Cognition and memory normal.     Mental Status Exam: General Appearance: Casual  Orientation:  Other:  self and place, situation  Memory:  Immediate;  Good  Concentration:  Concentration: good  Recall: Good  Attention Good  Eye Contact: good  Speech:  normal,   Language:  good  Volume: Normal  Mood: Appropriate  Affect: Calm and cooperative  Thought Process:  Logical  Thought Content:    Suicidal Thoughts:   Denies  Homicidal Thoughts: Denies  Judgement: Good  Insight:  Good  Psychomotor Activity: Decreased  Akathisia:  No  Fund of Knowledge:  Good    Assets: Communication and support  Cognition:WNL  ADL's: Intact  AIMS (if indicated):        Other History   These have been pulled in through the EMR, reviewed, and updated if appropriate.  Family History:  The patient's family history includes Cancer in his mother; Diabetes in his father and maternal grandmother; Heart disease in his father and mother; Lung cancer in his mother; Stroke in his paternal grandfather.  Medical History: Past Medical History:  Diagnosis Date   Anxiety    Bipolar 1 disorder (HCC)    Depression    Drug-induced seizure (HCC)    EtOH dependence (HCC)     Hallucination    Paranoid schizophrenia (HCC)    since pt's 20's   PTSD (post-traumatic stress disorder)     Surgical History: Past Surgical History:  Procedure Laterality Date   KIDNEY DONATION Right 2003     Medications:   Current Facility-Administered Medications:    haloperidol  (HALDOL ) tablet 5 mg, 5 mg, Oral, BID, Motley-Mangrum, Jerry [name], PMHNP, 5 mg at 08/07/23 0930   haloperidol  lactate (HALDOL ) injection 5 mg, 5 mg, Intramuscular, Q6H PRN, Motley-Mangrum, Jerry [name], PMHNP, 5 mg at 08/04/23 1811   hydrOXYzine  (ATARAX ) tablet 25 mg, 25 mg, Oral, TID PRN, Motley-Mangrum, Jerry [name], PMHNP, 25 mg at 08/05/23 2233   LORazepam  (ATIVAN ) injection 2 mg, 2 mg, Intramuscular, Q6H PRN, Motley-Mangrum, Jerry [name], PMHNP, 2 mg at 08/04/23 1812  thiamine  (VITAMIN B1) tablet 100 mg, 100 mg, Oral, Daily, Motley-Mangrum, Jerry [name], PMHNP, 100 mg at 08/07/23 0930   traZODone  (DESYREL ) tablet 50 mg, 50 mg, Oral, QHS PRN, Motley-Mangrum, Jerry [name], PMHNP, 50 mg at 08/06/23 2117  Current Outpatient Medications:    haloperidol  (HALDOL ) 5 MG tablet, Take 1 tablet (5 mg total) by mouth 2 (two) times daily. (Patient not taking: Reported on 08/06/2023), Disp: 60 tablet, Rfl: 0   hydrOXYzine  (ATARAX ) 25 MG tablet, Take 1 tablet (25 mg total) by mouth 3 (three) times daily as needed for anxiety. (Patient not taking: Reported on 08/06/2023), Disp: 30 tablet, Rfl: 0   thiamine  (VITAMIN B-1) 100 MG tablet, Take 1 tablet (100 mg total) by mouth daily. (Patient not taking: Reported on 08/06/2023), Disp: 30 tablet, Rfl: 0   traZODone  (DESYREL ) 50 MG tablet, Take 1 tablet (50 mg total) by mouth at bedtime as needed for sleep. (Patient not taking: Reported on 08/06/2023), Disp: 30 tablet, Rfl: 0  Allergies: Allergies  Allergen Reactions   Penicillins Other (See Comments) and Swelling    Reaction occurred in childhood    Alfreida Inches, NP-PMHNP-BC

## 2023-08-07 NOTE — Discharge Instructions (Signed)
 Please do NOT use any street drugs like cocaine, methamphetamines, marijuana, or other drugs.  These can seriously worsen your mental health problems.

## 2023-09-04 ENCOUNTER — Other Ambulatory Visit: Payer: Self-pay

## 2023-09-04 ENCOUNTER — Emergency Department (HOSPITAL_COMMUNITY)
Admission: EM | Admit: 2023-09-04 | Discharge: 2023-09-05 | Disposition: A | Discharge status: Other Acute Inpatient Hospital | Payer: MEDICAID | Attending: Emergency Medicine | Admitting: Emergency Medicine

## 2023-09-04 ENCOUNTER — Encounter (HOSPITAL_COMMUNITY): Payer: Self-pay

## 2023-09-04 DIAGNOSIS — F1020 Alcohol dependence, uncomplicated: Secondary | ICD-10-CM | POA: Diagnosis present

## 2023-09-04 DIAGNOSIS — G2401 Drug induced subacute dyskinesia: Secondary | ICD-10-CM | POA: Diagnosis not present

## 2023-09-04 DIAGNOSIS — Y900 Blood alcohol level of less than 20 mg/100 ml: Secondary | ICD-10-CM | POA: Diagnosis not present

## 2023-09-04 DIAGNOSIS — E871 Hypo-osmolality and hyponatremia: Secondary | ICD-10-CM | POA: Insufficient documentation

## 2023-09-04 DIAGNOSIS — F10932 Alcohol use, unspecified with withdrawal with perceptual disturbance: Secondary | ICD-10-CM

## 2023-09-04 DIAGNOSIS — F418 Other specified anxiety disorders: Secondary | ICD-10-CM | POA: Insufficient documentation

## 2023-09-04 DIAGNOSIS — F332 Major depressive disorder, recurrent severe without psychotic features: Secondary | ICD-10-CM | POA: Diagnosis not present

## 2023-09-04 DIAGNOSIS — E876 Hypokalemia: Secondary | ICD-10-CM | POA: Diagnosis not present

## 2023-09-04 DIAGNOSIS — R4781 Slurred speech: Secondary | ICD-10-CM | POA: Insufficient documentation

## 2023-09-04 DIAGNOSIS — F1090 Alcohol use, unspecified, uncomplicated: Secondary | ICD-10-CM | POA: Diagnosis present

## 2023-09-04 DIAGNOSIS — G40909 Epilepsy, unspecified, not intractable, without status epilepticus: Secondary | ICD-10-CM | POA: Diagnosis not present

## 2023-09-04 DIAGNOSIS — F1420 Cocaine dependence, uncomplicated: Secondary | ICD-10-CM | POA: Diagnosis not present

## 2023-09-04 DIAGNOSIS — F10232 Alcohol dependence with withdrawal with perceptual disturbance: Secondary | ICD-10-CM | POA: Diagnosis not present

## 2023-09-04 DIAGNOSIS — R4587 Impulsiveness: Secondary | ICD-10-CM | POA: Diagnosis not present

## 2023-09-04 DIAGNOSIS — Z91148 Patient's other noncompliance with medication regimen for other reason: Secondary | ICD-10-CM | POA: Diagnosis not present

## 2023-09-04 DIAGNOSIS — F1914 Other psychoactive substance abuse with psychoactive substance-induced mood disorder: Secondary | ICD-10-CM | POA: Insufficient documentation

## 2023-09-04 DIAGNOSIS — F1910 Other psychoactive substance abuse, uncomplicated: Secondary | ICD-10-CM | POA: Diagnosis present

## 2023-09-04 DIAGNOSIS — F411 Generalized anxiety disorder: Secondary | ICD-10-CM | POA: Diagnosis not present

## 2023-09-04 DIAGNOSIS — F1424 Cocaine dependence with cocaine-induced mood disorder: Secondary | ICD-10-CM | POA: Insufficient documentation

## 2023-09-04 DIAGNOSIS — F29 Unspecified psychosis not due to a substance or known physiological condition: Secondary | ICD-10-CM | POA: Diagnosis not present

## 2023-09-04 DIAGNOSIS — F1414 Cocaine abuse with cocaine-induced mood disorder: Secondary | ICD-10-CM | POA: Diagnosis present

## 2023-09-04 DIAGNOSIS — F4310 Post-traumatic stress disorder, unspecified: Secondary | ICD-10-CM | POA: Diagnosis not present

## 2023-09-04 DIAGNOSIS — T50996A Underdosing of other drugs, medicaments and biological substances, initial encounter: Secondary | ICD-10-CM | POA: Diagnosis not present

## 2023-09-04 DIAGNOSIS — Z5900 Homelessness unspecified: Secondary | ICD-10-CM | POA: Insufficient documentation

## 2023-09-04 DIAGNOSIS — F17210 Nicotine dependence, cigarettes, uncomplicated: Secondary | ICD-10-CM | POA: Diagnosis not present

## 2023-09-04 DIAGNOSIS — F209 Schizophrenia, unspecified: Secondary | ICD-10-CM | POA: Diagnosis not present

## 2023-09-04 DIAGNOSIS — Z91128 Patient's intentional underdosing of medication regimen for other reason: Secondary | ICD-10-CM | POA: Diagnosis not present

## 2023-09-04 DIAGNOSIS — F1490 Cocaine use, unspecified, uncomplicated: Secondary | ICD-10-CM

## 2023-09-04 DIAGNOSIS — Z720 Tobacco use: Secondary | ICD-10-CM | POA: Insufficient documentation

## 2023-09-04 DIAGNOSIS — R251 Tremor, unspecified: Secondary | ICD-10-CM | POA: Insufficient documentation

## 2023-09-04 DIAGNOSIS — L02511 Cutaneous abscess of right hand: Secondary | ICD-10-CM | POA: Diagnosis not present

## 2023-09-04 DIAGNOSIS — F1994 Other psychoactive substance use, unspecified with psychoactive substance-induced mood disorder: Secondary | ICD-10-CM | POA: Diagnosis present

## 2023-09-04 LAB — COMPREHENSIVE METABOLIC PANEL WITH GFR
ALT: 48 U/L — ABNORMAL HIGH (ref 0–44)
AST: 74 U/L — ABNORMAL HIGH (ref 15–41)
Albumin: 3.2 g/dL — ABNORMAL LOW (ref 3.5–5.0)
Alkaline Phosphatase: 66 U/L (ref 38–126)
Anion gap: 10 (ref 5–15)
BUN: 8 mg/dL (ref 6–20)
CO2: 21 mmol/L — ABNORMAL LOW (ref 22–32)
Calcium: 8.1 mg/dL — ABNORMAL LOW (ref 8.9–10.3)
Chloride: 97 mmol/L — ABNORMAL LOW (ref 98–111)
Creatinine, Ser: 0.86 mg/dL (ref 0.61–1.24)
GFR, Estimated: >60 mL/min (ref >60)
Glucose, Bld: 132 mg/dL — ABNORMAL HIGH (ref 70–99)
Potassium: 3 mmol/L — ABNORMAL LOW (ref 3.5–5.1)
Sodium: 128 mmol/L — ABNORMAL LOW (ref 135–145)
Total Bilirubin: 0.7 mg/dL (ref 0.0–1.2)
Total Protein: 7.9 g/dL (ref 6.5–8.1)

## 2023-09-04 LAB — CBC WITH DIFFERENTIAL/PLATELET
Abs Immature Granulocytes: 0.03 10*3/uL (ref 0.00–0.07)
Basophils Absolute: 0 10*3/uL (ref 0.0–0.1)
Basophils Relative: 0 %
Eosinophils Absolute: 0 10*3/uL (ref 0.0–0.5)
Eosinophils Relative: 0 %
HCT: 38.8 % — ABNORMAL LOW (ref 39.0–52.0)
Hemoglobin: 13.2 g/dL (ref 13.0–17.0)
Immature Granulocytes: 0 %
Lymphocytes Relative: 7 %
Lymphs Abs: 0.7 10*3/uL (ref 0.7–4.0)
MCH: 33 pg (ref 26.0–34.0)
MCHC: 34 g/dL (ref 30.0–36.0)
MCV: 97 fL (ref 80.0–100.0)
Monocytes Absolute: 1.1 10*3/uL — ABNORMAL HIGH (ref 0.1–1.0)
Monocytes Relative: 10 %
Neutro Abs: 8.6 10*3/uL — ABNORMAL HIGH (ref 1.7–7.7)
Neutrophils Relative %: 83 %
Platelets: 276 10*3/uL (ref 150–400)
RBC: 4 MIL/uL — ABNORMAL LOW (ref 4.22–5.81)
RDW: 13.2 % (ref 11.5–15.5)
WBC: 10.4 10*3/uL (ref 4.0–10.5)
nRBC: 0 % (ref 0.0–0.2)

## 2023-09-04 LAB — RAPID URINE DRUG SCREEN, HOSP PERFORMED
Amphetamines: NOT DETECTED
Barbiturates: NOT DETECTED
Benzodiazepines: NOT DETECTED
Cocaine: POSITIVE — AB
Opiates: NOT DETECTED
Tetrahydrocannabinol: NOT DETECTED

## 2023-09-04 LAB — ETHANOL: Alcohol, Ethyl (B): <15 mg/dL (ref <15)

## 2023-09-04 MED ORDER — LORAZEPAM 1 MG PO TABS: 0.0000 mg | ORAL_TABLET | Freq: Two times a day (BID) | ORAL | Status: DC

## 2023-09-04 MED ORDER — LORAZEPAM 2 MG/ML IJ SOLN: 0.0000 mg | Freq: Two times a day (BID) | INTRAMUSCULAR | Status: DC

## 2023-09-04 MED ORDER — LORAZEPAM 2 MG/ML IJ SOLN
2.0000 mg | Freq: Once | INTRAMUSCULAR | Status: AC
  Administered 2023-09-04: 2 mg via INTRAVENOUS
  Filled 2023-09-04: qty 1

## 2023-09-04 MED ORDER — GABAPENTIN 300 MG PO CAPS
300.0000 mg | ORAL_CAPSULE | Freq: Three times a day (TID) | ORAL | Status: DC
  Administered 2023-09-04: 300 mg via ORAL
  Filled 2023-09-04: qty 1

## 2023-09-04 MED ORDER — OLANZAPINE 5 MG PO TBDP
5.0000 mg | ORAL_TABLET | Freq: Every day | ORAL | Status: DC
  Administered 2023-09-04: 5 mg via ORAL
  Filled 2023-09-04: qty 1

## 2023-09-04 MED ORDER — LORAZEPAM 1 MG PO TABS: 0.0000 mg | ORAL_TABLET | Freq: Four times a day (QID) | ORAL | Status: DC

## 2023-09-04 MED ORDER — THIAMINE MONONITRATE 100 MG PO TABS: 100.0000 mg | ORAL_TABLET | Freq: Every day | ORAL | Status: DC

## 2023-09-04 MED ORDER — LACTATED RINGERS IV BOLUS
1000.0000 mL | Freq: Once | INTRAVENOUS | Status: AC
  Administered 2023-09-04: 1000 mL via INTRAVENOUS

## 2023-09-04 MED ORDER — LORAZEPAM 2 MG/ML IJ SOLN
0.0000 mg | Freq: Four times a day (QID) | INTRAMUSCULAR | Status: DC
  Administered 2023-09-04: 2 mg via INTRAVENOUS
  Filled 2023-09-04: qty 1

## 2023-09-04 MED ORDER — POTASSIUM CHLORIDE CRYS ER 20 MEQ PO TBCR
40.0000 meq | EXTENDED_RELEASE_TABLET | Freq: Once | ORAL | Status: AC
  Administered 2023-09-04: 40 meq via ORAL
  Filled 2023-09-04: qty 2

## 2023-09-04 MED ORDER — THIAMINE HCL 100 MG/ML IJ SOLN
100.0000 mg | Freq: Every day | INTRAMUSCULAR | Status: DC
  Administered 2023-09-04: 100 mg via INTRAVENOUS
  Filled 2023-09-04: qty 2

## 2023-09-04 NOTE — Progress Notes (Signed)
 Pt has been accepted to [facility] . Bed assignment:#6   Pt meets inpatient criteria per Arvell Latin, NP  Attending Physician will be Dr Deborah Falling   Report can be called to: [phone]   Pt can arrive pending labs   Care Team Notified: [facility] Shelbie Dess, RN, Vola Grow, RN

## 2023-09-04 NOTE — ED Provider Notes (Signed)
  EMERGENCY DEPARTMENT AT [facility] Provider Note   CSN: [id number] Arrival date & time: 09/04/23  1456       Jerry [name] is a 48 y.o. male.   HPI   Patient has a history of anxiety depression PTSD bipolar disorder schizophrenia alcohol dependence drug-induced seizures.  Patient states he drinks alcohol heavily.  He stopped drinking last evening.  He is trying to quit.  Patient states today he became very shaky.  He cannot stop shaking.  He is feeling nauseated.  He denies any seizures.  No fevers.  No abdominal pain.  No chest pain.  No thoughts of harming himself or anyone else.  Pt states he has been hearing some voices again recently.  Prior to Admission medications   Medication Sig Start Date End Date Taking? Authorizing Provider  haloperidol  (HALDOL ) 5 MG tablet Take 1 tablet (5 mg total) by mouth 2 (two) times daily. Patient not taking: Reported on 08/06/2023 07/19/23   Ntuen, Tina C, FNP  hydrOXYzine  (ATARAX ) 25 MG tablet Take 1 tablet (25 mg total) by mouth 3 (three) times daily as needed for anxiety. Patient not taking: Reported on 08/06/2023 07/19/23   Ntuen, Tina C, FNP  thiamine  (VITAMIN B-1) 100 MG tablet Take 1 tablet (100 mg total) by mouth daily. Patient not taking: Reported on 08/06/2023 07/20/23   Ntuen, Tina C, FNP  traZODone  (DESYREL ) 50 MG tablet Take 1 tablet (50 mg total) by mouth at bedtime as needed for sleep. Patient not taking: Reported on 08/06/2023 07/19/23   Ntuen, Tina C, FNP    Allergies: Penicillins    Review of Systems  Updated Vital Signs BP (!) 142/86   Pulse 98   Temp 98.2 F (36.8 C) (Oral)   Resp (!) 29   SpO2 95%   Physical Exam Vitals and nursing note reviewed.  Constitutional:      Appearance: He is well-developed. He is not diaphoretic.  HENT:     Head: Normocephalic and atraumatic.     Right Ear: External ear normal.     Left Ear: External ear normal.   Eyes:     General: No scleral icterus.       Right eye:  No discharge.        Left eye: No discharge.     Conjunctiva/sclera: Conjunctivae normal.   Neck:     Trachea: No tracheal deviation.   Cardiovascular:     Rate and Rhythm: Normal rate and regular rhythm.  Pulmonary:     Effort: Pulmonary effort is normal. No respiratory distress.     Breath sounds: Normal breath sounds. No stridor. No wheezing or rales.  Abdominal:     General: Bowel sounds are normal. There is no distension.     Palpations: Abdomen is soft.     Tenderness: There is no abdominal tenderness. There is no guarding or rebound.   Musculoskeletal:        General: No tenderness or deformity.     Cervical back: Neck supple.   Skin:    General: Skin is warm and dry.     Findings: No rash.   Neurological:     General: No focal deficit present.     Mental Status: He is alert.     Cranial Nerves: No cranial nerve deficit, dysarthria or facial asymmetry.     Sensory: No sensory deficit.     Motor: Tremor present. No abnormal muscle tone or seizure activity.  Coordination: Coordination normal.   Psychiatric:        Mood and Affect: Mood normal.        Thought Content: Thought content does not include homicidal or suicidal ideation.     (all labs ordered are listed, but only abnormal results are displayed) Labs Reviewed  COMPREHENSIVE METABOLIC PANEL WITH GFR - Abnormal; Notable for the following components:      Result Value   Sodium 128 (*)    Potassium 3.0 (*)    Chloride 97 (*)    CO2 21 (*)    Glucose, Bld 132 (*)    Calcium 8.1 (*)    Albumin 3.2 (*)    AST 74 (*)    ALT 48 (*)    All other components within normal limits  RAPID URINE DRUG SCREEN, HOSP PERFORMED - Abnormal; Notable for the following components:   Cocaine POSITIVE (*)    All other components within normal limits  CBC WITH DIFFERENTIAL/PLATELET - Abnormal; Notable for the following components:   RBC 4.00 (*)    HCT 38.8 (*)    Neutro Abs 8.6 (*)    Monocytes Absolute 1.1 (*)     All other components within normal limits  ETHANOL    EKG: None  Radiology: No results found.   Procedures   Medications Ordered in the ED  LORazepam  (ATIVAN ) injection 0-4 mg (has no administration in time range)    Or  LORazepam  (ATIVAN ) tablet 0-4 mg (has no administration in time range)  LORazepam  (ATIVAN ) injection 0-4 mg (has no administration in time range)    Or  LORazepam  (ATIVAN ) tablet 0-4 mg (has no administration in time range)  thiamine  (VITAMIN B1) tablet 100 mg (has no administration in time range)    Or  thiamine  (VITAMIN B1) injection 100 mg (has no administration in time range)  LORazepam  (ATIVAN ) injection 2 mg (2 mg Intravenous Given 09/04/23 1632)  lactated ringers  bolus 1,000 mL (1,000 mLs Intravenous New Bag/Given 09/04/23 1634)  potassium chloride  SA (KLOR-CON  M) CR tablet 40 mEq (40 mEq Oral Given 09/04/23 1800)    Clinical Course as of 09/04/23 1856  Tue Sep 04, 2023  1620 CBC with Diff(!) nl [JK]  1713 Ethanol Alcohol level negative [JK]  1713 Comprehensive metabolic panel(!) Hyponatremia similar to previous hypokalemia noted [JK]  1721 Patient sleeping.  No tachycardia noted.  No tremor noted [JK]  1854 UDS positive for cocaine [JK]    Clinical Course User Index [JK] Trish Furl, MD                                 Medical Decision Making Amount and/or Complexity of Data Reviewed Labs: ordered. Decision-making details documented in ED Course.  Risk Prescription drug management.   Patient presented with complaints of alcohol withdrawal.  Patient notably tremulous on arrival.  Questionable hallucinations recently.  ED workup notable for for positive cocaine which could be contributing to his symptoms.  Patient noted to be hypokalemic and he was given oral potassium.  He was also treated with IV fluids.  Tremulousness has improved.  No signs of tachycardia.  Will continue to monitor closely but at this time he appears medically clear.  No signs  of severe alcohol withdrawal requiring hospitalization.  Will consult psychiatry for possible inpatient treatment regarding his hallucinations and substance use disorder     Final diagnoses:  Alcohol withdrawal syndrome with perceptual disturbance (HCC)  Cocaine use  Homeless    ED Discharge Orders     None          Trish Furl, MD 09/05/23 1002

## 2023-09-04 NOTE — ED Triage Notes (Signed)
 Pt BIB EMS due to alcohol withdrawal and psychosis. Pt c/o abdominal pain. Pt appears diaphoretic on arrival. Pt reports last drink was last night and last drug use, cocaine, was last night. Pt reports hearing voices that are telling thew pt that they are going to harm him. Hx of Paranoid schizophrenia. Hx of seizures with withdrawals. Pt is unhoused. AAOx4  BP 140/84 RR 18 SpO2 95% CBG 164

## 2023-09-04 NOTE — Consult Note (Signed)
 [facility] Psychiatric Consult Initial  Patient Name: .Jerry [name]  MRN: [id number]  DOB: May 24, 1975  Consult Order details:  Orders (From admission, onward)     Start     Ordered   09/04/23 1854  CONSULT TO CALL ACT TEAM       Ordering Provider: Trish Furl, MD  Provider:  (Not yet assigned)  Question:  Reason for Consult?  Answer:  Psych consult   09/04/23 1854             Mode of Visit: In person    Psychiatry Consult Evaluation  Service Date: September 04, 2023 LOS:  LOS: 0 days  Chief Complaint Psychosis, AH, Alcohol withdrawal  Primary Psychiatric Diagnoses  1.Cocaine induced Mood Disorder 2.  Alcohol use disorder, Dependence 3.Psychosis  Assessment  Jerry [name] is a 48 y.o. male admitted: Presented to the EDfor 09/04/2023  2:56 PM for Psychosis, AH. He carries the psychiatric diagnoses of alcohol abuse and schizophrenia history of DTs history of polysubstance abuse, GAD tobacco abuse, malingering, PTSD, tardive dyskinesia.  and has a past medical history of  Seizure Disorder.   His current presentation of AH, Shakes is most consistent with possible Alcohol withdrawal and Cocaine use. He meets criteria for Monitoring Overnight and reevaluate in am based on symptoms presented.  Current outpatient psychotropic medications include none and historically he has had no medications since discharge from the Psychiatry unit . He was not compliant with medications prior to admission as evidenced by his report. On initial examination, patient was seen shaking with tremors of his hand. Please see plan below for detailed recommendations.   Diagnoses:  Active Hospital problems: Principal Problem:   Substance induced mood disorder (HCC) Active Problems:   Cocaine use disorder, moderate, dependence (HCC)    Plan   ## Psychiatric Medication Recommendations:  Initiate CIWA Protocol with Coverage -Ativan  Start Gabapentin  300 mg po tid for mood/Withdrawal Olanzapine  5 mg po at bed  time for Psychosis ## Medical Decision Making Capacity: Not specifically addressed in this encounter  ## Further Work-up:   -- most recent EKG -na -- Pertinent labwork reviewed earlier this admission includes: CMP, CBC, Alcohol level, UDS   ## Disposition:--  Monitor overnight and re-evaluate in am  ## Behavioral / Environmental: - No specific recommendations at this time.     ## Safety and Observation Level:  - Based on my clinical evaluation, I estimate the patient to be at no risk of self harm in the current setting. - At this time, we recommend  routine. This decision is based on my review of the chart including patient's history and current presentation, interview of the patient, mental status examination, and consideration of suicide risk including evaluating suicidal ideation, plan, intent, suicidal or self-harm behaviors, risk factors, and protective factors. This judgment is based on our ability to directly address suicide risk, implement suicide prevention strategies, and develop a safety plan while the patient is in the clinical setting. Please contact our team if there is a concern that risk level has changed.  CSSR Risk Category:C-SSRS RISK CATEGORY: Error: Q3, 4, or 5 should not be populated when Q2 is No  Suicide Risk Assessment: Patient has following modifiable risk factors for suicide: untreated depression and medication noncompliance, substance and alcohol use which we are addressing by recommending that patient be re-evaluated in am to determine appropriate disposition. Patient has following non-modifiable or demographic risk factors for suicide: male gender, history of suicide attempt, and psychiatric hospitalization Patient  has the following protective factors against suicide: none identified   Thank you for this consult request. Recommendations have been communicated to the primary team.  We will psychiatrically clear this patient at this time.   [place] C Prisca Gearing,  NP-PMHNP-BC       History of Present Illness  Relevant Aspects of Hospital ED Course:  Admitted on 09/04/2023 for Psychosis, AH, Alcohol withdrawal  Patient Report:   Male patient 48 years old brought in by EMS for Alcohol withdrawal symptoms with psychosis.  Patient is tremulous, shaking with beats of sweats on his fore head.  Patient reports he drank Alcohol last night and was unable to quantify his alcohol use last night.  Patient also reported he use Cocaine last night as well.  Patient reports hearing voices telling him to kill himself or they will kill him.  Patient was hospitalized at [facility] for same complaint.  Patient back then planned to go to rehab after treatment.  Today he admitted he went back to the street and stated drinking again.  Patient is homeless and states Alcohol and drugs are everywhere. And reports he had alcohol withdrawal seizures four years ago and denied any seizures since yesterday..  Patient is Disheveled, unkempt and tearfully stated that he want to stop drinking Alcohol and using Cocaine.   We will monitor patient over night and reevaluate in am for appropriate disposition.  He is on CIWA protocol at this time.  He has not taken any Psychotropic since his hospitalization at [facility].   Psych ROS:  Depression: yes Anxiety:  yes Mania (lifetime and current): na Psychosis: (lifetime and current): yes-AH  Collateral information:  Contacted -NA  Review of Systems  Constitutional: Negative.   HENT: Negative.    Eyes: Negative.   Respiratory: Negative.    Cardiovascular: Negative.   Gastrointestinal:  Positive for abdominal pain.  Genitourinary: Negative.   Musculoskeletal: Negative.   Skin: Negative.   Neurological: Negative.   Endo/Heme/Allergies: Negative.   Psychiatric/Behavioral:  Positive for depression, hallucinations and substance abuse.      Psychiatric and Social History  Psychiatric History:  Information collected from patient, staff and chart  review   Prev Dx/Sx: alcohol abuse and schizophrenia history of DTs history of polysubstance abuse, GAD tobacco abuse, malingering, PTSD, tardive dyskinesia Current Psych Provider: Dr. Ethel Henry outpatient hx, currently unknown Home Meds (current):  Previous Med Trials: risperidone   Therapy: hx therapist-Brittney Fulton Job, currently unknown    Prior Psych Hospitalization: About 3 lifetime psychiatric hospitalizations Several lifetime episodes of suicidal ideation/attempts, including jumping in front of traffic at least once.  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 Living Situation: homeless Spiritual Hx: unknown Access to weapons/lethal means: Denies   Substance History Alcohol: alcohol dependence  Type of alcohol -unknown Last Drink- unknown Number of drinks per day -unknown History of alcohol withdrawal seizures - History of DT's -hx of DTs Tobacco: unknown Illicit drugs: marijuana every couple of weeks, cocaine about the same, meth per chart hx Prescription drug abuse: unknown Rehab hx: multiple Exam Findings  Physical Exam:  Vital Signs:  Temp:  [98.2 F (36.8 C)] 98.2 F (36.8 C) (06/17 1519) Pulse Rate:  [84-98] 98 (06/17 1800) Resp:  [26-29] 29 (06/17 1800) BP: (133-142)/(80-90) 142/86 (06/17 1800) SpO2:  [95 %-99 %] 95 % (06/17 1800) Blood pressure (!) 142/86, pulse 98, temperature 98.2  F (36.8 C), temperature source Oral, resp. rate (!) 29, SpO2 95%. There is no height or weight on file to calculate BMI.  Physical Exam Vitals and nursing note reviewed.  Constitutional:      Appearance: He is ill-appearing.  HENT:     Nose: Nose normal.   Cardiovascular:     Rate and Rhythm: Normal rate and regular rhythm.  Pulmonary:     Effort: Pulmonary effort is normal.   Musculoskeletal:         General: Normal range of motion.   Skin:    General: Skin is dry.   Neurological:     Mental Status: He is alert and oriented to person, place, and time.   Psychiatric:        Attention and Perception: Attention normal.        Mood and Affect: Mood is anxious and depressed. Affect is tearful.        Speech: Speech is slurred.        Behavior: Behavior normal. Behavior is cooperative.        Thought Content: Thought content normal.        Judgment: Judgment is impulsive.     Mental Status Exam: General Appearance: Casual and Disheveled  Orientation:  Full (Time, Place, and Person)  Memory:  Immediate;   Good Recent;   Good Remote;   Good  Concentration:  Concentration: Good and Attention Span: Good  Recall:  Good  Attention  Good  Eye Contact:  Minimal  Speech:  Slurred  Language:  Fair  Volume:  Normal  Mood: Depressed, sad, I cannot stop drinking Alcohol  Affect:  Congruent  Thought Process:  Coherent  Thought Content:  Logical  Suicidal Thoughts:  No  Homicidal Thoughts:  No  Judgement:  Fair  Insight:  Present  Psychomotor Activity:  Tremor  Akathisia:  NA  Fund of Knowledge:  Fair      Assets:  Manufacturing systems engineer Desire for Improvement Social Support  Cognition:  WNL  ADL's:  Impaired  AIMS (if indicated):        Other History   These have been pulled in through the EMR, reviewed, and updated if appropriate.  Family History:  The patient's family history includes Cancer in his mother; Diabetes in his father and maternal grandmother; Heart disease in his father and mother; Lung cancer in his mother; Stroke in his paternal grandfather.  Medical History: Past Medical History:  Diagnosis Date   Anxiety    Bipolar 1 disorder (HCC)    Depression    Drug-induced seizure (HCC)    EtOH dependence (HCC)    Hallucination    Paranoid schizophrenia (HCC)    since pt's 20's   PTSD (post-traumatic stress disorder)     Surgical History: Past Surgical  History:  Procedure Laterality Date   KIDNEY DONATION Right 2003     Medications:   Current Facility-Administered Medications:    LORazepam  (ATIVAN ) injection 0-4 mg, 0-4 mg, Intravenous, Q6H **OR** LORazepam  (ATIVAN ) tablet 0-4 mg, 0-4 mg, Oral, Q6H, Knapp, Jon, MD   Cecily Cohen ON 09/07/2023] LORazepam  (ATIVAN ) injection 0-4 mg, 0-4 mg, Intravenous, Q12H **OR** [START ON 09/07/2023] LORazepam  (ATIVAN ) tablet 0-4 mg, 0-4 mg, Oral, Q12H, Trish Furl, MD   thiamine  (VITAMIN B1) tablet 100 mg, 100 mg, Oral, Daily **OR** thiamine  (VITAMIN B1) injection 100 mg, 100 mg, Intravenous, Daily, Trish Furl, MD  Current Outpatient Medications:    haloperidol  (HALDOL ) 5 MG tablet, Take 1 tablet (5 mg total)  by mouth 2 (two) times daily. (Patient not taking: Reported on 08/06/2023), Disp: 60 tablet, Rfl: 0   hydrOXYzine  (ATARAX ) 25 MG tablet, Take 1 tablet (25 mg total) by mouth 3 (three) times daily as needed for anxiety. (Patient not taking: Reported on 08/06/2023), Disp: 30 tablet, Rfl: 0   thiamine  (VITAMIN B-1) 100 MG tablet, Take 1 tablet (100 mg total) by mouth daily. (Patient not taking: Reported on 08/06/2023), Disp: 30 tablet, Rfl: 0   traZODone  (DESYREL ) 50 MG tablet, Take 1 tablet (50 mg total) by mouth at bedtime as needed for sleep. (Patient not taking: Reported on 08/06/2023), Disp: 30 tablet, Rfl: 0  Allergies: Allergies  Allergen Reactions   Penicillins Other (See Comments) and Swelling    Reaction occurred in childhood    Alfreida Inches, NP-PMHNP-BC

## 2023-09-05 ENCOUNTER — Emergency Department (HOSPITAL_COMMUNITY)
Admission: EM | Admit: 2023-09-05 | Discharge: 2023-09-05 | Disposition: A | Discharge status: Other Acute Inpatient Hospital | Payer: MEDICAID | Source: Home / Self Care | Attending: Emergency Medicine | Admitting: Emergency Medicine

## 2023-09-05 ENCOUNTER — Ambulatory Visit (HOSPITAL_COMMUNITY)
Admission: EM | Admit: 2023-09-05 | Discharge: 2023-09-05 | Disposition: A | Discharge status: Home / Self Care | Payer: MEDICAID | Source: Home / Self Care

## 2023-09-05 ENCOUNTER — Encounter (HOSPITAL_COMMUNITY): Payer: Self-pay

## 2023-09-05 ENCOUNTER — Other Ambulatory Visit (HOSPITAL_COMMUNITY)
Admission: EM | Admit: 2023-09-05 | Discharge: 2023-09-05 | Disposition: A | Discharge status: Other Acute Inpatient Hospital | Payer: MEDICAID | Source: Home / Self Care

## 2023-09-05 DIAGNOSIS — Z5900 Homelessness unspecified: Secondary | ICD-10-CM | POA: Insufficient documentation

## 2023-09-05 DIAGNOSIS — Z91128 Patient's intentional underdosing of medication regimen for other reason: Secondary | ICD-10-CM | POA: Insufficient documentation

## 2023-09-05 DIAGNOSIS — E871 Hypo-osmolality and hyponatremia: Secondary | ICD-10-CM | POA: Insufficient documentation

## 2023-09-05 DIAGNOSIS — F209 Schizophrenia, unspecified: Secondary | ICD-10-CM | POA: Insufficient documentation

## 2023-09-05 DIAGNOSIS — F29 Unspecified psychosis not due to a substance or known physiological condition: Secondary | ICD-10-CM

## 2023-09-05 DIAGNOSIS — F17210 Nicotine dependence, cigarettes, uncomplicated: Secondary | ICD-10-CM | POA: Insufficient documentation

## 2023-09-05 DIAGNOSIS — F1090 Alcohol use, unspecified, uncomplicated: Secondary | ICD-10-CM

## 2023-09-05 DIAGNOSIS — Z91148 Patient's other noncompliance with medication regimen for other reason: Secondary | ICD-10-CM | POA: Insufficient documentation

## 2023-09-05 DIAGNOSIS — F1010 Alcohol abuse, uncomplicated: Secondary | ICD-10-CM | POA: Insufficient documentation

## 2023-09-05 DIAGNOSIS — E876 Hypokalemia: Secondary | ICD-10-CM | POA: Insufficient documentation

## 2023-09-05 DIAGNOSIS — G40909 Epilepsy, unspecified, not intractable, without status epilepticus: Secondary | ICD-10-CM | POA: Insufficient documentation

## 2023-09-05 DIAGNOSIS — F1490 Cocaine use, unspecified, uncomplicated: Secondary | ICD-10-CM | POA: Insufficient documentation

## 2023-09-05 DIAGNOSIS — F1910 Other psychoactive substance abuse, uncomplicated: Secondary | ICD-10-CM | POA: Insufficient documentation

## 2023-09-05 DIAGNOSIS — F4310 Post-traumatic stress disorder, unspecified: Secondary | ICD-10-CM | POA: Insufficient documentation

## 2023-09-05 DIAGNOSIS — L02511 Cutaneous abscess of right hand: Secondary | ICD-10-CM | POA: Insufficient documentation

## 2023-09-05 DIAGNOSIS — F1414 Cocaine abuse with cocaine-induced mood disorder: Secondary | ICD-10-CM | POA: Insufficient documentation

## 2023-09-05 DIAGNOSIS — F411 Generalized anxiety disorder: Secondary | ICD-10-CM | POA: Insufficient documentation

## 2023-09-05 DIAGNOSIS — T50996A Underdosing of other drugs, medicaments and biological substances, initial encounter: Secondary | ICD-10-CM | POA: Insufficient documentation

## 2023-09-05 DIAGNOSIS — F1020 Alcohol dependence, uncomplicated: Secondary | ICD-10-CM | POA: Insufficient documentation

## 2023-09-05 DIAGNOSIS — F332 Major depressive disorder, recurrent severe without psychotic features: Secondary | ICD-10-CM | POA: Insufficient documentation

## 2023-09-05 DIAGNOSIS — F1990 Other psychoactive substance use, unspecified, uncomplicated: Secondary | ICD-10-CM

## 2023-09-05 DIAGNOSIS — G2401 Drug induced subacute dyskinesia: Secondary | ICD-10-CM | POA: Insufficient documentation

## 2023-09-05 LAB — CBC
HCT: 41.2 % (ref 39.0–52.0)
Hemoglobin: 13.9 g/dL (ref 13.0–17.0)
MCH: 32.8 pg (ref 26.0–34.0)
MCHC: 33.7 g/dL (ref 30.0–36.0)
MCV: 97.2 fL (ref 80.0–100.0)
Platelets: 305 10*3/uL (ref 150–400)
RBC: 4.24 MIL/uL (ref 4.22–5.81)
RDW: 13.3 % (ref 11.5–15.5)
WBC: 9.6 10*3/uL (ref 4.0–10.5)
nRBC: 0 % (ref 0.0–0.2)

## 2023-09-05 LAB — COMPREHENSIVE METABOLIC PANEL WITH GFR
ALT: 51 U/L — ABNORMAL HIGH (ref 0–44)
AST: 79 U/L — ABNORMAL HIGH (ref 15–41)
Albumin: 3.2 g/dL — ABNORMAL LOW (ref 3.5–5.0)
Alkaline Phosphatase: 68 U/L (ref 38–126)
Anion gap: 10 (ref 5–15)
BUN: 7 mg/dL (ref 6–20)
CO2: 24 mmol/L (ref 22–32)
Calcium: 9 mg/dL (ref 8.9–10.3)
Chloride: 100 mmol/L (ref 98–111)
Creatinine, Ser: 0.77 mg/dL (ref 0.61–1.24)
GFR, Estimated: >60 mL/min (ref >60)
Glucose, Bld: 117 mg/dL — ABNORMAL HIGH (ref 70–99)
Potassium: 3.2 mmol/L — ABNORMAL LOW (ref 3.5–5.1)
Sodium: 134 mmol/L — ABNORMAL LOW (ref 135–145)
Total Bilirubin: 1.1 mg/dL (ref 0.0–1.2)
Total Protein: 8.2 g/dL — ABNORMAL HIGH (ref 6.5–8.1)

## 2023-09-05 LAB — POTASSIUM: Potassium: 3.6 mmol/L (ref 3.5–5.1)

## 2023-09-05 MED ORDER — NAPROXEN 500 MG PO TABS: 500.0000 mg | ORAL_TABLET | Freq: Two times a day (BID) | ORAL | Status: DC | PRN

## 2023-09-05 MED ORDER — DIPHENHYDRAMINE HCL 50 MG/ML IJ SOLN: 50.0000 mg | Freq: Three times a day (TID) | INTRAMUSCULAR | Status: DC | PRN

## 2023-09-05 MED ORDER — ALUM & MAG HYDROXIDE-SIMETH 200-200-20 MG/5ML PO SUSP: 30.0000 mL | ORAL | Status: DC | PRN

## 2023-09-05 MED ORDER — FOLIC ACID 1 MG PO TABS
1.0000 mg | ORAL_TABLET | Freq: Once | ORAL | Status: AC
  Administered 2023-09-05: 1 mg via ORAL
  Filled 2023-09-05: qty 1

## 2023-09-05 MED ORDER — HALOPERIDOL LACTATE 5 MG/ML IJ SOLN: 10.0000 mg | Freq: Three times a day (TID) | INTRAMUSCULAR | Status: DC | PRN

## 2023-09-05 MED ORDER — CLONIDINE HCL 0.1 MG PO TABS: 0.1000 mg | ORAL_TABLET | Freq: Every day | ORAL | Status: DC

## 2023-09-05 MED ORDER — HYDROXYZINE HCL 25 MG PO TABS: 25.0000 mg | ORAL_TABLET | Freq: Four times a day (QID) | ORAL | Status: DC | PRN

## 2023-09-05 MED ORDER — ADULT MULTIVITAMIN W/MINERALS CH: 1.0000 | ORAL_TABLET | Freq: Every day | ORAL | Status: DC

## 2023-09-05 MED ORDER — THIAMINE HCL 100 MG PO TABS
100.0000 mg | ORAL_TABLET | Freq: Once | ORAL | Status: AC
  Administered 2023-09-05: 100 mg via ORAL
  Filled 2023-09-05: qty 1

## 2023-09-05 MED ORDER — DICYCLOMINE HCL 20 MG PO TABS: 20.0000 mg | ORAL_TABLET | Freq: Four times a day (QID) | ORAL | Status: DC | PRN

## 2023-09-05 MED ORDER — CEPHALEXIN 500 MG PO CAPS: 500.0000 mg | ORAL_CAPSULE | Freq: Four times a day (QID) | ORAL | Status: DC

## 2023-09-05 MED ORDER — LORAZEPAM 2 MG/ML IJ SOLN: 2.0000 mg | Freq: Three times a day (TID) | INTRAMUSCULAR | Status: DC | PRN

## 2023-09-05 MED ORDER — MAGNESIUM HYDROXIDE 400 MG/5ML PO SUSP: 30.0000 mL | Freq: Every day | ORAL | Status: DC | PRN

## 2023-09-05 MED ORDER — HALOPERIDOL LACTATE 5 MG/ML IJ SOLN: 5.0000 mg | Freq: Three times a day (TID) | INTRAMUSCULAR | Status: DC | PRN

## 2023-09-05 MED ORDER — THIAMINE MONONITRATE 100 MG PO TABS: 100.0000 mg | ORAL_TABLET | Freq: Every day | ORAL | Status: DC

## 2023-09-05 MED ORDER — CLONIDINE HCL 0.1 MG PO TABS: 0.1000 mg | ORAL_TABLET | ORAL | Status: DC

## 2023-09-05 MED ORDER — HALOPERIDOL 5 MG PO TABS: 5.0000 mg | ORAL_TABLET | Freq: Three times a day (TID) | ORAL | Status: DC | PRN

## 2023-09-05 MED ORDER — DOXYCYCLINE HYCLATE 100 MG PO TABS: 100.0000 mg | ORAL_TABLET | Freq: Two times a day (BID) | ORAL | Status: DC

## 2023-09-05 MED ORDER — THIAMINE HCL 100 MG/ML IJ SOLN: 100.0000 mg | Freq: Every day | INTRAMUSCULAR | Status: DC

## 2023-09-05 MED ORDER — METHOCARBAMOL 500 MG PO TABS: 500.0000 mg | ORAL_TABLET | Freq: Three times a day (TID) | ORAL | Status: DC | PRN

## 2023-09-05 MED ORDER — LOPERAMIDE HCL 2 MG PO CAPS: 2.0000 mg | ORAL_CAPSULE | ORAL | Status: DC | PRN

## 2023-09-05 MED ORDER — POTASSIUM CHLORIDE CRYS ER 20 MEQ PO TBCR
40.0000 meq | EXTENDED_RELEASE_TABLET | Freq: Once | ORAL | Status: AC
  Administered 2023-09-05: 40 meq via ORAL
  Filled 2023-09-05: qty 2

## 2023-09-05 MED ORDER — DIPHENHYDRAMINE HCL 50 MG PO CAPS: 50.0000 mg | ORAL_CAPSULE | Freq: Three times a day (TID) | ORAL | Status: DC | PRN

## 2023-09-05 MED ORDER — DOXYCYCLINE HYCLATE 100 MG PO TABS
100.0000 mg | ORAL_TABLET | Freq: Once | ORAL | Status: AC
  Administered 2023-09-05: 100 mg via ORAL
  Filled 2023-09-05: qty 1

## 2023-09-05 MED ORDER — CHLORDIAZEPOXIDE HCL 25 MG PO CAPS: 25.0000 mg | ORAL_CAPSULE | Freq: Four times a day (QID) | ORAL | Status: DC | PRN

## 2023-09-05 MED ORDER — CLONIDINE HCL 0.1 MG PO TABS: 0.1000 mg | ORAL_TABLET | Freq: Four times a day (QID) | ORAL | Status: DC

## 2023-09-05 MED ORDER — ONDANSETRON 4 MG PO TBDP: 4.0000 mg | ORAL_TABLET | Freq: Four times a day (QID) | ORAL | Status: DC | PRN

## 2023-09-05 MED ORDER — SODIUM CHLORIDE 0.9 % IV BOLUS: 1000.0000 mL | Freq: Once | INTRAVENOUS | Status: DC

## 2023-09-05 MED ORDER — ACETAMINOPHEN 325 MG PO TABS: 650.0000 mg | ORAL_TABLET | Freq: Four times a day (QID) | ORAL | Status: DC | PRN

## 2023-09-05 NOTE — ED Triage Notes (Signed)
 Pt sent from [facility] for lab redraw of NA level.

## 2023-09-05 NOTE — Progress Notes (Signed)
   09/05/23 0953  [facility] Triage Screening (Walk-ins at [facility] only)  How Did You Hear About Us ? Legal System  What Is the Reason for Your Visit/Call Today? Pt came from [facility] and is being tranferred to Observation for alcohol use treatment.  How Long Has This Been Causing You Problems? <Week  Have You Recently Had Any Thoughts About Hurting Yourself? No  Are You Planning to Commit Suicide/Harm Yourself At This time? No  Have you Recently Had Thoughts About Hurting Someone Jerry [name]? No  Are You Planning To Harm Someone At This Time? No  Physical Abuse Denies  Verbal Abuse Denies  Sexual Abuse Denies  Exploitation of patient/patient's resources Denies  Self-Neglect Denies  Possible abuse reported to: Other (Comment)  Are you currently experiencing any auditory, visual or other hallucinations? No  Have You Used Any Alcohol or Drugs in the Past 24 Hours? Yes  What Did You Use and How Much? unsure of amount  Do you have any current medical co-morbidities that require immediate attention? No  Clinician description of patient physical appearance/behavior: calm, cooperative  What Do You Feel Would Help You the Most Today? Alcohol or Drug Use Treatment  If access to [facility] was not available, would you have sought care in the Emergency Department? No  Determination of Need Urgent (48 hours)  Options For Referral Facility-Based Crisis  Determination of Need filed? Yes

## 2023-09-05 NOTE — Discharge Instructions (Signed)
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
## 2023-09-05 NOTE — Discharge Instructions (Signed)
   Discharge recommendations:  Patient is to take medications as prescribed. Please see information for follow-up appointment with psychiatry and therapy. Please follow up with your primary care provider for all medical related needs.   Therapy: We recommend that patient participate in individual therapy to address mental health concerns.  Safety:  The patient should abstain from use of illicit substances/drugs and abuse of any medications. If symptoms worsen or do not continue to improve or if the patient becomes actively suicidal or homicidal then it is recommended that the patient return to the closest hospital emergency department, the [facility], or call 911 for further evaluation and treatment. National Suicide Prevention Lifeline 1-800-SUICIDE or 343-540-1664.  About 988 988 offers 24/7 access to trained crisis counselors who can help people experiencing mental health-related distress. People can call or text 988 or chat 988lifeline.org for themselves or if they are worried about a loved one who may need crisis support.  Crisis Mobile: [facility]:                     541-402-3479 (for crisis response 24 hours a day) [facility] Hotline:                                            (929) 468-9115

## 2023-09-05 NOTE — ED Notes (Signed)
 Patient discharged in no acute distress.Belongings returned from locker #1. Bus pass provided. Pt refused his AVS.

## 2023-09-05 NOTE — ED Notes (Signed)
 [facility] contacted for transportation back to [facility]

## 2023-09-05 NOTE — ED Provider Notes (Signed)
 [facility] Continuous Assessment Admission H&P  Date: 09/05/23 Patient Name: Jerry [name] MRN: [id number] Chief Complaint: Patient unable to speak, voice hoarse.  Diagnoses:  Final diagnoses:  Polysubstance abuse (HCC)  Psychosis, unspecified psychosis type (HCC)  Homelessness  Cocaine abuse with cocaine-induced mood disorder (HCC)  Alcohol use disorder    HPI: Jerry [name] is a 48 y.o. male. Presented to the ED  09/04/2023  2:56 PM via EMS for Psychosis, AH.with psychiatric diagnoses of alcohol abuse, schizophrenia, history of Dts, polysubstance abuse, GAD tobacco abuse, malingering, PTSD, tardive dyskinesia, and has a past medical history of  Seizure Disorder. He presented to the ED with AH and shakes/tremors on his hand, and diaphoretic.  Patient was transferred to the [facility] as a direct admit from [facility]  for continuous observation overnight and re-eval in the am.   Patient was seen face to face by this provider and chart reviewed.   On approach, patient is seen laying on the bench in the evaluation room. He is awoken by voice. However, He is unable to speak and is inattentive.  Patient was asked what happened to his voice and he shrugs with his shoulders and hands in an expression of I don't know.  When asked again if he is able to speak, he mouthed the words No He was asked when he lost his voice and he mouthed last night.  When asked how he lost his voice/what happened, he shrugged again  and mouthed  I don't know.  When asked if he is seeking detox, he nods yes.  Patient is unable to verbally engage or participate in this evaluation However, he is able to shake his head No when asked about SI/HI/AVH or paranoia.   Per [facility] NP chart review, Patient reports he drank Alcohol last night and was unable to quantify his alcohol use last night. Patient also reported he use Cocaine last night as well. Patient reports hearing voices telling him to kill himself or they will  kill him. Patient was hospitalized at [facility] for same complaint. Patient back then planned to go to rehab after treatment. Today he admitted he went back to the street and stated drinking again. Patient is homeless and states Alcohol and drugs are everywhere. And reports he had alcohol withdrawal seizures four years ago and denied any seizures since yesterday.. Patient is Disheveled, unkempt and tearfully stated that he want to stop drinking Alcohol and using Cocaine. Current outpatient psychotropic medications include none and historically he has had no medications since discharge from the Psychiatry unit . He was not compliant with medications prior to admission as evidenced by his report.    On evaluation, patient is alert, oriented x at baseline, and cooperative. No Speech.  Pt appears dressed in hospital scrubs. Eye contact is poor. Mood is euthymic, affect is congruent with mood. Thought process is goal directed and thought content is WDL. Pt denies SI/HI/AVH. There is no objective indication that the patient is responding to internal stimuli. No delusions elicited during this assessment.    Total Time spent with patient: 20 minutes  Musculoskeletal  Strength & Muscle Tone: decreased Gait & Station: shuffle Patient leans: in wheelchair  Psychiatric Specialty Exam  Presentation General Appearance:  Other (comment) (in hospital scrubs)  Eye Contact: Poor  Speech: Other (comment) (voice hoarse)  Speech Volume: Other (comment) (voice hoarse)  Handedness: Right   Mood and Affect  Mood: Euthymic  Affect: Congruent   Thought Process  Thought Processes: Goal  Directed  Descriptions of Associations:Loose  Orientation:Partial  Thought Content:WDL  Diagnosis of Schizophrenia or Schizoaffective disorder in past: Yes  Duration of Psychotic Symptoms: Greater than six months  Hallucinations:Hallucinations: None  Ideas of Reference:None  Suicidal Thoughts:Suicidal Thoughts:  No  Homicidal Thoughts:Homicidal Thoughts: No   Sensorium  Memory: Immediate Poor  Judgment: Poor  Insight: None   Executive Functions  Concentration: Poor  Attention Span: Poor  Recall: Poor  Fund of Knowledge: Poor  Language: Poor   Psychomotor Activity  Psychomotor Activity: Psychomotor Activity: Normal   Assets  Assets: Communication Skills; Desire for Improvement   Sleep  Sleep: Sleep: Fair   Nutritional Assessment (For OBS and FBC admissions only) Has the patient had a weight loss or gain of 10 pounds or more in the last 3 months?: No Has the patient had a decrease in food intake/or appetite?: No Does the patient have dental problems?: No Does the patient have eating habits or behaviors that may be indicators of an eating disorder including binging or inducing vomiting?: No Has the patient recently lost weight without trying?: 0 Has the patient been eating poorly because of a decreased appetite?: 0 Malnutrition Screening Tool Score: 0    Physical Exam Constitutional:      General: He is not in acute distress. HENT:     Nose: No congestion.   Cardiovascular:     Rate and Rhythm: Normal rate.  Pulmonary:     Effort: No respiratory distress.  Chest:     Chest wall: No tenderness.   Neurological:     Mental Status: Mental status is at baseline.   Psychiatric:        Attention and Perception: He is inattentive.        Mood and Affect: Mood and affect normal.        Speech: He is noncommunicative.        Behavior: Behavior is cooperative.        Thought Content: Thought content normal.   Review of Systems  Constitutional:  Negative for chills, diaphoresis and fever.  HENT:  Negative for congestion.   Eyes:  Negative for discharge.  Respiratory:  Negative for cough, shortness of breath and wheezing.   Cardiovascular:  Negative for chest pain and palpitations.  Gastrointestinal:  Negative for diarrhea, nausea and vomiting.   Neurological:  Negative for dizziness, seizures, weakness and headaches.  Psychiatric/Behavioral:  Positive for substance abuse.     Blood pressure 102/72, pulse 92, temperature 100.1 F (37.8 C), resp. rate 16, SpO2 99%. There is no height or weight on file to calculate BMI.  Past Psychiatric History: See H & P   Is the patient at risk to self? No  Has the patient been a risk to self in the past 6 months? unknown.    Has the patient been a risk to self within the distant past? unknown  Is the patient a risk to others? No   Has the patient been a risk to others in the past 6 months? unknown  Has the patient been a risk to others within the distant past? unknown  Past Medical History: See Chart  Family History: N/A  Social History: N/A  Last Labs:  Admission on 09/04/2023, Discharged on 09/05/2023  Component Date Value Ref Range Status   Sodium 09/04/2023 128 (L)  135 - 145 mmol/L Final   Potassium 09/04/2023 3.0 (L)  3.5 - 5.1 mmol/L Final   Chloride 09/04/2023 97 (L)  98 - 111 mmol/L  Final   CO2 09/04/2023 21 (L)  22 - 32 mmol/L Final   Glucose, Bld 09/04/2023 132 (H)  70 - 99 mg/dL Final   Glucose reference range applies only to samples taken after fasting for at least 8 hours.   BUN 09/04/2023 8  6 - 20 mg/dL Final   Creatinine, Ser 09/04/2023 0.86  0.61 - 1.24 mg/dL Final   Calcium 16/12/9602 8.1 (L)  8.9 - 10.3 mg/dL Final   Total Protein 54/11/8117 7.9  6.5 - 8.1 g/dL Final   Albumin 14/78/2956 3.2 (L)  3.5 - 5.0 g/dL Final   AST 21/30/8657 74 (H)  15 - 41 U/L Final   ALT 09/04/2023 48 (H)  0 - 44 U/L Final   Alkaline Phosphatase 09/04/2023 66  38 - 126 U/L Final   Total Bilirubin 09/04/2023 0.7  0.0 - 1.2 mg/dL Final   GFR, Estimated 09/04/2023 >60  >60 mL/min Final   Comment: (NOTE) Calculated using the CKD-EPI Creatinine Equation (2021)    Anion gap 09/04/2023 10  5 - 15 Final   Performed at [facility], [address], [place], [place]  [address]   Alcohol, Ethyl (B) 09/04/2023 <15  <15 mg/dL Final   Comment: (NOTE) For medical purposes only. Performed at [facility], [address], [place], [place] [address]    Opiates 09/04/2023 NONE DETECTED  NONE DETECTED Final   Cocaine 09/04/2023 POSITIVE (A)  NONE DETECTED Final   Benzodiazepines 09/04/2023 NONE DETECTED  NONE DETECTED Final   Amphetamines 09/04/2023 NONE DETECTED  NONE DETECTED Final   Tetrahydrocannabinol 09/04/2023 NONE DETECTED  NONE DETECTED Final   Barbiturates 09/04/2023 NONE DETECTED  NONE DETECTED Final   Comment: (NOTE) DRUG SCREEN FOR MEDICAL PURPOSES ONLY.  IF CONFIRMATION IS NEEDED FOR ANY PURPOSE, NOTIFY LAB WITHIN 5 DAYS.  LOWEST DETECTABLE LIMITS FOR URINE DRUG SCREEN Drug Class                     Cutoff (ng/mL) Amphetamine and metabolites    1000 Barbiturate and metabolites    200 Benzodiazepine                 200 Opiates and metabolites        300 Cocaine and metabolites        300 THC                            50 Performed at [facility], [address] [place], [place] [address]    WBC 09/04/2023 10.4  4.0 - 10.5 K/uL Final   RBC 09/04/2023 4.00 (L)  4.22 - 5.81 MIL/uL Final   Hemoglobin 09/04/2023 13.2  13.0 - 17.0 g/dL Final   HCT 40/12/2723 38.8 (L)  39.0 - 52.0 % Final   MCV 09/04/2023 97.0  80.0 - 100.0 fL Final   MCH 09/04/2023 33.0  26.0 - 34.0 pg Final   MCHC 09/04/2023 34.0  30.0 - 36.0 g/dL Final   RDW 36/64/4034 13.2  11.5 - 15.5 % Final   Platelets 09/04/2023 276  150 - 400 K/uL Final   nRBC 09/04/2023 0.0  0.0 - 0.2 % Final   Neutrophils Relative % 09/04/2023 83  % Final   Neutro Abs 09/04/2023 8.6 (H)  1.7 - 7.7 K/uL Final   Lymphocytes Relative 09/04/2023 7  % Final   Lymphs Abs 09/04/2023 0.7  0.7 - 4.0 K/uL Final  Monocytes Relative 09/04/2023 10  % Final   Monocytes Absolute 09/04/2023 1.1 (H)  0.1 - 1.0 K/uL Final   Eosinophils Relative 09/04/2023 0  % Final    Eosinophils Absolute 09/04/2023 0.0  0.0 - 0.5 K/uL Final   Basophils Relative 09/04/2023 0  % Final   Basophils Absolute 09/04/2023 0.0  0.0 - 0.1 K/uL Final   Immature Granulocytes 09/04/2023 0  % Final   Abs Immature Granulocytes 09/04/2023 0.03  0.00 - 0.07 K/uL Final   Performed at [facility], [address] [place], [place] [address]   Potassium 09/05/2023 3.6  3.5 - 5.1 mmol/L Final   Performed at [facility], [address] [place], [place] [address]  Admission on 08/04/2023, Discharged on 08/07/2023  Component Date Value Ref Range Status   Sodium 08/04/2023 132 (L)  135 - 145 mmol/L Final   Potassium 08/04/2023 4.0  3.5 - 5.1 mmol/L Final   Chloride 08/04/2023 99  98 - 111 mmol/L Final   CO2 08/04/2023 25  22 - 32 mmol/L Final   Glucose, Bld 08/04/2023 100 (H)  70 - 99 mg/dL Final   Glucose reference range applies only to samples taken after fasting for at least 8 hours.   BUN 08/04/2023 10  6 - 20 mg/dL Final   Creatinine, Ser 08/04/2023 0.98  0.61 - 1.24 mg/dL Final   Calcium 82/95/6213 9.6  8.9 - 10.3 mg/dL Final   Total Protein 08/65/7846 9.1 (H)  6.5 - 8.1 g/dL Final   Albumin 96/29/5284 4.1  3.5 - 5.0 g/dL Final   AST 13/24/4010 32  15 - 41 U/L Final   ALT 08/04/2023 23  0 - 44 U/L Final   Alkaline Phosphatase 08/04/2023 59  38 - 126 U/L Final   Total Bilirubin 08/04/2023 1.1  0.0 - 1.2 mg/dL Final   GFR, Estimated 08/04/2023 >60  >60 mL/min Final   Comment: (NOTE) Calculated using the CKD-EPI Creatinine Equation (2021)    Anion gap 08/04/2023 8  5 - 15 Final   Performed at [facility], [address] [place], [place] [address]   Alcohol, Ethyl (B) 08/04/2023 <15  <15 mg/dL Final   Comment: Please note change in reference range. (NOTE) For medical purposes only. Performed at [facility], [address] [place], [place] [address]    Opiates 08/04/2023 NONE DETECTED  NONE DETECTED  Final   Cocaine 08/04/2023 POSITIVE (A)  NONE DETECTED Final   Benzodiazepines 08/04/2023 POSITIVE (A)  NONE DETECTED Final   Amphetamines 08/04/2023 POSITIVE (A)  NONE DETECTED Final   Comment: (NOTE) Trazodone  is metabolized in vivo to several metabolites, including pharmacologically active m-CPP, which is excreted in the urine. Immunoassay screens for amphetamines and MDMA have potential cross-reactivity with these compounds and may provide false positive  results.     Tetrahydrocannabinol 08/04/2023 NONE DETECTED  NONE DETECTED Final   Barbiturates 08/04/2023 NONE DETECTED  NONE DETECTED Final   Comment: (NOTE) DRUG SCREEN FOR MEDICAL PURPOSES ONLY.  IF CONFIRMATION IS NEEDED FOR ANY PURPOSE, NOTIFY LAB WITHIN 5 DAYS.  LOWEST DETECTABLE LIMITS FOR URINE DRUG SCREEN Drug Class                     Cutoff (ng/mL) Amphetamine and metabolites    1000 Barbiturate and metabolites    200 Benzodiazepine                 200 Opiates and metabolites  300 Cocaine and metabolites        300 THC                            50 Performed at [facility], [address] [place], [place] [address]    WBC 08/04/2023 7.4  4.0 - 10.5 K/uL Final   RBC 08/04/2023 4.07 (L)  4.22 - 5.81 MIL/uL Final   Hemoglobin 08/04/2023 13.5  13.0 - 17.0 g/dL Final   HCT 60/45/4098 40.7  39.0 - 52.0 % Final   MCV 08/04/2023 100.0  80.0 - 100.0 fL Final   MCH 08/04/2023 33.2  26.0 - 34.0 pg Final   MCHC 08/04/2023 33.2  30.0 - 36.0 g/dL Final   RDW 11/91/4782 13.3  11.5 - 15.5 % Final   Platelets 08/04/2023 329  150 - 400 K/uL Final   nRBC 08/04/2023 0.0  0.0 - 0.2 % Final   Neutrophils Relative % 08/04/2023 69  % Final   Neutro Abs 08/04/2023 5.1  1.7 - 7.7 K/uL Final   Lymphocytes Relative 08/04/2023 19  % Final   Lymphs Abs 08/04/2023 1.4  0.7 - 4.0 K/uL Final   Monocytes Relative 08/04/2023 10  % Final   Monocytes Absolute 08/04/2023 0.8  0.1 - 1.0 K/uL Final   Eosinophils  Relative 08/04/2023 1  % Final   Eosinophils Absolute 08/04/2023 0.1  0.0 - 0.5 K/uL Final   Basophils Relative 08/04/2023 0  % Final   Basophils Absolute 08/04/2023 0.0  0.0 - 0.1 K/uL Final   Immature Granulocytes 08/04/2023 1  % Final   Abs Immature Granulocytes 08/04/2023 0.04  0.00 - 0.07 K/uL Final   Performed at [facility], [address], [place], [place] [address]  Admission on 07/14/2023, Discharged on 07/19/2023  Component Date Value Ref Range Status   Sodium 07/19/2023 131 (L)  135 - 145 mmol/L Final   Potassium 07/19/2023 4.3  3.5 - 5.1 mmol/L Final   Chloride 07/19/2023 98  98 - 111 mmol/L Final   CO2 07/19/2023 23  22 - 32 mmol/L Final   Glucose, Bld 07/19/2023 104 (H)  70 - 99 mg/dL Final   Glucose reference range applies only to samples taken after fasting for at least 8 hours.   BUN 07/19/2023 16  6 - 20 mg/dL Final   Creatinine, Ser 07/19/2023 0.76  0.61 - 1.24 mg/dL Final   Calcium 30/86/5784 9.2  8.9 - 10.3 mg/dL Final   Total Protein 69/62/9528 8.5 (H)  6.5 - 8.1 g/dL Final   Albumin 41/32/4401 3.5  3.5 - 5.0 g/dL Final   AST 02/72/5366 30  15 - 41 U/L Final   ALT 07/19/2023 34  0 - 44 U/L Final   Alkaline Phosphatase 07/19/2023 51  38 - 126 U/L Final   Total Bilirubin 07/19/2023 0.6  0.0 - 1.2 mg/dL Final   GFR, Estimated 07/19/2023 >60  >60 mL/min Final   Comment: (NOTE) Calculated using the CKD-EPI Creatinine Equation (2021)    Anion gap 07/19/2023 10  5 - 15 Final   Performed at [facility], [address], [place], [place] [address]   Hgb A1c MFr Bld 07/19/2023 4.8  4.8 - 5.6 % Final   Comment: (NOTE) Pre diabetes:          5.7%-6.4%  Diabetes:              >6.4%  Glycemic control  for   <7.0% adults with diabetes    Mean Plasma Glucose 07/19/2023 91.06  mg/dL Final   Performed at [facility], [address], [place], [place] [address]   Cholesterol 07/19/2023 168  0 - 200 mg/dL Final   Triglycerides  07/19/2023 120  <150 mg/dL Final   HDL 98/01/9146 42  >40 mg/dL Final   Total CHOL/HDL Ratio 07/19/2023 4.0  RATIO Final   VLDL 07/19/2023 24  0 - 40 mg/dL Final   LDL Cholesterol 07/19/2023 102 (H)  0 - 99 mg/dL Final   Comment:        Total Cholesterol/HDL:CHD Risk Coronary Heart Disease Risk Table                     Men   Women  1/2 Average Risk   3.4   3.3  Average Risk       5.0   4.4  2 X Average Risk   9.6   7.1  3 X Average Risk  23.4   11.0        Use the calculated Patient Ratio above and the CHD Risk Table to determine the patient's CHD Risk.        ATP III CLASSIFICATION (LDL):  <100     mg/dL   Optimal  829-562  mg/dL   Near or Above                    Optimal  130-159  mg/dL   Borderline  130-865  mg/dL   High  >784     mg/dL   Very High Performed at [facility], [address], [place], [place] [address]    RPR Ser Ql 07/19/2023 NON REACTIVE  NON REACTIVE Final   Performed at [facility], [address] [place], [place] [address]   TSH 07/19/2023 1.931  0.350 - 4.500 uIU/mL Final   Comment: Performed by a 3rd Generation assay with a functional sensitivity of <=0.01 uIU/mL. Performed at [facility], [address], [place], [place] [address]    Vit D, 25-Hydroxy 07/19/2023 32.02  30 - 100 ng/mL Final   Comment: (NOTE) Vitamin D  deficiency has been defined by the Institute of Medicine  and an Endocrine Society practice guideline as a level of serum 25-OH  vitamin D  less than 20 ng/mL (1,2). The Endocrine Society went on to  further define vitamin D  insufficiency as a level between 21 and 29  ng/mL (2).  1. IOM (Institute of Medicine). 2010. Dietary reference intakes for  calcium and D. [place]: [employer]. 2. Holick MF, Binkley Yettem, Bischoff-Ferrari HA, et al. Evaluation,  treatment, and prevention of vitamin D  deficiency: an Endocrine  Society clinical practice guideline, JCEM. 2011 Jul;  96(7): 1911-30.  Performed at [facility], [address], [place], [place] [address]    Vitamin B-12 07/19/2023 269  180 - 914 pg/mL Final   Comment: (NOTE) This assay is not validated for testing neonatal or myeloproliferative syndrome specimens for Vitamin B12 levels. Performed at [facility], [address], [place], [place] [address]    HIV Screen 4th Generation wRfx 07/19/2023 Non Reactive  Non Reactive Final   Performed at [facility], [address], [place], [place] [address]  Admission on 07/13/2023, Discharged on 07/14/2023  Component Date Value Ref Range Status   WBC 07/13/2023 8.4  4.0 - 10.5  K/uL Final   RBC 07/13/2023 3.81 (L)  4.22 - 5.81 MIL/uL Final   Hemoglobin 07/13/2023 12.6 (L)  13.0 - 17.0 g/dL Final   HCT 78/29/5621 37.8 (L)  39.0 - 52.0 % Final   MCV 07/13/2023 99.2  80.0 - 100.0 fL Final   MCH 07/13/2023 33.1  26.0 - 34.0 pg Final   MCHC 07/13/2023 33.3  30.0 - 36.0 g/dL Final   RDW 30/86/5784 13.2  11.5 - 15.5 % Final   Platelets 07/13/2023 418 (H)  150 - 400 K/uL Final   REPEATED TO VERIFY   nRBC 07/13/2023 0.0  0.0 - 0.2 % Final   Neutrophils Relative % 07/13/2023 55  % Final   Neutro Abs 07/13/2023 4.7  1.7 - 7.7 K/uL Final   Lymphocytes Relative 07/13/2023 25  % Final   Lymphs Abs 07/13/2023 2.1  0.7 - 4.0 K/uL Final   Monocytes Relative 07/13/2023 12  % Final   Monocytes Absolute 07/13/2023 1.0  0.1 - 1.0 K/uL Final   Eosinophils Relative 07/13/2023 7  % Final   Eosinophils Absolute 07/13/2023 0.6 (H)  0.0 - 0.5 K/uL Final   Basophils Relative 07/13/2023 1  % Final   Basophils Absolute 07/13/2023 0.1  0.0 - 0.1 K/uL Final   Immature Granulocytes 07/13/2023 0  % Final   Abs Immature Granulocytes 07/13/2023 0.02  0.00 - 0.07 K/uL Final   Performed at [facility], [address], [place], [place] [address]   Sodium 07/13/2023 131 (L)  135 - 145 mmol/L Final   Potassium 07/13/2023 3.9  3.5 - 5.1  mmol/L Final   Chloride 07/13/2023 99  98 - 111 mmol/L Final   CO2 07/13/2023 23  22 - 32 mmol/L Final   Glucose, Bld 07/13/2023 105 (H)  70 - 99 mg/dL Final   Glucose reference range applies only to samples taken after fasting for at least 8 hours.   BUN 07/13/2023 12  6 - 20 mg/dL Final   Creatinine, Ser 07/13/2023 1.06  0.61 - 1.24 mg/dL Final   Calcium 52/84/1324 8.9  8.9 - 10.3 mg/dL Final   Total Protein 40/12/2723 8.1  6.5 - 8.1 g/dL Final   Albumin 36/64/4034 3.3 (L)  3.5 - 5.0 g/dL Final   AST 74/25/9563 41  15 - 41 U/L Final   ALT 07/13/2023 45 (H)  0 - 44 U/L Final   Alkaline Phosphatase 07/13/2023 64  38 - 126 U/L Final   Total Bilirubin 07/13/2023 0.8  0.0 - 1.2 mg/dL Final   GFR, Estimated 07/13/2023 >60  >60 mL/min Final   Comment: (NOTE) Calculated using the CKD-EPI Creatinine Equation (2021)    Anion gap 07/13/2023 9  5 - 15 Final   Performed at [facility], [address] [place], [place] [address]   Alcohol, Ethyl (B) 07/13/2023 <15  <15 mg/dL Final   Comment: Please note change in reference range. (NOTE) For medical purposes only. Performed at [facility], [address], [place], [place] [address]    Opiates 07/13/2023 NONE DETECTED  NONE DETECTED Final   Cocaine 07/13/2023 NONE DETECTED  NONE DETECTED Final   Benzodiazepines 07/13/2023 POSITIVE (A)  NONE DETECTED Final   Amphetamines 07/13/2023 POSITIVE (A)  NONE DETECTED Final   Comment: (NOTE) Trazodone  is metabolized in vivo to several metabolites, including pharmacologically active m-CPP, which is excreted in the urine. Immunoassay screens for amphetamines and MDMA have potential cross-reactivity with these compounds and may provide false  positive  results.     Tetrahydrocannabinol 07/13/2023 POSITIVE (A)  NONE DETECTED Final   Barbiturates 07/13/2023 POSITIVE (A)  NONE DETECTED Final   Comment: (NOTE) DRUG SCREEN FOR MEDICAL PURPOSES ONLY.  IF CONFIRMATION  IS NEEDED FOR ANY PURPOSE, NOTIFY LAB WITHIN 5 DAYS.  LOWEST DETECTABLE LIMITS FOR URINE DRUG SCREEN Drug Class                     Cutoff (ng/mL) Amphetamine and metabolites    1000 Barbiturate and metabolites    200 Benzodiazepine                 200 Opiates and metabolites        300 Cocaine and metabolites        300 THC                            50 Performed at [facility], [address], [place], [place] [address]   Admission on 07/11/2023, Discharged on 07/12/2023  Component Date Value Ref Range Status   Sodium 07/11/2023 134 (L)  135 - 145 mmol/L Final   Potassium 07/11/2023 3.7  3.5 - 5.1 mmol/L Final   Chloride 07/11/2023 100  98 - 111 mmol/L Final   CO2 07/11/2023 22  22 - 32 mmol/L Final   Glucose, Bld 07/11/2023 106 (H)  70 - 99 mg/dL Final   Glucose reference range applies only to samples taken after fasting for at least 8 hours.   BUN 07/11/2023 10  6 - 20 mg/dL Final   Creatinine, Ser 07/11/2023 0.90  0.61 - 1.24 mg/dL Final   Calcium 60/45/4098 9.2  8.9 - 10.3 mg/dL Final   Total Protein 11/91/4782 8.9 (H)  6.5 - 8.1 g/dL Final   Albumin 95/62/1308 3.5  3.5 - 5.0 g/dL Final   AST 65/78/4696 48 (H)  15 - 41 U/L Final   ALT 07/11/2023 55 (H)  0 - 44 U/L Final   Alkaline Phosphatase 07/11/2023 59  38 - 126 U/L Final   Total Bilirubin 07/11/2023 0.6  0.0 - 1.2 mg/dL Final   GFR, Estimated 07/11/2023 >60  >60 mL/min Final   Comment: (NOTE) Calculated using the CKD-EPI Creatinine Equation (2021)    Anion gap 07/11/2023 12  5 - 15 Final   Performed at [facility], [address] [place], [place] [address]   Alcohol, Ethyl (B) 07/11/2023 <15  <15 mg/dL Final   Comment: Please note change in reference range. (NOTE) For medical purposes only. Performed at [facility], [address] [place], [place] [address]    WBC 07/11/2023 9.3  4.0 - 10.5 K/uL Final   RBC 07/11/2023 3.97 (L)  4.22 - 5.81 MIL/uL Final   Hemoglobin  07/11/2023 13.1  13.0 - 17.0 g/dL Final   HCT 40/12/2723 39.3  39.0 - 52.0 % Final   MCV 07/11/2023 99.0  80.0 - 100.0 fL Final   MCH 07/11/2023 33.0  26.0 - 34.0 pg Final   MCHC 07/11/2023 33.3  30.0 - 36.0 g/dL Final   RDW 36/64/4034 13.2  11.5 - 15.5 % Final   Platelets 07/11/2023 558 (H)  150 - 400 K/uL Final   nRBC 07/11/2023 0.0  0.0 - 0.2 % Final   Neutrophils Relative % 07/11/2023 52  % Final   Neutro Abs 07/11/2023 4.8  1.7 - 7.7 K/uL Final   Lymphocytes Relative 07/11/2023  29  % Final   Lymphs Abs 07/11/2023 2.7  0.7 - 4.0 K/uL Final   Monocytes Relative 07/11/2023 13  % Final   Monocytes Absolute 07/11/2023 1.2 (H)  0.1 - 1.0 K/uL Final   Eosinophils Relative 07/11/2023 5  % Final   Eosinophils Absolute 07/11/2023 0.5  0.0 - 0.5 K/uL Final   Basophils Relative 07/11/2023 1  % Final   Basophils Absolute 07/11/2023 0.1  0.0 - 0.1 K/uL Final   Immature Granulocytes 07/11/2023 0  % Final   Abs Immature Granulocytes 07/11/2023 0.02  0.00 - 0.07 K/uL Final   Performed at [facility], [address], [place], [place] [address]   Salicylate Lvl 07/11/2023 <7.0 (L)  7.0 - 30.0 mg/dL Final   Performed at [facility], [address] [place], [place] [address]   Acetaminophen  (Tylenol ), Serum 07/11/2023 <10 (L)  10 - 30 ug/mL Final   Comment: (NOTE) Therapeutic concentrations vary significantly. A range of 10-30 ug/mL  may be an effective concentration for many patients. However, some  are best treated at concentrations outside of this range. Acetaminophen  concentrations >150 ug/mL at 4 hours after ingestion  and >50 ug/mL at 12 hours after ingestion are often associated with  toxic reactions.  Performed at [facility], [address], [place], [place] [address]    Total CK 07/11/2023 223  49 - 397 U/L Final   Performed at [facility], [address], [place], [place] [address]   Ammonia 07/11/2023 56 (H)  9 - 35 umol/L Final   Performed at [facility], [address] [place], [place] [address]  No results displayed because visit has over 200 results.      Allergies: Penicillins  Medications:  Facility Ordered Medications  Medication   acetaminophen  (TYLENOL ) tablet 650 mg   alum & mag hydroxide-simeth (MAALOX/MYLANTA) 200-200-20 MG/5ML suspension 30 mL   magnesium  hydroxide (MILK OF MAGNESIA) suspension 30 mL   haloperidol  (HALDOL ) tablet 5 mg   And   diphenhydrAMINE  (BENADRYL ) capsule 50 mg   haloperidol  lactate (HALDOL ) injection 5 mg   And   diphenhydrAMINE  (BENADRYL ) injection 50 mg   And   LORazepam  (ATIVAN ) injection 2 mg   haloperidol  lactate (HALDOL ) injection 10 mg   And   diphenhydrAMINE  (BENADRYL ) injection 50 mg   And   LORazepam  (ATIVAN ) injection 2 mg   multivitamin with minerals tablet 1 tablet   chlordiazePOXIDE  (LIBRIUM ) capsule 25 mg   hydrOXYzine  (ATARAX ) tablet 25 mg   loperamide  (IMODIUM ) capsule 2-4 mg   ondansetron  (ZOFRAN -ODT) disintegrating tablet 4 mg   [START ON 09/06/2023] thiamine  (VITAMIN B1) tablet 100 mg   PTA Medications  Medication Sig   haloperidol  (HALDOL ) 5 MG tablet Take 1 tablet (5 mg total) by mouth 2 (two) times daily. (Patient not taking: Reported on 08/06/2023)   traZODone  (DESYREL ) 50 MG tablet Take 1 tablet (50 mg total) by mouth at bedtime as needed for sleep. (Patient not taking: Reported on 08/06/2023)   thiamine  (VITAMIN B-1) 100 MG tablet Take 1 tablet (100 mg total) by mouth daily. (Patient not taking: Reported on 08/06/2023)   hydrOXYzine  (ATARAX ) 25 MG tablet Take 1 tablet (25 mg total) by mouth 3 (three) times daily as needed for anxiety. (Patient not taking: Reported on 08/06/2023)      Medical Decision Making  Pt was assessed at [facility] yesterday by a psych provider and was recommended for admission to the  Continuous observation unit for safety monitoring at the [facility] and re-eval in the am.  Patient accepted and admitted to the Obs unit with CIWA protocol in  place.   However, patient's sodium is low at 128 from yesterday. After chart review, I did not see a medical clearance note before he was transferred to the [facility].  Order now placed for repeat CMP.    I had spoken with Dr Bradly Cage at [facility] earlier about transferring the patient to the Ed for medical clearance after labs are obtained and sodium still low.   Recommend Patient be transferred to the ED as staff is reporting they are unable to obtain blood for repeat labs due to him being a very hard stick.  EMTALA completed.    Recommendations  Based on my evaluation the patient appears to have an emergency medical condition for which I recommend the patient be transferred to the emergency department for further evaluation.  Recommend face to face eval by a psych provider for final disposition when medically stable.    Sandralee Crow, NP 09/05/23  5:10 AM

## 2023-09-05 NOTE — Progress Notes (Signed)
   09/05/23 0953  [facility] Triage Screening (Walk-ins at [facility] only)  How Did You Hear About Us ? Legal System  What Is the Reason for Your Visit/Call Today? Pt came from [facility] and is being tranferred to [facility] for alcohol use treatment.  How Long Has This Been Causing You Problems? <Week  Have You Recently Had Any Thoughts About Hurting Yourself? No  Are You Planning to Commit Suicide/Harm Yourself At This time? No  Have you Recently Had Thoughts About Hurting Someone Marigene Shoulder? No  Are You Planning To Harm Someone At This Time? No  Physical Abuse Denies  Verbal Abuse Denies  Sexual Abuse Denies  Exploitation of patient/patient's resources Denies  Self-Neglect Denies  Possible abuse reported to: Other (Comment)  Are you currently experiencing any auditory, visual or other hallucinations? No  Have You Used Any Alcohol or Drugs in the Past 24 Hours? Yes  What Did You Use and How Much? unsure of amount  Do you have any current medical co-morbidities that require immediate attention? No  Clinician description of patient physical appearance/behavior: calm, cooperative  What Do You Feel Would Help You the Most Today? Alcohol or Drug Use Treatment  If access to [facility] was not available, would you have sought care in the Emergency Department? No  Determination of Need Urgent (48 hours)  Options For Referral Facility-Based Crisis  Determination of Need filed? Yes

## 2023-09-05 NOTE — ED Provider Notes (Addendum)
 [name] Provider Note   CSN: [id number] Arrival date & time: 09/05/23  7829     Patient presents with: Psychiatric Evaluation   Jerry [name] is a 48 y.o. male.   HPI Patient sent from [facility] for correction of sodium.  Lab draw done yesterday showed a sodium of 128 and they had concerns for correction.  Patient is currently admitted to [facility] for alcohol and polysubstance use with depression and homelessness.  Plan is for medical clearance here and lab recheck then return to [facility] as inpatient.  Patient has no immediate complaints.    Prior to Admission medications   Medication Sig Start Date End Date Taking? Authorizing Provider  haloperidol  (HALDOL ) 5 MG tablet Take 1 tablet (5 mg total) by mouth 2 (two) times daily. Patient not taking: Reported on 08/06/2023 07/19/23   Ntuen, Tina C, FNP  hydrOXYzine  (ATARAX ) 25 MG tablet Take 1 tablet (25 mg total) by mouth 3 (three) times daily as needed for anxiety. Patient not taking: Reported on 08/06/2023 07/19/23   Ntuen, Tina C, FNP  thiamine  (VITAMIN B-1) 100 MG tablet Take 1 tablet (100 mg total) by mouth daily. Patient not taking: Reported on 08/06/2023 07/20/23   Ntuen, Tina C, FNP  traZODone  (DESYREL ) 50 MG tablet Take 1 tablet (50 mg total) by mouth at bedtime as needed for sleep. Patient not taking: Reported on 08/06/2023 07/19/23   Ntuen, Tina C, FNP    Allergies: Penicillins    Review of Systems  Updated Vital Signs BP (!) 133/97   Pulse 74   Temp 98.9 F (37.2 C) (Oral)   Resp 18   SpO2 97%   Physical Exam Constitutional:      Comments: Patient is sleepy.  He awakens to moderate stimulus.  This show holding poorly groomed.  Well-nourished well-developed.  HENT:     Head: Normocephalic and atraumatic.     Mouth/Throat:     Pharynx: Oropharynx is clear.   Eyes:     Pupils: Pupils are equal, round, and reactive to light.    Cardiovascular:     Rate and Rhythm: Normal rate and  regular rhythm.  Pulmonary:     Effort: Pulmonary effort is normal.     Breath sounds: Normal breath sounds.  Abdominal:     General: There is no distension.     Palpations: Abdomen is soft.     Tenderness: There is no abdominal tenderness. There is no guarding.   Skin:    General: Skin is warm and dry.   Neurological:     Comments: Sleeping quietly at the end of the room.  Arousable to light moderate stimulus.  No focal motor deficits.  Not actively tremulous at rest.    (all labs ordered are listed, but only abnormal results are displayed) Labs Reviewed  COMPREHENSIVE METABOLIC PANEL WITH GFR - Abnormal; Notable for the following components:      Result Value   Sodium 134 (*)    Potassium 3.2 (*)    Glucose, Bld 117 (*)    Total Protein 8.2 (*)    Albumin 3.2 (*)    AST 79 (*)    ALT 51 (*)    All other components within normal limits  CBC  RAPID URINE DRUG SCREEN, HOSP PERFORMED    EKG: None  Radiology: No results found.   .Incision and Drainage  Date/Time: 09/05/2023 8:20 AM  Performed by: Wynetta Heckle, MD Authorized by: Wynetta Heckle, MD  Consent:    Consent obtained:  Verbal   Consent given by:  Patient Universal protocol:    Patient identity confirmed:  Verbally with patient Location:    Type:  Abscess   Location:  Upper extremity   Upper extremity location:  Finger   Finger location:  R thumb Pre-procedure details:    Skin preparation:  Chlorhexidine  Sedation:    Sedation type:  None Anesthesia:    Anesthesia method:  None Procedure type:    Complexity:  Simple Procedure details:    Incision types:  Stab incision   Drainage:  Purulent   Drainage amount:  Scant   Wound treatment:  Wound left open Post-procedure details:    Procedure completion:  Tolerated Comments:     Patient had a superficially contained pus pocket over the lateral aspect of the right thumb.  See attached images.  Patient reports that he burned his fingers cooking  on outdoor cookware as he is homeless.  This is very welcome faint confined and limited requiring only a stab incision.    Medications Ordered in the ED  potassium chloride  SA (KLOR-CON  M) CR tablet 40 mEq (40 mEq Oral Given 09/05/23 0744)  folic acid  (FOLVITE ) tablet 1 mg (1 mg Oral Given 09/05/23 0744)  thiamine  (VITAMIN B1) tablet 100 mg (100 mg Oral Given 09/05/23 0754)  doxycycline  (VIBRA -TABS) tablet 100 mg (100 mg Oral Given 09/05/23 0810)                                    Medical Decision Making Amount and/or Complexity of Data Reviewed Labs: ordered.  Risk OTC drugs. Prescription drug management. Decision regarding hospitalization.   Lab results shows sodium of 134 potassium 3.2.  Patient has normal GFR.  Glucose 117.  Total bili 1.1.  Vital signs are stable with blood pressure 133/97.  Temp 98.9.  Heart rate 74 and oxygen saturation 97%.  Patient does not show signs of physiological withdrawal at this time.  Per previous ED note patient stopped drinking alcohol approximately 36 hours ago.  With lab values clarified and rechecked, patient stable to return to [facility] for treatment of alcohol dependency with potential for withdrawal and polysubstance abuse with depression and report of auditory hallucinations at prior evaluation.  Incidentally noted on physical exam was a self-contained small abscess on the right thumb.  Patient reports that he gets burns on his thumb from cooking outdoors as he is homeless.  This has converted to a pus pocket and has some surrounding erythema which looks like it is developing a paronychia.  A simple incision and drainage drained pus.  I will start the patient on doxycycline  1 mg twice daily for 7 days and Keflex  500 mg 4 times a day for 7 days for possible as there is the developing erythema in the surrounding area as well.  At this time no signs of tenosynovitis or deep space infection.  This can be clean to daily with dressing changes.  Patient is  medically cleared.  He does not need specific follow-up for the paronychia/localized  abscess.  It should be healing with antibiotics and local wound care.  If this is worsening or advancing, return to the emergency department for recheck.     Final diagnoses:  Polysubstance abuse (HCC)  Abscess of finger of right hand  Hypokalemia    ED Discharge Orders     None  Wynetta Heckle, MD 09/05/23 0830    Wynetta Heckle, MD 09/05/23 [id number]

## 2023-09-05 NOTE — ED Notes (Signed)
 Pt has two belonging bags in cabinets

## 2023-09-05 NOTE — ED Provider Notes (Signed)
 Behavioral Health Urgent Care Medical Screening Exam  Patient Name: Jerry [name] MRN: [id number] Date of Evaluation: 09/05/23 Chief Complaint:   Diagnosis:  Final diagnoses:  None    History of Present illness: Jerry [name] is a 48 y.o. male.    HPI:  Per chart review:   Jerry [name] is a 48 y.o. male who presented to the ED on  09/04/2023  2:56 PM via EMS for Psychosis, AH.with psychiatric diagnoses of alcohol abuse, schizophrenia, history of Dts, polysubstance abuse, GAD tobacco abuse, malingering, PTSD, tardive dyskinesia, and has a past medical history of  Seizure Disorder. He presented to the ED with AH and shakes/tremors on his hand, and diaphoretic.   Patient was transferred to the [facility] as a direct admit from [facility]  for continuous observation overnight and re-eval in the am.  Patient was evaluated by attending provider at [facility]  and admission to Observation unit was initiated. However, staff unable to draw labs as expected. Chart review indicated abnormal sodium level  (128) which was not addressed prior to transfer to [facility]. Patient was transferred back to ED for medical clearance.   Today, presents to this facility from [facility], with chief complaint of substance use problem and requesting treatment.   Assessment: 48 year-old male who appears older than his stated age. He appears tired and restless. He is disheveled and malodorous, with poor hygiene.  Alert and oriented x 4. He has poor eye contact. His speech is slurred, with decreased volume. His mood is  and depressed and anxious. His thought process is coherent. He denies SI/HI/AVH but admits to having hallucinations yesterday.  Patient reports his main problem is substance use: admits to drinking vodka every day, about a fifth, and using crack/cocaine daily.  Currently BAL=0. Patient keeps saying I need treatment for my substance abuse, I am not suicidal..not hearing voices. He then refuses to participate in this assessment and  asks for food stating I did not eat any thing this morning, can I at least have something to eat?Aaron Aas  Food and beverages  provided.    Provider attempts to discuss about available substance abuse outpatient services and patient states I am not going to do no outpatient services, just get me out of here, and give me a bus pass. Going straight back to the street. Patient leaves the room stating I don't need no resources from you, them doctors get that much  money for nothing.        Per [facility] NP chart review, Patient reports he drank Alcohol last night and was unable to quantify his alcohol use last night. Patient also reported he use Cocaine last night as well. Patient reports hearing voices telling him to kill himself or they will kill him. Patient was hospitalized at [facility] for same complaint. Patient back then planned to go to rehab after treatment. Today he admitted he went back to the street and stated drinking again. Patient is homeless and states Alcohol and drugs are everywhere. And reports he had alcohol withdrawal seizures four years ago and denied any seizures since yesterday.. Patient is Disheveled, unkempt and tearfully stated that he want to stop drinking Alcohol and using Cocaine. Current outpatient psychotropic medications include none and historically he has had no medications since discharge from the Psychiatry unit . He was not compliant with medications prior to admission as evidenced by his report.         Flowsheet Row ED from 09/05/2023 in [facility]  Center ED from 09/04/2023 in [facility] Emergency Department at [facility] ED from 08/04/2023 in [facility] Emergency Department at [facility]  C-SSRS RISK CATEGORY Error: Q3, 4, or 5 should not be populated when Q2 is No No Risk Error: Q3, 4, or 5 should not be populated when Q2 is No    Psychiatric Specialty Exam  Presentation  General Appearance:Disheveled; Other (comment) (poor  hygiene)  Eye Contact:Poor  Speech:Slurred (hoarse)  Speech Volume:Other (comment)  Handedness:Right   Mood and Affect  Mood: Anxious  Affect: Flat; Constricted   Thought Process  Thought Processes: Coherent; Goal Directed  Descriptions of Associations:Intact  Orientation:Full (Time, Place and Person)  Thought Content:WDL  Diagnosis of Schizophrenia or Schizoaffective disorder in past: Yes  Duration of Psychotic Symptoms: Greater than six months  Hallucinations:None None  Ideas of Reference:None  Suicidal Thoughts:No  Homicidal Thoughts:No   Sensorium  Memory: Immediate Fair; Recent Fair; Remote Poor  Judgment: Fair  Insight: Fair   Art therapist  Concentration: Poor  Attention Span: Poor  Recall: [employer] of Knowledge: Fair  Language: Poor   Psychomotor Activity  Psychomotor Activity: Restlessness   Assets  Assets: Manufacturing systems engineer; Desire for Improvement   Sleep  Sleep: Fair  Number of hours:  8   Physical Exam: Physical Exam Vitals and nursing note reviewed.  HENT:     Head: Normocephalic and atraumatic.     Right Ear: Tympanic membrane normal.     Left Ear: Tympanic membrane normal.     Nose: Nose normal.     Mouth/Throat:     Mouth: Mucous membranes are moist.   Eyes:     Extraocular Movements: Extraocular movements intact.     Pupils: Pupils are equal, round, and reactive to light.    Cardiovascular:     Rate and Rhythm: Normal rate.     Pulses: Normal pulses.  Pulmonary:     Effort: Pulmonary effort is normal.   Musculoskeletal:        General: Normal range of motion.     Cervical back: Normal range of motion.   Neurological:     General: No focal deficit present.     Mental Status: He is alert and oriented to person, place, and time.    Review of Systems  Constitutional: Negative.   HENT: Negative.    Eyes: Negative.   Respiratory: Negative.    Cardiovascular: Negative.    Gastrointestinal: Negative.   Genitourinary: Negative.   Musculoskeletal: Negative.   Skin: Negative.   Neurological: Negative.   Endo/Heme/Allergies: Negative.   Psychiatric/Behavioral:  Positive for substance abuse.    Blood pressure (!) 116/90, pulse 75, temperature 98.8 F (37.1 C), temperature source Oral, resp. rate 17, SpO2 99%. There is no height or weight on file to calculate BMI.  Musculoskeletal: Strength & Muscle Tone: within normal limits Gait & Station: normal Patient leans: N/A   [facility] MSE Discharge Disposition for Follow up and Recommendations: Based on my evaluation the patient does not appear to have an emergency medical condition and can be discharged with resources and follow up care in outpatient services for Medication Management, Substance Abuse Intensive Outpatient Program, Individual Therapy, and Group Therapy   Elston Halsted, NP 09/05/2023, 10:24 AM

## 2023-09-05 NOTE — ED Notes (Signed)
 Staff attempted numerous time to no avail blood draw of CMP.  NP Chinwendu Onuoha notified.  Pt pending transfer to [facility] for medical clearance. Serum Sodium prior to transfer 128.

## 2023-09-06 ENCOUNTER — Ambulatory Visit (HOSPITAL_COMMUNITY)
Admission: EM | Admit: 2023-09-06 | Discharge: 2023-09-07 | Payer: MEDICAID | Attending: Psychiatry | Admitting: Psychiatry

## 2023-09-06 DIAGNOSIS — F1910 Other psychoactive substance abuse, uncomplicated: Secondary | ICD-10-CM

## 2023-09-06 DIAGNOSIS — R45851 Suicidal ideations: Secondary | ICD-10-CM | POA: Insufficient documentation

## 2023-09-06 DIAGNOSIS — I517 Cardiomegaly: Secondary | ICD-10-CM | POA: Insufficient documentation

## 2023-09-06 DIAGNOSIS — I491 Atrial premature depolarization: Secondary | ICD-10-CM | POA: Insufficient documentation

## 2023-09-06 DIAGNOSIS — F10239 Alcohol dependence with withdrawal, unspecified: Secondary | ICD-10-CM | POA: Insufficient documentation

## 2023-09-06 DIAGNOSIS — F1510 Other stimulant abuse, uncomplicated: Secondary | ICD-10-CM | POA: Insufficient documentation

## 2023-09-06 DIAGNOSIS — R443 Hallucinations, unspecified: Secondary | ICD-10-CM

## 2023-09-06 DIAGNOSIS — Z5900 Homelessness unspecified: Secondary | ICD-10-CM | POA: Insufficient documentation

## 2023-09-06 DIAGNOSIS — Z9151 Personal history of suicidal behavior: Secondary | ICD-10-CM | POA: Insufficient documentation

## 2023-09-06 DIAGNOSIS — F259 Schizoaffective disorder, unspecified: Secondary | ICD-10-CM | POA: Insufficient documentation

## 2023-09-06 LAB — POCT URINE DRUG SCREEN - MANUAL ENTRY (I-SCREEN)
POC Amphetamine UR: NOT DETECTED
POC Buprenorphine (BUP): NOT DETECTED
POC Cocaine UR: POSITIVE — AB
POC Marijuana UR: POSITIVE — AB
POC Methadone UR: NOT DETECTED
POC Methamphetamine UR: NOT DETECTED
POC Morphine: NOT DETECTED
POC Oxazepam (BZO): POSITIVE — AB
POC Oxycodone UR: NOT DETECTED
POC Secobarbital (BAR): NOT DETECTED

## 2023-09-06 MED ORDER — LORAZEPAM 2 MG/ML IJ SOLN: 2.0000 mg | Freq: Three times a day (TID) | INTRAMUSCULAR | Status: DC | PRN

## 2023-09-06 MED ORDER — ADULT MULTIVITAMIN W/MINERALS CH
1.0000 | ORAL_TABLET | Freq: Every day | ORAL | Status: DC
  Administered 2023-09-06 – 2023-09-07 (×2): 1 via ORAL
  Filled 2023-09-06 (×2): qty 1

## 2023-09-06 MED ORDER — THIAMINE MONONITRATE 100 MG PO TABS
100.0000 mg | ORAL_TABLET | Freq: Every day | ORAL | Status: DC
  Administered 2023-09-07: 100 mg via ORAL
  Filled 2023-09-06: qty 1

## 2023-09-06 MED ORDER — HALOPERIDOL LACTATE 5 MG/ML IJ SOLN: 10.0000 mg | Freq: Three times a day (TID) | INTRAMUSCULAR | Status: DC | PRN

## 2023-09-06 MED ORDER — HALOPERIDOL 5 MG PO TABS: 5.0000 mg | ORAL_TABLET | Freq: Three times a day (TID) | ORAL | Status: DC | PRN

## 2023-09-06 MED ORDER — ACETAMINOPHEN 325 MG PO TABS
650.0000 mg | ORAL_TABLET | Freq: Four times a day (QID) | ORAL | Status: DC | PRN
  Administered 2023-09-06: 650 mg via ORAL
  Filled 2023-09-06: qty 2

## 2023-09-06 MED ORDER — HALOPERIDOL LACTATE 5 MG/ML IJ SOLN: 5.0000 mg | Freq: Three times a day (TID) | INTRAMUSCULAR | Status: DC | PRN

## 2023-09-06 MED ORDER — THIAMINE HCL 100 MG/ML IJ SOLN
100.0000 mg | Freq: Once | INTRAMUSCULAR | Status: AC
  Administered 2023-09-06: 100 mg via INTRAMUSCULAR
  Filled 2023-09-06: qty 2

## 2023-09-06 MED ORDER — BENZTROPINE MESYLATE 0.5 MG PO TABS
0.5000 mg | ORAL_TABLET | Freq: Once | ORAL | Status: AC
  Administered 2023-09-06: 0.5 mg via ORAL
  Filled 2023-09-06: qty 1

## 2023-09-06 MED ORDER — LOPERAMIDE HCL 2 MG PO CAPS: 2.0000 mg | ORAL_CAPSULE | ORAL | Status: DC | PRN

## 2023-09-06 MED ORDER — LORAZEPAM 1 MG PO TABS
1.0000 mg | ORAL_TABLET | Freq: Four times a day (QID) | ORAL | Status: DC | PRN
  Administered 2023-09-06 – 2023-09-07 (×2): 1 mg via ORAL
  Filled 2023-09-06 (×2): qty 1

## 2023-09-06 MED ORDER — LORAZEPAM 1 MG PO TABS
1.0000 mg | ORAL_TABLET | Freq: Once | ORAL | Status: AC
  Administered 2023-09-06: 1 mg via ORAL
  Filled 2023-09-06: qty 1

## 2023-09-06 MED ORDER — ALUM & MAG HYDROXIDE-SIMETH 200-200-20 MG/5ML PO SUSP: 30.0000 mL | ORAL | Status: DC | PRN

## 2023-09-06 MED ORDER — DIPHENHYDRAMINE HCL 50 MG PO CAPS: 50.0000 mg | ORAL_CAPSULE | Freq: Three times a day (TID) | ORAL | Status: DC | PRN

## 2023-09-06 MED ORDER — DIPHENHYDRAMINE HCL 50 MG/ML IJ SOLN: 50.0000 mg | Freq: Three times a day (TID) | INTRAMUSCULAR | Status: DC | PRN

## 2023-09-06 MED ORDER — TRAZODONE HCL 50 MG PO TABS: 50.0000 mg | ORAL_TABLET | Freq: Every evening | ORAL | Status: DC | PRN

## 2023-09-06 MED ORDER — MAGNESIUM HYDROXIDE 400 MG/5ML PO SUSP: 30.0000 mL | Freq: Every day | ORAL | Status: DC | PRN

## 2023-09-06 MED ORDER — HYDROXYZINE HCL 25 MG PO TABS: 25.0000 mg | ORAL_TABLET | Freq: Four times a day (QID) | ORAL | Status: DC | PRN

## 2023-09-06 MED ORDER — HALOPERIDOL 5 MG PO TABS
5.0000 mg | ORAL_TABLET | Freq: Two times a day (BID) | ORAL | Status: DC
  Administered 2023-09-06 – 2023-09-07 (×2): 5 mg via ORAL
  Filled 2023-09-06 (×2): qty 1

## 2023-09-06 MED ORDER — ONDANSETRON 4 MG PO TBDP
4.0000 mg | ORAL_TABLET | Freq: Four times a day (QID) | ORAL | Status: DC | PRN
  Administered 2023-09-06: 4 mg via ORAL
  Filled 2023-09-06: qty 1

## 2023-09-06 NOTE — ED Notes (Signed)
 Pt is seen withdrawing from alcohol. He reports nausea, anxiety, tremors, HA and stomach pain rated 8/10. PRN medications given as per NP order. Pt seen eating some of his snack. He denies SI/HI. Pt reports hearing voices saying to just kill yourself. Pt verbally contracts for safety. He is medication complaint and safe on the unit at this time with facility protocol safety checks in place.
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
## 2023-09-06 NOTE — ED Provider Notes (Signed)
 [facility] Continuous Assessment Admission H&P  Date: 09/06/23 Patient Name: Jerry [name] MRN: [id number] Chief Complaint: I really need to be here  Diagnoses:  Final diagnoses:  Polysubstance abuse (HCC)  Hallucinations    HPI: History of Present illness: Jerry [name] is a 48 y.o. male.    Per chart review:   Jerry [name] is a 48 y.o. male who presented to the ED on  09/04/2023  2:56 PM via EMS for Psychosis, AH.with psychiatric diagnoses of alcohol abuse, schizophrenia, history of Dts, polysubstance abuse, GAD tobacco abuse, malingering, PTSD, tardive dyskinesia, and has a past medical history of  Seizure Disorder. He presented to the ED with AH and shakes/tremors on his hand, and diaphoretic.   On 09/05/2023, Patient was transferred to the [facility] as a direct admit from [facility]  for continuous observation overnight and re-eval in the am.  Patient was evaluated by attending provider at [facility]  and admission to Observation unit was initiated. However, staff unable to draw labs as expected. Chart review indicated abnormal sodium level  (128) which was not addressed prior to transfer to [facility]. Patient was transferred back to ED for medical clearance.    Yesterday, he presented  to this facility from [facility], with chief complaint of substance use problem and requesting treatment. He was evaluated:     48 year-old male who appears older than his stated age. He appears tired and restless. He is disheveled and malodorous, with poor hygiene.  Alert and oriented x 4. He has poor eye contact. His speech is slurred, with decreased volume. His mood is  and depressed and anxious. His thought process is coherent. He denies SI/HI/AVH but admits to having hallucinations yesterday.  Patient reports his main problem is substance use: admits to drinking vodka every day, about a fifth, and using crack/cocaine daily.  Currently BAL=0. Patient keeps saying I need treatment for my substance abuse, I am not  suicidal..not hearing voices. He then refuses to participate in this assessment and asks for food stating I did not eat any thing this morning, can I at least have something to eat?Aaron Aas  Food and beverages  provided.    Provider attempted  to discuss about available substance abuse outpatient services and patient states I am not going to do no outpatient services, just get me out of here, and give me a bus pass. Going straight back to the street. Patient leaves the room stating I don't need no resources from you, them doctors get that much  money for nothing.    Today, patient returns to [facility] with chief complaint of I am shaking badly, I nee treatment. He admits that upon departure from this facility yesterday, he used alcohol again (liquor). He reports he has been experiencing symptoms including abdominal cramps, dry mouth, nausea and increased anxiety. Reports he wants to stay away from alcohol and outpatient services are not the best option for now. Reports he wants to go  to a rehab program once stabilized. Patient denies SI but reports auditory hallucinations. He denies homicidal ideations. Reports his appetite is fair. Has not slept since yesterday.    Substance abuse treatment recommended. Patient will be admitted to observation as there is no bed available at [facility]. Aaron Aas CIWA protocol initiated.   Total Time spent with patient: 45 minutes  Musculoskeletal  Strength & Muscle Tone: within normal limits Gait & Station: normal Patient leans: N/A  Psychiatric Specialty Exam  Presentation General Appearance:  Disheveled  Eye  Contact: Poor  Speech: Garbled; Slurred  Speech Volume: Decreased  Handedness: Right   Mood and Affect  Mood: Anxious; Depressed  Affect: Depressed   Thought Process  Thought Processes: Coherent  Descriptions of Associations:Intact  Orientation:Full (Time, Place and Person)  Thought Content:WDL  Diagnosis of Schizophrenia or Schizoaffective  disorder in past: Yes  Duration of Psychotic Symptoms: Greater than six months  Hallucinations:Hallucinations: Auditory Description of Auditory Hallucinations: too many voices in my mind  Ideas of Reference:None  Suicidal Thoughts:Suicidal Thoughts: No  Homicidal Thoughts:Homicidal Thoughts: No   Sensorium  Memory: Immediate Fair; Recent Fair; Remote Poor  Judgment: Poor  Insight: Fair   Chartered certified accountant: Fair  Attention Span: Fair  Recall: [employer] of Knowledge: Fair  Language: Fair   Psychomotor Activity  Psychomotor Activity: Psychomotor Activity: Restlessness   Assets  Assets: Manufacturing systems engineer; Desire for Improvement   Sleep  Sleep: Sleep: Poor   Nutritional Assessment (For OBS and FBC admissions only) Has the patient had a weight loss or gain of 10 pounds or more in the last 3 months?: No Has the patient had a decrease in food intake/or appetite?: No Does the patient have dental problems?: No Does the patient have eating habits or behaviors that may be indicators of an eating disorder including binging or inducing vomiting?: No Has the patient recently lost weight without trying?: 0 Has the patient been eating poorly because of a decreased appetite?: 0 Malnutrition Screening Tool Score: 0    Physical Exam Vitals and nursing note reviewed.  HENT:     Head: Normocephalic and atraumatic.     Right Ear: Tympanic membrane normal.     Left Ear: Tympanic membrane normal.     Nose: Nose normal.     Mouth/Throat:     Mouth: Mucous membranes are dry.   Eyes:     Extraocular Movements: Extraocular movements intact.     Pupils: Pupils are equal, round, and reactive to light.   Pulmonary:     Effort: Pulmonary effort is normal.   Musculoskeletal:        General: Normal range of motion.   Neurological:     General: No focal deficit present.     Mental Status: He is alert and oriented to person, place, and time.     Review of Systems  Constitutional: Negative.   HENT: Negative.    Eyes: Negative.   Respiratory: Negative.    Cardiovascular: Negative.   Gastrointestinal: Negative.   Genitourinary: Negative.   Musculoskeletal: Negative.   Skin: Negative.   Neurological: Negative.   Endo/Heme/Allergies: Negative.   Psychiatric/Behavioral:  Positive for hallucinations and substance abuse. The patient is nervous/anxious.     There were no vitals taken for this visit. There is no height or weight on file to calculate BMI.  Past Psychiatric History: Schizophrenia, polysubstance use   Is the patient at risk to self? No  Has the patient been a risk to self in the past 6 months? No .    Has the patient been a risk to self within the distant past? No   Is the patient a risk to others? No   Has the patient been a risk to others in the past 6 months? No   Has the patient been a risk to others within the distant past? No   Past Medical History: see chart.   Family History: NA  Social History: Homeless. Unemployed. No support system  Last Labs:  Admission on 09/05/2023,  Discharged on 09/05/2023  Component Date Value Ref Range Status   Sodium 09/05/2023 134 (L)  135 - 145 mmol/L Final   Potassium 09/05/2023 3.2 (L)  3.5 - 5.1 mmol/L Final   Chloride 09/05/2023 100  98 - 111 mmol/L Final   CO2 09/05/2023 24  22 - 32 mmol/L Final   Glucose, Bld 09/05/2023 117 (H)  70 - 99 mg/dL Final   Glucose reference range applies only to samples taken after fasting for at least 8 hours.   BUN 09/05/2023 7  6 - 20 mg/dL Final   Creatinine, Ser 09/05/2023 0.77  0.61 - 1.24 mg/dL Final   Calcium 03/22/7251 9.0  8.9 - 10.3 mg/dL Final   Total Protein 66/44/0347 8.2 (H)  6.5 - 8.1 g/dL Final   Albumin 42/59/5638 3.2 (L)  3.5 - 5.0 g/dL Final   AST 75/64/3329 79 (H)  15 - 41 U/L Final   ALT 09/05/2023 51 (H)  0 - 44 U/L Final   Alkaline Phosphatase 09/05/2023 68  38 - 126 U/L Final   Total Bilirubin 09/05/2023  1.1  0.0 - 1.2 mg/dL Final   GFR, Estimated 09/05/2023 >60  >60 mL/min Final   Comment: (NOTE) Calculated using the CKD-EPI Creatinine Equation (2021)    Anion gap 09/05/2023 10  5 - 15 Final   Performed at [facility], [address], [place], [place] [address]   WBC 09/05/2023 9.6  4.0 - 10.5 K/uL Final   RBC 09/05/2023 4.24  4.22 - 5.81 MIL/uL Final   Hemoglobin 09/05/2023 13.9  13.0 - 17.0 g/dL Final   HCT 16/60/6301 41.2  39.0 - 52.0 % Final   MCV 09/05/2023 97.2  80.0 - 100.0 fL Final   MCH 09/05/2023 32.8  26.0 - 34.0 pg Final   MCHC 09/05/2023 33.7  30.0 - 36.0 g/dL Final   RDW 60/12/9321 13.3  11.5 - 15.5 % Final   Platelets 09/05/2023 305  150 - 400 K/uL Final   nRBC 09/05/2023 0.0  0.0 - 0.2 % Final   Performed at [facility], [address] [place], [place] [address]  Admission on 09/04/2023, Discharged on 09/05/2023  Component Date Value Ref Range Status   Sodium 09/04/2023 128 (L)  135 - 145 mmol/L Final   Potassium 09/04/2023 3.0 (L)  3.5 - 5.1 mmol/L Final   Chloride 09/04/2023 97 (L)  98 - 111 mmol/L Final   CO2 09/04/2023 21 (L)  22 - 32 mmol/L Final   Glucose, Bld 09/04/2023 132 (H)  70 - 99 mg/dL Final   Glucose reference range applies only to samples taken after fasting for at least 8 hours.   BUN 09/04/2023 8  6 - 20 mg/dL Final   Creatinine, Ser 09/04/2023 0.86  0.61 - 1.24 mg/dL Final   Calcium 20/25/4270 8.1 (L)  8.9 - 10.3 mg/dL Final   Total Protein 62/37/6283 7.9  6.5 - 8.1 g/dL Final   Albumin 15/17/6160 3.2 (L)  3.5 - 5.0 g/dL Final   AST 73/71/0626 74 (H)  15 - 41 U/L Final   ALT 09/04/2023 48 (H)  0 - 44 U/L Final   Alkaline Phosphatase 09/04/2023 66  38 - 126 U/L Final   Total Bilirubin 09/04/2023 0.7  0.0 - 1.2 mg/dL Final   GFR, Estimated 09/04/2023 >60  >60 mL/min Final   Comment: (NOTE) Calculated using the CKD-EPI Creatinine Equation (2021)    Anion gap 09/04/2023 10  5 - 15 Final  Performed at  [facility], [address] [place], [place] [address]   Alcohol, Ethyl (B) 09/04/2023 <15  <15 mg/dL Final   Comment: (NOTE) For medical purposes only. Performed at [facility], [address], [place], [place] [address]    Opiates 09/04/2023 NONE DETECTED  NONE DETECTED Final   Cocaine 09/04/2023 POSITIVE (A)  NONE DETECTED Final   Benzodiazepines 09/04/2023 NONE DETECTED  NONE DETECTED Final   Amphetamines 09/04/2023 NONE DETECTED  NONE DETECTED Final   Tetrahydrocannabinol 09/04/2023 NONE DETECTED  NONE DETECTED Final   Barbiturates 09/04/2023 NONE DETECTED  NONE DETECTED Final   Comment: (NOTE) DRUG SCREEN FOR MEDICAL PURPOSES ONLY.  IF CONFIRMATION IS NEEDED FOR ANY PURPOSE, NOTIFY LAB WITHIN 5 DAYS.  LOWEST DETECTABLE LIMITS FOR URINE DRUG SCREEN Drug Class                     Cutoff (ng/mL) Amphetamine and metabolites    1000 Barbiturate and metabolites    200 Benzodiazepine                 200 Opiates and metabolites        300 Cocaine and metabolites        300 THC                            50 Performed at [facility], [address], [place], [place] [address]    WBC 09/04/2023 10.4  4.0 - 10.5 K/uL Final   RBC 09/04/2023 4.00 (L)  4.22 - 5.81 MIL/uL Final   Hemoglobin 09/04/2023 13.2  13.0 - 17.0 g/dL Final   HCT 21/30/8657 38.8 (L)  39.0 - 52.0 % Final   MCV 09/04/2023 97.0  80.0 - 100.0 fL Final   MCH 09/04/2023 33.0  26.0 - 34.0 pg Final   MCHC 09/04/2023 34.0  30.0 - 36.0 g/dL Final   RDW 84/69/6295 13.2  11.5 - 15.5 % Final   Platelets 09/04/2023 276  150 - 400 K/uL Final   nRBC 09/04/2023 0.0  0.0 - 0.2 % Final   Neutrophils Relative % 09/04/2023 83  % Final   Neutro Abs 09/04/2023 8.6 (H)  1.7 - 7.7 K/uL Final   Lymphocytes Relative 09/04/2023 7  % Final   Lymphs Abs 09/04/2023 0.7  0.7 - 4.0 K/uL Final   Monocytes Relative 09/04/2023 10  % Final   Monocytes Absolute 09/04/2023 1.1 (H)  0.1  - 1.0 K/uL Final   Eosinophils Relative 09/04/2023 0  % Final   Eosinophils Absolute 09/04/2023 0.0  0.0 - 0.5 K/uL Final   Basophils Relative 09/04/2023 0  % Final   Basophils Absolute 09/04/2023 0.0  0.0 - 0.1 K/uL Final   Immature Granulocytes 09/04/2023 0  % Final   Abs Immature Granulocytes 09/04/2023 0.03  0.00 - 0.07 K/uL Final   Performed at [facility], [address], [place], [place] [address]   Potassium 09/05/2023 3.6  3.5 - 5.1 mmol/L Final   Performed at [facility], [address], [place], [place] [address]  Admission on 08/04/2023, Discharged on 08/07/2023  Component Date Value Ref Range Status   Sodium 08/04/2023 132 (L)  135 - 145 mmol/L Final   Potassium 08/04/2023 4.0  3.5 - 5.1 mmol/L Final   Chloride 08/04/2023 99  98 - 111 mmol/L Final   CO2 08/04/2023 25  22 - 32  mmol/L Final   Glucose, Bld 08/04/2023 100 (H)  70 - 99 mg/dL Final   Glucose reference range applies only to samples taken after fasting for at least 8 hours.   BUN 08/04/2023 10  6 - 20 mg/dL Final   Creatinine, Ser 08/04/2023 0.98  0.61 - 1.24 mg/dL Final   Calcium 21/30/8657 9.6  8.9 - 10.3 mg/dL Final   Total Protein 84/69/6295 9.1 (H)  6.5 - 8.1 g/dL Final   Albumin 28/41/3244 4.1  3.5 - 5.0 g/dL Final   AST 03/22/7251 32  15 - 41 U/L Final   ALT 08/04/2023 23  0 - 44 U/L Final   Alkaline Phosphatase 08/04/2023 59  38 - 126 U/L Final   Total Bilirubin 08/04/2023 1.1  0.0 - 1.2 mg/dL Final   GFR, Estimated 08/04/2023 >60  >60 mL/min Final   Comment: (NOTE) Calculated using the CKD-EPI Creatinine Equation (2021)    Anion gap 08/04/2023 8  5 - 15 Final   Performed at [facility], [address], [place], [place] [address]   Alcohol, Ethyl (B) 08/04/2023 <15  <15 mg/dL Final   Comment: Please note change in reference range. (NOTE) For medical purposes only. Performed at [facility], [address] [place], [place] [address]    Opiates 08/04/2023 NONE DETECTED  NONE DETECTED Final   Cocaine 08/04/2023 POSITIVE (A)  NONE DETECTED Final   Benzodiazepines 08/04/2023 POSITIVE (A)  NONE DETECTED Final   Amphetamines 08/04/2023 POSITIVE (A)  NONE DETECTED Final   Comment: (NOTE) Trazodone  is metabolized in vivo to several metabolites, including pharmacologically active m-CPP, which is excreted in the urine. Immunoassay screens for amphetamines and MDMA have potential cross-reactivity with these compounds and may provide false positive  results.     Tetrahydrocannabinol 08/04/2023 NONE DETECTED  NONE DETECTED Final   Barbiturates 08/04/2023 NONE DETECTED  NONE DETECTED Final   Comment: (NOTE) DRUG SCREEN FOR MEDICAL PURPOSES ONLY.  IF CONFIRMATION IS NEEDED FOR ANY PURPOSE, NOTIFY LAB WITHIN 5 DAYS.  LOWEST DETECTABLE LIMITS FOR URINE DRUG SCREEN Drug Class                     Cutoff (ng/mL) Amphetamine and metabolites    1000 Barbiturate and metabolites    200 Benzodiazepine                 200 Opiates and metabolites        300 Cocaine and metabolites        300 THC                            50 Performed at [facility], [address] [place], [place] [address]    WBC 08/04/2023 7.4  4.0 - 10.5 K/uL Final   RBC 08/04/2023 4.07 (L)  4.22 - 5.81 MIL/uL Final   Hemoglobin 08/04/2023 13.5  13.0 - 17.0 g/dL Final   HCT 87/56/4332 40.7  39.0 - 52.0 % Final   MCV 08/04/2023 100.0  80.0 - 100.0 fL Final   MCH 08/04/2023 33.2  26.0 - 34.0 pg Final   MCHC 08/04/2023 33.2  30.0 - 36.0 g/dL Final   RDW 95/18/8416 13.3  11.5 - 15.5 % Final   Platelets 08/04/2023 329  150 - 400 K/uL Final   nRBC 08/04/2023 0.0  0.0 - 0.2 % Final   Neutrophils Relative % 08/04/2023 69  % Final  Neutro Abs 08/04/2023 5.1  1.7 - 7.7 K/uL Final   Lymphocytes Relative 08/04/2023 19  % Final   Lymphs Abs 08/04/2023 1.4  0.7 - 4.0 K/uL Final   Monocytes Relative 08/04/2023 10  %  Final   Monocytes Absolute 08/04/2023 0.8  0.1 - 1.0 K/uL Final   Eosinophils Relative 08/04/2023 1  % Final   Eosinophils Absolute 08/04/2023 0.1  0.0 - 0.5 K/uL Final   Basophils Relative 08/04/2023 0  % Final   Basophils Absolute 08/04/2023 0.0  0.0 - 0.1 K/uL Final   Immature Granulocytes 08/04/2023 1  % Final   Abs Immature Granulocytes 08/04/2023 0.04  0.00 - 0.07 K/uL Final   Performed at [facility], [address] [place], [place] [address]  Admission on 07/14/2023, Discharged on 07/19/2023  Component Date Value Ref Range Status   Sodium 07/19/2023 131 (L)  135 - 145 mmol/L Final   Potassium 07/19/2023 4.3  3.5 - 5.1 mmol/L Final   Chloride 07/19/2023 98  98 - 111 mmol/L Final   CO2 07/19/2023 23  22 - 32 mmol/L Final   Glucose, Bld 07/19/2023 104 (H)  70 - 99 mg/dL Final   Glucose reference range applies only to samples taken after fasting for at least 8 hours.   BUN 07/19/2023 16  6 - 20 mg/dL Final   Creatinine, Ser 07/19/2023 0.76  0.61 - 1.24 mg/dL Final   Calcium 19/14/7829 9.2  8.9 - 10.3 mg/dL Final   Total Protein 56/21/3086 8.5 (H)  6.5 - 8.1 g/dL Final   Albumin 57/84/6962 3.5  3.5 - 5.0 g/dL Final   AST 95/28/4132 30  15 - 41 U/L Final   ALT 07/19/2023 34  0 - 44 U/L Final   Alkaline Phosphatase 07/19/2023 51  38 - 126 U/L Final   Total Bilirubin 07/19/2023 0.6  0.0 - 1.2 mg/dL Final   GFR, Estimated 07/19/2023 >60  >60 mL/min Final   Comment: (NOTE) Calculated using the CKD-EPI Creatinine Equation (2021)    Anion gap 07/19/2023 10  5 - 15 Final   Performed at [facility], [address], [place], [place] [address]   Hgb A1c MFr Bld 07/19/2023 4.8  4.8 - 5.6 % Final   Comment: (NOTE) Pre diabetes:          5.7%-6.4%  Diabetes:              >6.4%  Glycemic control for   <7.0% adults with diabetes    Mean Plasma Glucose 07/19/2023 91.06  mg/dL Final   Performed at [facility], [address]  [place], [place] [address]   Cholesterol 07/19/2023 168  0 - 200 mg/dL Final   Triglycerides 66/44/0347 120  <150 mg/dL Final   HDL 42/59/5638 42  >40 mg/dL Final   Total CHOL/HDL Ratio 07/19/2023 4.0  RATIO Final   VLDL 07/19/2023 24  0 - 40 mg/dL Final   LDL Cholesterol 07/19/2023 102 (H)  0 - 99 mg/dL Final   Comment:        Total Cholesterol/HDL:CHD Risk Coronary Heart Disease Risk Table                     Men   Women  1/2 Average Risk   3.4   3.3  Average Risk       5.0   4.4  2 X Average Risk   9.6   7.1  3 X Average Risk  23.4   11.0        Use the calculated Patient Ratio above and the CHD Risk Table to determine the patient's CHD Risk.        ATP III CLASSIFICATION (LDL):  <100     mg/dL   Optimal  865-784  mg/dL   Near or Above                    Optimal  130-159  mg/dL   Borderline  696-295  mg/dL   High  >284     mg/dL   Very High Performed at [facility], [address], [place], [place] [address]    RPR Ser Ql 07/19/2023 NON REACTIVE  NON REACTIVE Final   Performed at [facility], [address], [place], [place] [address]   TSH 07/19/2023 1.931  0.350 - 4.500 uIU/mL Final   Comment: Performed by a 3rd Generation assay with a functional sensitivity of <=0.01 uIU/mL. Performed at [facility], [address], [place], [place] [address]    Vit D, 25-Hydroxy 07/19/2023 32.02  30 - 100 ng/mL Final   Comment: (NOTE) Vitamin D  deficiency has been defined by the Institute of Medicine  and an Endocrine Society practice guideline as a level of serum 25-OH  vitamin D  less than 20 ng/mL (1,2). The Endocrine Society went on to  further define vitamin D  insufficiency as a level between 21 and 29  ng/mL (2).  1. IOM (Institute of Medicine). 2010. Dietary reference intakes for  calcium and D. [place]: [employer]. 2. Holick MF, Binkley Ferguson, Bischoff-Ferrari HA, et al. Evaluation,  treatment, and prevention of  vitamin D  deficiency: an Endocrine  Society clinical practice guideline, JCEM. 2011 Jul; 96(7): 1911-30.  Performed at [facility], [address] [place], [place] [address]    Vitamin B-12 07/19/2023 269  180 - 914 pg/mL Final   Comment: (NOTE) This assay is not validated for testing neonatal or myeloproliferative syndrome specimens for Vitamin B12 levels. Performed at [facility], [address] [place] [address], [place], [place] [address]    HIV Screen 4th Generation wRfx 07/19/2023 Non Reactive  Non Reactive Final   Performed at [facility], [address], [place], [place] [address]  Admission on 07/13/2023, Discharged on 07/14/2023  Component Date Value Ref Range Status   WBC 07/13/2023 8.4  4.0 - 10.5 K/uL Final   RBC 07/13/2023 3.81 (L)  4.22 - 5.81 MIL/uL Final   Hemoglobin 07/13/2023 12.6 (L)  13.0 - 17.0 g/dL Final   HCT 56/43/3295 37.8 (L)  39.0 - 52.0 % Final   MCV 07/13/2023 99.2  80.0 - 100.0 fL Final   MCH 07/13/2023 33.1  26.0 - 34.0 pg Final   MCHC 07/13/2023 33.3  30.0 - 36.0 g/dL Final   RDW 18/84/1660 13.2  11.5 - 15.5 % Final   Platelets 07/13/2023 418 (H)  150 - 400 K/uL Final   REPEATED TO VERIFY   nRBC 07/13/2023 0.0  0.0 - 0.2 % Final   Neutrophils Relative % 07/13/2023 55  % Final   Neutro Abs 07/13/2023 4.7  1.7 - 7.7 K/uL Final   Lymphocytes Relative 07/13/2023 25  % Final   Lymphs Abs 07/13/2023 2.1  0.7 - 4.0 K/uL Final   Monocytes Relative 07/13/2023 12  % Final   Monocytes Absolute 07/13/2023 1.0  0.1 - 1.0 K/uL Final   Eosinophils Relative 07/13/2023  7  % Final   Eosinophils Absolute 07/13/2023 0.6 (H)  0.0 - 0.5 K/uL Final   Basophils Relative 07/13/2023 1  % Final   Basophils Absolute 07/13/2023 0.1  0.0 - 0.1 K/uL Final   Immature Granulocytes 07/13/2023 0  % Final   Abs Immature Granulocytes 07/13/2023 0.02  0.00 - 0.07 K/uL Final   Performed at [facility], [address] [place], [place] [address]    Sodium 07/13/2023 131 (L)  135 - 145 mmol/L Final   Potassium 07/13/2023 3.9  3.5 - 5.1 mmol/L Final   Chloride 07/13/2023 99  98 - 111 mmol/L Final   CO2 07/13/2023 23  22 - 32 mmol/L Final   Glucose, Bld 07/13/2023 105 (H)  70 - 99 mg/dL Final   Glucose reference range applies only to samples taken after fasting for at least 8 hours.   BUN 07/13/2023 12  6 - 20 mg/dL Final   Creatinine, Ser 07/13/2023 1.06  0.61 - 1.24 mg/dL Final   Calcium 19/14/7829 8.9  8.9 - 10.3 mg/dL Final   Total Protein 56/21/3086 8.1  6.5 - 8.1 g/dL Final   Albumin 57/84/6962 3.3 (L)  3.5 - 5.0 g/dL Final   AST 95/28/4132 41  15 - 41 U/L Final   ALT 07/13/2023 45 (H)  0 - 44 U/L Final   Alkaline Phosphatase 07/13/2023 64  38 - 126 U/L Final   Total Bilirubin 07/13/2023 0.8  0.0 - 1.2 mg/dL Final   GFR, Estimated 07/13/2023 >60  >60 mL/min Final   Comment: (NOTE) Calculated using the CKD-EPI Creatinine Equation (2021)    Anion gap 07/13/2023 9  5 - 15 Final   Performed at [facility], [address] [place], [place] [address]   Alcohol, Ethyl (B) 07/13/2023 <15  <15 mg/dL Final   Comment: Please note change in reference range. (NOTE) For medical purposes only. Performed at [facility], [address], [place], [place] [address]    Opiates 07/13/2023 NONE DETECTED  NONE DETECTED Final   Cocaine 07/13/2023 NONE DETECTED  NONE DETECTED Final   Benzodiazepines 07/13/2023 POSITIVE (A)  NONE DETECTED Final   Amphetamines 07/13/2023 POSITIVE (A)  NONE DETECTED Final   Comment: (NOTE) Trazodone  is metabolized in vivo to several metabolites, including pharmacologically active m-CPP, which is excreted in the urine. Immunoassay screens for amphetamines and MDMA have potential cross-reactivity with these compounds and may provide false positive  results.     Tetrahydrocannabinol 07/13/2023 POSITIVE (A)  NONE DETECTED Final   Barbiturates 07/13/2023 POSITIVE (A)  NONE  DETECTED Final   Comment: (NOTE) DRUG SCREEN FOR MEDICAL PURPOSES ONLY.  IF CONFIRMATION IS NEEDED FOR ANY PURPOSE, NOTIFY LAB WITHIN 5 DAYS.  LOWEST DETECTABLE LIMITS FOR URINE DRUG SCREEN Drug Class                     Cutoff (ng/mL) Amphetamine and metabolites    1000 Barbiturate and metabolites    200 Benzodiazepine                 200 Opiates and metabolites        300 Cocaine and metabolites        300 THC                            50 Performed at [facility], [address] [place], [place] [address]  Admission on 07/11/2023, Discharged on 07/12/2023  Component Date Value Ref Range Status   Sodium 07/11/2023 134 (L)  135 - 145 mmol/L Final   Potassium 07/11/2023 3.7  3.5 - 5.1 mmol/L Final   Chloride 07/11/2023 100  98 - 111 mmol/L Final   CO2 07/11/2023 22  22 - 32 mmol/L Final   Glucose, Bld 07/11/2023 106 (H)  70 - 99 mg/dL Final   Glucose reference range applies only to samples taken after fasting for at least 8 hours.   BUN 07/11/2023 10  6 - 20 mg/dL Final   Creatinine, Ser 07/11/2023 0.90  0.61 - 1.24 mg/dL Final   Calcium 16/12/9602 9.2  8.9 - 10.3 mg/dL Final   Total Protein 54/11/8117 8.9 (H)  6.5 - 8.1 g/dL Final   Albumin 14/78/2956 3.5  3.5 - 5.0 g/dL Final   AST 21/30/8657 48 (H)  15 - 41 U/L Final   ALT 07/11/2023 55 (H)  0 - 44 U/L Final   Alkaline Phosphatase 07/11/2023 59  38 - 126 U/L Final   Total Bilirubin 07/11/2023 0.6  0.0 - 1.2 mg/dL Final   GFR, Estimated 07/11/2023 >60  >60 mL/min Final   Comment: (NOTE) Calculated using the CKD-EPI Creatinine Equation (2021)    Anion gap 07/11/2023 12  5 - 15 Final   Performed at [facility], [address], [place], [place] [address]   Alcohol, Ethyl (B) 07/11/2023 <15  <15 mg/dL Final   Comment: Please note change in reference range. (NOTE) For medical purposes only. Performed at [facility], [address] [place], [place] [address]    WBC 07/11/2023 9.3  4.0  - 10.5 K/uL Final   RBC 07/11/2023 3.97 (L)  4.22 - 5.81 MIL/uL Final   Hemoglobin 07/11/2023 13.1  13.0 - 17.0 g/dL Final   HCT 41/32/4401 39.3  39.0 - 52.0 % Final   MCV 07/11/2023 99.0  80.0 - 100.0 fL Final   MCH 07/11/2023 33.0  26.0 - 34.0 pg Final   MCHC 07/11/2023 33.3  30.0 - 36.0 g/dL Final   RDW 02/72/5366 13.2  11.5 - 15.5 % Final   Platelets 07/11/2023 558 (H)  150 - 400 K/uL Final   nRBC 07/11/2023 0.0  0.0 - 0.2 % Final   Neutrophils Relative % 07/11/2023 52  % Final   Neutro Abs 07/11/2023 4.8  1.7 - 7.7 K/uL Final   Lymphocytes Relative 07/11/2023 29  % Final   Lymphs Abs 07/11/2023 2.7  0.7 - 4.0 K/uL Final   Monocytes Relative 07/11/2023 13  % Final   Monocytes Absolute 07/11/2023 1.2 (H)  0.1 - 1.0 K/uL Final   Eosinophils Relative 07/11/2023 5  % Final   Eosinophils Absolute 07/11/2023 0.5  0.0 - 0.5 K/uL Final   Basophils Relative 07/11/2023 1  % Final   Basophils Absolute 07/11/2023 0.1  0.0 - 0.1 K/uL Final   Immature Granulocytes 07/11/2023 0  % Final   Abs Immature Granulocytes 07/11/2023 0.02  0.00 - 0.07 K/uL Final   Performed at [facility], [address] [place], [place] [address]   Salicylate Lvl 07/11/2023 <7.0 (L)  7.0 - 30.0 mg/dL Final   Performed at [facility], [address], [place], [place] [address]   Acetaminophen  (Tylenol ), Serum 07/11/2023 <10 (L)  10 - 30 ug/mL Final   Comment: (NOTE) Therapeutic concentrations vary significantly. A range of 10-30 ug/mL  may be an effective concentration for  many patients. However, some  are best treated at concentrations outside of this range. Acetaminophen  concentrations >150 ug/mL at 4 hours after ingestion  and >50 ug/mL at 12 hours after ingestion are often associated with  toxic reactions.  Performed at [facility], [address] [place], [place] [address]    Total CK 07/11/2023 223  49 - 397 U/L Final   Performed at [facility], [address], [place],  [place] [address]   Ammonia 07/11/2023 56 (H)  9 - 35 umol/L Final   Performed at [facility], [address] [place], [place] [address]  No results displayed because visit has over 200 results.      Allergies: Penicillins  Medications:  Facility Ordered Medications  Medication   LORazepam  (ATIVAN ) tablet 1 mg   PTA Medications  Medication Sig   haloperidol  (HALDOL ) 5 MG tablet Take 1 tablet (5 mg total) by mouth 2 (two) times daily. (Patient not taking: Reported on 08/06/2023)   traZODone  (DESYREL ) 50 MG tablet Take 1 tablet (50 mg total) by mouth at bedtime as needed for sleep. (Patient not taking: Reported on 08/06/2023)   thiamine  (VITAMIN B-1) 100 MG tablet Take 1 tablet (100 mg total) by mouth daily. (Patient not taking: Reported on 08/06/2023)   hydrOXYzine  (ATARAX ) 25 MG tablet Take 1 tablet (25 mg total) by mouth 3 (three) times daily as needed for anxiety. (Patient not taking: Reported on 08/06/2023)      Medical Decision Making  Admit to Observation unit. To be transferred to [facility] when bed available.  Initiate CIWA protocol.  EKG Maalox 30 ml PO Q 4 PRN Milk of Magnesia 30 ml PO Daily PRN Haldol  5 mg PO BID Benztropine  0.5 mg PO BID Agitation protocol ordered.   Labs: UDS    Recommendations  Based on my evaluation the patient does not appear to have an emergency medical condition.  Elston Halsted, NP 09/06/23  2:48 PM

## 2023-09-06 NOTE — ED Provider Notes (Signed)
 Behavioral Health Urgent Care Medical Screening Exam  Patient Name: Jerry [name] MRN: [id number] Date of Evaluation: 09/06/23 Chief Complaint:   Diagnosis:  Final diagnoses:  None    History of Present illness: Jerry [name] is a 48 y.o. male.   Per chart review:   Jerry [name] is a 48 y.o. male who presented to the ED on  09/04/2023  2:56 PM via EMS for Psychosis, AH.with psychiatric diagnoses of alcohol abuse, schizophrenia, history of Dts, polysubstance abuse, GAD tobacco abuse, malingering, PTSD, tardive dyskinesia, and has a past medical history of  Seizure Disorder. He presented to the ED with AH and shakes/tremors on his hand, and diaphoretic.   On 09/05/2023, Patient was transferred to the [facility] as a direct admit from [facility]  for continuous observation overnight and re-eval in the am.  Patient was evaluated by attending provider at [facility]  and admission to Observation unit was initiated. However, staff unable to draw labs as expected. Chart review indicated abnormal sodium level  (128) which was not addressed prior to transfer to [facility]. Patient was transferred back to ED for medical clearance.    Yesterday, he presents to this facility from [facility], with chief complaint of substance use problem and requesting treatment. He was evaluated:     48 year-old male who appears older than his stated age. He appears tired and restless. He is disheveled and malodorous, with poor hygiene.  Alert and oriented x 4. He has poor eye contact. His speech is slurred, with decreased volume. His mood is  and depressed and anxious. His thought process is coherent. He denies SI/HI/AVH but admits to having hallucinations yesterday.  Patient reports his main problem is substance use: admits to drinking vodka every day, about a fifth, and using crack/cocaine daily.  Currently BAL=0. Patient keeps saying I need treatment for my substance abuse, I am not suicidal..not hearing voices. He then refuses to participate  in this assessment and asks for food stating I did not eat any thing this morning, can I at least have something to eat?Aaron Aas  Food and beverages  provided.     Provider attempts to discuss about available substance abuse outpatient services and patient states I am not going to do no outpatient services, just get me out of here, and give me a bus pass. Going straight back to the street. Patient leaves the room stating I don't need no resources from you, them doctors get that much  money for nothing.   Today, patient returns to [facility] with chief complaint of I am shaking badly, I nee treatment. He admits that upon departure from this facility yesterday, he used alcohol again (liquor). He reports he has been experiencing symptoms including abdominal cramps, dry mouth, nausea and increased anxiety. Reports he wants to stay away from alcohol and outpatient services are not the best option for now. Reports he wants to go  to a rehab program once stabilized. Patient denies SI but reports auditory hallucinations. He denies homicidal ideations. Reports his appetite is fair. Has not slept since yesterday.   Substance abuse treatment recommended. Patient will be admitted to [facility]. CIWA protocol initiated.    Flowsheet Row ED from 09/06/2023 in [facility] ED from 09/05/2023 in [facility] ED from 09/04/2023 in [facility] Emergency Department at [facility]  C-SSRS RISK CATEGORY High Risk Error: Q3, 4, or 5 should not be populated when Q2 is No No Risk  Psychiatric Specialty Exam  Presentation  General Appearance:Disheveled  Eye Contact:Poor  Speech:Garbled; Slurred  Speech Volume:Decreased  Handedness:Right   Mood and Affect  Mood: Anxious; Depressed  Affect: Depressed   Thought Process  Thought Processes: Coherent  Descriptions of Associations:Intact  Orientation:Full (Time, Place and Person)  Thought Content:WDL   Diagnosis of Schizophrenia or Schizoaffective disorder in past: Yes  Duration of Psychotic Symptoms: Greater than six months  Hallucinations:Auditory too many voices in my mind  Ideas of Reference:None  Suicidal Thoughts:No  Homicidal Thoughts:No   Sensorium  Memory: Immediate Fair; Recent Fair; Remote Poor  Judgment: Poor  Insight: Fair   Chartered certified accountant: Fair  Attention Span: Fair  Recall: [employer] of Knowledge: Fair  Language: Fair   Psychomotor Activity  Psychomotor Activity: Restlessness   Assets  Assets: Manufacturing systems engineer; Desire for Improvement   Sleep  Sleep: Poor  Number of hours:  8   Physical Exam: Physical Exam Vitals and nursing note reviewed.  HENT:     Head: Normocephalic and atraumatic.     Right Ear: Tympanic membrane normal.     Left Ear: Tympanic membrane normal.     Nose: Nose normal.     Mouth/Throat:     Mouth: Mucous membranes are dry.   Eyes:     Extraocular Movements: Extraocular movements intact.     Pupils: Pupils are equal, round, and reactive to light.   Pulmonary:     Effort: Pulmonary effort is normal.   Musculoskeletal:        General: Normal range of motion.   Neurological:     General: No focal deficit present.     Mental Status: He is alert and oriented to person, place, and time.    Review of Systems  Constitutional: Negative.   HENT: Negative.    Eyes: Negative.   Respiratory: Negative.    Cardiovascular: Negative.   Gastrointestinal: Negative.   Genitourinary: Negative.   Musculoskeletal: Negative.   Skin: Negative.    There were no vitals taken for this visit. There is no height or weight on file to calculate BMI.  Musculoskeletal: Strength & Muscle Tone: within normal limits Gait & Station: normal Patient leans: N/A   [facility] MSE Discharge Disposition for Follow up and Recommendations: Patient meet criteria for [facility] services. Desires to go to rehab once  stabilized.    Elston Halsted, NP 09/06/2023, 2:20 PM
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
## 2023-09-06 NOTE — Progress Notes (Signed)
   09/06/23 1417  [place] Suicide Severity Rating Scale  1. In the past month -  Have you wished you were dead or wished you could go to sleep and not wake up? Yes  2. In the past month - Have you actually had any thoughts of killing yourself? Yes  3. In the past month - Have you been thinking about how you might kill yourself? Yes  4. In the past month - Have you had these thoughts and had some intention of acting on them? Yes  5. In the past month - Have you started to work out or worked out the details of how to kill yourself? Do you intend to carry out this plan? Yes  6. Have you ever done anything, started to do anything, or prepared to do anything to end your life? Yes  7. Was this within the past three months? No  C-SSRS RISK CATEGORY High Risk  Patient location: [facility] Urgent Care/Facility Based Crisis Center  [facility] Urgent Care/Facility Based Crisis Center Suicide Precautions Interventions  [facility] Suicide Precautions Interventions High Risk Interventions

## 2023-09-06 NOTE — ED Notes (Addendum)
 Pt took shower, no falls reported. Pt provided with dinner and scheduled medications. Medications reviewed with pt, questions denied. Pt reports stomach pain that started this morning, says its withdrawn related. Pt denies intent to harm self or others- verbal contract for safety provided, pt says he has had passive si thoughts but no plan to harm self, and also occasionally hears voices.  Pt currently resting in bed, denied other needs at this time.

## 2023-09-07 ENCOUNTER — Other Ambulatory Visit (HOSPITAL_COMMUNITY)
Admission: EM | Admit: 2023-09-07 | Discharge: 2023-09-11 | Disposition: A | Discharge status: Home / Self Care | Payer: MEDICAID | Attending: Psychiatry | Admitting: Psychiatry

## 2023-09-07 DIAGNOSIS — F209 Schizophrenia, unspecified: Secondary | ICD-10-CM | POA: Diagnosis not present

## 2023-09-07 DIAGNOSIS — F1010 Alcohol abuse, uncomplicated: Secondary | ICD-10-CM | POA: Diagnosis not present

## 2023-09-07 DIAGNOSIS — Z5900 Homelessness unspecified: Secondary | ICD-10-CM | POA: Diagnosis not present

## 2023-09-07 DIAGNOSIS — R443 Hallucinations, unspecified: Secondary | ICD-10-CM

## 2023-09-07 DIAGNOSIS — R251 Tremor, unspecified: Secondary | ICD-10-CM | POA: Diagnosis not present

## 2023-09-07 DIAGNOSIS — F1910 Other psychoactive substance abuse, uncomplicated: Secondary | ICD-10-CM | POA: Insufficient documentation

## 2023-09-07 DIAGNOSIS — F1090 Alcohol use, unspecified, uncomplicated: Secondary | ICD-10-CM | POA: Diagnosis present

## 2023-09-07 MED ORDER — DIPHENHYDRAMINE HCL 50 MG/ML IJ SOLN: 50.0000 mg | Freq: Three times a day (TID) | INTRAMUSCULAR | Status: DC | PRN

## 2023-09-07 MED ORDER — HALOPERIDOL LACTATE 5 MG/ML IJ SOLN
10.0000 mg | Freq: Three times a day (TID) | INTRAMUSCULAR | Status: DC | PRN
Start: 2023-09-07 — End: 2023-09-11

## 2023-09-07 MED ORDER — HALOPERIDOL LACTATE 5 MG/ML IJ SOLN: 5.0000 mg | Freq: Three times a day (TID) | INTRAMUSCULAR | Status: DC | PRN

## 2023-09-07 MED ORDER — LORAZEPAM 1 MG PO TABS
1.0000 mg | ORAL_TABLET | Freq: Three times a day (TID) | ORAL | Status: DC
Start: 2023-09-08 — End: 2023-09-07

## 2023-09-07 MED ORDER — LORAZEPAM 1 MG PO TABS
1.0000 mg | ORAL_TABLET | Freq: Two times a day (BID) | ORAL | Status: AC
  Administered 2023-09-09 (×2): 1 mg via ORAL
  Filled 2023-09-07 (×2): qty 1

## 2023-09-07 MED ORDER — ONDANSETRON 4 MG PO TBDP
4.0000 mg | ORAL_TABLET | Freq: Four times a day (QID) | ORAL | Status: AC | PRN
  Administered 2023-09-08: 4 mg via ORAL
  Filled 2023-09-07 (×2): qty 1

## 2023-09-07 MED ORDER — HYDROXYZINE HCL 25 MG PO TABS
25.0000 mg | ORAL_TABLET | Freq: Three times a day (TID) | ORAL | Status: DC | PRN
Start: 2023-09-07 — End: 2023-09-11
  Administered 2023-09-07 – 2023-09-10 (×3): 25 mg via ORAL
  Filled 2023-09-07: qty 1
  Filled 2023-09-07: qty 10
  Filled 2023-09-07 (×2): qty 1

## 2023-09-07 MED ORDER — LORAZEPAM 1 MG PO TABS: 1.0000 mg | ORAL_TABLET | Freq: Two times a day (BID) | ORAL | Status: DC

## 2023-09-07 MED ORDER — HALOPERIDOL 5 MG PO TABS: 5.0000 mg | ORAL_TABLET | Freq: Three times a day (TID) | ORAL | Status: DC | PRN

## 2023-09-07 MED ORDER — DIPHENHYDRAMINE HCL 50 MG PO CAPS
50.0000 mg | ORAL_CAPSULE | Freq: Three times a day (TID) | ORAL | Status: DC | PRN
Start: 2023-09-07 — End: 2023-09-11

## 2023-09-07 MED ORDER — LORAZEPAM 1 MG PO TABS
1.0000 mg | ORAL_TABLET | Freq: Three times a day (TID) | ORAL | Status: AC
  Administered 2023-09-08 (×3): 1 mg via ORAL
  Filled 2023-09-07 (×3): qty 1

## 2023-09-07 MED ORDER — LORAZEPAM 1 MG PO TABS
1.0000 mg | ORAL_TABLET | Freq: Every day | ORAL | Status: DC
Start: 2023-09-10 — End: 2023-09-07

## 2023-09-07 MED ORDER — ACETAMINOPHEN 325 MG PO TABS
650.0000 mg | ORAL_TABLET | Freq: Four times a day (QID) | ORAL | Status: DC | PRN
  Administered 2023-09-08: 650 mg via ORAL
  Filled 2023-09-07: qty 2

## 2023-09-07 MED ORDER — LORAZEPAM 1 MG PO TABS
1.0000 mg | ORAL_TABLET | Freq: Four times a day (QID) | ORAL | Status: AC | PRN
  Administered 2023-09-07: 1 mg via ORAL
  Filled 2023-09-07: qty 1

## 2023-09-07 MED ORDER — LORAZEPAM 1 MG PO TABS
1.0000 mg | ORAL_TABLET | Freq: Every day | ORAL | Status: AC
  Administered 2023-09-10: 1 mg via ORAL
  Filled 2023-09-07: qty 1

## 2023-09-07 MED ORDER — LORAZEPAM 1 MG PO TABS
1.0000 mg | ORAL_TABLET | Freq: Four times a day (QID) | ORAL | Status: AC
  Administered 2023-09-07 (×2): 1 mg via ORAL
  Filled 2023-09-07 (×2): qty 1

## 2023-09-07 MED ORDER — ADULT MULTIVITAMIN W/MINERALS CH
1.0000 | ORAL_TABLET | Freq: Every day | ORAL | Status: DC
  Administered 2023-09-08 – 2023-09-11 (×4): 1 via ORAL
  Filled 2023-09-07 (×4): qty 1

## 2023-09-07 MED ORDER — HALOPERIDOL 5 MG PO TABS
5.0000 mg | ORAL_TABLET | Freq: Two times a day (BID) | ORAL | Status: DC
  Administered 2023-09-07 – 2023-09-11 (×8): 5 mg via ORAL
  Filled 2023-09-07 (×2): qty 1
  Filled 2023-09-07: qty 14
  Filled 2023-09-07 (×6): qty 1

## 2023-09-07 MED ORDER — LOPERAMIDE HCL 2 MG PO CAPS: 2.0000 mg | ORAL_CAPSULE | ORAL | Status: AC | PRN

## 2023-09-07 MED ORDER — MAGNESIUM HYDROXIDE 400 MG/5ML PO SUSP: 30.0000 mL | Freq: Every day | ORAL | Status: DC | PRN

## 2023-09-07 MED ORDER — LORAZEPAM 2 MG/ML IJ SOLN
2.0000 mg | Freq: Three times a day (TID) | INTRAMUSCULAR | Status: DC | PRN
Start: 2023-09-07 — End: 2023-09-11

## 2023-09-07 MED ORDER — ALUM & MAG HYDROXIDE-SIMETH 200-200-20 MG/5ML PO SUSP: 30.0000 mL | ORAL | Status: DC | PRN

## 2023-09-07 MED ORDER — LORAZEPAM 2 MG/ML IJ SOLN: 2.0000 mg | Freq: Three times a day (TID) | INTRAMUSCULAR | Status: DC | PRN

## 2023-09-07 MED ORDER — LORAZEPAM 1 MG PO TABS
1.0000 mg | ORAL_TABLET | Freq: Four times a day (QID) | ORAL | Status: DC
Start: 2023-09-07 — End: 2023-09-07
  Administered 2023-09-07: 1 mg via ORAL
  Filled 2023-09-07: qty 1

## 2023-09-07 MED ORDER — THIAMINE MONONITRATE 100 MG PO TABS
100.0000 mg | ORAL_TABLET | Freq: Every day | ORAL | Status: DC
  Administered 2023-09-08 – 2023-09-11 (×4): 100 mg via ORAL
  Filled 2023-09-07 (×4): qty 1

## 2023-09-07 MED ORDER — TRAZODONE HCL 50 MG PO TABS
50.0000 mg | ORAL_TABLET | Freq: Every evening | ORAL | Status: DC | PRN
Start: 2023-09-07 — End: 2023-09-11
  Administered 2023-09-07 – 2023-09-10 (×3): 50 mg via ORAL
  Filled 2023-09-07 (×3): qty 1
  Filled 2023-09-07: qty 7

## 2023-09-07 NOTE — ED Notes (Signed)
 Patient resting quietly in bed with eyes closed. Unlabored breathing. NAD noted. Facility protocol safety checks in place. Pt is currently safe on the unit.

## 2023-09-07 NOTE — ED Notes (Signed)
 Patient transferred from Operating Room [facility] to [facility] requesting assistance in detox. Calm, cooperative throughout interview process. Oriented to unit. Meal and drink offered. Patient alert & oriented x4. Denies intent to harm self or others when asked. Denies A/VH. Patient denies any physical complaints when asked. No acute distress noted. It is patient's 40th birthday today, patient states this isn't where I wanted to be for my birthday but it'll be alright. Support and encouragement provided. Routine safety checks conducted per facility protocol. Encouraged patient to notify staff if any thoughts of harm towards self or others arise. Patient verbalizes understanding and agreement.

## 2023-09-07 NOTE — ED Notes (Signed)
Patient observed resting quietly, eyes closed. Respirations equal and unlabored. Will continue to monitor for safety.  

## 2023-09-07 NOTE — ED Notes (Signed)
 Patient is in the bedroom calm and sleeping. NAD Will monitor for safety.

## 2023-09-07 NOTE — ED Notes (Signed)
 Patient eating lunch.

## 2023-09-07 NOTE — ED Notes (Signed)
Patient discharged to [facility]

## 2023-09-07 NOTE — [facility] Group Notes (Signed)
 SPIRITUALITY GROUP NOTE  Spirituality group facilitated by Wilkie Aye, MDiv, BCC.  Group Description: Group focused on topic of hope. Patients participated in facilitated discussion around topic, connecting with one another around experiences and definitions for hope. Group members engaged with visual explorer photos, reflecting on what hope looks like for them today. Group engaged in discussion around how their definitions of hope are present today in hospital.  Modalities: Psycho-social ed, Adlerian, Narrative, MI  Patient Progress: DID NOT ATTEND

## 2023-09-07 NOTE — ED Notes (Signed)
 Patient resting with eyes closed in no apparent acute distress. Respirations even and unlabored. Environment secured. Safety checks in place according to facility policy.

## 2023-09-07 NOTE — Group Note (Signed)
 Group Topic: Healthy Self Image and Positive Change  Group Date: 09/07/2023 Start Time: 1610 End Time: 2000 Facilitators: Alvino Joseph, NT  Department: [facility]  Number of Participants: 9  Group Focus: goals/reality orientation Treatment Modality:  Individual Therapy Interventions utilized were group exercise Purpose: enhance coping skills  Name: Jerry [name] Date of Birth: 1975/07/12  MR: [id number]    Level of Participation: active Quality of Participation: cooperative Interactions with others: gave feedback Mood/Affect: appropriate Triggers (if applicable): n/a Cognition: coherent/clear Progress: None Response: goal-make my voices go away, take the medicine for it, when they give it to me, and ask nurse for help Plan: follow-up needed  Patients Problems:  Patient Active Problem List   Diagnosis Date Noted   Alcohol use disorder 09/07/2023   Cocaine use disorder, moderate, dependence (HCC) 09/04/2023   Alcohol use disorder, moderate, dependence (HCC) 09/04/2023   Protein-calorie malnutrition, severe 07/03/2023   Left arm cellulitis 07/01/2023   Jaw pain 11/29/2022   Need for influenza vaccination 11/29/2022   Erectile dysfunction 11/29/2022   Hyponatremia 11/29/2022   History of hepatitis C virus infection 11/29/2022   Tardive dyskinesia 10/24/2022   Generalized anxiety disorder 10/24/2022   PTSD (post-traumatic stress disorder) 10/24/2022   Healthcare maintenance 10/24/2022   Alcohol withdrawal (HCC) 07/22/2022   Schizoaffective disorder, bipolar type (HCC) 07/09/2022   Schizophrenia (HCC) 07/07/2022   Auditory hallucination    Malingering 09/07/2020   Homelessness 09/07/2020   Major depressive disorder, recurrent severe without psychotic features (HCC) 07/17/2020   Schizophrenia spectrum disorder with psychotic disorder type not yet determined (HCC) 07/17/2020   Methamphetamine abuse (HCC) 07/17/2020   Marijuana abuse  07/17/2020   Cocaine abuse (HCC) 07/17/2020   Anxiety and depression 06/25/2014   Family history of diabetes mellitus (DM) 06/25/2014   Family history of thyroid  disease 06/25/2014   Tobacco use disorder 06/25/2014   Alcohol abuse 05/18/2013   Polysubstance abuse (HCC) 05/18/2013   Depression, unspecified 05/18/2013

## 2023-09-07 NOTE — ED Provider Notes (Signed)
 Behavioral Health Progress Note  Date and Time: 09/07/2023 9:48 AM Name: Jerry [name] MRN:  [id number]  Subjective:  Jerry [name] is a 48 year old with a PPHx of schizophrenia and AUD who presents to the [facility] for detox and rehabilitation services.  Patient seen in the milieu in morning resting in bed, no acute distress. He reports feeling slightly better but still tremulous like yesterday. He reports drinking 1/5 vodka daily, most recently on 6/18. He also has been using crack cocaine daily. He states this morning, he is hearing AH of voices telling him to kill himself but he denies wanting to act on those voices. He currently denies SI and HI. He states that he wants to detox and is interested in going to residential rehab afterwards. I discussed that the [facility] is at capacity at this time and when a bed is available that he can be transferred.   Diagnosis:  Final diagnoses:  Polysubstance abuse (HCC)  Hallucinations    Total Time spent with patient: 20 minutes  Past Psychiatric History:  Dx: schizophrenia, AUD, stimulant use d/o cocaine type Rx: per dc summary from [facility] admission on 06/2023 Haldol  5 mg BID, Atarax  PRN, thiamine  daily, trazodone  PRN 6 IP psych hospitalizations in 2025 for paranoia and alcohol use History of w/d seizures, reporting most recently 3 years ago History of DT, reporting most recently one year ago Denies previous suicide attempts  Past Medical History: None reported  Family Psychiatric  History:  Multiple Members- EtOH and Polysubstance Abuse No Known Diagnosis' or Suicides  Social History: Patient is unhoused and unemployed  Additional Social History:    Pain Medications: SEE MAR Prescriptions: SEE MAR Over the Counter: see MAR History of alcohol / drug use?: Yes Longest period of sobriety (when/how long): 3 yrs 2018-2020 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, Armed forces operational officer, Personal relationships, Work / Programmer, multimedia Withdrawal Symptoms: Agitation, Tingling,  Patient aware of relationship between substance abuse and physical/medical complications, Fever / Chills, Tremors, Sweats, Irritability, Weakness, Blackouts, Diarrhea, Nausea / Vomiting (Currently reporting withdrawal with trembling, nausea, naxiety) Name of Substance 1: alcohol 1 - Age of First Use: teen 1 - Amount (size/oz): unknown 1 - Frequency: was daily but pt stated he has not had any alcohol in over 24 hours 1 - Duration: ongoing 1 - Last Use / Amount: 09/05/23 1 - Method of Aquiring: purchase 1- Route of Use: oral Name of Substance 2: Hx of meth use 2 - Age of First Use: unknown 2 - Amount (size/oz): unknown 2 - Frequency: stated he stopped a few weeks ago 2 - Duration: stated he stopped a few weeks ago 2 - Last Use / Amount: unknown 2 - Method of Aquiring: unknown 2 - Route of Substance Use: unknown Name of Substance 3: Hx of cannabis use 3 - Age of First Use: teen 3 - Amount (size/oz): unknown 3 - Frequency: daily until a few weeks ago per pt 3 - Duration: stated he stopped a few weeks ago 3 - Last Use / Amount: unknown 3 - Method of Aquiring: unknown 3 - Route of Substance Use: smoke              Current Medications:  Current Facility-Administered Medications  Medication Dose Route Frequency Provider Last Rate Last Admin   acetaminophen  (TYLENOL ) tablet 650 mg  650 mg Oral Q6H PRN Gilman Lade, NP   650 mg at 09/06/23 2127   alum & mag hydroxide-simeth (MAALOX/MYLANTA) 200-200-20 MG/5ML suspension 30 mL  30 mL  Oral Q4H PRN Byungura, Veronique M, NP       haloperidol  (HALDOL ) tablet 5 mg  5 mg Oral TID PRN Gilman Lade, NP       And   diphenhydrAMINE  (BENADRYL ) capsule 50 mg  50 mg Oral TID PRN Gilman Lade, NP       haloperidol  lactate (HALDOL ) injection 5 mg  5 mg Intramuscular TID PRN Gilman Lade, NP       And   diphenhydrAMINE  (BENADRYL ) injection 50 mg  50 mg Intramuscular TID PRN Gilman Lade, NP       And    LORazepam  (ATIVAN ) injection 2 mg  2 mg Intramuscular TID PRN Gilman Lade, NP       haloperidol  lactate (HALDOL ) injection 10 mg  10 mg Intramuscular TID PRN Gilman Lade, NP       And   diphenhydrAMINE  (BENADRYL ) injection 50 mg  50 mg Intramuscular TID PRN Gilman Lade, NP       And   LORazepam  (ATIVAN ) injection 2 mg  2 mg Intramuscular TID PRN Byungura, Veronique M, NP       haloperidol  (HALDOL ) tablet 5 mg  5 mg Oral BID Lorrene Rosser, Veronique M, NP   5 mg at 09/07/23 1610   hydrOXYzine  (ATARAX ) tablet 25 mg  25 mg Oral Q6H PRN Gilman Lade, NP       loperamide  (IMODIUM ) capsule 2-4 mg  2-4 mg Oral PRN Gilman Lade, NP       LORazepam  (ATIVAN ) tablet 1 mg  1 mg Oral Q6H PRN Gilman Lade, NP   1 mg at 09/07/23 0846   LORazepam  (ATIVAN ) tablet 1 mg  1 mg Oral QID Gilberto Streck B, MD       Followed by   Cecily Cohen ON 09/08/2023] LORazepam  (ATIVAN ) tablet 1 mg  1 mg Oral TID Milica Gully B, MD       Followed by   Cecily Cohen ON 09/09/2023] LORazepam  (ATIVAN ) tablet 1 mg  1 mg Oral BID Kivon Aprea B, MD       Followed by   Cecily Cohen ON 09/10/2023] LORazepam  (ATIVAN ) tablet 1 mg  1 mg Oral Daily Lowry Bala B, MD       magnesium  hydroxide (MILK OF MAGNESIA) suspension 30 mL  30 mL Oral Daily PRN Lorrene Rosser, Veronique M, NP       multivitamin with minerals tablet 1 tablet  1 tablet Oral Daily Byungura, Veronique M, NP   1 tablet at 09/07/23 0846   ondansetron  (ZOFRAN -ODT) disintegrating tablet 4 mg  4 mg Oral Q6H PRN Gilman Lade, NP   4 mg at 09/06/23 2128   thiamine  (VITAMIN B1) tablet 100 mg  100 mg Oral Daily Byungura, Veronique M, NP   100 mg at 09/07/23 9604   traZODone  (DESYREL ) tablet 50 mg  50 mg Oral QHS PRN Gilman Lade, NP       Current Outpatient Medications  Medication Sig Dispense Refill   haloperidol  (HALDOL ) 5 MG tablet Take 1 tablet (5 mg total) by mouth 2 (two) times daily. (Patient not taking: Reported on  08/06/2023) 60 tablet 0   hydrOXYzine  (ATARAX ) 25 MG tablet Take 1 tablet (25 mg total) by mouth 3 (three) times daily as needed for anxiety. (Patient not taking: Reported on 08/06/2023) 30 tablet 0   thiamine  (VITAMIN B-1) 100 MG tablet Take 1 tablet (100 mg total) by mouth daily. (Patient not taking: Reported on 08/06/2023) 30 tablet  0   traZODone  (DESYREL ) 50 MG tablet Take 1 tablet (50 mg total) by mouth at bedtime as needed for sleep. (Patient not taking: Reported on 08/06/2023) 30 tablet 0    Labs  Lab Results:  Admission on 09/06/2023  Component Date Value Ref Range Status   POC Amphetamine UR 09/06/2023 None Detected  NONE DETECTED (Cut Off Level 1000 ng/mL) Final   POC Secobarbital (BAR) 09/06/2023 None Detected  NONE DETECTED (Cut Off Level 300 ng/mL) Final   POC Buprenorphine (BUP) 09/06/2023 None Detected  NONE DETECTED (Cut Off Level 10 ng/mL) Final   POC Oxazepam (BZO) 09/06/2023 Positive (A)  NONE DETECTED (Cut Off Level 300 ng/mL) Final   POC Cocaine UR 09/06/2023 Positive (A)  NONE DETECTED (Cut Off Level 300 ng/mL) Final   POC Methamphetamine UR 09/06/2023 None Detected  NONE DETECTED (Cut Off Level 1000 ng/mL) Final   POC Morphine 09/06/2023 None Detected  NONE DETECTED (Cut Off Level 300 ng/mL) Final   POC Methadone UR 09/06/2023 None Detected  NONE DETECTED (Cut Off Level 300 ng/mL) Final   POC Oxycodone  UR 09/06/2023 None Detected  NONE DETECTED (Cut Off Level 100 ng/mL) Final   POC Marijuana UR 09/06/2023 Positive (A)  NONE DETECTED (Cut Off Level 50 ng/mL) Final  Admission on 09/05/2023, Discharged on 09/05/2023  Component Date Value Ref Range Status   Sodium 09/05/2023 134 (L)  135 - 145 mmol/L Final   Potassium 09/05/2023 3.2 (L)  3.5 - 5.1 mmol/L Final   Chloride 09/05/2023 100  98 - 111 mmol/L Final   CO2 09/05/2023 24  22 - 32 mmol/L Final   Glucose, Bld 09/05/2023 117 (H)  70 - 99 mg/dL Final   Glucose reference range applies only to samples taken after fasting  for at least 8 hours.   BUN 09/05/2023 7  6 - 20 mg/dL Final   Creatinine, Ser 09/05/2023 0.77  0.61 - 1.24 mg/dL Final   Calcium 16/12/9602 9.0  8.9 - 10.3 mg/dL Final   Total Protein 54/11/8117 8.2 (H)  6.5 - 8.1 g/dL Final   Albumin 14/78/2956 3.2 (L)  3.5 - 5.0 g/dL Final   AST 21/30/8657 79 (H)  15 - 41 U/L Final   ALT 09/05/2023 51 (H)  0 - 44 U/L Final   Alkaline Phosphatase 09/05/2023 68  38 - 126 U/L Final   Total Bilirubin 09/05/2023 1.1  0.0 - 1.2 mg/dL Final   GFR, Estimated 09/05/2023 >60  >60 mL/min Final   Comment: (NOTE) Calculated using the CKD-EPI Creatinine Equation (2021)    Anion gap 09/05/2023 10  5 - 15 Final   Performed at [facility], [address] [place], [place] [address]   WBC 09/05/2023 9.6  4.0 - 10.5 K/uL Final   RBC 09/05/2023 4.24  4.22 - 5.81 MIL/uL Final   Hemoglobin 09/05/2023 13.9  13.0 - 17.0 g/dL Final   HCT 29/52/8413 41.2  39.0 - 52.0 % Final   MCV 09/05/2023 97.2  80.0 - 100.0 fL Final   MCH 09/05/2023 32.8  26.0 - 34.0 pg Final   MCHC 09/05/2023 33.7  30.0 - 36.0 g/dL Final   RDW 24/40/1027 13.3  11.5 - 15.5 % Final   Platelets 09/05/2023 305  150 - 400 K/uL Final   nRBC 09/05/2023 0.0  0.0 - 0.2 % Final   Performed at [facility], [address], [place], [place] [address]  Admission on 09/04/2023, Discharged on 09/05/2023  Component Date Value  Ref Range Status   Sodium 09/04/2023 128 (L)  135 - 145 mmol/L Final   Potassium 09/04/2023 3.0 (L)  3.5 - 5.1 mmol/L Final   Chloride 09/04/2023 97 (L)  98 - 111 mmol/L Final   CO2 09/04/2023 21 (L)  22 - 32 mmol/L Final   Glucose, Bld 09/04/2023 132 (H)  70 - 99 mg/dL Final   Glucose reference range applies only to samples taken after fasting for at least 8 hours.   BUN 09/04/2023 8  6 - 20 mg/dL Final   Creatinine, Ser 09/04/2023 0.86  0.61 - 1.24 mg/dL Final   Calcium 04/54/0981 8.1 (L)  8.9 - 10.3 mg/dL Final   Total Protein 19/14/7829 7.9  6.5 -  8.1 g/dL Final   Albumin 56/21/3086 3.2 (L)  3.5 - 5.0 g/dL Final   AST 57/84/6962 74 (H)  15 - 41 U/L Final   ALT 09/04/2023 48 (H)  0 - 44 U/L Final   Alkaline Phosphatase 09/04/2023 66  38 - 126 U/L Final   Total Bilirubin 09/04/2023 0.7  0.0 - 1.2 mg/dL Final   GFR, Estimated 09/04/2023 >60  >60 mL/min Final   Comment: (NOTE) Calculated using the CKD-EPI Creatinine Equation (2021)    Anion gap 09/04/2023 10  5 - 15 Final   Performed at [facility], [address], [place], [place] [address]   Alcohol, Ethyl (B) 09/04/2023 <15  <15 mg/dL Final   Comment: (NOTE) For medical purposes only. Performed at [facility], [address] [place], [place] [address]    Opiates 09/04/2023 NONE DETECTED  NONE DETECTED Final   Cocaine 09/04/2023 POSITIVE (A)  NONE DETECTED Final   Benzodiazepines 09/04/2023 NONE DETECTED  NONE DETECTED Final   Amphetamines 09/04/2023 NONE DETECTED  NONE DETECTED Final   Tetrahydrocannabinol 09/04/2023 NONE DETECTED  NONE DETECTED Final   Barbiturates 09/04/2023 NONE DETECTED  NONE DETECTED Final   Comment: (NOTE) DRUG SCREEN FOR MEDICAL PURPOSES ONLY.  IF CONFIRMATION IS NEEDED FOR ANY PURPOSE, NOTIFY LAB WITHIN 5 DAYS.  LOWEST DETECTABLE LIMITS FOR URINE DRUG SCREEN Drug Class                     Cutoff (ng/mL) Amphetamine and metabolites    1000 Barbiturate and metabolites    200 Benzodiazepine                 200 Opiates and metabolites        300 Cocaine and metabolites        300 THC                            50 Performed at [facility], [address], [place], [place] [address]    WBC 09/04/2023 10.4  4.0 - 10.5 K/uL Final   RBC 09/04/2023 4.00 (L)  4.22 - 5.81 MIL/uL Final   Hemoglobin 09/04/2023 13.2  13.0 - 17.0 g/dL Final   HCT 25/36/6440 38.8 (L)  39.0 - 52.0 % Final   MCV 09/04/2023 97.0  80.0 - 100.0 fL Final   MCH 09/04/2023 33.0  26.0 - 34.0 pg Final   MCHC 09/04/2023  34.0  30.0 - 36.0 g/dL Final   RDW 34/74/2595 13.2  11.5 - 15.5 % Final   Platelets 09/04/2023 276  150 - 400 K/uL Final   nRBC 09/04/2023 0.0  0.0 - 0.2 % Final   Neutrophils Relative %  09/04/2023 83  % Final   Neutro Abs 09/04/2023 8.6 (H)  1.7 - 7.7 K/uL Final   Lymphocytes Relative 09/04/2023 7  % Final   Lymphs Abs 09/04/2023 0.7  0.7 - 4.0 K/uL Final   Monocytes Relative 09/04/2023 10  % Final   Monocytes Absolute 09/04/2023 1.1 (H)  0.1 - 1.0 K/uL Final   Eosinophils Relative 09/04/2023 0  % Final   Eosinophils Absolute 09/04/2023 0.0  0.0 - 0.5 K/uL Final   Basophils Relative 09/04/2023 0  % Final   Basophils Absolute 09/04/2023 0.0  0.0 - 0.1 K/uL Final   Immature Granulocytes 09/04/2023 0  % Final   Abs Immature Granulocytes 09/04/2023 0.03  0.00 - 0.07 K/uL Final   Performed at [facility], [address], [place], [place] [address]   Potassium 09/05/2023 3.6  3.5 - 5.1 mmol/L Final   Performed at [facility], [address], [place], [place] [address]  Admission on 08/04/2023, Discharged on 08/07/2023  Component Date Value Ref Range Status   Sodium 08/04/2023 132 (L)  135 - 145 mmol/L Final   Potassium 08/04/2023 4.0  3.5 - 5.1 mmol/L Final   Chloride 08/04/2023 99  98 - 111 mmol/L Final   CO2 08/04/2023 25  22 - 32 mmol/L Final   Glucose, Bld 08/04/2023 100 (H)  70 - 99 mg/dL Final   Glucose reference range applies only to samples taken after fasting for at least 8 hours.   BUN 08/04/2023 10  6 - 20 mg/dL Final   Creatinine, Ser 08/04/2023 0.98  0.61 - 1.24 mg/dL Final   Calcium 30/86/5784 9.6  8.9 - 10.3 mg/dL Final   Total Protein 69/62/9528 9.1 (H)  6.5 - 8.1 g/dL Final   Albumin 41/32/4401 4.1  3.5 - 5.0 g/dL Final   AST 02/72/5366 32  15 - 41 U/L Final   ALT 08/04/2023 23  0 - 44 U/L Final   Alkaline Phosphatase 08/04/2023 59  38 - 126 U/L Final   Total Bilirubin 08/04/2023 1.1  0.0 - 1.2 mg/dL Final   GFR, Estimated  08/04/2023 >60  >60 mL/min Final   Comment: (NOTE) Calculated using the CKD-EPI Creatinine Equation (2021)    Anion gap 08/04/2023 8  5 - 15 Final   Performed at [facility], [address], [place], [place] [address]   Alcohol, Ethyl (B) 08/04/2023 <15  <15 mg/dL Final   Comment: Please note change in reference range. (NOTE) For medical purposes only. Performed at [facility], [address] [place], [place] [address]    Opiates 08/04/2023 NONE DETECTED  NONE DETECTED Final   Cocaine 08/04/2023 POSITIVE (A)  NONE DETECTED Final   Benzodiazepines 08/04/2023 POSITIVE (A)  NONE DETECTED Final   Amphetamines 08/04/2023 POSITIVE (A)  NONE DETECTED Final   Comment: (NOTE) Trazodone  is metabolized in vivo to several metabolites, including pharmacologically active m-CPP, which is excreted in the urine. Immunoassay screens for amphetamines and MDMA have potential cross-reactivity with these compounds and may provide false positive  results.     Tetrahydrocannabinol 08/04/2023 NONE DETECTED  NONE DETECTED Final   Barbiturates 08/04/2023 NONE DETECTED  NONE DETECTED Final   Comment: (NOTE) DRUG SCREEN FOR MEDICAL PURPOSES ONLY.  IF CONFIRMATION IS NEEDED FOR ANY PURPOSE, NOTIFY LAB WITHIN 5 DAYS.  LOWEST DETECTABLE LIMITS FOR URINE DRUG SCREEN Drug Class  Cutoff (ng/mL) Amphetamine and metabolites    1000 Barbiturate and metabolites    200 Benzodiazepine                 200 Opiates and metabolites        300 Cocaine and metabolites        300 THC                            50 Performed at [facility], [address] [place], [place] [address]    WBC 08/04/2023 7.4  4.0 - 10.5 K/uL Final   RBC 08/04/2023 4.07 (L)  4.22 - 5.81 MIL/uL Final   Hemoglobin 08/04/2023 13.5  13.0 - 17.0 g/dL Final   HCT 40/98/1191 40.7  39.0 - 52.0 % Final   MCV 08/04/2023 100.0  80.0 - 100.0 fL Final   MCH 08/04/2023  33.2  26.0 - 34.0 pg Final   MCHC 08/04/2023 33.2  30.0 - 36.0 g/dL Final   RDW 47/82/9562 13.3  11.5 - 15.5 % Final   Platelets 08/04/2023 329  150 - 400 K/uL Final   nRBC 08/04/2023 0.0  0.0 - 0.2 % Final   Neutrophils Relative % 08/04/2023 69  % Final   Neutro Abs 08/04/2023 5.1  1.7 - 7.7 K/uL Final   Lymphocytes Relative 08/04/2023 19  % Final   Lymphs Abs 08/04/2023 1.4  0.7 - 4.0 K/uL Final   Monocytes Relative 08/04/2023 10  % Final   Monocytes Absolute 08/04/2023 0.8  0.1 - 1.0 K/uL Final   Eosinophils Relative 08/04/2023 1  % Final   Eosinophils Absolute 08/04/2023 0.1  0.0 - 0.5 K/uL Final   Basophils Relative 08/04/2023 0  % Final   Basophils Absolute 08/04/2023 0.0  0.0 - 0.1 K/uL Final   Immature Granulocytes 08/04/2023 1  % Final   Abs Immature Granulocytes 08/04/2023 0.04  0.00 - 0.07 K/uL Final   Performed at [facility], [address], [place], [place] [address]  Admission on 07/14/2023, Discharged on 07/19/2023  Component Date Value Ref Range Status   Sodium 07/19/2023 131 (L)  135 - 145 mmol/L Final   Potassium 07/19/2023 4.3  3.5 - 5.1 mmol/L Final   Chloride 07/19/2023 98  98 - 111 mmol/L Final   CO2 07/19/2023 23  22 - 32 mmol/L Final   Glucose, Bld 07/19/2023 104 (H)  70 - 99 mg/dL Final   Glucose reference range applies only to samples taken after fasting for at least 8 hours.   BUN 07/19/2023 16  6 - 20 mg/dL Final   Creatinine, Ser 07/19/2023 0.76  0.61 - 1.24 mg/dL Final   Calcium 57/84/6962 9.2  8.9 - 10.3 mg/dL Final   Total Protein 95/28/4132 8.5 (H)  6.5 - 8.1 g/dL Final   Albumin 44/03/270 3.5  3.5 - 5.0 g/dL Final   AST 53/66/4403 30  15 - 41 U/L Final   ALT 07/19/2023 34  0 - 44 U/L Final   Alkaline Phosphatase 07/19/2023 51  38 - 126 U/L Final   Total Bilirubin 07/19/2023 0.6  0.0 - 1.2 mg/dL Final   GFR, Estimated 07/19/2023 >60  >60 mL/min Final   Comment: (NOTE) Calculated using the CKD-EPI Creatinine Equation (2021)     Anion gap 07/19/2023 10  5 - 15 Final   Performed at [facility], [address] [place], [place] [address]   Hgb A1c  MFr Bld 07/19/2023 4.8  4.8 - 5.6 % Final   Comment: (NOTE) Pre diabetes:          5.7%-6.4%  Diabetes:              >6.4%  Glycemic control for   <7.0% adults with diabetes    Mean Plasma Glucose 07/19/2023 91.06  mg/dL Final   Performed at [facility], [address], [place], [place] [address]   Cholesterol 07/19/2023 168  0 - 200 mg/dL Final   Triglycerides 60/45/4098 120  <150 mg/dL Final   HDL 11/91/4782 42  >40 mg/dL Final   Total CHOL/HDL Ratio 07/19/2023 4.0  RATIO Final   VLDL 07/19/2023 24  0 - 40 mg/dL Final   LDL Cholesterol 07/19/2023 102 (H)  0 - 99 mg/dL Final   Comment:        Total Cholesterol/HDL:CHD Risk Coronary Heart Disease Risk Table                     Men   Women  1/2 Average Risk   3.4   3.3  Average Risk       5.0   4.4  2 X Average Risk   9.6   7.1  3 X Average Risk  23.4   11.0        Use the calculated Patient Ratio above and the CHD Risk Table to determine the patient's CHD Risk.        ATP III CLASSIFICATION (LDL):  <100     mg/dL   Optimal  956-213  mg/dL   Near or Above                    Optimal  130-159  mg/dL   Borderline  086-578  mg/dL   High  >469     mg/dL   Very High Performed at [facility], [address] [place], [place] [address]    RPR Ser Ql 07/19/2023 NON REACTIVE  NON REACTIVE Final   Performed at [facility], [address], [place], [place] [address]   TSH 07/19/2023 1.931  0.350 - 4.500 uIU/mL Final   Comment: Performed by a 3rd Generation assay with a functional sensitivity of <=0.01 uIU/mL. Performed at [facility], [address] [place], [place] [address]    Vit D, 25-Hydroxy 07/19/2023 32.02  30 - 100 ng/mL Final   Comment: (NOTE) Vitamin D  deficiency has been defined by the Institute of Medicine  and an Endocrine  Society practice guideline as a level of serum 25-OH  vitamin D  less than 20 ng/mL (1,2). The Endocrine Society went on to  further define vitamin D  insufficiency as a level between 21 and 29  ng/mL (2).  1. IOM (Institute of Medicine). 2010. Dietary reference intakes for  calcium and D. [place]: [employer]. 2. Holick MF, Binkley , Bischoff-Ferrari HA, et al. Evaluation,  treatment, and prevention of vitamin D  deficiency: an Endocrine  Society clinical practice guideline, JCEM. 2011 Jul; 96(7): 1911-30.  Performed at [facility], [address], [place], [place] [address]    Vitamin B-12 07/19/2023 269  180 - 914 pg/mL Final   Comment: (NOTE) This assay is not validated for testing neonatal or myeloproliferative syndrome specimens for Vitamin B12 levels. Performed at [facility], [address], [place], [place] [address]    HIV Screen 4th Generation wRfx 07/19/2023  Non Reactive  Non Reactive Final   Performed at [facility], [address] [place], [place] [address]  Admission on 07/13/2023, Discharged on 07/14/2023  Component Date Value Ref Range Status   WBC 07/13/2023 8.4  4.0 - 10.5 K/uL Final   RBC 07/13/2023 3.81 (L)  4.22 - 5.81 MIL/uL Final   Hemoglobin 07/13/2023 12.6 (L)  13.0 - 17.0 g/dL Final   HCT 82/95/6213 37.8 (L)  39.0 - 52.0 % Final   MCV 07/13/2023 99.2  80.0 - 100.0 fL Final   MCH 07/13/2023 33.1  26.0 - 34.0 pg Final   MCHC 07/13/2023 33.3  30.0 - 36.0 g/dL Final   RDW 08/65/7846 13.2  11.5 - 15.5 % Final   Platelets 07/13/2023 418 (H)  150 - 400 K/uL Final   REPEATED TO VERIFY   nRBC 07/13/2023 0.0  0.0 - 0.2 % Final   Neutrophils Relative % 07/13/2023 55  % Final   Neutro Abs 07/13/2023 4.7  1.7 - 7.7 K/uL Final   Lymphocytes Relative 07/13/2023 25  % Final   Lymphs Abs 07/13/2023 2.1  0.7 - 4.0 K/uL Final   Monocytes Relative 07/13/2023 12  % Final   Monocytes Absolute 07/13/2023 1.0  0.1 -  1.0 K/uL Final   Eosinophils Relative 07/13/2023 7  % Final   Eosinophils Absolute 07/13/2023 0.6 (H)  0.0 - 0.5 K/uL Final   Basophils Relative 07/13/2023 1  % Final   Basophils Absolute 07/13/2023 0.1  0.0 - 0.1 K/uL Final   Immature Granulocytes 07/13/2023 0  % Final   Abs Immature Granulocytes 07/13/2023 0.02  0.00 - 0.07 K/uL Final   Performed at [facility], [address] [place], [place] [address]   Sodium 07/13/2023 131 (L)  135 - 145 mmol/L Final   Potassium 07/13/2023 3.9  3.5 - 5.1 mmol/L Final   Chloride 07/13/2023 99  98 - 111 mmol/L Final   CO2 07/13/2023 23  22 - 32 mmol/L Final   Glucose, Bld 07/13/2023 105 (H)  70 - 99 mg/dL Final   Glucose reference range applies only to samples taken after fasting for at least 8 hours.   BUN 07/13/2023 12  6 - 20 mg/dL Final   Creatinine, Ser 07/13/2023 1.06  0.61 - 1.24 mg/dL Final   Calcium 28/41/3244 8.9  8.9 - 10.3 mg/dL Final   Total Protein 03/22/7251 8.1  6.5 - 8.1 g/dL Final   Albumin 66/44/0347 3.3 (L)  3.5 - 5.0 g/dL Final   AST 42/59/5638 41  15 - 41 U/L Final   ALT 07/13/2023 45 (H)  0 - 44 U/L Final   Alkaline Phosphatase 07/13/2023 64  38 - 126 U/L Final   Total Bilirubin 07/13/2023 0.8  0.0 - 1.2 mg/dL Final   GFR, Estimated 07/13/2023 >60  >60 mL/min Final   Comment: (NOTE) Calculated using the CKD-EPI Creatinine Equation (2021)    Anion gap 07/13/2023 9  5 - 15 Final   Performed at [facility], [address], [place], [place] [address]   Alcohol, Ethyl (B) 07/13/2023 <15  <15 mg/dL Final   Comment: Please note change in reference range. (NOTE) For medical purposes only. Performed at [facility], [address], [place], [place] [address]    Opiates 07/13/2023 NONE DETECTED  NONE DETECTED Final   Cocaine 07/13/2023 NONE DETECTED  NONE DETECTED Final   Benzodiazepines 07/13/2023 POSITIVE (A)  NONE DETECTED Final   Amphetamines 07/13/2023  POSITIVE  (A)  NONE DETECTED Final   Comment: (NOTE) Trazodone  is metabolized in vivo to several metabolites, including pharmacologically active m-CPP, which is excreted in the urine. Immunoassay screens for amphetamines and MDMA have potential cross-reactivity with these compounds and may provide false positive  results.     Tetrahydrocannabinol 07/13/2023 POSITIVE (A)  NONE DETECTED Final   Barbiturates 07/13/2023 POSITIVE (A)  NONE DETECTED Final   Comment: (NOTE) DRUG SCREEN FOR MEDICAL PURPOSES ONLY.  IF CONFIRMATION IS NEEDED FOR ANY PURPOSE, NOTIFY LAB WITHIN 5 DAYS.  LOWEST DETECTABLE LIMITS FOR URINE DRUG SCREEN Drug Class                     Cutoff (ng/mL) Amphetamine and metabolites    1000 Barbiturate and metabolites    200 Benzodiazepine                 200 Opiates and metabolites        300 Cocaine and metabolites        300 THC                            50 Performed at [facility], [address], [place], [place] [address]   Admission on 07/11/2023, Discharged on 07/12/2023  Component Date Value Ref Range Status   Sodium 07/11/2023 134 (L)  135 - 145 mmol/L Final   Potassium 07/11/2023 3.7  3.5 - 5.1 mmol/L Final   Chloride 07/11/2023 100  98 - 111 mmol/L Final   CO2 07/11/2023 22  22 - 32 mmol/L Final   Glucose, Bld 07/11/2023 106 (H)  70 - 99 mg/dL Final   Glucose reference range applies only to samples taken after fasting for at least 8 hours.   BUN 07/11/2023 10  6 - 20 mg/dL Final   Creatinine, Ser 07/11/2023 0.90  0.61 - 1.24 mg/dL Final   Calcium 60/45/4098 9.2  8.9 - 10.3 mg/dL Final   Total Protein 11/91/4782 8.9 (H)  6.5 - 8.1 g/dL Final   Albumin 95/62/1308 3.5  3.5 - 5.0 g/dL Final   AST 65/78/4696 48 (H)  15 - 41 U/L Final   ALT 07/11/2023 55 (H)  0 - 44 U/L Final   Alkaline Phosphatase 07/11/2023 59  38 - 126 U/L Final   Total Bilirubin 07/11/2023 0.6  0.0 - 1.2 mg/dL Final   GFR, Estimated 07/11/2023 >60  >60 mL/min Final    Comment: (NOTE) Calculated using the CKD-EPI Creatinine Equation (2021)    Anion gap 07/11/2023 12  5 - 15 Final   Performed at [facility], [address] [place], [place] [address]   Alcohol, Ethyl (B) 07/11/2023 <15  <15 mg/dL Final   Comment: Please note change in reference range. (NOTE) For medical purposes only. Performed at [facility], [address], [place], [place] [address]    WBC 07/11/2023 9.3  4.0 - 10.5 K/uL Final   RBC 07/11/2023 3.97 (L)  4.22 - 5.81 MIL/uL Final   Hemoglobin 07/11/2023 13.1  13.0 - 17.0 g/dL Final   HCT 40/12/2723 39.3  39.0 - 52.0 % Final   MCV 07/11/2023 99.0  80.0 - 100.0 fL Final   MCH 07/11/2023 33.0  26.0 - 34.0 pg Final   MCHC 07/11/2023 33.3  30.0 - 36.0 g/dL Final   RDW 36/64/4034 13.2  11.5 - 15.5 % Final   Platelets 07/11/2023 558 (H)  150 - 400 K/uL Final   nRBC 07/11/2023 0.0  0.0 - 0.2 % Final   Neutrophils Relative % 07/11/2023 52  % Final   Neutro Abs 07/11/2023 4.8  1.7 - 7.7 K/uL Final   Lymphocytes Relative 07/11/2023 29  % Final   Lymphs Abs 07/11/2023 2.7  0.7 - 4.0 K/uL Final   Monocytes Relative 07/11/2023 13  % Final   Monocytes Absolute 07/11/2023 1.2 (H)  0.1 - 1.0 K/uL Final   Eosinophils Relative 07/11/2023 5  % Final   Eosinophils Absolute 07/11/2023 0.5  0.0 - 0.5 K/uL Final   Basophils Relative 07/11/2023 1  % Final   Basophils Absolute 07/11/2023 0.1  0.0 - 0.1 K/uL Final   Immature Granulocytes 07/11/2023 0  % Final   Abs Immature Granulocytes 07/11/2023 0.02  0.00 - 0.07 K/uL Final   Performed at [facility], [address] [place], [place] [address]   Salicylate Lvl 07/11/2023 <7.0 (L)  7.0 - 30.0 mg/dL Final   Performed at [facility], [address] [place], [place] [address]   Acetaminophen  (Tylenol ), Serum 07/11/2023 <10 (L)  10 - 30 ug/mL Final   Comment: (NOTE) Therapeutic concentrations vary significantly. A range of 10-30 ug/mL  may be an effective concentration for  many patients. However, some  are best treated at concentrations outside of this range. Acetaminophen  concentrations >150 ug/mL at 4 hours after ingestion  and >50 ug/mL at 12 hours after ingestion are often associated with  toxic reactions.  Performed at [facility], [address] [place], [place] [address]    Total CK 07/11/2023 223  49 - 397 U/L Final   Performed at [facility], [address] [place], [place] [address]   Ammonia 07/11/2023 56 (H)  9 - 35 umol/L Final   Performed at [facility], [address] [place], [place] [address]  No results displayed because visit has over 200 results.      Blood Alcohol level:  Lab Results  Component Value Date   [facility] <15 09/04/2023   ETH <15 08/04/2023    Metabolic Disorder Labs: Lab Results  Component Value Date   HGBA1C 4.8 07/19/2023   MPG 91.06 07/19/2023   MPG 105.41 07/09/2022   No results found for: PROLACTIN Lab Results  Component Value Date   CHOL 168 07/19/2023   TRIG 120 07/19/2023   HDL 42 07/19/2023   CHOLHDL 4.0 07/19/2023   VLDL 24 07/19/2023   LDLCALC 102 (H) 07/19/2023   LDLCALC 78 07/09/2022    Therapeutic Lab Levels: No results found for: LITHIUM No results found for: VALPROATE No results found for: CBMZ  Physical Findings   AIMS    Flowsheet Row Clinical Support from 05/19/2022 in [facility]) [facility]) [facility]  AIMS Total Score 10 0 0   AUDIT    Flowsheet Row [facility]) [facility] Ad[facility] (Discharged[facility]  Alcohol Use Disorder Identification Test Final Score (AUDIT) 31 0   GAD-7    Flowsheet Row Video Visit from 01/26/2022 in [facility] Video Visit from 11/01/2021 in [facility] Video Visit from 03/22/2021 in [facility] Office Visit from 12/21/2020 in [facility]  Total GAD-7 Score 17 18 20 19    PHQ2-9    Flowsheet Row ED from 09/06/2023 in [facility] ED from 09/05/2023 in [facility] Office Visit from 11/29/2022 in [facility] ED from 07/09/2022 in [facility] Video Visit from 01/26/2022 in [facility]  PHQ-2 Total Score 2 2 3  0 2  PHQ-9 Total Score 7 7 12 22 8    Flowsheet Row ED from 09/06/2023 in [facility] ED from 09/05/2023 in [facility] ED from 09/04/2023 in [facility] Emergency Department at [facility]  C-SSRS RISK CATEGORY Moderate Risk Error: Q3, 4, or 5 should not be populated when Q2 is No No Risk     Musculoskeletal  Strength & Muscle Tone: within normal limits Gait & Station: normal Patient leans: N/A  Psychiatric Specialty Exam  Presentation  General Appearance:  Appropriate for Environment; Disheveled  Eye Contact: Fair  Speech: Clear and Coherent; Garbled  Speech Volume: Decreased  Handedness: Right   Mood and Affect  Mood: Depressed  Affect: Congruent   Thought Process  Thought Processes: Coherent; Linear  Descriptions of Associations:Intact  Orientation:Full (Time, Place and Person)  Thought Content:Logical  Diagnosis of Schizophrenia or Schizoaffective disorder in past: Yes  Duration of Psychotic Symptoms: Greater than six months   Hallucinations:Hallucinations: Auditory Description of Auditory Hallucinations: telling me to kill myself  Ideas of Reference:None  Suicidal Thoughts:Suicidal Thoughts: No  Homicidal Thoughts:Homicidal  Thoughts: No   Sensorium  Memory: Remote Good  Judgment: Impaired  Insight: Shallow   Executive Functions  Concentration: Good  Attention Span: Good  Recall: Good  Fund of Knowledge: Good  Language: Good   Psychomotor Activity  Psychomotor Activity: Psychomotor Activity: Tremor   Assets  Assets: Communication Skills; Resilience; Desire for Improvement   Sleep  Sleep: Sleep: Fair  No Safety Checks orders active in given range  Nutritional Assessment (For [facility] and [facility] admissions only) Has the patient had a weight loss or gain of 10 pounds or more in the last 3 months?: No Has the patient had a decrease in food intake/or appetite?: No Does the patient have dental problems?: No Does the patient have eating habits or behaviors that may be indicators of an eating disorder including binging or inducing vomiting?: No Has the patient recently lost weight without trying?: 0 Has the patient been eating poorly because of a decreased appetite?: 0 Malnutrition Screening Tool Score: 0    Physical Exam  Physical Exam Vitals reviewed.  Constitutional:      Appearance: Normal appearance.  HENT:     Head: Normocephalic and atraumatic.   Cardiovascular:     Rate and Rhythm: Normal rate.  Pulmonary:     Effort: Pulmonary effort is normal.   Neurological:     General: No focal deficit present.     Mental Status: He is alert and oriented to person, place, and time.    Review of Systems  Constitutional:  Negative for fever.  Respiratory:  Negative for cough.   Cardiovascular:  Negative for chest pain.  Gastrointestinal:  Positive for nausea.  Neurological:  Positive for tremors.  Psychiatric/Behavioral:  Positive for depression.    Blood pressure (!) 138/92, pulse 88, temperature 98 F (36.7 C), temperature source Oral, resp. rate 18, SpO2 100%. There is no height or weight on  file to calculate BMI.  Treatment Plan Summary:  Alcohol Use Disorder EtOH  level: <15; LFTs: AST 79, ALT 51; last use date: 6/18 -Ativan  taper  -Although BAL returned WNL, Pt has hx of w/d seizures and DT so pt has higher risk of severe w/d symptoms -CIWA with Ativan  as needed for CIWA greater than 10  -Last CIWA score is 12 on 6/20 at 7 AM -Thiamine  100 mg IM first day and PO after that -Multivitamin with minerals daily -Tylenol  650 mg every 6 hours as needed for pain -Zofran  4 mg every 6 hours as needed for nausea or vomiting -Imodium  2 to 4 mg as needed for diarrhea or loose stools  -Maalox/Mylanta 30 mL every 4 hours as needed for indigestion -Milk of Mag 30 mL as needed for constipation   Schizophrenia -Continue home Haldol  5 mg BID   Hypokalemia of 3.2, received 40 meq kcl in ED- repeat BMP pending  Dispo: [facility] when bed is available   Joice Nares, MD 09/07/2023 9:48 AM

## 2023-09-07 NOTE — Group Note (Signed)
 Group Topic: Social Support  Group Date: 09/07/2023 Start Time: 1700 End Time: 1710 Facilitators: Jacqueleen Pulver, [place], RN  Department: [facility]  Number of Participants: 1  Group Focus: check in Treatment Modality:  Individual Therapy Interventions utilized were support Purpose: increase insight, social support  Name: Jerry [name] Date of Birth: 1976-02-23  MR: [id number]    Level of Participation: active Quality of Participation: attentive, cooperative Interactions with others: gave feedback Mood/Affect: appropriate Triggers (if applicable): None identified Cognition: coherent/clear and logical Progress: Gaining insight Response: Patient spoke with Clinical research associate regarding how they are feeling today, any concerns, and discharge planning Plan: patient will be encouraged to continue to attend groups  Patients Problems:  Patient Active Problem List   Diagnosis Date Noted   Alcohol use disorder 09/07/2023   Cocaine use disorder, moderate, dependence (HCC) 09/04/2023   Alcohol use disorder, moderate, dependence (HCC) 09/04/2023   Protein-calorie malnutrition, severe 07/03/2023   Left arm cellulitis 07/01/2023   Jaw pain 11/29/2022   Need for influenza vaccination 11/29/2022   Erectile dysfunction 11/29/2022   Hyponatremia 11/29/2022   History of hepatitis C virus infection 11/29/2022   Tardive dyskinesia 10/24/2022   Generalized anxiety disorder 10/24/2022   PTSD (post-traumatic stress disorder) 10/24/2022   Healthcare maintenance 10/24/2022   Alcohol withdrawal (HCC) 07/22/2022   Schizoaffective disorder, bipolar type (HCC) 07/09/2022   Schizophrenia (HCC) 07/07/2022   Auditory hallucination    Malingering 09/07/2020   Homelessness 09/07/2020   Major depressive disorder, recurrent severe without psychotic features (HCC) 07/17/2020   Schizophrenia spectrum disorder with psychotic disorder type not yet determined (HCC) 07/17/2020   Methamphetamine abuse  (HCC) 07/17/2020   Marijuana abuse 07/17/2020   Cocaine abuse (HCC) 07/17/2020   Anxiety and depression 06/25/2014   Family history of diabetes mellitus (DM) 06/25/2014   Family history of thyroid  disease 06/25/2014   Tobacco use disorder 06/25/2014   Alcohol abuse 05/18/2013   Polysubstance abuse (HCC) 05/18/2013   Depression, unspecified 05/18/2013

## 2023-09-07 NOTE — ED Notes (Signed)
 Patient A&Ox4. Has been calm, cooperative, and appropriate with peers. He denies intent to harm self/others. Denies A/VH. Patient endorses physical discomfort related to symptoms of withdrawal. Patient has been sleeping for much of the day. Only up for meals and to use the bathroom. Pt with apneic episodes without a decrease in oxygen saturation. No acute distress observed. Routine safety checks conducted according to facility protocol. Patient agreed to notify staff should thoughts of harm toward self or others arise. We will continue to monitor for safety.

## 2023-09-07 NOTE — ED Notes (Signed)
 atient in the bedroom calm and sleeping. NAD. Respirations even and unlabored, Environment secured per policy.  Will continue to monitor for safety.

## 2023-09-07 NOTE — ED Notes (Signed)
 Patient observed resting quietly, eyes closed. Occasional apneic episodes but unlabored and without a decrease in O2 saturation. Will continue to monitor for safety.

## 2023-09-07 NOTE — Discharge Instructions (Addendum)

## 2023-09-08 DIAGNOSIS — F209 Schizophrenia, unspecified: Secondary | ICD-10-CM | POA: Diagnosis not present

## 2023-09-08 DIAGNOSIS — F1010 Alcohol abuse, uncomplicated: Secondary | ICD-10-CM | POA: Diagnosis not present

## 2023-09-08 DIAGNOSIS — Z5900 Homelessness unspecified: Secondary | ICD-10-CM | POA: Diagnosis not present

## 2023-09-08 DIAGNOSIS — R251 Tremor, unspecified: Secondary | ICD-10-CM | POA: Diagnosis not present

## 2023-09-08 NOTE — Group Note (Signed)
 Group Topic: Relapse and Recovery  Group Date: 09/08/2023 Start Time: 2000 End Time: 2100 Facilitators: Joan Plowman B  Department: [facility]  Number of Participants: 6  Group Focus: abuse issues, check in, communication, community group, and coping skills Treatment Modality:  Individual Therapy Interventions utilized were patient education, problem solving, and support Purpose: enhance coping skills, increase insight, and relapse prevention strategies  Name: Jerry [name] Date of Birth: 01-20-1976  MR: [id number]    Level of Participation: active Quality of Participation: attentive Interactions with others: intrusive Mood/Affect: appropriate Triggers (if applicable): N A Cognition: coherent/clear Progress: Gaining insight Response: N A Plan: patient will be encouraged to go for groups  Patients Problems:  Patient Active Problem List   Diagnosis Date Noted   Alcohol use disorder 09/07/2023   Cocaine use disorder, moderate, dependence (HCC) 09/04/2023   Alcohol use disorder, moderate, dependence (HCC) 09/04/2023   Protein-calorie malnutrition, severe 07/03/2023   Left arm cellulitis 07/01/2023   Jaw pain 11/29/2022   Need for influenza vaccination 11/29/2022   Erectile dysfunction 11/29/2022   Hyponatremia 11/29/2022   History of hepatitis C virus infection 11/29/2022   Tardive dyskinesia 10/24/2022   Generalized anxiety disorder 10/24/2022   PTSD (post-traumatic stress disorder) 10/24/2022   Healthcare maintenance 10/24/2022   Alcohol withdrawal (HCC) 07/22/2022   Schizoaffective disorder, bipolar type (HCC) 07/09/2022   Schizophrenia (HCC) 07/07/2022   Auditory hallucination    Malingering 09/07/2020   Homelessness 09/07/2020   Major depressive disorder, recurrent severe without psychotic features (HCC) 07/17/2020   Schizophrenia spectrum disorder with psychotic disorder type not yet determined (HCC) 07/17/2020   Methamphetamine abuse  (HCC) 07/17/2020   Marijuana abuse 07/17/2020   Cocaine abuse (HCC) 07/17/2020   Anxiety and depression 06/25/2014   Family history of diabetes mellitus (DM) 06/25/2014   Family history of thyroid  disease 06/25/2014   Tobacco use disorder 06/25/2014   Alcohol abuse 05/18/2013   Polysubstance abuse (HCC) 05/18/2013   Depression, unspecified 05/18/2013

## 2023-09-08 NOTE — Group Note (Signed)
 Group Topic: Positive Affirmations  Group Date: 09/08/2023 Start Time: 1220 End Time: 1300 Facilitators: Stanly Stabile, RN  Department: [facility]  Number of Participants: 9  Group Focus: chemical dependency education and chemical dependency issues Treatment Modality:  Behavior Modification Therapy Interventions utilized were clarification, exploration, and patient education Purpose: explore maladaptive thinking, express feelings, express irrational fears, improve communication skills, increase insight, regain self-worth, reinforce self-care, and relapse prevention strategies  Name: Jerry [name] Date of Birth: [birth year]  MR: [id number]    Level of Participation: active Quality of Participation: attentive and cooperative Interactions with others: gave feedback Mood/Affect: appropriate Triggers (if applicable):   Cognition: coherent/clear Progress: Moderate Response:   Plan: follow-up needed  Patients Problems:  Patient Active Problem List   Diagnosis Date Noted   Alcohol use disorder 09/07/2023   Cocaine use disorder, moderate, dependence (HCC) 09/04/2023   Alcohol use disorder, moderate, dependence (HCC) 09/04/2023   Protein-calorie malnutrition, severe 07/03/2023   Left arm cellulitis 07/01/2023   Jaw pain 11/29/2022   Need for influenza vaccination 11/29/2022   Erectile dysfunction 11/29/2022   Hyponatremia 11/29/2022   History of hepatitis C virus infection 11/29/2022   Tardive dyskinesia 10/24/2022   Generalized anxiety disorder 10/24/2022   PTSD (post-traumatic stress disorder) 10/24/2022   Healthcare maintenance 10/24/2022   Alcohol withdrawal (HCC) 07/22/2022   Schizoaffective disorder, bipolar type (HCC) 07/09/2022   Schizophrenia (HCC) 07/07/2022   Auditory hallucination    Malingering 09/07/2020   Homelessness 09/07/2020   Major depressive disorder, recurrent severe without psychotic features (HCC) 07/17/2020    Schizophrenia spectrum disorder with psychotic disorder type not yet determined (HCC) 07/17/2020   Methamphetamine abuse (HCC) 07/17/2020   Marijuana abuse 07/17/2020   Cocaine abuse (HCC) 07/17/2020   Anxiety and depression 06/25/2014   Family history of diabetes mellitus (DM) 06/25/2014   Family history of thyroid  disease 06/25/2014   Tobacco use disorder 06/25/2014   Alcohol abuse 05/18/2013   Polysubstance abuse (HCC) 05/18/2013   Depression, unspecified 05/18/2013

## 2023-09-08 NOTE — ED Notes (Signed)
Pt resting at this hour. No apparent distress. RR even and unlabored. Monitored for safety.  

## 2023-09-08 NOTE — ED Notes (Signed)
 Patient is asleep in bed without issue or concern.  No distress.  Will monitor.

## 2023-09-08 NOTE — ED Notes (Signed)
 Pt CIWA=6. Pt c/o tremors, mild nausea, headache, abdominal cramping and feeling hot/cold. Pt received nighttime scheduled medications and comfort meds as appropriate. Will continue to monitor.

## 2023-09-08 NOTE — ED Provider Notes (Signed)
 Behavioral Health Progress Note  Date and Time: 09/08/2023 4:45 PM Name: Jerry [name] MRN:  [id number]  HPI:  Jerry [name] is a 48 year old with a PPHx of schizophrenia and AUD who presents to the [facility] for detox and rehabilitation services.   Subjective: Patient seen in the milieu no acute distress. He reports feeling slightly better but still tremulous. He reports drinking 1/5 vodka daily, most recently on 6/18. He also has been using crack cocaine daily.  He currently denies SI and HI. He states that he wants to detox and is interested in going to residential rehab.   Diagnosis:  Final diagnoses:  Polysubstance abuse (HCC)  Hallucinations  Alcohol use disorder Cocaine use disorder Stimulant use disorder Substance-induced mood disorder, rule-out   Total Time spent with patient: 15 minutes   Past Psychiatric History:  Dx: schizophrenia, AUD, stimulant use d/o cocaine type Rx: per dc summary from [facility] admission on 06/2023 Haldol  5 mg BID, Atarax  PRN, thiamine  daily, trazodone  PRN 6 IP psych hospitalizations in 2025 for paranoia and alcohol use History of w/d seizures, reporting most recently 3 years ago History of DT, reporting most recently one year ago Denies previous suicide attempts   Past Medical History: None reported   Family Psychiatric  History:  Multiple Members- EtOH and Polysubstance Abuse No Known Diagnosis' or Suicides  Social History: Patient is unhoused and unemployed   Additional Social History:  Pain Medications: SEE MAR Prescriptions: SEE MAR Over the Counter: see MAR History of alcohol / drug use?: Yes Longest period of sobriety (when/how long): 3 yrs 2018-2020 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, Armed forces operational officer, Personal relationships, Work / Programmer, multimedia Withdrawal Symptoms: Agitation, Tingling, Patient aware of relationship between substance abuse and physical/medical complications, Fever / Chills, Tremors, Sweats, Irritability, Weakness, Blackouts, Diarrhea, Nausea  / Vomiting (Currently reporting withdrawal with trembling, nausea, naxiety) Name of Substance 1: alcohol 1 - Age of First Use: teen 1 - Amount (size/oz): unknown 1 - Frequency: was daily but pt stated he has not had any alcohol in over 24 hours 1 - Duration: ongoing 1 - Last Use / Amount: 09/05/23 1 - Method of Aquiring: purchase 1- Route of Use: oral Name of Substance 2: Hx of meth use 2 - Age of First Use: unknown 2 - Amount (size/oz): unknown 2 - Frequency: stated he stopped a few weeks ago 2 - Duration: stated he stopped a few weeks ago 2 - Last Use / Amount: unknown 2 - Method of Aquiring: unknown 2 - Route of Substance Use: unknown Name of Substance 3: Hx of cannabis use 3 - Age of First Use: teen 3 - Amount (size/oz): unknown 3 - Frequency: daily until a few weeks ago per pt 3 - Duration: stated he stopped a few weeks ago 3 - Last Use / Amount: unknown 3 - Method of Aquiring: unknown 3 - Route of Substance Use: smoke  Sleep: Fair  Appetite:  Good  Current Medications:  Current Facility-Administered Medications  Medication Dose Route Frequency Provider Last Rate Last Admin   acetaminophen  (TYLENOL ) tablet 650 mg  650 mg Oral Q6H PRN Hoang, Daniela B, MD       alum & mag hydroxide-simeth (MAALOX/MYLANTA) 200-200-20 MG/5ML suspension 30 mL  30 mL Oral Q4H PRN Hoang, Daniela B, MD       haloperidol  (HALDOL ) tablet 5 mg  5 mg Oral TID PRN Hoang, Daniela B, MD       And   diphenhydrAMINE  (BENADRYL ) capsule 50 mg  50  mg Oral TID PRN Hoang, Daniela B, MD       haloperidol  lactate (HALDOL ) injection 5 mg  5 mg Intramuscular TID PRN Hoang, Daniela B, MD       And   diphenhydrAMINE  (BENADRYL ) injection 50 mg  50 mg Intramuscular TID PRN Hoang, Daniela B, MD       And   LORazepam  (ATIVAN ) injection 2 mg  2 mg Intramuscular TID PRN Hoang, Daniela B, MD       haloperidol  lactate (HALDOL ) injection 10 mg  10 mg Intramuscular TID PRN Hoang, Daniela B, MD       And   diphenhydrAMINE   (BENADRYL ) injection 50 mg  50 mg Intramuscular TID PRN Hoang, Daniela B, MD       And   LORazepam  (ATIVAN ) injection 2 mg  2 mg Intramuscular TID PRN Hoang, Daniela B, MD       haloperidol  (HALDOL ) tablet 5 mg  5 mg Oral BID Hoang, Daniela B, MD   5 mg at 09/08/23 0957   hydrOXYzine  (ATARAX ) tablet 25 mg  25 mg Oral TID PRN Hoang, Daniela B, MD   25 mg at 09/07/23 2117   loperamide  (IMODIUM ) capsule 2-4 mg  2-4 mg Oral PRN Hoang, Daniela B, MD       LORazepam  (ATIVAN ) tablet 1 mg  1 mg Oral Q6H PRN Hoang, Daniela B, MD   1 mg at 09/07/23 1451   LORazepam  (ATIVAN ) tablet 1 mg  1 mg Oral TID Hoang, Daniela B, MD   1 mg at 09/08/23 1538   Followed by   NOREEN ON 09/09/2023] LORazepam  (ATIVAN ) tablet 1 mg  1 mg Oral BID Hoang, Daniela B, MD       Followed by   NOREEN ON 09/10/2023] LORazepam  (ATIVAN ) tablet 1 mg  1 mg Oral Daily Hoang, Daniela B, MD       magnesium  hydroxide (MILK OF MAGNESIA) suspension 30 mL  30 mL Oral Daily PRN Hoang, Daniela B, MD       multivitamin with minerals tablet 1 tablet  1 tablet Oral Daily Hoang, Daniela B, MD   1 tablet at 09/08/23 0957   ondansetron  (ZOFRAN -ODT) disintegrating tablet 4 mg  4 mg Oral Q6H PRN Hoang, Daniela B, MD       thiamine  (VITAMIN B1) tablet 100 mg  100 mg Oral Daily Hoang, Daniela B, MD   100 mg at 09/08/23 0957   traZODone  (DESYREL ) tablet 50 mg  50 mg Oral QHS PRN Hoang, Daniela B, MD   50 mg at 09/07/23 2116   Current Outpatient Medications  Medication Sig Dispense Refill   haloperidol  (HALDOL ) 5 MG tablet Take 1 tablet (5 mg total) by mouth 2 (two) times daily. (Patient not taking: Reported on 08/06/2023) 60 tablet 0   hydrOXYzine  (ATARAX ) 25 MG tablet Take 1 tablet (25 mg total) by mouth 3 (three) times daily as needed for anxiety. (Patient not taking: Reported on 08/06/2023) 30 tablet 0   thiamine  (VITAMIN B-1) 100 MG tablet Take 1 tablet (100 mg total) by mouth daily. (Patient not taking: Reported on 08/06/2023) 30 tablet 0   traZODone   (DESYREL ) 50 MG tablet Take 1 tablet (50 mg total) by mouth at bedtime as needed for sleep. (Patient not taking: Reported on 08/06/2023) 30 tablet 0    Labs  Lab Results:  Admission on 09/06/2023, Discharged on 09/07/2023  Component Date Value Ref Range Status   POC Amphetamine UR 09/06/2023 None Detected  NONE DETECTED (Cut Off  Level 1000 ng/mL) Final   POC Secobarbital (BAR) 09/06/2023 None Detected  NONE DETECTED (Cut Off Level 300 ng/mL) Final   POC Buprenorphine (BUP) 09/06/2023 None Detected  NONE DETECTED (Cut Off Level 10 ng/mL) Final   POC Oxazepam (BZO) 09/06/2023 Positive (A)  NONE DETECTED (Cut Off Level 300 ng/mL) Final   POC Cocaine UR 09/06/2023 Positive (A)  NONE DETECTED (Cut Off Level 300 ng/mL) Final   POC Methamphetamine UR 09/06/2023 None Detected  NONE DETECTED (Cut Off Level 1000 ng/mL) Final   POC Morphine 09/06/2023 None Detected  NONE DETECTED (Cut Off Level 300 ng/mL) Final   POC Methadone UR 09/06/2023 None Detected  NONE DETECTED (Cut Off Level 300 ng/mL) Final   POC Oxycodone  UR 09/06/2023 None Detected  NONE DETECTED (Cut Off Level 100 ng/mL) Final   POC Marijuana UR 09/06/2023 Positive (A)  NONE DETECTED (Cut Off Level 50 ng/mL) Final  Admission on 09/05/2023, Discharged on 09/05/2023  Component Date Value Ref Range Status   Sodium 09/05/2023 134 (L)  135 - 145 mmol/L Final   Potassium 09/05/2023 3.2 (L)  3.5 - 5.1 mmol/L Final   Chloride 09/05/2023 100  98 - 111 mmol/L Final   CO2 09/05/2023 24  22 - 32 mmol/L Final   Glucose, Bld 09/05/2023 117 (H)  70 - 99 mg/dL Final   Glucose reference range applies only to samples taken after fasting for at least 8 hours.   BUN 09/05/2023 7  6 - 20 mg/dL Final   Creatinine, Ser 09/05/2023 0.77  0.61 - 1.24 mg/dL Final   Calcium 93/81/7974 9.0  8.9 - 10.3 mg/dL Final   Total Protein 93/81/7974 8.2 (H)  6.5 - 8.1 g/dL Final   Albumin 93/81/7974 3.2 (L)  3.5 - 5.0 g/dL Final   AST 93/81/7974 79 (H)  15 - 41 U/L Final    ALT 09/05/2023 51 (H)  0 - 44 U/L Final   Alkaline Phosphatase 09/05/2023 68  38 - 126 U/L Final   Total Bilirubin 09/05/2023 1.1  0.0 - 1.2 mg/dL Final   GFR, Estimated 09/05/2023 >60  >60 mL/min Final   Comment: (NOTE) Calculated using the CKD-EPI Creatinine Equation (2021)    Anion gap 09/05/2023 10  5 - 15 Final   Performed at [facility], [address], [place], [place] [address]   WBC 09/05/2023 9.6  4.0 - 10.5 K/uL Final   RBC 09/05/2023 4.24  4.22 - 5.81 MIL/uL Final   Hemoglobin 09/05/2023 13.9  13.0 - 17.0 g/dL Final   HCT 93/81/7974 41.2  39.0 - 52.0 % Final   MCV 09/05/2023 97.2  80.0 - 100.0 fL Final   MCH 09/05/2023 32.8  26.0 - 34.0 pg Final   MCHC 09/05/2023 33.7  30.0 - 36.0 g/dL Final   RDW 93/81/7974 13.3  11.5 - 15.5 % Final   Platelets 09/05/2023 305  150 - 400 K/uL Final   nRBC 09/05/2023 0.0  0.0 - 0.2 % Final   Performed at [facility], [address], [place], [place] [address]  Admission on 09/04/2023, Discharged on 09/05/2023  Component Date Value Ref Range Status   Sodium 09/04/2023 128 (L)  135 - 145 mmol/L Final   Potassium 09/04/2023 3.0 (L)  3.5 - 5.1 mmol/L Final   Chloride 09/04/2023 97 (L)  98 - 111 mmol/L Final   CO2 09/04/2023 21 (L)  22 - 32 mmol/L Final   Glucose, Bld 09/04/2023 132 (H)  70 - 99  mg/dL Final   Glucose reference range applies only to samples taken after fasting for at least 8 hours.   BUN 09/04/2023 8  6 - 20 mg/dL Final   Creatinine, Ser 09/04/2023 0.86  0.61 - 1.24 mg/dL Final   Calcium 93/82/7974 8.1 (L)  8.9 - 10.3 mg/dL Final   Total Protein 93/82/7974 7.9  6.5 - 8.1 g/dL Final   Albumin 93/82/7974 3.2 (L)  3.5 - 5.0 g/dL Final   AST 93/82/7974 74 (H)  15 - 41 U/L Final   ALT 09/04/2023 48 (H)  0 - 44 U/L Final   Alkaline Phosphatase 09/04/2023 66  38 - 126 U/L Final   Total Bilirubin 09/04/2023 0.7  0.0 - 1.2 mg/dL Final   GFR, Estimated 09/04/2023 >60  >60 mL/min Final    Comment: (NOTE) Calculated using the CKD-EPI Creatinine Equation (2021)    Anion gap 09/04/2023 10  5 - 15 Final   Performed at [facility], [address] [place], [place] [address]   Alcohol, Ethyl (B) 09/04/2023 <15  <15 mg/dL Final   Comment: (NOTE) For medical purposes only. Performed at [facility], [address], [place], [place] [address]    Opiates 09/04/2023 NONE DETECTED  NONE DETECTED Final   Cocaine 09/04/2023 POSITIVE (A)  NONE DETECTED Final   Benzodiazepines 09/04/2023 NONE DETECTED  NONE DETECTED Final   Amphetamines 09/04/2023 NONE DETECTED  NONE DETECTED Final   Tetrahydrocannabinol 09/04/2023 NONE DETECTED  NONE DETECTED Final   Barbiturates 09/04/2023 NONE DETECTED  NONE DETECTED Final   Comment: (NOTE) DRUG SCREEN FOR MEDICAL PURPOSES ONLY.  IF CONFIRMATION IS NEEDED FOR ANY PURPOSE, NOTIFY LAB WITHIN 5 DAYS.  LOWEST DETECTABLE LIMITS FOR URINE DRUG SCREEN Drug Class                     Cutoff (ng/mL) Amphetamine and metabolites    1000 Barbiturate and metabolites    200 Benzodiazepine                 200 Opiates and metabolites        300 Cocaine and metabolites        300 THC                            50 Performed at [facility], [address], [place], [place] [address]    WBC 09/04/2023 10.4  4.0 - 10.5 K/uL Final   RBC 09/04/2023 4.00 (L)  4.22 - 5.81 MIL/uL Final   Hemoglobin 09/04/2023 13.2  13.0 - 17.0 g/dL Final   HCT 93/82/7974 38.8 (L)  39.0 - 52.0 % Final   MCV 09/04/2023 97.0  80.0 - 100.0 fL Final   MCH 09/04/2023 33.0  26.0 - 34.0 pg Final   MCHC 09/04/2023 34.0  30.0 - 36.0 g/dL Final   RDW 93/82/7974 13.2  11.5 - 15.5 % Final   Platelets 09/04/2023 276  150 - 400 K/uL Final   nRBC 09/04/2023 0.0  0.0 - 0.2 % Final   Neutrophils Relative % 09/04/2023 83  % Final   Neutro Abs 09/04/2023 8.6 (H)  1.7 - 7.7 K/uL Final   Lymphocytes Relative 09/04/2023 7  % Final   Lymphs  Abs 09/04/2023 0.7  0.7 - 4.0 K/uL Final   Monocytes Relative 09/04/2023 10  % Final   Monocytes Absolute 09/04/2023 1.1 (H)  0.1 - 1.0 K/uL Final  Eosinophils Relative 09/04/2023 0  % Final   Eosinophils Absolute 09/04/2023 0.0  0.0 - 0.5 K/uL Final   Basophils Relative 09/04/2023 0  % Final   Basophils Absolute 09/04/2023 0.0  0.0 - 0.1 K/uL Final   Immature Granulocytes 09/04/2023 0  % Final   Abs Immature Granulocytes 09/04/2023 0.03  0.00 - 0.07 K/uL Final   Performed at [facility], [address], [place], [place] [address]   Potassium 09/05/2023 3.6  3.5 - 5.1 mmol/L Final   Performed at [facility], [address], [place], [place] [address]  Admission on 08/04/2023, Discharged on 08/07/2023  Component Date Value Ref Range Status   Sodium 08/04/2023 132 (L)  135 - 145 mmol/L Final   Potassium 08/04/2023 4.0  3.5 - 5.1 mmol/L Final   Chloride 08/04/2023 99  98 - 111 mmol/L Final   CO2 08/04/2023 25  22 - 32 mmol/L Final   Glucose, Bld 08/04/2023 100 (H)  70 - 99 mg/dL Final   Glucose reference range applies only to samples taken after fasting for at least 8 hours.   BUN 08/04/2023 10  6 - 20 mg/dL Final   Creatinine, Ser 08/04/2023 0.98  0.61 - 1.24 mg/dL Final   Calcium 94/82/7974 9.6  8.9 - 10.3 mg/dL Final   Total Protein 94/82/7974 9.1 (H)  6.5 - 8.1 g/dL Final   Albumin 94/82/7974 4.1  3.5 - 5.0 g/dL Final   AST 94/82/7974 32  15 - 41 U/L Final   ALT 08/04/2023 23  0 - 44 U/L Final   Alkaline Phosphatase 08/04/2023 59  38 - 126 U/L Final   Total Bilirubin 08/04/2023 1.1  0.0 - 1.2 mg/dL Final   GFR, Estimated 08/04/2023 >60  >60 mL/min Final   Comment: (NOTE) Calculated using the CKD-EPI Creatinine Equation (2021)    Anion gap 08/04/2023 8  5 - 15 Final   Performed at [facility], [address] [place], [place] [address]   Alcohol, Ethyl (B) 08/04/2023 <15  <15 mg/dL Final   Comment: Please note change in  reference range. (NOTE) For medical purposes only. Performed at [facility], [address], [place], [place] [address]    Opiates 08/04/2023 NONE DETECTED  NONE DETECTED Final   Cocaine 08/04/2023 POSITIVE (A)  NONE DETECTED Final   Benzodiazepines 08/04/2023 POSITIVE (A)  NONE DETECTED Final   Amphetamines 08/04/2023 POSITIVE (A)  NONE DETECTED Final   Comment: (NOTE) Trazodone  is metabolized in vivo to several metabolites, including pharmacologically active m-CPP, which is excreted in the urine. Immunoassay screens for amphetamines and MDMA have potential cross-reactivity with these compounds and may provide false positive  results.     Tetrahydrocannabinol 08/04/2023 NONE DETECTED  NONE DETECTED Final   Barbiturates 08/04/2023 NONE DETECTED  NONE DETECTED Final   Comment: (NOTE) DRUG SCREEN FOR MEDICAL PURPOSES ONLY.  IF CONFIRMATION IS NEEDED FOR ANY PURPOSE, NOTIFY LAB WITHIN 5 DAYS.  LOWEST DETECTABLE LIMITS FOR URINE DRUG SCREEN Drug Class                     Cutoff (ng/mL) Amphetamine and metabolites    1000 Barbiturate and metabolites    200 Benzodiazepine                 200 Opiates and metabolites        300 Cocaine and metabolites        300 THC  50 Performed at [facility], [address], [place], [place] [address]    WBC 08/04/2023 7.4  4.0 - 10.5 K/uL Final   RBC 08/04/2023 4.07 (L)  4.22 - 5.81 MIL/uL Final   Hemoglobin 08/04/2023 13.5  13.0 - 17.0 g/dL Final   HCT 94/82/7974 40.7  39.0 - 52.0 % Final   MCV 08/04/2023 100.0  80.0 - 100.0 fL Final   MCH 08/04/2023 33.2  26.0 - 34.0 pg Final   MCHC 08/04/2023 33.2  30.0 - 36.0 g/dL Final   RDW 94/82/7974 13.3  11.5 - 15.5 % Final   Platelets 08/04/2023 329  150 - 400 K/uL Final   nRBC 08/04/2023 0.0  0.0 - 0.2 % Final   Neutrophils Relative % 08/04/2023 69  % Final   Neutro Abs 08/04/2023 5.1  1.7 - 7.7 K/uL Final   Lymphocytes Relative  08/04/2023 19  % Final   Lymphs Abs 08/04/2023 1.4  0.7 - 4.0 K/uL Final   Monocytes Relative 08/04/2023 10  % Final   Monocytes Absolute 08/04/2023 0.8  0.1 - 1.0 K/uL Final   Eosinophils Relative 08/04/2023 1  % Final   Eosinophils Absolute 08/04/2023 0.1  0.0 - 0.5 K/uL Final   Basophils Relative 08/04/2023 0  % Final   Basophils Absolute 08/04/2023 0.0  0.0 - 0.1 K/uL Final   Immature Granulocytes 08/04/2023 1  % Final   Abs Immature Granulocytes 08/04/2023 0.04  0.00 - 0.07 K/uL Final   Performed at [facility], [address], [place], [place] [address]  Admission on 07/14/2023, Discharged on 07/19/2023  Component Date Value Ref Range Status   Sodium 07/19/2023 131 (L)  135 - 145 mmol/L Final   Potassium 07/19/2023 4.3  3.5 - 5.1 mmol/L Final   Chloride 07/19/2023 98  98 - 111 mmol/L Final   CO2 07/19/2023 23  22 - 32 mmol/L Final   Glucose, Bld 07/19/2023 104 (H)  70 - 99 mg/dL Final   Glucose reference range applies only to samples taken after fasting for at least 8 hours.   BUN 07/19/2023 16  6 - 20 mg/dL Final   Creatinine, Ser 07/19/2023 0.76  0.61 - 1.24 mg/dL Final   Calcium 94/98/7974 9.2  8.9 - 10.3 mg/dL Final   Total Protein 94/98/7974 8.5 (H)  6.5 - 8.1 g/dL Final   Albumin 94/98/7974 3.5  3.5 - 5.0 g/dL Final   AST 94/98/7974 30  15 - 41 U/L Final   ALT 07/19/2023 34  0 - 44 U/L Final   Alkaline Phosphatase 07/19/2023 51  38 - 126 U/L Final   Total Bilirubin 07/19/2023 0.6  0.0 - 1.2 mg/dL Final   GFR, Estimated 07/19/2023 >60  >60 mL/min Final   Comment: (NOTE) Calculated using the CKD-EPI Creatinine Equation (2021)    Anion gap 07/19/2023 10  5 - 15 Final   Performed at [facility], [address], [place], [place] [address]   Hgb A1c MFr Bld 07/19/2023 4.8  4.8 - 5.6 % Final   Comment: (NOTE) Pre diabetes:          5.7%-6.4%  Diabetes:              >6.4%  Glycemic control for   <7.0% adults with diabetes    Mean  Plasma Glucose 07/19/2023 91.06  mg/dL Final   Performed at [facility], [address], [place], [place] [address]   Cholesterol 07/19/2023 168  0 - 200 mg/dL Final   Triglycerides 94/98/7974 120  <150 mg/dL Final   HDL 94/98/7974 42  >40 mg/dL Final   Total CHOL/HDL Ratio 07/19/2023 4.0  RATIO Final   VLDL 07/19/2023 24  0 - 40 mg/dL Final   LDL Cholesterol 07/19/2023 102 (H)  0 - 99 mg/dL Final   Comment:        Total Cholesterol/HDL:CHD Risk Coronary Heart Disease Risk Table                     Men   Women  1/2 Average Risk   3.4   3.3  Average Risk       5.0   4.4  2 X Average Risk   9.6   7.1  3 X Average Risk  23.4   11.0        Use the calculated Patient Ratio above and the CHD Risk Table to determine the patient's CHD Risk.        ATP III CLASSIFICATION (LDL):  <100     mg/dL   Optimal  899-870  mg/dL   Near or Above                    Optimal  130-159  mg/dL   Borderline  839-810  mg/dL   High  >809     mg/dL   Very High Performed at [facility], [address], [place], [place] [address]    RPR Ser Ql 07/19/2023 NON REACTIVE  NON REACTIVE Final   Performed at [facility], [address], [place], [place] [address]   TSH 07/19/2023 1.931  0.350 - 4.500 uIU/mL Final   Comment: Performed by a 3rd Generation assay with a functional sensitivity of <=0.01 uIU/mL. Performed at [facility], [address] [place], [place] [address]    Vit D, 25-Hydroxy 07/19/2023 32.02  30 - 100 ng/mL Final   Comment: (NOTE) Vitamin D  deficiency has been defined by the Institute of Medicine  and an Endocrine Society practice guideline as a level of serum 25-OH  vitamin D  less than 20 ng/mL (1,2). The Endocrine Society went on to  further define vitamin D  insufficiency as a level between 21 and 29  ng/mL (2).  1. IOM (Institute of Medicine). 2010. Dietary reference intakes for  calcium and D. [place]: [employer]. 2. Holick MF, Binkley Climax, Bischoff-Ferrari HA, et al. Evaluation,  treatment, and prevention of vitamin D  deficiency: an Endocrine  Society clinical practice guideline, JCEM. 2011 Jul; 96(7): 1911-30.  Performed at [facility], [address], [place], [place] [address]    Vitamin B-12 07/19/2023 269  180 - 914 pg/mL Final   Comment: (NOTE) This assay is not validated for testing neonatal or myeloproliferative syndrome specimens for Vitamin B12 levels. Performed at [facility], [address] [place], [place] [address]    HIV Screen 4th Generation wRfx 07/19/2023 Non Reactive  Non Reactive Final   Performed at [facility], [address], [place], [place] [address]  Admission on 07/13/2023, Discharged on 07/14/2023  Component Date Value Ref Range Status   WBC 07/13/2023 8.4  4.0 - 10.5 K/uL Final   RBC 07/13/2023 3.81 (L)  4.22 - 5.81 MIL/uL Final   Hemoglobin 07/13/2023 12.6 (L)  13.0 - 17.0 g/dL Final   HCT 95/74/7974 37.8 (L)  39.0 - 52.0 % Final  MCV 07/13/2023 99.2  80.0 - 100.0 fL Final   MCH 07/13/2023 33.1  26.0 - 34.0 pg Final   MCHC 07/13/2023 33.3  30.0 - 36.0 g/dL Final   RDW 95/74/7974 13.2  11.5 - 15.5 % Final   Platelets 07/13/2023 418 (H)  150 - 400 K/uL Final   REPEATED TO VERIFY   nRBC 07/13/2023 0.0  0.0 - 0.2 % Final   Neutrophils Relative % 07/13/2023 55  % Final   Neutro Abs 07/13/2023 4.7  1.7 - 7.7 K/uL Final   Lymphocytes Relative 07/13/2023 25  % Final   Lymphs Abs 07/13/2023 2.1  0.7 - 4.0 K/uL Final   Monocytes Relative 07/13/2023 12  % Final   Monocytes Absolute 07/13/2023 1.0  0.1 - 1.0 K/uL Final   Eosinophils Relative 07/13/2023 7  % Final   Eosinophils Absolute 07/13/2023 0.6 (H)  0.0 - 0.5 K/uL Final   Basophils Relative 07/13/2023 1  % Final   Basophils Absolute 07/13/2023 0.1  0.0 - 0.1 K/uL Final   Immature Granulocytes 07/13/2023 0  % Final   Abs Immature Granulocytes 07/13/2023 0.02  0.00 - 0.07  K/uL Final   Performed at [facility], [address] [place], [place] [address]   Sodium 07/13/2023 131 (L)  135 - 145 mmol/L Final   Potassium 07/13/2023 3.9  3.5 - 5.1 mmol/L Final   Chloride 07/13/2023 99  98 - 111 mmol/L Final   CO2 07/13/2023 23  22 - 32 mmol/L Final   Glucose, Bld 07/13/2023 105 (H)  70 - 99 mg/dL Final   Glucose reference range applies only to samples taken after fasting for at least 8 hours.   BUN 07/13/2023 12  6 - 20 mg/dL Final   Creatinine, Ser 07/13/2023 1.06  0.61 - 1.24 mg/dL Final   Calcium 95/74/7974 8.9  8.9 - 10.3 mg/dL Final   Total Protein 95/74/7974 8.1  6.5 - 8.1 g/dL Final   Albumin 95/74/7974 3.3 (L)  3.5 - 5.0 g/dL Final   AST 95/74/7974 41  15 - 41 U/L Final   ALT 07/13/2023 45 (H)  0 - 44 U/L Final   Alkaline Phosphatase 07/13/2023 64  38 - 126 U/L Final   Total Bilirubin 07/13/2023 0.8  0.0 - 1.2 mg/dL Final   GFR, Estimated 07/13/2023 >60  >60 mL/min Final   Comment: (NOTE) Calculated using the CKD-EPI Creatinine Equation (2021)    Anion gap 07/13/2023 9  5 - 15 Final   Performed at [facility], [address], [place], [place] [address]   Alcohol, Ethyl (B) 07/13/2023 <15  <15 mg/dL Final   Comment: Please note change in reference range. (NOTE) For medical purposes only. Performed at [facility], [address] [place], [place] [address]    Opiates 07/13/2023 NONE DETECTED  NONE DETECTED Final   Cocaine 07/13/2023 NONE DETECTED  NONE DETECTED Final   Benzodiazepines 07/13/2023 POSITIVE (A)  NONE DETECTED Final   Amphetamines 07/13/2023 POSITIVE (A)  NONE DETECTED Final   Comment: (NOTE) Trazodone  is metabolized in vivo to several metabolites, including pharmacologically active m-CPP, which is excreted in the urine. Immunoassay screens for amphetamines and MDMA have potential cross-reactivity with these compounds and may provide false positive  results.      Tetrahydrocannabinol 07/13/2023 POSITIVE (A)  NONE DETECTED Final   Barbiturates 07/13/2023 POSITIVE (A)  NONE DETECTED Final   Comment: (NOTE) DRUG SCREEN FOR MEDICAL PURPOSES ONLY.  IF CONFIRMATION IS  NEEDED FOR ANY PURPOSE, NOTIFY LAB WITHIN 5 DAYS.  LOWEST DETECTABLE LIMITS FOR URINE DRUG SCREEN Drug Class                     Cutoff (ng/mL) Amphetamine and metabolites    1000 Barbiturate and metabolites    200 Benzodiazepine                 200 Opiates and metabolites        300 Cocaine and metabolites        300 THC                            50 Performed at [facility], [address] [place], [place] [address]   Admission on 07/11/2023, Discharged on 07/12/2023  Component Date Value Ref Range Status   Sodium 07/11/2023 134 (L)  135 - 145 mmol/L Final   Potassium 07/11/2023 3.7  3.5 - 5.1 mmol/L Final   Chloride 07/11/2023 100  98 - 111 mmol/L Final   CO2 07/11/2023 22  22 - 32 mmol/L Final   Glucose, Bld 07/11/2023 106 (H)  70 - 99 mg/dL Final   Glucose reference range applies only to samples taken after fasting for at least 8 hours.   BUN 07/11/2023 10  6 - 20 mg/dL Final   Creatinine, Ser 07/11/2023 0.90  0.61 - 1.24 mg/dL Final   Calcium 95/76/7974 9.2  8.9 - 10.3 mg/dL Final   Total Protein 95/76/7974 8.9 (H)  6.5 - 8.1 g/dL Final   Albumin 95/76/7974 3.5  3.5 - 5.0 g/dL Final   AST 95/76/7974 48 (H)  15 - 41 U/L Final   ALT 07/11/2023 55 (H)  0 - 44 U/L Final   Alkaline Phosphatase 07/11/2023 59  38 - 126 U/L Final   Total Bilirubin 07/11/2023 0.6  0.0 - 1.2 mg/dL Final   GFR, Estimated 07/11/2023 >60  >60 mL/min Final   Comment: (NOTE) Calculated using the CKD-EPI Creatinine Equation (2021)    Anion gap 07/11/2023 12  5 - 15 Final   Performed at [facility], [address], [place], [place] [address]   Alcohol, Ethyl (B) 07/11/2023 <15  <15 mg/dL Final   Comment: Please note change in reference range. (NOTE) For medical purposes  only. Performed at [facility], [address] [place], [place] [address]    WBC 07/11/2023 9.3  4.0 - 10.5 K/uL Final   RBC 07/11/2023 3.97 (L)  4.22 - 5.81 MIL/uL Final   Hemoglobin 07/11/2023 13.1  13.0 - 17.0 g/dL Final   HCT 95/76/7974 39.3  39.0 - 52.0 % Final   MCV 07/11/2023 99.0  80.0 - 100.0 fL Final   MCH 07/11/2023 33.0  26.0 - 34.0 pg Final   MCHC 07/11/2023 33.3  30.0 - 36.0 g/dL Final   RDW 95/76/7974 13.2  11.5 - 15.5 % Final   Platelets 07/11/2023 558 (H)  150 - 400 K/uL Final   nRBC 07/11/2023 0.0  0.0 - 0.2 % Final   Neutrophils Relative % 07/11/2023 52  % Final   Neutro Abs 07/11/2023 4.8  1.7 - 7.7 K/uL Final   Lymphocytes Relative 07/11/2023 29  % Final   Lymphs Abs 07/11/2023 2.7  0.7 - 4.0 K/uL Final   Monocytes Relative 07/11/2023 13  % Final   Monocytes Absolute 07/11/2023 1.2 (H)  0.1 - 1.0 K/uL Final  Eosinophils Relative 07/11/2023 5  % Final   Eosinophils Absolute 07/11/2023 0.5  0.0 - 0.5 K/uL Final   Basophils Relative 07/11/2023 1  % Final   Basophils Absolute 07/11/2023 0.1  0.0 - 0.1 K/uL Final   Immature Granulocytes 07/11/2023 0  % Final   Abs Immature Granulocytes 07/11/2023 0.02  0.00 - 0.07 K/uL Final   Performed at [facility], [address], [place], [place] [address]   Salicylate Lvl 07/11/2023 <7.0 (L)  7.0 - 30.0 mg/dL Final   Performed at [facility], [address], [place], [place] [address]   Acetaminophen  (Tylenol ), Serum 07/11/2023 <10 (L)  10 - 30 ug/mL Final   Comment: (NOTE) Therapeutic concentrations vary significantly. A range of 10-30 ug/mL  may be an effective concentration for many patients. However, some  are best treated at concentrations outside of this range. Acetaminophen  concentrations >150 ug/mL at 4 hours after ingestion  and >50 ug/mL at 12 hours after ingestion are often associated with  toxic reactions.  Performed at [facility], [address] [place], [place] [address]     Total CK 07/11/2023 223  49 - 397 U/L Final   Performed at [facility], [address], [place], [place] [address]   Ammonia 07/11/2023 56 (H)  9 - 35 umol/L Final   Performed at [facility], [address], [place], [place] [address]  No results displayed because visit has over 200 results.      Blood Alcohol level:  Lab Results  Component Value Date   [facility] <15 09/04/2023   ETH <15 08/04/2023    Metabolic Disorder Labs: Lab Results  Component Value Date   HGBA1C 4.8 07/19/2023   MPG 91.06 07/19/2023   MPG 105.41 07/09/2022   No results found for: PROLACTIN Lab Results  Component Value Date   CHOL 168 07/19/2023   TRIG 120 07/19/2023   HDL 42 07/19/2023   CHOLHDL 4.0 07/19/2023   VLDL 24 07/19/2023   LDLCALC 102 (H) 07/19/2023   LDLCALC 78 07/09/2022    Therapeutic Lab Levels: No results found for: LITHIUM No results found for: VALPROATE No results found for: CBMZ  Physical Findings   AIMS    Flowsheet Row Clinical Support from 05/19/2022 in [facility]) [facility]) [facility]  AIMS Total Score 10 0 0   AUDIT    Flowsheet Row ED from 09/07/2023 in [facility] Admission (Discharged) [facility]) [facility]  Alcohol Use Disorder Identification Test Final Score (AUDIT) 24 31 0   GAD-7    Flowsheet Row Video Visit from 01/26/2022 in [facility] Video Visit from 11/01/2021 in [facility] Video Visit from 03/22/2021 in [facility] Office Visit from 12/21/2020 in [facility]  Total GAD-7 Score 17 18 20 19     PHQ2-9    Flowsheet Row ED from 09/06/2023 in [facility] ED from 09/05/2023 in [facility] Office Visit from 11/29/2022 in [facility] ED from 07/09/2022 in [facility] Video Visit from 01/26/2022 in [facility]  PHQ-2 Total Score 2 2 3  0 2  PHQ-9 Total Score 7 7 12 22 8    Flowsheet Row ED from 09/07/2023 in [facility] ED from 09/06/2023 in [facility] ED from 09/05/2023 in [facility]  C-SSRS RISK CATEGORY Moderate Risk Moderate Risk Error: Q3, 4, or 5 should not be populated when Q2 is No      Psychiatric Specialty Exam  Presentation  General Appearance: Appropriate for Environment; Disheveled  Eye Contact:Fair  Speech:Clear and Coherent; Garbled  Speech Volume:Decreased  Handedness:Right  Mood and Affect  Mood:Depressed  Affect:Congruent  Thought Process  Thought Processes:Coherent; Linear  Descriptions of Associations:Intact  Orientation:Full (Time, Place and Person)  Thought Content:Logical  Diagnosis of Schizophrenia or Schizoaffective disorder in past: Yes  Duration of Psychotic Symptoms: Greater than six months   Hallucinations: denies  Ideas of Reference:None  Suicidal Thoughts:Suicidal Thoughts: No  Homicidal Thoughts:Homicidal Thoughts: No  Sensorium  Memory:Remote Good  Judgment:Impaired  Insight:Shallow  Psychomotor Activity  Psychomotor Activity:Psychomotor Activity: Tremor  Assets  Assets:Communication Skills; Resilience; Desire for Improvement   Sleep  Sleep:Sleep: Fair  Estimated Sleeping Duration (Last 24 Hours): 13.75-16.75 hours  No data recorded Treatment Plan Summary:   Alcohol Use Disorder EtOH level: <15; LFTs: AST 79, ALT 51; last use date: 6/18 -Ativan  taper  through 6/23             -Although BAL returned WNL, Pt has hx of w/d seizures and DT so pt has higher risk of severe w/d symptoms -CIWA with Ativan  as needed for CIWA greater than 10 -Thiamine  100 mg IM first day and PO after that -Multivitamin with minerals daily   Schizophrenia -Continue home Haldol  5 mg BID    Hypokalemia of 3.2, received 40 meq kcl in ED- repeat BMP if indicated   KANDI JAYSON HAHN, MD 09/08/2023 4:45 PM

## 2023-09-09 DIAGNOSIS — Z5900 Homelessness unspecified: Secondary | ICD-10-CM | POA: Diagnosis not present

## 2023-09-09 DIAGNOSIS — F209 Schizophrenia, unspecified: Secondary | ICD-10-CM | POA: Diagnosis not present

## 2023-09-09 DIAGNOSIS — R251 Tremor, unspecified: Secondary | ICD-10-CM | POA: Diagnosis not present

## 2023-09-09 DIAGNOSIS — F1010 Alcohol abuse, uncomplicated: Secondary | ICD-10-CM | POA: Diagnosis not present

## 2023-09-09 LAB — COMPREHENSIVE METABOLIC PANEL WITH GFR
ALT: 59 U/L — ABNORMAL HIGH (ref 0–44)
AST: 60 U/L — ABNORMAL HIGH (ref 15–41)
Albumin: 3 g/dL — ABNORMAL LOW (ref 3.5–5.0)
Alkaline Phosphatase: 56 U/L (ref 38–126)
Anion gap: 9 (ref 5–15)
BUN: 11 mg/dL (ref 6–20)
CO2: 28 mmol/L (ref 22–32)
Calcium: 9.8 mg/dL (ref 8.9–10.3)
Chloride: 99 mmol/L (ref 98–111)
Creatinine, Ser: 1.16 mg/dL (ref 0.61–1.24)
GFR, Estimated: >60 mL/min (ref >60)
Glucose, Bld: 84 mg/dL (ref 70–99)
Potassium: 4.5 mmol/L (ref 3.5–5.1)
Sodium: 136 mmol/L (ref 135–145)
Total Bilirubin: 0.6 mg/dL (ref 0.0–1.2)
Total Protein: 7.9 g/dL (ref 6.5–8.1)

## 2023-09-09 LAB — VITAMIN B12: Vitamin B-12: 206 pg/mL (ref 180–914)

## 2023-09-09 MED ORDER — ONDANSETRON 4 MG PO TBDP
4.0000 mg | ORAL_TABLET | Freq: Four times a day (QID) | ORAL | Status: DC | PRN
  Administered 2023-09-09: 4 mg via ORAL

## 2023-09-09 NOTE — ED Notes (Signed)
Pt sleeping at this hour. No apparent distress. RR even and unlabored. Monitored for safety.  

## 2023-09-09 NOTE — ED Notes (Signed)
 Pt is in the dayroom watching TV with peers. Pt denies SI/HI/AVH. Pt complains of feeling nauseous and tired.pt accepted to take medication when asked. Pt has no further complain.No acute distress noted.

## 2023-09-09 NOTE — Group Note (Signed)
 Group Topic: Understanding Self  Group Date: 09/09/2023 Start Time: 2015 End Time: 2045 Facilitators: Lenon Lenice SAUNDERS, NT  Department: [facility]  Number of Participants: 6  Group Focus: coping skills Treatment Modality:  Individual Therapy Interventions utilized were group exercise Purpose: express feelings  Name: Jerry [name] Date of Birth: 1976/03/15  MR: [id number]    Level of Participation: pt did not attend group Quality of Participation: pt did not attend group Interactions with others: pt did not attend group Mood/Affect: pt did not attend group Triggers (if applicable): pt did not attend group Cognition: pt did not attend group Progress: pt did not attend group Response: pt did not attend group Plan: pt did not attend group  Patients Problems:  Patient Active Problem List   Diagnosis Date Noted   Alcohol use disorder 09/07/2023   Cocaine use disorder, moderate, dependence (HCC) 09/04/2023   Alcohol use disorder, moderate, dependence (HCC) 09/04/2023   Protein-calorie malnutrition, severe 07/03/2023   Left arm cellulitis 07/01/2023   Jaw pain 11/29/2022   Need for influenza vaccination 11/29/2022   Erectile dysfunction 11/29/2022   Hyponatremia 11/29/2022   History of hepatitis C virus infection 11/29/2022   Tardive dyskinesia 10/24/2022   Generalized anxiety disorder 10/24/2022   PTSD (post-traumatic stress disorder) 10/24/2022   Healthcare maintenance 10/24/2022   Alcohol withdrawal (HCC) 07/22/2022   Schizoaffective disorder, bipolar type (HCC) 07/09/2022   Schizophrenia (HCC) 07/07/2022   Auditory hallucination    Malingering 09/07/2020   Homelessness 09/07/2020   Major depressive disorder, recurrent severe without psychotic features (HCC) 07/17/2020   Schizophrenia spectrum disorder with psychotic disorder type not yet determined (HCC) 07/17/2020   Methamphetamine abuse (HCC) 07/17/2020   Marijuana abuse 07/17/2020    Cocaine abuse (HCC) 07/17/2020   Anxiety and depression 06/25/2014   Family history of diabetes mellitus (DM) 06/25/2014   Family history of thyroid  disease 06/25/2014   Tobacco use disorder 06/25/2014   Alcohol abuse 05/18/2013   Polysubstance abuse (HCC) 05/18/2013   Depression, unspecified 05/18/2013

## 2023-09-09 NOTE — Group Note (Signed)
 Group Topic: Relapse and Recovery  Group Date: 09/09/2023 Start Time: 1215 End Time: 1230 Facilitators: Stanly Stabile, RN  Department: [facility]  Number of Participants: 8  Group Focus: chemical dependency education and chemical dependency issues Treatment Modality:  Behavior Modification Therapy Interventions utilized were clarification, patient education, and problem solving Purpose: express feelings, express irrational fears, improve communication skills, and increase insight  Name: Jerry [name] Date of Birth: 06/15/1975  MR: [id number]    Level of Participation: minimal Quality of Participation: attentive Interactions with others: gave feedback Mood/Affect: appropriate Triggers (if applicable):   Cognition: coherent/clear Progress: Gaining insight Response:   Plan: follow-up needed  Patients Problems:  Patient Active Problem List   Diagnosis Date Noted   Alcohol use disorder 09/07/2023   Cocaine use disorder, moderate, dependence (HCC) 09/04/2023   Alcohol use disorder, moderate, dependence (HCC) 09/04/2023   Protein-calorie malnutrition, severe 07/03/2023   Left arm cellulitis 07/01/2023   Jaw pain 11/29/2022   Need for influenza vaccination 11/29/2022   Erectile dysfunction 11/29/2022   Hyponatremia 11/29/2022   History of hepatitis C virus infection 11/29/2022   Tardive dyskinesia 10/24/2022   Generalized anxiety disorder 10/24/2022   PTSD (post-traumatic stress disorder) 10/24/2022   Healthcare maintenance 10/24/2022   Alcohol withdrawal (HCC) 07/22/2022   Schizoaffective disorder, bipolar type (HCC) 07/09/2022   Schizophrenia (HCC) 07/07/2022   Auditory hallucination    Malingering 09/07/2020   Homelessness 09/07/2020   Major depressive disorder, recurrent severe without psychotic features (HCC) 07/17/2020   Schizophrenia spectrum disorder with psychotic disorder type not yet determined (HCC) 07/17/2020   Methamphetamine  abuse (HCC) 07/17/2020   Marijuana abuse 07/17/2020   Cocaine abuse (HCC) 07/17/2020   Anxiety and depression 06/25/2014   Family history of diabetes mellitus (DM) 06/25/2014   Family history of thyroid  disease 06/25/2014   Tobacco use disorder 06/25/2014   Alcohol abuse 05/18/2013   Polysubstance abuse (HCC) 05/18/2013   Depression, unspecified 05/18/2013

## 2023-09-09 NOTE — ED Notes (Signed)
 Environmental check completed, no contraband found in bedroom.

## 2023-09-09 NOTE — ED Notes (Signed)
 Pt complained of feeling nauseous

## 2023-09-09 NOTE — Group Note (Signed)
 Group Topic: Positive Affirmations  Group Date: 09/09/2023 Start Time: 1125 End Time: 1150 Facilitators: Celinda Suzen NOVAK, NT  Department: [facility]  Number of Participants: 9  Group Focus: self-esteem Treatment Modality:  Psychoeducation Interventions utilized were support Purpose: increase insight and regain self-worth  Name: Jerry [name] Date of Birth: 1975/10/30  MR: [id number]    Level of Participation: PT did not attend group Quality of Participation: No participation Interactions with others: No interaction Mood/Affect: n/a Triggers (if applicable): n/a Cognition: coherent/clear Progress: Other Response: n/a Plan: follow-up needed and patient will be encouraged to attend group  Patients Problems:  Patient Active Problem List   Diagnosis Date Noted   Alcohol use disorder 09/07/2023   Cocaine use disorder, moderate, dependence (HCC) 09/04/2023   Alcohol use disorder, moderate, dependence (HCC) 09/04/2023   Protein-calorie malnutrition, severe 07/03/2023   Left arm cellulitis 07/01/2023   Jaw pain 11/29/2022   Need for influenza vaccination 11/29/2022   Erectile dysfunction 11/29/2022   Hyponatremia 11/29/2022   History of hepatitis C virus infection 11/29/2022   Tardive dyskinesia 10/24/2022   Generalized anxiety disorder 10/24/2022   PTSD (post-traumatic stress disorder) 10/24/2022   Healthcare maintenance 10/24/2022   Alcohol withdrawal (HCC) 07/22/2022   Schizoaffective disorder, bipolar type (HCC) 07/09/2022   Schizophrenia (HCC) 07/07/2022   Auditory hallucination    Malingering 09/07/2020   Homelessness 09/07/2020   Major depressive disorder, recurrent severe without psychotic features (HCC) 07/17/2020   Schizophrenia spectrum disorder with psychotic disorder type not yet determined (HCC) 07/17/2020   Methamphetamine abuse (HCC) 07/17/2020   Marijuana abuse 07/17/2020   Cocaine abuse (HCC) 07/17/2020   Anxiety and  depression 06/25/2014   Family history of diabetes mellitus (DM) 06/25/2014   Family history of thyroid  disease 06/25/2014   Tobacco use disorder 06/25/2014   Alcohol abuse 05/18/2013   Polysubstance abuse (HCC) 05/18/2013   Depression, unspecified 05/18/2013

## 2023-09-09 NOTE — ED Provider Notes (Signed)
 Behavioral Health Progress Note  Date and Time: 09/09/2023 12:03 PM Name: Jerry [name] MRN:  [id number]  Subjective:  Jerry [name] 48 y.o., male with a PPHx of Schizophrenia, Alcohol Use Disorder and Polysubstance use disorder that presented [facility] voluntarily with complaints of needing to detox from ETOH, hallucinations and rehab services. Jerry [name], is seen face to face by this provider, consulted with Dr. Cole; and chart reviewed on 09/09/23.    On evaluation Jerry [name] reports feeling better today but is still somewhat tremulous and nauseated related to withdrawal from alcohol. He reports feeling very bad last night and was given PRN meds for withdrawal symptoms. He has not required any PRN medications as of today. Prior to admission, pt was drinking vodka and using crack cocaine daily. He denies current suicidal ideations, homicidal ideations and visual hallucinations.  He continues to endorse having auditory hallucinations which appear to be chronic, they are not command in nature at this time.  He reports sleeping well last night and having good appetite.  He has been compliant with medication regimen and denies any concerns or negative side effects.  He inquires about discharging tomorrow to an outpatient treatment program instead of residential.  We discussed the increased risks of relapsing and experiencing withdrawal symptoms again.  He continues to prefer outpatient treatment versus a residential program.   During evaluation Jerry [name] is lying down in bed in his room, in no acute distress.  He is alert & oriented x 4, calm, cooperative and attentive for this assessment.  His mood is dysphoric with congruent affect.  He has normal speech, and behavior.  Objectively there is no evidence of psychosis/mania or delusional thinking. Pt does not appear to be responding to internal or external stimuli.  Patient is able to converse coherently, goal directed thoughts, no distractibility, or  pre-occupation.  He currently denies suicidal/self-harm/homicidal ideations, psychosis, and paranoia.  Patient answered assessment question appropriately.  CIWA score today by nursing staff was 4 for auditory hallucinations and tremors.  Diagnosis:  Final diagnoses:  Alcohol use disorder  Polysubstance abuse (HCC)  Hallucinations    Total Time spent with patient: 20 minutes  Past Psychiatric History: Dx: schizophrenia, AUD, stimulant use d/o cocaine type 6 IP psych hospitalizations in 2025 for paranoia and alcohol use History of w/d seizures, reporting most recently 3 years ago History of DT, reporting most recently one year ago Denies previous suicide attempts Past Medical History: none reported Family History:  Family History  Problem Relation Age of Onset   Heart disease Mother    Cancer Mother    Lung cancer Mother    Heart disease Father    Diabetes Father    Diabetes Maternal Grandmother    Stroke Paternal Grandfather     Family Psychiatric History: Polysubstance use Social History: Pt homeless and unemployed.   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, Armed forces operational officer, Personal relationships, Work / School Withdrawal Symptoms: Agitation, Tingling, Patient aware of relationship between substance abuse and physical/medical complications, Fever / Chills, Tremors, Sweats, Irritability, Weakness, Blackouts, Diarrhea, Nausea / Vomiting (Currently reporting withdrawal with trembling, nausea, naxiety) Name of Substance 1: alcohol 1 - Age of First Use: teen 1 - Amount (size/oz): unknown 1 - Frequency: was daily but pt stated he has not had any alcohol in over 24 hours 1 - Duration: ongoing 1 -  Last Use / Amount: 09/05/23 1 - Method of Aquiring: purchase 1- Route of Use: oral Name of Substance 2: Hx of meth use 2 - Age of First Use: unknown 2 - Amount  (size/oz): unknown 2 - Frequency: stated he stopped a few weeks ago 2 - Duration: stated he stopped a few weeks ago 2 - Last Use / Amount: unknown 2 - Method of Aquiring: unknown 2 - Route of Substance Use: unknown Name of Substance 3: Hx of cannabis use 3 - Age of First Use: teen 3 - Amount (size/oz): unknown 3 - Frequency: daily until a few weeks ago per pt 3 - Duration: stated he stopped a few weeks ago 3 - Last Use / Amount: unknown 3 - Method of Aquiring: unknown 3 - Route of Substance Use: smoke                      Sleep: Good  Appetite:  Good  Current Medications:  Current Facility-Administered Medications  Medication Dose Route Frequency Provider Last Rate Last Admin   acetaminophen  (TYLENOL ) tablet 650 mg  650 mg Oral Q6H PRN Hoang, Daniela B, MD   650 mg at 09/08/23 2115   alum & mag hydroxide-simeth (MAALOX/MYLANTA) 200-200-20 MG/5ML suspension 30 mL  30 mL Oral Q4H PRN Hoang, Daniela B, MD       haloperidol  (HALDOL ) tablet 5 mg  5 mg Oral TID PRN Hoang, Daniela B, MD       And   diphenhydrAMINE  (BENADRYL ) capsule 50 mg  50 mg Oral TID PRN Hoang, Daniela B, MD       haloperidol  lactate (HALDOL ) injection 5 mg  5 mg Intramuscular TID PRN Hoang, Daniela B, MD       And   diphenhydrAMINE  (BENADRYL ) injection 50 mg  50 mg Intramuscular TID PRN Hoang, Daniela B, MD       And   LORazepam  (ATIVAN ) injection 2 mg  2 mg Intramuscular TID PRN Hoang, Daniela B, MD       haloperidol  lactate (HALDOL ) injection 10 mg  10 mg Intramuscular TID PRN Hoang, Daniela B, MD       And   diphenhydrAMINE  (BENADRYL ) injection 50 mg  50 mg Intramuscular TID PRN Hoang, Daniela B, MD       And   LORazepam  (ATIVAN ) injection 2 mg  2 mg Intramuscular TID PRN Hoang, Daniela B, MD       haloperidol  (HALDOL ) tablet 5 mg  5 mg Oral BID Hoang, Daniela B, MD   5 mg at 09/09/23 0933   hydrOXYzine  (ATARAX ) tablet 25 mg  25 mg Oral TID PRN Hoang, Daniela B, MD   25 mg at 09/08/23 2115    loperamide  (IMODIUM ) capsule 2-4 mg  2-4 mg Oral PRN Hoang, Daniela B, MD       LORazepam  (ATIVAN ) tablet 1 mg  1 mg Oral Q6H PRN Hoang, Daniela B, MD   1 mg at 09/07/23 1451   LORazepam  (ATIVAN ) tablet 1 mg  1 mg Oral BID Hoang, Daniela B, MD   1 mg at 09/09/23 9066   Followed by   NOREEN ON 09/10/2023] LORazepam  (ATIVAN ) tablet 1 mg  1 mg Oral Daily Hoang, Daniela B, MD       magnesium  hydroxide (MILK OF MAGNESIA) suspension 30 mL  30 mL Oral Daily PRN Hoang, Daniela B, MD       multivitamin with minerals tablet 1 tablet  1 tablet Oral Daily Hoang, Daniela B, MD  1 tablet at 09/09/23 0933   ondansetron  (ZOFRAN -ODT) disintegrating tablet 4 mg  4 mg Oral Q6H PRN Hoang, Daniela B, MD   4 mg at 09/08/23 2115   thiamine  (VITAMIN B1) tablet 100 mg  100 mg Oral Daily Hoang, Daniela B, MD   100 mg at 09/09/23 0933   traZODone  (DESYREL ) tablet 50 mg  50 mg Oral QHS PRN Hoang, Daniela B, MD   50 mg at 09/08/23 2115   Current Outpatient Medications  Medication Sig Dispense Refill   haloperidol  (HALDOL ) 5 MG tablet Take 1 tablet (5 mg total) by mouth 2 (two) times daily. (Patient not taking: Reported on 08/06/2023) 60 tablet 0   hydrOXYzine  (ATARAX ) 25 MG tablet Take 1 tablet (25 mg total) by mouth 3 (three) times daily as needed for anxiety. (Patient not taking: Reported on 08/06/2023) 30 tablet 0   thiamine  (VITAMIN B-1) 100 MG tablet Take 1 tablet (100 mg total) by mouth daily. (Patient not taking: Reported on 08/06/2023) 30 tablet 0   traZODone  (DESYREL ) 50 MG tablet Take 1 tablet (50 mg total) by mouth at bedtime as needed for sleep. (Patient not taking: Reported on 08/06/2023) 30 tablet 0    Labs  Lab Results:  Admission on 09/06/2023, Discharged on 09/07/2023  Component Date Value Ref Range Status   POC Amphetamine UR 09/06/2023 None Detected  NONE DETECTED (Cut Off Level 1000 ng/mL) Final   POC Secobarbital (BAR) 09/06/2023 None Detected  NONE DETECTED (Cut Off Level 300 ng/mL) Final   POC  Buprenorphine (BUP) 09/06/2023 None Detected  NONE DETECTED (Cut Off Level 10 ng/mL) Final   POC Oxazepam (BZO) 09/06/2023 Positive (A)  NONE DETECTED (Cut Off Level 300 ng/mL) Final   POC Cocaine UR 09/06/2023 Positive (A)  NONE DETECTED (Cut Off Level 300 ng/mL) Final   POC Methamphetamine UR 09/06/2023 None Detected  NONE DETECTED (Cut Off Level 1000 ng/mL) Final   POC Morphine 09/06/2023 None Detected  NONE DETECTED (Cut Off Level 300 ng/mL) Final   POC Methadone UR 09/06/2023 None Detected  NONE DETECTED (Cut Off Level 300 ng/mL) Final   POC Oxycodone  UR 09/06/2023 None Detected  NONE DETECTED (Cut Off Level 100 ng/mL) Final   POC Marijuana UR 09/06/2023 Positive (A)  NONE DETECTED (Cut Off Level 50 ng/mL) Final  Admission on 09/05/2023, Discharged on 09/05/2023  Component Date Value Ref Range Status   Sodium 09/05/2023 134 (L)  135 - 145 mmol/L Final   Potassium 09/05/2023 3.2 (L)  3.5 - 5.1 mmol/L Final   Chloride 09/05/2023 100  98 - 111 mmol/L Final   CO2 09/05/2023 24  22 - 32 mmol/L Final   Glucose, Bld 09/05/2023 117 (H)  70 - 99 mg/dL Final   Glucose reference range applies only to samples taken after fasting for at least 8 hours.   BUN 09/05/2023 7  6 - 20 mg/dL Final   Creatinine, Ser 09/05/2023 0.77  0.61 - 1.24 mg/dL Final   Calcium 93/81/7974 9.0  8.9 - 10.3 mg/dL Final   Total Protein 93/81/7974 8.2 (H)  6.5 - 8.1 g/dL Final   Albumin 93/81/7974 3.2 (L)  3.5 - 5.0 g/dL Final   AST 93/81/7974 79 (H)  15 - 41 U/L Final   ALT 09/05/2023 51 (H)  0 - 44 U/L Final   Alkaline Phosphatase 09/05/2023 68  38 - 126 U/L Final   Total Bilirubin 09/05/2023 1.1  0.0 - 1.2 mg/dL Final   GFR, Estimated 09/05/2023 >60  >60  mL/min Final   Comment: (NOTE) Calculated using the CKD-EPI Creatinine Equation (2021)    Anion gap 09/05/2023 10  5 - 15 Final   Performed at [facility], [address], [place], [place] [address]   WBC 09/05/2023 9.6  4.0 - 10.5 K/uL Final    RBC 09/05/2023 4.24  4.22 - 5.81 MIL/uL Final   Hemoglobin 09/05/2023 13.9  13.0 - 17.0 g/dL Final   HCT 93/81/7974 41.2  39.0 - 52.0 % Final   MCV 09/05/2023 97.2  80.0 - 100.0 fL Final   MCH 09/05/2023 32.8  26.0 - 34.0 pg Final   MCHC 09/05/2023 33.7  30.0 - 36.0 g/dL Final   RDW 93/81/7974 13.3  11.5 - 15.5 % Final   Platelets 09/05/2023 305  150 - 400 K/uL Final   nRBC 09/05/2023 0.0  0.0 - 0.2 % Final   Performed at [facility], [address] [place], [place] [address]  Admission on 09/04/2023, Discharged on 09/05/2023  Component Date Value Ref Range Status   Sodium 09/04/2023 128 (L)  135 - 145 mmol/L Final   Potassium 09/04/2023 3.0 (L)  3.5 - 5.1 mmol/L Final   Chloride 09/04/2023 97 (L)  98 - 111 mmol/L Final   CO2 09/04/2023 21 (L)  22 - 32 mmol/L Final   Glucose, Bld 09/04/2023 132 (H)  70 - 99 mg/dL Final   Glucose reference range applies only to samples taken after fasting for at least 8 hours.   BUN 09/04/2023 8  6 - 20 mg/dL Final   Creatinine, Ser 09/04/2023 0.86  0.61 - 1.24 mg/dL Final   Calcium 93/82/7974 8.1 (L)  8.9 - 10.3 mg/dL Final   Total Protein 93/82/7974 7.9  6.5 - 8.1 g/dL Final   Albumin 93/82/7974 3.2 (L)  3.5 - 5.0 g/dL Final   AST 93/82/7974 74 (H)  15 - 41 U/L Final   ALT 09/04/2023 48 (H)  0 - 44 U/L Final   Alkaline Phosphatase 09/04/2023 66  38 - 126 U/L Final   Total Bilirubin 09/04/2023 0.7  0.0 - 1.2 mg/dL Final   GFR, Estimated 09/04/2023 >60  >60 mL/min Final   Comment: (NOTE) Calculated using the CKD-EPI Creatinine Equation (2021)    Anion gap 09/04/2023 10  5 - 15 Final   Performed at [facility], [address] [place], [place] [address]   Alcohol, Ethyl (B) 09/04/2023 <15  <15 mg/dL Final   Comment: (NOTE) For medical purposes only. Performed at [facility], [address] [place], [place] [address]    Opiates 09/04/2023 NONE DETECTED  NONE DETECTED Final   Cocaine  09/04/2023 POSITIVE (A)  NONE DETECTED Final   Benzodiazepines 09/04/2023 NONE DETECTED  NONE DETECTED Final   Amphetamines 09/04/2023 NONE DETECTED  NONE DETECTED Final   Tetrahydrocannabinol 09/04/2023 NONE DETECTED  NONE DETECTED Final   Barbiturates 09/04/2023 NONE DETECTED  NONE DETECTED Final   Comment: (NOTE) DRUG SCREEN FOR MEDICAL PURPOSES ONLY.  IF CONFIRMATION IS NEEDED FOR ANY PURPOSE, NOTIFY LAB WITHIN 5 DAYS.  LOWEST DETECTABLE LIMITS FOR URINE DRUG SCREEN Drug Class                     Cutoff (ng/mL) Amphetamine and metabolites    1000 Barbiturate and metabolites    200 Benzodiazepine                 200 Opiates and metabolites  300 Cocaine and metabolites        300 THC                            50 Performed at [facility], [address], [place], [place] [address]    WBC 09/04/2023 10.4  4.0 - 10.5 K/uL Final   RBC 09/04/2023 4.00 (L)  4.22 - 5.81 MIL/uL Final   Hemoglobin 09/04/2023 13.2  13.0 - 17.0 g/dL Final   HCT 93/82/7974 38.8 (L)  39.0 - 52.0 % Final   MCV 09/04/2023 97.0  80.0 - 100.0 fL Final   MCH 09/04/2023 33.0  26.0 - 34.0 pg Final   MCHC 09/04/2023 34.0  30.0 - 36.0 g/dL Final   RDW 93/82/7974 13.2  11.5 - 15.5 % Final   Platelets 09/04/2023 276  150 - 400 K/uL Final   nRBC 09/04/2023 0.0  0.0 - 0.2 % Final   Neutrophils Relative % 09/04/2023 83  % Final   Neutro Abs 09/04/2023 8.6 (H)  1.7 - 7.7 K/uL Final   Lymphocytes Relative 09/04/2023 7  % Final   Lymphs Abs 09/04/2023 0.7  0.7 - 4.0 K/uL Final   Monocytes Relative 09/04/2023 10  % Final   Monocytes Absolute 09/04/2023 1.1 (H)  0.1 - 1.0 K/uL Final   Eosinophils Relative 09/04/2023 0  % Final   Eosinophils Absolute 09/04/2023 0.0  0.0 - 0.5 K/uL Final   Basophils Relative 09/04/2023 0  % Final   Basophils Absolute 09/04/2023 0.0  0.0 - 0.1 K/uL Final   Immature Granulocytes 09/04/2023 0  % Final   Abs Immature Granulocytes 09/04/2023 0.03  0.00 - 0.07 K/uL  Final   Performed at [facility], [address], [place], [place] [address]   Potassium 09/05/2023 3.6  3.5 - 5.1 mmol/L Final   Performed at [facility], [address], [place], [place] [address]  Admission on 08/04/2023, Discharged on 08/07/2023  Component Date Value Ref Range Status   Sodium 08/04/2023 132 (L)  135 - 145 mmol/L Final   Potassium 08/04/2023 4.0  3.5 - 5.1 mmol/L Final   Chloride 08/04/2023 99  98 - 111 mmol/L Final   CO2 08/04/2023 25  22 - 32 mmol/L Final   Glucose, Bld 08/04/2023 100 (H)  70 - 99 mg/dL Final   Glucose reference range applies only to samples taken after fasting for at least 8 hours.   BUN 08/04/2023 10  6 - 20 mg/dL Final   Creatinine, Ser 08/04/2023 0.98  0.61 - 1.24 mg/dL Final   Calcium 94/82/7974 9.6  8.9 - 10.3 mg/dL Final   Total Protein 94/82/7974 9.1 (H)  6.5 - 8.1 g/dL Final   Albumin 94/82/7974 4.1  3.5 - 5.0 g/dL Final   AST 94/82/7974 32  15 - 41 U/L Final   ALT 08/04/2023 23  0 - 44 U/L Final   Alkaline Phosphatase 08/04/2023 59  38 - 126 U/L Final   Total Bilirubin 08/04/2023 1.1  0.0 - 1.2 mg/dL Final   GFR, Estimated 08/04/2023 >60  >60 mL/min Final   Comment: (NOTE) Calculated using the CKD-EPI Creatinine Equation (2021)    Anion gap 08/04/2023 8  5 - 15 Final   Performed at [facility], [address] [place], [place] [address]   Alcohol, Ethyl (B) 08/04/2023 <15  <15 mg/dL Final   Comment: Please note change in reference range. (  NOTE) For medical purposes only. Performed at [facility], [address] [place], [place] [address]    Opiates 08/04/2023 NONE DETECTED  NONE DETECTED Final   Cocaine 08/04/2023 POSITIVE (A)  NONE DETECTED Final   Benzodiazepines 08/04/2023 POSITIVE (A)  NONE DETECTED Final   Amphetamines 08/04/2023 POSITIVE (A)  NONE DETECTED Final   Comment: (NOTE) Trazodone  is metabolized in vivo to several metabolites,  including pharmacologically active m-CPP, which is excreted in the urine. Immunoassay screens for amphetamines and MDMA have potential cross-reactivity with these compounds and may provide false positive  results.     Tetrahydrocannabinol 08/04/2023 NONE DETECTED  NONE DETECTED Final   Barbiturates 08/04/2023 NONE DETECTED  NONE DETECTED Final   Comment: (NOTE) DRUG SCREEN FOR MEDICAL PURPOSES ONLY.  IF CONFIRMATION IS NEEDED FOR ANY PURPOSE, NOTIFY LAB WITHIN 5 DAYS.  LOWEST DETECTABLE LIMITS FOR URINE DRUG SCREEN Drug Class                     Cutoff (ng/mL) Amphetamine and metabolites    1000 Barbiturate and metabolites    200 Benzodiazepine                 200 Opiates and metabolites        300 Cocaine and metabolites        300 THC                            50 Performed at [facility], [address] [place], [place] [address]    WBC 08/04/2023 7.4  4.0 - 10.5 K/uL Final   RBC 08/04/2023 4.07 (L)  4.22 - 5.81 MIL/uL Final   Hemoglobin 08/04/2023 13.5  13.0 - 17.0 g/dL Final   HCT 94/82/7974 40.7  39.0 - 52.0 % Final   MCV 08/04/2023 100.0  80.0 - 100.0 fL Final   MCH 08/04/2023 33.2  26.0 - 34.0 pg Final   MCHC 08/04/2023 33.2  30.0 - 36.0 g/dL Final   RDW 94/82/7974 13.3  11.5 - 15.5 % Final   Platelets 08/04/2023 329  150 - 400 K/uL Final   nRBC 08/04/2023 0.0  0.0 - 0.2 % Final   Neutrophils Relative % 08/04/2023 69  % Final   Neutro Abs 08/04/2023 5.1  1.7 - 7.7 K/uL Final   Lymphocytes Relative 08/04/2023 19  % Final   Lymphs Abs 08/04/2023 1.4  0.7 - 4.0 K/uL Final   Monocytes Relative 08/04/2023 10  % Final   Monocytes Absolute 08/04/2023 0.8  0.1 - 1.0 K/uL Final   Eosinophils Relative 08/04/2023 1  % Final   Eosinophils Absolute 08/04/2023 0.1  0.0 - 0.5 K/uL Final   Basophils Relative 08/04/2023 0  % Final   Basophils Absolute 08/04/2023 0.0  0.0 - 0.1 K/uL Final   Immature Granulocytes 08/04/2023 1  % Final   Abs Immature  Granulocytes 08/04/2023 0.04  0.00 - 0.07 K/uL Final   Performed at [facility], [address] [place], [place] [address]  Admission on 07/14/2023, Discharged on 07/19/2023  Component Date Value Ref Range Status   Sodium 07/19/2023 131 (L)  135 - 145 mmol/L Final   Potassium 07/19/2023 4.3  3.5 - 5.1 mmol/L Final   Chloride 07/19/2023 98  98 - 111 mmol/L Final   CO2 07/19/2023 23  22 - 32 mmol/L Final   Glucose, Bld 07/19/2023 104 (H)  70 - 99  mg/dL Final   Glucose reference range applies only to samples taken after fasting for at least 8 hours.   BUN 07/19/2023 16  6 - 20 mg/dL Final   Creatinine, Ser 07/19/2023 0.76  0.61 - 1.24 mg/dL Final   Calcium 94/98/7974 9.2  8.9 - 10.3 mg/dL Final   Total Protein 94/98/7974 8.5 (H)  6.5 - 8.1 g/dL Final   Albumin 94/98/7974 3.5  3.5 - 5.0 g/dL Final   AST 94/98/7974 30  15 - 41 U/L Final   ALT 07/19/2023 34  0 - 44 U/L Final   Alkaline Phosphatase 07/19/2023 51  38 - 126 U/L Final   Total Bilirubin 07/19/2023 0.6  0.0 - 1.2 mg/dL Final   GFR, Estimated 07/19/2023 >60  >60 mL/min Final   Comment: (NOTE) Calculated using the CKD-EPI Creatinine Equation (2021)    Anion gap 07/19/2023 10  5 - 15 Final   Performed at [facility], [address] [place], [place] [address]   Hgb A1c MFr Bld 07/19/2023 4.8  4.8 - 5.6 % Final   Comment: (NOTE) Pre diabetes:          5.7%-6.4%  Diabetes:              >6.4%  Glycemic control for   <7.0% adults with diabetes    Mean Plasma Glucose 07/19/2023 91.06  mg/dL Final   Performed at [facility], [address], [place], [place] [address]   Cholesterol 07/19/2023 168  0 - 200 mg/dL Final   Triglycerides 94/98/7974 120  <150 mg/dL Final   HDL 94/98/7974 42  >40 mg/dL Final   Total CHOL/HDL Ratio 07/19/2023 4.0  RATIO Final   VLDL 07/19/2023 24  0 - 40 mg/dL Final   LDL Cholesterol 07/19/2023 102 (H)  0 - 99 mg/dL Final   Comment:        Total  Cholesterol/HDL:CHD Risk Coronary Heart Disease Risk Table                     Men   Women  1/2 Average Risk   3.4   3.3  Average Risk       5.0   4.4  2 X Average Risk   9.6   7.1  3 X Average Risk  23.4   11.0        Use the calculated Patient Ratio above and the CHD Risk Table to determine the patient's CHD Risk.        ATP III CLASSIFICATION (LDL):  <100     mg/dL   Optimal  899-870  mg/dL   Near or Above                    Optimal  130-159  mg/dL   Borderline  839-810  mg/dL   High  >809     mg/dL   Very High Performed at [facility], [address] [place], [place] [address]    RPR Ser Ql 07/19/2023 NON REACTIVE  NON REACTIVE Final   Performed at [facility], [address] [place], [place] [address]   TSH 07/19/2023 1.931  0.350 - 4.500 uIU/mL Final   Comment: Performed by a 3rd Generation assay with a functional sensitivity of <=0.01 uIU/mL. Performed at [facility], [address], [place], [place] [address]    Vit D, 25-Hydroxy 07/19/2023 32.02  30 - 100 ng/mL Final   Comment: (  NOTE) Vitamin D  deficiency has been defined by the Institute of Medicine  and an Endocrine Society practice guideline as a level of serum 25-OH  vitamin D  less than 20 ng/mL (1,2). The Endocrine Society went on to  further define vitamin D  insufficiency as a level between 21 and 29  ng/mL (2).  1. IOM (Institute of Medicine). 2010. Dietary reference intakes for  calcium and D. [place]: [employer]. 2. Holick MF, Binkley [place], Bischoff-Ferrari HA, et al. Evaluation,  treatment, and prevention of vitamin D  deficiency: an Endocrine  Society clinical practice guideline, JCEM. 2011 Jul; 96(7): 1911-30.  Performed at [facility], [address], [place], [place] [address]    Vitamin B-12 07/19/2023 269  180 - 914 pg/mL Final   Comment: (NOTE) This assay is not validated for testing neonatal or myeloproliferative  syndrome specimens for Vitamin B12 levels. Performed at [facility], [address] [place], [place] [address]    HIV Screen 4th Generation wRfx 07/19/2023 Non Reactive  Non Reactive Final   Performed at [facility], [address], [place], [place] [address]  Admission on 07/13/2023, Discharged on 07/14/2023  Component Date Value Ref Range Status   WBC 07/13/2023 8.4  4.0 - 10.5 K/uL Final   RBC 07/13/2023 3.81 (L)  4.22 - 5.81 MIL/uL Final   Hemoglobin 07/13/2023 12.6 (L)  13.0 - 17.0 g/dL Final   HCT 95/74/7974 37.8 (L)  39.0 - 52.0 % Final   MCV 07/13/2023 99.2  80.0 - 100.0 fL Final   MCH 07/13/2023 33.1  26.0 - 34.0 pg Final   MCHC 07/13/2023 33.3  30.0 - 36.0 g/dL Final   RDW 95/74/7974 13.2  11.5 - 15.5 % Final   Platelets 07/13/2023 418 (H)  150 - 400 K/uL Final   REPEATED TO VERIFY   nRBC 07/13/2023 0.0  0.0 - 0.2 % Final   Neutrophils Relative % 07/13/2023 55  % Final   Neutro Abs 07/13/2023 4.7  1.7 - 7.7 K/uL Final   Lymphocytes Relative 07/13/2023 25  % Final   Lymphs Abs 07/13/2023 2.1  0.7 - 4.0 K/uL Final   Monocytes Relative 07/13/2023 12  % Final   Monocytes Absolute 07/13/2023 1.0  0.1 - 1.0 K/uL Final   Eosinophils Relative 07/13/2023 7  % Final   Eosinophils Absolute 07/13/2023 0.6 (H)  0.0 - 0.5 K/uL Final   Basophils Relative 07/13/2023 1  % Final   Basophils Absolute 07/13/2023 0.1  0.0 - 0.1 K/uL Final   Immature Granulocytes 07/13/2023 0  % Final   Abs Immature Granulocytes 07/13/2023 0.02  0.00 - 0.07 K/uL Final   Performed at [facility], [address], [place], [place] [address]   Sodium 07/13/2023 131 (L)  135 - 145 mmol/L Final   Potassium 07/13/2023 3.9  3.5 - 5.1 mmol/L Final   Chloride 07/13/2023 99  98 - 111 mmol/L Final   CO2 07/13/2023 23  22 - 32 mmol/L Final   Glucose, Bld 07/13/2023 105 (H)  70 - 99 mg/dL Final   Glucose reference range applies only to samples taken after fasting for at  least 8 hours.   BUN 07/13/2023 12  6 - 20 mg/dL Final   Creatinine, Ser 07/13/2023 1.06  0.61 - 1.24 mg/dL Final   Calcium 95/74/7974 8.9  8.9 - 10.3 mg/dL Final   Total Protein 95/74/7974 8.1  6.5 - 8.1 g/dL Final   Albumin 95/74/7974  3.3 (L)  3.5 - 5.0 g/dL Final   AST 95/74/7974 41  15 - 41 U/L Final   ALT 07/13/2023 45 (H)  0 - 44 U/L Final   Alkaline Phosphatase 07/13/2023 64  38 - 126 U/L Final   Total Bilirubin 07/13/2023 0.8  0.0 - 1.2 mg/dL Final   GFR, Estimated 07/13/2023 >60  >60 mL/min Final   Comment: (NOTE) Calculated using the CKD-EPI Creatinine Equation (2021)    Anion gap 07/13/2023 9  5 - 15 Final   Performed at [facility] [facility], [address], [place], [place] [address]   Alcohol, Ethyl (B) 07/13/2023 <15  <15 mg/dL Final   Comment: Please note change in reference range. (NOTE) For medical purposes only. Performed at [facility], [address], [place], [place] [address]    Opiates 07/13/2023 NONE DETECTED  NONE DETECTED Final   Cocaine 07/13/2023 NONE DETECTED  NONE DETECTED Final   Benzodiazepines 07/13/2023 POSITIVE (A)  NONE DETECTED Final   Amphetamines 07/13/2023 POSITIVE (A)  NONE DETECTED Final   Comment: (NOTE) Trazodone  is metabolized in vivo to several metabolites, including pharmacologically active m-CPP, which is excreted in the urine. Immunoassay screens for amphetamines and MDMA have potential cross-reactivity with these compounds and may provide false positive  results.     Tetrahydrocannabinol 07/13/2023 POSITIVE (A)  NONE DETECTED Final   Barbiturates 07/13/2023 POSITIVE (A)  NONE DETECTED Final   Comment: (NOTE) DRUG SCREEN FOR MEDICAL PURPOSES ONLY.  IF CONFIRMATION IS NEEDED FOR ANY PURPOSE, NOTIFY LAB WITHIN 5 DAYS.  LOWEST DETECTABLE LIMITS FOR URINE DRUG SCREEN Drug Class                     Cutoff (ng/mL) Amphetamine and metabolites    1000 Barbiturate and metabolites     200 Benzodiazepine                 200 Opiates and metabolites        300 Cocaine and metabolites        300 THC                            50 Performed at [facility], [address] [place], [place] [address]   Admission on 07/11/2023, Discharged on 07/12/2023  Component Date Value Ref Range Status   Sodium 07/11/2023 134 (L)  135 - 145 mmol/L Final   Potassium 07/11/2023 3.7  3.5 - 5.1 mmol/L Final   Chloride 07/11/2023 100  98 - 111 mmol/L Final   CO2 07/11/2023 22  22 - 32 mmol/L Final   Glucose, Bld 07/11/2023 106 (H)  70 - 99 mg/dL Final   Glucose reference range applies only to samples taken after fasting for at least 8 hours.   BUN 07/11/2023 10  6 - 20 mg/dL Final   Creatinine, Ser 07/11/2023 0.90  0.61 - 1.24 mg/dL Final   Calcium 95/76/7974 9.2  8.9 - 10.3 mg/dL Final   Total Protein 95/76/7974 8.9 (H)  6.5 - 8.1 g/dL Final   Albumin 95/76/7974 3.5  3.5 - 5.0 g/dL Final   AST 95/76/7974 48 (H)  15 - 41 U/L Final   ALT 07/11/2023 55 (H)  0 - 44 U/L Final   Alkaline Phosphatase 07/11/2023 59  38 - 126 U/L Final   Total Bilirubin 07/11/2023 0.6  0.0 - 1.2 mg/dL Final   GFR, Estimated 07/11/2023 >60  >  60 mL/min Final   Comment: (NOTE) Calculated using the CKD-EPI Creatinine Equation (2021)    Anion gap 07/11/2023 12  5 - 15 Final   Performed at [facility], [address] [place], [place] [address]   Alcohol, Ethyl (B) 07/11/2023 <15  <15 mg/dL Final   Comment: Please note change in reference range. (NOTE) For medical purposes only. Performed at [facility], [address], [place], [place] [address]    WBC 07/11/2023 9.3  4.0 - 10.5 K/uL Final   RBC 07/11/2023 3.97 (L)  4.22 - 5.81 MIL/uL Final   Hemoglobin 07/11/2023 13.1  13.0 - 17.0 g/dL Final   HCT 95/76/7974 39.3  39.0 - 52.0 % Final   MCV 07/11/2023 99.0  80.0 - 100.0 fL Final   MCH 07/11/2023 33.0  26.0 - 34.0 pg Final   MCHC 07/11/2023 33.3  30.0 - 36.0 g/dL Final   RDW  95/76/7974 13.2  11.5 - 15.5 % Final   Platelets 07/11/2023 558 (H)  150 - 400 K/uL Final   nRBC 07/11/2023 0.0  0.0 - 0.2 % Final   Neutrophils Relative % 07/11/2023 52  % Final   Neutro Abs 07/11/2023 4.8  1.7 - 7.7 K/uL Final   Lymphocytes Relative 07/11/2023 29  % Final   Lymphs Abs 07/11/2023 2.7  0.7 - 4.0 K/uL Final   Monocytes Relative 07/11/2023 13  % Final   Monocytes Absolute 07/11/2023 1.2 (H)  0.1 - 1.0 K/uL Final   Eosinophils Relative 07/11/2023 5  % Final   Eosinophils Absolute 07/11/2023 0.5  0.0 - 0.5 K/uL Final   Basophils Relative 07/11/2023 1  % Final   Basophils Absolute 07/11/2023 0.1  0.0 - 0.1 K/uL Final   Immature Granulocytes 07/11/2023 0  % Final   Abs Immature Granulocytes 07/11/2023 0.02  0.00 - 0.07 K/uL Final   Performed at [facility], [address], [place], [place] [address]   Salicylate Lvl 07/11/2023 <7.0 (L)  7.0 - 30.0 mg/dL Final   Performed at [facility], [address], [place], [place] [address]   Acetaminophen  (Tylenol ), Serum 07/11/2023 <10 (L)  10 - 30 ug/mL Final   Comment: (NOTE) Therapeutic concentrations vary significantly. A range of 10-30 ug/mL  may be an effective concentration for many patients. However, some  are best treated at concentrations outside of this range. Acetaminophen  concentrations >150 ug/mL at 4 hours after ingestion  and >50 ug/mL at 12 hours after ingestion are often associated with  toxic reactions.  Performed at [facility], [address], [place], [place] [address]    Total CK 07/11/2023 223  49 - 397 U/L Final   Performed at [facility], [address] [place], [place] [address]   Ammonia 07/11/2023 56 (H)  9 - 35 umol/L Final   Performed at [facility], [address] [place], [place] [address]  No results displayed because visit has over 200 results.      Blood Alcohol level:  Lab Results  Component Value Date   [facility] <15 09/04/2023   ETH <15 08/04/2023     Metabolic Disorder Labs: Lab Results  Component Value Date   HGBA1C 4.8 07/19/2023   MPG 91.06 07/19/2023   MPG 105.41 07/09/2022   No results found for: PROLACTIN Lab Results  Component Value Date   CHOL 168 07/19/2023   TRIG 120 07/19/2023   HDL 42 07/19/2023  CHOLHDL 4.0 07/19/2023   VLDL 24 07/19/2023   LDLCALC 102 (H) 07/19/2023   LDLCALC 78 07/09/2022    Therapeutic Lab Levels: No results found for: LITHIUM No results found for: VALPROATE No results found for: CBMZ  Physical Findings   AIMS    Flowsheet Row Clinical Support from 05/19/2022 in [facility]) [facility]) [facility]  AIMS Total Score 10 0 0   AUDIT    Flowsheet Row ED from 09/07/2023 in [facility] Admission (Discharged) [facility]) [facility]  Alcohol Use Disorder Identification Test Final Score (AUDIT) 24 31 0   GAD-7    Flowsheet Row Video Visit from 01/26/2022 in [facility] Video Visit from 11/01/2021 in [facility] Video Visit from 03/22/2021 in [facility] Office Visit from 12/21/2020 in [facility]  Total GAD-7 Score 17 18 20 19    PHQ2-9    Flowsheet Row ED from 09/06/2023 in [facility] ED from 09/05/2023 in [facility] Office Visit from 11/29/2022 in [facility] ED from 07/09/2022 in [facility] Video Visit from 01/26/2022 in [facility]  PHQ-2 Total Score 2 2 3  0 2  PHQ-9  Total Score 7 7 12 22 8    Flowsheet Row ED from 09/07/2023 in [facility] ED from 09/06/2023 in [facility] ED from 09/05/2023 in [facility]  C-SSRS RISK CATEGORY Moderate Risk Moderate Risk Error: Q3, 4, or 5 should not be populated when Q2 is No     Musculoskeletal  Strength & Muscle Tone: within normal limits Gait & Station: normal Patient leans: N/A  Psychiatric Specialty Exam  Presentation  General Appearance:  Disheveled  Eye Contact: Fair  Speech: Clear and Coherent  Speech Volume: Normal  Handedness: Right   Mood and Affect  Mood: Dysphoric  Affect: Appropriate; Congruent   Thought Process  Thought Processes: Coherent; Linear  Descriptions of Associations:Intact  Orientation:Full (Time, Place and Person)  Thought Content:WDL  Diagnosis of Schizophrenia or Schizoaffective disorder in past: Yes  Duration of Psychotic Symptoms: Greater than six months   Hallucinations:Hallucinations: None  Ideas of Reference:None  Suicidal Thoughts:Suicidal Thoughts: No  Homicidal Thoughts:Homicidal Thoughts: No   Sensorium  Memory: Immediate Good; Recent Fair  Judgment: Fair  Insight: Fair   Art therapist  Concentration: Good  Attention Span: Fair  Recall: Fair  Fund of Knowledge: Fair  Language: Good   Psychomotor Activity  Psychomotor Activity: Psychomotor Activity: Tremor   Assets  Assets: Desire for Improvement; Communication Skills; Resilience   Sleep  Sleep: Sleep: Good  Estimated Sleeping Duration (Last 24 Hours): 14.50-16.50 hours  No data recorded  Physical Exam  Physical Exam Vitals and nursing note reviewed.  Constitutional:      Appearance: Normal appearance.  HENT:     Head: Normocephalic.     Nose: Nose normal.   Eyes:     Extraocular Movements: Extraocular movements intact.    Cardiovascular:     Rate and  Rhythm: Normal rate.  Pulmonary:     Effort: Pulmonary effort is  normal.   Musculoskeletal:        General: Normal range of motion.     Cervical back: Normal range of motion.   Neurological:     General: No focal deficit present.     Mental Status: He is alert and oriented to person, place, and time.    Review of Systems  Constitutional: Negative.   HENT: Negative.    Eyes: Negative.   Respiratory: Negative.    Cardiovascular: Negative.   Gastrointestinal:  Positive for nausea.  Genitourinary: Negative.   Musculoskeletal: Negative.   Neurological: Negative.   Endo/Heme/Allergies: Negative.   Psychiatric/Behavioral:  Positive for substance abuse. The patient is nervous/anxious.    Blood pressure (!) 117/93, pulse 85, temperature 98.2 F (36.8 C), temperature source Oral, resp. rate 16, SpO2 98%. There is no height or weight on file to calculate BMI.  Treatment Plan Summary: Daily contact with patient to assess and evaluate symptoms and progress in treatment  -Continue with Ativan  taper for alcohol withdrawal.  Current CIWA by nursing: 4. - Continue with CIWA's and as needed Ativan  for CIWA's greater than 10. - Continue folic acid  and multivitamins for supplementation. - Continue Haldol  5 mg twice daily for schizophrenia. - Labs ordered: Vitamin B12, vitamin B6, folate and CMP redraw.  Patient was hypokalemic with a potassium level of 3.2.  He did receive 40 mEq of potassium chloride  at the [facility], ED on 09/05/2023.  Disposition: Pt would like to discuss SAIOP or regular outpatient substance abuse treatment options with social work Advertising account executive. I did discuss with pt that transitioning to a residential treatment facility would increase the likelihood of maintaining his sobriety especially with is hx of severe withdrawal symptoms.   Jerry JAYSON Mcardle, NP 09/09/2023 12:03 PM

## 2023-09-09 NOTE — ED Notes (Signed)
Patient asleep in bed without issue.  Will monitor. 

## 2023-09-10 DIAGNOSIS — F209 Schizophrenia, unspecified: Secondary | ICD-10-CM | POA: Diagnosis not present

## 2023-09-10 DIAGNOSIS — R251 Tremor, unspecified: Secondary | ICD-10-CM | POA: Diagnosis not present

## 2023-09-10 DIAGNOSIS — Z5900 Homelessness unspecified: Secondary | ICD-10-CM | POA: Diagnosis not present

## 2023-09-10 DIAGNOSIS — F1010 Alcohol abuse, uncomplicated: Secondary | ICD-10-CM | POA: Diagnosis not present

## 2023-09-10 MED ORDER — HYDROCERIN EX CREA
TOPICAL_CREAM | Freq: Two times a day (BID) | CUTANEOUS | Status: DC | PRN
  Filled 2023-09-10: qty 113

## 2023-09-10 NOTE — ED Notes (Signed)
 Pt presents with irritable affect.. Pt asking to speak with treatment team several times.  Informed patient team is aware of his requests and will be here to speak with him.  Pt attended AA briefly then left suddenly stated he did not want to here what AA member had to stay.  Encouraged patient to attend.  Pt declined.  Pt is more focused on discharged Minimal insight in need for treatment. Denied current SI plan and intent,  Denied HI and A/V hallucinations

## 2023-09-10 NOTE — ED Notes (Signed)
 Patient is sleeping. Respirations equal and unlabored, skin warm and dry, NAD. No change in assessment or acuity. Routine safety checks conducted according to facility protocol.

## 2023-09-10 NOTE — Group Note (Signed)
 Group Topic: Social Support  Group Date: 09/10/2023 Start Time: 1700 End Time: 1715 Facilitators: Celinda Suzen NOVAK, NT  Department: [facility]  Number of Participants: 6  Group Focus: relapse prevention Treatment Modality:  Psychoeducation Interventions utilized were support Purpose: relapse prevention strategies  Name: Jerry [name] Date of Birth: May 06, 1975  MR: [id number]    Level of Participation: withdrawn Quality of Participation: quiet Interactions with others: gave feedback Mood/Affect: appropriate Triggers (if applicable): n/a Cognition: not focused Progress: Other Response: MHT could not understand most of what PT was saying other than he was ready to be d/c Plan: patient will be encouraged to attend group  Patients Problems:  Patient Active Problem List   Diagnosis Date Noted   Alcohol use disorder 09/07/2023   Cocaine use disorder, moderate, dependence (HCC) 09/04/2023   Alcohol use disorder, moderate, dependence (HCC) 09/04/2023   Protein-calorie malnutrition, severe 07/03/2023   Left arm cellulitis 07/01/2023   Jaw pain 11/29/2022   Need for influenza vaccination 11/29/2022   Erectile dysfunction 11/29/2022   Hyponatremia 11/29/2022   History of hepatitis C virus infection 11/29/2022   Tardive dyskinesia 10/24/2022   Generalized anxiety disorder 10/24/2022   PTSD (post-traumatic stress disorder) 10/24/2022   Healthcare maintenance 10/24/2022   Alcohol withdrawal (HCC) 07/22/2022   Schizoaffective disorder, bipolar type (HCC) 07/09/2022   Schizophrenia (HCC) 07/07/2022   Auditory hallucination    Malingering 09/07/2020   Homelessness 09/07/2020   Major depressive disorder, recurrent severe without psychotic features (HCC) 07/17/2020   Schizophrenia spectrum disorder with psychotic disorder type not yet determined (HCC) 07/17/2020   Methamphetamine abuse (HCC) 07/17/2020   Marijuana abuse 07/17/2020   Cocaine abuse (HCC)  07/17/2020   Anxiety and depression 06/25/2014   Family history of diabetes mellitus (DM) 06/25/2014   Family history of thyroid  disease 06/25/2014   Tobacco use disorder 06/25/2014   Alcohol abuse 05/18/2013   Polysubstance abuse (HCC) 05/18/2013   Depression, unspecified 05/18/2013

## 2023-09-10 NOTE — ED Notes (Signed)
 Pt has keep low profile this part of shift.  Pt is aware of discharge tomorrow and is agreeable with POC.  Hourly observations continue for safety

## 2023-09-10 NOTE — ED Notes (Signed)
 Patient is in the bedroom calm and sleeping with eyes closed. NAD Respirations even and unlabored. Environment secured per policy. Will keep monitoring for safety.

## 2023-09-10 NOTE — Group Note (Signed)
 Group Topic: Communication  Group Date: 09/10/2023 Start Time: 1000 End Time: 1030 Facilitators: Carletha Iha, RN  Department: [facility]  Number of Participants: 8  Group Focus: nursing group Treatment Modality:  Psychoeducation Interventions utilized were patient education Purpose: Education on importance of continuing medications after discharge.   Name: Jerry [name] Date of Birth: July 16, 1975  MR: [id number]    Level of Participation: minimal Quality of Participation: isolative Interactions with others: minimal Mood/Affect: bored Triggers (if applicable):  Cognition: coherent/clear Progress: Gaining insight Response:  Plan: patient will be encouraged to  active Quality of Participation: quiet Interactions with others:  minimal Mood/Affect: appropriate Triggers (if applicable):  Cognition: insightful Progress: Gaining insight Response:  Plan: patient will be encouraged to Continue medications after discharge.  Patients Problems:  Patient Active Problem List   Diagnosis Date Noted   Alcohol use disorder 09/07/2023   Cocaine use disorder, moderate, dependence (HCC) 09/04/2023   Alcohol use disorder, moderate, dependence (HCC) 09/04/2023   Protein-calorie malnutrition, severe 07/03/2023   Left arm cellulitis 07/01/2023   Jaw pain 11/29/2022   Need for influenza vaccination 11/29/2022   Erectile dysfunction 11/29/2022   Hyponatremia 11/29/2022   History of hepatitis C virus infection 11/29/2022   Tardive dyskinesia 10/24/2022   Generalized anxiety disorder 10/24/2022   PTSD (post-traumatic stress disorder) 10/24/2022   Healthcare maintenance 10/24/2022   Alcohol withdrawal (HCC) 07/22/2022   Schizoaffective disorder, bipolar type (HCC) 07/09/2022   Schizophrenia (HCC) 07/07/2022   Auditory hallucination    Malingering 09/07/2020   Homelessness 09/07/2020   Major depressive disorder, recurrent severe without psychotic features  (HCC) 07/17/2020   Schizophrenia spectrum disorder with psychotic disorder type not yet determined (HCC) 07/17/2020   Methamphetamine abuse (HCC) 07/17/2020   Marijuana abuse 07/17/2020   Cocaine abuse (HCC) 07/17/2020   Anxiety and depression 06/25/2014   Family history of diabetes mellitus (DM) 06/25/2014   Family history of thyroid  disease 06/25/2014   Tobacco use disorder 06/25/2014   Alcohol abuse 05/18/2013   Polysubstance abuse (HCC) 05/18/2013   Depression, unspecified 05/18/2013

## 2023-09-10 NOTE — ED Notes (Signed)
 Patient walked to the nurses station with a concern of a an itchy rashes at the left hips area. According to patient, he just found out about it and he is thinking it might be resulting from bedbugs. Reassured patient and let him know, I will let the night providers aware.  Will continue to monitor.

## 2023-09-10 NOTE — ED Notes (Signed)
 Ativan  taper complete.  Pt is sometimes difficult to understand d/t pt mumbling

## 2023-09-10 NOTE — ED Provider Notes (Signed)
 Behavioral Health Progress Note  Date and Time: 09/10/2023 12:13 PM Name: Jerry [name] MRN:  [id number]  Subjective:  Jerry [name] 48 y.o., male with a PPHx of Schizophrenia, Alcohol Use Disorder and Polysubstance use disorder that presented [facility] voluntarily with complaints of needing to detox from ETOH, hallucinations and rehab services.   Chart review: soft BP. Compliant with medications. Used PRN zofran . CIWA 5 (n/v, tremor, sweats, anxiety). Last dose of ativan  today. Attended 1/3 groups. Wants SAIOP.   On evaluation Jerry [name] reports no issues with sleep or appetite today. He reports that he is not hearing voices. He reports that his mood is great and he is feeling better due to rest and medications. He denies withdrawal symptoms. He asked about discharge today because his wife is out there and they don't have any communication. He reports that they live in a tent off of [address]. 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 I talked with him about his substance use and he reports that while he will have moments of sobriety, he does not think that he will ever stop drinking alcohol because I like the taste of it, it's just something I like to do and will never give up. He reports he had a 1.5 year period of sobriety with going to [employer] Discussed the risks associated with alcohol use, he continues to be in a precontemplative stage of change. Discussed given elevated CIWA and today being the last day of his ativan  taper we will plan for discharge tomorrow. Discussed medications for alcohol use disorder however he declined. He expressed understanding of the plan for discharge tomorrow.     Diagnosis:  Final diagnoses:  Alcohol use disorder  Polysubstance abuse (HCC)  Hallucinations    Total Time spent with patient: 20 minutes  Past Psychiatric History: Dx: schizophrenia, AUD, stimulant use d/o cocaine type 6 IP psych hospitalizations in  2025 for paranoia and alcohol use History of w/d seizures, reporting most recently 3 years ago History of DT, reporting most recently one year ago Denies previous suicide attempts Past Medical History: none reported Family History:  Family History  Problem Relation Age of Onset   Heart disease Mother    Cancer Mother    Lung cancer Mother    Heart disease Father    Diabetes Father    Diabetes Maternal Grandmother    Stroke Paternal Grandfather     Family Psychiatric History: Polysubstance use Social History: Pt homeless and unemployed.   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, Armed forces operational officer, Personal relationships, Work / School Withdrawal Symptoms: Agitation, Tingling, Patient aware of relationship between substance abuse and physical/medical complications, Fever / Chills, Tremors, Sweats, Irritability, Weakness, Blackouts, Diarrhea, Nausea / Vomiting (Currently reporting withdrawal with trembling, nausea, naxiety) Name of Substance 1: alcohol 1 - Age of First Use: teen 1 - Amount (size/oz): unknown 1 - Frequency: was daily but pt stated he has not had any alcohol in over 24 hours 1 - Duration: ongoing 1 - Last Use / Amount: 09/05/23 1 - Method of Aquiring: purchase 1- Route of Use: oral Name of Substance 2: Hx of meth use 2 - Age of First Use: unknown 2 - Amount (size/oz): unknown 2 - Frequency: stated he stopped a few weeks ago 2 - Duration: stated he stopped a few weeks ago  2 - Last Use / Amount: unknown 2 - Method of Aquiring: unknown 2 - Route of Substance Use: unknown Name of Substance 3: Hx of cannabis use 3 - Age of First Use: teen 3 - Amount (size/oz): unknown 3 - Frequency: daily until a few weeks ago per pt 3 - Duration: stated he stopped a few weeks ago 3 - Last Use / Amount: unknown 3 - Method of Aquiring: unknown 3 - Route of Substance  Use: smoke                      Sleep: Good  Appetite:  Good  Current Medications:  Current Facility-Administered Medications  Medication Dose Route Frequency Provider Last Rate Last Admin   acetaminophen  (TYLENOL ) tablet 650 mg  650 mg Oral Q6H PRN Hoang, Daniela B, MD   650 mg at 09/08/23 2115   alum & mag hydroxide-simeth (MAALOX/MYLANTA) 200-200-20 MG/5ML suspension 30 mL  30 mL Oral Q4H PRN Hoang, Daniela B, MD       haloperidol  (HALDOL ) tablet 5 mg  5 mg Oral TID PRN Hoang, Daniela B, MD       And   diphenhydrAMINE  (BENADRYL ) capsule 50 mg  50 mg Oral TID PRN Hoang, Daniela B, MD       haloperidol  lactate (HALDOL ) injection 5 mg  5 mg Intramuscular TID PRN Hoang, Daniela B, MD       And   diphenhydrAMINE  (BENADRYL ) injection 50 mg  50 mg Intramuscular TID PRN Hoang, Daniela B, MD       And   LORazepam  (ATIVAN ) injection 2 mg  2 mg Intramuscular TID PRN Hoang, Daniela B, MD       haloperidol  lactate (HALDOL ) injection 10 mg  10 mg Intramuscular TID PRN Hoang, Daniela B, MD       And   diphenhydrAMINE  (BENADRYL ) injection 50 mg  50 mg Intramuscular TID PRN Hoang, Daniela B, MD       And   LORazepam  (ATIVAN ) injection 2 mg  2 mg Intramuscular TID PRN Hoang, Daniela B, MD       haloperidol  (HALDOL ) tablet 5 mg  5 mg Oral BID Hoang, Daniela B, MD   5 mg at 09/10/23 1014   hydrOXYzine  (ATARAX ) tablet 25 mg  25 mg Oral TID PRN Hoang, Daniela B, MD   25 mg at 09/08/23 2115   magnesium  hydroxide (MILK OF MAGNESIA) suspension 30 mL  30 mL Oral Daily PRN Hoang, Daniela B, MD       multivitamin with minerals tablet 1 tablet  1 tablet Oral Daily Hoang, Daniela B, MD   1 tablet at 09/10/23 1015   ondansetron  (ZOFRAN -ODT) disintegrating tablet 4 mg  4 mg Oral Q6H PRN Bobbitt, Shalon E, NP   4 mg at 09/09/23 2144   thiamine  (VITAMIN B1) tablet 100 mg  100 mg Oral Daily Hoang, Daniela B, MD   100 mg at 09/10/23 1014   traZODone  (DESYREL ) tablet 50 mg  50 mg Oral QHS PRN Hoang, Daniela B,  MD   50 mg at 09/08/23 2115   Current Outpatient Medications  Medication Sig Dispense Refill   haloperidol  (HALDOL ) 5 MG tablet Take 1 tablet (5 mg total) by mouth 2 (two) times daily. (Patient not taking: Reported on 08/06/2023) 60 tablet 0   hydrOXYzine  (ATARAX ) 25 MG tablet Take 1 tablet (25 mg total) by mouth 3 (three) times daily as needed for anxiety. (Patient not taking: Reported on 08/06/2023) 30 tablet 0  thiamine  (VITAMIN B-1) 100 MG tablet Take 1 tablet (100 mg total) by mouth daily. (Patient not taking: Reported on 08/06/2023) 30 tablet 0   traZODone  (DESYREL ) 50 MG tablet Take 1 tablet (50 mg total) by mouth at bedtime as needed for sleep. (Patient not taking: Reported on 08/06/2023) 30 tablet 0    Labs  Lab Results:  Admission on 09/07/2023  Component Date Value Ref Range Status   Sodium 09/09/2023 136  135 - 145 mmol/L Final   Potassium 09/09/2023 4.5  3.5 - 5.1 mmol/L Final   Chloride 09/09/2023 99  98 - 111 mmol/L Final   CO2 09/09/2023 28  22 - 32 mmol/L Final   Glucose, Bld 09/09/2023 84  70 - 99 mg/dL Final   Glucose reference range applies only to samples taken after fasting for at least 8 hours.   BUN 09/09/2023 11  6 - 20 mg/dL Final   Creatinine, Ser 09/09/2023 1.16  0.61 - 1.24 mg/dL Final   Calcium 93/77/7974 9.8  8.9 - 10.3 mg/dL Final   Total Protein 93/77/7974 7.9  6.5 - 8.1 g/dL Final   Albumin 93/77/7974 3.0 (L)  3.5 - 5.0 g/dL Final   AST 93/77/7974 60 (H)  15 - 41 U/L Final   ALT 09/09/2023 59 (H)  0 - 44 U/L Final   Alkaline Phosphatase 09/09/2023 56  38 - 126 U/L Final   Total Bilirubin 09/09/2023 0.6  0.0 - 1.2 mg/dL Final   GFR, Estimated 09/09/2023 >60  >60 mL/min Final   Comment: (NOTE) Calculated using the CKD-EPI Creatinine Equation (2021)    Anion gap 09/09/2023 9  5 - 15 Final   Performed at [facility], [address], [place], [place] [address]   Vitamin B-12 09/09/2023 206  180 - 914 pg/mL Final   Comment: (NOTE) This assay is not  validated for testing neonatal or myeloproliferative syndrome specimens for Vitamin B12 levels. Performed at [facility], [address], [place], [place] [address]   Admission on 09/06/2023, Discharged on 09/07/2023  Component Date Value Ref Range Status   POC Amphetamine UR 09/06/2023 None Detected  NONE DETECTED (Cut Off Level 1000 ng/mL) Final   POC Secobarbital (BAR) 09/06/2023 None Detected  NONE DETECTED (Cut Off Level 300 ng/mL) Final   POC Buprenorphine (BUP) 09/06/2023 None Detected  NONE DETECTED (Cut Off Level 10 ng/mL) Final   POC Oxazepam (BZO) 09/06/2023 Positive (A)  NONE DETECTED (Cut Off Level 300 ng/mL) Final   POC Cocaine UR 09/06/2023 Positive (A)  NONE DETECTED (Cut Off Level 300 ng/mL) Final   POC Methamphetamine UR 09/06/2023 None Detected  NONE DETECTED (Cut Off Level 1000 ng/mL) Final   POC Morphine 09/06/2023 None Detected  NONE DETECTED (Cut Off Level 300 ng/mL) Final   POC Methadone UR 09/06/2023 None Detected  NONE DETECTED (Cut Off Level 300 ng/mL) Final   POC Oxycodone  UR 09/06/2023 None Detected  NONE DETECTED (Cut Off Level 100 ng/mL) Final   POC Marijuana UR 09/06/2023 Positive (A)  NONE DETECTED (Cut Off Level 50 ng/mL) Final  Admission on 09/05/2023, Discharged on 09/05/2023  Component Date Value Ref Range Status   Sodium 09/05/2023 134 (L)  135 - 145 mmol/L Final   Potassium 09/05/2023 3.2 (L)  3.5 - 5.1 mmol/L Final   Chloride 09/05/2023 100  98 - 111 mmol/L Final   CO2 09/05/2023 24  22 - 32 mmol/L Final   Glucose, Bld 09/05/2023 117 (H)  70 - 99  mg/dL Final   Glucose reference range applies only to samples taken after fasting for at least 8 hours.   BUN 09/05/2023 7  6 - 20 mg/dL Final   Creatinine, Ser 09/05/2023 0.77  0.61 - 1.24 mg/dL Final   Calcium 93/81/7974 9.0  8.9 - 10.3 mg/dL Final   Total Protein 93/81/7974 8.2 (H)  6.5 - 8.1 g/dL Final   Albumin 93/81/7974 3.2 (L)  3.5 - 5.0 g/dL Final   AST 93/81/7974 79 (H)  15 - 41 U/L Final    ALT 09/05/2023 51 (H)  0 - 44 U/L Final   Alkaline Phosphatase 09/05/2023 68  38 - 126 U/L Final   Total Bilirubin 09/05/2023 1.1  0.0 - 1.2 mg/dL Final   GFR, Estimated 09/05/2023 >60  >60 mL/min Final   Comment: (NOTE) Calculated using the CKD-EPI Creatinine Equation (2021)    Anion gap 09/05/2023 10  5 - 15 Final   Performed at [facility], [address] [place], [place] [address]   WBC 09/05/2023 9.6  4.0 - 10.5 K/uL Final   RBC 09/05/2023 4.24  4.22 - 5.81 MIL/uL Final   Hemoglobin 09/05/2023 13.9  13.0 - 17.0 g/dL Final   HCT 93/81/7974 41.2  39.0 - 52.0 % Final   MCV 09/05/2023 97.2  80.0 - 100.0 fL Final   MCH 09/05/2023 32.8  26.0 - 34.0 pg Final   MCHC 09/05/2023 33.7  30.0 - 36.0 g/dL Final   RDW 93/81/7974 13.3  11.5 - 15.5 % Final   Platelets 09/05/2023 305  150 - 400 K/uL Final   nRBC 09/05/2023 0.0  0.0 - 0.2 % Final   Performed at [facility], [address], [place], [place] [address]  Admission on 09/04/2023, Discharged on 09/05/2023  Component Date Value Ref Range Status   Sodium 09/04/2023 128 (L)  135 - 145 mmol/L Final   Potassium 09/04/2023 3.0 (L)  3.5 - 5.1 mmol/L Final   Chloride 09/04/2023 97 (L)  98 - 111 mmol/L Final   CO2 09/04/2023 21 (L)  22 - 32 mmol/L Final   Glucose, Bld 09/04/2023 132 (H)  70 - 99 mg/dL Final   Glucose reference range applies only to samples taken after fasting for at least 8 hours.   BUN 09/04/2023 8  6 - 20 mg/dL Final   Creatinine, Ser 09/04/2023 0.86  0.61 - 1.24 mg/dL Final   Calcium 93/82/7974 8.1 (L)  8.9 - 10.3 mg/dL Final   Total Protein 93/82/7974 7.9  6.5 - 8.1 g/dL Final   Albumin 93/82/7974 3.2 (L)  3.5 - 5.0 g/dL Final   AST 93/82/7974 74 (H)  15 - 41 U/L Final   ALT 09/04/2023 48 (H)  0 - 44 U/L Final   Alkaline Phosphatase 09/04/2023 66  38 - 126 U/L Final   Total Bilirubin 09/04/2023 0.7  0.0 - 1.2 mg/dL Final   GFR, Estimated 09/04/2023 >60  >60 mL/min Final    Comment: (NOTE) Calculated using the CKD-EPI Creatinine Equation (2021)    Anion gap 09/04/2023 10  5 - 15 Final   Performed at [facility], [address], [place], [place] [address]   Alcohol, Ethyl (B) 09/04/2023 <15  <15 mg/dL Final   Comment: (NOTE) For medical purposes only. Performed at [facility], [address], [place], [place] [address]    Opiates 09/04/2023 NONE DETECTED  NONE DETECTED Final   Cocaine 09/04/2023 POSITIVE (A)  NONE DETECTED  Final   Benzodiazepines 09/04/2023 NONE DETECTED  NONE DETECTED Final   Amphetamines 09/04/2023 NONE DETECTED  NONE DETECTED Final   Tetrahydrocannabinol 09/04/2023 NONE DETECTED  NONE DETECTED Final   Barbiturates 09/04/2023 NONE DETECTED  NONE DETECTED Final   Comment: (NOTE) DRUG SCREEN FOR MEDICAL PURPOSES ONLY.  IF CONFIRMATION IS NEEDED FOR ANY PURPOSE, NOTIFY LAB WITHIN 5 DAYS.  LOWEST DETECTABLE LIMITS FOR URINE DRUG SCREEN Drug Class                     Cutoff (ng/mL) Amphetamine and metabolites    1000 Barbiturate and metabolites    200 Benzodiazepine                 200 Opiates and metabolites        300 Cocaine and metabolites        300 THC                            50 Performed at [facility], [address], [place], [place] [address]    WBC 09/04/2023 10.4  4.0 - 10.5 K/uL Final   RBC 09/04/2023 4.00 (L)  4.22 - 5.81 MIL/uL Final   Hemoglobin 09/04/2023 13.2  13.0 - 17.0 g/dL Final   HCT 93/82/7974 38.8 (L)  39.0 - 52.0 % Final   MCV 09/04/2023 97.0  80.0 - 100.0 fL Final   MCH 09/04/2023 33.0  26.0 - 34.0 pg Final   MCHC 09/04/2023 34.0  30.0 - 36.0 g/dL Final   RDW 93/82/7974 13.2  11.5 - 15.5 % Final   Platelets 09/04/2023 276  150 - 400 K/uL Final   nRBC 09/04/2023 0.0  0.0 - 0.2 % Final   Neutrophils Relative % 09/04/2023 83  % Final   Neutro Abs 09/04/2023 8.6 (H)  1.7 - 7.7 K/uL Final   Lymphocytes Relative 09/04/2023 7  % Final   Lymphs  Abs 09/04/2023 0.7  0.7 - 4.0 K/uL Final   Monocytes Relative 09/04/2023 10  % Final   Monocytes Absolute 09/04/2023 1.1 (H)  0.1 - 1.0 K/uL Final   Eosinophils Relative 09/04/2023 0  % Final   Eosinophils Absolute 09/04/2023 0.0  0.0 - 0.5 K/uL Final   Basophils Relative 09/04/2023 0  % Final   Basophils Absolute 09/04/2023 0.0  0.0 - 0.1 K/uL Final   Immature Granulocytes 09/04/2023 0  % Final   Abs Immature Granulocytes 09/04/2023 0.03  0.00 - 0.07 K/uL Final   Performed at [facility], [address] [place], [place] [address]   Potassium 09/05/2023 3.6  3.5 - 5.1 mmol/L Final   Performed at [facility], [address] [place], [place] [address]  Admission on 08/04/2023, Discharged on 08/07/2023  Component Date Value Ref Range Status   Sodium 08/04/2023 132 (L)  135 - 145 mmol/L Final   Potassium 08/04/2023 4.0  3.5 - 5.1 mmol/L Final   Chloride 08/04/2023 99  98 - 111 mmol/L Final   CO2 08/04/2023 25  22 - 32 mmol/L Final   Glucose, Bld 08/04/2023 100 (H)  70 - 99 mg/dL Final   Glucose reference range applies only to samples taken after fasting for at least 8 hours.   BUN 08/04/2023 10  6 - 20 mg/dL Final   Creatinine, Ser 08/04/2023 0.98  0.61 - 1.24 mg/dL Final   Calcium 94/82/7974 9.6  8.9 - 10.3 mg/dL Final  Total Protein 08/04/2023 9.1 (H)  6.5 - 8.1 g/dL Final   Albumin 94/82/7974 4.1  3.5 - 5.0 g/dL Final   AST 94/82/7974 32  15 - 41 U/L Final   ALT 08/04/2023 23  0 - 44 U/L Final   Alkaline Phosphatase 08/04/2023 59  38 - 126 U/L Final   Total Bilirubin 08/04/2023 1.1  0.0 - 1.2 mg/dL Final   GFR, Estimated 08/04/2023 >60  >60 mL/min Final   Comment: (NOTE) Calculated using the CKD-EPI Creatinine Equation (2021)    Anion gap 08/04/2023 8  5 - 15 Final   Performed at [facility], [address] [place], [place] [address]   Alcohol, Ethyl (B) 08/04/2023 <15  <15 mg/dL Final   Comment: Please note change in  reference range. (NOTE) For medical purposes only. Performed at [facility], [address], [place], [place] [address]    Opiates 08/04/2023 NONE DETECTED  NONE DETECTED Final   Cocaine 08/04/2023 POSITIVE (A)  NONE DETECTED Final   Benzodiazepines 08/04/2023 POSITIVE (A)  NONE DETECTED Final   Amphetamines 08/04/2023 POSITIVE (A)  NONE DETECTED Final   Comment: (NOTE) Trazodone  is metabolized in vivo to several metabolites, including pharmacologically active m-CPP, which is excreted in the urine. Immunoassay screens for amphetamines and MDMA have potential cross-reactivity with these compounds and may provide false positive  results.     Tetrahydrocannabinol 08/04/2023 NONE DETECTED  NONE DETECTED Final   Barbiturates 08/04/2023 NONE DETECTED  NONE DETECTED Final   Comment: (NOTE) DRUG SCREEN FOR MEDICAL PURPOSES ONLY.  IF CONFIRMATION IS NEEDED FOR ANY PURPOSE, NOTIFY LAB WITHIN 5 DAYS.  LOWEST DETECTABLE LIMITS FOR URINE DRUG SCREEN Drug Class                     Cutoff (ng/mL) Amphetamine and metabolites    1000 Barbiturate and metabolites    200 Benzodiazepine                 200 Opiates and metabolites        300 Cocaine and metabolites        300 THC                            50 Performed at [facility], [address], [place], [place] [address]    WBC 08/04/2023 7.4  4.0 - 10.5 K/uL Final   RBC 08/04/2023 4.07 (L)  4.22 - 5.81 MIL/uL Final   Hemoglobin 08/04/2023 13.5  13.0 - 17.0 g/dL Final   HCT 94/82/7974 40.7  39.0 - 52.0 % Final   MCV 08/04/2023 100.0  80.0 - 100.0 fL Final   MCH 08/04/2023 33.2  26.0 - 34.0 pg Final   MCHC 08/04/2023 33.2  30.0 - 36.0 g/dL Final   RDW 94/82/7974 13.3  11.5 - 15.5 % Final   Platelets 08/04/2023 329  150 - 400 K/uL Final   nRBC 08/04/2023 0.0  0.0 - 0.2 % Final   Neutrophils Relative % 08/04/2023 69  % Final   Neutro Abs 08/04/2023 5.1  1.7 - 7.7 K/uL Final   Lymphocytes Relative  08/04/2023 19  % Final   Lymphs Abs 08/04/2023 1.4  0.7 - 4.0 K/uL Final   Monocytes Relative 08/04/2023 10  % Final   Monocytes Absolute 08/04/2023 0.8  0.1 - 1.0 K/uL Final   Eosinophils Relative 08/04/2023 1  % Final   Eosinophils Absolute  08/04/2023 0.1  0.0 - 0.5 K/uL Final   Basophils Relative 08/04/2023 0  % Final   Basophils Absolute 08/04/2023 0.0  0.0 - 0.1 K/uL Final   Immature Granulocytes 08/04/2023 1  % Final   Abs Immature Granulocytes 08/04/2023 0.04  0.00 - 0.07 K/uL Final   Performed at [facility], [address] [place], [place] [address]  Admission on 07/14/2023, Discharged on 07/19/2023  Component Date Value Ref Range Status   Sodium 07/19/2023 131 (L)  135 - 145 mmol/L Final   Potassium 07/19/2023 4.3  3.5 - 5.1 mmol/L Final   Chloride 07/19/2023 98  98 - 111 mmol/L Final   CO2 07/19/2023 23  22 - 32 mmol/L Final   Glucose, Bld 07/19/2023 104 (H)  70 - 99 mg/dL Final   Glucose reference range applies only to samples taken after fasting for at least 8 hours.   BUN 07/19/2023 16  6 - 20 mg/dL Final   Creatinine, Ser 07/19/2023 0.76  0.61 - 1.24 mg/dL Final   Calcium 94/98/7974 9.2  8.9 - 10.3 mg/dL Final   Total Protein 94/98/7974 8.5 (H)  6.5 - 8.1 g/dL Final   Albumin 94/98/7974 3.5  3.5 - 5.0 g/dL Final   AST 94/98/7974 30  15 - 41 U/L Final   ALT 07/19/2023 34  0 - 44 U/L Final   Alkaline Phosphatase 07/19/2023 51  38 - 126 U/L Final   Total Bilirubin 07/19/2023 0.6  0.0 - 1.2 mg/dL Final   GFR, Estimated 07/19/2023 >60  >60 mL/min Final   Comment: (NOTE) Calculated using the CKD-EPI Creatinine Equation (2021)    Anion gap 07/19/2023 10  5 - 15 Final   Performed at [facility], [address] [place], [place] [address]   Hgb A1c MFr Bld 07/19/2023 4.8  4.8 - 5.6 % Final   Comment: (NOTE) Pre diabetes:          5.7%-6.4%  Diabetes:              >6.4%  Glycemic control for   <7.0% adults with diabetes    Mean  Plasma Glucose 07/19/2023 91.06  mg/dL Final   Performed at [facility], [address], [place], [place] [address]   Cholesterol 07/19/2023 168  0 - 200 mg/dL Final   Triglycerides 94/98/7974 120  <150 mg/dL Final   HDL 94/98/7974 42  >40 mg/dL Final   Total CHOL/HDL Ratio 07/19/2023 4.0  RATIO Final   VLDL 07/19/2023 24  0 - 40 mg/dL Final   LDL Cholesterol 07/19/2023 102 (H)  0 - 99 mg/dL Final   Comment:        Total Cholesterol/HDL:CHD Risk Coronary Heart Disease Risk Table                     Men   Women  1/2 Average Risk   3.4   3.3  Average Risk       5.0   4.4  2 X Average Risk   9.6   7.1  3 X Average Risk  23.4   11.0        Use the calculated Patient Ratio above and the CHD Risk Table to determine the patient's CHD Risk.        ATP III CLASSIFICATION (LDL):  <100     mg/dL   Optimal  899-870  mg/dL   Near or Above  Optimal  130-159  mg/dL   Borderline  839-810  mg/dL   High  >809     mg/dL   Very High Performed at [facility], [address] [place], [place] [address]    RPR Ser Ql 07/19/2023 NON REACTIVE  NON REACTIVE Final   Performed at [facility], [address], [place], [place] [address]   TSH 07/19/2023 1.931  0.350 - 4.500 uIU/mL Final   Comment: Performed by a 3rd Generation assay with a functional sensitivity of <=0.01 uIU/mL. Performed at [facility], [address] [place], [place] [address]    Vit D, 25-Hydroxy 07/19/2023 32.02  30 - 100 ng/mL Final   Comment: (NOTE) Vitamin D  deficiency has been defined by the Institute of Medicine  and an Endocrine Society practice guideline as a level of serum 25-OH  vitamin D  less than 20 ng/mL (1,2). The Endocrine Society went on to  further define vitamin D  insufficiency as a level between 21 and 29  ng/mL (2).  1. IOM (Institute of Medicine). 2010. Dietary reference intakes for  calcium and D. [place]: [employer]. 2. Holick MF, Binkley Ashley, Bischoff-Ferrari HA, et al. Evaluation,  treatment, and prevention of vitamin D  deficiency: an Endocrine  Society clinical practice guideline, JCEM. 2011 Jul; 96(7): 1911-30.  Performed at [facility], [address], [place], [place] [address]    Vitamin B-12 07/19/2023 269  180 - 914 pg/mL Final   Comment: (NOTE) This assay is not validated for testing neonatal or myeloproliferative syndrome specimens for Vitamin B12 levels. Performed at [facility], [address], [place], [place] [address]    HIV Screen 4th Generation wRfx 07/19/2023 Non Reactive  Non Reactive Final   Performed at [facility], [address], [place], [place] [address]  Admission on 07/13/2023, Discharged on 07/14/2023  Component Date Value Ref Range Status   WBC 07/13/2023 8.4  4.0 - 10.5 K/uL Final   RBC 07/13/2023 3.81 (L)  4.22 - 5.81 MIL/uL Final   Hemoglobin 07/13/2023 12.6 (L)  13.0 - 17.0 g/dL Final   HCT 95/74/7974 37.8 (L)  39.0 - 52.0 % Final   MCV 07/13/2023 99.2  80.0 - 100.0 fL Final   MCH 07/13/2023 33.1  26.0 - 34.0 pg Final   MCHC 07/13/2023 33.3  30.0 - 36.0 g/dL Final   RDW 95/74/7974 13.2  11.5 - 15.5 % Final   Platelets 07/13/2023 418 (H)  150 - 400 K/uL Final   REPEATED TO VERIFY   nRBC 07/13/2023 0.0  0.0 - 0.2 % Final   Neutrophils Relative % 07/13/2023 55  % Final   Neutro Abs 07/13/2023 4.7  1.7 - 7.7 K/uL Final   Lymphocytes Relative 07/13/2023 25  % Final   Lymphs Abs 07/13/2023 2.1  0.7 - 4.0 K/uL Final   Monocytes Relative 07/13/2023 12  % Final   Monocytes Absolute 07/13/2023 1.0  0.1 - 1.0 K/uL Final   Eosinophils Relative 07/13/2023 7  % Final   Eosinophils Absolute 07/13/2023 0.6 (H)  0.0 - 0.5 K/uL Final   Basophils Relative 07/13/2023 1  % Final   Basophils Absolute 07/13/2023 0.1  0.0 - 0.1 K/uL Final   Immature Granulocytes 07/13/2023 0  % Final   Abs Immature Granulocytes 07/13/2023 0.02  0.00 - 0.07  K/uL Final   Performed at [facility], [address], [place], [place] [address]  Sodium 07/13/2023 131 (L)  135 - 145 mmol/L Final   Potassium 07/13/2023 3.9  3.5 - 5.1 mmol/L Final   Chloride 07/13/2023 99  98 - 111 mmol/L Final   CO2 07/13/2023 23  22 - 32 mmol/L Final   Glucose, Bld 07/13/2023 105 (H)  70 - 99 mg/dL Final   Glucose reference range applies only to samples taken after fasting for at least 8 hours.   BUN 07/13/2023 12  6 - 20 mg/dL Final   Creatinine, Ser 07/13/2023 1.06  0.61 - 1.24 mg/dL Final   Calcium 95/74/7974 8.9  8.9 - 10.3 mg/dL Final   Total Protein 95/74/7974 8.1  6.5 - 8.1 g/dL Final   Albumin 95/74/7974 3.3 (L)  3.5 - 5.0 g/dL Final   AST 95/74/7974 41  15 - 41 U/L Final   ALT 07/13/2023 45 (H)  0 - 44 U/L Final   Alkaline Phosphatase 07/13/2023 64  38 - 126 U/L Final   Total Bilirubin 07/13/2023 0.8  0.0 - 1.2 mg/dL Final   GFR, Estimated 07/13/2023 >60  >60 mL/min Final   Comment: (NOTE) Calculated using the CKD-EPI Creatinine Equation (2021)    Anion gap 07/13/2023 9  5 - 15 Final   Performed at [facility], [address] [place], [place] [address]   Alcohol, Ethyl (B) 07/13/2023 <15  <15 mg/dL Final   Comment: Please note change in reference range. (NOTE) For medical purposes only. Performed at [facility], [address] [place], [place] [address]    Opiates 07/13/2023 NONE DETECTED  NONE DETECTED Final   Cocaine 07/13/2023 NONE DETECTED  NONE DETECTED Final   Benzodiazepines 07/13/2023 POSITIVE (A)  NONE DETECTED Final   Amphetamines 07/13/2023 POSITIVE (A)  NONE DETECTED Final   Comment: (NOTE) Trazodone  is metabolized in vivo to several metabolites, including pharmacologically active m-CPP, which is excreted in the urine. Immunoassay screens for amphetamines and MDMA have potential cross-reactivity with these compounds and may provide false positive  results.      Tetrahydrocannabinol 07/13/2023 POSITIVE (A)  NONE DETECTED Final   Barbiturates 07/13/2023 POSITIVE (A)  NONE DETECTED Final   Comment: (NOTE) DRUG SCREEN FOR MEDICAL PURPOSES ONLY.  IF CONFIRMATION IS NEEDED FOR ANY PURPOSE, NOTIFY LAB WITHIN 5 DAYS.  LOWEST DETECTABLE LIMITS FOR URINE DRUG SCREEN Drug Class                     Cutoff (ng/mL) Amphetamine and metabolites    1000 Barbiturate and metabolites    200 Benzodiazepine                 200 Opiates and metabolites        300 Cocaine and metabolites        300 THC                            50 Performed at [facility], [address], [place], [place] [address]   Admission on 07/11/2023, Discharged on 07/12/2023  Component Date Value Ref Range Status   Sodium 07/11/2023 134 (L)  135 - 145 mmol/L Final   Potassium 07/11/2023 3.7  3.5 - 5.1 mmol/L Final   Chloride 07/11/2023 100  98 - 111 mmol/L Final   CO2 07/11/2023 22  22 - 32 mmol/L Final   Glucose, Bld 07/11/2023 106 (H)  70 - 99 mg/dL Final   Glucose reference range applies only  to samples taken after fasting for at least 8 hours.   BUN 07/11/2023 10  6 - 20 mg/dL Final   Creatinine, Ser 07/11/2023 0.90  0.61 - 1.24 mg/dL Final   Calcium 95/76/7974 9.2  8.9 - 10.3 mg/dL Final   Total Protein 95/76/7974 8.9 (H)  6.5 - 8.1 g/dL Final   Albumin 95/76/7974 3.5  3.5 - 5.0 g/dL Final   AST 95/76/7974 48 (H)  15 - 41 U/L Final   ALT 07/11/2023 55 (H)  0 - 44 U/L Final   Alkaline Phosphatase 07/11/2023 59  38 - 126 U/L Final   Total Bilirubin 07/11/2023 0.6  0.0 - 1.2 mg/dL Final   GFR, Estimated 07/11/2023 >60  >60 mL/min Final   Comment: (NOTE) Calculated using the CKD-EPI Creatinine Equation (2021)    Anion gap 07/11/2023 12  5 - 15 Final   Performed at [facility], [address], [place], [place] [address]   Alcohol, Ethyl (B) 07/11/2023 <15  <15 mg/dL Final   Comment: Please note change in reference range. (NOTE) For medical purposes  only. Performed at [facility], [address] [place], [place] [address]    WBC 07/11/2023 9.3  4.0 - 10.5 K/uL Final   RBC 07/11/2023 3.97 (L)  4.22 - 5.81 MIL/uL Final   Hemoglobin 07/11/2023 13.1  13.0 - 17.0 g/dL Final   HCT 95/76/7974 39.3  39.0 - 52.0 % Final   MCV 07/11/2023 99.0  80.0 - 100.0 fL Final   MCH 07/11/2023 33.0  26.0 - 34.0 pg Final   MCHC 07/11/2023 33.3  30.0 - 36.0 g/dL Final   RDW 95/76/7974 13.2  11.5 - 15.5 % Final   Platelets 07/11/2023 558 (H)  150 - 400 K/uL Final   nRBC 07/11/2023 0.0  0.0 - 0.2 % Final   Neutrophils Relative % 07/11/2023 52  % Final   Neutro Abs 07/11/2023 4.8  1.7 - 7.7 K/uL Final   Lymphocytes Relative 07/11/2023 29  % Final   Lymphs Abs 07/11/2023 2.7  0.7 - 4.0 K/uL Final   Monocytes Relative 07/11/2023 13  % Final   Monocytes Absolute 07/11/2023 1.2 (H)  0.1 - 1.0 K/uL Final   Eosinophils Relative 07/11/2023 5  % Final   Eosinophils Absolute 07/11/2023 0.5  0.0 - 0.5 K/uL Final   Basophils Relative 07/11/2023 1  % Final   Basophils Absolute 07/11/2023 0.1  0.0 - 0.1 K/uL Final   Immature Granulocytes 07/11/2023 0  % Final   Abs Immature Granulocytes 07/11/2023 0.02  0.00 - 0.07 K/uL Final   Performed at [facility], [address] [place], [place] [address]   Salicylate Lvl 07/11/2023 <7.0 (L)  7.0 - 30.0 mg/dL Final   Performed at [facility], [address], [place], [place] [address]   Acetaminophen  (Tylenol ), Serum 07/11/2023 <10 (L)  10 - 30 ug/mL Final   Comment: (NOTE) Therapeutic concentrations vary significantly. A range of 10-30 ug/mL  may be an effective concentration for many patients. However, some  are best treated at concentrations outside of this range. Acetaminophen  concentrations >150 ug/mL at 4 hours after ingestion  and >50 ug/mL at 12 hours after ingestion are often associated with  toxic reactions.  Performed at [facility], [address], [place], [place] [address]     Total CK 07/11/2023 223  49 - 397 U/L Final   Performed at [facility], [address], [place], [place] [address]   Ammonia 07/11/2023 56 (H)  9 - 35 umol/L Final   Performed at [facility], [address], [place], [place] [address]  No results displayed because visit has over 200 results.      Blood Alcohol level:  Lab Results  Component Value Date   [facility] <15 09/04/2023   ETH <15 08/04/2023    Metabolic Disorder Labs: Lab Results  Component Value Date   HGBA1C 4.8 07/19/2023   MPG 91.06 07/19/2023   MPG 105.41 07/09/2022   No results found for: PROLACTIN Lab Results  Component Value Date   CHOL 168 07/19/2023   TRIG 120 07/19/2023   HDL 42 07/19/2023   CHOLHDL 4.0 07/19/2023   VLDL 24 07/19/2023   LDLCALC 102 (H) 07/19/2023   LDLCALC 78 07/09/2022    Therapeutic Lab Levels: No results found for: LITHIUM No results found for: VALPROATE No results found for: CBMZ  Physical Findings   AIMS    Flowsheet Row Clinical Support from 05/19/2022 in [facility]) [facility]) [facility]  AIMS Total Score 10 0 0   AUDIT    Flowsheet Row ED from 09/07/2023 in [facility] Admission (Discharged) [facility]) [facility]  Alcohol Use Disorder Identification Test Final Score (AUDIT) 24 31 0   GAD-7    Flowsheet Row Video Visit from 01/26/2022 in [facility] Video Visit from 11/01/2021 in [facility] Video Visit from 03/22/2021 in [facility] Office Visit from 12/21/2020 in [facility]  Total GAD-7 Score 17 18 20 19     PHQ2-9    Flowsheet Row ED from 09/07/2023 in [facility] ED from 09/06/2023 in [facility] ED from 09/05/2023 in [facility] Office Visit from 11/29/2022 in [facility] ED from 07/09/2022 in [facility]  PHQ-2 Total Score 0 2 2 3  0  PHQ-9 Total Score 0 7 7 12 22    Flowsheet Row ED from 09/07/2023 in [facility] ED from 09/06/2023 in [facility] ED from 09/05/2023 in [facility]  C-SSRS RISK CATEGORY Moderate Risk Moderate Risk Error: Q3, 4, or 5 should not be populated when Q2 is No     Musculoskeletal  Strength & Muscle Tone: within normal limits Gait & Station: normal Patient leans: N/A  Psychiatric Specialty Exam  Presentation  General Appearance:  Disheveled  Eye Contact: Fair  Speech: Clear and Coherent  Speech Volume: Normal  Handedness: Right   Mood and Affect  Mood: Feeling better  Affect: Appropriate; Congruent   Thought Process  Thought Processes: Coherent; Linear  Descriptions of Associations:Intact  Orientation:Full (Time, Place and Person)  Thought Content:WDL  Diagnosis of Schizophrenia or Schizoaffective disorder in past: Yes  Duration of Psychotic Symptoms: Greater than six months   Hallucinations:Hallucinations: None  Ideas of Reference:None  Suicidal Thoughts:Suicidal Thoughts: No  Homicidal Thoughts:Homicidal Thoughts: No   Sensorium  Memory: Immediate Good; Recent Fair  Judgment: Fair  Insight: Fair   Executive Functions  Concentration: Good  Attention Span: Fair  Recall: Dotti Abe of Knowledge: Fair  Language: Good   Psychomotor Activity  Psychomotor Activity: minimal tremor noted   Assets  Assets: Desire for Improvement; Communication Skills;  Resilience   Sleep  Sleep: Sleep: Good  Estimated Sleeping Duration (Last 24 Hours): 11.25-12.50 hours  Physical Exam  Physical Exam Vitals and nursing note reviewed.  Constitutional:      Appearance: Normal appearance.  HENT:     Head: Normocephalic.     Nose: Nose normal.   Eyes:     Extraocular Movements: Extraocular movements intact.    Cardiovascular:     Rate and Rhythm: Normal rate.  Pulmonary:     Effort: Pulmonary effort is normal.   Musculoskeletal:        General: Normal range of motion.     Cervical back: Normal range of motion.   Neurological:     General: No focal deficit present.     Mental Status: He is alert and oriented to person, place, and time.    Review of Systems  Constitutional: Negative.   HENT: Negative.    Eyes: Negative.   Respiratory: Negative.    Cardiovascular: Negative.   Gastrointestinal:  Positive for nausea.  Genitourinary: Negative.   Musculoskeletal: Negative.   Neurological: Negative.   Endo/Heme/Allergies: Negative.   Psychiatric/Behavioral:  Positive for substance abuse. The patient is nervous/anxious.    Blood pressure 104/73, pulse 85, temperature 98 F (36.7 C), temperature source Oral, resp. rate 17, SpO2 100%. There is no height or weight on file to calculate BMI.  Treatment Plan Summary: Daily contact with patient to assess and evaluate symptoms and progress in treatment  Jerry [name] 48 y.o., male with a PPHx of Schizophrenia, Alcohol Use Disorder and Polysubstance use disorder that presented [facility] voluntarily with complaints of needing to detox from ETOH, hallucinations and rehab services.   Patient had CIWA 5 and last dose of ativan  today. Patient requesting discharge however given CIWA 5 and hx of severe withdrawal will continue to monitor today and plan for discharge tomorrow with outpatient resources. Patient is in pre-contemplative stage of change regarding his substance use. Discussed starting  medications for his alcohol use disorder however he declined. Reporting decrease in hallucinations with restarting haldol .   Alcohol Use Disorder EtOH level: <15; LFTs: AST 79, ALT 51; last use date: 6/18 -Ativan  taper (6/20-23)             -Although BAL returned WNL, Pt has hx of w/d seizures and DT so pt has higher risk of severe w/d symptoms -CIWA with Ativan  as needed for CIWA greater than 10             -Last CIWA score 5 -Thiamine  100 mg IM first day and PO after that -Multivitamin with minerals daily -Tylenol  650 mg every 6 hours as needed for pain -Zofran  4 mg every 6 hours as needed for nausea or vomiting -Imodium  2 to 4 mg as needed for diarrhea or loose stools  -Maalox/Mylanta 30 mL every 4 hours as needed for indigestion -Milk of Mag 30 mL as needed for constipation    Schizophrenia -Continue home Haldol  5 mg BID   - Labs ordered: Vitamin B12 wnl, vitamin B6 pending. Repeat CMP with K wnl, AST 60, ALT 59.   Disposition: Pt would like to discuss SAIOP or regular outpatient substance abuse treatment options with social work.  Corean Minor, MD, PGY-2 09/10/2023 12:13 PM  This case was discussed with attending Dr. Leigh who  agrees with the above formulated treatment plan. Please see attending attestation for additional details.   This note was created using a voice recognition software as a result there may be grammatical errors inadvertently enclosed that do not reflect the nature of this encounter. Every attempt is made to correct such errors.

## 2023-09-10 NOTE — ED Notes (Signed)
 Patient is in the dayroom watching TV with other patients, slightly anxious but cooperative. NAD. Denies SI/HI/AVH. Respirations are even and unlabored. Will continue to monitor for safety.

## 2023-09-10 NOTE — ED Notes (Signed)
 Patient is sleeping. Respirations equal and unlabored. No change in assessment or acuity. Routine safety checks conducted according to facility protocol.

## 2023-09-10 NOTE — Group Note (Signed)
 Group Topic: Recovery Basics  Group Date: 09/10/2023 Start Time: 2000 End Time: 2030 Facilitators: Verdon Jacqualyn BRAVO, NT  Department: [facility]  Number of Participants: 6  Group Focus: self-awareness Treatment Modality:  Individual Therapy Interventions utilized were group exercise Purpose: relapse prevention strategies  Name: Jerry [name] Date of Birth: 15-Feb-1976  MR: [id number]    Level of Participation: did not participate Quality of Participation: n/a Interactions with others: n/a Mood/Affect: n/a Triggers (if applicable): n/a Cognition: n/a Progress: Other Response: n/a Plan: patient will be encouraged to attend group  Patients Problems:  Patient Active Problem List   Diagnosis Date Noted   Alcohol use disorder 09/07/2023   Cocaine use disorder, moderate, dependence (HCC) 09/04/2023   Alcohol use disorder, moderate, dependence (HCC) 09/04/2023   Protein-calorie malnutrition, severe 07/03/2023   Left arm cellulitis 07/01/2023   Jaw pain 11/29/2022   Need for influenza vaccination 11/29/2022   Erectile dysfunction 11/29/2022   Hyponatremia 11/29/2022   History of hepatitis C virus infection 11/29/2022   Tardive dyskinesia 10/24/2022   Generalized anxiety disorder 10/24/2022   PTSD (post-traumatic stress disorder) 10/24/2022   Healthcare maintenance 10/24/2022   Alcohol withdrawal (HCC) 07/22/2022   Schizoaffective disorder, bipolar type (HCC) 07/09/2022   Schizophrenia (HCC) 07/07/2022   Auditory hallucination    Malingering 09/07/2020   Homelessness 09/07/2020   Major depressive disorder, recurrent severe without psychotic features (HCC) 07/17/2020   Schizophrenia spectrum disorder with psychotic disorder type not yet determined (HCC) 07/17/2020   Methamphetamine abuse (HCC) 07/17/2020   Marijuana abuse 07/17/2020   Cocaine abuse (HCC) 07/17/2020   Anxiety and depression 06/25/2014   Family history of diabetes mellitus (DM)  06/25/2014   Family history of thyroid  disease 06/25/2014   Tobacco use disorder 06/25/2014   Alcohol abuse 05/18/2013   Polysubstance abuse (HCC) 05/18/2013   Depression, unspecified 05/18/2013

## 2023-09-11 DIAGNOSIS — F209 Schizophrenia, unspecified: Secondary | ICD-10-CM | POA: Diagnosis not present

## 2023-09-11 DIAGNOSIS — Z5900 Homelessness unspecified: Secondary | ICD-10-CM | POA: Diagnosis not present

## 2023-09-11 DIAGNOSIS — R251 Tremor, unspecified: Secondary | ICD-10-CM | POA: Diagnosis not present

## 2023-09-11 DIAGNOSIS — F1010 Alcohol abuse, uncomplicated: Secondary | ICD-10-CM | POA: Diagnosis not present

## 2023-09-11 MED ORDER — HALOPERIDOL 5 MG PO TABS: 5.0000 mg | ORAL_TABLET | Freq: Two times a day (BID) | ORAL | 0 refills | Status: DC

## 2023-09-11 MED ORDER — TRAZODONE HCL 50 MG PO TABS: 50.0000 mg | ORAL_TABLET | Freq: Every evening | ORAL | 0 refills | Status: DC | PRN

## 2023-09-11 MED ORDER — HYDROCERIN EX CREA: 1.0000 | TOPICAL_CREAM | Freq: Two times a day (BID) | CUTANEOUS | Status: DC | PRN

## 2023-09-11 MED ORDER — HYDROXYZINE HCL 25 MG PO TABS: 25.0000 mg | ORAL_TABLET | Freq: Three times a day (TID) | ORAL | 0 refills | Status: DC | PRN

## 2023-09-11 NOTE — ED Notes (Signed)
 Stable. A&O x 4.  Discharging to home/self care.    Pt provided with home medication and paper script.   Denies current SI plan and Intent.  Denies HI and A/V hallucinations.   All belongings returned to PT.  F/U instruction revied.  Pt verbalized understanding

## 2023-09-11 NOTE — ED Notes (Signed)
 Pt observed lying in bed. Eyes closed respirations even and non labored. NAD Hourly observations continue for safety.

## 2023-09-11 NOTE — ED Notes (Signed)
 Patient is in the bedroom calm and sleeping with eyes closed. NAD Respirations even and unlabored. Will monitor for safety.

## 2023-09-11 NOTE — Discharge Planning (Signed)
 LCSW met with patient to assess current mood, affect, physical state, and inquire about needs/goals while here in [facility] and after discharge. Patient reports he presented for alcohol detox with dx: schizophrenia, alcohol use disorder, polysubstance use disorder with hallucinations and withdrawal symptoms. Patient reports he has been drinking 2, filth's of vodka daily. He stated that he has been homeless with his significant other of 30's years and they live in a tent. Patient has been here before for detox. We discussed outpatient resources for substance abuse and he stated that he does not like AA/NA meetings and adamantly refuses for any follow up other than with Dr. Lynnette upstairs for medication management, thought he reports being non compliant with his medications due to the cost of co-pays.   Patient reported having a 3 year period of sobriety but can not recall when. He stated it was after the last stay here and follow up with outpatient upstairs. Patient does utilize the bus system and reports having good supports of his S/O. reports having limited social support. Patient reports his current goal is for detox and follow up with Dr. Lynnette. Patient denies any prior history of outpatient or inpatient substance abuse treatment. Patient currently denies any SI/HI/AVH and reports mood as calm and cooperative.   Patient expressed appreciation of LCSW assistance. No other needs were reported at this time by patient. Per MD, patient is stable for DC today and SW will assist with referral for follow up appointment. SW attempted to schedule a new patient appointment for a PCP and scheduler was not available on this date and patient does not have a phone.  He will be provided with a bus pass for transport home.

## 2023-09-11 NOTE — ED Notes (Signed)
 Patient asleep this time, no CIWA assessment could be done.

## 2023-09-11 NOTE — Discharge Instructions (Addendum)
 Non-Emergent / Urgent   [facility] [address] SECOND FLOOR [place], [place] [address] [phone] OUTPATIENT Walk-in information: Please note, all walk-ins are first come & first serve, with limited number of availability.   Please note that to be eligible for services you must bring: ID or a piece of mail with your name [facility] address   Therapist for therapy:  Monday & Wednesdays: Please ARRIVE at 7:15 AM for registration Will START at 8:00 AM Every 1st & 2nd Friday of the month: Please ARRIVE at 10:15 AM for registration Will START at 1 PM - 5 PM   Psychiatrist for medication management: Monday - Friday:  Please ARRIVE at 7:15 AM for registration Will START at 8:00 AM   Regretfully, due to limited availability, please be aware that you may not been seen on the same day as walk-in. Please consider making an appoint or try again. Thank you for your patience and understanding. ________________________________________________________   [facility]:  [address] FIRST FLOOR.  [place], [place] [address].  [phone]   Mobile Crisis Response Teams Listed by counties in vicinity of [facility] providers [facility] [place] [facility]  [facility] [phone] [facility]  [facility] [phone] [facility]  [facility] [phone] [facility]  [facility] [phone] [place]                 * [facility] [phone]                * [facility] [phone] [facility]  [facility] [phone] [facility]  [phone] * [facility] [phone] ________________________________________________________  LCSW spoke with patient regarding plans at discharge. Patient aware of resources provided at the [facility]-Outpatient New Patient Assessment/Therapy Walk ins:. Instructions provided in AVS. Patient expressed appreciation for LCSW assistance with discharge planning. Patient reports feeling safe to discharge and reports having support from family when he/she returns home. No other needs were reported at this time. LCSW to sign off. Please inform if further LCSW needs arise prior to discharge.   You have a follow up appointment scheduled on the second floor with psychiatrist on August 7th @ 2:00.

## 2023-09-11 NOTE — ED Notes (Signed)
 Pt was provided breakfast.

## 2023-09-11 NOTE — ED Provider Notes (Signed)
 FBC/OBS ASAP Discharge Summary  Date and Time: 09/11/2023 11:21 AM  Name: Jerry [name]  MRN:  [id number]   Discharge Diagnoses:  Final diagnoses:  Alcohol use disorder  Polysubstance abuse (HCC)  Hallucinations   Subjective:   Jerry [name] 48 y.o., male with a PPHx of Schizophrenia, Alcohol Use Disorder and Polysubstance use disorder that presented [facility] voluntarily with complaints of needing to detox from ETOH, hallucinations and rehab services.   On assessment, patient was interviewed in his room.  Patient denied any depressive or anxiety symptoms.  He denied any suicidal ideations, homicidal ideations or auditory or visual hallucinations.  Patient reports good tolerance of Haldol , which helps with his paranoid symptoms.  Encouraged patient to continue to cease from substances going forward to decrease likelihood of psychiatric destabilization.  Patient would like to follow up outpatient with provider for medication management, he was provided with sample medications and written prescriptions to get filled.  Stay Summary:   During the patient's hospitalization, patient had extensive initial psychiatric evaluation, and follow-up psychiatric evaluations every day.  During the hospitalization,  the patient's psychiatric medication regimen at discharge was:  - Haldol  5 mg twice daily for psychosis - Trazodone  50 mg as needed at bedtime for insomnia - Hydroxyzine  25 mg 3 times daily as needed for anxiety  Patient's care was discussed during the interdisciplinary team meeting every day during the hospitalization.  The patient denied having side effects to prescribed psychiatric medication.  Gradually, patient started adjusting to milieu. The patient was evaluated each day by a clinical provider to ascertain response to treatment. Improvement was noted by the patient's report of decreasing symptoms, improved sleep and appetite, affect, medication tolerance, behavior, and participation in  unit programming.  Patient was asked each day to complete a self inventory noting mood, mental status, pain, new symptoms, anxiety and concerns.    Symptoms were reported as significantly decreased or resolved completely by discharge.   On day of discharge, the patient reports that their mood is stable. The patient denied having suicidal thoughts for more than 48 hours prior to discharge.  Patient denies having homicidal thoughts.  Patient denies having auditory hallucinations.  Patient denies any visual hallucinations or other symptoms of psychosis. The patient was motivated to continue taking medication with a goal of continued improvement in mental health.   The patient reports their target psychiatric symptoms of substance withdrawal and hallucinations responded well to the psychiatric medications, and the patient reports overall benefit other psychiatric hospitalization. Supportive psychotherapy was provided to the patient. The patient also participated in regular group therapy while hospitalized. Coping skills, problem solving as well as relaxation therapies were also part of the unit programming.  Labs were reviewed with the patient, and abnormal results were discussed with the patient.  The patient is able to verbalize their individual safety plan to this provider.  # It is recommended to the patient to continue psychiatric medications as prescribed, after discharge from the hospital.    # It is recommended to the patient to follow up with your outpatient psychiatric provider and PCP.  # It was discussed with the patient, the impact of alcohol, drugs, tobacco have been there overall psychiatric and medical wellbeing, and total abstinence from substance use was recommended the patient.ed.  # Prescriptions provided or sent directly to preferred pharmacy at discharge. Patient agreeable to plan. Given opportunity to ask questions. Appears to feel comfortable with discharge.    # In the event  of  worsening symptoms, the patient is instructed to call the crisis hotline, 911 and or go to the nearest ED for appropriate evaluation and treatment of symptoms. To follow-up with primary care provider for other medical issues, concerns and or health care needs  # Patient was discharged to self with a plan to follow up as noted below.   Total Time spent with patient: 20 minutes  Past Psychiatric History:  Dx: schizophrenia, AUD, stimulant use d/o cocaine type Rx: per dc summary from [facility] admission on 06/2023 Haldol  5 mg BID, Atarax  PRN, thiamine  daily, trazodone  PRN 6 IP psych hospitalizations in 2025 for paranoia and alcohol use History of w/d seizures, reporting most recently 3 years ago History of DT, reporting most recently one year ago Denies previous suicide attempts   Past Medical History: None reported   Family Psychiatric  History:  Multiple Members- EtOH and Polysubstance Abuse No Known Diagnosis' or Suicides  Social History: Patient is unhoused and unemployed   Additional Social History:  Pain Medications: SEE MAR Prescriptions: SEE MAR Over the Counter: see MAR History of alcohol / drug use?: Yes Longest period of sobriety (when/how long): 3 yrs 2018-2020 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, Armed forces operational officer, Personal relationships, Work / Programmer, multimedia Withdrawal Symptoms: Agitation, Tingling, Patient aware of relationship between substance abuse and physical/medical complications, Fever / Chills, Tremors, Sweats, Irritability, Weakness, Blackouts, Diarrhea, Nausea / Vomiting (Currently reporting withdrawal with trembling, nausea, naxiety) Name of Substance 1: alcohol 1 - Age of First Use: teen 1 - Amount (size/oz): unknown 1 - Frequency: was daily but pt stated he has not had any alcohol in over 24 hours 1 - Duration: ongoing 1 - Last Use / Amount: 09/05/23 1 - Method of Aquiring: purchase 1- Route of Use: oral Name of Substance 2: Hx of meth use 2 - Age of First Use: unknown 2  - Amount (size/oz): unknown 2 - Frequency: stated he stopped a few weeks ago 2 - Duration: stated he stopped a few weeks ago 2 - Last Use / Amount: unknown 2 - Method of Aquiring: unknown 2 - Route of Substance Use: unknown Name of Substance 3: Hx of cannabis use 3 - Age of First Use: teen 3 - Amount (size/oz): unknown 3 - Frequency: daily until a few weeks ago per pt 3 - Duration: stated he stopped a few weeks ago 3 - Last Use / Amount: unknown 3 - Method of Aquiring: unknown 3 - Route of Substance Use: smoke   Tobacco Cessation:  A prescription for an FDA-approved tobacco cessation medication was offered at discharge and the patient refused  Current Medications:  Current Facility-Administered Medications  Medication Dose Route Frequency Provider Last Rate Last Admin   acetaminophen  (TYLENOL ) tablet 650 mg  650 mg Oral Q6H PRN Hoang, Daniela B, MD   650 mg at 09/08/23 2115   alum & mag hydroxide-simeth (MAALOX/MYLANTA) 200-200-20 MG/5ML suspension 30 mL  30 mL Oral Q4H PRN Hoang, Daniela B, MD       haloperidol  (HALDOL ) tablet 5 mg  5 mg Oral TID PRN Hoang, Daniela B, MD       And   diphenhydrAMINE  (BENADRYL ) capsule 50 mg  50 mg Oral TID PRN Hoang, Daniela B, MD       haloperidol  lactate (HALDOL ) injection 5 mg  5 mg Intramuscular TID PRN Hoang, Daniela B, MD       And   diphenhydrAMINE  (BENADRYL ) injection 50 mg  50 mg Intramuscular TID PRN Hoang, Daniela B, MD  And   LORazepam  (ATIVAN ) injection 2 mg  2 mg Intramuscular TID PRN Hoang, Daniela B, MD       haloperidol  lactate (HALDOL ) injection 10 mg  10 mg Intramuscular TID PRN Hoang, Daniela B, MD       And   diphenhydrAMINE  (BENADRYL ) injection 50 mg  50 mg Intramuscular TID PRN Hoang, Daniela B, MD       And   LORazepam  (ATIVAN ) injection 2 mg  2 mg Intramuscular TID PRN Hoang, Daniela B, MD       haloperidol  (HALDOL ) tablet 5 mg  5 mg Oral BID Hoang, Daniela B, MD   5 mg at 09/11/23 9070   hydrocerin (EUCERIN) cream    Topical BID PRN Onuoha, Chinwendu V, NP   Given at 09/10/23 2118   hydrOXYzine  (ATARAX ) tablet 25 mg  25 mg Oral TID PRN Hoang, Daniela B, MD   25 mg at 09/10/23 2117   magnesium  hydroxide (MILK OF MAGNESIA) suspension 30 mL  30 mL Oral Daily PRN Hoang, Daniela B, MD       multivitamin with minerals tablet 1 tablet  1 tablet Oral Daily Hoang, Daniela B, MD   1 tablet at 09/11/23 0930   ondansetron  (ZOFRAN -ODT) disintegrating tablet 4 mg  4 mg Oral Q6H PRN Bobbitt, Shalon E, NP   4 mg at 09/09/23 2144   thiamine  (VITAMIN B1) tablet 100 mg  100 mg Oral Daily Hoang, Daniela B, MD   100 mg at 09/11/23 9070   traZODone  (DESYREL ) tablet 50 mg  50 mg Oral QHS PRN Hoang, Daniela B, MD   50 mg at 09/10/23 2117   Current Outpatient Medications  Medication Sig Dispense Refill   haloperidol  (HALDOL ) 5 MG tablet Take 1 tablet (5 mg total) by mouth 2 (two) times daily. 60 tablet 0   hydrocerin (EUCERIN) CREA Apply 1 Application topically 2 (two) times daily as needed (Localized rash).     hydrOXYzine  (ATARAX ) 25 MG tablet Take 1 tablet (25 mg total) by mouth 3 (three) times daily as needed for anxiety. 30 tablet 0   thiamine  (VITAMIN B-1) 100 MG tablet Take 1 tablet (100 mg total) by mouth daily. (Patient not taking: Reported on 08/06/2023) 30 tablet 0   traZODone  (DESYREL ) 50 MG tablet Take 1 tablet (50 mg total) by mouth at bedtime as needed for sleep. 30 tablet 0    PTA Medications:  PTA Medications  Medication Sig   thiamine  (VITAMIN B-1) 100 MG tablet Take 1 tablet (100 mg total) by mouth daily. (Patient not taking: Reported on 08/06/2023)   haloperidol  (HALDOL ) 5 MG tablet Take 1 tablet (5 mg total) by mouth 2 (two) times daily.   traZODone  (DESYREL ) 50 MG tablet Take 1 tablet (50 mg total) by mouth at bedtime as needed for sleep.   hydrOXYzine  (ATARAX ) 25 MG tablet Take 1 tablet (25 mg total) by mouth 3 (three) times daily as needed for anxiety.   hydrocerin (EUCERIN) CREA Apply 1 Application  topically 2 (two) times daily as needed (Localized rash).   Facility Ordered Medications  Medication   haloperidol  (HALDOL ) tablet 5 mg   [EXPIRED] LORazepam  (ATIVAN ) tablet 1 mg   [COMPLETED] LORazepam  (ATIVAN ) tablet 1 mg   Followed by   [COMPLETED] LORazepam  (ATIVAN ) tablet 1 mg   Followed by   [COMPLETED] LORazepam  (ATIVAN ) tablet 1 mg   Followed by   [COMPLETED] LORazepam  (ATIVAN ) tablet 1 mg   thiamine  (VITAMIN B1) tablet 100 mg   multivitamin  with minerals tablet 1 tablet   [EXPIRED] ondansetron  (ZOFRAN -ODT) disintegrating tablet 4 mg   [EXPIRED] loperamide  (IMODIUM ) capsule 2-4 mg   acetaminophen  (TYLENOL ) tablet 650 mg   alum & mag hydroxide-simeth (MAALOX/MYLANTA) 200-200-20 MG/5ML suspension 30 mL   magnesium  hydroxide (MILK OF MAGNESIA) suspension 30 mL   haloperidol  (HALDOL ) tablet 5 mg   And   diphenhydrAMINE  (BENADRYL ) capsule 50 mg   haloperidol  lactate (HALDOL ) injection 5 mg   And   diphenhydrAMINE  (BENADRYL ) injection 50 mg   And   LORazepam  (ATIVAN ) injection 2 mg   haloperidol  lactate (HALDOL ) injection 10 mg   And   diphenhydrAMINE  (BENADRYL ) injection 50 mg   And   LORazepam  (ATIVAN ) injection 2 mg   hydrOXYzine  (ATARAX ) tablet 25 mg   traZODone  (DESYREL ) tablet 50 mg   ondansetron  (ZOFRAN -ODT) disintegrating tablet 4 mg   hydrocerin (EUCERIN) cream       09/11/2023    9:20 AM 09/10/2023   12:12 PM 09/06/2023    2:18 PM  Depression screen PHQ 2/9  Decreased Interest 0 0 1  Down, Depressed, Hopeless 0 0 1  PHQ - 2 Score 0 0 2  Altered sleeping 0 0 1  Tired, decreased energy 0 0 1  Change in appetite 0 0 0  Feeling bad or failure about yourself  0 0 1  Trouble concentrating 0 0 1  Moving slowly or fidgety/restless 0 0 1  Suicidal thoughts 0 0 0  PHQ-9 Score 0 0 7  Difficult doing work/chores   Very difficult    Flowsheet Row ED from 09/07/2023 in [facility] ED from 09/06/2023 in [facility] ED from [facility]05/2023 in [facility]  C-SSRS RISK CATEGORY Moderate Risk Moderate Risk Error: Q3, 4, or 5 should not be populated when Q2 is No    Musculoskeletal  Strength & Muscle Tone: within normal limits Gait & Station: normal Patient leans: N/A  Psychiatric Specialty Exam  Presentation  General Appearance:  Disheveled  Eye Contact: Good  Speech: Normal Rate  Speech Volume: Normal  Handedness: Right   Mood and Affect  Mood: Euthymic  Affect: Appropriate; Congruent   Thought Process  Thought Processes: Coherent; Linear  Descriptions of Associations:Intact  Orientation:Full (Time, Place and Person)  Thought Content:Logical  Diagnosis of Schizophrenia or Schizoaffective disorder in past: Yes  Duration of Psychotic Symptoms: Greater than six months   Hallucinations:Hallucinations: None  Ideas of Reference:None  Suicidal Thoughts:Suicidal Thoughts: No  Homicidal Thoughts:Homicidal Thoughts: No   Sensorium  Memory: Immediate Fair  Judgment: Fair  Insight: Fair   Executive Functions  Concentration: Good  Attention Span: Good  Recall: Fair  Fund of Knowledge: Fair  Language: Good   Psychomotor Activity  Psychomotor Activity:Psychomotor Activity: Normal   Assets  Assets: Desire for Improvement; Resilience   Sleep  Sleep:Sleep: Fair  Estimated Sleeping Duration (Last 24 Hours): 9.75-11.25 hours  No data recorded  Physical Exam  Physical Exam  Eyes:     Conjunctiva/sclera: Conjunctivae normal.   Pulmonary:     Effort: Pulmonary effort is normal.   Musculoskeletal:        General: Normal range of motion.   Neurological:     Mental Status: He is alert and oriented to person, place, and time.   Psychiatric:        Attention and Perception: He does not perceive auditory or visual hallucinations.        Mood and Affect: Mood  is not anxious, depressed or elated. Affect is not  labile or angry.        Speech: Speech is not rapid and pressured or tangential.        Behavior: Behavior is not agitated, slowed, aggressive or withdrawn.        Thought Content: Thought content is not paranoid or delusional. Thought content does not include homicidal or suicidal ideation. Thought content does not include homicidal or suicidal plan.    Review of Systems  Constitutional:  Negative for chills and fever.  Respiratory:  Negative for cough.   Cardiovascular:  Negative for chest pain.  Gastrointestinal:  Negative for diarrhea, nausea and vomiting.  Neurological:  Negative for tremors and headaches.  Psychiatric/Behavioral:  Negative for depression, hallucinations, memory loss, substance abuse and suicidal ideas. The patient is not nervous/anxious and does not have insomnia.    Blood pressure 109/66, pulse 70, temperature 98.3 F (36.8 C), temperature source Oral, resp. rate 17, SpO2 98%. There is no height or weight on file to calculate BMI.  Demographic Factors:  Male, Caucasian, Low socioeconomic status, and Unemployed  Loss Factors: Decrease in vocational status and Financial problems/change in socioeconomic status  Historical Factors: Family history of mental illness or substance abuse and chronic polysubstance use history   Risk Reduction Factors:   NA  Continued Clinical Symptoms:  Alcohol/Substance Abuse/Dependencies More than one psychiatric diagnosis Unstable or Poor Therapeutic Relationship Previous Psychiatric Diagnoses and Treatments  Cognitive Features That Contribute To Risk:  None    Suicide Risk:  Minimal: No identifiable suicidal ideation.  May be classified as minimal risk based on the severity of the depressive symptoms  Plan Of Care/Follow-up recommendations:  Activity: as tolerated  Diet: heart healthy  Other: -Follow-up with your outpatient psychiatric provider -instructions on appointment date, time, and address (location) are  provided to you in discharge paperwork.  -Take your psychiatric medications as prescribed at discharge - instructions are provided to you in the discharge paperwork  -Follow-up with outpatient primary care doctor and other specialists -for management of preventative medicine and chronic medical disease  -Testing: Follow-up with outpatient provider for abnormal lab results:  Vitamin B6 pending at time of d/c CMP w/ elevated LFTS  60/59   -If you are prescribed an atypical antipsychotic medication, we recommend that your outpatient psychiatrist follow routine screening for side effects within 3 months of discharge, including monitoring: AIMS scale, height, weight, blood pressure, fasting lipid panel, HbA1c, and fasting blood sugar.   -Recommend total abstinence from alcohol, tobacco, and other illicit drug use at discharge.   -If your psychiatric symptoms recur, worsen, or if you have side effects to your psychiatric medications, call your outpatient psychiatric provider, 911, [phone] or go to the nearest emergency department.  -If suicidal thoughts occur, immediately call your outpatient psychiatric provider, 911, [phone] or go to the nearest emergency department.   Disposition: Self Care  PATTI OLDEN, MD 09/11/2023, 11:21 AM

## 2023-09-12 LAB — VITAMIN B6: Vitamin B6: 7 ug/L (ref 3.4–65.2)

## 2023-10-25 ENCOUNTER — Ambulatory Visit (HOSPITAL_COMMUNITY): Payer: MEDICAID

## 2024-01-01 ENCOUNTER — Encounter (HOSPITAL_COMMUNITY): Payer: MEDICAID | Admitting: Psychiatry
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
## 2024-01-04 ENCOUNTER — Emergency Department (HOSPITAL_COMMUNITY)
Admission: EM | Admit: 2024-01-04 | Discharge: 2024-01-05 | Disposition: A | Discharge status: Home / Self Care | Payer: MEDICAID | Source: Home / Self Care | Attending: Emergency Medicine | Admitting: Emergency Medicine

## 2024-01-04 ENCOUNTER — Encounter (HOSPITAL_COMMUNITY): Payer: Self-pay

## 2024-01-04 ENCOUNTER — Encounter (HOSPITAL_COMMUNITY): Payer: Self-pay | Admitting: Emergency Medicine

## 2024-01-04 ENCOUNTER — Ambulatory Visit (HOSPITAL_COMMUNITY)
Admission: EM | Admit: 2024-01-04 | Discharge: 2024-01-04 | Disposition: A | Discharge status: Home / Self Care | Payer: MEDICAID

## 2024-01-04 ENCOUNTER — Other Ambulatory Visit: Payer: Self-pay

## 2024-01-04 ENCOUNTER — Other Ambulatory Visit (HOSPITAL_COMMUNITY): Payer: Self-pay

## 2024-01-04 ENCOUNTER — Emergency Department (HOSPITAL_COMMUNITY)
Admission: EM | Admit: 2024-01-04 | Discharge: 2024-01-04 | Disposition: A | Discharge status: Home / Self Care | Payer: MEDICAID | Attending: Emergency Medicine | Admitting: Emergency Medicine

## 2024-01-04 DIAGNOSIS — F1410 Cocaine abuse, uncomplicated: Secondary | ICD-10-CM | POA: Diagnosis not present

## 2024-01-04 DIAGNOSIS — F200 Paranoid schizophrenia: Secondary | ICD-10-CM | POA: Diagnosis not present

## 2024-01-04 DIAGNOSIS — F22 Delusional disorders: Secondary | ICD-10-CM | POA: Diagnosis not present

## 2024-01-04 DIAGNOSIS — Z760 Encounter for issue of repeat prescription: Secondary | ICD-10-CM | POA: Diagnosis present

## 2024-01-04 DIAGNOSIS — F1510 Other stimulant abuse, uncomplicated: Secondary | ICD-10-CM | POA: Diagnosis not present

## 2024-01-04 DIAGNOSIS — Z711 Person with feared health complaint in whom no diagnosis is made: Secondary | ICD-10-CM | POA: Insufficient documentation

## 2024-01-04 DIAGNOSIS — R45851 Suicidal ideations: Secondary | ICD-10-CM | POA: Diagnosis present

## 2024-01-04 DIAGNOSIS — F319 Bipolar disorder, unspecified: Secondary | ICD-10-CM | POA: Diagnosis not present

## 2024-01-04 DIAGNOSIS — R443 Hallucinations, unspecified: Secondary | ICD-10-CM | POA: Diagnosis present

## 2024-01-04 DIAGNOSIS — F17210 Nicotine dependence, cigarettes, uncomplicated: Secondary | ICD-10-CM | POA: Insufficient documentation

## 2024-01-04 DIAGNOSIS — Z5900 Homelessness unspecified: Secondary | ICD-10-CM | POA: Diagnosis not present

## 2024-01-04 DIAGNOSIS — F1920 Other psychoactive substance dependence, uncomplicated: Secondary | ICD-10-CM | POA: Diagnosis not present

## 2024-01-04 LAB — COMPREHENSIVE METABOLIC PANEL WITH GFR
ALT: 103 U/L — ABNORMAL HIGH (ref 0–44)
AST: 85 U/L — ABNORMAL HIGH (ref 15–41)
Albumin: 3.9 g/dL (ref 3.5–5.0)
Alkaline Phosphatase: 77 U/L (ref 38–126)
Anion gap: 13 (ref 5–15)
BUN: 5 mg/dL — ABNORMAL LOW (ref 6–20)
CO2: 21 mmol/L — ABNORMAL LOW (ref 22–32)
Calcium: 9.2 mg/dL (ref 8.9–10.3)
Chloride: 99 mmol/L (ref 98–111)
Creatinine, Ser: 0.94 mg/dL (ref 0.61–1.24)
GFR, Estimated: >60 mL/min (ref >60)
Glucose, Bld: 117 mg/dL — ABNORMAL HIGH (ref 70–99)
Potassium: 3.2 mmol/L — ABNORMAL LOW (ref 3.5–5.1)
Sodium: 133 mmol/L — ABNORMAL LOW (ref 135–145)
Total Bilirubin: 1.5 mg/dL — ABNORMAL HIGH (ref 0.0–1.2)
Total Protein: 9.1 g/dL — ABNORMAL HIGH (ref 6.5–8.1)

## 2024-01-04 LAB — RAPID URINE DRUG SCREEN, HOSP PERFORMED
Amphetamines: POSITIVE — AB
Barbiturates: NOT DETECTED
Benzodiazepines: NOT DETECTED
Cocaine: POSITIVE — AB
Opiates: NOT DETECTED
Tetrahydrocannabinol: NOT DETECTED

## 2024-01-04 LAB — CBC WITH DIFFERENTIAL/PLATELET
Abs Immature Granulocytes: 0.03 K/uL (ref 0.00–0.07)
Basophils Absolute: 0.1 K/uL (ref 0.0–0.1)
Basophils Relative: 1 %
Eosinophils Absolute: 0.3 K/uL (ref 0.0–0.5)
Eosinophils Relative: 3 %
HCT: 41.9 % (ref 39.0–52.0)
Hemoglobin: 14.4 g/dL (ref 13.0–17.0)
Immature Granulocytes: 0 %
Lymphocytes Relative: 25 %
Lymphs Abs: 2.5 K/uL (ref 0.7–4.0)
MCH: 31 pg (ref 26.0–34.0)
MCHC: 34.4 g/dL (ref 30.0–36.0)
MCV: 90.3 fL (ref 80.0–100.0)
Monocytes Absolute: 1.6 K/uL — ABNORMAL HIGH (ref 0.1–1.0)
Monocytes Relative: 16 %
Neutro Abs: 5.5 K/uL (ref 1.7–7.7)
Neutrophils Relative %: 55 %
Platelets: 422 K/uL — ABNORMAL HIGH (ref 150–400)
RBC: 4.64 MIL/uL (ref 4.22–5.81)
RDW: 12.8 % (ref 11.5–15.5)
WBC: 9.8 K/uL (ref 4.0–10.5)
nRBC: 0 % (ref 0.0–0.2)

## 2024-01-04 LAB — ETHANOL: Alcohol, Ethyl (B): <15 mg/dL (ref <15)

## 2024-01-04 LAB — HIV ANTIBODY (ROUTINE TESTING W REFLEX): HIV Screen 4th Generation wRfx: NONREACTIVE

## 2024-01-04 MED ORDER — ZIPRASIDONE MESYLATE 20 MG IM SOLR
20.0000 mg | Freq: Once | INTRAMUSCULAR | Status: AC
  Administered 2024-01-04: 20 mg via INTRAMUSCULAR
  Filled 2024-01-04: qty 20

## 2024-01-04 MED ORDER — ARIPIPRAZOLE 5 MG PO TABS
5.0000 mg | ORAL_TABLET | Freq: Every day | ORAL | 0 refills | Status: DC
  Filled 2024-01-04: qty 15, 15d supply, fill #0

## 2024-01-04 MED ADMIN — Aripiprazole Tab 5 MG: 5 mg | ORAL | NDC 65162089703

## 2024-01-04 MED FILL — Aripiprazole Tab 5 MG: 5.0000 mg | ORAL | Qty: 1 | Status: AC

## 2024-01-04 NOTE — ED Notes (Signed)
 IVC paperwork has been completed, Affidavit and first exam have been up loaded to chart. Waiting on finding and custody papers

## 2024-01-04 NOTE — ED Provider Notes (Signed)
 Emergency Medicine Observation Re-evaluation Note  Jerry [name] is a 48 y.o. male, seen on rounds today.  Pt initially presented to the ED for complaints of Hallucinations Currently, the patient is resting.  Physical Exam  BP 130/62 (BP Location: Left Arm)   Pulse 99   Temp 98.2 F (36.8 C) (Oral)   Resp 20   Wt 72.6 kg   SpO2 97%   BMI 23.64 kg/m  Physical Exam General: Resting Cardiac: Well-perfused Lungs: No respiratory distress Psych: Calm  ED Course / MDM  EKG:   I have reviewed the labs performed to date as well as medications administered while in observation. Clinical Course as of 01/04/24 1322  Fri Jan 04, 2024  1022 Discussed with psychiatry who states that patient is cleared for discharge.  He is A&O x 4 at this time.  Psych recommends rescinding the IVC and discharging patient to home.  Psych placed orders for Abilify  refill in hand.  Patient is given resources to go home with and instructed to follow-up within 1 to 2 weeks. [HN]    Clinical Course User Index [HN] Franklyn Sid SAILOR, MD     Plan  DC w/ discharge instructions/return precautions. All questions answered to patient's satisfaction.      Franklyn Sid SAILOR, MD 01/04/24 1322

## 2024-01-04 NOTE — ED Notes (Signed)
 Called Magistrate Office was told call at 8:30 .

## 2024-01-04 NOTE — ED Notes (Signed)
 Original copy in red magistrate folder,1copy in medical record, 3 copies on purple clipboard

## 2024-01-04 NOTE — ED Triage Notes (Signed)
 Pt in from [place] via [place], pt called EMS for hallucinations. States he has been out of his prescribed Haldol  x 4 days. Per EMS, pt paranoid someone will kill him, very jumpy. EMS unable to get VS

## 2024-01-04 NOTE — ED Notes (Signed)
 [id number] case ,Pt was been recinded and forms have been uploaded.
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
## 2024-01-04 NOTE — ED Notes (Signed)
 Unable to obtain VS - pt will not remain still enough

## 2024-01-04 NOTE — ED Notes (Signed)
 TTS consult in progress.
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
## 2024-01-04 NOTE — Discharge Instructions (Addendum)
 Please consider close outpatient follow-up with substance abuse resources given upon discharge, if/when amenable Please adhere strictly to safety return precautions discussed today Please continue to take currently prescribed outpatient psychiatric medication regimen; medications have been given to you upon discharge today Please consider utilization of shelter resources given today upon discharge, if/when amenable Please consider close outpatient follow-up with medication management/therapy services, if/when amenable

## 2024-01-04 NOTE — Discharge Instructions (Addendum)
 Recommendations:  [facility]-patient provided with this information, educated to call for an intake.  CSW has already confirmed that there are beds available, but patient will have to place the call for an interview in order to be accepted.  Patient educated on this, verbalizes understanding, denies any safety concerns regarding discharge.  Educated as follows: Get help right away if: You have thoughts about hurting yourself or others. Get help right away if you feel like you may hurt yourself or others, or have thoughts about taking your own life. Go to your nearest emergency room or: Call 911. Call the National Suicide Prevention Lifeline at 972-547-6036 or 988 in the [place] This is open 24 hours a day. If you're a Veteran: Call 988 and press 1. This is open 24 hours a day. Text the [employer] at 386-249-2982. Summary Mental health is not just the absence of mental illness. It involves understanding your emotions and behaviors, and taking steps to manage them in a healthy way. If you have symptoms of mental or emotional distress, get help from family, friends, a health care provider, or a mental health professional. Practice good mental health behaviors such as stress management skills, self-calming skills, exercise, healthy sleeping and eating, and supportive relationships. This information is not intended to replace advice given to you by your health care provider. Make sure you discuss any questions you have with your health care provider.  Education provided on the fact that if experiencing worsening of psychiatry symptoms including suicidal ideations, homicidal ideations, or having auditory/visual hallucinations, etc, to call 911, 988, come back to this location, or go to the nearest ER. Pt verbalized understanding.

## 2024-01-04 NOTE — ED Notes (Signed)
TTS consult completed 

## 2024-01-04 NOTE — ED Provider Notes (Signed)
 Behavioral Health Urgent Care Medical Screening Exam  Patient Name: Jerry [name] MRN: [id number] Date of Evaluation: 01/04/24 Chief Complaint:  homelessness Diagnosis:  Final diagnoses:  Polysubstance (excluding opioids) dependence (HCC)  Cocaine abuse (HCC)  Methamphetamine abuse (HCC)   History of Present illness: Per triage: [name] is a 89Y male presenting to [facility] vol via [employer]. Pt appears disorganized and paranoid upon assessment. Pt states that he feels paranoid and that he is having SI with a plan to run in front of a car. Pt states that people have been messing with him for years and that they are after him. Pt has a hx of bipolar disorder and paranoid schizophernia. Pt denies HI and substance use. Pt endorses AVH stating he is seeing and hearing people have conversations.  Assessment:  during encounter with patient, main focus is on homelessness, and lack of a shelter, reports wanting to be discharged, so that he can stop being outside Because it is getting cold.  His suicidal ideations seem to be chronic,  with  frequent visits to the ER and the [facility] in an attempt to get secondary gains of housing from being hospitalized.  Patient presented to the ER earlier today morning, was discharged, and now presenting to this location with same request.  Suicide Risk Assessment  SUICIDE RISK: Chronic: Suicidal ideations seem to be in oneself secondary gains of shelter and food from an inpatient hospitalization. Frequent, intense, and enduring suicidal ideations per chart review. Currently presenting with passive SI. denies current plan or intent to harm self., evidence of impaired self-control as evidenced by his impaired substance use even though it is negatively impacting his mental health. severe dysphoria/symptomatology, multiple risk factors present, and few if any protective factors, particularly a lack of social support.   Recommendations: CSW  provided patient with information on sober living of [place] to call and present there since they have beds.  Patient educated on this, provided with bus ticket prior to discharge.  Denies any intent or plan to harm himself after discharge.  Flowsheet Row ED from 01/04/2024 in [facility] Most recent reading at 01/04/2024  1:20 PM ED from 01/04/2024 in [facility] Emergency Department at [facility] Most recent reading at 01/04/2024 12:40 AM ED from 09/07/2023 in [facility] Most recent reading at 09/07/2023  3:05 PM  C-SSRS RISK CATEGORY No Risk No Risk Moderate Risk    Psychiatric Specialty Exam  Presentation  General Appearance:Casual  Eye Contact:Fair  Speech:Clear and Coherent  Speech Volume:Normal  Handedness:Right   Mood and Affect  Mood: Anxious  Affect: Congruent   Thought Process  Thought Processes: Coherent  Descriptions of Associations:Intact  Orientation:Full (Time, Place and Person)  Thought Content:Logical  Diagnosis of Schizophrenia or Schizoaffective disorder in past: No  Duration of Psychotic Symptoms: Greater than six months  Hallucinations:None telling me to kill myself  Ideas of Reference:None  Suicidal Thoughts:Yes, Passive  Homicidal Thoughts:No   Sensorium  Memory: Immediate Fair  Judgment: Fair  Insight: Fair   Art therapist  Concentration: Fair  Attention Span: Fair  Recall: [employer] of Knowledge: Fair  Language: Fair   Psychomotor Activity  Psychomotor Activity: Normal   Assets  Assets: Resilience   Sleep  Sleep: Fair  Number of hours:  8   Physical Exam: Physical Exam Vitals and nursing note reviewed.  Neurological:     General: No focal deficit present.  Mental Status: He is oriented to person, place, and time.    Review of Systems  Psychiatric/Behavioral:  Positive for depression and substance abuse.  Negative for hallucinations, memory loss and suicidal ideas. The patient is nervous/anxious and has insomnia.    Blood pressure (!) 153/90, pulse 95, temperature 98.2 F (36.8 C), temperature source Oral, resp. rate 20, SpO2 98%. There is no height or weight on file to calculate BMI.  Musculoskeletal: Strength & Muscle Tone: within normal limits Gait & Station: normal Patient leans: N/A   [facility] MSE Discharge Disposition for Follow up and Recommendations: Based on my evaluation the patient does not appear to have an emergency medical condition and can be discharged with resources and follow up care in outpatient services for Medication Management and Individual Therapy Plan is [facility] [place].  Jerry Snell, NP 01/04/2024, 4:19 PM

## 2024-01-04 NOTE — Progress Notes (Signed)
   01/04/24 1314  [facility] Triage Screening (Walk-ins at [facility] only)  How Did You Hear About Us ? Legal System  What Is the Reason for Your Visit/Call Today? [name] is a 66Y male presenting to [facility] vol via [employer]. Pt appears disorganized and paranoid upon assessment. Pt states that he feels paranoid and that he is having SI with a plan to run in front of a car. Pt states that people have been messing with him for years and that they are after him. Pt has a hx of bipolar disorder and paranoid schizophernia. Pt denies HI and substance use. Pt endorses AVH stating he is seeing and hearing people have conversations.  How Long Has This Been Causing You Problems? <Week  Have You Recently Had Any Thoughts About Hurting Yourself? Yes  How long ago did you have thoughts about hurting yourself? current  Are You Planning to Commit Suicide/Harm Yourself At This time? Yes  Have you Recently Had Thoughts About Hurting Someone Sherral? No  Are You Planning To Harm Someone At This Time? No  Physical Abuse Denies  Verbal Abuse Denies  Sexual Abuse Denies  Exploitation of patient/patient's resources Denies  Self-Neglect Denies  Are you currently experiencing any auditory, visual or other hallucinations? Yes  Please explain the hallucinations you are currently experiencing: seeing and hearing people  Have You Used Any Alcohol or Drugs in the Past 24 Hours? No  Do you have any current medical co-morbidities that require immediate attention? No  What Do You Feel Would Help You the Most Today? Treatment for Depression or other mood problem;Medication(s)  Determination of Need Urgent (48 hours)  Options For Referral [facility];Outpatient Therapy;Medication Management  Determination of Need filed? Yes

## 2024-01-04 NOTE — ED Triage Notes (Addendum)
 Pt to ED from unknown place. Pt arrives with several mental health complaints and self diagnosed laryngitis. Pt is rambling in triage about various issues with illicit substances, paranoia, and ramblings about being a sex offender. Pt also reports hallucinations and problems with his prescribed medications. Unable to obtain all triage info due to pt rambling and low volume of his voice. Arrives with an elevated HR, all other VSS. Pt is extremely anxious throughout triage. When asked about SI/HI pt has a flight of ideas and doesn't answer question directly.

## 2024-01-04 NOTE — ED Notes (Signed)
 Pt has returned wanting to be seen with [employer] at 18:56

## 2024-01-04 NOTE — ED Notes (Signed)
 Recalled Magistrate office gave enveloped number #[id number]

## 2024-01-04 NOTE — ED Notes (Signed)
 Pt. Dressed out in paper scrubs and socks. Pt belongings collected and put in locker 3.

## 2024-01-04 NOTE — ED Provider Triage Note (Signed)
 Emergency Medicine Provider Triage Evaluation Note  Jerry [name] , a 48 y.o. male  was evaluated in triage.  Pt complains of concern for STD. No penile dc Is anxious Denies SI/HI  Was evaluated earlier this morning for hallucinations and was cleared by psychiatry.  Discharge for outpatient psychiatry  Saw [facility] a couple hours ago for SI who recommended Based on my evaluation the patient does not appear to have an emergency medical condition and can be discharged with resources and follow up care in outpatient services for Medication Management and Individual Therapy   Review of Systems  Positive: See hpi Negative:   Physical Exam  BP (!) 122/99 (BP Location: Right Arm)   Pulse (!) 136   Temp 97.8 F (36.6 C) (Oral)   Resp 20   Ht 5' 9 (1.753 m)   Wt 72.6 kg   SpO2 99%   BMI 23.62 kg/m  Gen:   Awake, no distress   Resp:  Normal effort  MSK:   Moves extremities without difficulty  Other:    Medical Decision Making  Medically screening exam initiated at 8:52 PM.  Appropriate orders placed.  AMARI ZAGAL was informed that the remainder of the evaluation will be completed by another provider, this initial triage assessment does not replace that evaluation, and the importance of remaining in the ED until their evaluation is complete.  Ordered STD testing   Minnie Tinnie BRAVO, [place] 01/04/24 2054

## 2024-01-05 ENCOUNTER — Encounter (HOSPITAL_COMMUNITY): Payer: Self-pay

## 2024-01-05 ENCOUNTER — Emergency Department (HOSPITAL_COMMUNITY)
Admission: EM | Admit: 2024-01-05 | Discharge: 2024-01-05 | Disposition: A | Discharge status: Home / Self Care | Payer: MEDICAID | Attending: Emergency Medicine | Admitting: Emergency Medicine

## 2024-01-05 ENCOUNTER — Other Ambulatory Visit: Payer: Self-pay

## 2024-01-05 DIAGNOSIS — Z760 Encounter for issue of repeat prescription: Secondary | ICD-10-CM | POA: Insufficient documentation

## 2024-01-05 LAB — RPR: RPR Ser Ql: NONREACTIVE

## 2024-01-05 MED ORDER — ARIPIPRAZOLE 5 MG PO TABS
5.0000 mg | ORAL_TABLET | Freq: Once | ORAL | Status: AC
  Administered 2024-01-05: 5 mg via ORAL
  Filled 2024-01-05: qty 1

## 2024-01-05 NOTE — ED Provider Notes (Signed)
 [place] EMERGENCY DEPARTMENT AT [facility] Provider Note   CSN: [id number] Arrival date & time: 01/04/24  1630     Patient presents with: Psychiatric Evaluation and Sore Throat   Jerry [name] is a 48 y.o. male with a significant psychiatric history presents today for STD concern.  Patient denies any penile discharge, fever, chills, nausea, vomiting, dysuria, hematuria, any other complaints at this time.    Sore Throat       Prior to Admission medications   Medication Sig Start Date End Date Taking? Authorizing Provider  ARIPiprazole  (ABILIFY ) 5 MG tablet Take 1 tablet (5 mg total) by mouth daily. 01/05/24   Mannie Jerel PARAS, NP    Allergies: Penicillins    Review of Systems  Updated Vital Signs BP 124/79   Pulse 72   Temp 98.7 F (37.1 C)   Resp 18   Ht 5' 9 (1.753 m)   Wt 72.6 kg   SpO2 96%   BMI 23.62 kg/m   Physical Exam Vitals and nursing note reviewed.  Constitutional:      General: He is not in acute distress.    Appearance: He is well-developed. He is not toxic-appearing.  HENT:     Head: Normocephalic and atraumatic.  Eyes:     Conjunctiva/sclera: Conjunctivae normal.  Cardiovascular:     Rate and Rhythm: Normal rate and regular rhythm.     Heart sounds: No murmur heard. Pulmonary:     Effort: Pulmonary effort is normal. No respiratory distress.     Breath sounds: Normal breath sounds.  Abdominal:     Palpations: Abdomen is soft.     Tenderness: There is no abdominal tenderness.  Musculoskeletal:        General: No swelling.     Cervical back: Neck supple.  Skin:    General: Skin is warm and dry.     Capillary Refill: Capillary refill takes less than 2 seconds.  Neurological:     Mental Status: He is alert.  Psychiatric:        Mood and Affect: Mood normal.     (all labs ordered are listed, but only abnormal results are displayed) Labs Reviewed  HIV ANTIBODY (ROUTINE TESTING W REFLEX)  RPR  GC/CHLAMYDIA PROBE AMP  ([place]) NOT AT [facility]    EKG: None  Radiology: No results found.   Procedures   Medications Ordered in the ED - No data to display                                  Medical Decision Making Amount and/or Complexity of Data Reviewed Labs: ordered.   This patient presents to the ED for concern of STD differential diagnosis includes syphilis, HIV, gonorrhea, chlamydia  Lab Tests:  I Ordered, and personally interpreted labs.  The pertinent results include: HIV nonreactive RPR and GC chlamydia pending  Problem List / ED Course:  Sitter at for admission or further workup however patient's vital signs, physical exam, and labs have been reassuring.  Patient informed that he has 1 or more labs pending will be notified of these results require treatment.  I feel patient safe for discharge at this time.      Final diagnoses:  Concern about STD in male without diagnosis    ED Discharge Orders     None          Francis Ileana SAILOR, PA-C 01/05/24 9896  Trine Raynell Moder, MD 01/05/24 [phone]

## 2024-01-05 NOTE — Discharge Instructions (Signed)
 Today you were seen for concern for STD.  You have 1 or more labs pending and will be notified if these results require treatment.  Thank you for letting us  treat you today. After reviewing your labs, I feel you are safe to go home. Please follow up with your PCP in the next several days and provide them with your records from this visit. Return to the Emergency Room if pain becomes severe or symptoms worsen.

## 2024-01-05 NOTE — ED Notes (Signed)
Pt provided with bus pass

## 2024-01-05 NOTE — Discharge Instructions (Signed)
 Your workup had been stable to prior.  You have been given a dose of your Abilify  here.  Your hallucinations are more chronic and also potentially related to your substance use.  Follow-up with behavioral health urgent care as needed.

## 2024-01-05 NOTE — ED Triage Notes (Signed)
 Pt was just Dc'd and is now checking back in. States he needs his psych meds. No changes since last visit less than an hour ago. VSS, NADN.

## 2024-01-06 ENCOUNTER — Other Ambulatory Visit: Payer: Self-pay

## 2024-01-06 ENCOUNTER — Emergency Department (HOSPITAL_COMMUNITY)
Admission: EM | Admit: 2024-01-06 | Discharge: 2024-01-07 | Disposition: A | Discharge status: Home / Self Care | Payer: MEDICAID | Source: Home / Self Care | Attending: Emergency Medicine | Admitting: Emergency Medicine

## 2024-01-06 ENCOUNTER — Encounter (HOSPITAL_COMMUNITY): Payer: Self-pay

## 2024-01-06 ENCOUNTER — Emergency Department (HOSPITAL_COMMUNITY)
Admission: EM | Admit: 2024-01-06 | Discharge: 2024-01-06 | Disposition: A | Discharge status: Home / Self Care | Payer: MEDICAID | Attending: Emergency Medicine | Admitting: Emergency Medicine

## 2024-01-06 DIAGNOSIS — F1994 Other psychoactive substance use, unspecified with psychoactive substance-induced mood disorder: Secondary | ICD-10-CM | POA: Diagnosis present

## 2024-01-06 DIAGNOSIS — F209 Schizophrenia, unspecified: Secondary | ICD-10-CM | POA: Insufficient documentation

## 2024-01-06 DIAGNOSIS — F1099 Alcohol use, unspecified with unspecified alcohol-induced disorder: Secondary | ICD-10-CM | POA: Diagnosis not present

## 2024-01-06 DIAGNOSIS — F1090 Alcohol use, unspecified, uncomplicated: Secondary | ICD-10-CM

## 2024-01-06 DIAGNOSIS — R490 Dysphonia: Secondary | ICD-10-CM | POA: Insufficient documentation

## 2024-01-06 DIAGNOSIS — Z5900 Homelessness unspecified: Secondary | ICD-10-CM | POA: Insufficient documentation

## 2024-01-06 DIAGNOSIS — F1914 Other psychoactive substance abuse with psychoactive substance-induced mood disorder: Secondary | ICD-10-CM | POA: Insufficient documentation

## 2024-01-06 DIAGNOSIS — F1590 Other stimulant use, unspecified, uncomplicated: Secondary | ICD-10-CM

## 2024-01-06 DIAGNOSIS — F332 Major depressive disorder, recurrent severe without psychotic features: Secondary | ICD-10-CM

## 2024-01-06 DIAGNOSIS — F22 Delusional disorders: Secondary | ICD-10-CM | POA: Diagnosis not present

## 2024-01-06 DIAGNOSIS — F1510 Other stimulant abuse, uncomplicated: Secondary | ICD-10-CM | POA: Insufficient documentation

## 2024-01-06 DIAGNOSIS — R45851 Suicidal ideations: Secondary | ICD-10-CM | POA: Insufficient documentation

## 2024-01-06 LAB — URINALYSIS, ROUTINE W REFLEX MICROSCOPIC
Bilirubin Urine: NEGATIVE
Glucose, UA: NEGATIVE mg/dL
Ketones, ur: 5 mg/dL — AB
Leukocytes,Ua: NEGATIVE
Nitrite: NEGATIVE
Protein, ur: 100 mg/dL — AB
Specific Gravity, Urine: 1.026 (ref 1.005–1.030)
pH: 5 (ref 5.0–8.0)

## 2024-01-06 LAB — CBC
HCT: 38.8 % — ABNORMAL LOW (ref 39.0–52.0)
Hemoglobin: 13 g/dL (ref 13.0–17.0)
MCH: 30.6 pg (ref 26.0–34.0)
MCHC: 33.5 g/dL (ref 30.0–36.0)
MCV: 91.3 fL (ref 80.0–100.0)
Platelets: 404 K/uL — ABNORMAL HIGH (ref 150–400)
RBC: 4.25 MIL/uL (ref 4.22–5.81)
RDW: 13.2 % (ref 11.5–15.5)
WBC: 7.8 K/uL (ref 4.0–10.5)
nRBC: 0 % (ref 0.0–0.2)

## 2024-01-06 LAB — COMPREHENSIVE METABOLIC PANEL WITH GFR
ALT: 132 U/L — ABNORMAL HIGH (ref 0–44)
AST: 107 U/L — ABNORMAL HIGH (ref 15–41)
Albumin: 4.2 g/dL (ref 3.5–5.0)
Alkaline Phosphatase: 77 U/L (ref 38–126)
Anion gap: 14 (ref 5–15)
BUN: 10 mg/dL (ref 6–20)
CO2: 22 mmol/L (ref 22–32)
Calcium: 9.3 mg/dL (ref 8.9–10.3)
Chloride: 99 mmol/L (ref 98–111)
Creatinine, Ser: 0.85 mg/dL (ref 0.61–1.24)
GFR, Estimated: >60 mL/min (ref >60)
Glucose, Bld: 104 mg/dL — ABNORMAL HIGH (ref 70–99)
Potassium: 3.3 mmol/L — ABNORMAL LOW (ref 3.5–5.1)
Sodium: 135 mmol/L (ref 135–145)
Total Bilirubin: 0.7 mg/dL (ref 0.0–1.2)
Total Protein: 8.5 g/dL — ABNORMAL HIGH (ref 6.5–8.1)

## 2024-01-06 LAB — URINE DRUG SCREEN
Amphetamines: POSITIVE — AB
Barbiturates: NEGATIVE
Benzodiazepines: POSITIVE — AB
Cocaine: POSITIVE — AB
Fentanyl: NEGATIVE
Methadone Scn, Ur: NEGATIVE
Opiates: NEGATIVE
Tetrahydrocannabinol: POSITIVE — AB

## 2024-01-06 LAB — ACETAMINOPHEN LEVEL: Acetaminophen (Tylenol), Serum: <10 ug/mL — ABNORMAL LOW (ref 10–30)

## 2024-01-06 LAB — SALICYLATE LEVEL: Salicylate Lvl: <7 mg/dL — ABNORMAL LOW (ref 7.0–30.0)

## 2024-01-06 LAB — ETHANOL: Alcohol, Ethyl (B): <15 mg/dL (ref <15)

## 2024-01-06 MED ORDER — ARIPIPRAZOLE 5 MG PO TABS
5.0000 mg | ORAL_TABLET | Freq: Every day | ORAL | Status: DC
  Administered 2024-01-06: 5 mg via ORAL
  Filled 2024-01-06: qty 1

## 2024-01-06 MED ORDER — LORAZEPAM 1 MG PO TABS: 0.0000 mg | ORAL_TABLET | Freq: Two times a day (BID) | ORAL | Status: DC

## 2024-01-06 MED ORDER — POTASSIUM CHLORIDE CRYS ER 20 MEQ PO TBCR
40.0000 meq | EXTENDED_RELEASE_TABLET | Freq: Once | ORAL | Status: AC
  Administered 2024-01-06: 40 meq via ORAL
  Filled 2024-01-06: qty 2

## 2024-01-06 MED ORDER — LORAZEPAM 1 MG PO TABS
1.0000 mg | ORAL_TABLET | ORAL | Status: DC | PRN
  Filled 2024-01-06: qty 1

## 2024-01-06 MED ORDER — THIAMINE MONONITRATE 100 MG PO TABS
100.0000 mg | ORAL_TABLET | Freq: Every day | ORAL | Status: DC
  Administered 2024-01-06: 100 mg via ORAL
  Filled 2024-01-06: qty 1

## 2024-01-06 MED ORDER — LORAZEPAM 2 MG/ML IJ SOLN: 1.0000 mg | INTRAMUSCULAR | Status: DC | PRN

## 2024-01-06 MED ORDER — LORAZEPAM 1 MG PO TABS
1.0000 mg | ORAL_TABLET | Freq: Once | ORAL | Status: AC
  Administered 2024-01-06: 1 mg via ORAL
  Filled 2024-01-06: qty 1

## 2024-01-06 MED ORDER — ADULT MULTIVITAMIN W/MINERALS CH
1.0000 | ORAL_TABLET | Freq: Every day | ORAL | Status: DC
  Administered 2024-01-06: 1 via ORAL
  Filled 2024-01-06: qty 1

## 2024-01-06 MED ORDER — MAGNESIUM OXIDE -MG SUPPLEMENT 400 (240 MG) MG PO TABS
400.0000 mg | ORAL_TABLET | Freq: Every day | ORAL | Status: DC
Start: 2024-01-06 — End: 2024-01-06
  Administered 2024-01-06: 400 mg via ORAL
  Filled 2024-01-06: qty 1

## 2024-01-06 MED ORDER — LORAZEPAM 1 MG PO TABS
0.0000 mg | ORAL_TABLET | Freq: Four times a day (QID) | ORAL | Status: DC
  Administered 2024-01-06 (×2): 1 mg via ORAL
  Filled 2024-01-06: qty 1

## 2024-01-06 MED ORDER — FOLIC ACID 1 MG PO TABS
1.0000 mg | ORAL_TABLET | Freq: Every day | ORAL | Status: DC
  Administered 2024-01-06: 1 mg via ORAL
  Filled 2024-01-06: qty 1

## 2024-01-06 MED ORDER — THIAMINE HCL 100 MG/ML IJ SOLN: 100.0000 mg | Freq: Every day | INTRAMUSCULAR | Status: DC

## 2024-01-06 NOTE — ED Notes (Signed)
 Attempted to collect blood x2 by this RN without success.

## 2024-01-06 NOTE — ED Provider Notes (Signed)
 [place] EMERGENCY DEPARTMENT AT [facility] Provider Note   CSN: [id number] Arrival date & time: 01/06/24  0107     Patient presents with: Psychiatric Evaluation   Jerry [name] is a 48 y.o. male.   Patient history of schizophrenia, polysubstance abuse, depression, anxiety here with suicidal thoughts.  Symptoms ongoing for the past 3 to 4 days.  Has plan to walk out into traffic.  Was seen for similar 2 days ago and psychiatrically cleared.  States he is getting worse and still has a plan to walk into traffic and wants to hurt himself.  Denies homicidal thoughts denies hearing voices.  Does admit to using meth and crack last use was 12 days ago.  Did have 1 beer tonight.  Denies chest pain, shortness of breath, abdominal pain, nausea or vomiting.  Denies hearing any voices.  The history is provided by the patient.       Prior to Admission medications   Medication Sig Start Date End Date Taking? Authorizing Provider  ARIPiprazole  (ABILIFY ) 5 MG tablet Take 1 tablet (5 mg total) by mouth daily. 01/05/24   Mannie Jerel PARAS, NP    Allergies: Penicillins    Review of Systems  Constitutional:  Negative for activity change, appetite change and fever.  HENT:  Negative for congestion and rhinorrhea.   Respiratory:  Negative for cough, chest tightness and shortness of breath.   Cardiovascular:  Negative for chest pain.  Gastrointestinal:  Negative for abdominal pain, nausea and vomiting.  Genitourinary:  Negative for dysuria and hematuria.  Musculoskeletal:  Negative for arthralgias and myalgias.  Skin:  Negative for wound.  Neurological:  Negative for dizziness, weakness and headaches.  Psychiatric/Behavioral:  Positive for self-injury, sleep disturbance and suicidal ideas. The patient is nervous/anxious.    all other systems are negative except as noted in the HPI and PMH.    Updated Vital Signs BP (!) 143/94 (BP Location: Left Arm)   Pulse (!) 110   Temp 98.8 F  (37.1 C) (Oral)   Resp 17   SpO2 100%   Physical Exam Vitals and nursing note reviewed.  Constitutional:      General: He is not in acute distress.    Appearance: He is well-developed.  HENT:     Head: Normocephalic and atraumatic.     Mouth/Throat:     Pharynx: No oropharyngeal exudate.     Comments: Hoarse voice, erythematous oropharynx, uvula midline Eyes:     Conjunctiva/sclera: Conjunctivae normal.     Pupils: Pupils are equal, round, and reactive to light.  Neck:     Comments: No meningismus. Cardiovascular:     Rate and Rhythm: Regular rhythm. Tachycardia present.     Heart sounds: Normal heart sounds. No murmur heard. Pulmonary:     Effort: Pulmonary effort is normal. No respiratory distress.     Breath sounds: Normal breath sounds.  Abdominal:     Palpations: Abdomen is soft.     Tenderness: There is no abdominal tenderness. There is no guarding or rebound.  Musculoskeletal:        General: No tenderness. Normal range of motion.     Cervical back: Normal range of motion and neck supple.  Skin:    General: Skin is warm.  Neurological:     Mental Status: He is alert and oriented to person, place, and time.     Cranial Nerves: No cranial nerve deficit.     Motor: No abnormal muscle tone.  Coordination: Coordination normal.     Comments:  5/5 strength throughout. CN 2-12 intact.Equal grip strength.   Psychiatric:        Behavior: Behavior normal.     (all labs ordered are listed, but only abnormal results are displayed) Labs Reviewed  COMPREHENSIVE METABOLIC PANEL WITH GFR - Abnormal; Notable for the following components:      Result Value   Potassium 3.3 (*)    Glucose, Bld 104 (*)    Total Protein 8.5 (*)    AST 107 (*)    ALT 132 (*)    All other components within normal limits  CBC - Abnormal; Notable for the following components:   HCT 38.8 (*)    Platelets 404 (*)    All other components within normal limits  ACETAMINOPHEN  LEVEL - Abnormal;  Notable for the following components:   Acetaminophen  (Tylenol ), Serum <10 (*)    All other components within normal limits  SALICYLATE LEVEL - Abnormal; Notable for the following components:   Salicylate Lvl <7.0 (*)    All other components within normal limits  ETHANOL  URINE DRUG SCREEN  URINALYSIS, ROUTINE W REFLEX MICROSCOPIC    EKG: None  Radiology: No results found.   Procedures   Medications Ordered in the ED - No data to display                                  Medical Decision Making Amount and/or Complexity of Data Reviewed Labs: ordered. Decision-making details documented in ED Course. Radiology: ordered and independent interpretation performed. Decision-making details documented in ED Course. ECG/medicine tests: ordered and independent interpretation performed. Decision-making details documented in ED Course.  Risk OTC drugs. Prescription drug management.   Suicidal with plan to walk into traffic.  Tachycardic on arrival.  Similar evaluation several days ago and psychiatrically cleared.  Admits to using crack and meth.  No homicidal thoughts or hallucinations.  Labs are remarkable for transaminitis similar to previous. Ethanol level undetectable.  Acetaminophen  and salicylate level undetectable.  Patient medically clear for TTS evaluation.  Holding orders are placed.      Final diagnoses:  None    ED Discharge Orders     None          Kariana Wiles, Garn[place], MD 01/06/24 [phone]
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
## 2024-01-06 NOTE — Discharge Instructions (Signed)
 Jerry [name]  Thank you for allowing us  to take care of you today.  You came to the Emergency Department today because you are having thoughts of hurting yourself, you are feeling better after staying here in the emergency department, psychiatry saw you and feels that you are stable for outpatient management.  Your potassium was a little bit low while you are here in the emergency department, we gave you some potassium supplementation by mouth, you should follow-up with your PCP in 1 to 2 weeks for a recheck of this level.  To-Do: 1. Please follow-up with your primary doctor within 1 - 2 weeks / as soon as possible.   Please return to the Emergency Department or call 911 if you experience have worsening of your symptoms, or do not get better, chest pain, shortness of breath, severe or significantly worsening pain, high fever, severe confusion, pass out or have any reason to think that you need emergency medical care.   We hope you feel better soon.   Department of Emergency Medicine [facility]

## 2024-01-06 NOTE — ED Triage Notes (Addendum)
 Pt presents via EMS c/o paranoia, stating people are trying to kill him while at [place].   Reports auditory and visual hallucinations. Denies SI/HI. Cooperative in triage at this time.

## 2024-01-06 NOTE — ED Notes (Signed)
 Pt dressed in hospital attire and belongings collected per hospital policy.

## 2024-01-06 NOTE — ED Provider Notes (Signed)
  [facility] EMERGENCY DEPARTMENT AT [facility] Provider Note   CSN: [id number] Arrival date & time: 01/05/24  0153     Patient presents with: Medication Refill   Jerry [name] is a 48 y.o. male.    Medication Refill Patient presents for medication.  Just in the ER and after discharge check back in.  Has now had 4 visits in the last day and a half.  Has been at behavioral urgent care and diagnosed with malingering.  Does have history of schizophrenia.  It appears that he was given a prescription for the Abilify  earlier today.  States he is hallucinating and has some suicidal thoughts.    Past Medical History:  Diagnosis Date   Anxiety    Bipolar 1 disorder (HCC)    Depression    Drug-induced seizure (HCC)    EtOH dependence (HCC)    Hallucination    Paranoid schizophrenia (HCC)    since pt's 20's   PTSD (post-traumatic stress disorder)     Prior to Admission medications   Medication Sig Start Date End Date Taking? Authorizing Provider  ARIPiprazole  (ABILIFY ) 5 MG tablet Take 1 tablet (5 mg total) by mouth daily. 01/05/24   Mannie Jerel PARAS, NP    Allergies: Penicillins    Review of Systems  Updated Vital Signs BP (!) 135/96   Pulse (!) 124   Temp 98.9 F (37.2 C) (Oral)   Resp 17   SpO2 100%   Physical Exam Vitals and nursing note reviewed.  Cardiovascular:     Rate and Rhythm: Normal rate.  Neurological:     Mental Status: He is alert. Mental status is at baseline.     (all labs ordered are listed, but only abnormal results are displayed) Labs Reviewed - No data to display  EKG: None  Radiology: No results found.   Procedures   Medications Ordered in the ED  ARIPiprazole  (ABILIFY ) tablet 5 mg (5 mg Oral Given 01/05/24 0736)                                    Medical Decision Making Risk Prescription drug management.   Patient presents requesting medication.  Also states he needs somewhere to go.  Reviewing records it  appears that he has been seen for this already 2-3 times in the last day.  Has been seen by psychiatry and cleared.  It appears that his depression and his hallucinations appear to be at their baseline according to notes.  Will discharge home after giving his Abilify  and can follow-up with behavioral health urgent care as needed.     Final diagnoses:  Medication refill    ED Discharge Orders     None          Patsey Lot, MD 01/06/24 [phone]

## 2024-01-06 NOTE — ED Provider Notes (Incomplete Revision)
  [place] EMERGENCY DEPARTMENT AT [facility] Emergency Medicine Observation Re-evaluation Note  Jerry [name] is a 48 y.o. male, seen on rounds today.  Pt initially presented on 01/06/24 at 0107 to the ED for complaints of  Chief Complaint  Patient presents with   Psychiatric Evaluation  Presented to the emergency department for suicidal ideation with plan, is currently under voluntary status. PMHx: schizophrenia, polysubstance abuse, depression, anxiety  Currently, the patient is resting on stretcher, speaking with psychiatry team.  Physical Exam  BP 123/76 (BP Location: Left Arm)   Pulse 84   Temp 98.5 F (36.9 C) (Oral)   Resp 18   SpO2 98%  Physical Exam General: NAD Lungs: Normal effort Psych: Currently calm  ED Course / MDM  EKG:EKG Interpretation Date/Time:  Sunday January 06 2024 11:23:57 EDT Ventricular Rate:  86 PR Interval:  156 QRS Duration:  96 QT Interval:  402 QTC Calculation: 481 R Axis:   88  Text Interpretation: Sinus rhythm with marked sinus arrhythmia with Premature atrial complexes with Abberant conduction Prolonged QT similar to prior Abnormal ECG When compared with ECG of 06-Sep-2023 16:19, PREVIOUS ECG IS PRESENT Confirmed by Rogelia Satterfield ([id number]) on 01/06/2024 12:22:36 PM  I have reviewed the labs performed to date as well as medications administered while in observation.  Recent changes in the last 24 hours include EKG with mildly prolonged QTc, similar to prior, started on magnesium  tablets.  Pending psychiatry recommendations Home medications: Ordered by prior team Diet: By prior team  Plan  Current plan is for psychiatry recommendations.  feel that he is stable for discharge with outpatient follow-up  Rogelia Satterfield RAMAN, MD 01/06/24 1225

## 2024-01-06 NOTE — Consult Note (Signed)
 [facility] Consult Follow-up  Patient Name: .MIDAS DAUGHETY  MRN: [id number]  DOB: 01/15/1976  Consult Order details: Suicidal Orders (From admission, onward)     Start     Ordered   01/06/24 0139  CONSULT TO CALL ACT TEAM       Ordering Provider: Carita Senior, MD  Provider:  (Not yet assigned)  Question:  Reason for Consult?  Answer:  suicidal   01/06/24 0138             Mode of Visit: In person    Psychiatry Consult Evaluation  Service Date: January 06, 2024 LOS:  LOS: 0 days  Chief Complaint Suicidal  Primary Psychiatric Diagnoses  MDD, recurrent, severe 2.  Methamphetamine use 3.  Alcohol use 4.  Substance-induced mood disorder  Assessment  DAYMEIN NUNNERY is a 48 y.o. male admitted: Presented to the ED  01/06/2024  1:09 AM for suicidal thoughts. He carries the psychiatric diagnoses of major depressive disorder, methamphetamine use disorder, alcohol use disorder, cocaine use disorder, PTSD, schizoaffective disorder, bipolar type, schizophrenia spectrum disorder with psychotic disorder type not yet determined and has a past medical history of alcohol withdrawal.   His current presentation of suicidal ideation is most consistent with substance-induced mood disorder. He meets criteria for outpatient psychiatry follow-up based on denial of suicidal ideation currently with ability to contract verbally for safety at this time.  Current outpatient psychotropic medications include aripiprazole  5 mg daily and historically he has had a therapeutic response to these medications. He was inconsistently compliant with medications prior to admission as evidenced by patient report. On initial examination, patient insightful, agrees with current plan. Please see plan below for detailed recommendations.   Diagnoses:  Active Hospital problems: Principal Problem:   Substance induced mood disorder (HCC)    Plan   ## Psychiatric Medication Recommendations:   Continue: Aripiprazole  5 mg daily/mood  Consider electrolyte, including K+, correction.    ## Medical Decision Making Capacity: Not specifically addressed in this encounter  ## Further Work-up:  EKG, TOC consult for substance abuse resources, U/A, or UDS -- most recent EKG on 01/06/2024 had QtC of . -- Pertinent labwork reviewed earlier this admission includes: K= 3.3.    ## Disposition:-- There are no psychiatric contraindications to discharge at this time  ## Behavioral / Environmental: -Utilize compassion and acknowledge the patient's experiences while setting clear and realistic expectations for care.    ## Safety and Observation Level:  - Based on my clinical evaluation, I estimate the patient to be at low risk of self harm in the current setting. - At this time, we recommend routine. This decision is based on my review of the chart including patient's history and current presentation, interview of the patient, mental status examination, and consideration of suicide risk including evaluating suicidal ideation, plan, intent, suicidal or self-harm behaviors, risk factors, and protective factors. This judgment is based on our ability to directly address suicide risk, implement suicide prevention strategies, and develop a safety plan while the patient is in the clinical setting. Please contact our team if there is a concern that risk level has changed.  CSSR Risk Category:C-SSRS RISK CATEGORY: No Risk  Suicide Risk Assessment: Patient has following modifiable risk factors for suicide: recent psychiatric hospitalization, which we are addressing by follow-up with outpatient psychiatry for medication management and individual counseling. Patient has following non-modifiable or demographic risk factors for suicide: male gender, history of suicide attempt, and psychiatric hospitalization Patient has the following  protective factors against suicide: Access to outpatient mental health  care, Supportive family, Frustration tolerance, and no history of NSSIB  Thank you for this consult request. Recommendations have been communicated to the primary team.   Ellouise LITTIE Dawn, FNP       History of Present Illness  Relevant Aspects of Hospital ED Course:  Admitted on 01/06/2024 for suicidal ideation.    Patient Report:  Riggs Dineen 48 year old male is assessed by this provider face-to-face.  He is reclined on hospital stretcher upon approach, appears asleep.  He is easily awakened.  Patient is alert and oriented, pleasant and cooperative during assessment. Patient denies suicidal and homicidal ideations, he contracts verbally for safety at this time.  He denies auditory and visual hallucinations.  There is no evidence of delusional thought content and no indication that patient is responding to internal stimuli.  Patient is vague surrounding substance use.  Patient reports using alcohol and methamphetamine an average of 3 times per week only if I can afford it.  Does not recall most recent use of alcohol, approximately 24 hours ago.  Does not recall most recent use of stimulant.  Patient is not linked with outpatient psychiatry currently.  Reviewed plan to follow-up with [facility] behavioral health outpatient, patient verbalizes understanding and agreement with plan.  Patient endorses history of multiple previous inpatient psychiatric hospitalizations.  Reviewed substance use treatment including residential options at length.  Patient states I am willing to consider residential substance use treatment but not right now.  Patient reports he and his wife are currently residing under a bridge in [place] with plan to moved to [facility] this week.  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  Patient offered support and encouragement.  He verbalizes understanding of community resources and strict return precautions.    Patient educated and verbalizes understanding of mental health resources and other crisis services in the community. They are instructed to call 911 and present to the nearest emergency room should patient experience any suicidal/homicidal ideation, auditory/visual/hallucinations, or detrimental worsening of mental health condition.     Psych ROS:  Depression: continues Anxiety:  denies Mania (lifetime and current): History Psychosis: (lifetime and current): History  Collateral information:  Patient declines any person to contact currently, my wife is getting a phone this week  Review of Systems  Constitutional: Negative.   HENT: Negative.    Eyes: Negative.   Respiratory: Negative.    Cardiovascular: Negative.   Gastrointestinal: Negative.   Genitourinary: Negative.   Musculoskeletal: Negative.   Skin: Negative.   Neurological: Negative.   Psychiatric/Behavioral:  Positive for substance abuse.      Psychiatric and Social History  Psychiatric History:  Information collected from patient and chart review.   Prev Dx/Sx: Suicidal ideation Current Psych Provider: Denies Home Meds (current): Aripiprazole  5mg  daily Previous Med Trials: Haloperidol , hydroxyzine , buspar , gabapentin , zoloft  Therapy: No outpatient individual counseling services currently  Prior Psych Hospitalization:  12/26/2023 [facility], 07/14/2023 [facility], 06/30/2023 [facility], 06/25/2023 [facility], 06/16/2023 [facility], 04/05/2023 [facility] Prior Self Harm: positive Prior Violence: none reported  Family Psych History: none reported Family Hx suicide: none reported  Social History:  Occupational Hx: currently Loss adjuster, chartered Hx: None reported Living Situation: Currently homeless Spiritual Hx: none reported Access to weapons/lethal means: Denies  Substance History Alcohol: Use 2-3x per week Last Drink approx. 24 hours ago Number of drinks per day varies widely, determined by finances. Illicit drugs: Methamphetamine use 2-3  times per  week Prescription drug abuse: None reported Rehab hx: Reports previously at [facility], [facility]  Exam Findings  Physical Exam:  Vital Signs:  Temp:  [98.2 F (36.8 C)-98.8 F (37.1 C)] 98.5 F (36.9 C) (10/19 1131) Pulse Rate:  [84-110] 84 (10/19 1131) Resp:  [16-18] 18 (10/19 1131) BP: (123-143)/(76-94) 123/76 (10/19 1131) SpO2:  [98 %-100 %] 98 % (10/19 1131) Blood pressure 123/76, pulse 84, temperature 98.5 F (36.9 C), temperature source Oral, resp. rate 18, SpO2 98%. There is no height or weight on file to calculate BMI.  Physical Exam Vitals and nursing note reviewed.  Constitutional:      Appearance: Normal appearance. He is normal weight.  HENT:     Head: Normocephalic and atraumatic.     Nose: Nose normal.  Cardiovascular:     Rate and Rhythm: Normal rate.  Pulmonary:     Effort: Pulmonary effort is normal.  Musculoskeletal:        General: Normal range of motion.     Cervical back: Normal range of motion.  Skin:    General: Skin is warm and dry.  Neurological:     Mental Status: He is alert and oriented to person, place, and time.  Psychiatric:        Attention and Perception: Attention and perception normal.        Mood and Affect: Mood and affect normal.        Speech: Speech normal.        Behavior: Behavior normal. Behavior is cooperative.        Thought Content: Thought content normal.        Cognition and Memory: Cognition and memory normal.        Judgment: Judgment normal.     Mental Status Exam: General Appearance: Casual  Orientation:  Full (Time, Place, and Person)  Memory:  Immediate;   Good Recent;   Good Remote;   Good  Concentration:  Concentration: Good and Attention Span: Good  Recall:  Good  Attention  Good  Eye Contact:  Good  Speech:  Normal Rate  Language:  Good  Volume:  Decreased  Mood: Euthymic  Affect:  Appropriate and Congruent  Thought Process:  Coherent, Goal Directed, and Linear  Thought Content:  WDL and  Logical  Suicidal Thoughts:  No  Homicidal Thoughts:  No  Judgement:  Good  Insight:  Fair  Psychomotor Activity:  Normal  Akathisia:  No  Fund of Knowledge:  Good      Assets:  Communication Skills Desire for Improvement Financial Resources/Insurance Leisure Time Physical Health Resilience Social Support  Cognition:  WNL  ADL's:  Intact  AIMS (if indicated):        Other History   These have been pulled in through the EMR, reviewed, and updated if appropriate.  Family History:  The patient's family history includes Cancer in his mother; Diabetes in his father and maternal grandmother; Heart disease in his father and mother; Lung cancer in his mother; Stroke in his paternal grandfather.  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  Medications:   Current Facility-Administered Medications:    ARIPiprazole  (ABILIFY ) tablet 5 mg, 5 mg, Oral, Daily, Rancour, Stephen, MD, 5 mg at 01/06/24 1046   folic acid  (FOLVITE ) tablet 1 mg, 1 mg, Oral, Daily, Rancour, Stephen, MD, 1 mg at 01/06/24 1046   LORazepam  (ATIVAN ) tablet 1-4 mg, 1-4 mg, Oral, Q1H PRN **OR** LORazepam  (ATIVAN ) injection 1-4 mg, 1-4 mg, Intravenous, Q1H PRN, Rancour, Stephen, MD   LORazepam  (ATIVAN ) tablet 0-4 mg, 0-4 mg, Oral, Q6H, 1 mg at 01/06/24 0607 **FOLLOWED BY** [START ON 01/08/2024] LORazepam  (ATIVAN ) tablet 0-4 mg, 0-4 mg, Oral, Q12H, Rancour, Stephen, MD   multivitamin with minerals tablet 1 tablet, 1 tablet, Oral, Daily, Rancour, Stephen, MD, 1 tablet at 01/06/24 1047   thiamine  (VITAMIN B1) tablet 100 mg, 100 mg, Oral, Daily, 100 mg at 01/06/24 1046 **OR**  thiamine  (VITAMIN B1) injection 100 mg, 100 mg, Intravenous, Daily, Rancour, Stephen, MD  Current Outpatient Medications:    ARIPiprazole  (ABILIFY ) 5 MG tablet, Take 1 tablet (5 mg total) by mouth daily., Disp: 15 tablet, Rfl: 0  Allergies: Allergies  Allergen Reactions   Penicillins Other (See Comments) and Swelling    Reaction occurred in childhood    Ellouise LITTIE Dawn, FNP

## 2024-01-06 NOTE — ED Provider Notes (Addendum)
  [place] EMERGENCY DEPARTMENT AT [facility] Emergency Medicine Observation Re-evaluation Note  ISSAAC [name] is a 48 y.o. male, seen on rounds today.  Pt initially presented on 01/06/24 at 0107 to the ED for complaints of  Chief Complaint  Patient presents with   Psychiatric Evaluation  Presented to the emergency department for suicidal ideation with plan, is currently under voluntary status. PMHx: schizophrenia, polysubstance abuse, depression, anxiety  Currently, the patient is resting on stretcher, speaking with psychiatry team.  Physical Exam  BP 123/76 (BP Location: Left Arm)   Pulse 84   Temp 98.5 F (36.9 C) (Oral)   Resp 18   SpO2 98%  Physical Exam General: NAD Lungs: Normal effort Psych: Currently calm  ED Course / MDM  EKG:EKG Interpretation Date/Time:  Sunday January 06 2024 11:23:57 EDT Ventricular Rate:  86 PR Interval:  156 QRS Duration:  96 QT Interval:  402 QTC Calculation: 481 R Axis:   88  Text Interpretation: Sinus rhythm with marked sinus arrhythmia with Premature atrial complexes with Abberant conduction Prolonged QT similar to prior Abnormal ECG When compared with ECG of 06-Sep-2023 16:19, PREVIOUS ECG IS PRESENT Confirmed by Rogelia Satterfield ([id number]) on 01/06/2024 12:22:36 PM  I have reviewed the labs performed to date as well as medications administered while in observation.  Recent changes in the last 24 hours include EKG with mildly prolonged QTc, similar to prior, started on magnesium  tablets.  Pending psychiatry recommendations Home medications: Ordered by prior team Diet: By prior team  Plan  Current plan is for follow-up psychiatry recommendations.  Psychiatry evaluated patient, and feel that he is stable for discharge with outpatient follow-up.  I evaluated the patient, patient also denies SI, HI on my exam, is linear, goal-directed.  Patient's labs did demonstrate mild hypokalemia which was repleted orally.  Patient discharged  with list of outpatient resources.  Rogelia Satterfield RAMAN, MD 01/06/24 1225    Rogelia Satterfield RAMAN, MD 01/13/24 2030

## 2024-01-06 NOTE — ED Notes (Signed)
 Pt is almost incomprehensible.pt can follow commands and answer yes or no questions

## 2024-01-07 ENCOUNTER — Encounter (HOSPITAL_COMMUNITY): Payer: Self-pay

## 2024-01-07 ENCOUNTER — Emergency Department (HOSPITAL_COMMUNITY)
Admission: EM | Admit: 2024-01-07 | Discharge: 2024-01-07 | Discharge status: Against Medical Advice | Payer: MEDICAID | Attending: Emergency Medicine | Admitting: Emergency Medicine

## 2024-01-07 ENCOUNTER — Encounter (HOSPITAL_COMMUNITY): Payer: Self-pay | Admitting: Emergency Medicine

## 2024-01-07 DIAGNOSIS — F22 Delusional disorders: Secondary | ICD-10-CM | POA: Diagnosis present

## 2024-01-07 DIAGNOSIS — F1590 Other stimulant use, unspecified, uncomplicated: Secondary | ICD-10-CM | POA: Diagnosis not present

## 2024-01-07 DIAGNOSIS — Z5329 Procedure and treatment not carried out because of patient's decision for other reasons: Secondary | ICD-10-CM | POA: Diagnosis not present

## 2024-01-07 DIAGNOSIS — R45851 Suicidal ideations: Secondary | ICD-10-CM | POA: Diagnosis not present

## 2024-01-07 LAB — URINE DRUG SCREEN
Amphetamines: POSITIVE — AB
Barbiturates: NEGATIVE
Benzodiazepines: NEGATIVE
Cocaine: POSITIVE — AB
Fentanyl: NEGATIVE
Methadone Scn, Ur: NEGATIVE
Opiates: NEGATIVE
Tetrahydrocannabinol: NEGATIVE

## 2024-01-07 LAB — CBC WITH DIFFERENTIAL/PLATELET
Abs Immature Granulocytes: 0.04 K/uL (ref 0.00–0.07)
Basophils Absolute: 0 K/uL (ref 0.0–0.1)
Basophils Relative: 0 %
Eosinophils Absolute: 0.2 K/uL (ref 0.0–0.5)
Eosinophils Relative: 2 %
HCT: 39.7 % (ref 39.0–52.0)
Hemoglobin: 13 g/dL (ref 13.0–17.0)
Immature Granulocytes: 0 %
Lymphocytes Relative: 17 %
Lymphs Abs: 1.6 K/uL (ref 0.7–4.0)
MCH: 30.2 pg (ref 26.0–34.0)
MCHC: 32.7 g/dL (ref 30.0–36.0)
MCV: 92.3 fL (ref 80.0–100.0)
Monocytes Absolute: 1 K/uL (ref 0.1–1.0)
Monocytes Relative: 11 %
Neutro Abs: 6.5 K/uL (ref 1.7–7.7)
Neutrophils Relative %: 70 %
Platelets: 385 K/uL (ref 150–400)
RBC: 4.3 MIL/uL (ref 4.22–5.81)
RDW: 13.2 % (ref 11.5–15.5)
WBC: 9.5 K/uL (ref 4.0–10.5)
nRBC: 0 % (ref 0.0–0.2)

## 2024-01-07 LAB — ETHANOL: Alcohol, Ethyl (B): <15 mg/dL (ref <15)

## 2024-01-07 LAB — COMPREHENSIVE METABOLIC PANEL WITH GFR
ALT: 131 U/L — ABNORMAL HIGH (ref 0–44)
AST: 106 U/L — ABNORMAL HIGH (ref 15–41)
Albumin: 3.9 g/dL (ref 3.5–5.0)
Alkaline Phosphatase: 78 U/L (ref 38–126)
Anion gap: 11 (ref 5–15)
BUN: 6 mg/dL (ref 6–20)
CO2: 24 mmol/L (ref 22–32)
Calcium: 9.2 mg/dL (ref 8.9–10.3)
Chloride: 98 mmol/L (ref 98–111)
Creatinine, Ser: 0.8 mg/dL (ref 0.61–1.24)
GFR, Estimated: >60 mL/min (ref >60)
Glucose, Bld: 106 mg/dL — ABNORMAL HIGH (ref 70–99)
Potassium: 3.6 mmol/L (ref 3.5–5.1)
Sodium: 133 mmol/L — ABNORMAL LOW (ref 135–145)
Total Bilirubin: 0.8 mg/dL (ref 0.0–1.2)
Total Protein: 7.9 g/dL (ref 6.5–8.1)

## 2024-01-07 LAB — GC/CHLAMYDIA PROBE AMP (~~LOC~~) NOT AT ARMC
Chlamydia: NEGATIVE
Comment: NEGATIVE
Comment: NORMAL
Neisseria Gonorrhea: NEGATIVE

## 2024-01-07 LAB — RESP PANEL BY RT-PCR (RSV, FLU A&B, COVID)  RVPGX2
Influenza A by PCR: NEGATIVE
Influenza B by PCR: NEGATIVE
Resp Syncytial Virus by PCR: NEGATIVE
SARS Coronavirus 2 by RT PCR: NEGATIVE

## 2024-01-07 LAB — GROUP A STREP BY PCR: Group A Strep by PCR: NOT DETECTED

## 2024-01-07 MED ORDER — HALOPERIDOL LACTATE 5 MG/ML IJ SOLN: 2.0000 mg | Freq: Once | INTRAMUSCULAR | Status: DC

## 2024-01-07 MED ORDER — HALOPERIDOL LACTATE 5 MG/ML IJ SOLN: 5.0000 mg | Freq: Once | INTRAMUSCULAR | Status: DC

## 2024-01-07 MED ORDER — ARIPIPRAZOLE 5 MG PO TABS
5.0000 mg | ORAL_TABLET | Freq: Once | ORAL | Status: AC
  Administered 2024-01-07: 5 mg via ORAL
  Filled 2024-01-07: qty 1

## 2024-01-07 NOTE — ED Provider Notes (Addendum)
  Physical Exam  BP (!) 146/93 (BP Location: Left Arm)   Pulse 94   Temp 99.2 F (37.3 C) (Oral)   Resp 18   SpO2 97%   Physical Exam Vitals and nursing note reviewed.  Constitutional:      General: He is not in acute distress.    Appearance: Normal appearance. He is not ill-appearing, toxic-appearing or diaphoretic.  HENT:     Head: Normocephalic and atraumatic.  Eyes:     General:        Right eye: No discharge.        Left eye: No discharge.     Extraocular Movements: Extraocular movements intact.     Conjunctiva/sclera: Conjunctivae normal.  Cardiovascular:     Rate and Rhythm: Normal rate and regular rhythm.  Pulmonary:     Effort: Pulmonary effort is normal. No respiratory distress.  Abdominal:     General: Abdomen is flat. There is no distension.  Musculoskeletal:     Cervical back: Normal range of motion. No rigidity.  Skin:    General: Skin is warm and dry.  Neurological:     Mental Status: He is alert and oriented to person, place, and time. Mental status is at baseline.     Procedures  Procedures  ED Course / MDM   Clinical Course as of 01/07/24 0636  Mon Jan 07, 2024  0629 Endorses alcohol and methamphetamine. TTS consult. Hx of denying suicidal ideations in the A.M. during evaluation of SI. Today appears paranoid and endorses SI. No IVC. [CB]    Clinical Course User Index [CB] Beola Terrall RAMAN, PA-C   Medical Decision Making Amount and/or Complexity of Data Reviewed Labs: ordered.  Risk Prescription drug management.   See previous provider note.  Patient care transferred over from [facility] [place].  At time of handoff, awaiting labs and TTS consult.  Patient here voluntarily endorsing SI and demonstrating paranoia after endorsing both alcohol and methamphetamine use.  IVC not done as patient has history of SI in the evenings and denying when evaluated by TTS in the AM, with no plan in place.  Evaluated 4 times in the last week for similar.   Provided Abilify .  Labs at baseline, alcohol unremarkable, pending UDS but does not change disposition or treatment at this time with patient already endorsing methamphetamine use.   Patient is medically cleared at this time for psych evaluation.  Disposition pending TTS consultation/evaluation.  No IVC done at this time, with no hallucinations, plan or HI.  Pending TTS evaluation, patient said that he was concerned police were coming to get him due to his drug use and eloped before reevaluation to be done by provider.     Beola Terrall RAMAN, PA-C 01/07/24 0702    Beola Terrall RAMAN, PA-C 01/07/24 9276    Tegeler, Lonni PARAS, MD 01/07/24 0857    Beola Terrall RAMAN, PA-C 01/07/24 9070    Tegeler, Lonni PARAS, MD 01/07/24 [phone]

## 2024-01-07 NOTE — ED Notes (Signed)
 [facility] went to speak with pt about contacts for collateral information, resources available to him, and working with the Peer Support Specialist [employer] Per pts nurse pt decided to leave and walked out.   Chesley Holt, [facility]  01/07/24

## 2024-01-07 NOTE — ED Provider Notes (Signed)
 [place] EMERGENCY DEPARTMENT AT [facility] Provider Note   CSN: [id number] Arrival date & time: 01/07/24  9493     Patient presents with: Paranoid   Jerry [name] is a 48 y.o. male.  Patient with past medical history significant for paranoid schizophrenia, bipolar 1 disorder, alcohol use disorder, cocaine use disorder, substance-induced mood disorder presents to the emergency department voluntarily asking to stay here as he is concerned that people are coming to kill him.  He came last night with similar complaints and also on the 17th of this month.  This is his sixth visit to one of our emergency departments since October 17.  He was seen earlier this evening at our sister facility for sore throat.  He endorses suicidal ideation with plan to walk into traffic.  He denies homicidal ideation, hallucinations.  He tells me he drank some alcohol earlier today but denies any illicit substance use. Upon further discussion the patient endorses using a small amount of methamphetamine.  He has been seen by psychiatry and cleared twice over the past few days for similar symptoms but during his psychiatric evaluations he has denied suicidal ideations.   HPI     Prior to Admission medications   Medication Sig Start Date End Date Taking? Authorizing Provider  ARIPiprazole  (ABILIFY ) 5 MG tablet Take 1 tablet (5 mg total) by mouth daily. 01/05/24   Mannie Jerel PARAS, NP    Allergies: Penicillins    Review of Systems  Updated Vital Signs BP (!) 146/93 (BP Location: Left Arm)   Pulse 94   Temp 99.2 F (37.3 C) (Oral)   Resp 18   SpO2 97%   Physical Exam Vitals and nursing note reviewed.  Constitutional:      Comments: Disheveled appearance  HENT:     Head: Normocephalic and atraumatic.  Eyes:     Pupils: Pupils are equal, round, and reactive to light.  Pulmonary:     Effort: Pulmonary effort is normal. No respiratory distress.  Musculoskeletal:        General: No signs  of injury.     Cervical back: Normal range of motion.  Skin:    General: Skin is dry.  Neurological:     Mental Status: He is alert.  Psychiatric:        Speech: Speech normal.     Comments: Patient with paranoid thoughts, endorsing suicidal ideation     (all labs ordered are listed, but only abnormal results are displayed) Labs Reviewed  CBC WITH DIFFERENTIAL/PLATELET  COMPREHENSIVE METABOLIC PANEL WITH GFR  ETHANOL  URINE DRUG SCREEN    EKG: None  Radiology: No results found.   Procedures   Medications Ordered in the ED  ARIPiprazole  (ABILIFY ) tablet 5 mg (5 mg Oral Given 01/07/24 0533)                                    Medical Decision Making Amount and/or Complexity of Data Reviewed Labs: ordered.  Risk Prescription drug management.   This patient presents to the ED for concern of paranoia and suicidal ideations, this involves an extensive number of treatment options, and is a complaint that carries with it a high risk of complications and morbidity.  The differential diagnosis includes substance-induced mood disorder, paranoid schizophrenia, suicidal ideations, others   Co morbidities / Chronic conditions that complicate the patient evaluation  As noted in HPI   Additional  history obtained:  Additional history obtained from EMR External records from outside source obtained and reviewed including recent behavioral health notes clearing patient for outpatient treatment   Lab Tests:  I Ordered medical clearance labs   Problem List / ED Course / Critical interventions / Medication management   I ordered medication including Abilify  Reevaluation of the patient after these medicines showed that the patient improved I have reviewed the patients home medicines and have made adjustments as needed   Social Determinants of Health:  Patient is homeless   Test / Admission - Considered:  Patient voicing suicidal ideation along with being paranoid.   He endorses methamphetamine use and I suspect this is contributing to his presentation.  This patient does state he is suicidal and I feel that he would benefit from behavioral health evaluation again.  He has checked in with suicidal ideations multiple times this past week but during the consultant's evaluation he denies SI.  After denying SI they have felt he is clear for discharge.  Patient care being signed out to Terrall First, PA-C at shift handoff.  Once labs result patient can be medically clear for TTS evaluation.      Final diagnoses:  Suicidal ideation  Methamphetamine use  Paranoia [facility])    ED Discharge Orders     None          Logan Ubaldo KATHEE DEVONNA 01/07/24 9371    Griselda Norris, MD 01/07/24 2020

## 2024-01-07 NOTE — ED Provider Notes (Signed)
 [place] EMERGENCY DEPARTMENT AT [facility] Provider Note   CSN: [id number] Arrival date & time: 01/06/24  2353     Patient presents with: Jerry [name] is a 48 y.o. male with a past medical history significant for polysubstance abuse and significant behavioral health presents today for sore throat times several weeks.  Per triage note patient denied SI/HI, however on my examination patient is endorsing SI at this time.  Patient is evaluated by behavioral health for same earlier today and discharge.  Of note patient has now had 7 ED or [facility] visits in the last 4 days, 3 of which were evaluations for behavioral health.  Patient denies fever, chills, nausea, vomiting, chest pain, or shortness of breath.   HPI     Prior to Admission medications   Medication Sig Start Date End Date Taking? Authorizing Provider  ARIPiprazole  (ABILIFY ) 5 MG tablet Take 1 tablet (5 mg total) by mouth daily. 01/05/24   Mannie Jerel PARAS, NP    Allergies: Penicillins    Review of Systems  HENT:  Positive for sore throat.   Psychiatric/Behavioral:  Positive for suicidal ideas.     Updated Vital Signs BP 118/86 (BP Location: Right Arm)   Pulse (!) 105   Temp 98.5 F (36.9 C) (Oral)   SpO2 100%   Physical Exam Vitals and nursing note reviewed.  Constitutional:      General: He is not in acute distress.    Appearance: He is well-developed. He is toxic-appearing.  HENT:     Head: Normocephalic and atraumatic.     Jaw: No trismus.     Right Ear: External ear normal.     Left Ear: External ear normal.     Nose: Nose normal. No congestion or rhinorrhea.     Mouth/Throat:     Pharynx: Oropharynx is clear. Uvula midline. No pharyngeal swelling, oropharyngeal exudate, posterior oropharyngeal erythema or uvula swelling.     Comments: No sublingual or submandibular swelling Eyes:     Conjunctiva/sclera: Conjunctivae normal.  Cardiovascular:     Rate and Rhythm: Normal rate and  regular rhythm.     Pulses: Normal pulses.  Pulmonary:     Effort: Pulmonary effort is normal. No respiratory distress.  Abdominal:     Palpations: Abdomen is soft.     Tenderness: There is no abdominal tenderness.  Musculoskeletal:        General: No swelling.     Cervical back: Neck supple.  Skin:    General: Skin is warm and dry.     Capillary Refill: Capillary refill takes less than 2 seconds.  Neurological:     Mental Status: He is alert.  Psychiatric:        Mood and Affect: Mood normal.   Nose  (all labs ordered are listed, but only abnormal results are displayed) Labs Reviewed  GROUP A STREP BY PCR  RESP PANEL BY RT-PCR (RSV, FLU A&B, COVID)  RVPGX2    EKG: None  Radiology: No results found.   Procedures   Medications Ordered in the ED - No data to display                                  Medical Decision Making  This patient presents to the ED for concern of sore throat differential diagnosis includes COVID, flu, RSV, viral pharyngitis, strep pharyngitis, PTA, Ludwig's angina  Additional history obtained   Additional history obtained from Electronic Medical Record External records from outside source obtained and reviewed including Care Everywhere   Lab Tests:  I Ordered, and personally interpreted labs.  The pertinent results include: Strep PCR negative, respiratory panel negative   Problem List / ED Course:  Considered for admission or further workup however patient's vital signs, physical exam, and labs are reassuring. Patient has no signs or symptoms concerning for peritonsillar abscess or Ludwig's angina. Patient's symptoms likely due to viral pharyngitis. Patient advised to do salt water gargles. Patient given return precautions. I feel patient is safe for discharge at this time.    Social Determinants of Health:  Homelessness        Final diagnoses:  Hoarseness    ED Discharge Orders     None          Jerry [name] 01/07/24 0131    Jerry Meth, MD 01/07/24 [phone]

## 2024-01-07 NOTE — ED Triage Notes (Signed)
 Patient returns tonight stating people are still after him. Asking to stay here as he is concerned people are coming to kill him. Patient endorses SI.

## 2024-01-07 NOTE — Discharge Instructions (Addendum)
 Today you were seen for a sore throat.  You may gargle salt water for symptom management.  Thank you for letting us  treat you today. After reviewing your labs, I feel you are safe to go home. Please follow up with your PCP in the next several days and provide them with your records from this visit. Return to the Emergency Room if pain becomes severe or symptoms worsen.

## 2024-01-07 NOTE — ED Notes (Signed)
 Patient pacing in the hallways stating that the police are coming to get him. Patient was redirectable. However, he is refusing to change into scrubs at this time.

## 2024-01-07 NOTE — ED Notes (Signed)
 Patient eloped

## 2024-01-07 NOTE — ED Triage Notes (Signed)
 Pt here tonight from outside the police station with c/o hoarseness ( sore throat) , NO SI/HI tonight

## 2024-01-11 NOTE — ED Provider Notes (Signed)
 Emergency Department Provider Note  This document was created using the aid of voice recognition Dragon dictation software.  History   Chief Complaint  Patient presents with  . Sore Throat      Sore Throat Location:  Generalized Quality:  Aching Severity:  Mild Onset quality:  Gradual Duration:  5 days Timing:  Constant Progression:  Unchanged Chronicity:  New Associated symptoms: sinus congestion   Associated symptoms: no chest pain, no chills, no drooling, no epistaxis, no fever, no rash and no stridor     Jerry [name] is a 48 y.o. male who presents to the ED with complaints of sore throat  Physical Exam   Vitals:   01/11/24 2142 01/11/24 2245 01/11/24 2300 01/11/24 2335  BP: 131/87 128/82 (!) 122/93 131/73  BP Location: Right arm     Patient Position: Sitting     Pulse: 110 (!) 111 109 108  Resp: 18     Temp: 98.4 F (36.9 C)     TempSrc: Oral     SpO2: 96% 98% 96% 96%  Weight: 81.6 kg (179 lb 12.8 oz)     Height: 175.3 cm (5' 9)       Physical Exam Vitals and nursing note reviewed.  Constitutional:      Appearance: He is well-developed. He is not toxic-appearing or diaphoretic.  HENT:     Head: Normocephalic and atraumatic.     Right Ear: No middle ear effusion.     Left Ear:  No middle ear effusion.     Nose: Congestion and rhinorrhea present.     Mouth/Throat:     Mouth: Mucous membranes are moist.     Pharynx: Posterior oropharyngeal erythema present.  Eyes:     General: No scleral icterus. Neck:     Comments: Uvula midline No gross asymmetric palatine elevation, no obvious asymmetric swellings posterior oropharynx or neck exam, no obvious gross discomfort with range of motion of neck, floor of mouth soft, nonraised elevated, no obvious gross lesions visualize  Pulmonary:     Effort: Pulmonary effort is normal. No respiratory distress.     Breath sounds: No stridor. No wheezing.  Musculoskeletal:        General: Normal range of motion.      Cervical back: Normal range of motion and neck supple. No rigidity.  Skin:    General: Skin is warm and dry.     Capillary Refill: Capillary refill takes less than 2 seconds.  Neurological:     General: No focal deficit present.     Mental Status: He is alert and oriented to person, place, and time.  Psychiatric:        Behavior: Behavior normal.     Procedure Note  Procedures  Medical Decision Making  Differentials considered: Infection secondary to viral versus bacterial illness, pharyngitis, obstructive lung disease exacerbation, pneumonia, RPA, PTA, Ludwick's angina, Lemierre's syndrome, bronchitis  On my initial exam, the pt was calm, cooperative, conversant, follows commands appropriately, GCS 15, not tachycardic, not hypotensive, afebrile, no increased work of breathing or respiratory distress, no signs of impending respiratory failure.   Patient hooked up to cardiac telemetry for close monitoring, telemetry interpretation: Sinus rhythm, rate appropriate. Plan: ED Course as of 01/12/24 0241  Sat Jan 12, 2024  0239 ER provider interpretation of labs: COVID testing, influenza, RSV and strep testing all negative  Patient has had sinus congestion and rhinorrhea as well as pharyngitis for 5 days, given negative viral testing will start patient  on short course of oral antimicrobials to cover for bacterial etiology, otherwise he has no signs of toxicity, no drooling or stridor, no signs of sepsis, single dose steroid for symptomatic pharyngitis, otherwise patient is tolerating p.o. intake without gross difficulty [DC]    ED Course User Index [DC] Olita Duncan, MD   Based on the above findings, I believe patient is hemodynamically stable for discharge Patient/and family educated about specific return precautions for given chief complaint and symptoms.  Patient/and family educated about follow-up with PCP.  Patient/and family expressed understanding of return precautions and need for  follow-up.  Patient discharged.   ED Clinical Impression   1. Pharyngitis, unspecified etiology Active   ED Assessment/Plan  Clinical Complexity Number and complexity of problems addressed: Medical Decision Making Problems Addressed: Pharyngitis, unspecified etiology: complicated acute illness or injury that poses a threat to life or bodily functions  Amount and/or Complexity of Data Reviewed Independent Historian: friend External Data Reviewed: notes.    Details: Reviewed prior [facility] health emergency department visit where patient was seen by Dr. Jerral on January 06, 2024 Labs: ordered.  Risk Prescription drug management. Risk Details: Formal radiology imaging considered for acute work-up of patient's presentation, however based off of history and current physical exam, do not believe emergent imaging currently indicated at this time, shared decision making with patient, will reassess and order imaging as indicated.    Emergency Department Medication Summary: Medications  dexAMETHasone  (DECADRON ) tablet 10 mg (10 mg oral Given 01/11/24 2335)  cephALEXin  (KEFLEX ) capsule 500 mg (500 mg oral Given 01/11/24 2336)  ondansetron  (ZOFRAN -ODT) disintegrating tablet 4 mg (4 mg oral Given 01/11/24 2335)   ED Disposition: ED Disposition     ED Disposition  Discharge   Condition  Stable   Comment  --        This document serves as a record of services personally performed by Olita Duncan, MD. It was created on their behalf by Olita Duncan, MD, a trained medical scribe. The creation of this record is the provider's dictation and/or activities during the visit.   Electronically signed by: Olita Duncan, MD 01/12/24 2:41 AM

## 2024-01-11 NOTE — ED Triage Notes (Signed)
 Sore throat and body aches.

## 2024-01-12 NOTE — ED Triage Notes (Signed)
 Returned after his recent discharge requesting detox from alcohol.

## 2024-01-13 ENCOUNTER — Emergency Department (HOSPITAL_COMMUNITY)
Admission: EM | Admit: 2024-01-13 | Discharge: 2024-01-14 | Disposition: A | Discharge status: Other Acute Inpatient Hospital | Payer: MEDICAID | Attending: Emergency Medicine | Admitting: Emergency Medicine

## 2024-01-13 ENCOUNTER — Encounter (HOSPITAL_COMMUNITY): Payer: Self-pay

## 2024-01-13 ENCOUNTER — Other Ambulatory Visit: Payer: Self-pay

## 2024-01-13 DIAGNOSIS — R490 Dysphonia: Secondary | ICD-10-CM | POA: Diagnosis not present

## 2024-01-13 DIAGNOSIS — R45851 Suicidal ideations: Secondary | ICD-10-CM | POA: Insufficient documentation

## 2024-01-13 DIAGNOSIS — J029 Acute pharyngitis, unspecified: Secondary | ICD-10-CM | POA: Diagnosis not present

## 2024-01-13 DIAGNOSIS — Z5900 Homelessness unspecified: Secondary | ICD-10-CM | POA: Insufficient documentation

## 2024-01-13 DIAGNOSIS — F1990 Other psychoactive substance use, unspecified, uncomplicated: Secondary | ICD-10-CM | POA: Insufficient documentation

## 2024-01-13 DIAGNOSIS — F332 Major depressive disorder, recurrent severe without psychotic features: Secondary | ICD-10-CM | POA: Diagnosis present

## 2024-01-13 DIAGNOSIS — F1090 Alcohol use, unspecified, uncomplicated: Secondary | ICD-10-CM | POA: Diagnosis not present

## 2024-01-13 DIAGNOSIS — Y900 Blood alcohol level of less than 20 mg/100 ml: Secondary | ICD-10-CM | POA: Insufficient documentation

## 2024-01-13 DIAGNOSIS — F1590 Other stimulant use, unspecified, uncomplicated: Secondary | ICD-10-CM | POA: Diagnosis not present

## 2024-01-13 LAB — COMPREHENSIVE METABOLIC PANEL WITH GFR
ALT: 83 U/L — ABNORMAL HIGH (ref 0–44)
AST: 52 U/L — ABNORMAL HIGH (ref 15–41)
Albumin: 3.3 g/dL — ABNORMAL LOW (ref 3.5–5.0)
Alkaline Phosphatase: 55 U/L (ref 38–126)
Anion gap: 14 (ref 5–15)
BUN: 9 mg/dL (ref 6–20)
CO2: 23 mmol/L (ref 22–32)
Calcium: 8.6 mg/dL — ABNORMAL LOW (ref 8.9–10.3)
Chloride: 100 mmol/L (ref 98–111)
Creatinine, Ser: 0.8 mg/dL (ref 0.61–1.24)
GFR, Estimated: >60 mL/min (ref >60)
Glucose, Bld: 90 mg/dL (ref 70–99)
Potassium: 3.4 mmol/L — ABNORMAL LOW (ref 3.5–5.1)
Sodium: 137 mmol/L (ref 135–145)
Total Bilirubin: 0.7 mg/dL (ref 0.0–1.2)
Total Protein: 7.8 g/dL (ref 6.5–8.1)

## 2024-01-13 LAB — CBC
HCT: 38 % — ABNORMAL LOW (ref 39.0–52.0)
Hemoglobin: 12.7 g/dL — ABNORMAL LOW (ref 13.0–17.0)
MCH: 31.4 pg (ref 26.0–34.0)
MCHC: 33.4 g/dL (ref 30.0–36.0)
MCV: 93.8 fL (ref 80.0–100.0)
Platelets: 426 K/uL — ABNORMAL HIGH (ref 150–400)
RBC: 4.05 MIL/uL — ABNORMAL LOW (ref 4.22–5.81)
RDW: 14.1 % (ref 11.5–15.5)
WBC: 7.1 K/uL (ref 4.0–10.5)
nRBC: 0 % (ref 0.0–0.2)

## 2024-01-13 LAB — GROUP A STREP BY PCR: Group A Strep by PCR: NOT DETECTED

## 2024-01-13 LAB — RESP PANEL BY RT-PCR (RSV, FLU A&B, COVID)  RVPGX2
Influenza A by PCR: NEGATIVE
Influenza B by PCR: NEGATIVE
Resp Syncytial Virus by PCR: NEGATIVE
SARS Coronavirus 2 by RT PCR: NEGATIVE

## 2024-01-13 LAB — ETHANOL: Alcohol, Ethyl (B): <15 mg/dL (ref <15)

## 2024-01-13 NOTE — ED Triage Notes (Signed)
 Pt arrived via POV c/o suicidal ideation. Pt states that he has been suicidal x 3 weeks with a plan. Pt states that he also has laryngitis x 3 weeks.

## 2024-01-13 NOTE — ED Triage Notes (Signed)
 Pt registered with c/o sore throat x 2-3 weeks with hoarseness, pt states he has been having suicidal thoughts for the past couple of days

## 2024-01-13 NOTE — ED Provider Triage Note (Signed)
 Emergency Medicine Provider Triage Evaluation Note  Jerry [name] , a 48 y.o. male  was evaluated in triage.  Pt complains of odynophagia  with cough x 3 weeks with paranoia and SI, saying he has thoughts of wanting to jump off a bridge. Reports hearing voices that tell him they are going to shoot me in the head. Has not been taking medications. Wants to go to alcohol rehab.  Here voluntarily.  Endorses cocaine use today. Last alcohol use 1p today, 3 cans of beer.  Seen previously for similar. Currently eating a sandwich without difficulty.   Denies fever, vision changes, headache, vertigo, dysphagia, chest pain, shortness of breath, abdominal pain, n/v/d, dysuria.   Review of Systems  Positive: N/a Negative: N/a  Physical Exam  BP 115/70 (BP Location: Right Arm)   Pulse 74   Temp 98.4 F (36.9 C) (Oral)   Resp 18   Ht 5' 9 (1.753 m)   Wt 88.5 kg   SpO2 97%   BMI 28.80 kg/m  Gen:   Awake, no distress   Resp:  Normal effort  MSK:   Moves extremities without difficulty  Other:    Medical Decision Making  Medically screening exam initiated at 10:16 PM.  Appropriate orders placed.  Jerry [name] was informed that the remainder of the evaluation will be completed by another provider, this initial triage assessment does not replace that evaluation, and the importance of remaining in the ED until their evaluation is complete.     Jerry [name], Jerry [name] 01/13/24 2221

## 2024-01-13 NOTE — ED Provider Notes (Signed)
 [place] EMERGENCY DEPARTMENT AT [facility] Provider Note   CSN: [id number] Arrival date & time: 01/13/24  2006     Patient presents with: Suicidal and Sore Throat   Jerry [name] is a 48 y.o. male.  Patient with past medical history significant for substance abuse, PTSD, paranoid schizophrenia, bipolar 1 disorder, homelessness, presents to the emergency department with 2 separate complaints.  The patient states he has had a sore throat for the past 3 weeks.  He endorses hoarseness with a sore throat.  At the time of my assessment he is eating crackers and drinking soft drinks without difficulty.  The patient also endorses suicidal ideation.  He states he has felt suicidal for the past week.  He tells me he has a plan to either step in front of traffic or jump off of a bridge.  He denies homicidal ideation, hallucinations at this time.  He denies other medical  complaints including chest pain, shortness of breath, fever, abdominal pain, nausea, vomiting.    Sore Throat       Prior to Admission medications   Medication Sig Start Date End Date Taking? Authorizing Provider  Acetaminophen  (TYLENOL  PO) Take 2 tablets by mouth 2 (two) times daily as needed (pain, headache).    [provider]  ARIPiprazole  (ABILIFY ) 5 MG tablet Take 1 tablet (5 mg total) by mouth daily. 01/05/24   Mannie Jerel PARAS, NP    Allergies: Penicillins    Review of Systems  Updated Vital Signs BP 115/70 (BP Location: Right Arm)   Pulse 74   Temp 98.4 F (36.9 C) (Oral)   Resp 18   Ht 5' 9 (1.753 m)   Wt 88.5 kg   SpO2 97%   BMI 28.80 kg/m   Physical Exam Vitals and nursing note reviewed.  HENT:     Head: Normocephalic and atraumatic.     Mouth/Throat:     Mouth: Mucous membranes are moist.     Pharynx: Uvula midline. No pharyngeal swelling, oropharyngeal exudate or posterior oropharyngeal erythema.     Tonsils: No tonsillar exudate. 0 on the right. 0 on the left.  Eyes:      Conjunctiva/sclera: Conjunctivae normal.  Cardiovascular:     Rate and Rhythm: Normal rate and regular rhythm.  Pulmonary:     Effort: Pulmonary effort is normal. No respiratory distress.     Breath sounds: Normal breath sounds.  Musculoskeletal:        General: No signs of injury.     Cervical back: Normal range of motion.  Skin:    General: Skin is dry.  Neurological:     Mental Status: He is alert.  Psychiatric:        Speech: Speech normal.        Behavior: Behavior normal.     (all labs ordered are listed, but only abnormal results are displayed) Labs Reviewed  COMPREHENSIVE METABOLIC PANEL WITH GFR - Abnormal; Notable for the following components:      Result Value   Potassium 3.4 (*)    Calcium 8.6 (*)    Albumin 3.3 (*)    AST 52 (*)    ALT 83 (*)    All other components within normal limits  CBC - Abnormal; Notable for the following components:   RBC 4.05 (*)    Hemoglobin 12.7 (*)    HCT 38.0 (*)    Platelets 426 (*)    All other components within normal limits  RESP PANEL  BY RT-PCR (RSV, FLU A&B, COVID)  RVPGX2  GROUP A STREP BY PCR  ETHANOL  RAPID URINE DRUG SCREEN, HOSP PERFORMED    EKG: None  Radiology: No results found.   Procedures   Medications Ordered in the ED - No data to display                                  Medical Decision Making Amount and/or Complexity of Data Reviewed Labs: ordered.   This patient presents to the ED for concern of sore throat and suicidal ideation, this involves an extensive number of treatment options, and is a complaint that carries with it a high risk of complications and morbidity.  The differential diagnosis includes viral pharyngitis, bacterial pharyngitis, group A strep, others.  Patient also complains of suicidal ideations   Co morbidities / Chronic conditions that complicate the patient evaluation  As noted in HPI   Additional history obtained:  Additional history obtained from EMR External  records from outside source obtained and reviewed including outside emergency department notes showing patient evaluated for sore throat on the 24th.  Patient started on antimicrobials.   Lab Tests:  I Ordered, and personally interpreted labs.  The pertinent results include: Negative group A strep, negative respiratory panel   Imaging Studies ordered:  No tonsillar swelling or voice changes.  No indication for soft tissue CT at this time    Consultations Obtained:  I requested consultation with the behavioral health team   Social Determinants of Health:  Patient is homeless   Test / Admission - Considered:  Patient sore throat is likely viral in nature.  Exam is unremarkable.  He has been evaluated multiple times for the same recently with no significant findings on workup.  At the time of my assessment he was eating crackers and drinking soft drinks without any difficulty or complaints. Patient is endorsing suicidal ideation with reported plan to step in front of traffic or jump off a bridge.  He has had multiple presentations with similar complaints over the past 2 weeks.  He had at least 4 behavioral health evaluations over that time.  He would endorse suicidal ideations at night and then in the morning after resting in the hospital he would deny suicidal ideations to the behavioral health team.  He does have extensive behavioral health history.  I do not feel the patient needs IVC at this time but do feel it is reasonable to have patient evaluated by TTS.  He is cleared medically. Patient is here voluntarily       Final diagnoses:  None    ED Discharge Orders     None          Logan Ubaldo KATHEE DEVONNA 01/13/24 2356    Haze Lonni PARAS, MD 01/14/24 [phone]

## 2024-01-14 DIAGNOSIS — F1590 Other stimulant use, unspecified, uncomplicated: Secondary | ICD-10-CM

## 2024-01-14 DIAGNOSIS — F332 Major depressive disorder, recurrent severe without psychotic features: Secondary | ICD-10-CM

## 2024-01-14 DIAGNOSIS — F1090 Alcohol use, unspecified, uncomplicated: Secondary | ICD-10-CM

## 2024-01-14 LAB — RAPID URINE DRUG SCREEN, HOSP PERFORMED
Amphetamines: POSITIVE — AB
Barbiturates: NOT DETECTED
Benzodiazepines: POSITIVE — AB
Cocaine: POSITIVE — AB
Opiates: NOT DETECTED
Tetrahydrocannabinol: POSITIVE — AB

## 2024-01-14 MED ORDER — LORAZEPAM 1 MG PO TABS: 0.0000 mg | ORAL_TABLET | Freq: Two times a day (BID) | ORAL | Status: DC

## 2024-01-14 MED ORDER — THIAMINE HCL 100 MG/ML IJ SOLN
100.0000 mg | Freq: Every day | INTRAMUSCULAR | Status: DC
Start: 2024-01-14 — End: 2024-01-14

## 2024-01-14 MED ORDER — LORAZEPAM 1 MG PO TABS
0.0000 mg | ORAL_TABLET | Freq: Four times a day (QID) | ORAL | Status: DC
  Administered 2024-01-14: 2 mg via ORAL
  Filled 2024-01-14: qty 2

## 2024-01-14 MED ORDER — HYDROXYZINE HCL 50 MG PO TABS: 50.0000 mg | ORAL_TABLET | Freq: Four times a day (QID) | ORAL | Status: DC | PRN

## 2024-01-14 MED ORDER — DIAZEPAM 5 MG/ML IJ SOLN: 10.0000 mg | Freq: Four times a day (QID) | INTRAMUSCULAR | Status: DC | PRN

## 2024-01-14 MED ORDER — THIAMINE MONONITRATE 100 MG PO TABS
100.0000 mg | ORAL_TABLET | Freq: Every day | ORAL | Status: DC
  Administered 2024-01-14: 100 mg via ORAL
  Filled 2024-01-14: qty 1

## 2024-01-14 MED ORDER — ARIPIPRAZOLE 5 MG PO TABS
5.0000 mg | ORAL_TABLET | Freq: Every day | ORAL | Status: DC
  Administered 2024-01-14: 5 mg via ORAL
  Filled 2024-01-14: qty 1

## 2024-01-14 MED ORDER — ONDANSETRON 4 MG PO TBDP
4.0000 mg | ORAL_TABLET | Freq: Once | ORAL | Status: AC
  Administered 2024-01-14: 4 mg via ORAL
  Filled 2024-01-14: qty 1

## 2024-01-14 MED ORDER — LORAZEPAM 2 MG/ML IJ SOLN: 0.0000 mg | Freq: Four times a day (QID) | INTRAMUSCULAR | Status: DC

## 2024-01-14 MED ORDER — ZIPRASIDONE MESYLATE 20 MG IM SOLR: 10.0000 mg | Freq: Four times a day (QID) | INTRAMUSCULAR | Status: DC | PRN

## 2024-01-14 MED ORDER — LORAZEPAM 2 MG/ML IJ SOLN: 0.0000 mg | Freq: Two times a day (BID) | INTRAMUSCULAR | Status: DC

## 2024-01-14 NOTE — BH Assessment (Signed)
 Clinician spoke to Jerry [name] with [facility] to complete pt's TTS assessment. Clinician provided pt's name, MRN, location, age, room number and provider's name Secure message completed.    [facility] coordinator to update secure chat when assessment time and provider are assigned.   Redmond Pulling, MS, [facility], [facility] Triage Specialist [phone]
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
## 2024-01-14 NOTE — Progress Notes (Signed)
 Pt has been accepted to [facility] on 01/14/2024  Bed assignment: 2 NORTH UNIT   Pt meets inpatient criteria per: Adriana Pontes MD  Attending Physician will be: Sherwood Rummer MD  Report can be called to: [phone]  Pt can arrive after BED IS READY   Care Team Notified: Starlyn Patron NP, [facility] RN, Chesley Holt [facility]     [place] Heylee Tant LCSW-A   01/14/2024 2:33 PM

## 2024-01-14 NOTE — Consult Note (Signed)
 [facility] Consult Note  Patient Name: Jerry [name] MRN: [id number] DOB: Oct 20, 1975 DATE OF Consult: 01/14/2024  PRIMARY PSYCHIATRIC DIAGNOSES   1.  Severe Recurrent Major Depression without Psychotic Features 2.  Alcohol Use Disorder 3.  Stimulant Use Disorder   RECOMMENDATIONS  Recommendations: Medication recommendations: Continue Abilify  5 mg every day for mood control.  PRN's:  Hydroxyzine , 50 mg q6h PRN anxiety;  For severe agitation/aggression:  Geodon  10 mg IM q6h PRN and  Valium  10 mg IM q6h PRN.  In addition, patient does have history of EtOH withdrawal seizures and delirium tremens, so would recommend put him on a CIWA monitoring protocol. Non-Medication/therapeutic recommendations: Patient continues with suicidal ideation, so continue with close observation until can be admitted to psychiatric service.  Continue with matter-of-fact emotional support in ED, pending transfer Is inpatient psychiatric hospitalization recommended for this patient? Yes (Explain why): Once again, patient states he is having suicidal ideation with plan to walk into traffic, in context of chronic depression.  Meets criteria for admission, and he wishes to come in volutarily. Is another care setting recommended for this patient? (examples may include Crisis Stabilization Unit, Residential/Recovery Treatment, ALF/SNF, Memory Care Unit)  No (Explain why): As above From a psychiatric perspective, is this patient appropriate for discharge to an outpatient setting/resource or other less restrictive environment for continued care?  No (Explain why): As above Follow-Up Telepsychiatry C/L services: We will sign off for now. Please re-consult our service if needed for any concerning changes in the patient's condition, discharge planning, or questions. Communication: Treatment team members (and family members if applicable) who were involved in treatment/care discussions and planning, and with whom we spoke or  engaged with via secure text/chat, include the following: Secure message sent to Dr. Curtistine Dawn, ED attending, and staff, outlining recommendations  Thank you for involving us  in the care of this patient. If you have any additional questions or concerns, please call [phone] and ask for the provider on-call.   TELEPSYCHIATRY ATTESTATION & CONSENT  As the provider for this telehealth consult, I attest that I verified the patient's identity using two separate identifiers, introduced myself to the patient, provided my credentials, disclosed my location, and performed this encounter via a HIPAA-compliant, real-time, face-to-face, two-way, interactive audio and video platform and with the full consent and agreement of the patient (or guardian as applicable.)   Patient physical location: [facility]. Telehealth provider physical location: home office in state of [place] .  Video start time: 0710h EDT  Video end time: 0725h EDT   Total time spent in this encounter was 30 minutes, including record review, clinical interview, behavior observations, discussion of impressions and recommendations (including medications and hospitalization), and consultation/communication with relevant parties   IDENTIFYING DATA  Jerry [name] is a 48 y.o. year-old male for whom a psychiatric consultation has been ordered by the primary provider. The patient was identified using two separate identifiers.  CHIEF COMPLAINT/REASON FOR CONSULT   I can't keep living like this   HISTORY OF PRESENT ILLNESS (HPI)  The patient presents with long history major depression, some of which has been exacerbate by chronic substance use.  Now, again, comes in with suicidal ideation with plans to walk into traffic.  Patient has had multiple visits to the ED recently, usually with thoughts of suicide and helplessness.  At times he has been admitted.  At times he has calmed down once he has detoxed either from his EtOH and/or  stimulant.  Once again, the  patient presents with suicidal ideation in the context of chronic substance usage. Has very little social support (although, per Epic, attempts have been made to get him into ACT Team)  No homicidal ideation.  At this point, no psychotic symptoms or early delirium, but has had sx's in the past.  Has been drinking about a fifth of liquor daily.  UDS pending.  BAL negative.  PAST PSYCHIATRIC HISTORY  As above Otherwise as per HPI above.  PAST MEDICAL HISTORY  Past Medical History:  Diagnosis Date   Anxiety    Bipolar 1 disorder (HCC)    Depression    Drug-induced seizure (HCC)    EtOH dependence (HCC)    Hallucination    Paranoid schizophrenia (HCC)    since pt's 20's   PTSD (post-traumatic stress disorder)      HOME MEDICATIONS  PTA Medications  Medication Sig   ARIPiprazole  (ABILIFY ) 5 MG tablet Take 1 tablet (5 mg total) by mouth daily.   cephALEXin  (KEFLEX ) 500 MG capsule Take 500 mg by mouth 2 (two) times daily. For 7 days (Patient not taking: Reported on 01/14/2024)   Abilify   ALLERGIES  Allergies  Allergen Reactions   Penicillins Swelling    Childhood reaction    SOCIAL & SUBSTANCE USE HISTORY  Social History   Socioeconomic History   Marital status: Married    Spouse name: Not on file   Number of children: Not on file   Years of education: Not on file   Highest education level: Not on file  Occupational History   Not on file  Tobacco Use   Smoking status: Every Day    Current packs/day: 1.00    Types: Cigarettes   Smokeless tobacco: Never  Vaping Use   Vaping status: Never Used  Substance and Sexual Activity   Alcohol use: Yes    Comment: Decreased from 12 cans of beer and 20 shots of liquor about 2 months ago   Drug use: Yes    Types: Cocaine, Heroin, Amphetamines, Methamphetamines, Marijuana    Comment: last drug use a few days ago   Sexual activity: Yes  Other Topics Concern   Not on file  Social History Narrative   Lives  alone currently   Social Drivers of Health   Financial Resource Strain: Not on file  Food Insecurity: Food Insecurity Present (09/07/2023)   Hunger Vital Sign    Worried About Running Out of Food in the Last Year: Never true    Ran Out of Food in the Last Year: Sometimes true  Transportation Needs: Unmet Transportation Needs (09/07/2023)   PRAPARE - Administrator, Civil Service (Medical): Yes    Lack of Transportation (Non-Medical): Yes  Physical Activity: Not on file  Stress: Not on file  Social Connections: Socially Isolated (07/01/2023)   Social Connection and Isolation Panel    Frequency of Communication with Friends and Family: Never    Frequency of Social Gatherings with Friends and Family: Never    Attends Religious Services: Never    Database Administrator or Organizations: No    Attends Engineer, Structural: Never    Marital Status: Separated   Social History   Tobacco Use  Smoking Status Every Day   Current packs/day: 1.00   Types: Cigarettes  Smokeless Tobacco Never   Social History   Substance and Sexual Activity  Alcohol Use Yes   Comment: Decreased from 12 cans of beer and 20 shots of liquor about 2  months ago   Social History   Substance and Sexual Activity  Drug Use Yes   Types: Cocaine, Heroin, Amphetamines, Methamphetamines, Marijuana   Comment: last drug use a few days ago      FAMILY HISTORY  Family History  Problem Relation Age of Onset   Heart disease Mother    Cancer Mother    Lung cancer Mother    Heart disease Father    Diabetes Father    Diabetes Maternal Grandmother    Stroke Paternal Grandfather    Family Psychiatric History (if known):  Did not report  MENTAL STATUS EXAM (MSE)  Mental Status Exam: General Appearance: Disheveled  Orientation:  Full (Time, Place, and Person)  Memory:  Immediate;   Fair Recent;   Fair Remote;   Fair  Concentration:  Concentration: Poor and Attention Span: Fair  Recall:   Fair  Attention  Poor  Eye Contact:  Fair  Speech:  Garbled and edentulous  Language:  Difficult to assess, given his articulation challenges from his being edentulous  Volume:  Normal  Mood: I'm pretty odwn again  Affect:  Depressed and Flat  Thought Process:  Descriptions of Associations: Circumstantial  Thought Content:  Rumination and Tangential  Suicidal Thoughts:  Yes.  with intent/plan  Homicidal Thoughts:  No  Judgement:  Impaired  Insight:  Shallow  Psychomotor Activity:  Decreased  Akathisia:  NA  Fund of Knowledge:  Fair    Assets:  Resilience  Cognition:  WNL  ADL's:  Intact  AIMS (if indicated):       VITALS  Blood pressure (!) 123/110, pulse (!) 104, temperature 98.4 F (36.9 C), temperature source Oral, resp. rate 16, height 5' 9 (1.753 m), weight 88.5 kg, SpO2 99%.  LABS  Admission on 01/13/2024  Component Date Value Ref Range Status   Sodium 01/13/2024 137  135 - 145 mmol/L Final   Potassium 01/13/2024 3.4 (L)  3.5 - 5.1 mmol/L Final   Chloride 01/13/2024 100  98 - 111 mmol/L Final   CO2 01/13/2024 23  22 - 32 mmol/L Final   Glucose, Bld 01/13/2024 90  70 - 99 mg/dL Final   Glucose reference range applies only to samples taken after fasting for at least 8 hours.   BUN 01/13/2024 9  6 - 20 mg/dL Final   Creatinine, Ser 01/13/2024 0.80  0.61 - 1.24 mg/dL Final   Calcium 89/73/7974 8.6 (L)  8.9 - 10.3 mg/dL Final   Total Protein 89/73/7974 7.8  6.5 - 8.1 g/dL Final   Albumin 89/73/7974 3.3 (L)  3.5 - 5.0 g/dL Final   AST 89/73/7974 52 (H)  15 - 41 U/L Final   ALT 01/13/2024 83 (H)  0 - 44 U/L Final   Alkaline Phosphatase 01/13/2024 55  38 - 126 U/L Final   Total Bilirubin 01/13/2024 0.7  0.0 - 1.2 mg/dL Final   GFR, Estimated 01/13/2024 >60  >60 mL/min Final   Comment: (NOTE) Calculated using the CKD-EPI Creatinine Equation (2021)    Anion gap 01/13/2024 14  5 - 15 Final   Performed at [facility], [address] [place], [place] [address]    Alcohol, Ethyl (B) 01/13/2024 <15  <15 mg/dL Final   Comment: (NOTE) For medical purposes only. Performed at [facility], [address] [place], [place] [address]    WBC 01/13/2024 7.1  4.0 - 10.5 K/uL Final   RBC 01/13/2024 4.05 (L)  4.22 - 5.81 MIL/uL Final  Hemoglobin 01/13/2024 12.7 (L)  13.0 - 17.0 g/dL Final   HCT 89/73/7974 38.0 (L)  39.0 - 52.0 % Final   MCV 01/13/2024 93.8  80.0 - 100.0 fL Final   MCH 01/13/2024 31.4  26.0 - 34.0 pg Final   MCHC 01/13/2024 33.4  30.0 - 36.0 g/dL Final   RDW 89/73/7974 14.1  11.5 - 15.5 % Final   Platelets 01/13/2024 426 (H)  150 - 400 K/uL Final   nRBC 01/13/2024 0.0  0.0 - 0.2 % Final   Performed at [facility], [address] [place], [place] [address]   SARS Coronavirus 2 by RT PCR 01/13/2024 NEGATIVE  NEGATIVE Final   Influenza A by PCR 01/13/2024 NEGATIVE  NEGATIVE Final   Influenza B by PCR 01/13/2024 NEGATIVE  NEGATIVE Final   Comment: (NOTE) The Xpert Xpress SARS-CoV-2/FLU/RSV plus assay is intended as an aid in the diagnosis of influenza from Nasopharyngeal swab specimens and should not be used as a sole basis for treatment. Nasal washings and aspirates are unacceptable for Xpert Xpress SARS-CoV-2/FLU/RSV testing.  Fact Sheet for Patients: bloggercourse.com  Fact Sheet for Healthcare Providers: seriousbroker.it  This test is not yet approved or cleared by the [place]  FDA and has been authorized for detection and/or diagnosis of SARS-CoV-2 by FDA under an Emergency Use Authorization (EUA). This EUA will remain in effect (meaning this test can be used) for the duration of the COVID-19 declaration under Section 564(b)(1) of the Act, 21 U.S.C. section 360bbb-3(b)(1), unless the authorization is terminated or revoked.     Resp Syncytial Virus by PCR 01/13/2024 NEGATIVE  NEGATIVE Final   Comment: (NOTE) Fact Sheet for  Patients: bloggercourse.com  Fact Sheet for Healthcare Providers: seriousbroker.it  This test is not yet approved or cleared by the [place]  FDA and has been authorized for detection and/or diagnosis of SARS-CoV-2 by FDA under an Emergency Use Authorization (EUA). This EUA will remain in effect (meaning this test can be used) for the duration of the COVID-19 declaration under Section 564(b)(1) of the Act, 21 U.S.C. section 360bbb-3(b)(1), unless the authorization is terminated or revoked.  Performed at [facility], [address], [place], [place] [address]    Group A Strep by PCR 01/13/2024 NOT DETECTED  NOT DETECTED Final   Performed at [facility], [address] [place], [place] [address]    PSYCHIATRIC REVIEW OF SYSTEMS (ROS)  ROS: Notable for the following relevant positive findings: Review of Systems  Constitutional: Negative.   HENT: Negative.    Eyes: Negative.   Respiratory: Negative.    Cardiovascular: Negative.   Gastrointestinal: Negative.   Genitourinary: Negative.   Musculoskeletal: Negative.   Skin: Negative.   Neurological: Negative.   Endo/Heme/Allergies: Negative.   Psychiatric/Behavioral:  Positive for depression, substance abuse and suicidal ideas. The patient is nervous/anxious.     Additional findings:      Musculoskeletal: No abnormal movements observed      Gait & Station: Laying/Sitting      Pain Screening: Denies      Nutrition & Dental Concerns: Reviewed  RISK FORMULATION/ASSESSMENT  Is the patient experiencing any suicidal or homicidal ideations: Yes       Explain if yes:   As before, patient presents again with suicidal ideation, with plans to walk into traffic.  Protective factors considered for safety management:   Patient has little support in the community  Risk factors/concerns considered for safety management:  Prior attempt Depression Substance  abuse/dependence Physical illness/chronic pain Hopelessness Impulsivity Isolation Male gender Unmarried  Is there a safety management plan with the patient and treatment team to minimize risk factors and promote protective factors: No           Explain: AS above Is crisis care placement or psychiatric hospitalization recommended: Yes     Based on my current evaluation and risk assessment, patient is determined at this time to be at:  High risk  *RISK ASSESSMENT Risk assessment is a dynamic process; it is possible that this patient's condition, and risk level, may change. This should be re-evaluated and managed over time as appropriate. Please re-consult psychiatric consult services if additional assistance is needed in terms of risk assessment and management. If your team decides to discharge this patient, please advise the patient how to best access emergency psychiatric services, or to call 911, if their condition worsens or they feel unsafe in any way.   Adriana JINNY Pontes, MD Telepsychiatry Consult Services

## 2024-01-14 NOTE — Progress Notes (Signed)
 LCSW Progress Note  [id number]   VELDON WAGER  01/14/2024  12:53 PM  Description:   Inpatient Psychiatric Referral  Patient was recommended inpatient per Adriana Pontes (NP). There are no available beds at [facility], per [facility] Carlo RN). Patient was referred to the following out of network facilities:   Destination  Service Provider Address Phone Fax  [facility]  [address] [place], [place] [address] [phone]  [facility]  [address] [place] [address] [phone]  [facility]  [address], [place] [address] [phone]  [facility]  [address], [place] [address] [phone]      Situation ongoing, CSW to continue following and update chart as more information becomes available.      Tun[place] Lyrik Dockstader , MSW, LCSW  01/14/2024 12:53 PM

## 2024-01-14 NOTE — ED Provider Notes (Signed)
 Emergency Medicine Observation Re-evaluation Note  Jerry [name] is a 48 y.o. male, seen on rounds today.  Pt initially presented to the ED for complaints of Suicidal and Sore Throat Currently, the patient is resting comfortably. Placed on CIWA for hx of ETOH withdrawal.   Physical Exam  BP (!) 123/110 (BP Location: Right Arm)   Pulse (!) 104   Temp 98.4 F (36.9 C) (Oral)   Resp 16   Ht 5' 9 (1.753 m)   Wt 88.5 kg   SpO2 99%   BMI 28.80 kg/m  Physical Exam General: NAD Lungs: No respiratory distress Psych: Calm, cooperative  ED Course / MDM  EKG:EKG Interpretation Date/Time:  Sunday January 13 2024 22:40:11 EDT Ventricular Rate:  87 PR Interval:  140 QRS Duration:  100 QT Interval:  394 QTC Calculation: 474 R Axis:   81  Text Interpretation: Sinus rhythm with Premature atrial complexes with Abberant conduction Otherwise normal ECG When compared with ECG of 06-Jan-2024 11:23, No significant change was found Confirmed by Raford Lenis ([id number]) on 01/14/2024 3:36:43 AM  I have reviewed the labs performed to date as well as medications administered while in observation.  Recent changes in the last 24 hours include psych recommended inpatient psychiatry. Recommended CIWA protocol for hx of ETOH abuse/withdrawal  Plan  Current plan is for inpatient psychiatry     Gennaro Duwaine CROME, DO 01/14/24 [id number]

## 2024-01-14 NOTE — ED Notes (Signed)
 Pt on call with physician via TTS cart.

## 2024-01-14 NOTE — ED Notes (Signed)
 RN gave patient his two belonging bags to make sure everything was there. Patient sign the belonging sheet and a copy was  placed in the envelope. RN transported the belonging, paperwork and patient to the [facility]. The envelope was given to the safe transport drive, with the name, address of the facility, telephone number and patient label.

## 2024-02-02 ENCOUNTER — Ambulatory Visit (HOSPITAL_COMMUNITY)
Admission: EM | Admit: 2024-02-02 | Discharge: 2024-02-03 | Disposition: A | Discharge status: Other Acute Inpatient Hospital | Payer: MEDICAID | Attending: Family Medicine | Admitting: Family Medicine

## 2024-02-02 ENCOUNTER — Other Ambulatory Visit (HOSPITAL_COMMUNITY): Payer: Self-pay

## 2024-02-02 DIAGNOSIS — F1994 Other psychoactive substance use, unspecified with psychoactive substance-induced mood disorder: Secondary | ICD-10-CM | POA: Diagnosis not present

## 2024-02-02 DIAGNOSIS — R45851 Suicidal ideations: Secondary | ICD-10-CM | POA: Diagnosis not present

## 2024-02-02 DIAGNOSIS — Z91141 Patient's other noncompliance with medication regimen due to financial hardship: Secondary | ICD-10-CM | POA: Insufficient documentation

## 2024-02-02 DIAGNOSIS — Z5971 Insufficient health insurance coverage: Secondary | ICD-10-CM | POA: Insufficient documentation

## 2024-02-02 LAB — URINALYSIS, ROUTINE W REFLEX MICROSCOPIC
Bilirubin Urine: NEGATIVE
Glucose, UA: NEGATIVE mg/dL
Hgb urine dipstick: NEGATIVE
Ketones, ur: NEGATIVE mg/dL
Leukocytes,Ua: NEGATIVE
Nitrite: NEGATIVE
Protein, ur: 100 mg/dL — AB
Specific Gravity, Urine: 1.024 (ref 1.005–1.030)
pH: 6 (ref 5.0–8.0)

## 2024-02-02 LAB — POCT URINE DRUG SCREEN - MANUAL ENTRY (I-SCREEN)
POC Amphetamine UR: POSITIVE — AB
POC Buprenorphine (BUP): NOT DETECTED
POC Cocaine UR: POSITIVE — AB
POC Marijuana UR: POSITIVE — AB
POC Methadone UR: NOT DETECTED
POC Methamphetamine UR: POSITIVE — AB
POC Morphine: NOT DETECTED
POC Oxazepam (BZO): POSITIVE — AB
POC Oxycodone UR: NOT DETECTED
POC Secobarbital (BAR): NOT DETECTED

## 2024-02-02 MED ORDER — LORAZEPAM 2 MG/ML IJ SOLN: 2.0000 mg | Freq: Three times a day (TID) | INTRAMUSCULAR | Status: DC | PRN

## 2024-02-02 MED ORDER — ACETAMINOPHEN 325 MG PO TABS: 650.0000 mg | ORAL_TABLET | Freq: Four times a day (QID) | ORAL | Status: DC | PRN

## 2024-02-02 MED ORDER — MAGNESIUM HYDROXIDE 400 MG/5ML PO SUSP: 30.0000 mL | Freq: Every day | ORAL | Status: DC | PRN

## 2024-02-02 MED ORDER — HALOPERIDOL LACTATE 5 MG/ML IJ SOLN: 5.0000 mg | Freq: Three times a day (TID) | INTRAMUSCULAR | Status: DC | PRN

## 2024-02-02 MED ORDER — HALOPERIDOL LACTATE 5 MG/ML IJ SOLN: 10.0000 mg | Freq: Three times a day (TID) | INTRAMUSCULAR | Status: DC | PRN

## 2024-02-02 MED ORDER — DIPHENHYDRAMINE HCL 50 MG PO CAPS
50.0000 mg | ORAL_CAPSULE | Freq: Three times a day (TID) | ORAL | Status: DC | PRN
  Administered 2024-02-03: 50 mg via ORAL
  Filled 2024-02-02: qty 1

## 2024-02-02 MED ORDER — DIPHENHYDRAMINE HCL 50 MG/ML IJ SOLN: 50.0000 mg | Freq: Three times a day (TID) | INTRAMUSCULAR | Status: DC | PRN

## 2024-02-02 MED ORDER — HALOPERIDOL 5 MG PO TABS
5.0000 mg | ORAL_TABLET | Freq: Three times a day (TID) | ORAL | Status: DC | PRN
  Administered 2024-02-03: 5 mg via ORAL
  Filled 2024-02-02: qty 1

## 2024-02-02 MED ORDER — OLANZAPINE 10 MG PO TBDP
10.0000 mg | ORAL_TABLET | Freq: Once | ORAL | Status: AC
  Administered 2024-02-02: 10 mg via ORAL
  Filled 2024-02-02: qty 1

## 2024-02-02 MED ORDER — ALUM & MAG HYDROXIDE-SIMETH 200-200-20 MG/5ML PO SUSP
30.0000 mL | ORAL | Status: DC | PRN
  Filled 2024-02-02: qty 30

## 2024-02-02 NOTE — ED Notes (Signed)
 Patient resting quietly in bed with eyes closed, Respirations equal and unlabored, skin warm and dry, NAD. Routine safety checks conducted according to facility protocol. Will continue to monitor for safety.

## 2024-02-02 NOTE — ED Notes (Signed)
 Patient calm but paranoid.  Patient mumbling to self and responding to internal stimuli.  Patient given a sandwich and he ate part of it.  He allowed staff to help him change into scrubs.  Initially he did not allow blood work but he then allowed us  to try.  We were unsuccessful in obtaining blood as he is dehydrated and his hand are swollen.  Patient did give urine and he was positive for amphetamines, benzos cocaine, meth and thc.  Patient initially placed on the adult side however another patient was focused on him.  For safety he ws placed in flex for now.  Patient was given zydis 10mg  PO.  He has multiple scratches on his arms and legs.  He has long dirty finger and toenails.  Patient has red skin and both hands are swollen.  Will monitor push fluids and provide safety.
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
## 2024-02-02 NOTE — Progress Notes (Signed)
   02/02/24 1428  [facility] Triage Screening (Walk-ins at [facility] only)  How Did You Hear About Us ? Legal System  What Is the Reason for Your Visit/Call Today? Jerry [name] is a 48 year old male presenting to [facility] escorted by [employer]. Per [employer], pt was hearing voices at the gas station and told the clerk that he was wanting to kill himself. Pt sttaes he has been off of his medication for 2 weeks. Pt has a hx of alcohol use, but has not been using for a few days. Pt denies substance use in the past 24 hours,HI and VH. Pt appears to be disoriented and slightly paranoid.  How Long Has This Been Causing You Problems? 1 wk - 1 month  Have You Recently Had Any Thoughts About Hurting Yourself? Yes  How long ago did you have thoughts about hurting yourself? today  Are You Planning to Commit Suicide/Harm Yourself At This time? No  Have you Recently Had Thoughts About Hurting Someone Sherral? No  Are You Planning To Harm Someone At This Time? No  Physical Abuse Denies  Verbal Abuse Denies  Sexual Abuse Denies  Exploitation of patient/patient's resources Denies  Self-Neglect Denies  Possible abuse reported to: Other (Comment)  Are you currently experiencing any auditory, visual or other hallucinations? Yes  Please explain the hallucinations you are currently experiencing: voices are telling me to kill myself  Have You Used Any Alcohol or Drugs in the Past 24 Hours? No  Do you have any current medical co-morbidities that require immediate attention? No  What Do You Feel Would Help You the Most Today? Medication(s)  If access to [facility] was not available, would you have sought care in the Emergency Department? No  Determination of Need Urgent (48 hours)  Options For Referral Medication Management;Inpatient Hospitalization  Determination of Need filed? Yes

## 2024-02-02 NOTE — ED Provider Notes (Signed)
 [facility] Continuous Assessment Admission H&P  Date: 02/02/24 Patient Name: Jerry [name] MRN: [id number] Chief Complaint: I was taking Abilify  3 weeks ago   Diagnoses:  Final diagnoses:  Substance induced mood disorder [facility])   Chart reviewed with attending psychiatrist, Dr Garvin Gaines.   HPI: Per triage, patient is a 48 year old male presenting to [facility] escorted by [employer]. Per [employer], pt was hearing voices at the gas station and told the clerk that he was wanting to kill himself. Pt sttaes he has been off of his medication for 2 weeks. Pt has a hx of alcohol use, but has not been using for a few days. Pt denies substance use in the past 24 hours,HI and VH. Pt appears to be disoriented and slightly paranoid.   Pt is seen face-to-face on the [facility] Adult treatment area. Pt is alert & oriented x 4 and engages in today's visit.Today, pt states I was taking Abilify  3 weeks ago. States he was hospitalized, he is unable to recall which hospital, and was started on Haldol . States Haldol  was sent to his pharmacy and he has not picked it up due to cost stating he is uninsured and not able to afford it. He states Haldol  was effective for targeting AH during hospitalization and that he did not find Abilify  effective. He endorses paranoia described as people in a car following me and I am afraid they are going to shoot me in the head. He also endorses auditory hallucinations of voices telling me they want to kill me. He endorses suicidal ideation with a plan to jump off a bridge triggered by the paranoia. He states last suicide attempt was four years ago I tried to walk in traffic. He states due to paranoia he had a return to use of substances. See detailed substance use below.   Chart review shows patient has been seen multiple times in the month of October for suicidal ideation and substance use. Pt was referred to [facility] on 10/17. When patient was asked about this, he states  that he did not follow through. He has very little social support and attempts have been made to establish ACT services. Pt was last seen in an ED on 10/27 and inpatient hospitalization was recommended and pt was transferred to [facility] on 01/14/2024 for inpatient treatment.   Pt will be placed on the adult observation unit overnight for continuous assessment.    Total Time spent with patient: 15 minutes  Musculoskeletal  Strength & Muscle Tone: within normal limits Gait & Station: normal Patient leans: N/A  Psychiatric Specialty Exam  Presentation General Appearance:  Fairly Groomed; Casual  Eye Contact: Fair  Speech: Normal Rate; Clear and Coherent (garbles at times)  Speech Volume: Normal  Handedness: Right   Mood and Affect  Mood: Anxious; Depressed  Affect: Congruent   Thought Process  Thought Processes: Coherent  Descriptions of Associations:Intact  Orientation:Full (Time, Place and Person)  Thought Content:Paranoid Ideation; Rumination  Diagnosis of Schizophrenia or Schizoaffective disorder in past: Yes  Duration of Psychotic Symptoms: Less than six months  Hallucinations:Hallucinations: Auditory Description of Auditory Hallucinations: voices telling me they want to kill me.  Ideas of Reference:None  Suicidal Thoughts:Suicidal Thoughts: Yes, Active SI Active Intent and/or Plan: With Plan  Homicidal Thoughts:No data recorded  Sensorium  Memory: Immediate Fair; Recent Poor  Judgment: Fair  Insight: Poor   Executive Functions  Concentration: Fair  Attention Span: Fair  Recall: Fair  Fund of Knowledge: Fair  Language: Fair   Psychomotor Activity  Psychomotor Activity:Psychomotor Activity: Normal   Assets  Assets: Manufacturing Systems Engineer; Desire for Improvement; Resilience   Sleep  Sleep:Sleep: Fair   Nutritional Assessment (For OBS and FBC admissions only) Has the patient had a weight loss or gain of  10 pounds or more in the last 3 months?: No Has the patient had a decrease in food intake/or appetite?: No Does the patient have dental problems?: No Does the patient have eating habits or behaviors that may be indicators of an eating disorder including binging or inducing vomiting?: No Has the patient recently lost weight without trying?: 0 Has the patient been eating poorly because of a decreased appetite?: 0 Malnutrition Screening Tool Score: 0    Physical Exam Vitals and nursing note reviewed.  HENT:     Head: Normocephalic.     Mouth/Throat:     Mouth: Mucous membranes are moist.  Cardiovascular:     Rate and Rhythm: Normal rate.  Pulmonary:     Effort: Pulmonary effort is normal.  Skin:    General: Skin is warm and dry.     Comments: Facial erythema; pt attributes to being out in the sun.   Neurological:     Mental Status: He is alert and oriented to person, place, and time.  Psychiatric:     Comments: See HPI    Review of Systems  Constitutional:  Negative for chills and fever.  HENT:  Negative for congestion and sore throat.   Respiratory:  Negative for cough and shortness of breath.   Gastrointestinal:  Negative for diarrhea, nausea and vomiting.  Psychiatric/Behavioral:  Positive for hallucinations, substance abuse and suicidal ideas.     Blood pressure 134/86, pulse 67, temperature 97.8 F (36.6 C), temperature source Oral, resp. rate 20, SpO2 99%. There is no height or weight on file to calculate BMI.  Past Psychiatric History: Schizophrenia,  has a past medical history of Anxiety, Bipolar 1 disorder (HCC), Depression, Drug-induced seizure (HCC), EtOH dependence (HCC), Hallucination, Paranoid schizophrenia (HCC), and PTSD (post-traumatic stress disorder).  Is the patient at risk to self? Yes  Has the patient been a risk to self in the past 6 months? Yes .    Has the patient been a risk to self within the distant past? Yes   Is the patient a risk to others? No    Has the patient been a risk to others in the past 6 months? unknown Has the patient been a risk to others within the distant past? unknown  Past Medical History:    Family History: family history includes Cancer in his mother; Diabetes in his father and maternal grandmother; Heart disease in his father and mother; Lung cancer in his mother; Stroke in his paternal grandfather.   Social History: Unemployed. Housing unstable.   Last Labs:  Admission on 01/13/2024, Discharged on 01/14/2024  Component Date Value Ref Range Status   Sodium 01/13/2024 137  135 - 145 mmol/L Final   Potassium 01/13/2024 3.4 (L)  3.5 - 5.1 mmol/L Final   Chloride 01/13/2024 100  98 - 111 mmol/L Final   CO2 01/13/2024 23  22 - 32 mmol/L Final   Glucose, Bld 01/13/2024 90  70 - 99 mg/dL Final   Glucose reference range applies only to samples taken after fasting for at least 8 hours.   BUN 01/13/2024 9  6 - 20 mg/dL Final   Creatinine, Ser 01/13/2024 0.80  0.61 - 1.24  mg/dL Final   Calcium 89/73/7974 8.6 (L)  8.9 - 10.3 mg/dL Final   Total Protein 89/73/7974 7.8  6.5 - 8.1 g/dL Final   Albumin 89/73/7974 3.3 (L)  3.5 - 5.0 g/dL Final   AST 89/73/7974 52 (H)  15 - 41 U/L Final   ALT 01/13/2024 83 (H)  0 - 44 U/L Final   Alkaline Phosphatase 01/13/2024 55  38 - 126 U/L Final   Total Bilirubin 01/13/2024 0.7  0.0 - 1.2 mg/dL Final   GFR, Estimated 01/13/2024 >60  >60 mL/min Final   Comment: (NOTE) Calculated using the CKD-EPI Creatinine Equation (2021)    Anion gap 01/13/2024 14  5 - 15 Final   Performed at [facility], [address] [place], [place] [address]   Alcohol, Ethyl (B) 01/13/2024 <15  <15 mg/dL Final   Comment: (NOTE) For medical purposes only. Performed at [facility], [address] [place], [place] [address]    WBC 01/13/2024 7.1  4.0 - 10.5 K/uL Final   RBC 01/13/2024 4.05 (L)  4.22 - 5.81 MIL/uL Final   Hemoglobin 01/13/2024 12.7 (L)  13.0 - 17.0 g/dL Final   HCT  89/73/7974 38.0 (L)  39.0 - 52.0 % Final   MCV 01/13/2024 93.8  80.0 - 100.0 fL Final   MCH 01/13/2024 31.4  26.0 - 34.0 pg Final   MCHC 01/13/2024 33.4  30.0 - 36.0 g/dL Final   RDW 89/73/7974 14.1  11.5 - 15.5 % Final   Platelets 01/13/2024 426 (H)  150 - 400 K/uL Final   nRBC 01/13/2024 0.0  0.0 - 0.2 % Final   Performed at [facility], [address], [place], [place] [address]   SARS Coronavirus 2 by RT PCR 01/13/2024 NEGATIVE  NEGATIVE Final   Influenza A by PCR 01/13/2024 NEGATIVE  NEGATIVE Final   Influenza B by PCR 01/13/2024 NEGATIVE  NEGATIVE Final   Comment: (NOTE) The Xpert Xpress SARS-CoV-2/FLU/RSV plus assay is intended as an aid in the diagnosis of influenza from Nasopharyngeal swab specimens and should not be used as a sole basis for treatment. Nasal washings and aspirates are unacceptable for Xpert Xpress SARS-CoV-2/FLU/RSV testing.  Fact Sheet for Patients: bloggercourse.com  Fact Sheet for Healthcare Providers: seriousbroker.it  This test is not yet approved or cleared by the [place]  FDA and has been authorized for detection and/or diagnosis of SARS-CoV-2 by FDA under an Emergency Use Authorization (EUA). This EUA will remain in effect (meaning this test can be used) for the duration of the COVID-19 declaration under Section 564(b)(1) of the Act, 21 U.S.C. section 360bbb-3(b)(1), unless the authorization is terminated or revoked.     Resp Syncytial Virus by PCR 01/13/2024 NEGATIVE  NEGATIVE Final   Comment: (NOTE) Fact Sheet for Patients: bloggercourse.com  Fact Sheet for Healthcare Providers: seriousbroker.it  This test is not yet approved or cleared by the [place]  FDA and has been authorized for detection and/or diagnosis of SARS-CoV-2 by FDA under an Emergency Use Authorization (EUA). This EUA will remain in effect (meaning this test  can be used) for the duration of the COVID-19 declaration under Section 564(b)(1) of the Act, 21 U.S.C. section 360bbb-3(b)(1), unless the authorization is terminated or revoked.  Performed at [facility], [address] [place], [place] [address]    Group A Strep by PCR 01/13/2024 NOT DETECTED  NOT DETECTED Final   Performed at [facility], [address] [place],  [place] [address]   Opiates 01/14/2024 NONE DETECTED  NONE DETECTED Final   Cocaine 01/14/2024 POSITIVE (A)  NONE DETECTED Final   Benzodiazepines 01/14/2024 POSITIVE (A)  NONE DETECTED Final   Amphetamines 01/14/2024 POSITIVE (A)  NONE DETECTED Final   Comment: (NOTE) Trazodone  is metabolized in vivo to several metabolites, including pharmacologically active m-CPP, which is excreted in the urine. Immunoassay screens for amphetamines and MDMA have potential cross-reactivity with these compounds and may provide false positive  results.     Tetrahydrocannabinol 01/14/2024 POSITIVE (A)  NONE DETECTED Final   Barbiturates 01/14/2024 NONE DETECTED  NONE DETECTED Final   Comment: (NOTE) DRUG SCREEN FOR MEDICAL PURPOSES ONLY.  IF CONFIRMATION IS NEEDED FOR ANY PURPOSE, NOTIFY LAB WITHIN 5 DAYS.  LOWEST DETECTABLE LIMITS FOR URINE DRUG SCREEN Drug Class                     Cutoff (ng/mL) Amphetamine and metabolites    1000 Barbiturate and metabolites    200 Benzodiazepine                 200 Opiates and metabolites        300 Cocaine and metabolites        300 THC                            50 Performed at [facility], [address], [place], [place] [address]   Admission on 01/07/2024, Discharged on 01/07/2024  Component Date Value Ref Range Status   Sodium 01/07/2024 133 (L)  135 - 145 mmol/L Final   Potassium 01/07/2024 3.6  3.5 - 5.1 mmol/L Final   Chloride 01/07/2024 98  98 - 111 mmol/L Final   CO2 01/07/2024 24  22 - 32 mmol/L Final   Glucose, Bld 01/07/2024 106 (H)  70 - 99 mg/dL Final    Glucose reference range applies only to samples taken after fasting for at least 8 hours.   BUN 01/07/2024 6  6 - 20 mg/dL Final   Creatinine, Ser 01/07/2024 0.80  0.61 - 1.24 mg/dL Final   Calcium 89/79/7974 9.2  8.9 - 10.3 mg/dL Final   Total Protein 89/79/7974 7.9  6.5 - 8.1 g/dL Final   Albumin 89/79/7974 3.9  3.5 - 5.0 g/dL Final   AST 89/79/7974 106 (H)  15 - 41 U/L Final   ALT 01/07/2024 131 (H)  0 - 44 U/L Final   Alkaline Phosphatase 01/07/2024 78  38 - 126 U/L Final   Total Bilirubin 01/07/2024 0.8  0.0 - 1.2 mg/dL Final   GFR, Estimated 01/07/2024 >60  >60 mL/min Final   Comment: (NOTE) Calculated using the CKD-EPI Creatinine Equation (2021)    Anion gap 01/07/2024 11  5 - 15 Final   Performed at [facility], [address], [place], [place] [address]   Alcohol, Ethyl (B) 01/07/2024 <15  <15 mg/dL Final   Comment: (NOTE) For medical purposes only. Performed at [facility], [address], [place], [place] [address]    Opiates 01/07/2024 NEGATIVE  NEGATIVE Final   Cocaine 01/07/2024 POSITIVE (A)  NEGATIVE Final   Benzodiazepines 01/07/2024 NEGATIVE  NEGATIVE Final   Amphetamines 01/07/2024 POSITIVE (A)  NEGATIVE Final   Tetrahydrocannabinol 01/07/2024 NEGATIVE  NEGATIVE Final   Barbiturates 01/07/2024 NEGATIVE  NEGATIVE Final   Methadone Scn, Ur 01/07/2024 NEGATIVE  NEGATIVE Final   Fentanyl 01/07/2024 NEGATIVE  NEGATIVE  Final   Comment: (NOTE) Drug screen is for Medical Purposes only. Positive results are preliminary only. If confirmation is needed, notify lab within 5 days.  Drug Class                 Cutoff (ng/mL) Amphetamine and metabolites 1000 Barbiturate and metabolites 200 Benzodiazepine              200 Opiates and metabolites     300 Cocaine and metabolites     300 THC                         50 Fentanyl                    5 Methadone                   300  Trazodone  is metabolized in vivo to several  metabolites,  including pharmacologically active m-CPP, which is excreted in the  urine.  Immunoassay screens for amphetamines and MDMA have potential  cross-reactivity with these compounds and may provide false positive  result.  Performed at [facility], [address], [place], [place] [address]    WBC 01/07/2024 9.5  4.0 - 10.5 K/uL Final   RBC 01/07/2024 4.30  4.22 - 5.81 MIL/uL Final   Hemoglobin 01/07/2024 13.0  13.0 - 17.0 g/dL Final   HCT 89/79/7974 39.7  39.0 - 52.0 % Final   MCV 01/07/2024 92.3  80.0 - 100.0 fL Final   MCH 01/07/2024 30.2  26.0 - 34.0 pg Final   MCHC 01/07/2024 32.7  30.0 - 36.0 g/dL Final   RDW 89/79/7974 13.2  11.5 - 15.5 % Final   Platelets 01/07/2024 385  150 - 400 K/uL Final   nRBC 01/07/2024 0.0  0.0 - 0.2 % Final   Neutrophils Relative % 01/07/2024 70  % Final   Neutro Abs 01/07/2024 6.5  1.7 - 7.7 K/uL Final   Lymphocytes Relative 01/07/2024 17  % Final   Lymphs Abs 01/07/2024 1.6  0.7 - 4.0 K/uL Final   Monocytes Relative 01/07/2024 11  % Final   Monocytes Absolute 01/07/2024 1.0  0.1 - 1.0 K/uL Final   Eosinophils Relative 01/07/2024 2  % Final   Eosinophils Absolute 01/07/2024 0.2  0.0 - 0.5 K/uL Final   Basophils Relative 01/07/2024 0  % Final   Basophils Absolute 01/07/2024 0.0  0.0 - 0.1 K/uL Final   Immature Granulocytes 01/07/2024 0  % Final   Abs Immature Granulocytes 01/07/2024 0.04  0.00 - 0.07 K/uL Final   Performed at [facility], [address] [place], [place] [address]  Admission on 01/06/2024, Discharged on 01/07/2024  Component Date Value Ref Range Status   Group A Strep by PCR 01/07/2024 NOT DETECTED  NOT DETECTED Final   Performed at [facility], [address] [place], [place] [address]   SARS Coronavirus 2 by RT PCR 01/07/2024 NEGATIVE  NEGATIVE Final   Influenza A by PCR 01/07/2024 NEGATIVE  NEGATIVE Final   Influenza B by PCR 01/07/2024 NEGATIVE  NEGATIVE Final    Comment: (NOTE) The Xpert Xpress SARS-CoV-2/FLU/RSV plus assay is intended as an aid in the diagnosis of influenza from Nasopharyngeal swab specimens and should not be used as a sole basis for treatment. Nasal washings and aspirates are unacceptable for Xpert Xpress SARS-CoV-2/FLU/RSV testing.  Fact Sheet for Patients: bloggercourse.com  Fact Sheet for Healthcare  Providers: seriousbroker.it  This test is not yet approved or cleared by the [place]  FDA and has been authorized for detection and/or diagnosis of SARS-CoV-2 by FDA under an Emergency Use Authorization (EUA). This EUA will remain in effect (meaning this test can be used) for the duration of the COVID-19 declaration under Section 564(b)(1) of the Act, 21 U.S.C. section 360bbb-3(b)(1), unless the authorization is terminated or revoked.     Resp Syncytial Virus by PCR 01/07/2024 NEGATIVE  NEGATIVE Final   Comment: (NOTE) Fact Sheet for Patients: bloggercourse.com  Fact Sheet for Healthcare Providers: seriousbroker.it  This test is not yet approved or cleared by the [place]  FDA and has been authorized for detection and/or diagnosis of SARS-CoV-2 by FDA under an Emergency Use Authorization (EUA). This EUA will remain in effect (meaning this test can be used) for the duration of the COVID-19 declaration under Section 564(b)(1) of the Act, 21 U.S.C. section 360bbb-3(b)(1), unless the authorization is terminated or revoked.  Performed at [facility], [address] [place], [place] [address]   Admission on 01/06/2024, Discharged on 01/06/2024  Component Date Value Ref Range Status   Sodium 01/06/2024 135  135 - 145 mmol/L Final   Potassium 01/06/2024 3.3 (L)  3.5 - 5.1 mmol/L Final   Chloride 01/06/2024 99  98 - 111 mmol/L Final   CO2 01/06/2024 22  22 - 32 mmol/L Final   Glucose, Bld 01/06/2024 104 (H)   70 - 99 mg/dL Final   Glucose reference range applies only to samples taken after fasting for at least 8 hours.   BUN 01/06/2024 10  6 - 20 mg/dL Final   Creatinine, Ser 01/06/2024 0.85  0.61 - 1.24 mg/dL Final   Calcium 89/80/7974 9.3  8.9 - 10.3 mg/dL Final   Total Protein 89/80/7974 8.5 (H)  6.5 - 8.1 g/dL Final   Albumin 89/80/7974 4.2  3.5 - 5.0 g/dL Final   AST 89/80/7974 107 (H)  15 - 41 U/L Final   ALT 01/06/2024 132 (H)  0 - 44 U/L Final   Alkaline Phosphatase 01/06/2024 77  38 - 126 U/L Final   Total Bilirubin 01/06/2024 0.7  0.0 - 1.2 mg/dL Final   GFR, Estimated 01/06/2024 >60  >60 mL/min Final   Comment: (NOTE) Calculated using the CKD-EPI Creatinine Equation (2021)    Anion gap 01/06/2024 14  5 - 15 Final   Performed at [facility], [address] [place], [place] [address]   Alcohol, Ethyl (B) 01/06/2024 <15  <15 mg/dL Final   Comment: (NOTE) For medical purposes only. Performed at [facility], [address], [place], [place] [address]    WBC 01/06/2024 7.8  4.0 - 10.5 K/uL Final   RBC 01/06/2024 4.25  4.22 - 5.81 MIL/uL Final   Hemoglobin 01/06/2024 13.0  13.0 - 17.0 g/dL Final   HCT 89/80/7974 38.8 (L)  39.0 - 52.0 % Final   MCV 01/06/2024 91.3  80.0 - 100.0 fL Final   MCH 01/06/2024 30.6  26.0 - 34.0 pg Final   MCHC 01/06/2024 33.5  30.0 - 36.0 g/dL Final   RDW 89/80/7974 13.2  11.5 - 15.5 % Final   Platelets 01/06/2024 404 (H)  150 - 400 K/uL Final   nRBC 01/06/2024 0.0  0.0 - 0.2 % Final   Performed at [facility], [address] [place], [place] [address]   Opiates 01/06/2024 NEGATIVE  NEGATIVE Final   Cocaine 01/06/2024 POSITIVE (  A)  NEGATIVE Final   Benzodiazepines 01/06/2024 POSITIVE (A)  NEGATIVE Final   Amphetamines 01/06/2024 POSITIVE (A)  NEGATIVE Final   Tetrahydrocannabinol 01/06/2024 POSITIVE (A)  NEGATIVE Final   Barbiturates 01/06/2024 NEGATIVE  NEGATIVE Final   Methadone Scn, Ur  01/06/2024 NEGATIVE  NEGATIVE Final   Fentanyl 01/06/2024 NEGATIVE  NEGATIVE Final   Comment: (NOTE) Drug screen is for Medical Purposes only. Positive results are preliminary only. If confirmation is needed, notify lab within 5 days.  Drug Class                 Cutoff (ng/mL) Amphetamine and metabolites 1000 Barbiturate and metabolites 200 Benzodiazepine              200 Opiates and metabolites     300 Cocaine and metabolites     300 THC                         50 Fentanyl                    5 Methadone                   300  Trazodone  is metabolized in vivo to several metabolites,  including pharmacologically active m-CPP, which is excreted in the  urine.  Immunoassay screens for amphetamines and MDMA have potential  cross-reactivity with these compounds and may provide false positive  result.  Performed at [facility], [address], [place], [place] [address]    Acetaminophen  (Tylenol ), Serum 01/06/2024 <10 (L)  10 - 30 ug/mL Final   Comment: (NOTE) Toxic concentrations can be more effectively related to post dose interval; > 200, > 100, and > 50 ug/mL serum concentrations correspond to toxic concentrations at 4, 8, and 12 hours post dose, respectively.  Performed at [facility], [address] [place], [place] [address]    Salicylate Lvl 01/06/2024 <7.0 (L)  7.0 - 30.0 mg/dL Final   Performed at [facility], [address], [place], [place] [address]   Color, Urine 01/06/2024 AMBER (A)  YELLOW Final   BIOCHEMICALS MAY BE AFFECTED BY COLOR   APPearance 01/06/2024 HAZY (A)  CLEAR Final   Specific Gravity, Urine 01/06/2024 1.026  1.005 - 1.030 Final   pH 01/06/2024 5.0  5.0 - 8.0 Final   Glucose, UA 01/06/2024 NEGATIVE  NEGATIVE mg/dL Final   Hgb urine dipstick 01/06/2024 SMALL (A)  NEGATIVE Final   Bilirubin Urine 01/06/2024 NEGATIVE  NEGATIVE Final   Ketones, ur 01/06/2024 5 (A)  NEGATIVE mg/dL Final    Protein, ur 89/80/7974 100 (A)  NEGATIVE mg/dL Final   Nitrite 89/80/7974 NEGATIVE  NEGATIVE Final   Leukocytes,Ua 01/06/2024 NEGATIVE  NEGATIVE Final   RBC / HPF 01/06/2024 11-20  0 - 5 RBC/hpf Final   WBC, UA 01/06/2024 11-20  0 - 5 WBC/hpf Final   Bacteria, UA 01/06/2024 RARE (A)  NONE SEEN Final   Squamous Epithelial / HPF 01/06/2024 0-5  0 - 5 /HPF Final   Mucus 01/06/2024 PRESENT   Final   Hyaline Casts, UA 01/06/2024 PRESENT   Final   Performed at [facility], [address], [place], [place] [address]  Admission on 01/04/2024, Discharged on 01/05/2024  Component Date Value Ref Range Status   RPR Ser Ql 01/04/2024 NON REACTIVE  NON REACTIVE Final   Performed at [facility], [address] [place], [place] [address]   HIV Screen 4th Generation wRfx 01/04/2024 Non Reactive  Non Reactive Final   Performed at [facility], [address], [place], [place] [address]   Neisseria Gonorrhea 01/05/2024 Negative   Final   Chlamydia 01/05/2024 Negative   Final   Comment 01/05/2024 Normal Reference Ranger Chlamydia - Negative   Final   Comment 01/05/2024 Normal Reference Range Neisseria Gonorrhea - Negative   Final  Admission on 01/04/2024, Discharged on 01/04/2024  Component Date Value Ref Range Status   Sodium 01/04/2024 133 (L)  135 - 145 mmol/L Final   Potassium 01/04/2024 3.2 (L)  3.5 - 5.1 mmol/L Final   Chloride 01/04/2024 99  98 - 111 mmol/L Final   CO2 01/04/2024 21 (L)  22 - 32 mmol/L Final   Glucose, Bld 01/04/2024 117 (H)  70 - 99 mg/dL Final   Glucose reference range applies only to samples taken after fasting for at least 8 hours.   BUN 01/04/2024 5 (L)  6 - 20 mg/dL Final   Creatinine, Ser 01/04/2024 0.94  0.61 - 1.24 mg/dL Final   Calcium 89/82/7974 9.2  8.9 - 10.3 mg/dL Final   Total Protein 89/82/7974 9.1 (H)  6.5 - 8.1 g/dL Final   Albumin 89/82/7974 3.9  3.5 - 5.0 g/dL Final   AST 89/82/7974 85 (H)  15 - 41 U/L Final   ALT 01/04/2024  103 (H)  0 - 44 U/L Final   Alkaline Phosphatase 01/04/2024 77  38 - 126 U/L Final   Total Bilirubin 01/04/2024 1.5 (H)  0.0 - 1.2 mg/dL Final   GFR, Estimated 01/04/2024 >60  >60 mL/min Final   Comment: (NOTE) Calculated using the CKD-EPI Creatinine Equation (2021)    Anion gap 01/04/2024 13  5 - 15 Final   Performed at [facility], [address], [place], [place] [address]   Alcohol, Ethyl (B) 01/04/2024 <15  <15 mg/dL Final   Comment: (NOTE) For medical purposes only. Performed at [facility], [address] [place], [place] [address]    Opiates 01/04/2024 NONE DETECTED  NONE DETECTED Final   Cocaine 01/04/2024 POSITIVE (A)  NONE DETECTED Final   Benzodiazepines 01/04/2024 NONE DETECTED  NONE DETECTED Final   Amphetamines 01/04/2024 POSITIVE (A)  NONE DETECTED Final   Comment: (NOTE) Trazodone  is metabolized in vivo to several metabolites, including pharmacologically active m-CPP, which is excreted in the urine. Immunoassay screens for amphetamines and MDMA have potential cross-reactivity with these compounds and may provide false positive  results.     Tetrahydrocannabinol 01/04/2024 NONE DETECTED  NONE DETECTED Final   Barbiturates 01/04/2024 NONE DETECTED  NONE DETECTED Final   Comment: (NOTE) DRUG SCREEN FOR MEDICAL PURPOSES ONLY.  IF CONFIRMATION IS NEEDED FOR ANY PURPOSE, NOTIFY LAB WITHIN 5 DAYS.  LOWEST DETECTABLE LIMITS FOR URINE DRUG SCREEN Drug Class                     Cutoff (ng/mL) Amphetamine and metabolites    1000 Barbiturate and metabolites    200 Benzodiazepine                 200 Opiates and metabolites        300 Cocaine and metabolites        300 THC                            50 Performed  at [facility], [address], [place], [place] [address]    WBC 01/04/2024 9.8  4.0 - 10.5 K/uL Final   RBC 01/04/2024 4.64  4.22 - 5.81 MIL/uL Final   Hemoglobin 01/04/2024 14.4  13.0 - 17.0 g/dL Final   HCT 89/82/7974 41.9  39.0 -  52.0 % Final   MCV 01/04/2024 90.3  80.0 - 100.0 fL Final   MCH 01/04/2024 31.0  26.0 - 34.0 pg Final   MCHC 01/04/2024 34.4  30.0 - 36.0 g/dL Final   RDW 89/82/7974 12.8  11.5 - 15.5 % Final   Platelets 01/04/2024 422 (H)  150 - 400 K/uL Final   nRBC 01/04/2024 0.0  0.0 - 0.2 % Final   Neutrophils Relative % 01/04/2024 55  % Final   Neutro Abs 01/04/2024 5.5  1.7 - 7.7 K/uL Final   Lymphocytes Relative 01/04/2024 25  % Final   Lymphs Abs 01/04/2024 2.5  0.7 - 4.0 K/uL Final   Monocytes Relative 01/04/2024 16  % Final   Monocytes Absolute 01/04/2024 1.6 (H)  0.1 - 1.0 K/uL Final   Eosinophils Relative 01/04/2024 3  % Final   Eosinophils Absolute 01/04/2024 0.3  0.0 - 0.5 K/uL Final   Basophils Relative 01/04/2024 1  % Final   Basophils Absolute 01/04/2024 0.1  0.0 - 0.1 K/uL Final   Immature Granulocytes 01/04/2024 0  % Final   Abs Immature Granulocytes 01/04/2024 0.03  0.00 - 0.07 K/uL Final   Performed at [facility], [address], [place], [place] [address]  Admission on 09/07/2023, Discharged on 09/11/2023  Component Date Value Ref Range Status   Sodium 09/09/2023 136  135 - 145 mmol/L Final   Potassium 09/09/2023 4.5  3.5 - 5.1 mmol/L Final   Chloride 09/09/2023 99  98 - 111 mmol/L Final   CO2 09/09/2023 28  22 - 32 mmol/L Final   Glucose, Bld 09/09/2023 84  70 - 99 mg/dL Final   Glucose reference range applies only to samples taken after fasting for at least 8 hours.   BUN 09/09/2023 11  6 - 20 mg/dL Final   Creatinine, Ser 09/09/2023 1.16  0.61 - 1.24 mg/dL Final   Calcium 93/77/7974 9.8  8.9 - 10.3 mg/dL Final   Total Protein 93/77/7974 7.9  6.5 - 8.1 g/dL Final   Albumin 93/77/7974 3.0 (L)  3.5 - 5.0 g/dL Final   AST 93/77/7974 60 (H)  15 - 41 U/L Final   ALT 09/09/2023 59 (H)  0 - 44 U/L Final   Alkaline Phosphatase 09/09/2023 56  38 - 126 U/L Final   Total Bilirubin 09/09/2023 0.6  0.0 - 1.2 mg/dL Final   GFR, Estimated 09/09/2023 >60  >60 mL/min Final    Comment: (NOTE) Calculated using the CKD-EPI Creatinine Equation (2021)    Anion gap 09/09/2023 9  5 - 15 Final   Performed at [facility], [address], [place], [place] [address]   Vitamin B-12 09/09/2023 206  180 - 914 pg/mL Final   Comment: (NOTE) This assay is not validated for testing neonatal or myeloproliferative syndrome specimens for Vitamin B12 levels. Performed at [facility], [address], [place], [place] [address]    Vitamin B6 09/09/2023 7.0  3.4 - 65.2 ug/L Final   Comment: (NOTE) This test was developed and its performance characteristics determined by [facility]. It has not been cleared or approved by the Food and Drug Administration.  Deficiency:         <3.4                             Marginal:      3.4 - 5.1                             Adequate:           >5.1 Performed At: [facility] [place] [address] [place], [place] [id number] Jennette Shorter MD Ey:[id number]   Admission on 09/06/2023, Discharged on 09/07/2023  Component Date Value Ref Range Status   POC Amphetamine UR 09/06/2023 None Detected  NONE DETECTED (Cut Off Level 1000 ng/mL) Final   POC Secobarbital (BAR) 09/06/2023 None Detected  NONE DETECTED (Cut Off Level 300 ng/mL) Final   POC Buprenorphine (BUP) 09/06/2023 None Detected  NONE DETECTED (Cut Off Level 10 ng/mL) Final   POC Oxazepam (BZO) 09/06/2023 Positive (A)  NONE DETECTED (Cut Off Level 300 ng/mL) Final   POC Cocaine UR 09/06/2023 Positive (A)  NONE DETECTED (Cut Off Level 300 ng/mL) Final   POC Methamphetamine UR 09/06/2023 None Detected  NONE DETECTED (Cut Off Level 1000 ng/mL) Final   POC Morphine 09/06/2023 None Detected  NONE DETECTED (Cut Off Level 300 ng/mL) Final   POC Methadone UR 09/06/2023 None Detected  NONE DETECTED (Cut Off Level 300 ng/mL) Final   POC Oxycodone  UR 09/06/2023 None Detected  NONE DETECTED (Cut Off Level 100 ng/mL) Final   POC Marijuana UR 09/06/2023 Positive (A)   NONE DETECTED (Cut Off Level 50 ng/mL) Final  Admission on 09/05/2023, Discharged on 09/05/2023  Component Date Value Ref Range Status   Sodium 09/05/2023 134 (L)  135 - 145 mmol/L Final   Potassium 09/05/2023 3.2 (L)  3.5 - 5.1 mmol/L Final   Chloride 09/05/2023 100  98 - 111 mmol/L Final   CO2 09/05/2023 24  22 - 32 mmol/L Final   Glucose, Bld 09/05/2023 117 (H)  70 - 99 mg/dL Final   Glucose reference range applies only to samples taken after fasting for at least 8 hours.   BUN 09/05/2023 7  6 - 20 mg/dL Final   Creatinine, Ser 09/05/2023 0.77  0.61 - 1.24 mg/dL Final   Calcium 93/81/7974 9.0  8.9 - 10.3 mg/dL Final   Total Protein 93/81/7974 8.2 (H)  6.5 - 8.1 g/dL Final   Albumin 93/81/7974 3.2 (L)  3.5 - 5.0 g/dL Final   AST 93/81/7974 79 (H)  15 - 41 U/L Final   ALT 09/05/2023 51 (H)  0 - 44 U/L Final   Alkaline Phosphatase 09/05/2023 68  38 - 126 U/L Final   Total Bilirubin 09/05/2023 1.1  0.0 - 1.2 mg/dL Final   GFR, Estimated 09/05/2023 >60  >60 mL/min Final   Comment: (NOTE) Calculated using the CKD-EPI Creatinine Equation (2021)    Anion gap 09/05/2023 10  5 - 15 Final   Performed at [facility], [address], [place], [place] [address]   WBC 09/05/2023 9.6  4.0 - 10.5 K/uL Final   RBC 09/05/2023 4.24  4.22 - 5.81 MIL/uL Final   Hemoglobin 09/05/2023 13.9  13.0 - 17.0 g/dL Final   HCT 93/81/7974 41.2  39.0 - 52.0 % Final   MCV 09/05/2023 97.2  80.0 - 100.0 fL Final   MCH 09/05/2023 32.8  26.0 - 34.0 pg Final  MCHC 09/05/2023 33.7  30.0 - 36.0 g/dL Final   RDW 93/81/7974 13.3  11.5 - 15.5 % Final   Platelets 09/05/2023 305  150 - 400 K/uL Final   nRBC 09/05/2023 0.0  0.0 - 0.2 % Final   Performed at [facility], [address] [place], [place] [address]  Admission on 09/04/2023, Discharged on 09/05/2023  Component Date Value Ref Range Status   Sodium 09/04/2023 128 (L)  135 - 145 mmol/L Final   Potassium 09/04/2023 3.0 (L)   3.5 - 5.1 mmol/L Final   Chloride 09/04/2023 97 (L)  98 - 111 mmol/L Final   CO2 09/04/2023 21 (L)  22 - 32 mmol/L Final   Glucose, Bld 09/04/2023 132 (H)  70 - 99 mg/dL Final   Glucose reference range applies only to samples taken after fasting for at least 8 hours.   BUN 09/04/2023 8  6 - 20 mg/dL Final   Creatinine, Ser 09/04/2023 0.86  0.61 - 1.24 mg/dL Final   Calcium 93/82/7974 8.1 (L)  8.9 - 10.3 mg/dL Final   Total Protein 93/82/7974 7.9  6.5 - 8.1 g/dL Final   Albumin 93/82/7974 3.2 (L)  3.5 - 5.0 g/dL Final   AST 93/82/7974 74 (H)  15 - 41 U/L Final   ALT 09/04/2023 48 (H)  0 - 44 U/L Final   Alkaline Phosphatase 09/04/2023 66  38 - 126 U/L Final   Total Bilirubin 09/04/2023 0.7  0.0 - 1.2 mg/dL Final   GFR, Estimated 09/04/2023 >60  >60 mL/min Final   Comment: (NOTE) Calculated using the CKD-EPI Creatinine Equation (2021)    Anion gap 09/04/2023 10  5 - 15 Final   Performed at [facility], [address], [place], [place] [address]   Alcohol, Ethyl (B) 09/04/2023 <15  <15 mg/dL Final   Comment: (NOTE) For medical purposes only. Performed at [facility], [address], [place], [place] [address]    Opiates 09/04/2023 NONE DETECTED  NONE DETECTED Final   Cocaine 09/04/2023 POSITIVE (A)  NONE DETECTED Final   Benzodiazepines 09/04/2023 NONE DETECTED  NONE DETECTED Final   Amphetamines 09/04/2023 NONE DETECTED  NONE DETECTED Final   Tetrahydrocannabinol 09/04/2023 NONE DETECTED  NONE DETECTED Final   Barbiturates 09/04/2023 NONE DETECTED  NONE DETECTED Final   Comment: (NOTE) DRUG SCREEN FOR MEDICAL PURPOSES ONLY.  IF CONFIRMATION IS NEEDED FOR ANY PURPOSE, NOTIFY LAB WITHIN 5 DAYS.  LOWEST DETECTABLE LIMITS FOR URINE DRUG SCREEN Drug Class                     Cutoff (ng/mL) Amphetamine and metabolites    1000 Barbiturate and metabolites    200 Benzodiazepine                 200 Opiates and metabolites        300 Cocaine and  metabolites        300 THC                            50 Performed at [facility], [address] [place], [place] [address]    WBC 09/04/2023 10.4  4.0 - 10.5 K/uL Final   RBC 09/04/2023 4.00 (L)  4.22 - 5.81 MIL/uL Final   Hemoglobin 09/04/2023 13.2  13.0 - 17.0 g/dL Final   HCT 93/82/7974 38.8 (L)  39.0 - 52.0 % Final   MCV  09/04/2023 97.0  80.0 - 100.0 fL Final   MCH 09/04/2023 33.0  26.0 - 34.0 pg Final   MCHC 09/04/2023 34.0  30.0 - 36.0 g/dL Final   RDW 93/82/7974 13.2  11.5 - 15.5 % Final   Platelets 09/04/2023 276  150 - 400 K/uL Final   nRBC 09/04/2023 0.0  0.0 - 0.2 % Final   Neutrophils Relative % 09/04/2023 83  % Final   Neutro Abs 09/04/2023 8.6 (H)  1.7 - 7.7 K/uL Final   Lymphocytes Relative 09/04/2023 7  % Final   Lymphs Abs 09/04/2023 0.7  0.7 - 4.0 K/uL Final   Monocytes Relative 09/04/2023 10  % Final   Monocytes Absolute 09/04/2023 1.1 (H)  0.1 - 1.0 K/uL Final   Eosinophils Relative 09/04/2023 0  % Final   Eosinophils Absolute 09/04/2023 0.0  0.0 - 0.5 K/uL Final   Basophils Relative 09/04/2023 0  % Final   Basophils Absolute 09/04/2023 0.0  0.0 - 0.1 K/uL Final   Immature Granulocytes 09/04/2023 0  % Final   Abs Immature Granulocytes 09/04/2023 0.03  0.00 - 0.07 K/uL Final   Performed at [facility], [address] [place], [place] [address]   Potassium 09/05/2023 3.6  3.5 - 5.1 mmol/L Final   Performed at [facility], [address] [place], [place] [address]  There may be more visits with results that are not included.    Allergies: Penicillins  Medications:  Facility Ordered Medications  Medication   acetaminophen  (TYLENOL ) tablet 650 mg   alum & mag hydroxide-simeth (MAALOX/MYLANTA) 200-200-20 MG/5ML suspension 30 mL   magnesium  hydroxide (MILK OF MAGNESIA) suspension 30 mL   haloperidol  (HALDOL ) tablet 5 mg   And   diphenhydrAMINE  (BENADRYL ) capsule 50 mg   haloperidol  lactate  (HALDOL ) injection 5 mg   And   diphenhydrAMINE  (BENADRYL ) injection 50 mg   And   LORazepam  (ATIVAN ) injection 2 mg   haloperidol  lactate (HALDOL ) injection 10 mg   And   diphenhydrAMINE  (BENADRYL ) injection 50 mg   And   LORazepam  (ATIVAN ) injection 2 mg   PTA Medications  Medication Sig   ARIPiprazole  (ABILIFY ) 5 MG tablet Take 1 tablet (5 mg total) by mouth daily.      Medical Decision Making  Lab Orders         Comprehensive metabolic panel         CBC with Differential/Platelet         Ethanol         Urinalysis, Routine w reflex microscopic -Urine, Clean Catch         POCT Urine Drug Screen - (I-Screen)     EKG    Recommendations  Based on my evaluation the patient does not appear to have an emergency medical condition.  Plan Admit to Adult observation overnight for continuous assessment to determine clinically appropriate level of care and for de-escalation of the crisis for possible avoidance of unnecessary hospitalization or higher level of care  Medication Zyprexa  zydis 10 mg PO now Agitation protocol  Sherrell Culver, PMHNP-BC, FNP-BC  02/02/24  4:12 PM

## 2024-02-02 NOTE — ED Notes (Signed)
 Medication has taken effect and patient is now asleep.  Will monitor.

## 2024-02-02 NOTE — ED Notes (Signed)
 PT ADMITTED TO [facility] UNIT WITH EXTREME DISORGANIZATION. PT COMPLETELY UNABLE TO PARTICIPATE IN ASSESSMENT; THOUGHT PROCESS HIGHLY FRAGMENTED AND NON-SEQUENTIAL. SKIN ASSESSMENT COMPLETED BY RAFE HERO., RN. UNABLE TO ASSESS SI OR PLAN DUE TO SEVERE INCOHERENCE. PT CONTINUOUSLY TALKING TO SELF, RESPONDING TO INTERNAL STIMULI THROUGHOUT ENCOUNTER. ENVIRONMENT THOROUGHLY SECURED FOR MAXIMUM SAFETY PRECAUTIONS.

## 2024-02-03 ENCOUNTER — Other Ambulatory Visit: Payer: Self-pay

## 2024-02-03 ENCOUNTER — Encounter (HOSPITAL_COMMUNITY): Payer: Self-pay | Admitting: Emergency Medicine

## 2024-02-03 ENCOUNTER — Inpatient Hospital Stay (HOSPITAL_COMMUNITY)
Admission: AD | Admit: 2024-02-03 | Discharge: 2024-02-08 | DRG: 885 | Disposition: A | Discharge status: Home / Self Care | Payer: MEDICAID | Source: Intra-hospital

## 2024-02-03 DIAGNOSIS — Z5941 Food insecurity: Secondary | ICD-10-CM

## 2024-02-03 DIAGNOSIS — Z91410 Personal history of adult physical and sexual abuse: Secondary | ICD-10-CM | POA: Diagnosis not present

## 2024-02-03 DIAGNOSIS — B1920 Unspecified viral hepatitis C without hepatic coma: Secondary | ICD-10-CM | POA: Diagnosis present

## 2024-02-03 DIAGNOSIS — F200 Paranoid schizophrenia: Principal | ICD-10-CM | POA: Diagnosis present

## 2024-02-03 DIAGNOSIS — Z801 Family history of malignant neoplasm of trachea, bronchus and lung: Secondary | ICD-10-CM

## 2024-02-03 DIAGNOSIS — F1110 Opioid abuse, uncomplicated: Secondary | ICD-10-CM | POA: Diagnosis present

## 2024-02-03 DIAGNOSIS — F1010 Alcohol abuse, uncomplicated: Secondary | ICD-10-CM | POA: Diagnosis present

## 2024-02-03 DIAGNOSIS — Z5900 Homelessness unspecified: Secondary | ICD-10-CM | POA: Diagnosis not present

## 2024-02-03 DIAGNOSIS — Z8249 Family history of ischemic heart disease and other diseases of the circulatory system: Secondary | ICD-10-CM

## 2024-02-03 DIAGNOSIS — F1994 Other psychoactive substance use, unspecified with psychoactive substance-induced mood disorder: Principal | ICD-10-CM | POA: Diagnosis present

## 2024-02-03 DIAGNOSIS — R45851 Suicidal ideations: Secondary | ICD-10-CM | POA: Diagnosis present

## 2024-02-03 DIAGNOSIS — Z5982 Transportation insecurity: Secondary | ICD-10-CM | POA: Diagnosis not present

## 2024-02-03 DIAGNOSIS — F4310 Post-traumatic stress disorder, unspecified: Secondary | ICD-10-CM | POA: Diagnosis present

## 2024-02-03 DIAGNOSIS — Z823 Family history of stroke: Secondary | ICD-10-CM | POA: Diagnosis not present

## 2024-02-03 DIAGNOSIS — F1914 Other psychoactive substance abuse with psychoactive substance-induced mood disorder: Secondary | ICD-10-CM | POA: Diagnosis present

## 2024-02-03 DIAGNOSIS — F17210 Nicotine dependence, cigarettes, uncomplicated: Secondary | ICD-10-CM | POA: Diagnosis present

## 2024-02-03 DIAGNOSIS — Z91148 Patient's other noncompliance with medication regimen for other reason: Secondary | ICD-10-CM

## 2024-02-03 DIAGNOSIS — F209 Schizophrenia, unspecified: Secondary | ICD-10-CM | POA: Diagnosis not present

## 2024-02-03 DIAGNOSIS — E559 Vitamin D deficiency, unspecified: Secondary | ICD-10-CM | POA: Diagnosis present

## 2024-02-03 DIAGNOSIS — Z880 Allergy status to penicillin: Secondary | ICD-10-CM | POA: Diagnosis not present

## 2024-02-03 DIAGNOSIS — F1910 Other psychoactive substance abuse, uncomplicated: Secondary | ICD-10-CM | POA: Diagnosis present

## 2024-02-03 DIAGNOSIS — F1310 Sedative, hypnotic or anxiolytic abuse, uncomplicated: Secondary | ICD-10-CM | POA: Diagnosis present

## 2024-02-03 DIAGNOSIS — Z833 Family history of diabetes mellitus: Secondary | ICD-10-CM | POA: Diagnosis not present

## 2024-02-03 DIAGNOSIS — F1410 Cocaine abuse, uncomplicated: Secondary | ICD-10-CM | POA: Diagnosis present

## 2024-02-03 DIAGNOSIS — F1210 Cannabis abuse, uncomplicated: Secondary | ICD-10-CM | POA: Diagnosis present

## 2024-02-03 DIAGNOSIS — Z8349 Family history of other endocrine, nutritional and metabolic diseases: Secondary | ICD-10-CM

## 2024-02-03 DIAGNOSIS — F1510 Other stimulant abuse, uncomplicated: Secondary | ICD-10-CM | POA: Diagnosis present

## 2024-02-03 LAB — COMPREHENSIVE METABOLIC PANEL WITH GFR
ALT: 87 U/L — ABNORMAL HIGH (ref 0–44)
AST: 72 U/L — ABNORMAL HIGH (ref 15–41)
Albumin: 3 g/dL — ABNORMAL LOW (ref 3.5–5.0)
Alkaline Phosphatase: 60 U/L (ref 38–126)
Anion gap: 10 (ref 5–15)
BUN: 6 mg/dL (ref 6–20)
CO2: 23 mmol/L (ref 22–32)
Calcium: 9 mg/dL (ref 8.9–10.3)
Chloride: 102 mmol/L (ref 98–111)
Creatinine, Ser: 0.73 mg/dL (ref 0.61–1.24)
GFR, Estimated: >60 mL/min (ref >60)
Glucose, Bld: 110 mg/dL — ABNORMAL HIGH (ref 70–99)
Potassium: 4 mmol/L (ref 3.5–5.1)
Sodium: 135 mmol/L (ref 135–145)
Total Bilirubin: 1 mg/dL (ref 0.0–1.2)
Total Protein: 7.5 g/dL (ref 6.5–8.1)

## 2024-02-03 LAB — CBC WITH DIFFERENTIAL/PLATELET
Abs Immature Granulocytes: 0.02 K/uL (ref 0.00–0.07)
Basophils Absolute: 0.1 K/uL (ref 0.0–0.1)
Basophils Relative: 1 %
Eosinophils Absolute: 0.3 K/uL (ref 0.0–0.5)
Eosinophils Relative: 5 %
HCT: 39.6 % (ref 39.0–52.0)
Hemoglobin: 13.2 g/dL (ref 13.0–17.0)
Immature Granulocytes: 0 %
Lymphocytes Relative: 22 %
Lymphs Abs: 1.5 K/uL (ref 0.7–4.0)
MCH: 31.2 pg (ref 26.0–34.0)
MCHC: 33.3 g/dL (ref 30.0–36.0)
MCV: 93.6 fL (ref 80.0–100.0)
Monocytes Absolute: 1.1 K/uL — ABNORMAL HIGH (ref 0.1–1.0)
Monocytes Relative: 15 %
Neutro Abs: 4.1 K/uL (ref 1.7–7.7)
Neutrophils Relative %: 57 %
Platelets: 408 K/uL — ABNORMAL HIGH (ref 150–400)
RBC: 4.23 MIL/uL (ref 4.22–5.81)
RDW: 14.4 % (ref 11.5–15.5)
WBC: 7.1 K/uL (ref 4.0–10.5)
nRBC: 0 % (ref 0.0–0.2)

## 2024-02-03 LAB — ETHANOL: Alcohol, Ethyl (B): <15 mg/dL (ref <15)

## 2024-02-03 MED ORDER — LORAZEPAM 2 MG/ML IJ SOLN: 2.0000 mg | Freq: Three times a day (TID) | INTRAMUSCULAR | Status: DC | PRN

## 2024-02-03 MED ORDER — DIPHENHYDRAMINE HCL 25 MG PO CAPS: 50.0000 mg | ORAL_CAPSULE | Freq: Three times a day (TID) | ORAL | Status: DC | PRN

## 2024-02-03 MED ORDER — MAGNESIUM HYDROXIDE 400 MG/5ML PO SUSP: 30.0000 mL | Freq: Every day | ORAL | Status: DC | PRN

## 2024-02-03 MED ORDER — HALOPERIDOL LACTATE 5 MG/ML IJ SOLN: 10.0000 mg | Freq: Three times a day (TID) | INTRAMUSCULAR | Status: DC | PRN

## 2024-02-03 MED ORDER — OLANZAPINE 10 MG PO TABS: 10.0000 mg | ORAL_TABLET | Freq: Every day | ORAL | Status: DC

## 2024-02-03 MED ORDER — DIPHENHYDRAMINE HCL 50 MG/ML IJ SOLN: 50.0000 mg | Freq: Three times a day (TID) | INTRAMUSCULAR | Status: DC | PRN

## 2024-02-03 MED ORDER — OLANZAPINE 10 MG PO TBDP
10.0000 mg | ORAL_TABLET | Freq: Once | ORAL | Status: AC
  Administered 2024-02-03: 10 mg via ORAL
  Filled 2024-02-03: qty 1

## 2024-02-03 MED ORDER — HALOPERIDOL LACTATE 5 MG/ML IJ SOLN: 5.0000 mg | Freq: Three times a day (TID) | INTRAMUSCULAR | Status: DC | PRN

## 2024-02-03 MED ORDER — ACETAMINOPHEN 325 MG PO TABS
650.0000 mg | ORAL_TABLET | Freq: Four times a day (QID) | ORAL | Status: DC | PRN
  Administered 2024-02-06: 650 mg via ORAL
  Filled 2024-02-03: qty 2

## 2024-02-03 MED ORDER — ALUM & MAG HYDROXIDE-SIMETH 200-200-20 MG/5ML PO SUSP: 30.0000 mL | ORAL | Status: DC | PRN

## 2024-02-03 MED ORDER — HALOPERIDOL 5 MG PO TABS: 5.0000 mg | ORAL_TABLET | Freq: Three times a day (TID) | ORAL | Status: DC | PRN

## 2024-02-03 MED ORDER — NICOTINE 14 MG/24HR TD PT24
14.0000 mg | MEDICATED_PATCH | Freq: Every day | TRANSDERMAL | Status: DC
  Filled 2024-02-03 (×3): qty 1

## 2024-02-03 MED ORDER — OLANZAPINE 10 MG PO TABS
10.0000 mg | ORAL_TABLET | Freq: Every day | ORAL | Status: DC
  Administered 2024-02-03: 10 mg via ORAL
  Filled 2024-02-03 (×2): qty 1

## 2024-02-03 NOTE — Discharge Instructions (Signed)
 Transfer to [facility]

## 2024-02-03 NOTE — Progress Notes (Signed)
 [facility] LCSW Progress Note   02/03/2024    11:47 AM  Jerry [name]   [id number]   Type of Contact and Topic:  Psychiatric Bed Placement   Pt accepted to [facility] 506-2    Patient meets inpatient criteria per Sherrell Culver, NP   The attending provider will be Dr. Oliva Salmon  Call report to [phone]    Cloyd Roach, RN @ [facility] notified.     Pt scheduled  to arrive at [facility] TODAY.    Bunnie Gallop, MSW, LCSW-A  11:48 AM 02/03/2024

## 2024-02-03 NOTE — ED Notes (Signed)
 Pt sleeping in bed, no acute distress noted. Respirations even and unlabored. Continue to monitor for safety.

## 2024-02-03 NOTE — ED Notes (Signed)
 Pt continues to keep behavior under control. He's been sleeping most of the shift, awake to eat and toilet and then back to sleep. He  has not mentioned the voices since taking the Zyprexa . New order for Zyprexa  10 mg po at bedtime received. Pt has been made IVC and still waiting for IVC paperwork to be served and then he will transfer to [facility] where a bed is ready.  Will continue to monitor behavior and report changes as noted.

## 2024-02-03 NOTE — ED Notes (Signed)
 VS stable, no reports of pain. Pt endorses AH. Pt denies SI/HI/VH, contracts for safety. Calm and cooperative. No acute distress reported. Continue to monitor for safety.

## 2024-02-03 NOTE — ED Notes (Addendum)
 9264 am [place] awake and pacing in Flex unit at change of shift. Paranoid on approach and saying the voices are trying to kill me, they trying to shoot me!SABRA Agent was re-oriented in a calm tone to that he is safe and no one is going to harm him. He was offered prn haldol  5mg  po and benadryl  50mg  po with acceptance. He had his labs drawn without issue. and after 1hr 30 min he was still exhibiting same symptoms and asking for something to help him. Provider informed of behavior and new order for Zyprexa  10mg  po was ordered and administered with positive effects. Pt relaxed and had a snack and then laid down in the lounger to sleep. Currently Price has been referred to [facility] for continued treatment. Will continue to monitor behavior and report changes as noted.

## 2024-02-03 NOTE — Progress Notes (Signed)
 48 year old male admitted to unit for AH, agitation, expressing medication incompliant for weeks. At time of assessment patient calm, cooperative, denies SI, HI, states Not right now when asked if he is currently hearing things.  Patient oriented to unit and safety policy and protocols. Routine skin assessment conducted with MHT, belongings checked and items that are unable to be at bedside placed in locker #9.   Patient walked independently to unit and room. Provided meal and hydration per request. No acute concerns at this time. Will continue to monitor.

## 2024-02-03 NOTE — ED Notes (Signed)
 Pt sleeping quietly long intervals through night.  Q 15 min  safety checks maintained  Will continue to promote safety

## 2024-02-03 NOTE — ED Notes (Signed)
 Report called to [place] at [facility].

## 2024-02-03 NOTE — ED Provider Notes (Addendum)
 FBC/OBS ASAP Discharge Summary  Date and Time: 02/03/2024 4:53 PM  Name: Jerry [name]  MRN:  [id number]   Discharge Diagnoses:  Final diagnoses:  Substance induced mood disorder [facility])   Chart reviewed and discussed with attending psychiatrist, Dr Garvin Gaines.  Subjective: I'm still hearing voices   Stay Summary: Pt presented to [facility] on 02/02/2024, escorted by [employer]. Per [employer], pt was hearing voices at the gas station and told the clerk that he was wanting to kill himself. Pt sttaes he has been off of his medication for 2 weeks. Pt has a hx of alcohol use, but has not been using for a few days. Pt appears to be disoriented and slightly paranoid. Stated I was taking Abilify  3 weeks ago. States he was hospitalized, he was unable to recall which hospital, and was started on Haldol . Stated Haldol  was sent to his pharmacy and he has not picked it up due to cost stating he is uninsured and not able to afford it. [facility] and [facility] pharmacy contacted and neither had history of Haldol  being filled. He endorsed paranoia described as people in a car following me and I am afraid they are going to shoot me in the head. He also endorsed auditory hallucinations of voices telling me they want to kill me. He endorsed suicidal ideation with a plan to jump off a bridge triggered by the paranoia. He stated last suicide attempt was four years ago I tried to walk in traffic. He stated due to paranoia he had a return to use of substances UDS positive for amphetamines, benzodiazepines, cocaine, methamphetamine and marijuana.  He was started on Zyprexa  zydis 10 mg to target psychosis. Pt continues to be very paranoid and verbalizes that someone is following and concerned they will come to the hospital and get him. He was placed on the flex unit due to paranoia. He slept through the night and has been tolerating PO intake. He permitted this practitioner to complete venipuncture for labs. He is exhibiting  active psychosis, including significant paranoia thinking that people are watching him (see them over there) and following him; delusions, disorganized thought processes and poor judgment. He continues to endorse auditory hallucinations of voices telling me they want to kill me. Endorses suicidal ideation triggered by paranoia. Pt's psychosis is impacting his ability to interpret reality and make safe decisions. His  judgment is further impaired by substance use which is contributing to psychiatric instability and increasing risk for impulsive and unpredictable behavior. Due to severity of psychosis, substance use, and impaired insight, he is a danger to self and others that necessitates inpatient psychiatric treatment. Affidavit and petition for involuntary commitment initiated and first exam completed.    Total Time spent with patient: 20 minutes  Past Psychiatric History: Schizophrenia,  has a past medical history of Anxiety, Bipolar 1 disorder (HCC), Depression, Drug-induced seizure (HCC), EtOH dependence (HCC), Hallucination, Paranoid schizophrenia (HCC), and PTSD (post-traumatic stress disorder).  Family History: family history includes Cancer in his mother; Diabetes in his father and maternal grandmother; Heart disease in his father and mother; Lung cancer in his mother; Stroke in his paternal grandfather.  Social History: Unemployed. Housing unstable.  Tobacco Cessation:  Prescription not provided because: pt transferred to another facility  Current Medications:  Current Facility-Administered Medications  Medication Dose Route Frequency Provider Last Rate Last Admin   acetaminophen  (TYLENOL ) tablet 650 mg  650 mg Oral Q6H PRN Kynlea Blackston E, NP       alum &  mag hydroxide-simeth (MAALOX/MYLANTA) 200-200-20 MG/5ML suspension 30 mL  30 mL Oral Q4H PRN Glenwood Revoir E, NP       haloperidol  (HALDOL ) tablet 5 mg  5 mg Oral TID PRN Almeter Westhoff E, NP   5 mg at 02/03/24 9262   And    diphenhydrAMINE  (BENADRYL ) capsule 50 mg  50 mg Oral TID PRN Jael Waldorf E, NP   50 mg at 02/03/24 9262   haloperidol  lactate (HALDOL ) injection 5 mg  5 mg Intramuscular TID PRN Shamarcus Hoheisel E, NP       And   diphenhydrAMINE  (BENADRYL ) injection 50 mg  50 mg Intramuscular TID PRN Euriah Matlack E, NP       And   LORazepam  (ATIVAN ) injection 2 mg  2 mg Intramuscular TID PRN Kaiea Esselman E, NP       haloperidol  lactate (HALDOL ) injection 10 mg  10 mg Intramuscular TID PRN Katye Valek E, NP       And   diphenhydrAMINE  (BENADRYL ) injection 50 mg  50 mg Intramuscular TID PRN Taci Sterling E, NP       And   LORazepam  (ATIVAN ) injection 2 mg  2 mg Intramuscular TID PRN Jontue Crumpacker E, NP       magnesium  hydroxide (MILK OF MAGNESIA) suspension 30 mL  30 mL Oral Daily PRN Loella Hickle E, NP       OLANZapine  (ZYPREXA ) tablet 10 mg  10 mg Oral QHS Darian Cansler E, NP       No current outpatient medications on file.    PTA Medications:  Facility Ordered Medications  Medication   acetaminophen  (TYLENOL ) tablet 650 mg   alum & mag hydroxide-simeth (MAALOX/MYLANTA) 200-200-20 MG/5ML suspension 30 mL   magnesium  hydroxide (MILK OF MAGNESIA) suspension 30 mL   haloperidol  (HALDOL ) tablet 5 mg   And   diphenhydrAMINE  (BENADRYL ) capsule 50 mg   haloperidol  lactate (HALDOL ) injection 5 mg   And   diphenhydrAMINE  (BENADRYL ) injection 50 mg   And   LORazepam  (ATIVAN ) injection 2 mg   haloperidol  lactate (HALDOL ) injection 10 mg   And   diphenhydrAMINE  (BENADRYL ) injection 50 mg   And   LORazepam  (ATIVAN ) injection 2 mg   [COMPLETED] OLANZapine  zydis (ZYPREXA ) disintegrating tablet 10 mg   [COMPLETED] OLANZapine  zydis (ZYPREXA ) disintegrating tablet 10 mg   OLANZapine  (ZYPREXA ) tablet 10 mg       09/11/2023    9:20 AM 09/10/2023   12:12 PM 09/06/2023    2:18 PM  Depression screen PHQ 2/9  Decreased Interest 0 0 1  Down, Depressed, Hopeless 0 0 1  PHQ - 2 Score 0 0 2  Altered sleeping 0 0 1   Tired, decreased energy 0 0 1  Change in appetite 0 0 0  Feeling bad or failure about yourself  0 0 1  Trouble concentrating 0 0 1  Moving slowly or fidgety/restless 0 0 1  Suicidal thoughts 0 0 0  PHQ-9 Score 0  0  7   Difficult doing work/chores   Very difficult     Data saved with a previous flowsheet row definition    Flowsheet Row ED from 02/02/2024 in [facility] ED from 01/13/2024 in [facility] Emergency Department at [facility] ED from 01/07/2024 in [facility] Emergency Department at [facility]  C-SSRS RISK CATEGORY Moderate Risk No Risk High Risk    Musculoskeletal  Strength & Muscle Tone: within normal limits Gait & Station: normal  Patient leans: N/A  Psychiatric Specialty Exam  Presentation  General Appearance:  Fairly Groomed  Eye Contact: Fair  Speech: Clear and Coherent (garbled at times)  Speech Volume: Normal  Handedness: Right   Mood and Affect  Mood: Anxious; Depressed  Affect: Congruent   Thought Process  Thought Processes: Coherent  Descriptions of Associations:Intact  Orientation:Full (Time, Place and Person)  Thought Content:Paranoid Ideation; Rumination  Diagnosis of Schizophrenia or Schizoaffective disorder in past: Yes  Duration of Psychotic Symptoms: Less than six months   Hallucinations:Hallucinations: Auditory Description of Auditory Hallucinations: voices telling me they want to kill me.  Ideas of Reference:Delusions; Paranoia  Suicidal Thoughts:Suicidal Thoughts: Yes, Passive SI Active Intent and/or Plan: With Plan  Homicidal Thoughts:Homicidal Thoughts: No   Sensorium  Memory: Immediate Fair; Recent Fair  Judgment: Fair  Insight: Poor   Executive Functions  Concentration: Fair  Attention Span: Fair  Recall: [employer] of Knowledge: Fair  Language: Fair   Psychomotor Activity  Psychomotor Activity: Psychomotor Activity:  Normal   Assets  Assets: Communication Skills; Desire for Improvement; Resilience   Sleep  Sleep: Sleep: Good  No Safety Checks orders active in given range  Nutritional Assessment (For OBS and FBC admissions only) Has the patient had a weight loss or gain of 10 pounds or more in the last 3 months?: No Has the patient had a decrease in food intake/or appetite?: No Does the patient have dental problems?: No Does the patient have eating habits or behaviors that may be indicators of an eating disorder including binging or inducing vomiting?: No Has the patient recently lost weight without trying?: 0 Has the patient been eating poorly because of a decreased appetite?: 0 Malnutrition Screening Tool Score: 0    Physical Exam  Physical Exam Vitals and nursing note reviewed.  HENT:     Head: Normocephalic.     Mouth/Throat:     Mouth: Mucous membranes are moist.  Cardiovascular:     Rate and Rhythm: Normal rate.  Pulmonary:     Effort: Pulmonary effort is normal.  Musculoskeletal:        General: Normal range of motion.  Skin:    General: Skin is warm and dry.  Neurological:     Mental Status: He is alert. He is disoriented.  Psychiatric:     Comments: See HPI    Review of Systems  Constitutional:  Negative for chills and fever.  HENT:  Negative for congestion and sore throat.   Respiratory:  Negative for cough and shortness of breath.   Cardiovascular:  Negative for chest pain and palpitations.  Gastrointestinal:  Negative for diarrhea, nausea and vomiting.  Psychiatric/Behavioral:  Positive for hallucinations, substance abuse and suicidal ideas. The patient is nervous/anxious.    Blood pressure 117/84, pulse 90, temperature 97.7 F (36.5 C), temperature source Oral, resp. rate 19, SpO2 98%. There is no height or weight on file to calculate BMI.  Demographic Factors:  Male, Caucasian, Low socioeconomic status, Unemployed, and unhoused  Loss Factors: Financial  problems/change in socioeconomic status  Disposition: Pt has been accepted to [facility] to Dr Oliva Salmon and may transfer today when bed is available.   Sherrell Culver, PMHNP-BC, FNP-BC  02/03/2024, 12:08 PM

## 2024-02-04 ENCOUNTER — Encounter (HOSPITAL_COMMUNITY): Payer: Self-pay

## 2024-02-04 MED ORDER — HALOPERIDOL 5 MG PO TABS
5.0000 mg | ORAL_TABLET | Freq: Two times a day (BID) | ORAL | Status: DC
  Administered 2024-02-04 – 2024-02-08 (×8): 5 mg via ORAL
  Filled 2024-02-04 (×4): qty 1
  Filled 2024-02-04: qty 28
  Filled 2024-02-04 (×4): qty 1

## 2024-02-04 MED ORDER — ADULT MULTIVITAMIN W/MINERALS CH
1.0000 | ORAL_TABLET | Freq: Every day | ORAL | Status: DC
  Administered 2024-02-04 – 2024-02-08 (×5): 1 via ORAL
  Filled 2024-02-04 (×3): qty 1
  Filled 2024-02-04: qty 14
  Filled 2024-02-04 (×2): qty 1

## 2024-02-04 MED ORDER — NALTREXONE HCL 50 MG PO TABS
25.0000 mg | ORAL_TABLET | Freq: Every day | ORAL | Status: DC
  Administered 2024-02-04 – 2024-02-05 (×2): 25 mg via ORAL
  Filled 2024-02-04 (×2): qty 1

## 2024-02-04 MED ORDER — HALOPERIDOL 5 MG PO TABS
5.0000 mg | ORAL_TABLET | Freq: Once | ORAL | Status: AC
  Administered 2024-02-04: 5 mg via ORAL
  Filled 2024-02-04: qty 1

## 2024-02-04 MED ORDER — HYDROXYZINE HCL 50 MG PO TABS
50.0000 mg | ORAL_TABLET | Freq: Four times a day (QID) | ORAL | Status: DC | PRN
  Administered 2024-02-05 – 2024-02-08 (×4): 50 mg via ORAL
  Filled 2024-02-04 (×6): qty 1

## 2024-02-04 MED ORDER — LORAZEPAM 1 MG PO TABS
1.0000 mg | ORAL_TABLET | ORAL | Status: AC | PRN
  Administered 2024-02-04 – 2024-02-05 (×2): 1 mg via ORAL
  Filled 2024-02-04 (×3): qty 1

## 2024-02-04 NOTE — H&P (Signed)
 Psychiatric Admission Assessment Adult  Patient Identification: Jerry [name]  MRN:  [id number]  Date of Evaluation:  02/04/24  Chief Complaint:  Substance induced mood disorder (HCC) [F19.94]   Principal Diagnosis: Substance induced mood disorder (HCC)  Diagnosis:  Principal Problem:   Substance induced mood disorder (HCC)    Chief Complaint: Man, I keep hearing voices does not matter if I am sober from stoned   History of Present Illness: Jerry [name] is a 48 y.o. who  has a past medical history of Anxiety, Bipolar 1 disorder (HCC), Depression, Drug-induced seizure (HCC), EtOH dependence (HCC), Hallucination, Paranoid schizophrenia (HCC), and PTSD (post-traumatic stress disorder).  He presented to [facility] for Substance induced mood disorder (HCC).  Patient reports persistent auditory hallucinations in the setting of medication noncompliance and polysubstance abuse.  He reports that he has tried Risperdal  and Haldol  and found that Haldol  was helpful for auditory hallucinations but Risperdal  was not.  He reports that he has been drinking about 2/5 of vodka a day.  States he has a history of alcohol withdrawal seizures.  His CIWA was a 4 on my exam.  He is homeless and has a history of poor medication compliance.  He reports that his goal of treatment is to get into a drug and alcohol treatment program.  He tells me he has been doing every drug he can get his hands on with the exception of heroin and fentanyl because he believes those to be too dangerous.  Urine drug screen is positive for buprenorphine, oxazepam, cocaine, and cannabis.  Patient is IVC'd, but will convert to voluntary as he wants treatment and an element of secondary gain cannot be excluded.   Past Psychiatric History: He  has a past medical history of Anxiety, Bipolar 1 disorder (HCC), Depression, Drug-induced seizure (HCC), EtOH dependence (HCC), Hallucination, Paranoid schizophrenia (HCC), and PTSD (post-traumatic stress  disorder).   Is the patient at risk to self?  Yes Has the patient been a risk to self in the past 6 months? No Has the patient been a risk to self within the distant past? No Is the patient a risk to others? No Has the patient been a risk to others in the past 6 months? No Has the patient been a risk to others within the distant past? No  Columbia Scale:  Flowsheet Row Ad[facility] (Current) [facility] from 02/02/2024 in [facility] ED from 01/13/2024 in [facility] Department at [facility]  C-SSRS RISK CATEGORY Moderate Risk Moderate Risk No Risk       Prior Inpatient Therapy: Yes Prior Outpatient Therapy: No  Alcohol Screening:  1. How often do you have a drink containing alcohol?: Monthly or less 2. How many drinks containing alcohol do you have on a typical day when you are drinking?: 10 or more 3. How often do you have six or more drinks on one occasion?: Monthly AUDIT-C Score: 7 4. How often during the last year have you found that you were not able to stop drinking once you had started?: Daily or almost daily 5. How often during the last year have you failed to do what was normally expected from you because of drinking?: Daily or almost daily 6. How often during the last year have you needed a first drink in the morning to get yourself going after a heavy drinking session?: Daily or almost daily 7. How often during the last year  have you had a feeling of guilt of remorse after drinking?: Daily or almost daily 8. How often during the last year have you been unable to remember what happened the night before because you had been drinking?: Daily or almost daily 9. Have you or someone else been injured as a result of your drinking?: Yes, during the last year 10. Has a relative or friend or a doctor or another health worker been concerned about your drinking or suggested you cut down?:  Yes, during the last year Alcohol Use Disorder Identification Test Final Score (AUDIT): 35 Alcohol Brief Interventions/Follow-up: Alcohol education/Brief advice  Substance Abuse History in the last 12 months: Significant as above Consequences of Substance Abuse: NA  Previous Psychotropic Medications: Yes Psychological Evaluations: No  Past Medical History:  Past Medical History:  Diagnosis Date   Anxiety    Bipolar 1 disorder (HCC)    Depression    Drug-induced seizure (HCC)    EtOH dependence (HCC)    Hallucination    Paranoid schizophrenia (HCC)    since pt's 20's   PTSD (post-traumatic stress disorder)      Family Psychiatric & Medical History:  Family History  Problem Relation Age of Onset   Heart disease Mother    Cancer Mother    Lung cancer Mother    Heart disease Father    Diabetes Father    Diabetes Maternal Grandmother    Stroke Paternal Grandfather      Tobacco Screening:  Social History   Tobacco Use  Smoking Status Every Day   Current packs/day: 1.00   Types: Cigarettes  Smokeless Tobacco Never      Social History:  Social History   Substance and Sexual Activity  Alcohol Use Yes   Comment: Decreased from 12 cans of beer and 20 shots of liquor about 2 months ago      Additional Social History: Marital status: Separated Separated, when?: 10 years ago What types of issues is patient dealing with in the relationship?: She cheated on me a lot. Additional relationship information: none reported Are you sexually active?: No What is your sexual orientation?: heterosexual Has your sexual activity been affected by drugs, alcohol, medication, or emotional stress?: yes Does patient have children?: Yes How many children?: 3 How is patient's relationship with their children?: I have 3 boys.  I don't see them.     Allergies:   Allergies  Allergen Reactions   Penicillins Swelling    Childhood reaction     Lab Results:  Results for  orders placed or performed during the hospital encounter of 02/02/24 (from the past 48 hours)  Comprehensive metabolic panel     Status: Abnormal   Collection Time: 02/03/24 10:06 AM  Result Value Ref Range   Sodium 135 135 - 145 mmol/L   Potassium 4.0 3.5 - 5.1 mmol/L   Chloride 102 98 - 111 mmol/L   CO2 23 22 - 32 mmol/L   Glucose, Bld 110 (H) 70 - 99 mg/dL    Comment: Glucose reference range applies only to samples taken after fasting for at least 8 hours.   BUN 6 6 - 20 mg/dL   Creatinine, Ser 9.26 0.61 - 1.24 mg/dL   Calcium 9.0 8.9 - 89.6 mg/dL   Total Protein 7.5 6.5 - 8.1 g/dL   Albumin 3.0 (L) 3.5 - 5.0 g/dL   AST 72 (H) 15 - 41 U/L   ALT 87 (H) 0 - 44 U/L   Alkaline Phosphatase 60 38 -  126 U/L   Total Bilirubin 1.0 0.0 - 1.2 mg/dL   GFR, Estimated >39 >39 mL/min    Comment: (NOTE) Calculated using the CKD-EPI Creatinine Equation (2021)    Anion gap 10 5 - 15    Comment: Performed at [facility], [address] [place], [place] [address]  CBC with Differential/Platelet     Status: Abnormal   Collection Time: 02/03/24 10:06 AM  Result Value Ref Range   WBC 7.1 4.0 - 10.5 K/uL   RBC 4.23 4.22 - 5.81 MIL/uL   Hemoglobin 13.2 13.0 - 17.0 g/dL   HCT 60.3 60.9 - 47.9 %   MCV 93.6 80.0 - 100.0 fL   MCH 31.2 26.0 - 34.0 pg   MCHC 33.3 30.0 - 36.0 g/dL   RDW 85.5 88.4 - 84.4 %   Platelets 408 (H) 150 - 400 K/uL   nRBC 0.0 0.0 - 0.2 %   Neutrophils Relative % 57 %   Neutro Abs 4.1 1.7 - 7.7 K/uL   Lymphocytes Relative 22 %   Lymphs Abs 1.5 0.7 - 4.0 K/uL   Monocytes Relative 15 %   Monocytes Absolute 1.1 (H) 0.1 - 1.0 K/uL   Eosinophils Relative 5 %   Eosinophils Absolute 0.3 0.0 - 0.5 K/uL   Basophils Relative 1 %   Basophils Absolute 0.1 0.0 - 0.1 K/uL   Immature Granulocytes 0 %   Abs Immature Granulocytes 0.02 0.00 - 0.07 K/uL    Comment: Performed at [facility], [address], [place], [place] [address]  Ethanol     Status: None   Collection  Time: 02/03/24 10:06 AM  Result Value Ref Range   Alcohol, Ethyl (B) <15 <15 mg/dL    Comment: (NOTE) For medical purposes only. Performed at [facility], [address], [place], [place] [address]      Blood Alcohol level:  Lab Results  Component Value Date   [facility] <15 02/03/2024   ETH <15 01/13/2024    Metabolic Disorder Labs:  Lab Results  Component Value Date   HGBA1C 4.8 07/19/2023   MPG 91.06 07/19/2023   MPG 105.41 07/09/2022   No results found for: PROLACTIN  Lab Results  Component Value Date   CHOL 168 07/19/2023   TRIG 120 07/19/2023   HDL 42 07/19/2023   VLDL 24 07/19/2023   LDLCALC 102 (H) 07/19/2023   LDLCALC 78 07/09/2022      Current Medications: Current Facility-Administered Medications  Medication Dose Route Frequency Provider Last Rate Last Admin   acetaminophen  (TYLENOL ) tablet 650 mg  650 mg Oral Q6H PRN Hobson, Fran E, NP       alum & mag hydroxide-simeth (MAALOX/MYLANTA) 200-200-20 MG/5ML suspension 30 mL  30 mL Oral Q4H PRN Hobson, Fran E, NP       haloperidol  (HALDOL ) tablet 5 mg  5 mg Oral TID PRN Hobson, Fran E, NP       And   diphenhydrAMINE  (BENADRYL ) capsule 50 mg  50 mg Oral TID PRN Hobson, Fran E, NP       haloperidol  lactate (HALDOL ) injection 5 mg  5 mg Intramuscular TID PRN Hobson, Fran E, NP       And   diphenhydrAMINE  (BENADRYL ) injection 50 mg  50 mg Intramuscular TID PRN Hobson, Fran E, NP       And   LORazepam  (ATIVAN ) injection 2 mg  2 mg Intramuscular TID PRN Hobson, Fran E, NP  haloperidol  lactate (HALDOL ) injection 10 mg  10 mg Intramuscular TID PRN Hobson, Fran E, NP       And   diphenhydrAMINE  (BENADRYL ) injection 50 mg  50 mg Intramuscular TID PRN Hobson, Fran E, NP       And   LORazepam  (ATIVAN ) injection 2 mg  2 mg Intramuscular TID PRN Hobson, Fran E, NP       haloperidol  (HALDOL ) tablet 5 mg  5 mg Oral BID Analyse Angst S, MD   5 mg at 02/04/24 1700   hydrOXYzine  (ATARAX ) tablet 50 mg  50 mg Oral  Q6H PRN Danira Nylander S, MD       LORazepam  (ATIVAN ) tablet 1-4 mg  1-4 mg Oral Q1H PRN Banesa Tristan S, MD   1 mg at 02/04/24 1700   magnesium  hydroxide (MILK OF MAGNESIA) suspension 30 mL  30 mL Oral Daily PRN Hobson, Fran E, NP       multivitamin with minerals tablet 1 tablet  1 tablet Oral Daily Eveleen Mcnear S, MD   1 tablet at 02/04/24 1700   naltrexone (DEPADE) tablet 25 mg  25 mg Oral Daily Gussie Murton S, MD   25 mg at 02/04/24 1301   nicotine  (NICODERM CQ  - dosed in mg/24 hours) patch 14 mg  14 mg Transdermal Daily Bouchard, Marc A, DO        PTA Medications: No medications prior to admission.     Musculoskeletal: Strength & Muscle Tone: within normal limits Gait & Station: normal Patient leans: N/A    Psychiatric Specialty Exam:  Presentation  General Appearance: Fairly Groomed  Eye Contact: Good  Speech: Slow  Speech Volume: Decreased  Handedness: Right   Mood and Affect  Mood: Depressed; Anxious  Affect: Restricted; Congruent   Thought Process  Thought Processes: Linear  Descriptions of Associations: Intact  Orientation: Full (Time, Place and Person)  Thought Content: Logical  History of Schizophrenia/Schizoaffective disorder: Yes  Duration of Psychotic Symptoms: NA Hallucinations: Reports auditory hallucinations  Ideas of Reference: None  Suicidal Thoughts: Suicidal Thoughts: No SI Active Intent and/or Plan: Without Plan  Homicidal Thoughts: Homicidal Thoughts: No   Sensorium  Memory: Immediate Good; Recent Good  Judgment: Fair  Insight: Good   Executive Functions  Concentration: Fair  Attention Span: Good  Recall: Good  Fund of Knowledge: Good  Language: Good   Psychomotor Activity  Psychomotor Activity: Psychomotor Activity: Restlessness   Assets  Assets: Communication Skills; Desire for Improvement;    Sleep  Sleep: Sleep: Poor Number of Hours of Sleep: 8    Physical Exam: General: Sitting comfortably.  NAD. HEENT: Normocephalic, atraumatic, MMM, EMOI Lungs: no increased work of breathing noted Heart: no cyanosis Abdomen: Non distended Musculoskeletal: FROM. No obvious deformities Skin: Warm, dry, intact. No rashes noted Neuro: No obvious focal deficits.  Gait and station are normal  Review of Systems  Constitutional: Negative.   HENT: Negative.    Eyes: Negative.   Respiratory: Negative.    Cardiovascular: Negative.   Gastrointestinal: Negative.   Genitourinary: Negative.   Skin: Negative.   Neurological: Negative.   Psychiatric/Behavioral:  Positive for anxiety, alcohol withdrawal, auditory hallucinations.     Blood pressure 106/80, pulse (!) 115, temperature 97.9 F (36.6 C), temperature source Oral, resp. rate 17, height 5' 9.02 (1.753 m), weight 80.7 kg, SpO2 98%. Body mass index is 26.27 kg/m.   Treatment Plan Summary: ASSESSMENT: Jerry [name] is an 48 y.o. male who  has a past medical history of  Anxiety, Bipolar 1 disorder (HCC), Depression, Drug-induced seizure (HCC), EtOH dependence (HCC), Hallucination, Paranoid schizophrenia (HCC), and PTSD (post-traumatic stress disorder).  He presented on 02/03/2024 11:27 PM for Substance induced mood disorder (HCC).    Diagnoses / Active Problems: Patient Active Problem List   Diagnosis Date Noted   Stimulant use disorder 01/14/2024   Alcohol use disorder 09/07/2023   Cocaine use disorder, moderate, dependence (HCC) 09/04/2023   Alcohol use disorder, moderate, dependence (HCC) 09/04/2023   Protein-calorie malnutrition, severe 07/03/2023   Left arm cellulitis 07/01/2023   Jaw pain 11/29/2022   Need for influenza vaccination 11/29/2022   Erectile dysfunction 11/29/2022   Hyponatremia 11/29/2022   History of hepatitis C virus infection 11/29/2022   Tardive dyskinesia 10/24/2022   Generalized anxiety disorder 10/24/2022   PTSD (post-traumatic stress disorder) 10/24/2022   Healthcare maintenance 10/24/2022   Alcohol  withdrawal (HCC) 07/22/2022   Schizoaffective disorder, bipolar type (HCC) 07/09/2022   Schizophrenia (HCC) 07/07/2022   Auditory hallucination    Malingering 09/07/2020   Homelessness 09/07/2020   Substance induced mood disorder (HCC) 09/07/2020   Major depressive disorder, recurrent severe without psychotic features (HCC) 07/17/2020   Schizophrenia spectrum disorder with psychotic disorder type not yet determined (HCC) 07/17/2020   Methamphetamine abuse (HCC) 07/17/2020   Marijuana abuse 07/17/2020   Cocaine abuse (HCC) 07/17/2020   Anxiety and depression 06/25/2014   Family history of diabetes mellitus (DM) 06/25/2014   Family history of thyroid  disease 06/25/2014   Tobacco use disorder 06/25/2014   Alcohol abuse 05/18/2013   Polysubstance abuse (HCC) 05/18/2013   Depression, unspecified 05/18/2013     PLAN: Safety and Monitoring:  -- Voluntary admission to inpatient psychiatric unit for safety, stabilization and treatment  -- Daily contact with patient to assess and evaluate symptoms and progress in treatment  -- Patient's case to be discussed in multi-disciplinary team meeting  -- Observation Level : q15 minute checks  -- Vital signs:  q12 hours  -- Precautions: suicide, elopement, and assault  2. Psychiatric Diagnoses and Treatment:  Patient Active Problem List   Diagnosis Date Noted   Stimulant use disorder 01/14/2024   Alcohol use disorder 09/07/2023   Cocaine use disorder, moderate, dependence (HCC) 09/04/2023   Alcohol use disorder, moderate, dependence (HCC) 09/04/2023   Protein-calorie malnutrition, severe 07/03/2023   Left arm cellulitis 07/01/2023   Jaw pain 11/29/2022   Need for influenza vaccination 11/29/2022   Erectile dysfunction 11/29/2022   Hyponatremia 11/29/2022   History of hepatitis C virus infection 11/29/2022   Tardive dyskinesia 10/24/2022   Generalized anxiety disorder 10/24/2022   PTSD (post-traumatic stress disorder) 10/24/2022    Healthcare maintenance 10/24/2022   Alcohol withdrawal (HCC) 07/22/2022   Schizoaffective disorder, bipolar type (HCC) 07/09/2022   Schizophrenia (HCC) 07/07/2022   Auditory hallucination    Malingering 09/07/2020   Homelessness 09/07/2020   Substance induced mood disorder (HCC) 09/07/2020   Major depressive disorder, recurrent severe without psychotic features (HCC) 07/17/2020   Schizophrenia spectrum disorder with psychotic disorder type not yet determined (HCC) 07/17/2020   Methamphetamine abuse (HCC) 07/17/2020   Marijuana abuse 07/17/2020   Cocaine abuse (HCC) 07/17/2020   Anxiety and depression 06/25/2014   Family history of diabetes mellitus (DM) 06/25/2014   Family history of thyroid  disease 06/25/2014   Tobacco use disorder 06/25/2014   Alcohol abuse 05/18/2013   Polysubstance abuse (HCC) 05/18/2013   Depression, unspecified 05/18/2013     Scheduled Medications:  haloperidol   5 mg Oral BID   multivitamin with  minerals  1 tablet Oral Daily   naltrexone  25 mg Oral Daily   nicotine   14 mg Transdermal Daily     As Needed Medications: acetaminophen , alum & mag hydroxide-simeth, haloperidol  **AND** diphenhydrAMINE , haloperidol  lactate **AND** diphenhydrAMINE  **AND** LORazepam , haloperidol  lactate **AND** diphenhydrAMINE  **AND** LORazepam , hydrOXYzine , LORazepam , magnesium  hydroxide    3. Medical Issues Being Addressed:     Tobacco Use Disorder  -- Replace as above  -- Smoking cessation encouraged  4. Discharge Planning:   -- Social work and case management to assist with discharge planning and identification of hospital follow-up needs prior to discharge  -- Estimated LOS: 5-7 days  -- Discharge Concerns: Need to establish a safety plan; Medication compliance and effectiveness  -- Discharge Goals: Return home with outpatient referrals for mental health follow-up including medication management/psychotherapy  5. Short Term Goals:  Improve ability to identify changes in  lifestyle to reduce recurrence of condition, verbalize feelings, disclose and discuss suicidal ideas, demonstrate self-control, identify and develop effective coping behaviors, compliance with prescribed medications, identify triggers associated with substance abuse/mental health issues, participate in unit milieu and in scheduled group therapies   6. Long Term Goals: Improvement in symptoms so the patient is ready for discharge   --The risks/benefits/side-effects/alternatives to the medications above were discussed in detail with the patient and time was given for questions. The patient provided informed consent.   -- Metabolic profile and EKG monitoring obtained while on an atypical antipsychotic and listed in the EHR    Total Time Spent in Direct Patient Care:  I personally spent 60 minutes on the unit in direct patient care. The direct patient care time included face-to-face time with the patient, reviewing the patient's chart, communicating with other professionals, and coordinating care. Greater than 50% of this time was spent in counseling or coordinating care with the patient regarding goals of hospitalization, psycho-education, and discharge planning needs.   I certify that inpatient services furnished can reasonably be expected to improve the patient's condition.    Glendia Kitty, MD 02/04/2024, 6:38 PM      Portions of this note were created using voice recognition software. Minor syntax errors, grammatical content, spelling, or punctuation errors may have occurred unintentionally. Please notify the dino if the meaning of any statement is unclear.

## 2024-02-04 NOTE — Plan of Care (Signed)
  Problem: Education: Goal: Emotional status will improve Outcome: Progressing Goal: Mental status will improve Outcome: Progressing   Problem: Activity: Goal: Sleeping patterns will improve Outcome: Progressing   Problem: Safety: Goal: Periods of time without injury will increase Outcome: Progressing   Problem: Activity: Goal: Interest or engagement in activities will improve Outcome: Not Progressing

## 2024-02-04 NOTE — [facility] Suicide Risk Assessment (Signed)
 [facility] INPATIENT:  Family/Significant Other Suicide Prevention Education  Suicide Prevention Education:  Patient Refusal for Family/Significant Other Suicide Prevention Education: The patient Jerry [name] has refused to provide written consent for family/significant other to be provided Family/Significant Other Suicide Prevention Education during admission and/or prior to discharge.  Physician notified.  Adenike Shidler O Jamye Balicki, LCSWA 02/04/2024, 6:48 PM

## 2024-02-04 NOTE — Progress Notes (Signed)
 Collateral contact - [facility]  - [address], [place], [place] [address], [phone]  CSW was informed that they don't provide substance use treatment but guardianship services and housing for people with developmental disabilities and severe and persistent mental illness.  CSW would need to contact [email or web address] and ask for an application.   Kristin Lamagna, LCSWA 02/04/2024

## 2024-02-04 NOTE — [facility] Group Notes (Signed)
 Adult Psychoeducational Group Note  Date:  02/04/2024 Time:  1:47 PM  Group Topic/Focus: Socialwork Group Dimensions of Wellness:   The focus of this group is to introduce the topic of wellness and discuss the role each dimension of wellness plays in total health.  Participation Level:    Participation Quality:    Affect:   Cognitive:    Insight:   Engagement in Group:    Modes of Intervention:    Additional Comments:    Eston Heslin Lee 02/04/2024, 1:47 PM

## 2024-02-04 NOTE — [facility] Group Notes (Signed)
 Adult Psychoeducational Group Note  Date:  02/04/2024 Time:  1:25 PM  Group Topic/Focus: Recreation Therapy Self Care:   The focus of this group is to help patients understand the importance of self-care in order to improve or restore emotional, physical, spiritual, interpersonal, and financial health.  Participation Level:  Did Not Attend  Participation Quality:    Affect:    Cognitive:    Insight:   Engagement in Group:    Modes of Intervention:    Additional Comments:    Princetta Uplinger Lee 02/04/2024, 1:25 PM

## 2024-02-04 NOTE — Plan of Care (Signed)

## 2024-02-04 NOTE — Progress Notes (Signed)
(  Sleep Hours) -10  (Any PRNs that were needed, meds refused, or side effects to meds)- none  (Any disturbances and when (visitation, over night)-none  (Concerns raised by the patient)- pt stated AH the same  (SI/HI/AVH)- AH

## 2024-02-04 NOTE — [facility] Group Notes (Signed)
 [facility] Group Notes:  (Nursing/MHT/Case Management/Adjunct)  Date:  02/04/2024  Time:  3:29 PM  Type of Therapy:  Group Therapy  Participation Level:  Minimal  Participation Quality:  Attentive  Affect:  Flat  Cognitive:  Lacking  Insight:  Appropriate  Engagement in Group:  None  Modes of Intervention:  Discussion   Jerry [name] 02/04/2024, 3:29 PM

## 2024-02-04 NOTE — [facility] Group Notes (Signed)
 [facility] Group Notes:  (Nursing/MHT/Case Management/Adjunct)  Date:  02/04/2024  Time:  11:06 PM  Type of Therapy:  Wrap up group  Participation Level:  Did Not Attend  Participation Quality:    Affect:    Cognitive:    Insight:    Engagement in Group:    Modes of Intervention:    Summary of Progress/Problems:  Grayce LITTIE Essex 02/04/2024, 11:06 PM

## 2024-02-04 NOTE — Group Note (Addendum)
 LCSW Group Therapy Note   Group Date: 02/04/2024 Start Time: 1100 End Time: 1200   Participation:  did not attend  Type of Therapy:  Group Therapy  Topic: Shining from Within: Confidence and Self-Love Journey  Objective:  To support participants in developing confidence and self-love through self-awareness, self-compassion, and practical skills that nurture personal growth.   Group Goals Encourage self-reflection and self-acceptance by identifying personal strengths and achievements. Teach skills to challenge negative self-talk and replace it with supportive, truthful self-talk. Foster resilience and self-worth through owens & minor, gratitude, and self-care practices.   Summary:  This group explores the connection between confidence and self-love by guiding participants through reflection, mindset shifts, and practical tools like affirmations, strength recognition, and goal-setting. Activities are designed to promote self-compassion, build emotional resilience, and normalize the slow, patient journey of inner growth.   Therapeutic Modalities Used Cognitive Behavioral Therapy (CBT): Challenging and reframing unhelpful self-talk. Motivational Interviewing (MI): Encouraging small, achievable goals. Elements of Dialectical Behavioral Therapist (DBT):  Mindfulness and Self-Compassion: Promoting present-moment awareness and kindness toward self.   Romon Devereux O Mayra Jolliffe, LCSWA 02/04/2024  5:32 PM

## 2024-02-04 NOTE — BH IP Treatment Plan (Signed)
 Interdisciplinary Treatment and Diagnostic Plan Update  02/04/2024 Time of Session: 10:55 AM Jerry [name] MRN: [id number]  Principal Diagnosis: Substance induced mood disorder (HCC)  Secondary Diagnoses: Principal Problem:   Substance induced mood disorder (HCC)   Current Medications:  Current Facility-Administered Medications  Medication Dose Route Frequency Provider Last Rate Last Admin   acetaminophen  (TYLENOL ) tablet 650 mg  650 mg Oral Q6H PRN Hobson, Fran E, NP       alum & mag hydroxide-simeth (MAALOX/MYLANTA) 200-200-20 MG/5ML suspension 30 mL  30 mL Oral Q4H PRN Hobson, Fran E, NP       haloperidol  (HALDOL ) tablet 5 mg  5 mg Oral TID PRN Hobson, Fran E, NP       And   diphenhydrAMINE  (BENADRYL ) capsule 50 mg  50 mg Oral TID PRN Hobson, Fran E, NP       haloperidol  lactate (HALDOL ) injection 5 mg  5 mg Intramuscular TID PRN Hobson, Fran E, NP       And   diphenhydrAMINE  (BENADRYL ) injection 50 mg  50 mg Intramuscular TID PRN Hobson, Fran E, NP       And   LORazepam  (ATIVAN ) injection 2 mg  2 mg Intramuscular TID PRN Hobson, Fran E, NP       haloperidol  lactate (HALDOL ) injection 10 mg  10 mg Intramuscular TID PRN Hobson, Fran E, NP       And   diphenhydrAMINE  (BENADRYL ) injection 50 mg  50 mg Intramuscular TID PRN Hobson, Fran E, NP       And   LORazepam  (ATIVAN ) injection 2 mg  2 mg Intramuscular TID PRN Hobson, Fran E, NP       haloperidol  (HALDOL ) tablet 5 mg  5 mg Oral BID Parker, Alvin S, MD       magnesium  hydroxide (MILK OF MAGNESIA) suspension 30 mL  30 mL Oral Daily PRN Hobson, Fran E, NP       naltrexone (DEPADE) tablet 25 mg  25 mg Oral Daily Parker, Alvin S, MD   25 mg at 02/04/24 1301   nicotine  (NICODERM CQ  - dosed in mg/24 hours) patch 14 mg  14 mg Transdermal Daily Bouchard, Marc A, DO       PTA Medications: No medications prior to admission.    Patient Stressors:    Patient Strengths:    Treatment Modalities: Medication Management, Group therapy,  Case management,  1 to 1 session with clinician, Psychoeducation, Recreational therapy.   Physician Treatment Plan for Primary Diagnosis: Substance induced mood disorder (HCC) Long Term Goal(s):     Short Term Goals:    Medication Management: Evaluate patient's response, side effects, and tolerance of medication regimen.  Therapeutic Interventions: 1 to 1 sessions, Unit Group sessions and Medication administration.  Evaluation of Outcomes: Not Progressing  Physician Treatment Plan for Secondary Diagnosis: Principal Problem:   Substance induced mood disorder (HCC)  Long Term Goal(s):     Short Term Goals:       Medication Management: Evaluate patient's response, side effects, and tolerance of medication regimen.  Therapeutic Interventions: 1 to 1 sessions, Unit Group sessions and Medication administration.  Evaluation of Outcomes: Not Progressing   RN Treatment Plan for Primary Diagnosis: Substance induced mood disorder (HCC) Long Term Goal(s): Knowledge of disease and therapeutic regimen to maintain health will improve  Short Term Goals: Ability to remain free from injury will improve, Ability to verbalize frustration and anger appropriately will improve, Ability to demonstrate self-control, Ability to participate in decision  making will improve, Ability to verbalize feelings will improve, Ability to disclose and discuss suicidal ideas, Ability to identify and develop effective coping behaviors will improve, and Compliance with prescribed medications will improve  Medication Management: RN will administer medications as ordered by provider, will assess and evaluate patient's response and provide education to patient for prescribed medication. RN will report any adverse and/or side effects to prescribing provider.  Therapeutic Interventions: 1 on 1 counseling sessions, Psychoeducation, Medication administration, Evaluate responses to treatment, Monitor vital signs and CBGs as  ordered, Perform/monitor CIWA, COWS, AIMS and Fall Risk screenings as ordered, Perform wound care treatments as ordered.  Evaluation of Outcomes: Not Progressing   LCSW Treatment Plan for Primary Diagnosis: Substance induced mood disorder (HCC) Long Term Goal(s): Safe transition to appropriate next level of care at discharge, Engage patient in therapeutic group addressing interpersonal concerns.  Short Term Goals: Engage patient in aftercare planning with referrals and resources, Increase social support, Increase ability to appropriately verbalize feelings, Increase emotional regulation, Facilitate acceptance of mental health diagnosis and concerns, Facilitate patient progression through stages of change regarding substance use diagnoses and concerns, Identify triggers associated with mental health/substance abuse issues, and Increase skills for wellness and recovery  Therapeutic Interventions: Assess for all discharge needs, 1 to 1 time with Social worker, Explore available resources and support systems, Assess for adequacy in community support network, Educate family and significant other(s) on suicide prevention, Complete Psychosocial Assessment, Interpersonal group therapy.  Evaluation of Outcomes: Not Progressing   Progress in Treatment: Attending groups: No. Participating in groups: No. Taking medication as prescribed: Yes. Toleration medication: Yes. Family/Significant other contact made: No, will contact:  consents pending.  Patient understands diagnosis: Yes. Discussing patient identified problems/goals with staff: Yes. Medical problems stabilized or resolved: Yes. Denies suicidal/homicidal ideation: Yes. Issues/concerns per patient self-inventory: No. None reported.  New problem(s) identified: No, Describe:  None identified.  New Short Term/Long Term Goal(s): detox, medication management for mood stabilization; elimination of SI thoughts; development of comprehensive mental  wellness/sobriety plan    Patient Goals:  Getting my medications and go to a program for alcohol and drugs.  Discharge Plan or Barriers: Patient recently admitted. CSW will continue to follow and assess for appropriate referrals and possible discharge planning.    Reason for Continuation of Hospitalization: Hallucinations Medication stabilization Withdrawal symptoms  Estimated Length of Stay: 5-7 days.  Last 3 [place] Suicide Severity Risk Score: Flowsheet Row [facility] (Current) [facility] from 02/02/2024 in [facility] ED from 01/13/2024 in [facility] at [facility]  C-SSRS RISK CATEGORY Moderate Risk Moderate Risk No Risk    Last [facility] 2/9 Scores:    09/11/2023    9:20 AM 09/10/2023   12:12 PM 09/06/2023    2:18 PM  Depression screen PHQ 2/9  Decreased Interest 0 0 1  Down, Depressed, Hopeless 0 0 1  PHQ - 2 Score 0 0 2  Altered sleeping 0 0 1  Tired, decreased energy 0 0 1  Change in appetite 0 0 0  Feeling bad or failure about yourself  0 0 1  Trouble concentrating 0 0 1  Moving slowly or fidgety/restless 0 0 1  Suicidal thoughts 0 0 0  PHQ-9 Score 0  0  7   Difficult doing work/chores   Very difficult     Data saved with a previous flowsheet row definition    Scribe for Treatment Team:  Louetta Lame, LCSWA 02/04/2024 2:11 PM

## 2024-02-04 NOTE — Group Note (Signed)
 Recreation Therapy Group Note   Group Topic:Coping Skills  Group Date: 02/04/2024 Start Time: 1035 End Time: 1100 Facilitators: Jamella Grayer-McCall, LRT,CTRS Location: 500 Hall Dayroom   Group Topic: Coping Skills   Goal Area(s) Addresses: Patient will define what a coping skill is. Patient will create a list of healthy coping skills beginning with each letter of the alphabet. Patient will successfully identify positive coping skills they can use post d/c.   Behavioral Response:    Intervention: Worksheet   Activity: Coping A to Z. Patient asked to identify what a coping skill is and when they use them. Patients with clinical research associate discussed healthy versus unhealthy coping skills. Next patients were given a blank worksheet titled Coping Skills A-Z. Patients were instructed to come up with at least one positive coping skill per letter of the alphabet. Patients were given 15 minutes to brainstorm before ideas were presented to the large group. Patients and LRT debriefed on the importance of coping skill selection based on situation and back-up plans when a skill tried is not effective. At the end of group, patients were given an handout of alphabetized strategies to keep for future reference.   Education: Pharmacologist, Scientist, Physiological, Discharge Planning.    Education Outcome: Acknowledges education/Verbalizes understanding/In group clarification offered/Additional education needed    Clinical Observations/Individualized Feedback: LRT didn't conduct group due to assisting with dayroom duties.    Plan: Continue to engage patient in RT group sessions 2-3x/week.   Jerry [name], LRT,CTRS 02/04/2024 1:38 PM
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
## 2024-02-04 NOTE — Progress Notes (Signed)
 Conversation with patient:  CSW gave patient a list of shelter resources.  Patient said he doesn't have guns or weapons or access to guns or weapons.   Desirre Eickhoff, LCSWA 02/04/2024

## 2024-02-04 NOTE — Progress Notes (Signed)
(  Sleep Hours) - 6 (Any PRNs that were needed, meds refused, or side effects to meds)-none (Any disturbances and when (visitation, over night) (Concerns raised by the patient)- none (SI/HI/AVH)- endorse AH

## 2024-02-04 NOTE — BHH Suicide Risk Assessment (Addendum)
 [facility] Admission Suicide Risk Assessment   Nursing information obtained from:    Demographic factors:  Male, Unemployed, Living alone, Caucasian Current Mental Status:  Suicidal ideation indicated by others Loss Factors:  Legal issues, Decline in physical health, Financial problems / change in socioeconomic status Historical Factors:  Prior suicide attempts, Impulsivity Risk Reduction Factors:  NA  Total Time spent with patient: 45 minutes Principal Problem: Substance induced mood disorder (HCC) Diagnosis:  Principal Problem:   Substance induced mood disorder (HCC)  Subjective Data: Patient presents for suicidal ideation in the context of polysubstance abuse and homelessness.  Continued Clinical Symptoms:  Alcohol Use Disorder Identification Test Final Score (AUDIT): 35 The Alcohol Use Disorders Identification Test, Guidelines for Use in Primary Care, Second Edition.  World Science Writer [facility]). Score between 0-7:  no or low risk or alcohol related problems. Score between 8-15:  moderate risk of alcohol related problems. Score between 16-19:  high risk of alcohol related problems. Score 20 or above:  warrants further diagnostic evaluation for alcohol dependence and treatment.   CLINICAL FACTORS:   Depression:   Anhedonia Comorbid alcohol abuse/dependence Hopelessness Impulsivity Insomnia Severe Alcohol/Substance Abuse/Dependencies   Musculoskeletal: Strength & Muscle Tone: within normal limits Gait & Station: normal Patient leans: N/A  Psychiatric Specialty Exam:  Presentation  General Appearance:  Fairly Groomed  Eye Contact: Good  Speech: Slow  Speech Volume: Decreased  Handedness: Right   Mood and Affect  Mood: Depressed; Anxious  Affect: Restricted; Congruent   Thought Process  Thought Processes: Linear  Descriptions of Associations:Intact  Orientation:Full (Time, Place and Person)  Thought Content:Logical  History of  Schizophrenia/Schizoaffective disorder:No  Duration of Psychotic Symptoms:N/A  Hallucinations: Audit[place]  Ideas of Reference:None  Suicidal Thoughts:Suicidal Thoughts: No SI Active Intent and/or Plan: Without Plan  Homicidal Thoughts:Homicidal Thoughts: No   Sensorium  Memory: Immediate Good; Recent Good  Judgment: Fair  Insight: Good   Executive Functions  Concentration: Fair  Attention Span: Good  Recall: Good  Fund of Knowledge: Good  Language: Good   Psychomotor Activity  Psychomotor Activity: Psychomotor Activity: Restlessness   Assets  Assets: Communication Skills; Desire for Improvement; Housing   Sleep  Sleep: Sleep: Poor Number of Hours of Sleep: 8    Physical Exam: Physical Exam ROS Blood pressure 106/80, pulse (!) 115, temperature 97.9 F (36.6 C), temperature source Oral, resp. rate 17, height 5' 9.02 (1.753 m), weight 80.7 kg, SpO2 98%. Body mass index is 26.27 kg/m.   COGNITIVE FEATURES THAT CONTRIBUTE TO RISK:  Closed-mindedness    SUICIDE RISK:   Moderate:  Frequent suicidal ideation with limited intensity, and duration, some specificity in terms of plans, no associated intent, good self-control, limited dysphoria/symptomatology, some risk factors present, and identifiable protective factors, including available and accessible social support.  PLAN OF CARE: See history and physical  I certify that inpatient services furnished can reasonably be expected to improve the patient's condition.   Starleen GORMAN Kitty, MD 02/04/2024, 6:36 PM

## 2024-02-05 DIAGNOSIS — F1994 Other psychoactive substance use, unspecified with psychoactive substance-induced mood disorder: Secondary | ICD-10-CM

## 2024-02-05 LAB — HEMOGLOBIN A1C
Hgb A1c MFr Bld: 5.1 % (ref 4.8–5.6)
Mean Plasma Glucose: 99.67 mg/dL

## 2024-02-05 LAB — VITAMIN D 25 HYDROXY (VIT D DEFICIENCY, FRACTURES): Vit D, 25-Hydroxy: 28.74 ng/mL — ABNORMAL LOW (ref 30–100)

## 2024-02-05 LAB — VITAMIN B12: Vitamin B-12: 517 pg/mL (ref 180–914)

## 2024-02-05 LAB — LIPID PANEL
Cholesterol: 154 mg/dL (ref 0–200)
HDL: 30 mg/dL — ABNORMAL LOW (ref >40)
LDL Cholesterol: 80 mg/dL (ref 0–99)
Total CHOL/HDL Ratio: 5.1 ratio
Triglycerides: 220 mg/dL — ABNORMAL HIGH (ref <150)
VLDL: 44 mg/dL — ABNORMAL HIGH (ref 0–40)

## 2024-02-05 LAB — TSH: TSH: 1.11 u[IU]/mL (ref 0.350–4.500)

## 2024-02-05 LAB — FOLATE: Folate: 12.5 ng/mL (ref >5.9)

## 2024-02-05 MED ORDER — NALTREXONE HCL 50 MG PO TABS
50.0000 mg | ORAL_TABLET | Freq: Every day | ORAL | Status: DC
  Administered 2024-02-06 – 2024-02-08 (×3): 50 mg via ORAL
  Filled 2024-02-05 (×3): qty 1
  Filled 2024-02-05: qty 14

## 2024-02-05 MED ORDER — HALOPERIDOL DECANOATE 100 MG/ML IM SOLN
100.0000 mg | Freq: Once | INTRAMUSCULAR | Status: AC
  Administered 2024-02-05: 100 mg via INTRAMUSCULAR
  Filled 2024-02-05: qty 1

## 2024-02-05 NOTE — Plan of Care (Signed)
   Problem: Education: Goal: Knowledge of [facility] Education information/materials will improve Outcome: Progressing Goal: Emotional status will improve Outcome: Progressing Goal: Mental status will improve Outcome: Progressing Goal: Verbalization of understanding the information provided will improve Outcome: Progressing   Problem: Activity: Goal: Interest or engagement in activities will improve Outcome: Progressing

## 2024-02-05 NOTE — Group Note (Signed)
 Recreation Therapy Group Note   Group Topic:Leisure Education  Group Date: 02/05/2024 Start Time: 1045 End Time: 1115 Facilitators: Cleo Villamizar-McCall, LRT,CTRS Location: 500 [address] Dayroom   Group Topic: Leisure Education  Goal Area(s) Addresses:  Patient will identify positive leisure activities for use post discharge. Patient will identify at least one positive benefit of participation in leisure activities.  Patient will work effectively with peers to reach end goal of activity.   Behavioral Response:    Intervention: Cooperative Play   Activity: In a circle, patients were seated and had to hit a beach ball to each other as if playing volleyball. LRT would time the group to see how long they could keep the ball moving without it coming to a complete stop. If the ball came to a stop, LRT would start the time over. Patients were to remain seated and could only get up if the ball went out of the circle.  Education:  Leisure Scientist, Physiological, Special Educational Needs Teacher, Teamwork, Discharge Planning  Education Outcome: Acknowledges education/In group clarification offered/Needs additional education.    Affect/Mood: N/A   Participation Level: Did not attend    Clinical Observations/Individualized Feedback:     Plan: Continue to engage patient in RT group sessions 2-3x/week.   Arijana Narayan-McCall, LRT,CTRS 02/05/2024 1:24 PM

## 2024-02-05 NOTE — Plan of Care (Signed)
  Problem: Education: Goal: Emotional status will improve Outcome: Progressing Goal: Verbalization of understanding the information provided will improve Outcome: Progressing   Problem: Activity: Goal: Interest or engagement in activities will improve Outcome: Progressing   Problem: Coping: Goal: Ability to verbalize frustrations and anger appropriately will improve Outcome: Progressing   Problem: Health Behavior/Discharge Planning: Goal: Identification of resources available to assist in meeting health care needs will improve Outcome: Progressing

## 2024-02-05 NOTE — BHH Group Notes (Signed)
 Adult Psychoeducational Group Note  Date:  02/05/2024 Time:  11:16 AM  Group Topic/Focus: Recreation Therapy Self Care:   The focus of this group is to help patients understand the importance of self-care in order to improve or restore emotional, physical, spiritual, interpersonal, and financial health.  Participation Level:  Did Not Attend   Jerry [name] 02/05/2024, 11:16 AM

## 2024-02-05 NOTE — [facility] Group Notes (Signed)
 Adult Psychoeducational Group Note  Date:  02/05/2024 Time:  1:27 PM  Group Topic/FocusPharmacy Group:  Diagnosis Education:   The focus of this group is to discuss the major disorders that patients maybe diagnosed with.  Group discusses the importance of knowing what one's diagnosis is so that one can understand treatment and better advocate for oneself.  Participation Level:  Active  Participation Quality:  Appropriate  Affect:  Appropriate  Cognitive:  Appropriate  Insight: Appropriate  Engagement in Group:  Engaged  Modes of Intervention:  Discussion  Additional Comments:  The patient engaged in group discussion.  Alliana Mcauliff Lee 02/05/2024, 1:27 PM

## 2024-02-05 NOTE — Progress Notes (Signed)
   02/05/24 0900  Psych Admission Type (Psych Patients Only)  Admission Status Involuntary  Psychosocial Assessment  Patient Complaints Anxiety  Eye Contact Fair  Facial Expression Anxious  Affect Anxious  Speech Logical/coherent  Interaction Cautious  Motor Activity Slow;Tremors  Appearance/Hygiene Poor hygiene;Disheveled  Behavior Characteristics Cooperative  Mood Preoccupied  Thought Process  Coherency Circumstantial  Content WDL  Delusions Paranoid  Perception Hallucinations  Hallucination Auditory  Judgment Impaired  Confusion None  Danger to Self  Current suicidal ideation? Denies  Agreement Not to Harm Self Yes  Description of Agreement verbal  Danger to Others  Danger to Others None reported or observed

## 2024-02-05 NOTE — Progress Notes (Signed)
 [facility] MD Progress Note  02/05/2024 11:39 AM Jerry [name]  MRN:  [id number] Principal Problem: Substance induced mood disorder (HCC) Diagnosis: Principal Problem:   Substance induced mood disorder (HCC)   ID & Admission Information: Jerry [name] is an 48 y.o. male who  has a past medical history of Anxiety, Bipolar 1 disorder (HCC), Depression, Drug-induced seizure (HCC), EtOH dependence (HCC), Hallucination, Paranoid schizophrenia (HCC), and PTSD (post-traumatic stress disorder).  He presented on 02/03/2024 11:27 PM for Substance induced mood disorder (HCC).  Chief complaints were suicidal ideation and auditory hallucinations in the context of polysubstance abuse and homelessness.    Subjective:   Case was discussed in the multidisciplinary team. MAR was reviewed and patient was compliant with medications.   Psychiatric Team made the following recommendations yesterday: Started Haldol  5 mg twice daily and naltrexone 25 mg daily  Today on interview, pt reports improvement in auditory hallucinations, but states that today there whispers and still very distressing.  He continues to report severe depression as well as high anxiety.  He continues to endorse subjective symptoms of alcohol withdrawal, CIWA was 3 today on my exam.  He reports that he slept well last night.  He denies visual hallucinations, paranoia, or homicidal ideations.  He continues to endorse passive suicidal ideations.  He denies medication side effects.  We discussed the benefits of starting Haldol  decanoate injection, and the patient is amenable to starting at this time.  Will continue oral medication for 7 to 14 days.   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    Family History(Medical and Psychiatric):  Family History  Problem Relation Age of Onset   Heart disease Mother    Cancer Mother    Lung cancer Mother    Heart disease Father    Diabetes Father    Diabetes Maternal Grandmother    Stroke Paternal Grandfather    Social History:  Social History   Substance and Sexual Activity  Alcohol Use Yes   Comment: Decreased from 12 cans of beer and 20 shots of liquor about 2 months ago     Social History   Substance and Sexual Activity  Drug Use Yes   Types: Cocaine, Heroin, Amphetamines, Methamphetamines, Marijuana   Comment: last drug use a few days ago    Social History   Socioeconomic History   Marital status: Married    Spouse name: Not on file   Number of children: Not on file   Years of education: Not on file   Highest education level: Not on file  Occupational History   Not on file  Tobacco Use   Smoking status: Every Day    Current packs/day: 1.00    Types: Cigarettes   Smokeless tobacco: Never  Vaping Use   Vaping status: Never Used  Substance and Sexual Activity   Alcohol use: Yes    Comment: Decreased from 12 cans of beer and 20 shots of liquor about 2 months ago   Drug use: Yes    Types: Cocaine, Heroin, Amphetamines, Methamphetamines, Marijuana    Comment: last  drug use a few days ago   Sexual activity: Yes  Other Topics Concern   Not on file  Social History Narrative   Lives alone currently   Social Drivers of Health   Financial Resource Strain: Not on file  Food Insecurity: Food Insecurity Present (02/03/2024)   Hunger Vital Sign    Worried About Running Out of Food in the Last Year: Often true    Ran Out of Food in the Last Year: Often true  Transportation Needs: Unmet Transportation Needs (02/03/2024)   PRAPARE - Administrator, Civil Service (Medical): Yes    Lack of Transportation  (Non-Medical): Yes  Physical Activity: Not on file  Stress: Not on file  Social Connections: Socially Isolated (07/01/2023)   Social Connection and Isolation Panel    Frequency of Communication with Friends and Family: Never    Frequency of Social Gatherings with Friends and Family: Never    Attends Religious Services: Never    Database Administrator or Organizations: No    Attends Engineer, Structural: Never    Marital Status: Separated        Current Medications: Current Facility-Administered Medications  Medication Dose Route Frequency Provider Last Rate Last Admin   acetaminophen  (TYLENOL ) tablet 650 mg  650 mg Oral Q6H PRN Hobson, Fran E, NP       alum & mag hydroxide-simeth (MAALOX/MYLANTA) 200-200-20 MG/5ML suspension 30 mL  30 mL Oral Q4H PRN Hobson, Fran E, NP       haloperidol  (HALDOL ) tablet 5 mg  5 mg Oral TID PRN Hobson, Fran E, NP       And   diphenhydrAMINE  (BENADRYL ) capsule 50 mg  50 mg Oral TID PRN Hobson, Fran E, NP       haloperidol  lactate (HALDOL ) injection 5 mg  5 mg Intramuscular TID PRN Hobson, Fran E, NP       And   diphenhydrAMINE  (BENADRYL ) injection 50 mg  50 mg Intramuscular TID PRN Hobson, Fran E, NP       And   LORazepam  (ATIVAN ) injection 2 mg  2 mg Intramuscular TID PRN Hobson, Fran E, NP       haloperidol  lactate (HALDOL ) injection 10 mg  10 mg Intramuscular TID PRN Hobson, Fran E, NP       And   diphenhydrAMINE  (BENADRYL ) injection 50 mg  50 mg Intramuscular TID PRN Hobson, Fran E, NP       And   LORazepam  (ATIVAN ) injection 2 mg  2 mg Intramuscular TID PRN Hobson, Fran E, NP       haloperidol  (HALDOL ) tablet 5 mg  5 mg Oral BID Ulus Hazen S, MD   5 mg at 02/05/24 9346   haloperidol  decanoate (HALDOL  DECANOATE) 100 MG/ML injection 100 mg  100 mg Intramuscular Once Kennyth Starleen RAMAN, MD       hydrOXYzine  (ATARAX ) tablet 50 mg  50 mg Oral Q6H PRN Kennyth Starleen RAMAN, MD       LORazepam  (ATIVAN ) tablet 1-4 mg  1-4 mg Oral Q1H PRN Tajia Szeliga,  Ericah Scotto S, MD   1 mg at 02/04/24 1700   magnesium  hydroxide (MILK OF MAGNESIA) suspension 30 mL  30 mL Oral Daily PRN Hobson, Fran E, NP       multivitamin with minerals tablet 1 tablet  1 tablet Oral Daily Januel Doolan S, MD   1 tablet at 02/05/24 0810   [START ON 02/06/2024] naltrexone (DEPADE) tablet 50 mg  50 mg Oral  Daily Eriel Doyon S, MD       nicotine  (NICODERM CQ  - dosed in mg/24 hours) patch 14 mg  14 mg Transdermal Daily Bouchard, Marc A, DO        Lab Results:  No results found for this or any previous visit (from the past 48 hours).  Blood Alcohol level:  Lab Results  Component Value Date   [facility] <15 02/03/2024   ETH <15 01/13/2024    Metabolic Disorder Labs: Lab Results  Component Value Date   HGBA1C 4.8 07/19/2023   MPG 91.06 07/19/2023   MPG 105.41 07/09/2022   No results found for: PROLACTIN Lab Results  Component Value Date   CHOL 168 07/19/2023   TRIG 120 07/19/2023   HDL 42 07/19/2023   CHOLHDL 4.0 07/19/2023   VLDL 24 07/19/2023   LDLCALC 102 (H) 07/19/2023   LDLCALC 78 07/09/2022    Physical Findings: AIMS:  , ,  ,  ,    CIWA:  CIWA-Ar Total: 7 COWS:     Psychiatric Specialty Exam:  Presentation  General Appearance: Disheveled  Eye Contact: Fair  Speech: Slurred  Speech Volume: Decreased  Handedness: Right   Mood and Affect  Mood: Dysphoric; Hopeless; Worthless; Depressed; Anxious  Affect: Restricted; Depressed   Thought Process  Thought Processes: Linear; Coherent  Descriptions of Associations: Intact  Orientation: Partial  Thought Content: Logical  History of Schizophrenia/Schizoaffective disorder: Yes  Duration of Psychotic Symptoms: NA Hallucinations: Hallucinations: Auditory Description of Auditory Hallucinations: whispers  Ideas of Reference: None  Suicidal Thoughts: Suicidal Thoughts: No SI Active Intent and/or Plan: Without Plan  Homicidal Thoughts: Homicidal Thoughts: No   Sensorium  Memory: Immediate  Good  Judgment: Poor  Insight: Fair   Chartered Certified Accountant: Fair  Attention Span: Fair  Recall: Good  Fund of Knowledge: Good  Language: Good   Psychomotor Activity  Psychomotor Activity: Psychomotor Activity: Normal   Assets  Assets: Communication Skills; Desire for Improvement   Sleep  Sleep: Sleep: Fair   Musculoskeletal: Strength & Muscle Tone: within normal limits Gait & Station: normal Patient leans: N/A   Physical Exam: General: Sitting comfortably. NAD. HEENT: Normocephalic, atraumatic, MMM, EMOI Lungs: no increased work of breathing noted Heart: no cyanosis Abdomen: Non distended Musculoskeletal: FROM. No obvious deformities Skin: Warm, dry, intact. No rashes noted Neuro: No obvious focal deficits.  Gait and station are normal  Review of Systems  Constitutional: Negative.   HENT: Negative.    Eyes: Negative.   Respiratory: Negative.    Cardiovascular: Negative.   Gastrointestinal: Negative.   Genitourinary: Negative.   Skin: Negative.   Neurological: Negative.   Psychiatric/Behavioral:  Positive for auditory hallucinations, depression, anxiety, alcohol withdrawal.     Blood pressure 110/79, pulse 97, temperature 98.5 F (36.9 C), temperature source Oral, resp. rate 17, height 5' 9.02 (1.753 m), weight 80.7 kg, SpO2 96%. Body mass index is 26.27 kg/m.  ASSESSMENT: Jerry [name] is an 48 y.o. male who  has a past medical history of Anxiety, Bipolar 1 disorder (HCC), Depression, Drug-induced seizure (HCC), EtOH dependence (HCC), Hallucination, Paranoid schizophrenia (HCC), and PTSD (post-traumatic stress disorder).  He presented on 02/03/2024 11:27 PM for Substance induced mood disorder (HCC).  He complained of auditory hallucinations and suicidal ideation.  Auditory hallucinations and alcohol cravings appear to be improving with Haldol  and naltrexone.  Diagnoses / Active Problems: Patient Active Problem List   Diagnosis Date  Noted   Stimulant use disorder 01/14/2024   Alcohol use  disorder 09/07/2023   Cocaine use disorder, moderate, dependence (HCC) 09/04/2023   Alcohol use disorder, moderate, dependence (HCC) 09/04/2023   Protein-calorie malnutrition, severe 07/03/2023   Left arm cellulitis 07/01/2023   Jaw pain 11/29/2022   Need for influenza vaccination 11/29/2022   Erectile dysfunction 11/29/2022   Hyponatremia 11/29/2022   History of hepatitis C virus infection 11/29/2022   Tardive dyskinesia 10/24/2022   Generalized anxiety disorder 10/24/2022   PTSD (post-traumatic stress disorder) 10/24/2022   Healthcare maintenance 10/24/2022   Alcohol withdrawal (HCC) 07/22/2022   Schizoaffective disorder, bipolar type (HCC) 07/09/2022   Schizophrenia (HCC) 07/07/2022   Auditory hallucination    Malingering 09/07/2020   Homelessness 09/07/2020   Substance induced mood disorder (HCC) 09/07/2020   Major depressive disorder, recurrent severe without psychotic features (HCC) 07/17/2020   Schizophrenia spectrum disorder with psychotic disorder type not yet determined (HCC) 07/17/2020   Methamphetamine abuse (HCC) 07/17/2020   Marijuana abuse 07/17/2020   Cocaine abuse (HCC) 07/17/2020   Anxiety and depression 06/25/2014   Family history of diabetes mellitus (DM) 06/25/2014   Family history of thyroid  disease 06/25/2014   Tobacco use disorder 06/25/2014   Alcohol abuse 05/18/2013   Polysubstance abuse (HCC) 05/18/2013   Depression, unspecified 05/18/2013      PLAN: Safety and Monitoring:  -- Voluntary admission to inpatient psychiatric unit for safety, stabilization and treatment  -- Daily contact with patient to assess and evaluate symptoms and progress in treatment  -- Patient's case to be discussed in multi-disciplinary team meeting  -- Observation Level : q15 minute checks  -- Vital signs:  q12 hours  -- Precautions: suicide, elopement, and assault  2. Psychiatric Diagnoses and Treatment:  Patient  Active Problem List   Diagnosis Date Noted   Stimulant use disorder 01/14/2024   Alcohol use disorder 09/07/2023   Cocaine use disorder, moderate, dependence (HCC) 09/04/2023   Alcohol use disorder, moderate, dependence (HCC) 09/04/2023   Protein-calorie malnutrition, severe 07/03/2023   Left arm cellulitis 07/01/2023   Jaw pain 11/29/2022   Need for influenza vaccination 11/29/2022   Erectile dysfunction 11/29/2022   Hyponatremia 11/29/2022   History of hepatitis C virus infection 11/29/2022   Tardive dyskinesia 10/24/2022   Generalized anxiety disorder 10/24/2022   PTSD (post-traumatic stress disorder) 10/24/2022   Healthcare maintenance 10/24/2022   Alcohol withdrawal (HCC) 07/22/2022   Schizoaffective disorder, bipolar type (HCC) 07/09/2022   Schizophrenia (HCC) 07/07/2022   Auditory hallucination    Malingering 09/07/2020   Homelessness 09/07/2020   Substance induced mood disorder (HCC) 09/07/2020   Major depressive disorder, recurrent severe without psychotic features (HCC) 07/17/2020   Schizophrenia spectrum disorder with psychotic disorder type not yet determined (HCC) 07/17/2020   Methamphetamine abuse (HCC) 07/17/2020   Marijuana abuse 07/17/2020   Cocaine abuse (HCC) 07/17/2020   Anxiety and depression 06/25/2014   Family history of diabetes mellitus (DM) 06/25/2014   Family history of thyroid  disease 06/25/2014   Tobacco use disorder 06/25/2014   Alcohol abuse 05/18/2013   Polysubstance abuse (HCC) 05/18/2013   Depression, unspecified 05/18/2013     Scheduled Medications:  haloperidol   5 mg Oral BID   haloperidol  decanoate  100 mg Intramuscular Once   multivitamin with minerals  1 tablet Oral Daily   [START ON 02/06/2024] naltrexone  50 mg Oral Daily   nicotine   14 mg Transdermal Daily    As Needed Medications: acetaminophen , alum & mag hydroxide-simeth, haloperidol  **AND** diphenhydrAMINE , haloperidol  lactate **AND** diphenhydrAMINE  **AND** LORazepam ,  haloperidol  lactate **AND** diphenhydrAMINE  **AND**  LORazepam , hydrOXYzine , LORazepam , magnesium  hydroxide    3. Medical Issues Being Addressed:   -- None  Labs reviewed, mostly unremarkable   Tobacco Use Disorder  -- Nicotine  replacement as above  -- Smoking cessation encouraged  4. Discharge Planning:   -- Social work and case management to assist with discharge planning and identification of hospital follow-up needs prior to discharge  -- Estimated LOS: Likely discharge by Friday  -- Discharge Concerns: Need to establish a safety plan; Medication compliance and effectiveness  -- Discharge Goals: Return home with outpatient referrals for mental health follow-up including medication management/psychotherapy  5. Short Term Goals:  Improve ability to identify changes in lifestyle to reduce recurrence of condition, verbalize feelings, disclose and discuss suicidal ideas, demonstrate self-control, identify and develop effective coping behaviors, compliance with prescribed medications, identify triggers associated with substance abuse/mental health issues, participate in unit milieu and in scheduled group therapies   6. Long Term Goals: Improvement in symptoms so the patient is ready for discharge   --The risks/benefits/side-effects/alternatives to the medications above were discussed in detail with the patient and time was given for questions. The patient provided informed consent.   -- Metabolic profile and EKG monitoring obtained while on an atypical antipsychotic and listed in the EHR    Total Time Spent in Direct Patient Care:  I personally spent 35 minutes on the unit in direct patient care. The direct patient care time included face-to-face time with the patient, reviewing the patient's chart, communicating with other professionals, and coordinating care. Greater than 50% of this time was spent in counseling or coordinating care with the patient regarding goals of hospitalization,  psycho-education, and discharge planning needs.      Glendia Kitty, MD Psychiatrist  02/05/2024, 11:39 AM   I certify that inpatient services furnished can reasonably be expected to improve the patient's condition.    Portions of this note were created using voice recognition software. Minor syntax errors, grammatical content, spelling, or punctuation errors may have occurred unintentionally. Please notify the dino if the meaning of any statement is unclear.

## 2024-02-05 NOTE — Progress Notes (Signed)
 Pt having AH in the cafeteria , had to be brought back early. Pt given his 0800 dose Haldol , will continue to monitor

## 2024-02-05 NOTE — Progress Notes (Addendum)
 Collateral contact   [facility] [address] [place], [place] [address] [phone]  A referral was faxed. CSW was informed that the referral was received and the clinician is reviewing it.   [facility] [address] [place], [place] [address] [phone]  A referral was faxed. CSW was advised that patient needs to call at 10:15 AM.   Conversation with patient:  CSW gave patient a list of substance use resources - large print.  Patient said he is able to see it.     Acelin Ferdig, LCSWA 02/05/2024

## 2024-02-05 NOTE — Group Note (Signed)
 Date:  02/05/2024 Time:  8:50 PM  Group Topic/Focus:  Wrap-Up Group:   The focus of this group is to help patients review their daily goal of treatment and discuss progress on daily workbooks.    Participation Level:  Did Not Attend  Tamar Miano Dacosta 02/05/2024, 8:50 PM

## 2024-02-05 NOTE — Group Note (Deleted)
Date

## 2024-02-05 NOTE — [facility] Group Notes (Signed)
 Adult Psychoeducational Group Note  Date:  02/05/2024 Time:  11:02 AM  Group Topic/FocusOrentation Group:  Orientation:   The focus of this group is to educate the patient on the purpose and policies of crisis stabilization and provide a format to answer questions about their admission.  The group details unit policies and expectations of patients while admitted.  Participation Level:  Did Not Attend  Participation Quality:    Affect:    Cognitive:   Insight:   Engagement in Group:    Modes of Intervention:    Additional Comments:    Mirl Hillery Lee 02/05/2024, 11:02 AM

## 2024-02-06 DIAGNOSIS — F1310 Sedative, hypnotic or anxiolytic abuse, uncomplicated: Secondary | ICD-10-CM | POA: Diagnosis present

## 2024-02-06 MED ORDER — VITAMIN D 25 MCG (1000 UNIT) PO TABS
1000.0000 [IU] | ORAL_TABLET | Freq: Every day | ORAL | Status: DC
  Administered 2024-02-06 – 2024-02-08 (×3): 1000 [IU] via ORAL
  Filled 2024-02-06: qty 1
  Filled 2024-02-06: qty 14
  Filled 2024-02-06 (×2): qty 1

## 2024-02-06 NOTE — Progress Notes (Addendum)
 Collateral contact  [facility] [address] [place], [place] [address] [phone]  CSW was informed that patient has been approved.  Admission date and time:  Friday, 02/08/2024 at 9 AM.  Patient needs a 30-day medication supply, no smoking or vaping, no more than 7-days of clothing.   Coyt Govoni, LCSWA 02/06/2024

## 2024-02-06 NOTE — [facility] Group Notes (Signed)
 Adult Psychoeducational Group Note  Date:  02/06/2024 Time:  11:27 AM  Group Topic/Focus:  Goals Group:   The focus of this group is to help patients establish daily goals to achieve during treatment and discuss how the patient can incorporate goal setting into their daily lives to aide in recovery. Orientation:   The focus of this group is to educate the patient on the purpose and policies of crisis stabilization and provide a format to answer questions about their admission.  The group details unit policies and expectations of patients while admitted.  Participation Level:  Did Not Attend  Participation Quality:    Affect:    Cognitive:    Insight:   Engagement in Group:    Modes of Intervention:    Additional Comments:    Jamelle Cassondra KIDD 02/06/2024, 11:27 AM

## 2024-02-06 NOTE — Plan of Care (Signed)
   Problem: Education: Goal: Emotional status will improve Outcome: Progressing Goal: Mental status will improve Outcome: Progressing   Problem: Activity: Goal: Sleeping patterns will improve Outcome: Progressing

## 2024-02-06 NOTE — Progress Notes (Addendum)
 [facility] Inpatient Psychiatry Progress Note  Date: 02/06/24 Patient: Jerry [name] MRN: [id number]  Assessment and Plan: Jerry [name] is a 48 y.o. male with a past psychiatric history of schizophrenia, PTSD, substance-induced mood disorder and polysubstance abuse.  He presents to our service for hallucinations both auditory and visual as well as unstable mood.  The patient's presentation is notable for urine drug screen positive for benzodiazepines, amphetamines, cocaine, THC and amphetamines.  He is homeless and medication noncompliant complicates his clinical picture.  Follow-up on 02/06/2024: - Labs were reviewed and patient has notable elevated lipids.  CMP has elevated LFTs consistent with history of hepatitis C.  UDS positive for benzodiazepines, cocaine, methamphetamines, THC, amphetamines.  Remaining labs were unremarkable.  I was informed by social work that he has been accepted for a 30-day inpatient rehab at [facility].  I discussed with the patient and he is now reluctant to attend this.  He states that he cannot smoke there and wants to be able to see his family.  However, the patient is willing to rethink this decision and will inform me tomorrow of his intentions.  Auditory hallucinations continue to be complained of though they are not disturbing to him and he did not appear distracted by internal stimuli.  Behavior was calm and thought process was linear and goal-directed.  Tolerating his medication regimen well.  # Schizophrenia (HCC) - Continue Haldol  5 mg twice daily for psychosis and hallucinations - Start vitamin D  tablet due to low vitamin D   Labs - Reviewed labs notable labs below - Hemoglobin A1c 5.1 - Lipid panel elevated -Vitamin D  low -Vitamin B12 within normal limits -TSH 1.1 -CMP total for elevated LFTs likely consistent with hepatitis C -CBC unremarkable -EKG on 01/13/2024: QTc 474  # Polysubstance abuse (cocaine, marijuana, alcohol,  methamphetamines, benzodiazepines) - Group therapy - CIWA protocol - Referral to [facility] for inpatient substance abuse rehab  # Substance-induced mood disorder - Group therapy - CIWA protocol - Continue Haldol  5 mg twice daily for mood stabilization - Referral to [facility] for inpatient substance abuse rehab  Risk Assessment -low  Discharge Planning Barriers to discharge: Housing, detox Estimated length of stay: Patient was accepted to [facility] on 02/08/2024 for inpatient rehab Predicted Discharge location: [facility]     Interval History and update:   Labs reviewed: UDS positive for polysubstances, CMP notable for elevated LFTs.  Vitamin D  is low.  TSH, B12 are within normal limits.  A1c is normal as was CBC.  Lipids mildly elevated. Chart reviewed no events overnight and vital signs stable, CIWA score 5  On my assessment the patient was calm and friendly, interacted with me appropriately.  Thoughts were linear and goal-directed though concrete.  No overt evidence of psychosis or mania.  However, patient continues to complain of low level auditory hallucinations.  These are not commanding and he is not bothered by them.  He denies visual hallucinations and there is no delusional content.  Denies SI, HI.  Endorses auditory hallucinations.  Denies visual hallucinations.  Patient reports tolerating his medication regimen well with no observed reported side effects.  I discussed his acceptance to [facility] for a 30-day inpatient rehab.  Patient is initially reluctant to attend, stating that he cannot smoke there and it is intrusive to his life.  I encouraged the patient to reassess this opportunity as it may prevent relapse of drugs.  He will think about this tonight.   Review of systems - Denies nausea, vomiting,  diarrhea, constipation, chest pain, shortness of breath, abdominal pain and EPS   Physical Exam MSK/Neuro - Normal gait and station  Mental Status Exam Appearance -in scrubs, appropriate  hygiene Attitude - Calm, polite, not guarded Speech -low volume, speech is difficult to understand Mood -good Affect -distracted Thought Process -linear goal-directed though concrete Thought Content - No delusional content expressed SI/HI - Denies  Perceptions -endorses auditory hallucinations though states they are improved and not bothering him Judgement/Insight -fair Fund of knowledge -fair Language -mumbles      Lab Results:  Admission on 02/03/2024  Component Date Value Ref Range Status   Folate 02/05/2024 12.5  >5.9 ng/mL Final   Hgb A1c MFr Bld 02/05/2024 5.1  4.8 - 5.6 % Final   Mean Plasma Glucose 02/05/2024 99.67  mg/dL Final   Cholesterol 88/81/7974 154  0 - 200 mg/dL Final   Triglycerides 88/81/7974 220 (H)  <150 mg/dL Final   HDL 88/81/7974 30 (L)  >40 mg/dL Final   Total CHOL/HDL Ratio 02/05/2024 5.1  RATIO Final   VLDL 02/05/2024 44 (H)  0 - 40 mg/dL Final   LDL Cholesterol 02/05/2024 80  0 - 99 mg/dL Final   TSH 88/81/7974 1.110  0.350 - 4.500 uIU/mL Final   Vitamin B-12 02/05/2024 517  180 - 914 pg/mL Final   Vit D, 25-Hydroxy 02/05/2024 28.74 (L)  30 - 100 ng/mL Final     Vitals: Blood pressure 104/71, pulse 94, temperature 98.2 F (36.8 C), temperature source Oral, resp. rate 17, height 5' 9.02 (1.753 m), weight 80.7 kg, SpO2 98%.    Lamar Handler Jama Slain, DO

## 2024-02-06 NOTE — Progress Notes (Signed)
(  Sleep Hours) - 8 (Any PRNs that were needed, meds refused, or side effects to meds)- Ativan  1 mg, hydroxyzine  50 mg (Any disturbances and when (visitation, over night)- None reported (Concerns raised by the patient)- No concerns (SI/HI/AVH)- Inaudible auditory hallucinations, non commanding  Pt observed in bed.  Calm and cooperative during assessment

## 2024-02-06 NOTE — Progress Notes (Signed)
 Patient stated they slept Good last night. Patient denies SI, HI, and VH but states they are hearing whispers. Scored 6/10 on anxiety and 5/10 feeling of hopelessness, and 6/10 on depression. Patient goal is Recovery and they will do some Calling to help reach their goal. Patient has been calm, cooperative, and med compliant.      02/06/24 0900  Psych Admission Type (Psych Patients Only)  Admission Status Involuntary  Psychosocial Assessment  Patient Complaints Anxiety;Depression  Eye Contact Fair  Facial Expression Anxious  Affect Anxious  Speech Logical/coherent  Interaction Cautious  Motor Activity Slow;Tremors  Appearance/Hygiene Disheveled  Behavior Characteristics Cooperative  Mood Preoccupied;Anxious  Thought Process  Coherency Circumstantial  Content WDL  Delusions Paranoid  Perception Hallucinations  Hallucination Auditory  Judgment Impaired  Confusion None  Danger to Self  Current suicidal ideation? Denies  Agreement Not to Harm Self Yes  Description of Agreement Verbal  Danger to Others  Danger to Others None reported or observed

## 2024-02-06 NOTE — Plan of Care (Signed)
   Problem: Education: Goal: Knowledge of [facility] Education information/materials will improve Outcome: Progressing Goal: Emotional status will improve Outcome: Progressing Goal: Mental status will improve Outcome: Progressing Goal: Verbalization of understanding the information provided will improve Outcome: Progressing

## 2024-02-06 NOTE — BHH Group Notes (Signed)
 Adult Psychoeducational Group Note  Date:  02/06/2024 Time:  8:53 PM  Group Topic/Focus:  Wrap-Up Group:   The focus of this group is to help patients review their daily goal of treatment and discuss progress on daily workbooks.  Participation Level:  Did Not Attend  Jerry [name] 02/06/2024, 8:53 PM

## 2024-02-06 NOTE — Progress Notes (Signed)
 Recreation Therapy Notes  INPATIENT RECREATION THERAPY ASSESSMENT  Patient Details Name: Jerry [name] MRN: [id number] DOB: 1976/02/10 Today's Date: 02/06/2024       Information Obtained From: Patient  Able to Participate in Assessment/Interview: Yes (Pt was hard to understand at times during interview. Pt would talk in a volume that made it hard to hear him.)  Patient Presentation: Responsive  Reason for Admission (Per Patient): Other (Comments) (tired of being tired)  Patient Stressors: Other (Comment) (everything)  Coping Skills:    (None)  Leisure Interests (2+):  Individual - Other (Comment) (watching racing)  Frequency of Recreation/Participation: Other (Comment) (when it comes on)  Awareness of Community Resources:  Yes  [employer]:  Library, [employer]  Current Use: No  If no, Barriers?: Other (Comment) (no desire to)  Expressed Interest in [employer] Information: No  [place]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

## 2024-02-06 NOTE — Group Note (Signed)
 Recreation Therapy Group Note   Group Topic:Coping Skills  Group Date: 02/06/2024 Start Time: 1045 End Time: 1108 Facilitators: Zamariyah Furukawa-McCall, LRT,CTRS Location: [address] Dayroom   Group Topic: Coping Skills   Goal Area(s) Addresses: Patient will define what a coping skill is. Patient will work with peer to create a list of healthy coping skills beginning with each letter of the alphabet. Patient will successfully identify positive coping skills they can use post d/c.  Patient will acknowledge benefit(s) of using learned coping skills post d/c.    Behavioral Response:    Intervention: Group work   Activity: Coping A to Z. Patient asked to identify what a coping skill is and when they use them. Patients with clinical research associate discussed healthy versus unhealthy coping skills. Next patients were given a blank worksheet titled Coping Skills A-Z and asked to pair up with a peer. Partners were instructed to come up with at least one positive coping skill per letter of the alphabet, addressing a specific challenge (ex: stress, anger, anxiety, depression, grief, doubt, isolation, self-harm/suicidal thoughts, substance use). Patients were given 15 minutes to brainstorm with their peer, before ideas were presented to the large group. Patients and LRT debriefed on the importance of coping skill selection based on situation and back-up plans when a skill tried is not effective. At the end of group, patients were given an handout of alphabetized strategies to keep for future reference.   Education: Pharmacologist, Scientist, Physiological, Discharge Planning.    Education Outcome: Acknowledges education/Verbalizes understanding/In group clarification offered/Additional education needed   Affect/Mood: N/A   Participation Level: Did not attend    Clinical Observations/Individualized Feedback:      Plan: Continue to engage patient in RT group sessions 2-3x/week.   Sparsh Callens-McCall,  LRT,CTRS 02/06/2024 1:45 PM

## 2024-02-06 NOTE — Progress Notes (Signed)
(  Sleep Hours) - (Any PRNs that were needed, meds refused, or side effects to meds)- none (Any disturbances and when (visitation, over night)- none (Concerns raised by the patient)- none (SI/HI/AVH)- denies

## 2024-02-07 MED ORDER — NALTREXONE HCL 50 MG PO TABS: 50.0000 mg | ORAL_TABLET | Freq: Every day | ORAL | 0 refills | Status: DC

## 2024-02-07 MED ORDER — ADULT MULTIVITAMIN W/MINERALS CH: 1.0000 | ORAL_TABLET | Freq: Every day | ORAL | 0 refills | Status: DC

## 2024-02-07 MED ORDER — HALOPERIDOL 5 MG PO TABS: 5.0000 mg | ORAL_TABLET | Freq: Two times a day (BID) | ORAL | 0 refills | Status: DC

## 2024-02-07 MED ORDER — NICOTINE 14 MG/24HR TD PT24: 14.0000 mg | MEDICATED_PATCH | Freq: Every day | TRANSDERMAL | 0 refills | Status: DC

## 2024-02-07 MED ORDER — VITAMIN D3 25 MCG PO TABS: 1000.0000 [IU] | ORAL_TABLET | Freq: Every day | ORAL | 0 refills | Status: DC

## 2024-02-07 NOTE — BHH Group Notes (Signed)
 BHH Group Notes:  (Nursing/MHT/Case Management/Adjunct)  Date:  02/07/2024  Time:  9:23 PM  Type of Therapy:  Wrap-up group  Participation Level:  Did Not Attend  Participation Quality:    Affect:    Cognitive:    Insight:    Engagement in Group:    Modes of Intervention:    Summary of Progress/Problems:  Jerry [name] 02/07/2024, 9:23 PM

## 2024-02-07 NOTE — Plan of Care (Signed)
   Problem: Education: Goal: Emotional status will improve Outcome: Progressing Goal: Mental status will improve Outcome: Progressing Goal: Verbalization of understanding the information provided will improve Outcome: Progressing

## 2024-02-07 NOTE — Progress Notes (Addendum)
 Patient was given a list of shelters for unhoused individuals and food banks.  Patient was given shoes, short-sleeve shirt, shorts and socks.   Quinesha Selinger, LCSWA 02/07/2024

## 2024-02-07 NOTE — Group Note (Signed)
 Recreation Therapy Group Note   Group Topic:Other  Group Date: 02/07/2024 Start Time: 1000 End Time: 1100 Facilitators: Aleyah Balik-McCall, LRT,CTRS Location: 500 [address] Dayroom   Group Topic/Focus: General Recreation   Goal Area(s) Addresses:  Patient will use appropriate interactions in play with peers.   Patient will follow directions on first prompt.  Behavioral Response:   Intervention: Television and Movies  Activity: Due to lack of staffing, LRT did dayroom duties. As a result, patients were allowed to watch a television or a movie so they would have access to the dayroom. LRT also made sure patients completed their daily inventory sheets.    Affect/Mood: Flat   Participation Level: Minimal   Participation Quality: Independent   Behavior: Cooperative   Speech/Thought Process: Relevant   Insight: Fair   Judgement: Fair    Modes of Intervention: General Rec   Patient Response to Interventions:  Receptive   Education Outcome:  In group clarification offered    Clinical Observations/Individualized Feedback: Pt came in dayroom and had a snack. LRT assisted pt in completing self inventory sheet because he expressed he couldn't see it. Pt proceeded to answer the first 2 or 3 questions, which LRT would check off for him, then didn't want to answer anymore questions and told LRT to write everything was good.      Plan: Continue to engage patient in RT group sessions 2-3x/week.   Emiel Kielty-McCall, LRT,CTRS 02/07/2024 12:35 PM

## 2024-02-07 NOTE — BHH Group Notes (Signed)
 Adult Psychoeducational Group Note  Date:  02/07/2024 Time:  3:55 PM  Group Topic/Focus: Occupational Therapy Making Healthy Choices:   The focus of this group is to help patients identify negative/unhealthy choices they were using prior to admission and identify positive/healthier coping strategies to replace them upon discharge.  Participation Level:  Did Not Attend  Participation Quality:    Affect:    Cognitive:   Insight:   Engagement in Group:    Modes of Intervention:    Additional Comments:    Jerry [name] 02/07/2024, 3:55 PM

## 2024-02-07 NOTE — Transportation (Signed)
 02/07/2024  Jerry [name] DOB: 19-Jul-1975 MRN: [id number]   RIDER WAIVER AND RELEASE OF LIABILITY  For the purposes of helping with transportation needs, [facility] partners with outside transportation providers (taxi companies, [place], catering manager.) to give [facility] patients or other approved people the choice of on-demand rides Public Librarian) to our buildings for non-emergency visits.  By using [employer], I, the person signing this document, on behalf of myself and/or any legal minors (in my care using the [employer]), agree:  Science Writer given to me are supplied by independent, outside transportation providers who do not work for, or have any affiliation with, [employer]. [place] is not a transportation company. [facility] has no control over the quality or safety of the rides I get using [employer]. [facility] has no control over whether any outside ride will happen on time or not. [facility] gives no guarantee on the reliability, quality, safety, or availability on any rides, or that no mistakes will happen. I know and accept that traveling by vehicle (car, truck, SVU, fleeta, bus, taxi, etc.) has risks of serious injuries such as disability, being paralyzed, and death. I know and agree the risk of using [employer] is mine alone, and not [employer]. [employer] are provided as is and as are available. The transportation providers are in charge for all inspections and care of the vehicles used to provide these rides. I agree not to take legal action against [facility], its agents, employees, officers, directors, representatives, insurers, attorneys, assigns, successors, subsidiaries, and affiliates at any time for any reasons related directly or indirectly to using [employer]. I also agree not to take legal action against [facility] or its affiliates for any injury, death, or damage to property caused by or related to using  [employer]. I have read this Waiver and Release of Liability, and I understand the terms used in it and their legal meaning. This Waiver is freely and voluntarily given with the understanding that my right (or any legal minors) to legal action against  relating to [employer] is knowingly given up to use these services.   I attest that I read the Ride Waiver and Release of Liability to Jerry [name], gave Mr. Vacha the opportunity to ask questions and answered the questions asked (if any). I affirm that Jerry [name] then provided consent for assistance with transportation.       Srijan Givan, LCSWA 02/07/2024

## 2024-02-07 NOTE — Progress Notes (Addendum)
  [facility] Adult Case Management Discharge Plan :  Will you be returning to the same living situation after discharge:  No. At discharge, do you have transportation home?: Yes,  CSW arranged transportation through [facility] for Friday, 02/08/2024 at 8 AM Do you have the ability to pay for your medications: Yes,  patient has insurance  Release of information consent forms completed and in the chart;  Patient's signature needed at discharge.  Patient to Follow up at:  Follow-up Information     Services, [facility] Follow up.   Why: Referral made Contact information: [address] [place] [address] [phone]         [facility] Go on 02/08/2024.   Specialty: Addiction Medicine Why: A referral has been made to this provider for the substance abuse residential program.  Rosine will go to this provider on 02/08/24 at 8:00 am. Contact information: [address] [place] [address] [phone]         [facility] Follow up.   Specialty: Behavioral Health Why: You may go to this provider for medication management services, Monday through Friday, arrive by 7:00 am for your initial assessment. Contact information: [address] [place]  [address] [phone]        [facility] Follow up.   Why: You may call this provider for therapy and/or medication management services. Contact information: [address]  [address] [place] [address] [phone]                 Next level of care provider has access to [facility] Link:no  Safety Planning and Suicide Prevention discussed: Yes,  with patient  Has patient been referred to the Quitline?: Patient refused referral for treatment  Patient has been referred for addiction treatment:  Yes, upon discharge, patient will go directly to [facility], [address] [place], [place] [address],  [phone].   Jim Lundin O Kalvin Buss, LCSWA 02/07/2024, 4:21 PM

## 2024-02-07 NOTE — BHH Suicide Risk Assessment (Addendum)
 BHH INPATIENT:  Family/Significant Other Suicide Prevention Education  Suicide Prevention Education:  Education Completed; with patient,  (name of family member/significant other) has been identified by the patient as the family member/significant other with whom the patient will be residing, and identified as the person(s) who will aid the patient in the event of a mental health crisis (suicidal ideations/suicide attempt).  With written consent from the patient, the family member/significant other has been provided the following suicide prevention education, prior to the and/or following the discharge of the patient.  Patient was given Suicide Prevention Information brochure.  Patient said he doesn't have access to guns or weapons, and doesn't have guns or weapons.  The suicide prevention education provided includes the following: Suicide risk factors Suicide prevention and interventions National Suicide Hotline telephone number [facility] assessment telephone number [facility] Emergency Assistance 911 [facility] and/or Residential Mobile Crisis Unit telephone number  Request made of family/significant other to: Remove weapons (e.g., guns, rifles, knives), all items previously/currently identified as safety concern.   Remove drugs/medications (over-the-counter, prescriptions, illicit drugs), all items previously/currently identified as a safety concern.  The family member/significant other verbalizes understanding of the suicide prevention education information provided.  The family member/significant other agrees to remove the items of safety concern listed above.  Jerry [name] O Jerry [name], LCSWA 02/07/2024, 4:26 PM

## 2024-02-07 NOTE — BHH Group Notes (Signed)
 Adult Psychoeducational Group Note  Date:  02/07/2024 Time:  3:16 PM  Group Topic/Focus: Nutrition Group Healthy Communication:   The focus of this group is to discuss communication, barriers to communication, as well as healthy ways to communicate with others.  Participation Level:  Did Not Attend  Participation Quality:    Affect:    Cognitive:    Insight:   Engagement in Group:    Modes of Intervention:    Additional Comments:    Jerry [name] 02/07/2024, 3:16 PM

## 2024-02-07 NOTE — BHH Group Notes (Signed)
 Adult Psychoeducational Group Note  Date:  02/07/2024 Time:  11:33 AM  Group Topic/Focus:Recreation Therapy  Wellness Toolbox:   The focus of this group is to discuss various aspects of wellness, balancing those aspects and exploring ways to increase the ability to experience wellness.  Patients will create a wellness toolbox for use upon discharge.  Participation Level:  Did Not Attend  Participation Quality:    Affect:    Cognitive:    Insight:   Engagement in Group:    Modes of Intervention:   Additional Comments:    Jerry [name] 02/07/2024, 11:33 AM

## 2024-02-07 NOTE — Progress Notes (Signed)
 [facility] Inpatient Psychiatry Progress Note  Date: 02/07/24 Patient: Jerry [name] MRN: [id number]  Assessment and Plan: Jerry [name] is a 48 y.o. male with a past psychiatric history of schizophrenia, PTSD, substance-induced mood disorder and polysubstance abuse.  He presents to our service for hallucinations both auditory and visual as well as unstable mood.  The patient's presentation is notable for urine drug screen positive for benzodiazepines, amphetamines, cocaine, THC and amphetamines.  He is homeless and medication noncompliant complicates his clinical picture.  Follow-up on 02/06/2024: - Labs were reviewed and patient has notable elevated lipids.  CMP has elevated LFTs consistent with history of hepatitis C.  UDS positive for benzodiazepines, cocaine, methamphetamines, THC, amphetamines.  Remaining labs were unremarkable.  I was informed by social work that he has been accepted for a 30-day inpatient rehab at [facility].  I discussed with the patient and he is now reluctant to attend this.  He states that he cannot smoke there and wants to be able to see his family.  However, the patient is willing to rethink this decision and will inform me tomorrow of his intentions.  Auditory hallucinations continue to be complained of though they are not disturbing to him and he did not appear distracted by internal stimuli.  Behavior was calm and thought process was linear and goal-directed.  Tolerating his medication regimen well.  Follow-up on 02/07/2024 - Stable in terms of mood, affect and behavior.  No delusional content was elicited and he did not appear to be responding to internal stimuli.  Patient reports resolution of hallucinations as well.  Able to communicate clearer and more robustly with me today.  Tolerating his medication regimen well.  Patient feels ready for discharge and now states he is looking forward to going to [facility].  Current plans to discharge to [facility] tomorrow  pending any major changes.  # Schizophrenia (HCC) - Continue Haldol  5 mg twice daily for psychosis and hallucinations - Continue multivitamin and vitamin D  tablet due to low vitamin D   Labs - Reviewed labs notable labs below - Hemoglobin A1c 5.1 - Lipid panel elevated -Vitamin D  low -Vitamin B12 within normal limits -TSH 1.1 -CMP total for elevated LFTs likely consistent with hepatitis C -CBC unremarkable -EKG on 01/13/2024: QTc 474  # Polysubstance abuse (cocaine, marijuana, alcohol, methamphetamines, benzodiazepines) - Group therapy - CIWA protocol - Referral to [facility] for inpatient substance abuse rehab - Continue naltrexone  50 mg daily  # Substance-induced mood disorder - Group therapy - CIWA protocol - Continue Haldol  5 mg twice daily for mood stabilization - Referral to [facility] for inpatient substance abuse rehab  Risk Assessment -low  Discharge Planning Barriers to discharge: Housing, detox Estimated length of stay: Patient was accepted to [facility] on 02/08/2024 for inpatient rehab Predicted Discharge location: [facility]     Interval History and update:   Chart reviewed Vital signs stable CIWA score 6 yesterday, 3 today Patient received Ativan  yesterday evening No events overnight Slept 11 hours  On my assessment today, he is more engaging and easier to understand.  He is calm, friendly and interacts adequately me.  No delusional content was elicited and he does not appear to be responding to internal stimuli.  Thought process was clear and linear though concrete in nature.  He denies SI, HI.  He reports resolution of visual hallucinations and virtually no auditory hallucinations today.  He states that Haldol  is working well and he is no longer bothered by hallucinations. Review of  systems is negative.  Patient denies any side effects from his medications and he appears to be tolerating these well with no observed or reported side effects. Sleep is strong over 11 hours.   Appetite adequate.  Not attending groups though is seen on the unit. I discussed discharge tomorrow, the patient feels ready for discharge and now looks forward to going to [facility] for rehab. He has no other questions or concerns for me today. He thanked me for the attention.   Review of systems - Denies nausea, vomiting, diarrhea, constipation, chest pain, shortness of breath, abdominal pain and EPS   Physical Exam MSK/Neuro - Normal gait and station    Mental Status Exam Appearance -in scrubs, appropriate hygiene Attitude - Calm, polite, not guarded Speech -low volume, speech is difficult to understand though improved today Mood -good Affect -restricted Thought Process -linear goal-directed though concrete Thought Content - No delusional content expressed SI/HI - Denies  Perceptions -denies both auditory and visual hallucinations today Judgement/Insight -fair Fund of knowledge -fair Language -improving      Lab Results:  Admission on 02/03/2024  Component Date Value Ref Range Status   Folate 02/05/2024 12.5  >5.9 ng/mL Final   Hgb A1c MFr Bld 02/05/2024 5.1  4.8 - 5.6 % Final   Mean Plasma Glucose 02/05/2024 99.67  mg/dL Final   Cholesterol 88/81/7974 154  0 - 200 mg/dL Final   Triglycerides 88/81/7974 220 (H)  <150 mg/dL Final   HDL 88/81/7974 30 (L)  >40 mg/dL Final   Total CHOL/HDL Ratio 02/05/2024 5.1  RATIO Final   VLDL 02/05/2024 44 (H)  0 - 40 mg/dL Final   LDL Cholesterol 02/05/2024 80  0 - 99 mg/dL Final   TSH 88/81/7974 1.110  0.350 - 4.500 uIU/mL Final   Vitamin B-12 02/05/2024 517  180 - 914 pg/mL Final   Vit D, 25-Hydroxy 02/05/2024 28.74 (L)  30 - 100 ng/mL Final     Vitals: Blood pressure 116/80, pulse 86, temperature 97.8 F (36.6 C), temperature source Oral, resp. rate 18, height 5' 9.02 (1.753 m), weight 80.7 kg, SpO2 98%.    Lamar Handler Jama Slain, DO

## 2024-02-08 DIAGNOSIS — F1914 Other psychoactive substance abuse with psychoactive substance-induced mood disorder: Secondary | ICD-10-CM

## 2024-02-08 DIAGNOSIS — F1310 Sedative, hypnotic or anxiolytic abuse, uncomplicated: Secondary | ICD-10-CM

## 2024-02-08 DIAGNOSIS — F209 Schizophrenia, unspecified: Secondary | ICD-10-CM

## 2024-02-08 DIAGNOSIS — F1210 Cannabis abuse, uncomplicated: Secondary | ICD-10-CM

## 2024-02-08 DIAGNOSIS — F1410 Cocaine abuse, uncomplicated: Secondary | ICD-10-CM

## 2024-02-08 DIAGNOSIS — F1510 Other stimulant abuse, uncomplicated: Secondary | ICD-10-CM

## 2024-02-08 DIAGNOSIS — F1010 Alcohol abuse, uncomplicated: Secondary | ICD-10-CM

## 2024-02-08 NOTE — BHH Suicide Risk Assessment (Signed)
 [facility] Discharge Suicide Risk Assessment   Principal Problem: Schizophrenia [facility]) Discharge Diagnoses: Principal Problem:   Schizophrenia (HCC) Active Problems:   Alcohol abuse   Polysubstance abuse (HCC)   Methamphetamine abuse (HCC)   Marijuana abuse   Cocaine abuse (HCC)   Substance induced mood disorder (HCC)   Mild benzodiazepine use disorder (HCC)   Total Time spent with patient: 30 minutes  Musculoskeletal: Strength & Muscle Tone: within normal limits Gait & Station: normal Patient leans: N/A  Psychiatric Specialty Exam  Presentation  General Appearance:  Casual  Eye Contact: Fair  Speech: Slow; Slurred  Speech Volume: Decreased  Handedness: Right   Mood and Affect  Mood: Euthymic  Duration of Depression Symptoms: Greater than two weeks  Affect: Blunt   Thought Process  Thought Processes: Coherent  Descriptions of Associations:Intact  Orientation:Full (Time, Place and Person)  Thought Content:Logical  History of Schizophrenia/Schizoaffective disorder:Yes  Duration of Psychotic Symptoms:Greater than six months  Hallucinations:Hallucinations: None  Ideas of Reference:None  Suicidal Thoughts:Suicidal Thoughts: No  Homicidal Thoughts:Homicidal Thoughts: No   Sensorium  Memory: Immediate Good  Judgment: Fair  Insight: Fair   Art Therapist  Concentration: Fair  Attention Span: Fair  Recall: Fair  Fund of Knowledge: Fair  Language: Fair   Psychomotor Activity  Psychomotor Activity: Psychomotor Activity: Normal   Assets  Assets: Desire for Improvement   Sleep  Sleep: Sleep: Good  Estimated Sleeping Duration (Last 24 Hours): 5.75 hours (Due to Daylight Saving Time, the duration displayed may not accurately represent documentation during the time change interval)  Physical Exam: Physical Exam Constitutional:      Appearance: Normal appearance. He is normal weight.  HENT:     Head:  Normocephalic and atraumatic.     Nose: Nose normal.  Musculoskeletal:        General: Normal range of motion.     Cervical back: Normal range of motion and neck supple.  Neurological:     General: No focal deficit present.     Mental Status: He is alert and oriented to person, place, and time. Mental status is at baseline.  Psychiatric:        Mood and Affect: Mood normal.        Behavior: Behavior normal.        Thought Content: Thought content normal.    ROS Blood pressure 114/75, pulse 89, temperature 97.8 F (36.6 C), temperature source Oral, resp. rate 16, height 5' 9.02 (1.753 m), weight 80.7 kg, SpO2 97%. Body mass index is 26.27 kg/m.  Mental Status Per Nursing Assessment::   On Admission:  Suicidal ideation indicated by others  Demographic Factors:  Male, Caucasian, Low socioeconomic status, Living alone, and Unemployed  Loss Factors: Decrease in vocational status, Loss of significant relationship, Financial problems/change in socioeconomic status, and homeless  Historical Factors: NA  Risk Reduction Factors:   Sense of responsibility to family  Continued Clinical Symptoms:  Depression:   Anhedonia Comorbid alcohol abuse/dependence Alcohol/Substance Abuse/Dependencies Schizophrenia:   Command hallucinatons More than one psychiatric diagnosis Unstable or Poor Therapeutic Relationship Previous Psychiatric Diagnoses and Treatments  Cognitive Features That Contribute To Risk:  Thought constriction (tunnel vision)    Suicide Risk:  Mild:  Suicidal ideation of limited frequency, intensity, duration, and specificity.  There are no identifiable plans, no associated intent, mild dysphoria and related symptoms, good self-control (both objective and subjective assessment), few other risk factors, and identifiable protective factors, including available and accessible social support.   Follow-up Information  Services, [facility] Follow up.   Why: Referral  made Contact information: [address] [place] [address] [phone]         [facility] Go on 02/08/2024.   Specialty: Addiction Medicine Why: A referral has been made to this provider for the substance abuse residential program.  Rosine will go to this provider on 02/08/24 at 8:00 am. Contact information: [address] [place] [address] [phone]         [facility] Follow up.   Specialty: Behavioral Health Why: You may go to this provider for medication management services, Monday through Friday, arrive by 7:00 am for your initial assessment. Contact information: [address] [place]  [address] [phone]        [facility] Follow up.   Why: You may call this provider for therapy and/or medication management services. Contact information: [address]  [address] [place] [address] [phone]                 Plan Of Care/Follow-up recommendations:  Activity:  No restrictions Diet:  Heart healthy Tests:  None Patient will be transferred to [facility] for inpatient substance use rehab for 30 days.  The patient will be given 14 days of medication samples and paper prescriptions for 30-day supply as well.  Outpatient follow-up will be with [facility] or [facility] behavioral health  Lamar Sherlean Jama Chandra, [place] 02/08/2024, 7:28 AM
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
## 2024-02-08 NOTE — Progress Notes (Signed)
 Patient verbalizes readiness for discharge. All patient belongings returned to patient. Discharge instructions read and discussed with patient (appointments, medications, resources). Patient expressed gratitude for care provided. Patient discharged to lobby at [phone] where taxi driver was waiting.

## 2024-02-08 NOTE — Progress Notes (Addendum)
(  Sleep Hours) -8.25 (Any PRNs that were needed, meds refused, or side effects to meds)- Atarax  (Any disturbances and when (visitation, over night)-none (Concerns raised by the patient)- none (SI/HI/AVH)-endorses AH

## 2024-02-08 NOTE — Plan of Care (Signed)
   Problem: Education: Goal: Emotional status will improve Outcome: Progressing Goal: Mental status will improve Outcome: Progressing

## 2024-02-11 ENCOUNTER — Other Ambulatory Visit: Payer: Self-pay

## 2024-02-11 ENCOUNTER — Encounter (HOSPITAL_COMMUNITY): Payer: Self-pay

## 2024-02-11 ENCOUNTER — Emergency Department (HOSPITAL_COMMUNITY)
Admission: EM | Admit: 2024-02-11 | Discharge: 2024-02-11 | Discharge status: Against Medical Advice | Payer: MEDICAID | Attending: Emergency Medicine | Admitting: Emergency Medicine

## 2024-02-11 DIAGNOSIS — F10120 Alcohol abuse with intoxication, uncomplicated: Secondary | ICD-10-CM | POA: Diagnosis present

## 2024-02-11 DIAGNOSIS — F10920 Alcohol use, unspecified with intoxication, uncomplicated: Secondary | ICD-10-CM

## 2024-02-11 NOTE — ED Notes (Signed)
 Pt keeps leaving his bed.  MD notified

## 2024-02-11 NOTE — ED Notes (Signed)
 Gray jacket was left. Name and MRN sticker placed on jacket. Jacket in pt belongings cabinet 1-4 if pt comes back.

## 2024-02-11 NOTE — ED Notes (Signed)
 Pt left bed and went out to lobby on hi own.  Sitting at the check in desk.  Able to encourage him back to bed.  Asking for food to eat.  Stated he knew that he was at the hospitla

## 2024-02-11 NOTE — ED Triage Notes (Addendum)
 PT arrives via EMS after PD called EMS due to patient being intoxicated. Pt arrives AxOx[facility]. He does appear to be intoxicated. He reports drinking heavily last night and this morning. Denies any other complaints.

## 2024-02-11 NOTE — ED Notes (Signed)
 Pt wandered out to the lobby and was redirected back to his hallway bed. Pt again wandered off. MD notified

## 2024-02-11 NOTE — ED Provider Notes (Signed)
 [place] EMERGENCY DEPARTMENT AT [facility] Provider Note   CSN: [id number] Arrival date & time: 02/11/24  1230     Patient presents with: Alcohol Intoxication   Jerry [name] is a 48 y.o. male.  {Add pertinent medical, surgical, social history, OB history to HPI:5036} 48 year old male with prior medical history as detailed below presents for evaluation.  Patient reports drinking excessively.  He is clinically intoxicated.  He is unable to provide additional details of recent illness.  The history is provided by the patient and medical records.       Prior to Admission medications   Medication Sig Start Date End Date Taking? Authorizing Provider  cholecalciferol  (CHOLECALCIFEROL ) 25 MCG tablet Take 1 tablet (1,000 Units total) by mouth daily. 02/08/24   Chandra Lamar Sherlean Jama, DO  haloperidol  (HALDOL ) 5 MG tablet Take 1 tablet (5 mg total) by mouth 2 (two) times daily. 02/07/24   Chandra Lamar Sherlean Jama, DO  Multiple Vitamin (MULTIVITAMIN WITH MINERALS) TABS tablet Take 1 tablet by mouth daily. 02/08/24   Chandra Lamar Sherlean Jama, DO  naltrexone  (DEPADE) 50 MG tablet Take 1 tablet (50 mg total) by mouth daily. 02/08/24   Chandra Lamar Sherlean Jama, DO  nicotine  (NICODERM CQ  - DOSED IN MG/24 HOURS) 14 mg/24hr patch Place 1 patch (14 mg total) onto the skin daily. 02/08/24   Chandra Lamar Sherlean Jama, DO    Allergies: Penicillins    Review of Systems  Unable to perform ROS: Acuity of condition    Updated Vital Signs BP 121/73   Pulse 76   Temp 97.8 F (36.6 C) (Oral)   Resp 18   SpO2 95%   Physical Exam Vitals and nursing note reviewed.  Constitutional:      General: He is not in acute distress.    Appearance: Normal appearance. He is well-developed.     Comments: Clinically intoxicated, disheveled  HENT:     Head: Normocephalic and atraumatic.  Eyes:     Conjunctiva/sclera: Conjunctivae normal.     Pupils: Pupils are equal, round,  and reactive to light.  Cardiovascular:     Rate and Rhythm: Normal rate and regular rhythm.     Heart sounds: Normal heart sounds.  Pulmonary:     Effort: Pulmonary effort is normal. No respiratory distress.     Breath sounds: Normal breath sounds.  Abdominal:     General: There is no distension.     Palpations: Abdomen is soft.     Tenderness: There is no abdominal tenderness.  Musculoskeletal:        General: No deformity. Normal range of motion.     Cervical back: Normal range of motion and neck supple.  Skin:    General: Skin is warm and dry.  Neurological:     General: No focal deficit present.     Mental Status: He is alert and oriented to person, place, and time.     (all labs ordered are listed, but only abnormal results are displayed) Labs Reviewed  ETHANOL  COMPREHENSIVE METABOLIC PANEL WITH GFR  CBC WITH DIFFERENTIAL/PLATELET    EKG: None  Radiology: No results found.  {Document cardiac monitor, telemetry assessment procedure when appropriate:32947} Procedures   Medications Ordered in the ED - No data to display    {Click here for ABCD2, HEART and other calculators REFRESH Note before signing:1}  Medical Decision Making Patient reports excessive alcohol intake.  He is clinically intoxicated.  Screening labs ordered.  Oncoming EDP is aware of case.  Amount and/or Complexity of Data Reviewed Labs: ordered.   ***  {Document critical care time when appropriate  Document review of labs and clinical decision tools ie CHADS2VASC2, etc  Document your independent review of radiology images and any outside records  Document your discussion with family members, caretakers and with consultants  Document social determinants of health affecting pt's care  Document your decision making why or why not admission, treatments were needed:32947:::1}   Final diagnoses:  Alcoholic intoxication without complication    ED Discharge  Orders     None

## 2024-02-13 NOTE — Progress Notes (Signed)

## 2024-02-14 ENCOUNTER — Emergency Department (HOSPITAL_COMMUNITY)
Admission: EM | Admit: 2024-02-14 | Discharge: 2024-02-15 | Disposition: A | Discharge status: Other Acute Inpatient Hospital | Payer: MEDICAID | Attending: Emergency Medicine | Admitting: Emergency Medicine

## 2024-02-14 ENCOUNTER — Encounter (HOSPITAL_COMMUNITY): Payer: Self-pay | Admitting: Emergency Medicine

## 2024-02-14 ENCOUNTER — Other Ambulatory Visit: Payer: Self-pay

## 2024-02-14 DIAGNOSIS — F200 Paranoid schizophrenia: Secondary | ICD-10-CM | POA: Diagnosis not present

## 2024-02-14 DIAGNOSIS — F1910 Other psychoactive substance abuse, uncomplicated: Secondary | ICD-10-CM | POA: Diagnosis present

## 2024-02-14 DIAGNOSIS — F17200 Nicotine dependence, unspecified, uncomplicated: Secondary | ICD-10-CM | POA: Insufficient documentation

## 2024-02-14 DIAGNOSIS — F1310 Sedative, hypnotic or anxiolytic abuse, uncomplicated: Secondary | ICD-10-CM | POA: Insufficient documentation

## 2024-02-14 DIAGNOSIS — R45851 Suicidal ideations: Secondary | ICD-10-CM | POA: Diagnosis not present

## 2024-02-14 DIAGNOSIS — F4310 Post-traumatic stress disorder, unspecified: Secondary | ICD-10-CM | POA: Insufficient documentation

## 2024-02-14 DIAGNOSIS — F1210 Cannabis abuse, uncomplicated: Secondary | ICD-10-CM | POA: Insufficient documentation

## 2024-02-14 DIAGNOSIS — F1410 Cocaine abuse, uncomplicated: Secondary | ICD-10-CM | POA: Insufficient documentation

## 2024-02-14 DIAGNOSIS — F1994 Other psychoactive substance use, unspecified with psychoactive substance-induced mood disorder: Secondary | ICD-10-CM

## 2024-02-14 DIAGNOSIS — Z5900 Homelessness unspecified: Secondary | ICD-10-CM | POA: Insufficient documentation

## 2024-02-14 LAB — CBC WITH DIFFERENTIAL/PLATELET
Abs Immature Granulocytes: 0.02 K/uL (ref 0.00–0.07)
Basophils Absolute: 0.1 K/uL (ref 0.0–0.1)
Basophils Relative: 1 %
Eosinophils Absolute: 0.3 K/uL (ref 0.0–0.5)
Eosinophils Relative: 4 %
HCT: 42.3 % (ref 39.0–52.0)
Hemoglobin: 14.3 g/dL (ref 13.0–17.0)
Immature Granulocytes: 0 %
Lymphocytes Relative: 35 %
Lymphs Abs: 2.8 K/uL (ref 0.7–4.0)
MCH: 31.3 pg (ref 26.0–34.0)
MCHC: 33.8 g/dL (ref 30.0–36.0)
MCV: 92.6 fL (ref 80.0–100.0)
Monocytes Absolute: 1 K/uL (ref 0.1–1.0)
Monocytes Relative: 13 %
Neutro Abs: 3.7 K/uL (ref 1.7–7.7)
Neutrophils Relative %: 47 %
Platelets: 450 K/uL — ABNORMAL HIGH (ref 150–400)
RBC: 4.57 MIL/uL (ref 4.22–5.81)
RDW: 13.7 % (ref 11.5–15.5)
WBC: 8 K/uL (ref 4.0–10.5)
nRBC: 0 % (ref 0.0–0.2)

## 2024-02-14 LAB — COMPREHENSIVE METABOLIC PANEL WITH GFR
ALT: 83 U/L — ABNORMAL HIGH (ref 0–44)
AST: 66 U/L — ABNORMAL HIGH (ref 15–41)
Albumin: 3.5 g/dL (ref 3.5–5.0)
Alkaline Phosphatase: 67 U/L (ref 38–126)
Anion gap: 11 (ref 5–15)
BUN: 6 mg/dL (ref 6–20)
CO2: 24 mmol/L (ref 22–32)
Calcium: 9.4 mg/dL (ref 8.9–10.3)
Chloride: 97 mmol/L — ABNORMAL LOW (ref 98–111)
Creatinine, Ser: 0.86 mg/dL (ref 0.61–1.24)
GFR, Estimated: >60 mL/min (ref >60)
Glucose, Bld: 102 mg/dL — ABNORMAL HIGH (ref 70–99)
Potassium: 3.7 mmol/L (ref 3.5–5.1)
Sodium: 132 mmol/L — ABNORMAL LOW (ref 135–145)
Total Bilirubin: 1.1 mg/dL (ref 0.0–1.2)
Total Protein: 8.8 g/dL — ABNORMAL HIGH (ref 6.5–8.1)

## 2024-02-14 LAB — LIPASE, BLOOD: Lipase: 27 U/L (ref 11–51)

## 2024-02-14 LAB — MAGNESIUM: Magnesium: 1.9 mg/dL (ref 1.7–2.4)

## 2024-02-14 MED ORDER — LORAZEPAM 1 MG PO TABS
2.0000 mg | ORAL_TABLET | Freq: Once | ORAL | Status: AC
Start: 2024-02-14 — End: 2024-02-14
  Administered 2024-02-14: 2 mg via ORAL
  Filled 2024-02-14: qty 2

## 2024-02-14 MED ORDER — NAPROXEN 250 MG PO TABS
500.0000 mg | ORAL_TABLET | Freq: Once | ORAL | Status: AC
  Administered 2024-02-14: 500 mg via ORAL
  Filled 2024-02-14: qty 2

## 2024-02-14 NOTE — ED Provider Triage Note (Signed)
 Emergency Medicine Provider Triage Evaluation Note  YALE GOLLA , a 48 y.o. male  was evaluated in triage.  Pt complains of withdrawal symptoms. He feels tremulous and agitated. Has been off his psych meds. Has had thoughts of walking into traffic, but not overtly suicidal. No HI or AVH.  Review of Systems  Positive: As above Negative: As above  Physical Exam  BP 126/79 (BP Location: Right Arm)   Pulse 98   Temp 98.1 F (36.7 C)   Resp 16   SpO2 99%  Gen:   Awake, no distress   Resp:  Normal effort  MSK:   Moves extremities without difficulty  Other:  Mildly tremulous  Medical Decision Making  Medically screening exam initiated at 10:31 PM.  Appropriate orders placed.  BATU CASSIN was informed that the remainder of the evaluation will be completed by another provider, this initial triage assessment does not replace that evaluation, and the importance of remaining in the ED until their evaluation is complete.  Polysubstance abuse - last drank at 0930. Used Heroin yesterday as well as crack today. Has been off his psych meds. He wants to be back on these.   Zarius, Furr, PA-C 02/14/24 2238

## 2024-02-14 NOTE — ED Notes (Signed)
 Food and drink provider to pt per his request. Pt able to tolerate PO food and drink with no signs of nausea or vomiting.

## 2024-02-14 NOTE — ED Triage Notes (Signed)
 Pt arrive by [facility] requesting Detox and withdraw symptoms with nausea started today. Per pt he last use 3g of Cocaine and Crack last night and some bear today. BP 130/82, HR 112, SPO2 97% RA.

## 2024-02-15 ENCOUNTER — Other Ambulatory Visit (HOSPITAL_COMMUNITY): Admission: EM | Admit: 2024-02-15 | Discharge: 2024-02-19 | Disposition: A | Payer: MEDICAID | Source: Intra-hospital

## 2024-02-15 ENCOUNTER — Emergency Department (HOSPITAL_COMMUNITY): Payer: MEDICAID

## 2024-02-15 DIAGNOSIS — E559 Vitamin D deficiency, unspecified: Secondary | ICD-10-CM

## 2024-02-15 DIAGNOSIS — Z79899 Other long term (current) drug therapy: Secondary | ICD-10-CM | POA: Diagnosis not present

## 2024-02-15 DIAGNOSIS — F1190 Opioid use, unspecified, uncomplicated: Secondary | ICD-10-CM | POA: Diagnosis not present

## 2024-02-15 DIAGNOSIS — F1020 Alcohol dependence, uncomplicated: Secondary | ICD-10-CM | POA: Insufficient documentation

## 2024-02-15 DIAGNOSIS — F209 Schizophrenia, unspecified: Secondary | ICD-10-CM

## 2024-02-15 DIAGNOSIS — G4700 Insomnia, unspecified: Secondary | ICD-10-CM | POA: Diagnosis not present

## 2024-02-15 DIAGNOSIS — Z8249 Family history of ischemic heart disease and other diseases of the circulatory system: Secondary | ICD-10-CM | POA: Diagnosis not present

## 2024-02-15 DIAGNOSIS — F1420 Cocaine dependence, uncomplicated: Secondary | ICD-10-CM | POA: Diagnosis not present

## 2024-02-15 DIAGNOSIS — F1994 Other psychoactive substance use, unspecified with psychoactive substance-induced mood disorder: Secondary | ICD-10-CM | POA: Diagnosis present

## 2024-02-15 DIAGNOSIS — R9431 Abnormal electrocardiogram [ECG] [EKG]: Secondary | ICD-10-CM | POA: Diagnosis not present

## 2024-02-15 DIAGNOSIS — F29 Unspecified psychosis not due to a substance or known physiological condition: Secondary | ICD-10-CM | POA: Diagnosis present

## 2024-02-15 DIAGNOSIS — F1910 Other psychoactive substance abuse, uncomplicated: Secondary | ICD-10-CM

## 2024-02-15 DIAGNOSIS — F1220 Cannabis dependence, uncomplicated: Secondary | ICD-10-CM | POA: Diagnosis not present

## 2024-02-15 DIAGNOSIS — Z560 Unemployment, unspecified: Secondary | ICD-10-CM | POA: Diagnosis not present

## 2024-02-15 DIAGNOSIS — R45851 Suicidal ideations: Secondary | ICD-10-CM | POA: Diagnosis not present

## 2024-02-15 DIAGNOSIS — F1410 Cocaine abuse, uncomplicated: Secondary | ICD-10-CM | POA: Insufficient documentation

## 2024-02-15 DIAGNOSIS — Z833 Family history of diabetes mellitus: Secondary | ICD-10-CM | POA: Diagnosis not present

## 2024-02-15 DIAGNOSIS — R7401 Elevation of levels of liver transaminase levels: Secondary | ICD-10-CM | POA: Diagnosis not present

## 2024-02-15 LAB — URINALYSIS, ROUTINE W REFLEX MICROSCOPIC
Bilirubin Urine: NEGATIVE
Glucose, UA: NEGATIVE mg/dL
Hgb urine dipstick: NEGATIVE
Ketones, ur: NEGATIVE mg/dL
Leukocytes,Ua: NEGATIVE
Nitrite: NEGATIVE
Protein, ur: NEGATIVE mg/dL
Specific Gravity, Urine: 1.008 (ref 1.005–1.030)
pH: 7 (ref 5.0–8.0)

## 2024-02-15 LAB — RAPID URINE DRUG SCREEN, HOSP PERFORMED
Amphetamines: NOT DETECTED
Barbiturates: NOT DETECTED
Benzodiazepines: POSITIVE — AB
Cocaine: POSITIVE — AB
Opiates: NOT DETECTED
Tetrahydrocannabinol: POSITIVE — AB

## 2024-02-15 LAB — RESP PANEL BY RT-PCR (RSV, FLU A&B, COVID)  RVPGX2
Influenza A by PCR: NEGATIVE
Influenza B by PCR: NEGATIVE
Resp Syncytial Virus by PCR: NEGATIVE
SARS Coronavirus 2 by RT PCR: NEGATIVE

## 2024-02-15 MED ORDER — LORAZEPAM 2 MG/ML IJ SOLN: 0.0000 mg | Freq: Four times a day (QID) | INTRAMUSCULAR | Status: DC

## 2024-02-15 MED ORDER — THIAMINE HCL 100 MG/ML IJ SOLN: 100.0000 mg | Freq: Once | INTRAMUSCULAR | Status: AC

## 2024-02-15 MED ORDER — HALOPERIDOL 5 MG PO TABS
5.0000 mg | ORAL_TABLET | Freq: Two times a day (BID) | ORAL | Status: DC
  Administered 2024-02-16 – 2024-02-17 (×3): 5 mg via ORAL
  Filled 2024-02-15 (×4): qty 1

## 2024-02-15 MED ORDER — LORAZEPAM 1 MG PO TABS: 0.0000 mg | ORAL_TABLET | Freq: Two times a day (BID) | ORAL | Status: DC

## 2024-02-15 MED ORDER — MAGNESIUM HYDROXIDE 400 MG/5ML PO SUSP: 30.0000 mL | Freq: Every day | ORAL | Status: DC | PRN

## 2024-02-15 MED ORDER — DIPHENHYDRAMINE HCL 50 MG PO CAPS
50.0000 mg | ORAL_CAPSULE | Freq: Three times a day (TID) | ORAL | Status: DC | PRN
  Administered 2024-02-18: 50 mg via ORAL
  Filled 2024-02-15 (×2): qty 1

## 2024-02-15 MED ORDER — ALUM & MAG HYDROXIDE-SIMETH 200-200-20 MG/5ML PO SUSP: 30.0000 mL | ORAL | Status: DC | PRN

## 2024-02-15 MED ORDER — NICOTINE 21 MG/24HR TD PT24
21.0000 mg | MEDICATED_PATCH | Freq: Every day | TRANSDERMAL | Status: DC
  Administered 2024-02-18 – 2024-02-19 (×2): 21 mg via TRANSDERMAL
  Filled 2024-02-15 (×4): qty 1

## 2024-02-15 MED ORDER — HYDROXYZINE HCL 25 MG PO TABS
25.0000 mg | ORAL_TABLET | Freq: Four times a day (QID) | ORAL | Status: AC | PRN
  Administered 2024-02-16 – 2024-02-17 (×3): 25 mg via ORAL
  Filled 2024-02-15 (×4): qty 1

## 2024-02-15 MED ORDER — ADULT MULTIVITAMIN W/MINERALS CH
1.0000 | ORAL_TABLET | Freq: Every day | ORAL | Status: DC
  Administered 2024-02-15 – 2024-02-19 (×5): 1 via ORAL
  Filled 2024-02-15 (×5): qty 1

## 2024-02-15 MED ORDER — HALOPERIDOL 5 MG PO TABS
5.0000 mg | ORAL_TABLET | Freq: Three times a day (TID) | ORAL | Status: DC | PRN
  Filled 2024-02-15: qty 1

## 2024-02-15 MED ORDER — VITAMIN D 25 MCG (1000 UNIT) PO TABS
1000.0000 [IU] | ORAL_TABLET | Freq: Once | ORAL | Status: AC
  Administered 2024-02-15: 1000 [IU] via ORAL
  Filled 2024-02-15: qty 1

## 2024-02-15 MED ORDER — ONDANSETRON 4 MG PO TBDP
4.0000 mg | ORAL_TABLET | Freq: Four times a day (QID) | ORAL | Status: AC | PRN
  Administered 2024-02-16: 4 mg via ORAL
  Filled 2024-02-15: qty 1

## 2024-02-15 MED ORDER — THIAMINE MONONITRATE 100 MG PO TABS
100.0000 mg | ORAL_TABLET | Freq: Every day | ORAL | Status: DC
  Administered 2024-02-16 – 2024-02-19 (×4): 100 mg via ORAL
  Filled 2024-02-15 (×4): qty 1

## 2024-02-15 MED ORDER — DIPHENHYDRAMINE HCL 50 MG/ML IJ SOLN: 50.0000 mg | Freq: Three times a day (TID) | INTRAMUSCULAR | Status: DC | PRN

## 2024-02-15 MED ORDER — ACETAMINOPHEN 325 MG PO TABS
650.0000 mg | ORAL_TABLET | Freq: Four times a day (QID) | ORAL | Status: DC | PRN
  Administered 2024-02-16 – 2024-02-18 (×2): 650 mg via ORAL
  Filled 2024-02-15 (×2): qty 2

## 2024-02-15 MED ORDER — LORAZEPAM 2 MG/ML IJ SOLN: 2.0000 mg | Freq: Three times a day (TID) | INTRAMUSCULAR | Status: DC | PRN

## 2024-02-15 MED ORDER — LORAZEPAM 2 MG/ML IJ SOLN: 0.0000 mg | Freq: Two times a day (BID) | INTRAMUSCULAR | Status: DC

## 2024-02-15 MED ORDER — THIAMINE MONONITRATE 100 MG PO TABS
100.0000 mg | ORAL_TABLET | Freq: Every day | ORAL | Status: DC
  Administered 2024-02-15: 100 mg via ORAL
  Filled 2024-02-15: qty 1

## 2024-02-15 MED ORDER — LOPERAMIDE HCL 2 MG PO CAPS: 2.0000 mg | ORAL_CAPSULE | ORAL | Status: AC | PRN

## 2024-02-15 MED ORDER — LORAZEPAM 1 MG PO TABS
0.0000 mg | ORAL_TABLET | Freq: Four times a day (QID) | ORAL | Status: DC
  Administered 2024-02-15 (×3): 1 mg via ORAL
  Filled 2024-02-15 (×3): qty 1

## 2024-02-15 MED ORDER — HALOPERIDOL LACTATE 5 MG/ML IJ SOLN: 10.0000 mg | Freq: Three times a day (TID) | INTRAMUSCULAR | Status: DC | PRN

## 2024-02-15 MED ORDER — HALOPERIDOL LACTATE 5 MG/ML IJ SOLN: 5.0000 mg | Freq: Three times a day (TID) | INTRAMUSCULAR | Status: DC | PRN

## 2024-02-15 MED ORDER — THIAMINE HCL 100 MG/ML IJ SOLN: 100.0000 mg | Freq: Every day | INTRAMUSCULAR | Status: DC

## 2024-02-15 MED ORDER — LORAZEPAM 1 MG PO TABS
1.0000 mg | ORAL_TABLET | Freq: Four times a day (QID) | ORAL | Status: AC | PRN
  Filled 2024-02-15: qty 1

## 2024-02-15 NOTE — ED Notes (Signed)
 Patient transferred from [facility] to [facility] requesting assistance in detox from many substances, endorses recent SI. Calm, cooperative throughout interview process. Skin assessment completed - scrapes and sores along buttock from reported fall 2 days ago, scrapes appear to be in various stages of healing, provider made aware. Oriented to unit. Meal and drink offered.  Patient alert & oriented x3, disoriented to time (mis-remembers date even after being told a few minutes prior). Denies intent to harm self or others when asked. Denies VH, endorses AH of unintelligible whispers. Patient denies any physical complaints when asked. No acute distress noted. Support and encouragement provided. Routine safety checks conducted per facility protocol. Encouraged patient to notify staff if any thoughts of harm towards self or others arise. Patient verbalizes understanding and agreement.

## 2024-02-15 NOTE — Group Note (Signed)
 Group Topic: Social Support  Group Date: 02/15/2024 Start Time: 1700 End Time: 1730 Facilitators: Elba Schaber, Zane HERO, RN  Department: [facility]  Number of Participants: 1  Group Focus: check in Treatment Modality:  Individual Therapy Interventions utilized were support Purpose: express feelings  Name: Jerry [name] Date of Birth: [birth year]  MR: [id number]    Level of Participation: moderate, when cued Quality of Participation: cooperative Interactions with others: gave feedback Mood/Affect: flat Triggers (if applicable): None identified at this time Cognition: confused and not focused Progress: Gaining insight Response: Patient voiced understanding of the unit rules and layout. No questions or concerns voiced at this time. Patient voices understanding of who to reach out to in case of future questions. Plan: patient will be encouraged to attend groups/programming on the unit  Patients Problems:  Patient Active Problem List   Diagnosis Date Noted   Alcohol use disorder, severe, dependence (HCC) 02/15/2024   Cocaine use disorder (HCC) 02/15/2024   Mild benzodiazepine use disorder (HCC) 02/06/2024   Protein-calorie malnutrition, severe 07/03/2023   Left arm cellulitis 07/01/2023   Jaw pain 11/29/2022   Need for influenza vaccination 11/29/2022   Erectile dysfunction 11/29/2022   Hyponatremia 11/29/2022   History of hepatitis C virus infection 11/29/2022   Tardive dyskinesia 10/24/2022   Generalized anxiety disorder 10/24/2022   PTSD (post-traumatic stress disorder) 10/24/2022   Healthcare maintenance 10/24/2022   Schizophrenia (HCC) 07/07/2022   Malingering 09/07/2020   Homelessness 09/07/2020   Schizophrenia spectrum disorder with psychotic disorder type not yet determined (HCC) 07/17/2020   Methamphetamine abuse (HCC) 07/17/2020   Marijuana abuse 07/17/2020   Cocaine abuse (HCC) 07/17/2020   Anxiety and depression 06/25/2014   Family  history of diabetes mellitus (DM) 06/25/2014   Family history of thyroid  disease 06/25/2014   Tobacco use disorder 06/25/2014   Alcohol abuse 05/18/2013   Polysubstance abuse (HCC) 05/18/2013   Depression, unspecified 05/18/2013

## 2024-02-15 NOTE — ED Provider Notes (Signed)
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 and metabolites        300 Cocaine and metabolites        300 THC                            50 Performed at [facility], [address] [place], [place] [address]   Admission on 01/07/2024, Discharged on 01/07/2024  Component Date Value Ref Range Status   Sodium 01/07/2024 133 (L)  135 - 145 mmol/L Final   Potassium 01/07/2024 3.6  3.5 - 5.1 mmol/L Final   Chloride 01/07/2024 98  98 - 111 mmol/L Final   CO2 01/07/2024 24  22 - 32 mmol/L Final   Glucose, Bld 01/07/2024 106 (H)  70 - 99 mg/dL Final   Glucose reference range applies only to samples taken after fasting for at least 8 hours.   BUN 01/07/2024 6  6 - 20 mg/dL Final   Creatinine, Ser 01/07/2024 0.80  0.61 - 1.24 mg/dL Final   Calcium 89/79/7974 9.2  8.9 - 10.3 mg/dL Final   Total Protein 89/79/7974 7.9  6.5 - 8.1 g/dL Final   Albumin 89/79/7974 3.9  3.5 - 5.0 g/dL Final   AST 89/79/7974 106 (H)  15 - 41 U/L Final   ALT 01/07/2024 131 (H)  0 - 44 U/L Final   Alkaline Phosphatase 01/07/2024 78  38 - 126 U/L Final   Total Bilirubin 01/07/2024 0.8  0.0 - 1.2 mg/dL Final   GFR, Estimated 01/07/2024 >60  >60 mL/min Final   Comment: (NOTE) Calculated using the CKD-EPI Creatinine Equation (2021)    Anion gap 01/07/2024 11  5 - 15 Final   Performed at [facility], [address] [place], [place] [address]   Alcohol, Ethyl (B) 01/07/2024 <15  <15 mg/dL Final   Comment: (NOTE) For medical purposes only. Performed at [facility], [address], [place], [place] [address]    Opiates 01/07/2024 NEGATIVE  NEGATIVE Final   Cocaine 01/07/2024 POSITIVE (A)  NEGATIVE Final   Benzodiazepines 01/07/2024 NEGATIVE  NEGATIVE Final   Amphetamines 01/07/2024 POSITIVE (A)  NEGATIVE Final   Tetrahydrocannabinol 01/07/2024 NEGATIVE  NEGATIVE Final   Barbiturates 01/07/2024 NEGATIVE  NEGATIVE Final   Methadone Scn, Ur 01/07/2024 NEGATIVE  NEGATIVE Final   Fentanyl  01/07/2024 NEGATIVE  NEGATIVE Final   Comment: (NOTE) Drug screen is for Medical Purposes only. Positive results are preliminary only. If confirmation is needed, notify lab  within 5 days.  Drug Class                 Cutoff (ng/mL) Amphetamine and metabolites 1000 Barbiturate and metabolites 200 Benzodiazepine              200 Opiates and metabolites     300 Cocaine and metabolites     300 THC                         50 Fentanyl                     5 Methadone                   300  Trazodone  is metabolized in vivo to several metabolites,  including pharmacologically active m-CPP, which is excreted in the  urine.  Immunoassay screens for amphetamines and MDMA have potential  cross-reactivity with these compounds and may provide false positive  result.  Performed at [facility], [address] [place], [place] [address]    WBC 01/07/2024 9.5  4.0 - 10.5 K/uL Final   RBC 01/07/2024 4.30  4.22 - 5.81 MIL/uL Final   Hemoglobin 01/07/2024 13.0  13.0 - 17.0 g/dL Final   HCT 89/79/7974 39.7  39.0 - 52.0 % Final   MCV 01/07/2024 92.3  80.0 - 100.0 fL Final   MCH 01/07/2024 30.2  26.0 - 34.0 pg Final  MCHC 01/07/2024 32.7  30.0 - 36.0 g/dL Final   RDW 89/79/7974 13.2  11.5 - 15.5 % Final   Platelets 01/07/2024 385  150 - 400 K/uL Final   nRBC 01/07/2024 0.0  0.0 - 0.2 % Final   Neutrophils Relative % 01/07/2024 70  % Final   Neutro Abs 01/07/2024 6.5  1.7 - 7.7 K/uL Final   Lymphocytes Relative 01/07/2024 17  % Final   Lymphs Abs 01/07/2024 1.6  0.7 - 4.0 K/uL Final   Monocytes Relative 01/07/2024 11  % Final   Monocytes Absolute 01/07/2024 1.0  0.1 - 1.0 K/uL Final   Eosinophils Relative 01/07/2024 2  % Final   Eosinophils Absolute 01/07/2024 0.2  0.0 - 0.5 K/uL Final   Basophils Relative 01/07/2024 0  % Final   Basophils Absolute 01/07/2024 0.0  0.0 - 0.1 K/uL Final   Immature Granulocytes 01/07/2024 0  % Final   Abs Immature Granulocytes 01/07/2024 0.04  0.00 - 0.07 K/uL Final   Performed at [facility], [address] [place], [place] [address]  Admission on 01/06/2024, Discharged on 01/07/2024   Component Date Value Ref Range Status   Group A Strep by PCR 01/07/2024 NOT DETECTED  NOT DETECTED Final   Performed at [facility], [address] [place], [place] [address]   SARS Coronavirus 2 by RT PCR 01/07/2024 NEGATIVE  NEGATIVE Final   Influenza A by PCR 01/07/2024 NEGATIVE  NEGATIVE Final   Influenza B by PCR 01/07/2024 NEGATIVE  NEGATIVE Final   Comment: (NOTE) The Xpert Xpress SARS-CoV-2/FLU/RSV plus assay is intended as an aid in the diagnosis of influenza from Nasopharyngeal swab specimens and should not be used as a sole basis for treatment. Nasal washings and aspirates are unacceptable for Xpert Xpress SARS-CoV-2/FLU/RSV testing.  Fact Sheet for Patients: bloggercourse.com  Fact Sheet for Healthcare Providers: seriousbroker.it  This test is not yet approved or cleared by the [place]  FDA and has been authorized for detection and/or diagnosis of SARS-CoV-2 by FDA under an Emergency Use Authorization (EUA). This EUA will remain in effect (meaning this test can be used) for the duration of the COVID-19 declaration under Section 564(b)(1) of the Act, 21 U.S.C. section 360bbb-3(b)(1), unless the authorization is terminated or revoked.     Resp Syncytial Virus by PCR 01/07/2024 NEGATIVE  NEGATIVE Final   Comment: (NOTE) Fact Sheet for Patients: bloggercourse.com  Fact Sheet for Healthcare Providers: seriousbroker.it  This test is not yet approved or cleared by the [place]  FDA and has been authorized for detection and/or diagnosis of SARS-CoV-2 by FDA under an Emergency Use Authorization (EUA). This EUA will remain in effect (meaning this test can be used) for the duration of the COVID-19 declaration under Section 564(b)(1) of the Act, 21 U.S.C. section 360bbb-3(b)(1), unless the authorization is terminated or revoked.  Performed at [facility], [address] [place], [place] [address]   Admission on 01/06/2024, Discharged on 01/06/2024  Component Date Value Ref Range Status   Sodium 01/06/2024 135  135 - 145 mmol/L Final   Potassium 01/06/2024 3.3 (L)  3.5 - 5.1 mmol/L Final   Chloride 01/06/2024 99  98 - 111 mmol/L Final   CO2 01/06/2024 22  22 - 32 mmol/L Final   Glucose, Bld 01/06/2024 104 (H)  70 - 99 mg/dL Final   Glucose reference range applies only to samples taken after fasting for at least 8 hours.   BUN 01/06/2024 10  6 - 20 mg/dL Final   Creatinine, Ser 01/06/2024 0.85  0.61 - 1.24 mg/dL Final   Calcium 89/80/7974 9.3  8.9 - 10.3 mg/dL Final   Total Protein 89/80/7974 8.5 (H)  6.5 - 8.1 g/dL Final   Albumin 89/80/7974 4.2  3.5 - 5.0 g/dL Final   AST 89/80/7974 107 (H)  15 - 41 U/L Final   ALT 01/06/2024 132 (H)  0 - 44 U/L Final   Alkaline Phosphatase 01/06/2024 77  38 - 126 U/L Final   Total Bilirubin 01/06/2024 0.7  0.0 - 1.2 mg/dL Final   GFR, Estimated 01/06/2024 >60  >60 mL/min Final   Comment: (NOTE) Calculated using the CKD-EPI Creatinine Equation (2021)    Anion gap 01/06/2024 14  5 - 15 Final   Performed at [facility], [address] [place], [place] [address]   Alcohol, Ethyl (B) 01/06/2024 <15  <15 mg/dL Final   Comment: (NOTE) For medical purposes only. Performed at [facility], [address], [place], [place] [address]    WBC 01/06/2024 7.8  4.0 - 10.5 K/uL Final   RBC 01/06/2024 4.25  4.22 - 5.81 MIL/uL Final   Hemoglobin 01/06/2024 13.0  13.0 - 17.0 g/dL Final   HCT 89/80/7974 38.8 (L)  39.0 - 52.0 % Final   MCV 01/06/2024 91.3  80.0 - 100.0 fL Final   MCH 01/06/2024 30.6  26.0 - 34.0 pg Final   MCHC 01/06/2024 33.5  30.0 - 36.0 g/dL Final   RDW 89/80/7974 13.2  11.5 - 15.5 % Final   Platelets 01/06/2024 404 (H)  150 - 400 K/uL Final   nRBC 01/06/2024 0.0  0.0 - 0.2 % Final   Performed at [facility], [address] [place], [place] [address]   Opiates 01/06/2024 NEGATIVE  NEGATIVE Final   Cocaine 01/06/2024 POSITIVE (A)  NEGATIVE Final   Benzodiazepines 01/06/2024 POSITIVE (A)  NEGATIVE Final   Amphetamines 01/06/2024 POSITIVE (A)  NEGATIVE Final   Tetrahydrocannabinol 01/06/2024 POSITIVE (A)  NEGATIVE Final   Barbiturates 01/06/2024 NEGATIVE  NEGATIVE Final   Methadone Scn, Ur 01/06/2024 NEGATIVE  NEGATIVE Final   Fentanyl  01/06/2024 NEGATIVE  NEGATIVE Final   Comment: (NOTE) Drug screen is for Medical Purposes only. Positive results are preliminary only. If confirmation is needed, notify lab within 5 days.  Drug Class                 Cutoff (ng/mL) Amphetamine and metabolites 1000 Barbiturate and metabolites 200 Benzodiazepine              200 Opiates and metabolites     300 Cocaine and metabolites     300 THC                         50 Fentanyl                     5 Methadone                   300  Trazodone  is metabolized in vivo to several metabolites,  including pharmacologically active m-CPP, which is excreted in the  urine.  Immunoassay screens for amphetamines and MDMA have potential  cross-reactivity with these compounds and may provide false positive  result.  Performed at [facility], [address], [place], [place] [address]    Acetaminophen  (Tylenol ), Serum 01/06/2024 <10 (L)  10 - 30  ug/mL Final   Comment: (NOTE) Toxic concentrations can be more effectively related to post dose interval; > 200, > 100, and > 50 ug/mL serum concentrations correspond to toxic concentrations at 4, 8, and 12 hours post dose, respectively.  Performed at [facility], [address] [place], [place] [address]    Salicylate Lvl 01/06/2024 <7.0 (L)  7.0 - 30.0 mg/dL Final   Performed at [facility], [address] [place], [place] [address]   Color, Urine 01/06/2024 AMBER (A)  YELLOW Final   BIOCHEMICALS MAY BE AFFECTED BY COLOR    APPearance 01/06/2024 HAZY (A)  CLEAR Final   Specific Gravity, Urine 01/06/2024 1.026  1.005 - 1.030 Final   pH 01/06/2024 5.0  5.0 - 8.0 Final   Glucose, UA 01/06/2024 NEGATIVE  NEGATIVE mg/dL Final   Hgb urine dipstick 01/06/2024 SMALL (A)  NEGATIVE Final   Bilirubin Urine 01/06/2024 NEGATIVE  NEGATIVE Final   Ketones, ur 01/06/2024 5 (A)  NEGATIVE mg/dL Final   Protein, ur 89/80/7974 100 (A)  NEGATIVE mg/dL Final   Nitrite 89/80/7974 NEGATIVE  NEGATIVE Final   Leukocytes,Ua 01/06/2024 NEGATIVE  NEGATIVE Final   RBC / HPF 01/06/2024 11-20  0 - 5 RBC/hpf Final   WBC, UA 01/06/2024 11-20  0 - 5 WBC/hpf Final   Bacteria, UA 01/06/2024 RARE (A)  NONE SEEN Final   Squamous Epithelial / HPF 01/06/2024 0-5  0 - 5 /HPF Final   Mucus 01/06/2024 PRESENT   Final   Hyaline Casts, UA 01/06/2024 PRESENT   Final   Performed at [facility], [address], [place], [place] [address]  Admission on 01/04/2024, Discharged on 01/05/2024  Component Date Value Ref Range Status   RPR Ser Ql 01/04/2024 NON REACTIVE  NON REACTIVE Final   Performed at [facility], [address] [place], [place] [address]   HIV Screen 4th Generation wRfx 01/04/2024 Non Reactive  Non Reactive Final   Performed at [facility], [address] [place], [place] [address]   Neisseria Gonorrhea 01/05/2024 Negative   Final   Chlamydia 01/05/2024 Negative   Final   Comment 01/05/2024 Normal Reference Ranger Chlamydia - Negative   Final   Comment 01/05/2024 Normal Reference Range Neisseria Gonorrhea - Negative   Final  Admission on 01/04/2024, Discharged on 01/04/2024  Component Date Value Ref Range Status   Sodium 01/04/2024 133 (L)  135 - 145 mmol/L Final   Potassium 01/04/2024 3.2 (L)  3.5 - 5.1 mmol/L Final   Chloride 01/04/2024 99  98 - 111 mmol/L Final   CO2 01/04/2024 21 (L)  22 - 32 mmol/L Final   Glucose, Bld 01/04/2024 117 (H)  70 - 99 mg/dL Final   Glucose reference range applies only  to samples taken after fasting for at least 8 hours.   BUN 01/04/2024 5 (L)  6 - 20 mg/dL Final   Creatinine, Ser 01/04/2024 0.94  0.61 - 1.24 mg/dL Final   Calcium 89/82/7974 9.2  8.9 - 10.3 mg/dL Final   Total Protein 89/82/7974 9.1 (H)  6.5 - 8.1 g/dL Final   Albumin 89/82/7974 3.9  3.5 - 5.0 g/dL Final   AST 89/82/7974 85 (H)  15 - 41 U/L Final   ALT 01/04/2024 103 (H)  0 - 44 U/L Final   Alkaline Phosphatase 01/04/2024 77  38 - 126 U/L Final   Total Bilirubin 01/04/2024 1.5 (H)  0.0 - 1.2 mg/dL Final  GFR, Estimated 01/04/2024 >60  >60 mL/min Final   Comment: (NOTE) Calculated using the CKD-EPI Creatinine Equation (2021)    Anion gap 01/04/2024 13  5 - 15 Final   Performed at [facility], [address] [place], [place] [address]   Alcohol, Ethyl (B) 01/04/2024 <15  <15 mg/dL Final   Comment: (NOTE) For medical purposes only. Performed at [facility], [address] [place], [place] [address]    Opiates 01/04/2024 NONE DETECTED  NONE DETECTED Final   Cocaine 01/04/2024 POSITIVE (A)  NONE DETECTED Final   Benzodiazepines 01/04/2024 NONE DETECTED  NONE DETECTED Final   Amphetamines 01/04/2024 POSITIVE (A)  NONE DETECTED Final   Comment: (NOTE) Trazodone  is metabolized in vivo to several metabolites, including pharmacologically active m-CPP, which is excreted in the urine. Immunoassay screens for amphetamines and MDMA have potential cross-reactivity with these compounds and may provide false positive  results.     Tetrahydrocannabinol 01/04/2024 NONE DETECTED  NONE DETECTED Final   Barbiturates 01/04/2024 NONE DETECTED  NONE DETECTED Final   Comment: (NOTE) DRUG SCREEN FOR MEDICAL PURPOSES ONLY.  IF CONFIRMATION IS NEEDED FOR ANY PURPOSE, NOTIFY LAB WITHIN 5 DAYS.  LOWEST DETECTABLE LIMITS FOR URINE DRUG SCREEN Drug Class                     Cutoff (ng/mL) Amphetamine and metabolites    1000 Barbiturate and metabolites    200 Benzodiazepine                  200 Opiates and metabolites        300 Cocaine and metabolites        300 THC                            50 Performed at [facility], [address], [place], [place] [address]    WBC 01/04/2024 9.8  4.0 - 10.5 K/uL Final   RBC 01/04/2024 4.64  4.22 - 5.81 MIL/uL Final   Hemoglobin 01/04/2024 14.4  13.0 - 17.0 g/dL Final   HCT 89/82/7974 41.9  39.0 - 52.0 % Final   MCV 01/04/2024 90.3  80.0 - 100.0 fL Final   MCH 01/04/2024 31.0  26.0 - 34.0 pg Final   MCHC 01/04/2024 34.4  30.0 - 36.0 g/dL Final   RDW 89/82/7974 12.8  11.5 - 15.5 % Final   Platelets 01/04/2024 422 (H)  150 - 400 K/uL Final   nRBC 01/04/2024 0.0  0.0 - 0.2 % Final   Neutrophils Relative % 01/04/2024 55  % Final   Neutro Abs 01/04/2024 5.5  1.7 - 7.7 K/uL Final   Lymphocytes Relative 01/04/2024 25  % Final   Lymphs Abs 01/04/2024 2.5  0.7 - 4.0 K/uL Final   Monocytes Relative 01/04/2024 16  % Final   Monocytes Absolute 01/04/2024 1.6 (H)  0.1 - 1.0 K/uL Final   Eosinophils Relative 01/04/2024 3  % Final   Eosinophils Absolute 01/04/2024 0.3  0.0 - 0.5 K/uL Final   Basophils Relative 01/04/2024 1  % Final   Basophils Absolute 01/04/2024 0.1  0.0 - 0.1 K/uL Final   Immature Granulocytes 01/04/2024 0  % Final   Abs Immature Granulocytes 01/04/2024 0.03  0.00 - 0.07 K/uL Final   Performed at [facility], [address], [place], [place] [address]  Admission on 09/07/2023, Discharged on 09/11/2023  Component Date  Value Ref Range Status   Sodium 09/09/2023 136  135 - 145 mmol/L Final   Potassium 09/09/2023 4.5  3.5 - 5.1 mmol/L Final   Chloride 09/09/2023 99  98 - 111 mmol/L Final   CO2 09/09/2023 28  22 - 32 mmol/L Final   Glucose, Bld 09/09/2023 84  70 - 99 mg/dL Final   Glucose reference range applies only to samples taken after fasting for at least 8 hours.   BUN 09/09/2023 11  6 - 20 mg/dL Final   Creatinine, Ser 09/09/2023 1.16  0.61 - 1.24 mg/dL Final   Calcium 93/77/7974 9.8  8.9 - 10.3  mg/dL Final   Total Protein 93/77/7974 7.9  6.5 - 8.1 g/dL Final   Albumin 93/77/7974 3.0 (L)  3.5 - 5.0 g/dL Final   AST 93/77/7974 60 (H)  15 - 41 U/L Final   ALT 09/09/2023 59 (H)  0 - 44 U/L Final   Alkaline Phosphatase 09/09/2023 56  38 - 126 U/L Final   Total Bilirubin 09/09/2023 0.6  0.0 - 1.2 mg/dL Final   GFR, Estimated 09/09/2023 >60  >60 mL/min Final   Comment: (NOTE) Calculated using the CKD-EPI Creatinine Equation (2021)    Anion gap 09/09/2023 9  5 - 15 Final   Performed at [facility], [address] [place], [place] [address]   Vitamin B-12 09/09/2023 206  180 - 914 pg/mL Final   Comment: (NOTE) This assay is not validated for testing neonatal or myeloproliferative syndrome specimens for Vitamin B12 levels. Performed at [facility], [address] [place], [place] [address]    Vitamin B6 09/09/2023 7.0  3.4 - 65.2 ug/L Final   Comment: (NOTE) This test was developed and its performance characteristics determined by [facility]. It has not been cleared or approved by the Food and Drug Administration.                             Deficiency:         <3.4                             Marginal:      3.4 - 5.1                             Adequate:           >5.1 Performed At: [facility] [address] [place], [place] [id number] Jennette Shorter MD Ey:[id number]   There may be more visits with results that are not included.    Allergies: Penicillins  Medications:  Facility Ordered Medications  Medication   acetaminophen  (TYLENOL ) tablet 650 mg   alum & mag hydroxide-simeth (MAALOX/MYLANTA) 200-200-20 MG/5ML suspension 30 mL   magnesium  hydroxide (MILK OF MAGNESIA) suspension 30 mL   haloperidol  (HALDOL ) tablet 5 mg   And   diphenhydrAMINE  (BENADRYL ) capsule 50 mg   haloperidol  lactate (HALDOL ) injection 5 mg   And   diphenhydrAMINE  (BENADRYL ) injection 50 mg   And   LORazepam  (ATIVAN ) injection 2 mg   haloperidol  lactate (HALDOL )  injection 10 mg   And   diphenhydrAMINE  (BENADRYL ) injection 50 mg   And   LORazepam  (ATIVAN ) injection 2 mg   [START ON 02/16/2024] nicotine  (NICODERM CQ  - dosed in mg/24 hours) patch 21 mg   [COMPLETED] thiamine  (VITAMIN B1) injection  100 mg   [START ON 02/16/2024] thiamine  (VITAMIN B1) tablet 100 mg   multivitamin with minerals tablet 1 tablet   LORazepam  (ATIVAN ) tablet 1 mg   hydrOXYzine  (ATARAX ) tablet 25 mg   loperamide  (IMODIUM ) capsule 2-4 mg   ondansetron  (ZOFRAN -ODT) disintegrating tablet 4 mg   haloperidol  (HALDOL ) tablet 5 mg   cholecalciferol  (VITAMIN D3) 25 MCG (1000 UNIT) tablet 1,000 Units   PTA Medications  Medication Sig   haloperidol  (HALDOL ) 5 MG tablet Take 1 tablet (5 mg total) by mouth 2 (two) times daily. (Patient not taking: Reported on 02/15/2024)   nicotine  (NICODERM CQ  - DOSED IN MG/24 HOURS) 14 mg/24hr patch Place 1 patch (14 mg total) onto the skin daily. (Patient not taking: Reported on 02/15/2024)   naltrexone  (DEPADE) 50 MG tablet Take 1 tablet (50 mg total) by mouth daily. (Patient not taking: Reported on 02/15/2024)   cholecalciferol  (CHOLECALCIFEROL ) 25 MCG tablet Take 1 tablet (1,000 Units total) by mouth daily. (Patient not taking: Reported on 02/15/2024)   Multiple Vitamin (MULTIVITAMIN WITH MINERALS) TABS tablet Take 1 tablet by mouth daily. (Patient not taking: Reported on 02/15/2024)    Long Term Goals: Improvement in symptoms so as ready for discharge  Short Term Goals: Patient will verbalize feelings in meetings with treatment team members., Patient will attend at least of 50% of the groups daily., Pt will complete the PHQ9 on admission, day 3 and discharge., Patient will participate in completing the [place] Suicide Severity Rating Scale, Patient will score a low risk of violence for 24 hours prior to discharge, and Patient will take medications as prescribed daily.  Medical Decision Making  48yo M with h/o substance use disorder,  schizophrenia, presents after recent relapse on alcohol and cocaine and non-compliance with recently-prescribed psych hospital discharge medications. As a result, reports depressed mood, passive suicidal thoughts, and recurrence of auditory hallucinations .  Dx: Alcohol use disorder Cocaine use disorder Cannabis dependence Schizophrenia (per last hospital d/cancer summary)  Plan:  Continue [facility] admission; 15-minute checks; daily contact with patient to assess and evaluate symptoms and progress in treatment; psychoeducation.  Restart Haloperidol  5mg  PO BID for psychosis. EKG monitoring (Qtc 11/28),  Continue CIWA with MVI, thiamine  and PRN Ativan  1mg  Q6h for scores >10.  Re-referral to [facility] for residential addiction treatment.  Estimated LOS: 3-5 days   Total Time Spent in Direct Patient Care:  I personally spent 35 minutes on the unit in direct patient care. The direct patient care time included face-to-face time with the patient, reviewing the patient's chart, communicating with other professionals, and coordinating care. Greater than 50% of this time was spent in counseling or coordinating care with the patient regarding goals of hospitalization, psycho-education, and discharge planning needs.    Recommendations    Neil Appl, MD 02/15/24  3:40 PM

## 2024-02-15 NOTE — ED Provider Notes (Signed)
 [facility]-EMERGENCY DEPT [facility] Emergency Department Provider Note MRN:  [id number]  Arrival date & time: 02/15/24     Chief Complaint   Detox   History of Present Illness   Jerry [name] is a 48 y.o. year-old male presents to the ED with chief complaint of requesting detox from heroin, alcohol, and cocaine.  States that he was recently seen for similar and had been accepted at up treatment center, but he states that he never made it and then relapsed.  He reports having some passive suicidal thoughts, such as walking in front of traffic.  Denies any HI.  Denies any hallucinations.  States that his last drink of alcohol was yesterday, reports last use of heroin and cocaine were earlier.  States that he has had cough, but denies fever.  History provided by patient.   Review of Systems  Pertinent positive and negative review of systems noted in HPI.    Physical Exam   Vitals:   02/15/24 0128 02/15/24 0230  BP:  111/74  Pulse:  88  Resp:  18  Temp: 98 F (36.7 C)   SpO2:  100%    CONSTITUTIONAL:  non toxic-appearing, NAD NEURO:  Alert and oriented x 3, CN 3-12 grossly intact EYES:  eyes equal and reactive ENT/NECK:  Supple, no stridor  CARDIO:  normal rate, regular rhythm, appears well-perfused  PULM:  No respiratory distress, CTAB GI/GU:  non-distended,  MSK/SPINE:  No gross deformities, no edema, moves all extremities  SKIN:  no rash, atraumatic   *Additional and/or pertinent findings included in MDM below  Diagnostic and Interventional Summary    EKG Interpretation Date/Time:    Ventricular Rate:    PR Interval:    QRS Duration:    QT Interval:    QTC Calculation:   R Axis:      Text Interpretation:         Labs Reviewed  CBC WITH DIFFERENTIAL/PLATELET - Abnormal; Notable for the following components:      Result Value   Platelets 450 (*)    All other components within normal limits  COMPREHENSIVE METABOLIC PANEL WITH GFR - Abnormal; Notable for  the following components:   Sodium 132 (*)    Chloride 97 (*)    Glucose, Bld 102 (*)    Total Protein 8.8 (*)    AST 66 (*)    ALT 83 (*)    All other components within normal limits  RESP PANEL BY RT-PCR (RSV, FLU A&B, COVID)  RVPGX2  LIPASE, BLOOD  MAGNESIUM   RAPID URINE DRUG SCREEN, HOSP PERFORMED  ETHANOL    DG Chest Port 1 View  Final Result      Medications  LORazepam  (ATIVAN ) injection 0-4 mg ( Intravenous See Alternative 02/15/24 0126)    Or  LORazepam  (ATIVAN ) tablet 0-4 mg (1 mg Oral Given 02/15/24 0126)  LORazepam  (ATIVAN ) injection 0-4 mg (has no administration in time range)    Or  LORazepam  (ATIVAN ) tablet 0-4 mg (has no administration in time range)  thiamine  (VITAMIN B1) tablet 100 mg (has no administration in time range)    Or  thiamine  (VITAMIN B1) injection 100 mg (has no administration in time range)  naproxen  (NAPROSYN ) tablet 500 mg (500 mg Oral Given 02/14/24 2236)  LORazepam  (ATIVAN ) tablet 2 mg (2 mg Oral Given 02/14/24 2236)     Procedures  /  Critical Care Procedures  ED Course and Medical Decision Making  I have reviewed the triage vital signs, the  nursing notes, and pertinent available records from the EMR.  Social Determinants Affecting Complexity of Care: Patient has no clinically significant social determinants affecting this chief complaint..   ED Course:    Medical Decision Making Patient here with passive suicidal ideation and substance abuse problems.  States that he feels like he is withdrawing from heroin and cocaine.  He also states that he has been using alcohol.  States that he is a little bit shaky.  Denies nausea or vomiting.  States that he is trying to get back into a facility for detox.  Patient is medically clear for TTS and psychiatry evaluation.  Amount and/or Complexity of Data Reviewed Radiology: ordered.  Risk OTC drugs. Prescription drug management.         Consultants: TTS consult  pending.   Treatment and Plan: Dispo per TTS.  Inpatient psychiatric treatment recommended by TTS/psychiatry.    Final Clinical Impressions(s) / ED Diagnoses     ICD-10-CM   1. Polysubstance abuse (HCC)  F19.10     2. Suicidal ideation  R45.851       ED Discharge Orders     None         Discharge Instructions Discussed with and Provided to Patient:   Discharge Instructions   None      Vicky Charleston, PA-C 02/15/24 0602    Mesner, Selinda, MD 02/15/24 [phone]

## 2024-02-15 NOTE — Group Note (Signed)
 Group Topic: Social Support  Group Date: 02/15/2024 Start Time: 2000 End Time: 2030 Facilitators: Joan Plowman B  Department: [facility]  Number of Participants: 1  Group Focus: communication Treatment Modality:  Individual Therapy Interventions utilized were support Purpose: increase insight  Name: Jerry [name] Date of Birth: 05/15/1975  MR: [id number]    Level of Participation: PT DID NOT ATTEND GROUP   Patients Problems:  Patient Active Problem List   Diagnosis Date Noted   Alcohol use disorder, severe, dependence (HCC) 02/15/2024   Cocaine use disorder (HCC) 02/15/2024   Mild benzodiazepine use disorder (HCC) 02/06/2024   Protein-calorie malnutrition, severe 07/03/2023   Left arm cellulitis 07/01/2023   Jaw pain 11/29/2022   Need for influenza vaccination 11/29/2022   Erectile dysfunction 11/29/2022   Hyponatremia 11/29/2022   History of hepatitis C virus infection 11/29/2022   Tardive dyskinesia 10/24/2022   Generalized anxiety disorder 10/24/2022   PTSD (post-traumatic stress disorder) 10/24/2022   Healthcare maintenance 10/24/2022   Schizophrenia (HCC) 07/07/2022   Malingering 09/07/2020   Homelessness 09/07/2020   Schizophrenia spectrum disorder with psychotic disorder type not yet determined (HCC) 07/17/2020   Methamphetamine abuse (HCC) 07/17/2020   Marijuana abuse 07/17/2020   Cocaine abuse (HCC) 07/17/2020   Anxiety and depression 06/25/2014   Family history of diabetes mellitus (DM) 06/25/2014   Family history of thyroid  disease 06/25/2014   Tobacco use disorder 06/25/2014   Alcohol abuse 05/18/2013   Polysubstance abuse (HCC) 05/18/2013   Depression, unspecified 05/18/2013

## 2024-02-15 NOTE — Discharge Instructions (Addendum)
 It was a pleasure caring for you today in the emergency department.  Please return to the emergency department for any worsening or worrisome symptoms.    RESOURCE GUIDE  Chronic Pain Problems: Contact [facility]  [phone] Patients need to be referred by their primary care doctor.  Insufficient Money for Medicine: Contact [employer]:  call [phone]  No Primary Care Doctor: Call [facility]  [phone] - can help you locate a primary care doctor that  accepts your insurance, provides certain services, etc. Physician Referral Service- [phone]  Agencies that provide inexpensive medical care: [facility]  [phone] [facility]  [phone] [facility]  [phone] [facility]  [phone] [facility]  [phone] [facility]  [phone]  Medicaid-accepting [facility] Providers: [facility]- [address]  [phone], Mon-Fri 9am-7pm, Sat 9am-1pm [facility]- [address] [place], Suite [place]  [phone] [facility]- [address] [place]  [phone] [facility]- [address]  [phone] Jonathon Neighbors- [address] [place], [address], [phone]  Only accepts [place] patients after they have their name  applied to their card  Self Pay (no insurance) in [facility]: Sickle Cell Patients - [facility]  [address] [place], [phone] [facility]- [address] [place]  [phone]       [facility] [place]- [address]       -     [facility]- see information above (Speak to [employer] if you do not have insurance)       -  [facility]- [address],  [phone]       -  [facility]- [address] [phone]       -  Dr Sherlene Diss-  [address], [place], [phone]       -   Urgent Medical and [facility] - [address] [phone]       -  [facility]- [address], [phone], also [address] [phone]       -     [facility]- [address] [place], [phone], 1st & 3rd Saturday         every month, 10am-1pm  -     [facility]   [address] [place], [place].   Phone:  [phone], Fax:  [phone]. Hours of Operation:  9 am - 6 pm, M-F.  -     [facility]   [address], [place]   Phone: [phone], Fax: [phone]. Hours of Operation:  8:30 am - 5:30 pm, M-F.    Dental Assistance If unable to pay or uninsured, contact:  [facility]. to become qualified for the adult dental clinic.  Patients with Medicaid: [facility] [phone] [address], [phone] [address] [phone]  If unable to pay, or uninsured, contact [facility] [phone] in [place], [phone] in [facility]) to become qualified for the adult dental clinic  [facility] [address] [place], [place] [address] [phone] [email or web address]  Other Proofreader Services: [facility]- [address] [place], [place], [address], [phone], Ext. [phone], 2nd  and 4th Thursday of the month at 6:30am.  10 clients each day by appointment, can sometimes see walk-in patients if someone does not show for an appointment. [facility]- [address] [place], [place], [address], [phone] [facility] [address] [place], [place], [address], [phone] [facility] Department- [phone] [facility] Health Department- [phone] [facility] [phone]

## 2024-02-15 NOTE — Progress Notes (Signed)
 Patient is currently resting with eyes closed at this time.  No acute distress noted.  Respirations present, even and unlabored.  Will continue Q 15 min safety checks for safety/behavior.

## 2024-02-15 NOTE — ED Notes (Signed)
 During CIWA assessment, pt reports hearing whispers, when asked what pt was hearing he states that ill be shot in the back of the head. Pt A&Ox4 at this time.
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
## 2024-02-15 NOTE — Progress Notes (Addendum)
 Patient is alert, oriented with no acute distress noted.  Patient denies SI/HI, however would not confirm or deny AH/VH at this time.  Patient observed withdrawn in his room during the shift with no behaviors noted.  No issues noted or concerns noted at this time.  Patient refused scheduled medication during shift.  Will continue Q 15 min safety checks for safety/behavior.

## 2024-02-16 DIAGNOSIS — R45851 Suicidal ideations: Secondary | ICD-10-CM | POA: Diagnosis not present

## 2024-02-16 DIAGNOSIS — G4700 Insomnia, unspecified: Secondary | ICD-10-CM | POA: Diagnosis not present

## 2024-02-16 DIAGNOSIS — Z560 Unemployment, unspecified: Secondary | ICD-10-CM | POA: Diagnosis not present

## 2024-02-16 DIAGNOSIS — F1220 Cannabis dependence, uncomplicated: Secondary | ICD-10-CM | POA: Diagnosis not present

## 2024-02-16 MED ORDER — VITAMIN D 25 MCG (1000 UNIT) PO TABS
2000.0000 [IU] | ORAL_TABLET | Freq: Every day | ORAL | Status: DC
  Administered 2024-02-16 – 2024-02-19 (×4): 2000 [IU] via ORAL
  Filled 2024-02-16 (×4): qty 2

## 2024-02-16 NOTE — ED Notes (Signed)
 Patient is in the dayroom calm and composed eating snacks. NAD. Denies SI/HI/AVH. Environment secured per policy. Will monitor for safety.

## 2024-02-16 NOTE — Group Note (Signed)
 Group Topic: Emotional Regulation  Group Date: 02/16/2024 Start Time: 1640 End Time: 1720 Facilitators: Elnor Keven SAILOR  Department: [facility]  Number of Participants: 7  Group Focus: coping skills Treatment Modality:  Psychoeducation Interventions utilized were patient education Purpose: reinforce self-care  Name: Jerry [name] Date of Birth: 30-Aug-1975  MR: [id number]    Level of Participation: minimal Quality of Participation: engaged Interactions with others: gave feedback Mood/Affect: appropriate Triggers (if applicable): NA Cognition: coherent/clear Progress: Moderate Response: Discussed importance of grounding techniques in times of difficulty. Participated in grounding exercise and scavenger hunt to find sensory items.  Plan: follow-up needed  Patients Problems:  Patient Active Problem List   Diagnosis Date Noted   Alcohol use disorder, severe, dependence (HCC) 02/15/2024   Cocaine use disorder (HCC) 02/15/2024   Mild benzodiazepine use disorder (HCC) 02/06/2024   Protein-calorie malnutrition, severe 07/03/2023   Left arm cellulitis 07/01/2023   Jaw pain 11/29/2022   Need for influenza vaccination 11/29/2022   Erectile dysfunction 11/29/2022   Hyponatremia 11/29/2022   History of hepatitis C virus infection 11/29/2022   Tardive dyskinesia 10/24/2022   Generalized anxiety disorder 10/24/2022   PTSD (post-traumatic stress disorder) 10/24/2022   Healthcare maintenance 10/24/2022   Schizophrenia (HCC) 07/07/2022   Malingering 09/07/2020   Homelessness 09/07/2020   Schizophrenia spectrum disorder with psychotic disorder type not yet determined (HCC) 07/17/2020   Methamphetamine abuse (HCC) 07/17/2020   Marijuana abuse 07/17/2020   Cocaine abuse (HCC) 07/17/2020   Anxiety and depression 06/25/2014   Family history of diabetes mellitus (DM) 06/25/2014   Family history of thyroid  disease 06/25/2014   Tobacco use disorder 06/25/2014    Alcohol abuse 05/18/2013   Polysubstance abuse (HCC) 05/18/2013   Depression, unspecified 05/18/2013

## 2024-02-16 NOTE — ED Notes (Signed)
 Patient resting with eyes closed in no apparent acute distress. Respirations even and unlabored. Environment secured. Safety checks in place according to facility policy.

## 2024-02-16 NOTE — Group Note (Signed)
 Group Topic: Wellness  Group Date: 02/16/2024 Start Time: 1300 End Time: 1345 Facilitators: Lawrnce Garvin PARAS, MD; Roberta Strieter, Zane HERO, RN  Department: [facility]  Number of Participants: 5  Group Focus: MD group Treatment Modality:  Interpersonal Therapy and Psychodynamic Psychotherapy Interventions utilized were assignment, exploration, group exercise, and patient education Purpose: increase insight, express feelings  Name: Jerry [name] Date of Birth: 01/17/1976  MR: [id number]    Level of Participation: did not attend Quality of Participation:  Interactions with others:  Mood/Affect:  Triggers (if applicable):  Cognition:  Progress: None Response:  Plan: patient will be encouraged to attend future groups/programming on the unit  Patients Problems:  Patient Active Problem List   Diagnosis Date Noted   Alcohol use disorder, severe, dependence (HCC) 02/15/2024   Cocaine use disorder (HCC) 02/15/2024   Mild benzodiazepine use disorder (HCC) 02/06/2024   Protein-calorie malnutrition, severe 07/03/2023   Left arm cellulitis 07/01/2023   Jaw pain 11/29/2022   Need for influenza vaccination 11/29/2022   Erectile dysfunction 11/29/2022   Hyponatremia 11/29/2022   History of hepatitis C virus infection 11/29/2022   Tardive dyskinesia 10/24/2022   Generalized anxiety disorder 10/24/2022   PTSD (post-traumatic stress disorder) 10/24/2022   Healthcare maintenance 10/24/2022   Schizophrenia (HCC) 07/07/2022   Malingering 09/07/2020   Homelessness 09/07/2020   Schizophrenia spectrum disorder with psychotic disorder type not yet determined (HCC) 07/17/2020   Methamphetamine abuse (HCC) 07/17/2020   Marijuana abuse 07/17/2020   Cocaine abuse (HCC) 07/17/2020   Anxiety and depression 06/25/2014   Family history of diabetes mellitus (DM) 06/25/2014   Family history of thyroid  disease 06/25/2014   Tobacco use disorder 06/25/2014   Alcohol abuse  05/18/2013   Polysubstance abuse (HCC) 05/18/2013   Depression, unspecified 05/18/2013

## 2024-02-16 NOTE — ED Notes (Signed)
 Patient is in the bedroom calm and sleeping. NAD Respirations even and unlabored. Environment secured. Will continue to monitor for safety.

## 2024-02-16 NOTE — Group Note (Signed)
 Group Topic: Relapse and Recovery  Group Date: 02/16/2024 Start Time: 2000 End Time: 2100 Facilitators: Joan Plowman B  Department: [facility]  Number of Participants: 4  Group Focus: chemical dependency education Treatment Modality:  Psychoeducation and Spiritual Interventions utilized were patient education Purpose: enhance coping skills and relapse prevention strategies  Name: Jerry [name] Date of Birth: April 14, 1975  MR: [id number]    Level of Participation: PT DID NOT ATTEND GROUPS   Patients Problems:  Patient Active Problem List   Diagnosis Date Noted  . Alcohol use disorder, severe, dependence (HCC) 02/15/2024  . Cocaine use disorder (HCC) 02/15/2024  . Mild benzodiazepine use disorder (HCC) 02/06/2024  . Protein-calorie malnutrition, severe 07/03/2023  . Left arm cellulitis 07/01/2023  . Jaw pain 11/29/2022  . Need for influenza vaccination 11/29/2022  . Erectile dysfunction 11/29/2022  . Hyponatremia 11/29/2022  . History of hepatitis C virus infection 11/29/2022  . Tardive dyskinesia 10/24/2022  . Generalized anxiety disorder 10/24/2022  . PTSD (post-traumatic stress disorder) 10/24/2022  . Healthcare maintenance 10/24/2022  . Schizophrenia (HCC) 07/07/2022  . Malingering 09/07/2020  . Homelessness 09/07/2020  . Schizophrenia spectrum disorder with psychotic disorder type not yet determined (HCC) 07/17/2020  . Methamphetamine abuse (HCC) 07/17/2020  . Marijuana abuse 07/17/2020  . Cocaine abuse (HCC) 07/17/2020  . Anxiety and depression 06/25/2014  . Family history of diabetes mellitus (DM) 06/25/2014  . Family history of thyroid  disease 06/25/2014  . Tobacco use disorder 06/25/2014  . Alcohol abuse 05/18/2013  . Polysubstance abuse (HCC) 05/18/2013  . Depression, unspecified 05/18/2013

## 2024-02-16 NOTE — ED Provider Notes (Signed)
 Facility Based Crisis [facility]) Behavioral Health Progress Note  Date and Time: 02/16/2024 10:20 AM Name: Jerry [name] MRN:  [id number]  Subjective:  I need help   Diagnosis:  Final diagnoses:  Polysubstance abuse [facility])   Chart reviewed with attending psychiatrist, Dr Garvin Gaines.  Pt is seen face-to-face on the Facility Based Crisis [facility]) unit.  He is here as a voluntary admission.  Pt is alert & oriented x 4 and engages in evaluation. States he came in because I need help. He was discharged from [facility] on 02/08/2024. Pt was accepted to 30 day residential program at [facility], however never arrived at [facility] to begin the program. States I has two $10 bills in my pocket and I jumped out of the car going to [facility] and went and got high. He endorses a return to use immediately post hospital discharge; I went and got high, I used everything except for meth. UDS on 02/15/2024 POS for benzodiazepines, cocaine and THC. He states last cocaine use was two days ago via smoking. States he did not remain on mental health medications after hospital discharge because I lost the medicine. He endorses auditory hallucinations of whispers. Haldol  5 mg PO BID was started on 02/15/2024 to target AH. Denies VH and HI. He states he was endorsing SI prior to arrival; I wanted to run out in the road. He denies suicidal ideation, intent or plan during this assessment.  He endorses sleeping all right.  He endorses a fair appetite due to available food options on the unit.  CIWA score 3 at 0600. He states he wishes to pursue residential substance use treatment and is open to [facility] and states he will go if he is accepted.  A referral to [facility] care management will be made to assist with residential placement.   Total Time spent with patient: 15 minutes  Past Psychiatric History:  Schizophrenia,  has a past medical history of Anxiety, Bipolar 1 disorder (HCC), Depression,  EtOH dependence (HCC), Hallucination, Paranoid  schizophrenia (HCC), and PTSD (post-traumatic stress disorder).  Past Medical History: Drug-induced seizure Family History: family history includes Cancer in his mother; Diabetes in his father and maternal grandmother; Heart disease in his father and mother; Lung cancer in his mother; Stroke in his paternal grandfather.  Family Psychiatric  History: none reported Social History: Unemployed. Housing unstable.   Additional Social History:   Sleep: Good  Appetite:  Fair  Current Medications:  Current Facility-Administered Medications  Medication Dose Route Frequency Provider Last Rate Last Admin   acetaminophen  (TYLENOL ) tablet 650 mg  650 mg Oral Q6H PRN Mannie Jerel PARAS, NP   650 mg at 02/16/24 1229   alum & mag hydroxide-simeth (MAALOX/MYLANTA) 200-200-20 MG/5ML suspension 30 mL  30 mL Oral Q4H PRN Mannie Jerel PARAS, NP       haloperidol  (HALDOL ) tablet 5 mg  5 mg Oral TID PRN Mannie Jerel PARAS, NP       And   diphenhydrAMINE  (BENADRYL ) capsule 50 mg  50 mg Oral TID PRN Mannie Jerel PARAS, NP       haloperidol  lactate (HALDOL ) injection 5 mg  5 mg Intramuscular TID PRN Mannie Jerel PARAS, NP       And   diphenhydrAMINE  (BENADRYL ) injection 50 mg  50 mg Intramuscular TID PRN Mannie Jerel PARAS, NP       And   LORazepam  (ATIVAN ) injection 2 mg  2 mg Intramuscular TID PRN Mannie Jerel PARAS, NP  haloperidol  lactate (HALDOL ) injection 10 mg  10 mg Intramuscular TID PRN Mannie Jerel PARAS, NP       And   diphenhydrAMINE  (BENADRYL ) injection 50 mg  50 mg Intramuscular TID PRN Mannie Jerel PARAS, NP       And   LORazepam  (ATIVAN ) injection 2 mg  2 mg Intramuscular TID PRN Mannie Jerel PARAS, NP       haloperidol  (HALDOL ) tablet 5 mg  5 mg Oral BID Paliy, Alisa, MD   5 mg at 02/16/24 9057   hydrOXYzine  (ATARAX ) tablet 25 mg  25 mg Oral Q6H PRN Mannie Jerel PARAS, NP   25 mg at 02/16/24 1229   loperamide  (IMODIUM ) capsule 2-4 mg  2-4 mg Oral PRN Mannie Jerel PARAS, NP       LORazepam  (ATIVAN ) tablet 1 mg  1  mg Oral Q6H PRN Mannie Jerel PARAS, NP       magnesium  hydroxide (MILK OF MAGNESIA) suspension 30 mL  30 mL Oral Daily PRN Mannie Jerel PARAS, NP       multivitamin with minerals tablet 1 tablet  1 tablet Oral Daily Mannie Jerel PARAS, NP   1 tablet at 02/16/24 9057   nicotine  (NICODERM CQ  - dosed in mg/24 hours) patch 21 mg  21 mg Transdermal Q0600 Mannie Jerel PARAS, NP       ondansetron  (ZOFRAN -ODT) disintegrating tablet 4 mg  4 mg Oral Q6H PRN Mannie Jerel PARAS, NP   4 mg at 02/16/24 1230   thiamine  (VITAMIN B1) tablet 100 mg  100 mg Oral Daily Mannie Jerel PARAS, NP   100 mg at 02/16/24 9057   Current Outpatient Medications  Medication Sig Dispense Refill   cholecalciferol  (CHOLECALCIFEROL ) 25 MCG tablet Take 1 tablet (1,000 Units total) by mouth daily. (Patient not taking: Reported on 02/15/2024) 30 tablet 0   haloperidol  (HALDOL ) 5 MG tablet Take 1 tablet (5 mg total) by mouth 2 (two) times daily. (Patient not taking: Reported on 02/15/2024) 60 tablet 0   Multiple Vitamin (MULTIVITAMIN WITH MINERALS) TABS tablet Take 1 tablet by mouth daily. (Patient not taking: Reported on 02/15/2024) 30 tablet 0   naltrexone  (DEPADE) 50 MG tablet Take 1 tablet (50 mg total) by mouth daily. (Patient not taking: Reported on 02/15/2024) 30 tablet 0   nicotine  (NICODERM CQ  - DOSED IN MG/24 HOURS) 14 mg/24hr patch Place 1 patch (14 mg total) onto the skin daily. (Patient not taking: Reported on 02/15/2024) 30 patch 0    Labs  Lab Results:  Admission on 02/14/2024, Discharged on 02/15/2024  Component Date Value Ref Range Status   WBC 02/14/2024 8.0  4.0 - 10.5 K/uL Final   RBC 02/14/2024 4.57  4.22 - 5.81 MIL/uL Final   Hemoglobin 02/14/2024 14.3  13.0 - 17.0 g/dL Final   HCT 88/72/7974 42.3  39.0 - 52.0 % Final   MCV 02/14/2024 92.6  80.0 - 100.0 fL Final   MCH 02/14/2024 31.3  26.0 - 34.0 pg Final   MCHC 02/14/2024 33.8  30.0 - 36.0 g/dL Final   RDW 88/72/7974 13.7  11.5 - 15.5 % Final   Platelets 02/14/2024  450 (H)  150 - 400 K/uL Final   nRBC 02/14/2024 0.0  0.0 - 0.2 % Final   Neutrophils Relative % 02/14/2024 47  % Final   Neutro Abs 02/14/2024 3.7  1.7 - 7.7 K/uL Final   Lymphocytes Relative 02/14/2024 35  % Final   Lymphs Abs 02/14/2024 2.8  0.7 - 4.0 K/uL Final  Monocytes Relative 02/14/2024 13  % Final   Monocytes Absolute 02/14/2024 1.0  0.1 - 1.0 K/uL Final   Eosinophils Relative 02/14/2024 4  % Final   Eosinophils Absolute 02/14/2024 0.3  0.0 - 0.5 K/uL Final   Basophils Relative 02/14/2024 1  % Final   Basophils Absolute 02/14/2024 0.1  0.0 - 0.1 K/uL Final   Immature Granulocytes 02/14/2024 0  % Final   Abs Immature Granulocytes 02/14/2024 0.02  0.00 - 0.07 K/uL Final   Performed at [facility], [address], [place], [place] [address]   Sodium 02/14/2024 132 (L)  135 - 145 mmol/L Final   Potassium 02/14/2024 3.7  3.5 - 5.1 mmol/L Final   Chloride 02/14/2024 97 (L)  98 - 111 mmol/L Final   CO2 02/14/2024 24  22 - 32 mmol/L Final   Glucose, Bld 02/14/2024 102 (H)  70 - 99 mg/dL Final   Glucose reference range applies only to samples taken after fasting for at least 8 hours.   BUN 02/14/2024 6  6 - 20 mg/dL Final   Creatinine, Ser 02/14/2024 0.86  0.61 - 1.24 mg/dL Final   Calcium 88/72/7974 9.4  8.9 - 10.3 mg/dL Final   Total Protein 88/72/7974 8.8 (H)  6.5 - 8.1 g/dL Final   Albumin 88/72/7974 3.5  3.5 - 5.0 g/dL Final   AST 88/72/7974 66 (H)  15 - 41 U/L Final   ALT 02/14/2024 83 (H)  0 - 44 U/L Final   Alkaline Phosphatase 02/14/2024 67  38 - 126 U/L Final   Total Bilirubin 02/14/2024 1.1  0.0 - 1.2 mg/dL Final   GFR, Estimated 02/14/2024 >60  >60 mL/min Final   Comment: (NOTE) Calculated using the CKD-EPI Creatinine Equation (2021)    Anion gap 02/14/2024 11  5 - 15 Final   Performed at [facility], [address] [place], [place] [address]   Lipase 02/14/2024 27  11 - 51 U/L Final   Performed at [facility], [address] [place],  [place] [address]   Magnesium  02/14/2024 1.9  1.7 - 2.4 mg/dL Final   Performed at [facility], [address] [place], [place] [address]   SARS Coronavirus 2 by RT PCR 02/15/2024 NEGATIVE  NEGATIVE Final   Influenza A by PCR 02/15/2024 NEGATIVE  NEGATIVE Final   Influenza B by PCR 02/15/2024 NEGATIVE  NEGATIVE Final   Comment: (NOTE) The Xpert Xpress SARS-CoV-2/FLU/RSV plus assay is intended as an aid in the diagnosis of influenza from Nasopharyngeal swab specimens and should not be used as a sole basis for treatment. Nasal washings and aspirates are unacceptable for Xpert Xpress SARS-CoV-2/FLU/RSV testing.  Fact Sheet for Patients: bloggercourse.com  Fact Sheet for Healthcare Providers: seriousbroker.it  This test is not yet approved or cleared by the [place]  FDA and has been authorized for detection and/or diagnosis of SARS-CoV-2 by FDA under an Emergency Use Authorization (EUA). This EUA will remain in effect (meaning this test can be used) for the duration of the COVID-19 declaration under Section 564(b)(1) of the Act, 21 U.S.C. section 360bbb-3(b)(1), unless the authorization is terminated or revoked.     Resp Syncytial Virus by PCR 02/15/2024 NEGATIVE  NEGATIVE Final   Comment: (NOTE) Fact Sheet for Patients: bloggercourse.com  Fact Sheet for Healthcare Providers: seriousbroker.it  This test is not yet approved or cleared by the [place]  FDA and has been authorized for detection and/or diagnosis of SARS-CoV-2 by FDA under  an Emergency Use Authorization (EUA). This EUA will remain in effect (meaning this test can be used) for the duration of the COVID-19 declaration under Section 564(b)(1) of the Act, 21 U.S.C. section 360bbb-3(b)(1), unless the authorization is terminated or revoked.  Performed at [facility], [address] [place],  [place] [address]    Opiates 02/15/2024 NONE DETECTED  NONE DETECTED Final   Cocaine 02/15/2024 POSITIVE (A)  NONE DETECTED Final   Benzodiazepines 02/15/2024 POSITIVE (A)  NONE DETECTED Final   Amphetamines 02/15/2024 NONE DETECTED  NONE DETECTED Final   Tetrahydrocannabinol 02/15/2024 POSITIVE (A)  NONE DETECTED Final   Barbiturates 02/15/2024 NONE DETECTED  NONE DETECTED Final   Comment: (NOTE) DRUG SCREEN FOR MEDICAL PURPOSES ONLY.  IF CONFIRMATION IS NEEDED FOR ANY PURPOSE, NOTIFY LAB WITHIN 5 DAYS.  LOWEST DETECTABLE LIMITS FOR URINE DRUG SCREEN Drug Class                     Cutoff (ng/mL) Amphetamine and metabolites    1000 Barbiturate and metabolites    200 Benzodiazepine                 200 Opiates and metabolites        300 Cocaine and metabolites        300 THC                            50 Performed at [facility] Lab, [address], [place], [place] [address]    Color, Urine 02/15/2024 YELLOW  YELLOW Final   APPearance 02/15/2024 CLEAR  CLEAR Final   Specific Gravity, Urine 02/15/2024 1.008  1.005 - 1.030 Final   pH 02/15/2024 7.0  5.0 - 8.0 Final   Glucose, UA 02/15/2024 NEGATIVE  NEGATIVE mg/dL Final   Hgb urine dipstick 02/15/2024 NEGATIVE  NEGATIVE Final   Bilirubin Urine 02/15/2024 NEGATIVE  NEGATIVE Final   Ketones, ur 02/15/2024 NEGATIVE  NEGATIVE mg/dL Final   Protein, ur 88/71/7974 NEGATIVE  NEGATIVE mg/dL Final   Nitrite 88/71/7974 NEGATIVE  NEGATIVE Final   Leukocytes,Ua 02/15/2024 NEGATIVE  NEGATIVE Final   Performed at [facility], [address], [place], [place] [address]  Admission on 02/03/2024, Discharged on 02/08/2024  Component Date Value Ref Range Status   Folate 02/05/2024 12.5  >5.9 ng/mL Final   Comment: HEMOLYSIS AT THIS LEVEL MAY AFFECT RESULT Performed at [facility], [address], [place], [place] [address]    Hgb A1c MFr Bld 02/05/2024 5.1  4.8 - 5.6 % Final   Comment: (NOTE) Diagnosis of Diabetes The  following HbA1c ranges recommended by the American Diabetes Association (ADA) may be used as an aid in the diagnosis of diabetes mellitus.  Hemoglobin             Suggested A1C NGSP%              Diagnosis  <5.7                   Non Diabetic  5.7-6.4                Pre-Diabetic  >6.4                   Diabetic  <7.0                   Glycemic control for  adults with diabetes.     Mean Plasma Glucose 02/05/2024 99.67  mg/dL Final   Performed at [facility], [address] [place], [place] [address]   Cholesterol 02/05/2024 154  0 - 200 mg/dL Final   Comment:        ATP III CLASSIFICATION:  <200     mg/dL   Desirable  799-760  mg/dL   Borderline High  >=759    mg/dL   High           Triglycerides 02/05/2024 220 (H)  <150 mg/dL Final   HDL 88/81/7974 30 (L)  >40 mg/dL Final   Total CHOL/HDL Ratio 02/05/2024 5.1  RATIO Final   VLDL 02/05/2024 44 (H)  0 - 40 mg/dL Final   LDL Cholesterol 02/05/2024 80  0 - 99 mg/dL Final   Comment:        Total Cholesterol/HDL:CHD Risk Coronary Heart Disease Risk Table                     Men   Women  1/2 Average Risk   3.4   3.3  Average Risk       5.0   4.4  2 X Average Risk   9.6   7.1  3 X Average Risk  23.4   11.0        Use the calculated Patient Ratio above and the CHD Risk Table to determine the patient's CHD Risk.        ATP III CLASSIFICATION (LDL):  <100     mg/dL   Optimal  899-870  mg/dL   Near or Above                    Optimal  130-159  mg/dL   Borderline  839-810  mg/dL   High  >809     mg/dL   Very High Performed at [facility], [address], [place], [place] [address]    TSH 02/05/2024 1.110  0.350 - 4.500 uIU/mL Final   Performed at [facility], [address], [place], [place] [address]   Vitamin B-12 02/05/2024 517  180 - 914 pg/mL Final   Performed at [facility], [address] [place], [place] [address]   Vit D,  25-Hydroxy 02/05/2024 28.74 (L)  30 - 100 ng/mL Final   Comment: (NOTE) Vitamin D  deficiency has been defined by the Institute of Medicine  and an Endocrine Society practice guideline as a level of serum 25-OH  vitamin D  less than 20 ng/mL (1,2). The Endocrine Society went on to  further define vitamin D  insufficiency as a level between 21 and 29  ng/mL (2).  1. IOM (Institute of Medicine). 2010. Dietary reference intakes for  calcium and D. [place]: [employer]. 2. Holick MF, Bink[place], Bischoff-Ferrari HA, et al. Evaluation,  treatment, and prevention of vitamin D  deficiency: an Endocrine  Society clinical practice guideline, JCEM. 2011 Jul; 96(7): 1911-30.  Performed at [facility], [address] [place], [place] [address]   Admission on 02/02/2024, Discharged on 02/03/2024  Component Date Value Ref Range Status   Sodium 02/03/2024 135  135 - 145 mmol/L Final   Potassium 02/03/2024 4.0  3.5 - 5.1 mmol/L Final   Chloride 02/03/2024 102  98 - 111 mmol/L Final   CO2 02/03/2024 23  22 - 32 mmol/L Final   Glucose, Bld 02/03/2024 110 (H)  70 - 99 mg/dL Final   Glucose reference range applies only to samples taken after fasting for at least 8 hours.   BUN 02/03/2024 6  6 - 20 mg/dL Final   Creatinine, Ser 02/03/2024 0.73  0.61 - 1.24 mg/dL Final   Calcium 88/83/7974 9.0  8.9 - 10.3 mg/dL Final   Total Protein 88/83/7974 7.5  6.5 - 8.1 g/dL Final   Albumin 88/83/7974 3.0 (L)  3.5 - 5.0 g/dL Final   AST 88/83/7974 72 (H)  15 - 41 U/L Final   ALT 02/03/2024 87 (H)  0 - 44 U/L Final   Alkaline Phosphatase 02/03/2024 60  38 - 126 U/L Final   Total Bilirubin 02/03/2024 1.0  0.0 - 1.2 mg/dL Final   GFR, Estimated 02/03/2024 >60  >60 mL/min Final   Comment: (NOTE) Calculated using the CKD-EPI Creatinine Equation (2021)    Anion gap 02/03/2024 10  5 - 15 Final   Performed at [facility], [address] [place], [place] [address]   WBC 02/03/2024 7.1   4.0 - 10.5 K/uL Final   RBC 02/03/2024 4.23  4.22 - 5.81 MIL/uL Final   Hemoglobin 02/03/2024 13.2  13.0 - 17.0 g/dL Final   HCT 88/83/7974 39.6  39.0 - 52.0 % Final   MCV 02/03/2024 93.6  80.0 - 100.0 fL Final   MCH 02/03/2024 31.2  26.0 - 34.0 pg Final   MCHC 02/03/2024 33.3  30.0 - 36.0 g/dL Final   RDW 88/83/7974 14.4  11.5 - 15.5 % Final   Platelets 02/03/2024 408 (H)  150 - 400 K/uL Final   nRBC 02/03/2024 0.0  0.0 - 0.2 % Final   Neutrophils Relative % 02/03/2024 57  % Final   Neutro Abs 02/03/2024 4.1  1.7 - 7.7 K/uL Final   Lymphocytes Relative 02/03/2024 22  % Final   Lymphs Abs 02/03/2024 1.5  0.7 - 4.0 K/uL Final   Monocytes Relative 02/03/2024 15  % Final   Monocytes Absolute 02/03/2024 1.1 (H)  0.1 - 1.0 K/uL Final   Eosinophils Relative 02/03/2024 5  % Final   Eosinophils Absolute 02/03/2024 0.3  0.0 - 0.5 K/uL Final   Basophils Relative 02/03/2024 1  % Final   Basophils Absolute 02/03/2024 0.1  0.0 - 0.1 K/uL Final   Immature Granulocytes 02/03/2024 0  % Final   Abs Immature Granulocytes 02/03/2024 0.02  0.00 - 0.07 K/uL Final   Performed at [facility], [address] [place], [place] [address]   Alcohol, Ethyl (B) 02/03/2024 <15  <15 mg/dL Final   Comment: (NOTE) For medical purposes only. Performed at [facility], [address] [place], [place] [address]    Color, Urine 02/02/2024 AMBER (A)  YELLOW Final   BIOCHEMICALS MAY BE AFFECTED BY COLOR   APPearance 02/02/2024 CLEAR  CLEAR Final   Specific Gravity, Urine 02/02/2024 1.024  1.005 - 1.030 Final   pH 02/02/2024 6.0  5.0 - 8.0 Final   Glucose, UA 02/02/2024 NEGATIVE  NEGATIVE mg/dL Final   Hgb urine dipstick 02/02/2024 NEGATIVE  NEGATIVE Final   Bilirubin Urine 02/02/2024 NEGATIVE  NEGATIVE Final   Ketones, ur 02/02/2024 NEGATIVE  NEGATIVE mg/dL Final   Protein, ur 88/84/7974 100 (A)  NEGATIVE mg/dL Final   Nitrite 88/84/7974 NEGATIVE  NEGATIVE Final   Leukocytes,Ua 02/02/2024 NEGATIVE   NEGATIVE Final   RBC / HPF 02/02/2024 0-5  0 - 5 RBC/hpf Final   WBC, UA 02/02/2024 0-5  0 - 5 WBC/hpf Final   Bacteria, UA 02/02/2024 RARE (A)  NONE SEEN Final   Squamous Epithelial / HPF 02/02/2024 0-5  0 - 5 /HPF Final   Mucus 02/02/2024 PRESENT   Final   Performed at [facility], [address], [place], [place] [address]   POC Amphetamine UR 02/02/2024 Positive (A)  NONE DETECTED (Cut Off Level 1000 ng/mL) Final   POC Secobarbital (BAR) 02/02/2024 None Detected  NONE DETECTED (Cut Off Level 300 ng/mL) Final   POC Buprenorphine (BUP) 02/02/2024 None Detected  NONE DETECTED (Cut Off Level 10 ng/mL) Final   POC Oxazepam (BZO) 02/02/2024 Positive (A)  NONE DETECTED (Cut Off Level 300 ng/mL) Final   POC Cocaine UR 02/02/2024 Positive (A)  NONE DETECTED (Cut Off Level 300 ng/mL) Final   POC Methamphetamine UR 02/02/2024 Positive (A)  NONE DETECTED (Cut Off Level 1000 ng/mL) Final   POC Morphine 02/02/2024 None Detected  NONE DETECTED (Cut Off Level 300 ng/mL) Final   POC Methadone UR 02/02/2024 None Detected  NONE DETECTED (Cut Off Level 300 ng/mL) Final   POC Oxycodone  UR 02/02/2024 None Detected  NONE DETECTED (Cut Off Level 100 ng/mL) Final   POC Marijuana UR 02/02/2024 Positive (A)  NONE DETECTED (Cut Off Level 50 ng/mL) Final  Admission on 01/13/2024, Discharged on 01/14/2024  Component Date Value Ref Range Status   Sodium 01/13/2024 137  135 - 145 mmol/L Final   Potassium 01/13/2024 3.4 (L)  3.5 - 5.1 mmol/L Final   Chloride 01/13/2024 100  98 - 111 mmol/L Final   CO2 01/13/2024 23  22 - 32 mmol/L Final   Glucose, Bld 01/13/2024 90  70 - 99 mg/dL Final   Glucose reference range applies only to samples taken after fasting for at least 8 hours.   BUN 01/13/2024 9  6 - 20 mg/dL Final   Creatinine, Ser 01/13/2024 0.80  0.61 - 1.24 mg/dL Final   Calcium 89/73/7974 8.6 (L)  8.9 - 10.3 mg/dL Final   Total Protein 89/73/7974 7.8  6.5 - 8.1 g/dL Final   Albumin 89/73/7974 3.3 (L)  3.5  - 5.0 g/dL Final   AST 89/73/7974 52 (H)  15 - 41 U/L Final   ALT 01/13/2024 83 (H)  0 - 44 U/L Final   Alkaline Phosphatase 01/13/2024 55  38 - 126 U/L Final   Total Bilirubin 01/13/2024 0.7  0.0 - 1.2 mg/dL Final   GFR, Estimated 01/13/2024 >60  >60 mL/min Final   Comment: (NOTE) Calculated using the CKD-EPI Creatinine Equation (2021)    Anion gap 01/13/2024 14  5 - 15 Final   Performed at [facility], [address], [place], [place] [address]   Alcohol, Ethyl (B) 01/13/2024 <15  <15 mg/dL Final   Comment: (NOTE) For medical purposes only. Performed at [facility], [address] [place], [place] [address]    WBC 01/13/2024 7.1  4.0 - 10.5 K/uL Final   RBC 01/13/2024 4.05 (L)  4.22 - 5.81 MIL/uL Final   Hemoglobin 01/13/2024 12.7 (L)  13.0 - 17.0 g/dL Final   HCT 89/73/7974 38.0 (L)  39.0 - 52.0 % Final   MCV 01/13/2024 93.8  80.0 - 100.0 fL Final   MCH 01/13/2024 31.4  26.0 - 34.0 pg Final   MCHC 01/13/2024 33.4  30.0 - 36.0 g/dL Final   RDW 89/73/7974 14.1  11.5 - 15.5 % Final   Platelets 01/13/2024 426 (H)  150 - 400  K/uL Final   nRBC 01/13/2024 0.0  0.0 - 0.2 % Final   Performed at [facility], [address] [place], [place] [address]   SARS Coronavirus 2 by RT PCR 01/13/2024 NEGATIVE  NEGATIVE Final   Influenza A by PCR 01/13/2024 NEGATIVE  NEGATIVE Final   Influenza B by PCR 01/13/2024 NEGATIVE  NEGATIVE Final   Comment: (NOTE) The Xpert Xpress SARS-CoV-2/FLU/RSV plus assay is intended as an aid in the diagnosis of influenza from Nasopharyngeal swab specimens and should not be used as a sole basis for treatment. Nasal washings and aspirates are unacceptable for Xpert Xpress SARS-CoV-2/FLU/RSV testing.  Fact Sheet for Patients: bloggercourse.com  Fact Sheet for Healthcare Providers: seriousbroker.it  This test is not yet approved or cleared by the [place]  FDA and has been authorized for  detection and/or diagnosis of SARS-CoV-2 by FDA under an Emergency Use Authorization (EUA). This EUA will remain in effect (meaning this test can be used) for the duration of the COVID-19 declaration under Section 564(b)(1) of the Act, 21 U.S.C. section 360bbb-3(b)(1), unless the authorization is terminated or revoked.     Resp Syncytial Virus by PCR 01/13/2024 NEGATIVE  NEGATIVE Final   Comment: (NOTE) Fact Sheet for Patients: bloggercourse.com  Fact Sheet for Healthcare Providers: seriousbroker.it  This test is not yet approved or cleared by the [place]  FDA and has been authorized for detection and/or diagnosis of SARS-CoV-2 by FDA under an Emergency Use Authorization (EUA). This EUA will remain in effect (meaning this test can be used) for the duration of the COVID-19 declaration under Section 564(b)(1) of the Act, 21 U.S.C. section 360bbb-3(b)(1), unless the authorization is terminated or revoked.  Performed at [facility] Lab, [address], [place], [place] [address]    Group A Strep by PCR 01/13/2024 NOT DETECTED  NOT DETECTED Final   Performed at [facility], [address] [place], [place] [address]   Opiates 01/14/2024 NONE DETECTED  NONE DETECTED Final   Cocaine 01/14/2024 POSITIVE (A)  NONE DETECTED Final   Benzodiazepines 01/14/2024 POSITIVE (A)  NONE DETECTED Final   Amphetamines 01/14/2024 POSITIVE (A)  NONE DETECTED Final   Comment: (NOTE) Trazodone  is metabolized in vivo to several metabolites, including pharmacologically active m-CPP, which is excreted in the urine. Immunoassay screens for amphetamines and MDMA have potential cross-reactivity with these compounds and may provide false positive  results.     Tetrahydrocannabinol 01/14/2024 POSITIVE (A)  NONE DETECTED Final   Barbiturates 01/14/2024 NONE DETECTED  NONE DETECTED Final   Comment: (NOTE) DRUG SCREEN FOR MEDICAL PURPOSES ONLY.   IF CONFIRMATION IS NEEDED FOR ANY PURPOSE, NOTIFY LAB WITHIN 5 DAYS.  LOWEST DETECTABLE LIMITS FOR URINE DRUG SCREEN Drug Class                     Cutoff (ng/mL) Amphetamine and metabolites    1000 Barbiturate and metabolites    200 Benzodiazepine                 200 Opiates and metabolites        300 Cocaine and metabolites        300 THC                            50 Performed at [facility], [address] [place], [place] [address]   Admission on 01/07/2024, Discharged on 01/07/2024  Component Date Value  Ref Range Status   Sodium 01/07/2024 133 (L)  135 - 145 mmol/L Final   Potassium 01/07/2024 3.6  3.5 - 5.1 mmol/L Final   Chloride 01/07/2024 98  98 - 111 mmol/L Final   CO2 01/07/2024 24  22 - 32 mmol/L Final   Glucose, Bld 01/07/2024 106 (H)  70 - 99 mg/dL Final   Glucose reference range applies only to samples taken after fasting for at least 8 hours.   BUN 01/07/2024 6  6 - 20 mg/dL Final   Creatinine, Ser 01/07/2024 0.80  0.61 - 1.24 mg/dL Final   Calcium 89/79/7974 9.2  8.9 - 10.3 mg/dL Final   Total Protein 89/79/7974 7.9  6.5 - 8.1 g/dL Final   Albumin 89/79/7974 3.9  3.5 - 5.0 g/dL Final   AST 89/79/7974 106 (H)  15 - 41 U/L Final   ALT 01/07/2024 131 (H)  0 - 44 U/L Final   Alkaline Phosphatase 01/07/2024 78  38 - 126 U/L Final   Total Bilirubin 01/07/2024 0.8  0.0 - 1.2 mg/dL Final   GFR, Estimated 01/07/2024 >60  >60 mL/min Final   Comment: (NOTE) Calculated using the CKD-EPI Creatinine Equation (2021)    Anion gap 01/07/2024 11  5 - 15 Final   Performed at [facility], [address] [place], [place] [address]   Alcohol, Ethyl (B) 01/07/2024 <15  <15 mg/dL Final   Comment: (NOTE) For medical purposes only. Performed at [facility], [address] [place], [place] [address]    Opiates 01/07/2024 NEGATIVE  NEGATIVE Final   Cocaine 01/07/2024 POSITIVE (A)  NEGATIVE Final   Benzodiazepines 01/07/2024  NEGATIVE  NEGATIVE Final   Amphetamines 01/07/2024 POSITIVE (A)  NEGATIVE Final   Tetrahydrocannabinol 01/07/2024 NEGATIVE  NEGATIVE Final   Barbiturates 01/07/2024 NEGATIVE  NEGATIVE Final   Methadone Scn, Ur 01/07/2024 NEGATIVE  NEGATIVE Final   Fentanyl  01/07/2024 NEGATIVE  NEGATIVE Final   Comment: (NOTE) Drug screen is for Medical Purposes only. Positive results are preliminary only. If confirmation is needed, notify lab within 5 days.  Drug Class                 Cutoff (ng/mL) Amphetamine and metabolites 1000 Barbiturate and metabolites 200 Benzodiazepine              200 Opiates and metabolites     300 Cocaine and metabolites     300 THC                         50 Fentanyl                     5 Methadone                   300  Trazodone  is metabolized in vivo to several metabolites,  including pharmacologically active m-CPP, which is excreted in the  urine.  Immunoassay screens for amphetamines and MDMA have potential  cross-reactivity with these compounds and may provide false positive  result.  Performed at [facility], [address], [place], [place] [address]    WBC 01/07/2024 9.5  4.0 - 10.5 K/uL Final   RBC 01/07/2024 4.30  4.22 - 5.81 MIL/uL Final   Hemoglobin 01/07/2024 13.0  13.0 - 17.0 g/dL Final   HCT 89/79/7974 39.7  39.0 - 52.0 % Final   MCV 01/07/2024 92.3  80.0 - 100.0 fL Final   MCH  01/07/2024 30.2  26.0 - 34.0 pg Final   MCHC 01/07/2024 32.7  30.0 - 36.0 g/dL Final   RDW 89/79/7974 13.2  11.5 - 15.5 % Final   Platelets 01/07/2024 385  150 - 400 K/uL Final   nRBC 01/07/2024 0.0  0.0 - 0.2 % Final   Neutrophils Relative % 01/07/2024 70  % Final   Neutro Abs 01/07/2024 6.5  1.7 - 7.7 K/uL Final   Lymphocytes Relative 01/07/2024 17  % Final   Lymphs Abs 01/07/2024 1.6  0.7 - 4.0 K/uL Final   Monocytes Relative 01/07/2024 11  % Final   Monocytes Absolute 01/07/2024 1.0  0.1 - 1.0 K/uL Final   Eosinophils Relative 01/07/2024 2  %  Final   Eosinophils Absolute 01/07/2024 0.2  0.0 - 0.5 K/uL Final   Basophils Relative 01/07/2024 0  % Final   Basophils Absolute 01/07/2024 0.0  0.0 - 0.1 K/uL Final   Immature Granulocytes 01/07/2024 0  % Final   Abs Immature Granulocytes 01/07/2024 0.04  0.00 - 0.07 K/uL Final   Performed at [facility], [address] [place], [place] [address]  Admission on 01/06/2024, Discharged on 01/07/2024  Component Date Value Ref Range Status   Group A Strep by PCR 01/07/2024 NOT DETECTED  NOT DETECTED Final   Performed at [facility], [address] [place], [place] [address]   SARS Coronavirus 2 by RT PCR 01/07/2024 NEGATIVE  NEGATIVE Final   Influenza A by PCR 01/07/2024 NEGATIVE  NEGATIVE Final   Influenza B by PCR 01/07/2024 NEGATIVE  NEGATIVE Final   Comment: (NOTE) The Xpert Xpress SARS-CoV-2/FLU/RSV plus assay is intended as an aid in the diagnosis of influenza from Nasopharyngeal swab specimens and should not be used as a sole basis for treatment. Nasal washings and aspirates are unacceptable for Xpert Xpress SARS-CoV-2/FLU/RSV testing.  Fact Sheet for Patients: bloggercourse.com  Fact Sheet for Healthcare Providers: seriousbroker.it  This test is not yet approved or cleared by the [place]  FDA and has been authorized for detection and/or diagnosis of SARS-CoV-2 by FDA under an Emergency Use Authorization (EUA). This EUA will remain in effect (meaning this test can be used) for the duration of the COVID-19 declaration under Section 564(b)(1) of the Act, 21 U.S.C. section 360bbb-3(b)(1), unless the authorization is terminated or revoked.     Resp Syncytial Virus by PCR 01/07/2024 NEGATIVE  NEGATIVE Final   Comment: (NOTE) Fact Sheet for Patients: bloggercourse.com  Fact Sheet for Healthcare Providers: seriousbroker.it  This test is not yet  approved or cleared by the [place]  FDA and has been authorized for detection and/or diagnosis of SARS-CoV-2 by FDA under an Emergency Use Authorization (EUA). This EUA will remain in effect (meaning this test can be used) for the duration of the COVID-19 declaration under Section 564(b)(1) of the Act, 21 U.S.C. section 360bbb-3(b)(1), unless the authorization is terminated or revoked.  Performed at [facility], [address], [place], [place] [address]   Admission on 01/06/2024, Discharged on 01/06/2024  Component Date Value Ref Range Status   Sodium 01/06/2024 135  135 - 145 mmol/L Final   Potassium 01/06/2024 3.3 (L)  3.5 - 5.1 mmol/L Final   Chloride 01/06/2024 99  98 - 111 mmol/L Final   CO2 01/06/2024 22  22 - 32 mmol/L Final   Glucose, Bld 01/06/2024 104 (H)  70 - 99 mg/dL Final   Glucose reference range applies only to samples taken after fasting for  at least 8 hours.   BUN 01/06/2024 10  6 - 20 mg/dL Final   Creatinine, Ser 01/06/2024 0.85  0.61 - 1.24 mg/dL Final   Calcium 89/80/7974 9.3  8.9 - 10.3 mg/dL Final   Total Protein 89/80/7974 8.5 (H)  6.5 - 8.1 g/dL Final   Albumin 89/80/7974 4.2  3.5 - 5.0 g/dL Final   AST 89/80/7974 107 (H)  15 - 41 U/L Final   ALT 01/06/2024 132 (H)  0 - 44 U/L Final   Alkaline Phosphatase 01/06/2024 77  38 - 126 U/L Final   Total Bilirubin 01/06/2024 0.7  0.0 - 1.2 mg/dL Final   GFR, Estimated 01/06/2024 >60  >60 mL/min Final   Comment: (NOTE) Calculated using the CKD-EPI Creatinine Equation (2021)    Anion gap 01/06/2024 14  5 - 15 Final   Performed at [facility], [address] [place], [place] [address]   Alcohol, Ethyl (B) 01/06/2024 <15  <15 mg/dL Final   Comment: (NOTE) For medical purposes only. Performed at [facility], [address], [place], [place] [address]    WBC 01/06/2024 7.8  4.0 - 10.5 K/uL Final   RBC 01/06/2024 4.25  4.22 - 5.81 MIL/uL Final   Hemoglobin  01/06/2024 13.0  13.0 - 17.0 g/dL Final   HCT 89/80/7974 38.8 (L)  39.0 - 52.0 % Final   MCV 01/06/2024 91.3  80.0 - 100.0 fL Final   MCH 01/06/2024 30.6  26.0 - 34.0 pg Final   MCHC 01/06/2024 33.5  30.0 - 36.0 g/dL Final   RDW 89/80/7974 13.2  11.5 - 15.5 % Final   Platelets 01/06/2024 404 (H)  150 - 400 K/uL Final   nRBC 01/06/2024 0.0  0.0 - 0.2 % Final   Performed at [facility], [address] [place], [place] [address]   Opiates 01/06/2024 NEGATIVE  NEGATIVE Final   Cocaine 01/06/2024 POSITIVE (A)  NEGATIVE Final   Benzodiazepines 01/06/2024 POSITIVE (A)  NEGATIVE Final   Amphetamines 01/06/2024 POSITIVE (A)  NEGATIVE Final   Tetrahydrocannabinol 01/06/2024 POSITIVE (A)  NEGATIVE Final   Barbiturates 01/06/2024 NEGATIVE  NEGATIVE Final   Methadone Scn, Ur 01/06/2024 NEGATIVE  NEGATIVE Final   Fentanyl  01/06/2024 NEGATIVE  NEGATIVE Final   Comment: (NOTE) Drug screen is for Medical Purposes only. Positive results are preliminary only. If confirmation is needed, notify lab within 5 days.  Drug Class                 Cutoff (ng/mL) Amphetamine and metabolites 1000 Barbiturate and metabolites 200 Benzodiazepine              200 Opiates and metabolites     300 Cocaine and metabolites     300 THC                         50 Fentanyl                     5 Methadone                   300  Trazodone  is metabolized in vivo to several metabolites,  including pharmacologically active m-CPP, which is excreted in the  urine.  Immunoassay screens for amphetamines and MDMA have potential  cross-reactivity with these compounds and may provide false positive  result.  Performed at [facility], [address], [place], [place] [address]  Acetaminophen  (Tylenol ), Serum 01/06/2024 <10 (L)  10 - 30 ug/mL Final   Comment: (NOTE) Toxic concentrations can be more effectively related to post dose interval; > 200, > 100, and > 50 ug/mL serum  concentrations correspond to toxic concentrations at 4, 8, and 12 hours post dose, respectively.  Performed at [facility], [address] [place], [place] [address]    Salicylate Lvl 01/06/2024 <7.0 (L)  7.0 - 30.0 mg/dL Final   Performed at [facility], [address], [place], [place] [address]   Color, Urine 01/06/2024 AMBER (A)  YELLOW Final   BIOCHEMICALS MAY BE AFFECTED BY COLOR   APPearance 01/06/2024 HAZY (A)  CLEAR Final   Specific Gravity, Urine 01/06/2024 1.026  1.005 - 1.030 Final   pH 01/06/2024 5.0  5.0 - 8.0 Final   Glucose, UA 01/06/2024 NEGATIVE  NEGATIVE mg/dL Final   Hgb urine dipstick 01/06/2024 SMALL (A)  NEGATIVE Final   Bilirubin Urine 01/06/2024 NEGATIVE  NEGATIVE Final   Ketones, ur 01/06/2024 5 (A)  NEGATIVE mg/dL Final   Protein, ur 89/80/7974 100 (A)  NEGATIVE mg/dL Final   Nitrite 89/80/7974 NEGATIVE  NEGATIVE Final   Leukocytes,Ua 01/06/2024 NEGATIVE  NEGATIVE Final   RBC / HPF 01/06/2024 11-20  0 - 5 RBC/hpf Final   WBC, UA 01/06/2024 11-20  0 - 5 WBC/hpf Final   Bacteria, UA 01/06/2024 RARE (A)  NONE SEEN Final   Squamous Epithelial / HPF 01/06/2024 0-5  0 - 5 /HPF Final   Mucus 01/06/2024 PRESENT   Final   Hyaline Casts, UA 01/06/2024 PRESENT   Final   Performed at [facility], [address] [place], [place] [address]  Admission on 01/04/2024, Discharged on 01/05/2024  Component Date Value Ref Range Status   RPR Ser Ql 01/04/2024 NON REACTIVE  NON REACTIVE Final   Performed at [facility], [address] [place], [place] [address]   HIV Screen 4th Generation wRfx 01/04/2024 Non Reactive  Non Reactive Final   Performed at [facility], [address], [place], [place] [address]   Neisseria Gonorrhea 01/05/2024 Negative   Final   Chlamydia 01/05/2024 Negative   Final   Comment 01/05/2024 Normal Reference Ranger Chlamydia - Negative   Final   Comment 01/05/2024 Normal  Reference Range Neisseria Gonorrhea - Negative   Final  Admission on 01/04/2024, Discharged on 01/04/2024  Component Date Value Ref Range Status   Sodium 01/04/2024 133 (L)  135 - 145 mmol/L Final   Potassium 01/04/2024 3.2 (L)  3.5 - 5.1 mmol/L Final   Chloride 01/04/2024 99  98 - 111 mmol/L Final   CO2 01/04/2024 21 (L)  22 - 32 mmol/L Final   Glucose, Bld 01/04/2024 117 (H)  70 - 99 mg/dL Final   Glucose reference range applies only to samples taken after fasting for at least 8 hours.   BUN 01/04/2024 5 (L)  6 - 20 mg/dL Final   Creatinine, Ser 01/04/2024 0.94  0.61 - 1.24 mg/dL Final   Calcium 89/82/7974 9.2  8.9 - 10.3 mg/dL Final   Total Protein 89/82/7974 9.1 (H)  6.5 - 8.1 g/dL Final   Albumin 89/82/7974 3.9  3.5 - 5.0 g/dL Final   AST 89/82/7974 85 (H)  15 - 41 U/L Final   ALT 01/04/2024 103 (H)  0 - 44 U/L Final   Alkaline Phosphatase 01/04/2024 77  38 - 126 U/L Final   Total Bilirubin 01/04/2024  1.5 (H)  0.0 - 1.2 mg/dL Final   GFR, Estimated 01/04/2024 >60  >60 mL/min Final   Comment: (NOTE) Calculated using the CKD-EPI Creatinine Equation (2021)    Anion gap 01/04/2024 13  5 - 15 Final   Performed at [facility], [address], [place], [place] [address]   Alcohol, Ethyl (B) 01/04/2024 <15  <15 mg/dL Final   Comment: (NOTE) For medical purposes only. Performed at [facility], [address], [place], [place] [address]    Opiates 01/04/2024 NONE DETECTED  NONE DETECTED Final   Cocaine 01/04/2024 POSITIVE (A)  NONE DETECTED Final   Benzodiazepines 01/04/2024 NONE DETECTED  NONE DETECTED Final   Amphetamines 01/04/2024 POSITIVE (A)  NONE DETECTED Final   Comment: (NOTE) Trazodone  is metabolized in vivo to several metabolites, including pharmacologically active m-CPP, which is excreted in the urine. Immunoassay screens for amphetamines and MDMA have potential cross-reactivity with these compounds and may provide false positive  results.      Tetrahydrocannabinol 01/04/2024 NONE DETECTED  NONE DETECTED Final   Barbiturates 01/04/2024 NONE DETECTED  NONE DETECTED Final   Comment: (NOTE) DRUG SCREEN FOR MEDICAL PURPOSES ONLY.  IF CONFIRMATION IS NEEDED FOR ANY PURPOSE, NOTIFY LAB WITHIN 5 DAYS.  LOWEST DETECTABLE LIMITS FOR URINE DRUG SCREEN Drug Class                     Cutoff (ng/mL) Amphetamine and metabolites    1000 Barbiturate and metabolites    200 Benzodiazepine                 200 Opiates and metabolites        300 Cocaine and metabolites        300 THC                            50 Performed at [facility], [address], [place], [place] [address]    WBC 01/04/2024 9.8  4.0 - 10.5 K/uL Final   RBC 01/04/2024 4.64  4.22 - 5.81 MIL/uL Final   Hemoglobin 01/04/2024 14.4  13.0 - 17.0 g/dL Final   HCT 89/82/7974 41.9  39.0 - 52.0 % Final   MCV 01/04/2024 90.3  80.0 - 100.0 fL Final   MCH 01/04/2024 31.0  26.0 - 34.0 pg Final   MCHC 01/04/2024 34.4  30.0 - 36.0 g/dL Final   RDW 89/82/7974 12.8  11.5 - 15.5 % Final   Platelets 01/04/2024 422 (H)  150 - 400 K/uL Final   nRBC 01/04/2024 0.0  0.0 - 0.2 % Final   Neutrophils Relative % 01/04/2024 55  % Final   Neutro Abs 01/04/2024 5.5  1.7 - 7.7 K/uL Final   Lymphocytes Relative 01/04/2024 25  % Final   Lymphs Abs 01/04/2024 2.5  0.7 - 4.0 K/uL Final   Monocytes Relative 01/04/2024 16  % Final   Monocytes Absolute 01/04/2024 1.6 (H)  0.1 - 1.0 K/uL Final   Eosinophils Relative 01/04/2024 3  % Final   Eosinophils Absolute 01/04/2024 0.3  0.0 - 0.5 K/uL Final   Basophils Relative 01/04/2024 1  % Final   Basophils Absolute 01/04/2024 0.1  0.0 - 0.1 K/uL Final   Immature Granulocytes 01/04/2024 0  % Final   Abs Immature Granulocytes 01/04/2024 0.03  0.00 - 0.07 K/uL Final   Performed at [facility], [address] [place], [place] [address]  Admission on 09/07/2023, Discharged on 09/11/2023  Component Date Value Ref Range Status   Sodium 09/09/2023  136  135 - 145 mmol/L Final   Potassium 09/09/2023 4.5  3.5 - 5.1 mmol/L Final   Chloride 09/09/2023 99  98 - 111 mmol/L Final   CO2 09/09/2023 28  22 - 32 mmol/L Final   Glucose, Bld 09/09/2023 84  70 - 99 mg/dL Final   Glucose reference range applies only to samples taken after fasting for at least 8 hours.   BUN 09/09/2023 11  6 - 20 mg/dL Final   Creatinine, Ser 09/09/2023 1.16  0.61 - 1.24 mg/dL Final   Calcium 93/77/7974 9.8  8.9 - 10.3 mg/dL Final   Total Protein 93/77/7974 7.9  6.5 - 8.1 g/dL Final   Albumin 93/77/7974 3.0 (L)  3.5 - 5.0 g/dL Final   AST 93/77/7974 60 (H)  15 - 41 U/L Final   ALT 09/09/2023 59 (H)  0 - 44 U/L Final   Alkaline Phosphatase 09/09/2023 56  38 - 126 U/L Final   Total Bilirubin 09/09/2023 0.6  0.0 - 1.2 mg/dL Final   GFR, Estimated 09/09/2023 >60  >60 mL/min Final   Comment: (NOTE) Calculated using the CKD-EPI Creatinine Equation (2021)    Anion gap 09/09/2023 9  5 - 15 Final   Performed at [facility], [address] [place], [place] [address]   Vitamin B-12 09/09/2023 206  180 - 914 pg/mL Final   Comment: (NOTE) This assay is not validated for testing neonatal or myeloproliferative syndrome specimens for Vitamin B12 levels. Performed at [facility], [address] [place], [place] [address]    Vitamin B6 09/09/2023 7.0  3.4 - 65.2 ug/L Final   Comment: (NOTE) This test was developed and its performance characteristics determined by [facility]. It has not been cleared or approved by the Food and Drug Administration.                             Deficiency:         <3.4                             Marginal:      3.4 - 5.1                             Adequate:           >5.1 Performed At: [facility] [address] [place], [place] [id number] Jennette Shorter MD Ey:[id number]   There may be more visits with results that are not included.    Blood Alcohol level:  Lab Results  Component Value Date   [facility] <15 02/03/2024    ETH <15 01/13/2024    Metabolic Disorder Labs: Lab Results  Component Value Date   HGBA1C 5.1 02/05/2024   MPG 99.67 02/05/2024   MPG 91.06 07/19/2023   No results found for: PROLACTIN Lab Results  Component Value Date   CHOL 154 02/05/2024   TRIG 220 (H) 02/05/2024   HDL 30 (L) 02/05/2024   CHOLHDL 5.1 02/05/2024   VLDL 44 (H) 02/05/2024   LDLCALC 80 02/05/2024   LDLCALC 102 (H) 07/19/2023    Therapeutic Lab Levels: No results found for: LITHIUM No results found for: VALPROATE No results found for: CBMZ  Physical Findings   AIMS  Flowsheet Row Clinical Support from 05/19/2022 in [facility]Dis[facility]) [facility]) [facility]  AIMS Total Score 10 0 0   AUDIT    Flowsheet Row ED from 02/15/2024 in [facility] Admission (Discharged) from [facility] ED from 09/07/2023 in [facility] Admission (Discharged) from [facility] Ad[facility] (Discharged) from [facility]  Alcohol Use Disorder Identification Test Final Score (AUDIT) 32 35 24 31 0   GAD-7    Flowsheet Row Video Visit from 01/26/2022 in [facility] Video Visit from 11/01/2021 in [facility] Video Visit from 03/22/2021 in [facility] Office Visit from 12/21/2020 in [facility]  Total GAD-7 Score 17 18 20 19    PHQ2-9    Flowsheet Row ED from 09/07/2023 in [facility] ED from 09/06/2023 in [facility] ED from 09/05/2023 in [facility] Office Visit from  11/29/2022 in [facility] ED from 07/09/2022 in [facility]  PHQ-2 Total Score 0 2 2 3  0  PHQ-9 Total Score 0 7 7 12 22    Flowsheet Row ED from 02/15/2024 in [facility] ED from 02/14/2024 in [facility] Emergency Department at [facility] ED from 02/11/2024 in [facility] Emergency Department at [facility]  C-SSRS RISK CATEGORY Moderate Risk Moderate Risk No Risk     Musculoskeletal  Strength & Muscle Tone: within normal limits Gait & Station: normal Patient leans: N/A  Psychiatric Specialty Exam  Presentation  General Appearance:  Fairly Groomed  Eye Contact: Fair  Speech: Clear and Coherent  Speech Volume: Decreased  Handedness: Right   Mood and Affect  Mood: Hopeless  Affect: Congruent   Thought Process  Thought Processes: Coherent  Descriptions of Associations:Intact  Orientation:Full (Time, Place and Person)  Thought Content:Logical  Diagnosis of Schizophrenia or Schizoaffective disorder in past: Yes  Duration of Psychotic Symptoms: Greater than six months   Hallucinations:Hallucinations: Auditory Description of Auditory Hallucinations: whispers  Ideas of Reference:None  Suicidal Thoughts:Suicidal Thoughts: No  Homicidal Thoughts:Homicidal Thoughts: No   Sensorium  Memory: Immediate Good; Recent Fair; Remote Fair  Judgment: Poor  Insight: Fair   Chartered Certified Accountant: Fair  Attention Span: Fair  Recall: Good  Fund of Knowledge: Good  Language: Good   Psychomotor Activity  Psychomotor Activity:Psychomotor Activity: Tremor   Assets  Assets: Communication Skills; Desire for Improvement; Resilience   Sleep  Sleep:Sleep: Good  Estimated Sleeping Duration (Last 24 Hours): 14.50-16.50 hours  No data recorded  Physical Exam  Physical Exam Vitals and nursing note  reviewed.  HENT:     Head: Normocephalic.     Mouth/Throat:     Mouth: Mucous membranes are moist.  Cardiovascular:     Rate and Rhythm: Normal rate.  Pulmonary:     Effort: Pulmonary effort is normal.  Musculoskeletal:        General: Normal range of motion.  Skin:    General: Skin is warm and dry.  Neurological:     Mental Status: He is alert and oriented to person, place, and time.  Psychiatric:  Comments: See HPI    Review of Systems  Constitutional:  Negative for chills and fever.  HENT:  Negative for congestion and sore throat.   Respiratory:  Negative for cough and shortness of breath.   Cardiovascular:  Negative for chest pain and palpitations.  Gastrointestinal:  Negative for diarrhea, nausea and vomiting.  Psychiatric/Behavioral:  Positive for hallucinations and substance abuse. Negative for suicidal ideas. The patient is nervous/anxious.    Blood pressure 119/70, pulse 86, temperature 98.7 F (37.1 C), temperature source Oral, resp. rate 15, SpO2 99%. There is no height or weight on file to calculate BMI.  Treatment Plan Summary: Daily contact with patient to assess and evaluate symptoms and progress in treatment  Medication management Continue CIWA protocol with Ativan  Tylenol  650 mg p.o. every 6 hours as needed mild pain Activity as tolerated Safety checks every 15 minutes Mylanta 30 mL p.o. every 4 hours as needed indigestion Increase vitamin D3 to 2000 units p.o. daily -vitamin D  level 28.74 on 02/05/24 Continue agitation protocol Continue Milk of Magnesia 30 mL p.o. daily as needed mild constipation Continue multivitamin 1 tablet p.o. daily for supplementation Continue nicotine  patch 21 mg transdermal daily for smoking cessation Continue thiamine  100 mg p.o. daily for supplementation  Plan Refer to [facility] care management to facilitate admission to [facility] or other available residential treatment program.  Anticipated discharge on 02/20/2024.  Sherrell Culver,  PMHNP-BC, FNP-BC  02/16/2024 1:57 PM

## 2024-02-16 NOTE — ED Notes (Signed)
 Patient alert & oriented x4. Denies intent to harm self or others when asked. Denies VH, endorses AH of unintelligible whispers. Patient denies any physical complaints when asked. Scheduled medications administered with no complications, patient requested to wait on the NRT, medication held at this time. No acute distress noted. Support and encouragement provided. Patient observed in milieu. No inappropriate behaviors observed or reported. Routine safety checks conducted per facility protocol. Encouraged patient to notify staff if any thoughts of harm towards self or others arise. Patient verbalizes understanding and agreement.

## 2024-02-16 NOTE — Progress Notes (Signed)
 Patient is resting at this time with no acute distress noted.  Respirations present, even and unlabored.  No issues noted.  Will continue Q 15 min safety checks for safety/behavior.

## 2024-02-17 DIAGNOSIS — R45851 Suicidal ideations: Secondary | ICD-10-CM | POA: Diagnosis not present

## 2024-02-17 DIAGNOSIS — G4700 Insomnia, unspecified: Secondary | ICD-10-CM | POA: Diagnosis not present

## 2024-02-17 DIAGNOSIS — F1220 Cannabis dependence, uncomplicated: Secondary | ICD-10-CM | POA: Diagnosis not present

## 2024-02-17 DIAGNOSIS — Z560 Unemployment, unspecified: Secondary | ICD-10-CM | POA: Diagnosis not present

## 2024-02-17 MED ORDER — HALOPERIDOL 5 MG PO TABS
7.5000 mg | ORAL_TABLET | Freq: Every day | ORAL | Status: DC
  Administered 2024-02-18 (×2): 7.5 mg via ORAL
  Filled 2024-02-17 (×2): qty 2

## 2024-02-17 MED ORDER — HALOPERIDOL 5 MG PO TABS
5.0000 mg | ORAL_TABLET | Freq: Every day | ORAL | Status: DC
  Administered 2024-02-18 – 2024-02-19 (×2): 5 mg via ORAL
  Filled 2024-02-17 (×2): qty 1

## 2024-02-17 NOTE — Group Note (Signed)
 Group Topic: Wellness  Group Date: 02/17/2024 Start Time: 1300 End Time: 1330 Facilitators: Lawrnce Garvin PARAS, MD; Veverly Oddis BRAVO, [place]  Department: [facility]  Number of Participants: 6  Group Focus: Stress on the Brain Treatment Modality:  Cognitive Behavioral Therapy Interventions utilized were clarifictaion Purpose: increase insight  Name: Jerry [name] Date of Birth: 1975-09-13  MR: [id number]    Level of Participation: moderate Quality of Participation: engaged Interactions with others: appropriate  Mood/Affect: appropriate Triggers (if applicable): none Cognition: coherent/clear Progress: Moderate Response: Paitent did not share but respectful while others did. Plan: patient will be encouraged to share when felt comfortable and encouraged to keep attending group.  Patients Problems:  Patient Active Problem List   Diagnosis Date Noted   Alcohol use disorder, severe, dependence (HCC) 02/15/2024   Cocaine use disorder (HCC) 02/15/2024   Mild benzodiazepine use disorder (HCC) 02/06/2024   Protein-calorie malnutrition, severe 07/03/2023   Left arm cellulitis 07/01/2023   Jaw pain 11/29/2022   Need for influenza vaccination 11/29/2022   Erectile dysfunction 11/29/2022   Hyponatremia 11/29/2022   History of hepatitis C virus infection 11/29/2022   Tardive dyskinesia 10/24/2022   Generalized anxiety disorder 10/24/2022   PTSD (post-traumatic stress disorder) 10/24/2022   Healthcare maintenance 10/24/2022   Schizophrenia (HCC) 07/07/2022   Malingering 09/07/2020   Homelessness 09/07/2020   Schizophrenia spectrum disorder with psychotic disorder type not yet determined (HCC) 07/17/2020   Methamphetamine abuse (HCC) 07/17/2020   Marijuana abuse 07/17/2020   Cocaine abuse (HCC) 07/17/2020   Anxiety and depression 06/25/2014   Family history of diabetes mellitus (DM) 06/25/2014   Family history of thyroid  disease 06/25/2014   Tobacco use  disorder 06/25/2014   Alcohol abuse 05/18/2013   Polysubstance abuse (HCC) 05/18/2013   Depression, unspecified 05/18/2013

## 2024-02-17 NOTE — ED Notes (Signed)
 Paitent provided dinner.

## 2024-02-17 NOTE — ED Notes (Signed)
 Patient in bed sleeping, no distress noted, respirations even and unlabored, continue to monitor for safety

## 2024-02-17 NOTE — ED Notes (Signed)
 Paitent provided breakfast.

## 2024-02-17 NOTE — ED Notes (Signed)
 Paitent provided lunch.

## 2024-02-17 NOTE — ED Notes (Signed)
 Patient is asleep at this time, no CIWA assessment completed.

## 2024-02-17 NOTE — ED Notes (Signed)
 Pt received bedresting alert to name .  Denies SI or experiencing AVH.  Will provide safety

## 2024-02-17 NOTE — Group Note (Signed)
 Group Topic: Healthy Self Image and Positive Change  Group Date: 02/17/2024 Start Time: 2030 End Time: 2100 Facilitators: Anice Benton LABOR, NT  Department: [facility]  Number of Participants: 4  Group Focus: communication, goals/reality orientation, and self-awareness Treatment Modality:  Cognitive Behavioral Therapy Interventions utilized were group exercise Purpose: express feelings and increase insight  Name: Jerry [name] Date of Birth: Feb 15, 1976  MR: [id number]    Level of Participation: Did Not Attend  Quality of Participation: N/A Interactions with others: N/A Mood/Affect: N/A Triggers (if applicable): N/A Cognition: N/A Progress: None Response: N/A Plan: patient will be encouraged to attend groups   Patients Problems:  Patient Active Problem List   Diagnosis Date Noted   Alcohol use disorder, severe, dependence (HCC) 02/15/2024   Cocaine use disorder (HCC) 02/15/2024   Mild benzodiazepine use disorder (HCC) 02/06/2024   Protein-calorie malnutrition, severe 07/03/2023   Left arm cellulitis 07/01/2023   Jaw pain 11/29/2022   Need for influenza vaccination 11/29/2022   Erectile dysfunction 11/29/2022   Hyponatremia 11/29/2022   History of hepatitis C virus infection 11/29/2022   Tardive dyskinesia 10/24/2022   Generalized anxiety disorder 10/24/2022   PTSD (post-traumatic stress disorder) 10/24/2022   Healthcare maintenance 10/24/2022   Schizophrenia (HCC) 07/07/2022   Malingering 09/07/2020   Homelessness 09/07/2020   Schizophrenia spectrum disorder with psychotic disorder type not yet determined (HCC) 07/17/2020   Methamphetamine abuse (HCC) 07/17/2020   Marijuana abuse 07/17/2020   Cocaine abuse (HCC) 07/17/2020   Anxiety and depression 06/25/2014   Family history of diabetes mellitus (DM) 06/25/2014   Family history of thyroid  disease 06/25/2014   Tobacco use disorder 06/25/2014   Alcohol abuse 05/18/2013   Polysubstance  abuse (HCC) 05/18/2013   Depression, unspecified 05/18/2013

## 2024-02-17 NOTE — ED Provider Notes (Signed)
 Behavioral Health Progress Note  Date and Time: 02/17/2024 10:21 AM Name: Jerry [name] MRN:  [id number]  Subjective:   I am hearing whispers.   Evaluation:  Lynwood Sor seen and evaluated face-to-face by this provider.  Reports auditory hallucinations related to ongoing whispering.  Denies that voices are command in nature.  He has a documented history related to bipolar 1 disorder, schizophrenia, generalized anxiety disorder and major depressive disorder.  Currently he is prescribed Haldol  5 mg p.o. twice daily.    Discussed increasing Haldol  5 mg twice daily to 5 mg daily and increase Haldol  5 mg to 7.5 mg nightly.  He reports he is eager to residential treatment.  States he has been residing in [employer].   If you call that homeless.  Reports he has been living like this for many years.  Reports feeling suicidal prior to admission currently denying.Reports utilizing cocaine, heroin and alcohol prior to this admission.  UDS positive for benzodiazepines and marijuana.  Denied alcohol withdrawal symptoms and/or cravings.  Reported fair sleep and okay appetite.  Staff to continue to monitor for safety.  Support, encouragement  and reassurance was provided.  Diagnosis:  Final diagnoses:  Polysubstance abuse (HCC)    Total Time spent with patient: 15 minutes  Cpoied form chart:  Past Psychiatric History:  Schizophrenia,  has a past medical history of Anxiety, Bipolar 1 disorder (HCC), Depression,  EtOH dependence (HCC), Hallucination, Paranoid schizophrenia (HCC), and PTSD (post-traumatic stress disorder).  Past Medical History: Drug-induced seizure Family History: family history includes Cancer in his mother; Diabetes in his father and maternal grandmother; Heart disease in his father and mother; Lung cancer in his mother; Stroke in his paternal grandfather.  Family Psychiatric  History: none reported Social History: Unemployed. Housing unstable.   Additional Social History:                          Sleep: Fair  Appetite:  Fair  Current Medications:  Current Facility-Administered Medications  Medication Dose Route Frequency Provider Last Rate Last Admin   acetaminophen  (TYLENOL ) tablet 650 mg  650 mg Oral Q6H PRN Mannie Jerel PARAS, NP   650 mg at 02/16/24 1229   alum & mag hydroxide-simeth (MAALOX/MYLANTA) 200-200-20 MG/5ML suspension 30 mL  30 mL Oral Q4H PRN Mannie Jerel PARAS, NP       cholecalciferol  (VITAMIN D3) 25 MCG (1000 UNIT) tablet 2,000 Units  2,000 Units Oral Daily Hobson, Fran E, NP   2,000 Units at 02/17/24 0900   haloperidol  (HALDOL ) tablet 5 mg  5 mg Oral TID PRN Mannie Jerel PARAS, NP       And   diphenhydrAMINE  (BENADRYL ) capsule 50 mg  50 mg Oral TID PRN Mannie Jerel PARAS, NP       haloperidol  lactate (HALDOL ) injection 5 mg  5 mg Intramuscular TID PRN Mannie Jerel PARAS, NP       And   diphenhydrAMINE  (BENADRYL ) injection 50 mg  50 mg Intramuscular TID PRN Mannie Jerel PARAS, NP       And   LORazepam  (ATIVAN ) injection 2 mg  2 mg Intramuscular TID PRN Mannie Jerel PARAS, NP       haloperidol  lactate (HALDOL ) injection 10 mg  10 mg Intramuscular TID PRN Mannie Jerel PARAS, NP       And   diphenhydrAMINE  (BENADRYL ) injection 50 mg  50 mg Intramuscular TID PRN Mannie Jerel PARAS, NP       And  LORazepam  (ATIVAN ) injection 2 mg  2 mg Intramuscular TID PRN Mannie Jerel PARAS, NP       NOREEN ON 02/18/2024] haloperidol  (HALDOL ) tablet 5 mg  5 mg Oral Daily Ezzard Staci SAILOR, NP       And   haloperidol  (HALDOL ) tablet 7.5 mg  7.5 mg Oral QHS Ezzard Staci SAILOR, NP       hydrOXYzine  (ATARAX ) tablet 25 mg  25 mg Oral Q6H PRN Mannie Jerel PARAS, NP   25 mg at 02/16/24 1229   loperamide  (IMODIUM ) capsule 2-4 mg  2-4 mg Oral PRN Mannie Jerel PARAS, NP       LORazepam  (ATIVAN ) tablet 1 mg  1 mg Oral Q6H PRN Mannie Jerel PARAS, NP       magnesium  hydroxide (MILK OF MAGNESIA) suspension 30 mL  30 mL Oral Daily PRN Mannie Jerel PARAS, NP       multivitamin with minerals tablet 1  tablet  1 tablet Oral Daily Mannie Jerel PARAS, NP   1 tablet at 02/17/24 9097   nicotine  (NICODERM CQ  - dosed in mg/24 hours) patch 21 mg  21 mg Transdermal Q0600 Mannie Jerel PARAS, NP       ondansetron  (ZOFRAN -ODT) disintegrating tablet 4 mg  4 mg Oral Q6H PRN Mannie Jerel PARAS, NP   4 mg at 02/16/24 1230   thiamine  (VITAMIN B1) tablet 100 mg  100 mg Oral Daily Mannie Jerel PARAS, NP   100 mg at 02/17/24 0902   Current Outpatient Medications  Medication Sig Dispense Refill   cholecalciferol  (CHOLECALCIFEROL ) 25 MCG tablet Take 1 tablet (1,000 Units total) by mouth daily. (Patient not taking: Reported on 02/15/2024) 30 tablet 0   haloperidol  (HALDOL ) 5 MG tablet Take 1 tablet (5 mg total) by mouth 2 (two) times daily. (Patient not taking: Reported on 02/15/2024) 60 tablet 0   Multiple Vitamin (MULTIVITAMIN WITH MINERALS) TABS tablet Take 1 tablet by mouth daily. (Patient not taking: Reported on 02/15/2024) 30 tablet 0   naltrexone  (DEPADE) 50 MG tablet Take 1 tablet (50 mg total) by mouth daily. (Patient not taking: Reported on 02/15/2024) 30 tablet 0   nicotine  (NICODERM CQ  - DOSED IN MG/24 HOURS) 14 mg/24hr patch Place 1 patch (14 mg total) onto the skin daily. (Patient not taking: Reported on 02/15/2024) 30 patch 0    Labs  Lab Results:  Admission on 02/14/2024, Discharged on 02/15/2024  Component Date Value Ref Range Status   WBC 02/14/2024 8.0  4.0 - 10.5 K/uL Final   RBC 02/14/2024 4.57  4.22 - 5.81 MIL/uL Final   Hemoglobin 02/14/2024 14.3  13.0 - 17.0 g/dL Final   HCT 88/72/7974 42.3  39.0 - 52.0 % Final   MCV 02/14/2024 92.6  80.0 - 100.0 fL Final   MCH 02/14/2024 31.3  26.0 - 34.0 pg Final   MCHC 02/14/2024 33.8  30.0 - 36.0 g/dL Final   RDW 88/72/7974 13.7  11.5 - 15.5 % Final   Platelets 02/14/2024 450 (H)  150 - 400 K/uL Final   nRBC 02/14/2024 0.0  0.0 - 0.2 % Final   Neutrophils Relative % 02/14/2024 47  % Final   Neutro Abs 02/14/2024 3.7  1.7 - 7.7 K/uL Final   Lymphocytes  Relative 02/14/2024 35  % Final   Lymphs Abs 02/14/2024 2.8  0.7 - 4.0 K/uL Final   Monocytes Relative 02/14/2024 13  % Final   Monocytes Absolute 02/14/2024 1.0  0.1 - 1.0 K/uL Final   Eosinophils Relative 02/14/2024  4  % Final   Eosinophils Absolute 02/14/2024 0.3  0.0 - 0.5 K/uL Final   Basophils Relative 02/14/2024 1  % Final   Basophils Absolute 02/14/2024 0.1  0.0 - 0.1 K/uL Final   Immature Granulocytes 02/14/2024 0  % Final   Abs Immature Granulocytes 02/14/2024 0.02  0.00 - 0.07 K/uL Final   Performed at [facility], [address], [place], [place] [address]   Sodium 02/14/2024 132 (L)  135 - 145 mmol/L Final   Potassium 02/14/2024 3.7  3.5 - 5.1 mmol/L Final   Chloride 02/14/2024 97 (L)  98 - 111 mmol/L Final   CO2 02/14/2024 24  22 - 32 mmol/L Final   Glucose, Bld 02/14/2024 102 (H)  70 - 99 mg/dL Final   Glucose reference range applies only to samples taken after fasting for at least 8 hours.   BUN 02/14/2024 6  6 - 20 mg/dL Final   Creatinine, Ser 02/14/2024 0.86  0.61 - 1.24 mg/dL Final   Calcium 88/72/7974 9.4  8.9 - 10.3 mg/dL Final   Total Protein 88/72/7974 8.8 (H)  6.5 - 8.1 g/dL Final   Albumin 88/72/7974 3.5  3.5 - 5.0 g/dL Final   AST 88/72/7974 66 (H)  15 - 41 U/L Final   ALT 02/14/2024 83 (H)  0 - 44 U/L Final   Alkaline Phosphatase 02/14/2024 67  38 - 126 U/L Final   Total Bilirubin 02/14/2024 1.1  0.0 - 1.2 mg/dL Final   GFR, Estimated 02/14/2024 >60  >60 mL/min Final   Comment: (NOTE) Calculated using the CKD-EPI Creatinine Equation (2021)    Anion gap 02/14/2024 11  5 - 15 Final   Performed at [facility], [address], [place], [place] [address]   Lipase 02/14/2024 27  11 - 51 U/L Final   Performed at [facility], [address] [place], [place] [address]   Magnesium  02/14/2024 1.9  1.7 - 2.4 mg/dL Final   Performed at [facility], [address] [place], [place] [address]   SARS Coronavirus 2 by RT PCR 02/15/2024  NEGATIVE  NEGATIVE Final   Influenza A by PCR 02/15/2024 NEGATIVE  NEGATIVE Final   Influenza B by PCR 02/15/2024 NEGATIVE  NEGATIVE Final   Comment: (NOTE) The Xpert Xpress SARS-CoV-2/FLU/RSV plus assay is intended as an aid in the diagnosis of influenza from Nasopharyngeal swab specimens and should not be used as a sole basis for treatment. Nasal washings and aspirates are unacceptable for Xpert Xpress SARS-CoV-2/FLU/RSV testing.  Fact Sheet for Patients: bloggercourse.com  Fact Sheet for Healthcare Providers: seriousbroker.it  This test is not yet approved or cleared by the [place]  FDA and has been authorized for detection and/or diagnosis of SARS-CoV-2 by FDA under an Emergency Use Authorization (EUA). This EUA will remain in effect (meaning this test can be used) for the duration of the COVID-19 declaration under Section 564(b)(1) of the Act, 21 U.S.C. section 360bbb-3(b)(1), unless the authorization is terminated or revoked.     Resp Syncytial Virus by PCR 02/15/2024 NEGATIVE  NEGATIVE Final   Comment: (NOTE) Fact Sheet for Patients: bloggercourse.com  Fact Sheet for Healthcare Providers: seriousbroker.it  This test is not yet approved or cleared by the [place]  FDA and has been authorized for detection and/or diagnosis of SARS-CoV-2 by FDA under an Emergency Use Authorization (EUA). This EUA will remain in effect (meaning this test can be used) for the duration of the COVID-19 declaration  under Section 564(b)(1) of the Act, 21 U.S.C. section 360bbb-3(b)(1), unless the authorization is terminated or revoked.  Performed at [facility], [address] [place], [place] [address]    Opiates 02/15/2024 NONE DETECTED  NONE DETECTED Final   Cocaine 02/15/2024 POSITIVE (A)  NONE DETECTED Final   Benzodiazepines 02/15/2024 POSITIVE (A)  NONE DETECTED Final    Amphetamines 02/15/2024 NONE DETECTED  NONE DETECTED Final   Tetrahydrocannabinol 02/15/2024 POSITIVE (A)  NONE DETECTED Final   Barbiturates 02/15/2024 NONE DETECTED  NONE DETECTED Final   Comment: (NOTE) DRUG SCREEN FOR MEDICAL PURPOSES ONLY.  IF CONFIRMATION IS NEEDED FOR ANY PURPOSE, NOTIFY LAB WITHIN 5 DAYS.  LOWEST DETECTABLE LIMITS FOR URINE DRUG SCREEN Drug Class                     Cutoff (ng/mL) Amphetamine and metabolites    1000 Barbiturate and metabolites    200 Benzodiazepine                 200 Opiates and metabolites        300 Cocaine and metabolites        300 THC                            50 Performed at [facility], [address] [place], [place] [address]    Color, Urine 02/15/2024 YELLOW  YELLOW Final   APPearance 02/15/2024 CLEAR  CLEAR Final   Specific Gravity, Urine 02/15/2024 1.008  1.005 - 1.030 Final   pH 02/15/2024 7.0  5.0 - 8.0 Final   Glucose, UA 02/15/2024 NEGATIVE  NEGATIVE mg/dL Final   Hgb urine dipstick 02/15/2024 NEGATIVE  NEGATIVE Final   Bilirubin Urine 02/15/2024 NEGATIVE  NEGATIVE Final   Ketones, ur 02/15/2024 NEGATIVE  NEGATIVE mg/dL Final   Protein, ur 88/71/7974 NEGATIVE  NEGATIVE mg/dL Final   Nitrite 88/71/7974 NEGATIVE  NEGATIVE Final   Leukocytes,Ua 02/15/2024 NEGATIVE  NEGATIVE Final   Performed at [facility], [address], [place], [place] [address]  Admission on 02/03/2024, Discharged on 02/08/2024  Component Date Value Ref Range Status   Folate 02/05/2024 12.5  >5.9 ng/mL Final   Comment: HEMOLYSIS AT THIS LEVEL MAY AFFECT RESULT Performed at [facility], [address] [place], [place] [address]    Hgb A1c MFr Bld 02/05/2024 5.1  4.8 - 5.6 % Final   Comment: (NOTE) Diagnosis of Diabetes The following HbA1c ranges recommended by the American Diabetes Association (ADA) may be used as an aid in the diagnosis of diabetes mellitus.  Hemoglobin             Suggested A1C NGSP%               Diagnosis  <5.7                   Non Diabetic  5.7-6.4                Pre-Diabetic  >6.4                   Diabetic  <7.0                   Glycemic control for                       adults with diabetes.  Mean Plasma Glucose 02/05/2024 99.67  mg/dL Final   Performed at [facility], [address] [place], [place] [address]   Cholesterol 02/05/2024 154  0 - 200 mg/dL Final   Comment:        ATP III CLASSIFICATION:  <200     mg/dL   Desirable  799-760  mg/dL   Borderline High  >=759    mg/dL   High           Triglycerides 02/05/2024 220 (H)  <150 mg/dL Final   HDL 88/81/7974 30 (L)  >40 mg/dL Final   Total CHOL/HDL Ratio 02/05/2024 5.1  RATIO Final   VLDL 02/05/2024 44 (H)  0 - 40 mg/dL Final   LDL Cholesterol 02/05/2024 80  0 - 99 mg/dL Final   Comment:        Total Cholesterol/HDL:CHD Risk Coronary Heart Disease Risk Table                     Men   Women  1/2 Average Risk   3.4   3.3  Average Risk       5.0   4.4  2 X Average Risk   9.6   7.1  3 X Average Risk  23.4   11.0        Use the calculated Patient Ratio above and the CHD Risk Table to determine the patient's CHD Risk.        ATP III CLASSIFICATION (LDL):  <100     mg/dL   Optimal  899-870  mg/dL   Near or Above                    Optimal  130-159  mg/dL   Borderline  839-810  mg/dL   High  >809     mg/dL   Very High Performed at [facility], [address], [place], [place] [address]    TSH 02/05/2024 1.110  0.350 - 4.500 uIU/mL Final   Performed at [facility], [address] [place], [place] [address]   Vitamin B-12 02/05/2024 517  180 - 914 pg/mL Final   Performed at [facility], [address] [place], [place] [address]   Vit D, 25-Hydroxy 02/05/2024 28.74 (L)  30 - 100 ng/mL Final   Comment: (NOTE) Vitamin D  deficiency has been defined by the Institute of Medicine  and an Endocrine Society practice guideline as a  level of serum 25-OH  vitamin D  less than 20 ng/mL (1,2). The Endocrine Society went on to  further define vitamin D  insufficiency as a level between 21 and 29  ng/mL (2).  1. IOM (Institute of Medicine). 2010. Dietary reference intakes for  calcium and D. [place]: [employer]. 2. Holick MF, Binkley [place], Bischoff-Ferrari HA, et al. Evaluation,  treatment, and prevention of vitamin D  deficiency: an Endocrine  Society clinical practice guideline, JCEM. 2011 Jul; 96(7): 1911-30.  Performed at [facility], [address], [place], [place] [address]   Admission on 02/02/2024, Discharged on 02/03/2024  Component Date Value Ref Range Status   Sodium 02/03/2024 135  135 - 145 mmol/L Final   Potassium 02/03/2024 4.0  3.5 - 5.1 mmol/L Final   Chloride 02/03/2024 102  98 - 111 mmol/L Final   CO2 02/03/2024 23  22 - 32 mmol/L Final   Glucose, Bld 02/03/2024 110 (H)  70 - 99 mg/dL Final  Glucose reference range applies only to samples taken after fasting for at least 8 hours.   BUN 02/03/2024 6  6 - 20 mg/dL Final   Creatinine, Ser 02/03/2024 0.73  0.61 - 1.24 mg/dL Final   Calcium 88/83/7974 9.0  8.9 - 10.3 mg/dL Final   Total Protein 88/83/7974 7.5  6.5 - 8.1 g/dL Final   Albumin 88/83/7974 3.0 (L)  3.5 - 5.0 g/dL Final   AST 88/83/7974 72 (H)  15 - 41 U/L Final   ALT 02/03/2024 87 (H)  0 - 44 U/L Final   Alkaline Phosphatase 02/03/2024 60  38 - 126 U/L Final   Total Bilirubin 02/03/2024 1.0  0.0 - 1.2 mg/dL Final   GFR, Estimated 02/03/2024 >60  >60 mL/min Final   Comment: (NOTE) Calculated using the CKD-EPI Creatinine Equation (2021)    Anion gap 02/03/2024 10  5 - 15 Final   Performed at [facility], [address] [place], [place] [address]   WBC 02/03/2024 7.1  4.0 - 10.5 K/uL Final   RBC 02/03/2024 4.23  4.22 - 5.81 MIL/uL Final   Hemoglobin 02/03/2024 13.2  13.0 - 17.0 g/dL Final   HCT 88/83/7974 39.6  39.0 - 52.0 % Final   MCV 02/03/2024  93.6  80.0 - 100.0 fL Final   MCH 02/03/2024 31.2  26.0 - 34.0 pg Final   MCHC 02/03/2024 33.3  30.0 - 36.0 g/dL Final   RDW 88/83/7974 14.4  11.5 - 15.5 % Final   Platelets 02/03/2024 408 (H)  150 - 400 K/uL Final   nRBC 02/03/2024 0.0  0.0 - 0.2 % Final   Neutrophils Relative % 02/03/2024 57  % Final   Neutro Abs 02/03/2024 4.1  1.7 - 7.7 K/uL Final   Lymphocytes Relative 02/03/2024 22  % Final   Lymphs Abs 02/03/2024 1.5  0.7 - 4.0 K/uL Final   Monocytes Relative 02/03/2024 15  % Final   Monocytes Absolute 02/03/2024 1.1 (H)  0.1 - 1.0 K/uL Final   Eosinophils Relative 02/03/2024 5  % Final   Eosinophils Absolute 02/03/2024 0.3  0.0 - 0.5 K/uL Final   Basophils Relative 02/03/2024 1  % Final   Basophils Absolute 02/03/2024 0.1  0.0 - 0.1 K/uL Final   Immature Granulocytes 02/03/2024 0  % Final   Abs Immature Granulocytes 02/03/2024 0.02  0.00 - 0.07 K/uL Final   Performed at [facility], [address], [place], [place] [address]   Alcohol, Ethyl (B) 02/03/2024 <15  <15 mg/dL Final   Comment: (NOTE) For medical purposes only. Performed at [facility], [address] [place], [place] [address]    Color, Urine 02/02/2024 AMBER (A)  YELLOW Final   BIOCHEMICALS MAY BE AFFECTED BY COLOR   APPearance 02/02/2024 CLEAR  CLEAR Final   Specific Gravity, Urine 02/02/2024 1.024  1.005 - 1.030 Final   pH 02/02/2024 6.0  5.0 - 8.0 Final   Glucose, UA 02/02/2024 NEGATIVE  NEGATIVE mg/dL Final   Hgb urine dipstick 02/02/2024 NEGATIVE  NEGATIVE Final   Bilirubin Urine 02/02/2024 NEGATIVE  NEGATIVE Final   Ketones, ur 02/02/2024 NEGATIVE  NEGATIVE mg/dL Final   Protein, ur 88/84/7974 100 (A)  NEGATIVE mg/dL Final   Nitrite 88/84/7974 NEGATIVE  NEGATIVE Final   Leukocytes,Ua 02/02/2024 NEGATIVE  NEGATIVE Final   RBC / HPF 02/02/2024 0-5  0 - 5 RBC/hpf Final   WBC, UA 02/02/2024 0-5  0 - 5 WBC/hpf Final   Bacteria,  UA 02/02/2024 RARE (A)  NONE SEEN Final   Squamous Epithelial /  HPF 02/02/2024 0-5  0 - 5 /HPF Final   Mucus 02/02/2024 PRESENT   Final   Performed at [facility], [address], [place], [place] [address]   POC Amphetamine UR 02/02/2024 Positive (A)  NONE DETECTED (Cut Off Level 1000 ng/mL) Final   POC Secobarbital (BAR) 02/02/2024 None Detected  NONE DETECTED (Cut Off Level 300 ng/mL) Final   POC Buprenorphine (BUP) 02/02/2024 None Detected  NONE DETECTED (Cut Off Level 10 ng/mL) Final   POC Oxazepam (BZO) 02/02/2024 Positive (A)  NONE DETECTED (Cut Off Level 300 ng/mL) Final   POC Cocaine UR 02/02/2024 Positive (A)  NONE DETECTED (Cut Off Level 300 ng/mL) Final   POC Methamphetamine UR 02/02/2024 Positive (A)  NONE DETECTED (Cut Off Level 1000 ng/mL) Final   POC Morphine 02/02/2024 None Detected  NONE DETECTED (Cut Off Level 300 ng/mL) Final   POC Methadone UR 02/02/2024 None Detected  NONE DETECTED (Cut Off Level 300 ng/mL) Final   POC Oxycodone  UR 02/02/2024 None Detected  NONE DETECTED (Cut Off Level 100 ng/mL) Final   POC Marijuana UR 02/02/2024 Positive (A)  NONE DETECTED (Cut Off Level 50 ng/mL) Final  Admission on 01/13/2024, Discharged on 01/14/2024  Component Date Value Ref Range Status   Sodium 01/13/2024 137  135 - 145 mmol/L Final   Potassium 01/13/2024 3.4 (L)  3.5 - 5.1 mmol/L Final   Chloride 01/13/2024 100  98 - 111 mmol/L Final   CO2 01/13/2024 23  22 - 32 mmol/L Final   Glucose, Bld 01/13/2024 90  70 - 99 mg/dL Final   Glucose reference range applies only to samples taken after fasting for at least 8 hours.   BUN 01/13/2024 9  6 - 20 mg/dL Final   Creatinine, Ser 01/13/2024 0.80  0.61 - 1.24 mg/dL Final   Calcium 89/73/7974 8.6 (L)  8.9 - 10.3 mg/dL Final   Total Protein 89/73/7974 7.8  6.5 - 8.1 g/dL Final   Albumin 89/73/7974 3.3 (L)  3.5 - 5.0 g/dL Final   AST 89/73/7974 52 (H)  15 - 41 U/L Final   ALT 01/13/2024 83 (H)  0 - 44 U/L Final   Alkaline Phosphatase 01/13/2024 55  38 - 126 U/L Final   Total Bilirubin  01/13/2024 0.7  0.0 - 1.2 mg/dL Final   GFR, Estimated 01/13/2024 >60  >60 mL/min Final   Comment: (NOTE) Calculated using the CKD-EPI Creatinine Equation (2021)    Anion gap 01/13/2024 14  5 - 15 Final   Performed at [facility], [address] [place], [place] [address]   Alcohol, Ethyl (B) 01/13/2024 <15  <15 mg/dL Final   Comment: (NOTE) For medical purposes only. Performed at [facility], [address] [place], [place] [address]    WBC 01/13/2024 7.1  4.0 - 10.5 K/uL Final   RBC 01/13/2024 4.05 (L)  4.22 - 5.81 MIL/uL Final   Hemoglobin 01/13/2024 12.7 (L)  13.0 - 17.0 g/dL Final   HCT 89/73/7974 38.0 (L)  39.0 - 52.0 % Final   MCV 01/13/2024 93.8  80.0 - 100.0 fL Final   MCH 01/13/2024 31.4  26.0 - 34.0 pg Final   MCHC 01/13/2024 33.4  30.0 - 36.0 g/dL Final   RDW 89/73/7974 14.1  11.5 - 15.5 % Final   Platelets 01/13/2024 426 (H)  150 - 400 K/uL Final   nRBC 01/13/2024 0.0  0.0 - 0.2 % Final   Performed at [facility], [address], [place], [place] [address]   SARS Coronavirus 2 by RT PCR 01/13/2024 NEGATIVE  NEGATIVE Final   Influenza A by PCR 01/13/2024 NEGATIVE  NEGATIVE Final   Influenza B by PCR 01/13/2024 NEGATIVE  NEGATIVE Final   Comment: (NOTE) The Xpert Xpress SARS-CoV-2/FLU/RSV plus assay is intended as an aid in the diagnosis of influenza from Nasopharyngeal swab specimens and should not be used as a sole basis for treatment. Nasal washings and aspirates are unacceptable for Xpert Xpress SARS-CoV-2/FLU/RSV testing.  Fact Sheet for Patients: bloggercourse.com  Fact Sheet for Healthcare Providers: seriousbroker.it  This test is not yet approved or cleared by the [place]  FDA and has been authorized for detection and/or diagnosis of SARS-CoV-2 by FDA under an Emergency Use Authorization (EUA). This EUA will remain in effect (meaning this test can be used) for the duration of  the COVID-19 declaration under Section 564(b)(1) of the Act, 21 U.S.C. section 360bbb-3(b)(1), unless the authorization is terminated or revoked.     Resp Syncytial Virus by PCR 01/13/2024 NEGATIVE  NEGATIVE Final   Comment: (NOTE) Fact Sheet for Patients: bloggercourse.com  Fact Sheet for Healthcare Providers: seriousbroker.it  This test is not yet approved or cleared by the [place]  FDA and has been authorized for detection and/or diagnosis of SARS-CoV-2 by FDA under an Emergency Use Authorization (EUA). This EUA will remain in effect (meaning this test can be used) for the duration of the COVID-19 declaration under Section 564(b)(1) of the Act, 21 U.S.C. section 360bbb-3(b)(1), unless the authorization is terminated or revoked.  Performed at [facility], [address], [place], [place] [address]    Group A Strep by PCR 01/13/2024 NOT DETECTED  NOT DETECTED Final   Performed at [facility], [address], [place], [place] [address]   Opiates 01/14/2024 NONE DETECTED  NONE DETECTED Final   Cocaine 01/14/2024 POSITIVE (A)  NONE DETECTED Final   Benzodiazepines 01/14/2024 POSITIVE (A)  NONE DETECTED Final   Amphetamines 01/14/2024 POSITIVE (A)  NONE DETECTED Final   Comment: (NOTE) Trazodone  is metabolized in vivo to several metabolites, including pharmacologically active m-CPP, which is excreted in the urine. Immunoassay screens for amphetamines and MDMA have potential cross-reactivity with these compounds and may provide false positive  results.     Tetrahydrocannabinol 01/14/2024 POSITIVE (A)  NONE DETECTED Final   Barbiturates 01/14/2024 NONE DETECTED  NONE DETECTED Final   Comment: (NOTE) DRUG SCREEN FOR MEDICAL PURPOSES ONLY.  IF CONFIRMATION IS NEEDED FOR ANY PURPOSE, NOTIFY LAB WITHIN 5 DAYS.  LOWEST DETECTABLE LIMITS FOR URINE DRUG SCREEN Drug Class                     Cutoff  (ng/mL) Amphetamine and metabolites    1000 Barbiturate and metabolites    200 Benzodiazepine                 200 Opiates and metabolites        300 Cocaine and metabolites        300 THC                            50 Performed at [facility], [address] [place], [place] [address]   Admission on 01/07/2024, Discharged on 01/07/2024  Component Date Value Ref Range Status   Sodium 01/07/2024 133 (  L)  135 - 145 mmol/L Final   Potassium 01/07/2024 3.6  3.5 - 5.1 mmol/L Final   Chloride 01/07/2024 98  98 - 111 mmol/L Final   CO2 01/07/2024 24  22 - 32 mmol/L Final   Glucose, Bld 01/07/2024 106 (H)  70 - 99 mg/dL Final   Glucose reference range applies only to samples taken after fasting for at least 8 hours.   BUN 01/07/2024 6  6 - 20 mg/dL Final   Creatinine, Ser 01/07/2024 0.80  0.61 - 1.24 mg/dL Final   Calcium 89/79/7974 9.2  8.9 - 10.3 mg/dL Final   Total Protein 89/79/7974 7.9  6.5 - 8.1 g/dL Final   Albumin 89/79/7974 3.9  3.5 - 5.0 g/dL Final   AST 89/79/7974 106 (H)  15 - 41 U/L Final   ALT 01/07/2024 131 (H)  0 - 44 U/L Final   Alkaline Phosphatase 01/07/2024 78  38 - 126 U/L Final   Total Bilirubin 01/07/2024 0.8  0.0 - 1.2 mg/dL Final   GFR, Estimated 01/07/2024 >60  >60 mL/min Final   Comment: (NOTE) Calculated using the CKD-EPI Creatinine Equation (2021)    Anion gap 01/07/2024 11  5 - 15 Final   Performed at [facility], [address] [place], [place] [address]   Alcohol, Ethyl (B) 01/07/2024 <15  <15 mg/dL Final   Comment: (NOTE) For medical purposes only. Performed at [facility], [address] [place], [place] [address]    Opiates 01/07/2024 NEGATIVE  NEGATIVE Final   Cocaine 01/07/2024 POSITIVE (A)  NEGATIVE Final   Benzodiazepines 01/07/2024 NEGATIVE  NEGATIVE Final   Amphetamines 01/07/2024 POSITIVE (A)  NEGATIVE Final   Tetrahydrocannabinol 01/07/2024 NEGATIVE  NEGATIVE Final   Barbiturates 01/07/2024  NEGATIVE  NEGATIVE Final   Methadone Scn, Ur 01/07/2024 NEGATIVE  NEGATIVE Final   Fentanyl  01/07/2024 NEGATIVE  NEGATIVE Final   Comment: (NOTE) Drug screen is for Medical Purposes only. Positive results are preliminary only. If confirmation is needed, notify lab within 5 days.  Drug Class                 Cutoff (ng/mL) Amphetamine and metabolites 1000 Barbiturate and metabolites 200 Benzodiazepine              200 Opiates and metabolites     300 Cocaine and metabolites     300 THC                         50 Fentanyl                     5 Methadone                   300  Trazodone  is metabolized in vivo to several metabolites,  including pharmacologically active m-CPP, which is excreted in the  urine.  Immunoassay screens for amphetamines and MDMA have potential  cross-reactivity with these compounds and may provide false positive  result.  Performed at [facility], [address], [place], [place] [address]    WBC 01/07/2024 9.5  4.0 - 10.5 K/uL Final   RBC 01/07/2024 4.30  4.22 - 5.81 MIL/uL Final   Hemoglobin 01/07/2024 13.0  13.0 - 17.0 g/dL Final   HCT 89/79/7974 39.7  39.0 - 52.0 % Final   MCV 01/07/2024 92.3  80.0 - 100.0 fL Final   MCH 01/07/2024 30.2  26.0 - 34.0 pg Final  MCHC 01/07/2024 32.7  30.0 - 36.0 g/dL Final   RDW 89/79/7974 13.2  11.5 - 15.5 % Final   Platelets 01/07/2024 385  150 - 400 K/uL Final   nRBC 01/07/2024 0.0  0.0 - 0.2 % Final   Neutrophils Relative % 01/07/2024 70  % Final   Neutro Abs 01/07/2024 6.5  1.7 - 7.7 K/uL Final   Lymphocytes Relative 01/07/2024 17  % Final   Lymphs Abs 01/07/2024 1.6  0.7 - 4.0 K/uL Final   Monocytes Relative 01/07/2024 11  % Final   Monocytes Absolute 01/07/2024 1.0  0.1 - 1.0 K/uL Final   Eosinophils Relative 01/07/2024 2  % Final   Eosinophils Absolute 01/07/2024 0.2  0.0 - 0.5 K/uL Final   Basophils Relative 01/07/2024 0  % Final   Basophils Absolute 01/07/2024 0.0  0.0 - 0.1 K/uL Final    Immature Granulocytes 01/07/2024 0  % Final   Abs Immature Granulocytes 01/07/2024 0.04  0.00 - 0.07 K/uL Final   Performed at [facility], [address], [place], [place] [address]  Admission on 01/06/2024, Discharged on 01/07/2024  Component Date Value Ref Range Status   Group A Strep by PCR 01/07/2024 NOT DETECTED  NOT DETECTED Final   Performed at [facility], [address] [place], [place] [address]   SARS Coronavirus 2 by RT PCR 01/07/2024 NEGATIVE  NEGATIVE Final   Influenza A by PCR 01/07/2024 NEGATIVE  NEGATIVE Final   Influenza B by PCR 01/07/2024 NEGATIVE  NEGATIVE Final   Comment: (NOTE) The Xpert Xpress SARS-CoV-2/FLU/RSV plus assay is intended as an aid in the diagnosis of influenza from Nasopharyngeal swab specimens and should not be used as a sole basis for treatment. Nasal washings and aspirates are unacceptable for Xpert Xpress SARS-CoV-2/FLU/RSV testing.  Fact Sheet for Patients: bloggercourse.com  Fact Sheet for Healthcare Providers: seriousbroker.it  This test is not yet approved or cleared by the [place]  FDA and has been authorized for detection and/or diagnosis of SARS-CoV-2 by FDA under an Emergency Use Authorization (EUA). This EUA will remain in effect (meaning this test can be used) for the duration of the COVID-19 declaration under Section 564(b)(1) of the Act, 21 U.S.C. section 360bbb-3(b)(1), unless the authorization is terminated or revoked.     Resp Syncytial Virus by PCR 01/07/2024 NEGATIVE  NEGATIVE Final   Comment: (NOTE) Fact Sheet for Patients: bloggercourse.com  Fact Sheet for Healthcare Providers: seriousbroker.it  This test is not yet approved or cleared by the [place]  FDA and has been authorized for detection and/or diagnosis of SARS-CoV-2 by FDA under an Emergency Use Authorization (EUA). This  EUA will remain in effect (meaning this test can be used) for the duration of the COVID-19 declaration under Section 564(b)(1) of the Act, 21 U.S.C. section 360bbb-3(b)(1), unless the authorization is terminated or revoked.  Performed at [facility], [address], [place], [place] [address]   Admission on 01/06/2024, Discharged on 01/06/2024  Component Date Value Ref Range Status   Sodium 01/06/2024 135  135 - 145 mmol/L Final   Potassium 01/06/2024 3.3 (L)  3.5 - 5.1 mmol/L Final   Chloride 01/06/2024 99  98 - 111 mmol/L Final   CO2 01/06/2024 22  22 - 32 mmol/L Final   Glucose, Bld 01/06/2024 104 (H)  70 - 99 mg/dL Final   Glucose reference range applies only to samples taken after fasting for at least 8 hours.   BUN 01/06/2024 10  6 - 20 mg/dL Final   Creatinine, Ser 01/06/2024 0.85  0.61 - 1.24 mg/dL Final   Calcium 89/80/7974 9.3  8.9 - 10.3 mg/dL Final   Total Protein 89/80/7974 8.5 (H)  6.5 - 8.1 g/dL Final   Albumin 89/80/7974 4.2  3.5 - 5.0 g/dL Final   AST 89/80/7974 107 (H)  15 - 41 U/L Final   ALT 01/06/2024 132 (H)  0 - 44 U/L Final   Alkaline Phosphatase 01/06/2024 77  38 - 126 U/L Final   Total Bilirubin 01/06/2024 0.7  0.0 - 1.2 mg/dL Final   GFR, Estimated 01/06/2024 >60  >60 mL/min Final   Comment: (NOTE) Calculated using the CKD-EPI Creatinine Equation (2021)    Anion gap 01/06/2024 14  5 - 15 Final   Performed at [facility], [address] [place], [place] [address]   Alcohol, Ethyl (B) 01/06/2024 <15  <15 mg/dL Final   Comment: (NOTE) For medical purposes only. Performed at [facility], [address] [place], [place] [address]    WBC 01/06/2024 7.8  4.0 - 10.5 K/uL Final   RBC 01/06/2024 4.25  4.22 - 5.81 MIL/uL Final   Hemoglobin 01/06/2024 13.0  13.0 - 17.0 g/dL Final   HCT 89/80/7974 38.8 (L)  39.0 - 52.0 % Final   MCV 01/06/2024 91.3  80.0 - 100.0 fL Final   MCH 01/06/2024 30.6  26.0 - 34.0 pg  Final   MCHC 01/06/2024 33.5  30.0 - 36.0 g/dL Final   RDW 89/80/7974 13.2  11.5 - 15.5 % Final   Platelets 01/06/2024 404 (H)  150 - 400 K/uL Final   nRBC 01/06/2024 0.0  0.0 - 0.2 % Final   Performed at [facility], [address], [place], [place] [address]   Opiates 01/06/2024 NEGATIVE  NEGATIVE Final   Cocaine 01/06/2024 POSITIVE (A)  NEGATIVE Final   Benzodiazepines 01/06/2024 POSITIVE (A)  NEGATIVE Final   Amphetamines 01/06/2024 POSITIVE (A)  NEGATIVE Final   Tetrahydrocannabinol 01/06/2024 POSITIVE (A)  NEGATIVE Final   Barbiturates 01/06/2024 NEGATIVE  NEGATIVE Final   Methadone Scn, Ur 01/06/2024 NEGATIVE  NEGATIVE Final   Fentanyl  01/06/2024 NEGATIVE  NEGATIVE Final   Comment: (NOTE) Drug screen is for Medical Purposes only. Positive results are preliminary only. If confirmation is needed, notify lab within 5 days.  Drug Class                 Cutoff (ng/mL) Amphetamine and metabolites 1000 Barbiturate and metabolites 200 Benzodiazepine              200 Opiates and metabolites     300 Cocaine and metabolites     300 THC                         50 Fentanyl                     5 Methadone                   300  Trazodone  is metabolized in vivo to several metabolites,  including pharmacologically active m-CPP, which is excreted in the  urine.  Immunoassay screens for amphetamines and MDMA have potential  cross-reactivity with these compounds and may provide false positive  result.  Performed at [facility], [address] [place], [place] [address]    Acetaminophen  (Tylenol ), Serum 01/06/2024 <10 (L)  10 - 30  ug/mL Final   Comment: (NOTE) Toxic concentrations can be more effectively related to post dose interval; > 200, > 100, and > 50 ug/mL serum concentrations correspond to toxic concentrations at 4, 8, and 12 hours post dose, respectively.  Performed at [facility], [address] [place],  [place] [address]    Salicylate Lvl 01/06/2024 <7.0 (L)  7.0 - 30.0 mg/dL Final   Performed at [facility], [address], [place], [place] [address]   Color, Urine 01/06/2024 AMBER (A)  YELLOW Final   BIOCHEMICALS MAY BE AFFECTED BY COLOR   APPearance 01/06/2024 HAZY (A)  CLEAR Final   Specific Gravity, Urine 01/06/2024 1.026  1.005 - 1.030 Final   pH 01/06/2024 5.0  5.0 - 8.0 Final   Glucose, UA 01/06/2024 NEGATIVE  NEGATIVE mg/dL Final   Hgb urine dipstick 01/06/2024 SMALL (A)  NEGATIVE Final   Bilirubin Urine 01/06/2024 NEGATIVE  NEGATIVE Final   Ketones, ur 01/06/2024 5 (A)  NEGATIVE mg/dL Final   Protein, ur 89/80/7974 100 (A)  NEGATIVE mg/dL Final   Nitrite 89/80/7974 NEGATIVE  NEGATIVE Final   Leukocytes,Ua 01/06/2024 NEGATIVE  NEGATIVE Final   RBC / HPF 01/06/2024 11-20  0 - 5 RBC/hpf Final   WBC, UA 01/06/2024 11-20  0 - 5 WBC/hpf Final   Bacteria, UA 01/06/2024 RARE (A)  NONE SEEN Final   Squamous Epithelial / HPF 01/06/2024 0-5  0 - 5 /HPF Final   Mucus 01/06/2024 PRESENT   Final   Hyaline Casts, UA 01/06/2024 PRESENT   Final   Performed at [facility], [address] [place], [place] [address]  Admission on 01/04/2024, Discharged on 01/05/2024  Component Date Value Ref Range Status   RPR Ser Ql 01/04/2024 NON REACTIVE  NON REACTIVE Final   Performed at [facility], [address], [place], [place] [address]   HIV Screen 4th Generation wRfx 01/04/2024 Non Reactive  Non Reactive Final   Performed at [facility], [address] [place], [place] [address]   Neisseria Gonorrhea 01/05/2024 Negative   Final   Chlamydia 01/05/2024 Negative   Final   Comment 01/05/2024 Normal Reference Ranger Chlamydia - Negative   Final   Comment 01/05/2024 Normal Reference Range Neisseria Gonorrhea - Negative   Final  Admission on 01/04/2024, Discharged on 01/04/2024  Component Date Value Ref Range Status   Sodium 01/04/2024 133 (L)  135 - 145  mmol/L Final   Potassium 01/04/2024 3.2 (L)  3.5 - 5.1 mmol/L Final   Chloride 01/04/2024 99  98 - 111 mmol/L Final   CO2 01/04/2024 21 (L)  22 - 32 mmol/L Final   Glucose, Bld 01/04/2024 117 (H)  70 - 99 mg/dL Final   Glucose reference range applies only to samples taken after fasting for at least 8 hours.   BUN 01/04/2024 5 (L)  6 - 20 mg/dL Final   Creatinine, Ser 01/04/2024 0.94  0.61 - 1.24 mg/dL Final   Calcium 89/82/7974 9.2  8.9 - 10.3 mg/dL Final   Total Protein 89/82/7974 9.1 (H)  6.5 - 8.1 g/dL Final   Albumin 89/82/7974 3.9  3.5 - 5.0 g/dL Final   AST 89/82/7974 85 (H)  15 - 41 U/L Final   ALT 01/04/2024 103 (H)  0 - 44 U/L Final   Alkaline Phosphatase 01/04/2024 77  38 - 126 U/L Final   Total Bilirubin 01/04/2024 1.5 (H)  0.0 - 1.2 mg/dL Final  GFR, Estimated 01/04/2024 >60  >60 mL/min Final   Comment: (NOTE) Calculated using the CKD-EPI Creatinine Equation (2021)    Anion gap 01/04/2024 13  5 - 15 Final   Performed at [facility], [address] [place], [place] [address]   Alcohol, Ethyl (B) 01/04/2024 <15  <15 mg/dL Final   Comment: (NOTE) For medical purposes only. Performed at [facility], [address], [place], [place] [address]    Opiates 01/04/2024 NONE DETECTED  NONE DETECTED Final   Cocaine 01/04/2024 POSITIVE (A)  NONE DETECTED Final   Benzodiazepines 01/04/2024 NONE DETECTED  NONE DETECTED Final   Amphetamines 01/04/2024 POSITIVE (A)  NONE DETECTED Final   Comment: (NOTE) Trazodone  is metabolized in vivo to several metabolites, including pharmacologically active m-CPP, which is excreted in the urine. Immunoassay screens for amphetamines and MDMA have potential cross-reactivity with these compounds and may provide false positive  results.     Tetrahydrocannabinol 01/04/2024 NONE DETECTED  NONE DETECTED Final   Barbiturates 01/04/2024 NONE DETECTED  NONE DETECTED Final   Comment: (NOTE) DRUG SCREEN FOR MEDICAL PURPOSES ONLY.  IF  CONFIRMATION IS NEEDED FOR ANY PURPOSE, NOTIFY LAB WITHIN 5 DAYS.  LOWEST DETECTABLE LIMITS FOR URINE DRUG SCREEN Drug Class                     Cutoff (ng/mL) Amphetamine and metabolites    1000 Barbiturate and metabolites    200 Benzodiazepine                 200 Opiates and metabolites        300 Cocaine and metabolites        300 THC                            50 Performed at [facility], [address] [place], [place] [address]    WBC 01/04/2024 9.8  4.0 - 10.5 K/uL Final   RBC 01/04/2024 4.64  4.22 - 5.81 MIL/uL Final   Hemoglobin 01/04/2024 14.4  13.0 - 17.0 g/dL Final   HCT 89/82/7974 41.9  39.0 - 52.0 % Final   MCV 01/04/2024 90.3  80.0 - 100.0 fL Final   MCH 01/04/2024 31.0  26.0 - 34.0 pg Final   MCHC 01/04/2024 34.4  30.0 - 36.0 g/dL Final   RDW 89/82/7974 12.8  11.5 - 15.5 % Final   Platelets 01/04/2024 422 (H)  150 - 400 K/uL Final   nRBC 01/04/2024 0.0  0.0 - 0.2 % Final   Neutrophils Relative % 01/04/2024 55  % Final   Neutro Abs 01/04/2024 5.5  1.7 - 7.7 K/uL Final   Lymphocytes Relative 01/04/2024 25  % Final   Lymphs Abs 01/04/2024 2.5  0.7 - 4.0 K/uL Final   Monocytes Relative 01/04/2024 16  % Final   Monocytes Absolute 01/04/2024 1.6 (H)  0.1 - 1.0 K/uL Final   Eosinophils Relative 01/04/2024 3  % Final   Eosinophils Absolute 01/04/2024 0.3  0.0 - 0.5 K/uL Final   Basophils Relative 01/04/2024 1  % Final   Basophils Absolute 01/04/2024 0.1  0.0 - 0.1 K/uL Final   Immature Granulocytes 01/04/2024 0  % Final   Abs Immature Granulocytes 01/04/2024 0.03  0.00 - 0.07 K/uL Final   Performed at [facility], [address] [place], [place] [address]  Admission on 09/07/2023, Discharged on 09/11/2023  Component Date  Value Ref Range Status   Sodium 09/09/2023 136  135 - 145 mmol/L Final   Potassium 09/09/2023 4.5  3.5 - 5.1 mmol/L Final   Chloride 09/09/2023 99  98 - 111 mmol/L Final   CO2 09/09/2023 28  22 - 32 mmol/L Final   Glucose, Bld  09/09/2023 84  70 - 99 mg/dL Final   Glucose reference range applies only to samples taken after fasting for at least 8 hours.   BUN 09/09/2023 11  6 - 20 mg/dL Final   Creatinine, Ser 09/09/2023 1.16  0.61 - 1.24 mg/dL Final   Calcium 93/77/7974 9.8  8.9 - 10.3 mg/dL Final   Total Protein 93/77/7974 7.9  6.5 - 8.1 g/dL Final   Albumin 93/77/7974 3.0 (L)  3.5 - 5.0 g/dL Final   AST 93/77/7974 60 (H)  15 - 41 U/L Final   ALT 09/09/2023 59 (H)  0 - 44 U/L Final   Alkaline Phosphatase 09/09/2023 56  38 - 126 U/L Final   Total Bilirubin 09/09/2023 0.6  0.0 - 1.2 mg/dL Final   GFR, Estimated 09/09/2023 >60  >60 mL/min Final   Comment: (NOTE) Calculated using the CKD-EPI Creatinine Equation (2021)    Anion gap 09/09/2023 9  5 - 15 Final   Performed at [facility], [address] [place], [place] [address]   Vitamin B-12 09/09/2023 206  180 - 914 pg/mL Final   Comment: (NOTE) This assay is not validated for testing neonatal or myeloproliferative syndrome specimens for Vitamin B12 levels. Performed at [facility], [address], [place], [place] [address]    Vitamin B6 09/09/2023 7.0  3.4 - 65.2 ug/L Final   Comment: (NOTE) This test was developed and its performance characteristics determined by [facility]. It has not been cleared or approved by the Food and Drug Administration.                             Deficiency:         <3.4                             Marginal:      3.4 - 5.1                             Adequate:           >5.1 Performed At: [facility] [address] [place], [place] [id number] Jennette Shorter MD Ey:[id number]   There may be more visits with results that are not included.    Blood Alcohol level:  Lab Results  Component Value Date   [facility] <15 02/03/2024   ETH <15 01/13/2024    Metabolic Disorder Labs: Lab Results  Component Value Date   HGBA1C 5.1 02/05/2024   MPG 99.67 02/05/2024   MPG 91.06 07/19/2023   No results found for:  PROLACTIN Lab Results  Component Value Date   CHOL 154 02/05/2024   TRIG 220 (H) 02/05/2024   HDL 30 (L) 02/05/2024   CHOLHDL 5.1 02/05/2024   VLDL 44 (H) 02/05/2024   LDLCALC 80 02/05/2024   LDLCALC 102 (H) 07/19/2023    Therapeutic Lab Levels: No results found for: LITHIUM No results found for: VALPROATE No results found for: CBMZ  Physical Findings   AIMS    Flowsheet Row Clinical Support from 05/19/2022  in [facility] Admission [facility]Discharged) [facility]) [facility]  AIMS Total Score 10 0 0   AUDIT    Flowsheet Row ED from 02/15/2024 in [facility] Admission (Discharged) from [facility] ED from 09/07/2023 in [facility] Admission (Discharged) from [facility] Ad[facility] (Discharged) from [facility]  Alcohol Use Disorder Identification Test Final Score (AUDIT) 32 35 24 31 0   GAD-7    Flowsheet Row [facility] Visit from 11/01/2021 in [facility] Video Visit from 03/22/2021 in [facility] Office Visit from 12/21/2020 in [facility]  Total GAD-7 Score 17 18 20 19    [id number]    Flowsheet Row ED from 09/07/2023 in [facility] ED from 09/06/2023 in [facility] ED from 09/05/2023 in [facility] Office Visit from 11/29/2022 in [facility] ED from 07/09/2022 in [facility]  PHQ-2 Total Score 0 2 2 3  0  PHQ-9 Total  Score 0 7 7 12 22    Flowsheet Row ED from 02/15/2024 in [facility] ED from 02/14/2024 in [facility] Emergency Department at [facility] ED from 02/11/2024 in [facility] Emergency Department at [facility]  C-SSRS RISK CATEGORY Moderate Risk Moderate Risk No Risk     Musculoskeletal  Strength & Muscle Tone: within normal limits Gait & Station: normal Patient leans: N/A  Psychiatric Specialty Exam  Presentation  General Appearance:  Appropriate for Environment  Eye Contact: Good  Speech: Clear and Coherent  Speech Volume: Normal  Handedness: Right   Mood and Affect  Mood: Anxious; Depressed  Affect: Congruent   Thought Process  Thought Processes: Coherent  Descriptions of Associations:Intact  Orientation:Full (Time, Place and Person)  Thought Content:Logical  Diagnosis of Schizophrenia or Schizoaffective disorder in past: No  Duration of Psychotic Symptoms: Greater than six months   Hallucinations:Hallucinations: None Description of Auditory Hallucinations: Ongoing hallucinations related to whisper  Ideas of Reference:Paranoia  Suicidal Thoughts:Suicidal Thoughts: No  Homicidal Thoughts:Homicidal Thoughts: No   Sensorium  Memory: Immediate Good; Recent Good  Judgment: Good  Insight: Good   Executive Functions  Concentration: Fair  Attention Span: Good  Recall: Fair  Fund of Knowledge: Good  Language: Good   Psychomotor Activity  Psychomotor Activity: Psychomotor Activity: Normal   Assets  Assets: Desire for Improvement; Communication Skills   Sleep  Sleep: Sleep: Fair  Estimated Sleeping Duration (Last 24 Hours): 12.25-15.50 hours  Nutritional Assessment (For OBS and FBC admissions only) Has the patient had a weight loss or gain of 10 pounds or more in the last 3 months?: No Has the patient had a decrease in food intake/or appetite?: No Does the patient have dental  problems?: No Does the patient have eating habits or behaviors that may be indicators of an eating disorder including binging or inducing vomiting?: No Has the patient recently lost weight without trying?: 0 Has the patient been eating poorly because of a decreased appetite?: 0 Malnutrition Screening Tool Score: 0    Physical Exam  Physical Exam ROS Blood  pressure 112/87, pulse 64, temperature 98.3 F (36.8 C), temperature source Oral, resp. rate 18, SpO2 95%. There is no height or weight on file to calculate BMI.  Treatment Plan Summary: Daily contact with patient to assess and evaluate symptoms and progress in treatment and Medication management  Continue with current treatment plan on 02/17/2024 as listed below except were noted  Substance-induced mood disorder: Alcohol dependency: Schizophrenia paranoia Major depression:  Adjustment: Continue Haldol  5 mg daily Increase Haldol  5 mg nightly to Haldol  7.5 mg nightly Continue CIWA protocol  CSW to continue working on discharge disposition recommendations Patient encouraged to participate in therapeutic milieu  Staci LOISE Kerns, NP 02/17/2024 10:21 AM

## 2024-02-17 NOTE — ED Notes (Signed)
 Patient alert & oriented x4. Denies intent to harm self or others when asked. Denies VH, endorses AH of unintelligible whispers, AH has been ongoing since prior to arrival to the unit. Patient denies any physical complaints when asked. No acute distress noted. Support and encouragement provided. Scheduled medications administered with no complications. Patient observed in milieu. No inappropriate behaviors observed or reported. Routine safety checks conducted per facility protocol. Encouraged patient to notify staff if any thoughts of harm towards self or others arise. Patient verbalizes understanding and agreement.

## 2024-02-18 DIAGNOSIS — F1220 Cannabis dependence, uncomplicated: Secondary | ICD-10-CM | POA: Diagnosis not present

## 2024-02-18 DIAGNOSIS — G4700 Insomnia, unspecified: Secondary | ICD-10-CM | POA: Diagnosis not present

## 2024-02-18 DIAGNOSIS — Z560 Unemployment, unspecified: Secondary | ICD-10-CM | POA: Diagnosis not present

## 2024-02-18 DIAGNOSIS — R45851 Suicidal ideations: Secondary | ICD-10-CM | POA: Diagnosis not present

## 2024-02-18 MED ORDER — LORAZEPAM 1 MG PO TABS
2.0000 mg | ORAL_TABLET | Freq: Once | ORAL | Status: AC
  Administered 2024-02-18: 2 mg via ORAL
  Filled 2024-02-18: qty 2

## 2024-02-18 MED ORDER — HYDROXYZINE HCL 25 MG PO TABS
25.0000 mg | ORAL_TABLET | Freq: Three times a day (TID) | ORAL | Status: DC | PRN
  Administered 2024-02-18: 25 mg via ORAL

## 2024-02-18 NOTE — Group Note (Signed)
 Group Topic: Positive Affirmations  Group Date: 02/18/2024 Start Time: 1230 End Time: 1255 Facilitators: Reinhold Minor BROCKS, RN  Department: [facility]  Number of Participants: 5  Group Focus: affirmation Treatment Modality:  Psychoeducation Interventions utilized were patient education Purpose: express feelings  Name: Jerry [name] Date of Birth: 08-23-1975  MR: [id number]   Level of Participation: minimal Quality of Participation: engaged Interactions with others: gave feedback Mood/Affect: positive Triggers (if applicable): na Cognition: logical Progress: Gaining insight Response: speak positively towards self and situations Plan: patient will be encouraged to speak positively  Patients Problems:  Patient Active Problem List   Diagnosis Date Noted   Alcohol use disorder, severe, dependence (HCC) 02/15/2024   Cocaine use disorder (HCC) 02/15/2024   Mild benzodiazepine use disorder (HCC) 02/06/2024   Protein-calorie malnutrition, severe 07/03/2023   Left arm cellulitis 07/01/2023   Jaw pain 11/29/2022   Need for influenza vaccination 11/29/2022   Erectile dysfunction 11/29/2022   Hyponatremia 11/29/2022   History of hepatitis C virus infection 11/29/2022   Tardive dyskinesia 10/24/2022   Generalized anxiety disorder 10/24/2022   PTSD (post-traumatic stress disorder) 10/24/2022   Healthcare maintenance 10/24/2022   Schizophrenia (HCC) 07/07/2022   Malingering 09/07/2020   Homelessness 09/07/2020   Schizophrenia spectrum disorder with psychotic disorder type not yet determined (HCC) 07/17/2020   Methamphetamine abuse (HCC) 07/17/2020   Marijuana abuse 07/17/2020   Cocaine abuse (HCC) 07/17/2020   Anxiety and depression 06/25/2014   Family history of diabetes mellitus (DM) 06/25/2014   Family history of thyroid  disease 06/25/2014   Tobacco use disorder 06/25/2014   Alcohol abuse 05/18/2013   Polysubstance abuse (HCC) 05/18/2013    Depression, unspecified 05/18/2013

## 2024-02-18 NOTE — ED Notes (Signed)
 PT awakened at nurses station complaining of headache ,Tylenol  650 mg given for the same, Haldol  7.5 mg and Benadyrl 50 given for hallucinations, patient skin warm and dry, denied nausea or diarrhea.Will continue to promote safety.

## 2024-02-18 NOTE — ED Notes (Signed)
 Pt did not receive haldol .  Pt sleeping and would not come to med call/ Anxious earlier and was given hydrozyzine.  Sleeping and did not complain of auditory hallucinations while awake.  Will provide safety.

## 2024-02-18 NOTE — ED Notes (Signed)
 Patient denies pain and is resting comfortably.

## 2024-02-18 NOTE — Group Note (Signed)
 Group Topic: Social Support  Group Date: 02/18/2024 Start Time: 2000 End Time: 2100 Facilitators: Joan Plowman B  Department: [facility]  Number of Participants: 6  Group Focus: daily focus and goals/reality orientation Treatment Modality:  Individual Therapy Interventions utilized were leisure development Purpose: express feelings  Name: Jerry [name] Date of Birth: Sep 17, 1975  MR: [id number]    Level of Participation: PT DID NOT ATTEND GROUPS   Patients Problems:  Patient Active Problem List   Diagnosis Date Noted   Alcohol use disorder, severe, dependence (HCC) 02/15/2024   Cocaine use disorder (HCC) 02/15/2024   Mild benzodiazepine use disorder (HCC) 02/06/2024   Protein-calorie malnutrition, severe 07/03/2023   Left arm cellulitis 07/01/2023   Jaw pain 11/29/2022   Need for influenza vaccination 11/29/2022   Erectile dysfunction 11/29/2022   Hyponatremia 11/29/2022   History of hepatitis C virus infection 11/29/2022   Tardive dyskinesia 10/24/2022   Generalized anxiety disorder 10/24/2022   PTSD (post-traumatic stress disorder) 10/24/2022   Healthcare maintenance 10/24/2022   Schizophrenia (HCC) 07/07/2022   Malingering 09/07/2020   Homelessness 09/07/2020   Schizophrenia spectrum disorder with psychotic disorder type not yet determined (HCC) 07/17/2020   Methamphetamine abuse (HCC) 07/17/2020   Marijuana abuse 07/17/2020   Cocaine abuse (HCC) 07/17/2020   Anxiety and depression 06/25/2014   Family history of diabetes mellitus (DM) 06/25/2014   Family history of thyroid  disease 06/25/2014   Tobacco use disorder 06/25/2014   Alcohol abuse 05/18/2013   Polysubstance abuse (HCC) 05/18/2013   Depression, unspecified 05/18/2013

## 2024-02-18 NOTE — Care Management (Addendum)
 [facility] Care Management...   ADDENDUM 3:06 PM  Patient requested inpatient treatment  Per Debbie at [facility]  Patient has been accepted to their program.   Patient can discharge tomorrow to...  [facility] [address]  [place], [place] [address]  [facility] will arrange transportation  30 day [facility]     ADDENDUM 2:51 PM  Writer coordinated a phone assessment between patient and Marval at [facility].  Writer met with patient to discuss discharge planning...  Patient reported looking for residential treatment.   Patient reported SA using heroine and weed. Patient reported previous diagnosis of Schizophernia.  Writer will refer to [facility] and [employer]

## 2024-02-18 NOTE — Group Note (Signed)
 Group Topic: Overcoming Obstacles  Group Date: 02/18/2024 Start Time: 0200 End Time: 1420 Facilitators: Lonzell Dwayne RAMAN, NT  Department: [facility]  Number of Participants: 3  Group Focus: acceptance Treatment Modality:  Behavior Modification Therapy Interventions utilized were clarification Purpose: increase insight  Name: Jerry [name] Date of Birth: 12-Jul-1975  MR: [id number]    Level of Participation: Patient did attend group Quality of Participation: attentive Interactions with others: gave feedback Mood/Affect: positive Triggers (if applicable): N/A Cognition: coherent/clear Progress: Moderate Response: Appropriate  Plan: follow-up needed  Patients Problems:  Patient Active Problem List   Diagnosis Date Noted   Alcohol use disorder, severe, dependence (HCC) 02/15/2024   Cocaine use disorder (HCC) 02/15/2024   Mild benzodiazepine use disorder (HCC) 02/06/2024   Protein-calorie malnutrition, severe 07/03/2023   Left arm cellulitis 07/01/2023   Jaw pain 11/29/2022   Need for influenza vaccination 11/29/2022   Erectile dysfunction 11/29/2022   Hyponatremia 11/29/2022   History of hepatitis C virus infection 11/29/2022   Tardive dyskinesia 10/24/2022   Generalized anxiety disorder 10/24/2022   PTSD (post-traumatic stress disorder) 10/24/2022   Healthcare maintenance 10/24/2022   Schizophrenia (HCC) 07/07/2022   Malingering 09/07/2020   Homelessness 09/07/2020   Schizophrenia spectrum disorder with psychotic disorder type not yet determined (HCC) 07/17/2020   Methamphetamine abuse (HCC) 07/17/2020   Marijuana abuse 07/17/2020   Cocaine abuse (HCC) 07/17/2020   Anxiety and depression 06/25/2014   Family history of diabetes mellitus (DM) 06/25/2014   Family history of thyroid  disease 06/25/2014   Tobacco use disorder 06/25/2014   Alcohol abuse 05/18/2013   Polysubstance abuse (HCC) 05/18/2013   Depression, unspecified 05/18/2013

## 2024-02-18 NOTE — ED Provider Notes (Signed)
 Behavioral Health Progress Note  Date and Time: 02/18/2024 3:08 PM Name: Jerry [name] MRN:  [id number]  Jerry [name] is a 48 year old male who presented voluntarily to [facility] behavioral health facility based crisis unit from [facility] emergency department on 02/15/2024.  Upon arrival patient requested detox from heroin, alcohol and cocaine. Subjective: Patient states I am ready to go into treatment for my alcohol use.  It is time for me to stop the alcohol and the drugs. Patient denies symptoms of withdrawal currently.  Jerry [name] endorses reduced auditory hallucinations.  Patient states for years I have heard whispers early in the morning or late at night, I do not hear it right now.  Haloperidol  increased on 02/17/2024, current haloperidol  5 mg every morning 7.5 mg nightly.  Patient reports compliance with medication.  Patient continues to deny suicidal and homicidal ideation.  He contracts verbally for safety.  Jerry [name] is reassessed by this nurse practitioner face-to-face.  He is seated, no apparent distress.  He is alert and oriented, pleasant and cooperative during assessment.  He presents with anxious mood.  Patient endorses average sleep and appetite.  He reports actively engaged with groups and meetings while at facility-based crisis unit.  He is visualized actively engaged in milieu, playing cards with a peer during the afternoon.  Reviewed treatment plan to include referrals to [facility], caring services and [facility].  Patient is open to first available treatment facility.  Patient is insightful today, states I am ready to go into treatment, any treatment that I can find.  Patient offered support and encouragement.  Diagnosis:  Final diagnoses:  Polysubstance abuse (HCC)  Alcohol use disorder, severe, dependence (HCC)  Schizophrenia, unspecified type (HCC)  Vitamin D  deficiency    Total Time spent with patient: 20 minutes  Past Psychiatric History: GAD,  bipolar 1 disorder, MDD, alcohol use disorder, hallucination, paranoid schizophrenia, PTSD, cocaine use disorder, polysubstance abuse, mild benzodiazepine use disorder, methamphetamine use disorder, marijuana use disorder Past Medical History: HCV, left arm cellulitis, tardive dyskinesia, erectile dysfunction, hyponatremia Family Psychiatric  History: None reported Social History: Patient most recently without consistent housing, is able to reside in hotels when he has the financial means to do so.  Patient endorses alcohol opioid and stimulant use.  Patient is not currently employed, receives income.  Additional Social History:                         Sleep: Fair  Appetite:  Fair  Current Medications:  Current Facility-Administered Medications  Medication Dose Route Frequency Provider Last Rate Last Admin   acetaminophen  (TYLENOL ) tablet 650 mg  650 mg Oral Q6H PRN Mannie Jerel PARAS, NP   650 mg at 02/18/24 0137   alum & mag hydroxide-simeth (MAALOX/MYLANTA) 200-200-20 MG/5ML suspension 30 mL  30 mL Oral Q4H PRN Mannie Jerel PARAS, NP       cholecalciferol  (VITAMIN D3) 25 MCG (1000 UNIT) tablet 2,000 Units  2,000 Units Oral Daily Hobson, Fran E, NP   2,000 Units at 02/18/24 9062   haloperidol  (HALDOL ) tablet 5 mg  5 mg Oral TID PRN Mannie Jerel PARAS, NP       And   diphenhydrAMINE  (BENADRYL ) capsule 50 mg  50 mg Oral TID PRN Mannie Jerel PARAS, NP   50 mg at 02/18/24 0142   haloperidol  lactate (HALDOL ) injection 5 mg  5 mg Intramuscular TID PRN Mannie Jerel PARAS, NP       And  diphenhydrAMINE  (BENADRYL ) injection 50 mg  50 mg Intramuscular TID PRN Mannie Jerel PARAS, NP       And   LORazepam  (ATIVAN ) injection 2 mg  2 mg Intramuscular TID PRN Mannie Jerel PARAS, NP       haloperidol  lactate (HALDOL ) injection 10 mg  10 mg Intramuscular TID PRN Mannie Jerel PARAS, NP       And   diphenhydrAMINE  (BENADRYL ) injection 50 mg  50 mg Intramuscular TID PRN Mannie Jerel PARAS, NP       And    LORazepam  (ATIVAN ) injection 2 mg  2 mg Intramuscular TID PRN Mannie Jerel PARAS, NP       haloperidol  (HALDOL ) tablet 5 mg  5 mg Oral Daily Ezzard Staci SAILOR, NP   5 mg at 02/18/24 9062   And   haloperidol  (HALDOL ) tablet 7.5 mg  7.5 mg Oral QHS Ezzard Staci SAILOR, NP   7.5 mg at 02/18/24 0139   magnesium  hydroxide (MILK OF MAGNESIA) suspension 30 mL  30 mL Oral Daily PRN Mannie Jerel PARAS, NP       multivitamin with minerals tablet 1 tablet  1 tablet Oral Daily Mannie Jerel PARAS, NP   1 tablet at 02/18/24 9062   nicotine  (NICODERM CQ  - dosed in mg/24 hours) patch 21 mg  21 mg Transdermal Q0600 Mannie Jerel PARAS, NP   21 mg at 02/18/24 9061   thiamine  (VITAMIN B1) tablet 100 mg  100 mg Oral Daily Mannie Jerel PARAS, NP   100 mg at 02/18/24 9062   Current Outpatient Medications  Medication Sig Dispense Refill   cholecalciferol  (CHOLECALCIFEROL ) 25 MCG tablet Take 1 tablet (1,000 Units total) by mouth daily. (Patient not taking: Reported on 02/15/2024) 30 tablet 0   haloperidol  (HALDOL ) 5 MG tablet Take 1 tablet (5 mg total) by mouth 2 (two) times daily. (Patient not taking: Reported on 02/15/2024) 60 tablet 0   Multiple Vitamin (MULTIVITAMIN WITH MINERALS) TABS tablet Take 1 tablet by mouth daily. (Patient not taking: Reported on 02/15/2024) 30 tablet 0   naltrexone  (DEPADE) 50 MG tablet Take 1 tablet (50 mg total) by mouth daily. (Patient not taking: Reported on 02/15/2024) 30 tablet 0   nicotine  (NICODERM CQ  - DOSED IN MG/24 HOURS) 14 mg/24hr patch Place 1 patch (14 mg total) onto the skin daily. (Patient not taking: Reported on 02/15/2024) 30 patch 0    Labs  Lab Results:  Admission on 02/14/2024, Discharged on 02/15/2024  Component Date Value Ref Range Status   WBC 02/14/2024 8.0  4.0 - 10.5 K/uL Final   RBC 02/14/2024 4.57  4.22 - 5.81 MIL/uL Final   Hemoglobin 02/14/2024 14.3  13.0 - 17.0 g/dL Final   HCT 88/72/7974 42.3  39.0 - 52.0 % Final   MCV 02/14/2024 92.6  80.0 - 100.0 fL Final   MCH  02/14/2024 31.3  26.0 - 34.0 pg Final   MCHC 02/14/2024 33.8  30.0 - 36.0 g/dL Final   RDW 88/72/7974 13.7  11.5 - 15.5 % Final   Platelets 02/14/2024 450 (H)  150 - 400 K/uL Final   nRBC 02/14/2024 0.0  0.0 - 0.2 % Final   Neutrophils Relative % 02/14/2024 47  % Final   Neutro Abs 02/14/2024 3.7  1.7 - 7.7 K/uL Final   Lymphocytes Relative 02/14/2024 35  % Final   Lymphs Abs 02/14/2024 2.8  0.7 - 4.0 K/uL Final   Monocytes Relative 02/14/2024 13  % Final   Monocytes Absolute 02/14/2024 1.0  0.1 - 1.0 K/uL Final   Eosinophils Relative 02/14/2024 4  % Final   Eosinophils Absolute 02/14/2024 0.3  0.0 - 0.5 K/uL Final   Basophils Relative 02/14/2024 1  % Final   Basophils Absolute 02/14/2024 0.1  0.0 - 0.1 K/uL Final   Immature Granulocytes 02/14/2024 0  % Final   Abs Immature Granulocytes 02/14/2024 0.02  0.00 - 0.07 K/uL Final   Performed at [facility], [address] [place], [place] [address]   Sodium 02/14/2024 132 (L)  135 - 145 mmol/L Final   Potassium 02/14/2024 3.7  3.5 - 5.1 mmol/L Final   Chloride 02/14/2024 97 (L)  98 - 111 mmol/L Final   CO2 02/14/2024 24  22 - 32 mmol/L Final   Glucose, Bld 02/14/2024 102 (H)  70 - 99 mg/dL Final   Glucose reference range applies only to samples taken after fasting for at least 8 hours.   BUN 02/14/2024 6  6 - 20 mg/dL Final   Creatinine, Ser 02/14/2024 0.86  0.61 - 1.24 mg/dL Final   Calcium 88/72/7974 9.4  8.9 - 10.3 mg/dL Final   Total Protein 88/72/7974 8.8 (H)  6.5 - 8.1 g/dL Final   Albumin 88/72/7974 3.5  3.5 - 5.0 g/dL Final   AST 88/72/7974 66 (H)  15 - 41 U/L Final   ALT 02/14/2024 83 (H)  0 - 44 U/L Final   Alkaline Phosphatase 02/14/2024 67  38 - 126 U/L Final   Total Bilirubin 02/14/2024 1.1  0.0 - 1.2 mg/dL Final   GFR, Estimated 02/14/2024 >60  >60 mL/min Final   Comment: (NOTE) Calculated using the CKD-EPI Creatinine Equation (2021)    Anion gap 02/14/2024 11  5 - 15 Final   Performed at [facility], [address] [place], [place] [address]   Lipase 02/14/2024 27  11 - 51 U/L Final   Performed at [facility], [address], [place], [place] [address]   Magnesium  02/14/2024 1.9  1.7 - 2.4 mg/dL Final   Performed at [facility], [address] [place], [place] [address]   SARS Coronavirus 2 by RT PCR 02/15/2024 NEGATIVE  NEGATIVE Final   Influenza A by PCR 02/15/2024 NEGATIVE  NEGATIVE Final   Influenza B by PCR 02/15/2024 NEGATIVE  NEGATIVE Final   Comment: (NOTE) The Xpert Xpress SARS-CoV-2/FLU/RSV plus assay is intended as an aid in the diagnosis of influenza from Nasopharyngeal swab specimens and should not be used as a sole basis for treatment. Nasal washings and aspirates are unacceptable for Xpert Xpress SARS-CoV-2/FLU/RSV testing.  Fact Sheet for Patients: bloggercourse.com  Fact Sheet for Healthcare Providers: seriousbroker.it  This test is not yet approved or cleared by the [place]  FDA and has been authorized for detection and/or diagnosis of SARS-CoV-2 by FDA under an Emergency Use Authorization (EUA). This EUA will remain in effect (meaning this test can be used) for the duration of the COVID-19 declaration under Section 564(b)(1) of the Act, 21 U.S.C. section 360bbb-3(b)(1), unless the authorization is terminated or revoked.     Resp Syncytial Virus by PCR 02/15/2024 NEGATIVE  NEGATIVE Final   Comment: (NOTE) Fact Sheet for Patients: bloggercourse.com  Fact Sheet for Healthcare Providers: seriousbroker.it  This test is not yet approved or cleared by the [place]  FDA and has been authorized for detection and/or diagnosis of SARS-CoV-2 by FDA under an Emergency Use Authorization (EUA). This EUA will remain in effect (meaning this test  can be used) for the duration of the COVID-19 declaration under Section 564(b)(1) of the Act, 21  U.S.C. section 360bbb-3(b)(1), unless the authorization is terminated or revoked.  Performed at [facility], [address], [place], [place] [address]    Opiates 02/15/2024 NONE DETECTED  NONE DETECTED Final   Cocaine 02/15/2024 POSITIVE (A)  NONE DETECTED Final   Benzodiazepines 02/15/2024 POSITIVE (A)  NONE DETECTED Final   Amphetamines 02/15/2024 NONE DETECTED  NONE DETECTED Final   Tetrahydrocannabinol 02/15/2024 POSITIVE (A)  NONE DETECTED Final   Barbiturates 02/15/2024 NONE DETECTED  NONE DETECTED Final   Comment: (NOTE) DRUG SCREEN FOR MEDICAL PURPOSES ONLY.  IF CONFIRMATION IS NEEDED FOR ANY PURPOSE, NOTIFY LAB WITHIN 5 DAYS.  LOWEST DETECTABLE LIMITS FOR URINE DRUG SCREEN Drug Class                     Cutoff (ng/mL) Amphetamine and metabolites    1000 Barbiturate and metabolites    200 Benzodiazepine                 200 Opiates and metabolites        300 Cocaine and metabolites        300 THC                            50 Performed at [facility], [address] [place], [place] [address]    Color, Urine 02/15/2024 YELLOW  YELLOW Final   APPearance 02/15/2024 CLEAR  CLEAR Final   Specific Gravity, Urine 02/15/2024 1.008  1.005 - 1.030 Final   pH 02/15/2024 7.0  5.0 - 8.0 Final   Glucose, UA 02/15/2024 NEGATIVE  NEGATIVE mg/dL Final   Hgb urine dipstick 02/15/2024 NEGATIVE  NEGATIVE Final   Bilirubin Urine 02/15/2024 NEGATIVE  NEGATIVE Final   Ketones, ur 02/15/2024 NEGATIVE  NEGATIVE mg/dL Final   Protein, ur 88/71/7974 NEGATIVE  NEGATIVE mg/dL Final   Nitrite 88/71/7974 NEGATIVE  NEGATIVE Final   Leukocytes,Ua 02/15/2024 NEGATIVE  NEGATIVE Final   Performed at [facility], [address], [place], [place] [address]  Admission on 02/03/2024, Discharged on 02/08/2024  Component Date Value Ref Range Status   Folate 02/05/2024 12.5  >5.9 ng/mL Final   Comment: HEMOLYSIS AT THIS LEVEL MAY AFFECT RESULT Performed at [facility], [address], [place], [place] [address]    Hgb A1c MFr Bld 02/05/2024 5.1  4.8 - 5.6 % Final   Comment: (NOTE) Diagnosis of Diabetes The following HbA1c ranges recommended by the American Diabetes Association (ADA) may be used as an aid in the diagnosis of diabetes mellitus.  Hemoglobin             Suggested A1C NGSP%              Diagnosis  <5.7                   Non Diabetic  5.7-6.4                Pre-Diabetic  >6.4                   Diabetic  <7.0                   Glycemic control for  adults with diabetes.     Mean Plasma Glucose 02/05/2024 99.67  mg/dL Final   Performed at [facility], [address], [place], [place] [address]   Cholesterol 02/05/2024 154  0 - 200 mg/dL Final   Comment:        ATP III CLASSIFICATION:  <200     mg/dL   Desirable  799-760  mg/dL   Borderline High  >=759    mg/dL   High           Triglycerides 02/05/2024 220 (H)  <150 mg/dL Final   HDL 88/81/7974 30 (L)  >40 mg/dL Final   Total CHOL/HDL Ratio 02/05/2024 5.1  RATIO Final   VLDL 02/05/2024 44 (H)  0 - 40 mg/dL Final   LDL Cholesterol 02/05/2024 80  0 - 99 mg/dL Final   Comment:        Total Cholesterol/HDL:CHD Risk Coronary Heart Disease Risk Table                     Men   Women  1/2 Average Risk   3.4   3.3  Average Risk       5.0   4.4  2 X Average Risk   9.6   7.1  3 X Average Risk  23.4   11.0        Use the calculated Patient Ratio above and the CHD Risk Table to determine the patient's CHD Risk.        ATP III CLASSIFICATION (LDL):  <100     mg/dL   Optimal  899-870  mg/dL   Near or Above                    Optimal  130-159  mg/dL   Borderline  839-810  mg/dL   High  >809     mg/dL   Very High Performed at [facility], [address], [place], [place] [address]    TSH 02/05/2024 1.110  0.350 - 4.500 uIU/mL Final   Performed at [facility], [address] [place], [place] [address]    Vitamin B-12 02/05/2024 517  180 - 914 pg/mL Final   Performed at [facility], [address], [place], [place] [address]   Vit D, 25-Hydroxy 02/05/2024 28.74 (L)  30 - 100 ng/mL Final   Comment: (NOTE) Vitamin D  deficiency has been defined by the Institute of Medicine  and an Endocrine Society practice guideline as a level of serum 25-OH  vitamin D  less than 20 ng/mL (1,2). The Endocrine Society went on to  further define vitamin D  insufficiency as a level between 21 and 29  ng/mL (2).  1. IOM (Institute of Medicine). 2010. Dietary reference intakes for  calcium and D. [place]: [employer]. 2. Holick MF, Binkley Burnett, Bischoff-Ferrari HA, et al. Evaluation,  treatment, and prevention of vitamin D  deficiency: an Endocrine  Society clinical practice guideline, JCEM. 2011 Jul; 96(7): 1911-30.  Performed at [facility], [address], [place], [place] [address]   Admission on 02/02/2024, Discharged on 02/03/2024  Component Date Value Ref Range Status   Sodium 02/03/2024 135  135 - 145 mmol/L Final   Potassium 02/03/2024 4.0  3.5 - 5.1 mmol/L Final   Chloride 02/03/2024 102  98 - 111 mmol/L Final   CO2 02/03/2024 23  22 - 32 mmol/L Final   Glucose, Bld 02/03/2024 110 (H)  70 - 99 mg/dL Final   Glucose reference range applies only to samples taken after fasting for at least 8 hours.   BUN 02/03/2024 6  6 - 20 mg/dL Final   Creatinine, Ser 02/03/2024 0.73  0.61 - 1.24 mg/dL Final   Calcium 88/83/7974 9.0  8.9 - 10.3 mg/dL Final   Total Protein 88/83/7974 7.5  6.5 - 8.1 g/dL Final   Albumin 88/83/7974 3.0 (L)  3.5 - 5.0 g/dL Final   AST 88/83/7974 72 (H)  15 - 41 U/L Final   ALT 02/03/2024 87 (H)  0 - 44 U/L Final   Alkaline Phosphatase 02/03/2024 60  38 - 126 U/L Final   Total Bilirubin 02/03/2024 1.0  0.0 - 1.2 mg/dL Final   GFR, Estimated 02/03/2024 >60  >60 mL/min Final   Comment: (NOTE) Calculated using the CKD-EPI Creatinine  Equation (2021)    Anion gap 02/03/2024 10  5 - 15 Final   Performed at [facility], [address], [place], [place] [address]   WBC 02/03/2024 7.1  4.0 - 10.5 K/uL Final   RBC 02/03/2024 4.23  4.22 - 5.81 MIL/uL Final   Hemoglobin 02/03/2024 13.2  13.0 - 17.0 g/dL Final   HCT 88/83/7974 39.6  39.0 - 52.0 % Final   MCV 02/03/2024 93.6  80.0 - 100.0 fL Final   MCH 02/03/2024 31.2  26.0 - 34.0 pg Final   MCHC 02/03/2024 33.3  30.0 - 36.0 g/dL Final   RDW 88/83/7974 14.4  11.5 - 15.5 % Final   Platelets 02/03/2024 408 (H)  150 - 400 K/uL Final   nRBC 02/03/2024 0.0  0.0 - 0.2 % Final   Neutrophils Relative % 02/03/2024 57  % Final   Neutro Abs 02/03/2024 4.1  1.7 - 7.7 K/uL Final   Lymphocytes Relative 02/03/2024 22  % Final   Lymphs Abs 02/03/2024 1.5  0.7 - 4.0 K/uL Final   Monocytes Relative 02/03/2024 15  % Final   Monocytes Absolute 02/03/2024 1.1 (H)  0.1 - 1.0 K/uL Final   Eosinophils Relative 02/03/2024 5  % Final   Eosinophils Absolute 02/03/2024 0.3  0.0 - 0.5 K/uL Final   Basophils Relative 02/03/2024 1  % Final   Basophils Absolute 02/03/2024 0.1  0.0 - 0.1 K/uL Final   Immature Granulocytes 02/03/2024 0  % Final   Abs Immature Granulocytes 02/03/2024 0.02  0.00 - 0.07 K/uL Final   Performed at [facility], [address], [place], [place] [address]   Alcohol, Ethyl (B) 02/03/2024 <15  <15 mg/dL Final   Comment: (NOTE) For medical purposes only. Performed at [facility], [address] [place], [place] [address]    Color, Urine 02/02/2024 AMBER (A)  YELLOW Final   BIOCHEMICALS MAY BE AFFECTED BY COLOR   APPearance 02/02/2024 CLEAR  CLEAR Final   Specific Gravity, Urine 02/02/2024 1.024  1.005 - 1.030 Final   pH 02/02/2024 6.0  5.0 - 8.0 Final   Glucose, UA 02/02/2024 NEGATIVE  NEGATIVE mg/dL Final   Hgb urine dipstick 02/02/2024 NEGATIVE  NEGATIVE Final   Bilirubin Urine 02/02/2024 NEGATIVE  NEGATIVE Final   Ketones, ur 02/02/2024 NEGATIVE   NEGATIVE mg/dL Final   Protein, ur 88/84/7974 100 (A)  NEGATIVE mg/dL Final   Nitrite 88/84/7974 NEGATIVE  NEGATIVE Final   Leukocytes,Ua 02/02/2024 NEGATIVE  NEGATIVE Final   RBC / HPF 02/02/2024 0-5  0 - 5 RBC/hpf Final   WBC, UA 02/02/2024 0-5  0 - 5 WBC/hpf Final   Bacteria, UA 02/02/2024 RARE (A)  NONE SEEN Final   Squamous Epithelial / HPF 02/02/2024 0-5  0 - 5 /HPF Final   Mucus 02/02/2024 PRESENT   Final   Performed at [facility], [address] [place], [place] [address]   POC Amphetamine UR 02/02/2024 Positive (A)  NONE DETECTED (Cut Off Level 1000 ng/mL) Final   POC Secobarbital (BAR) 02/02/2024 None Detected  NONE DETECTED (Cut Off Level 300 ng/mL) Final   POC Buprenorphine (BUP) 02/02/2024 None Detected  NONE DETECTED (Cut Off Level 10 ng/mL) Final   POC Oxazepam (BZO) 02/02/2024 Positive (A)  NONE DETECTED (Cut Off Level 300 ng/mL) Final   POC Cocaine UR 02/02/2024 Positive (A)  NONE DETECTED (Cut Off Level 300 ng/mL) Final   POC Methamphetamine UR 02/02/2024 Positive (A)  NONE DETECTED (Cut Off Level 1000 ng/mL) Final   POC Morphine 02/02/2024 None Detected  NONE DETECTED (Cut Off Level 300 ng/mL) Final   POC Methadone UR 02/02/2024 None Detected  NONE DETECTED (Cut Off Level 300 ng/mL) Final   POC Oxycodone  UR 02/02/2024 None Detected  NONE DETECTED (Cut Off Level 100 ng/mL) Final   POC Marijuana UR 02/02/2024 Positive (A)  NONE DETECTED (Cut Off Level 50 ng/mL) Final  Admission on 01/13/2024, Discharged on 01/14/2024  Component Date Value Ref Range Status   Sodium 01/13/2024 137  135 - 145 mmol/L Final   Potassium 01/13/2024 3.4 (L)  3.5 - 5.1 mmol/L Final   Chloride 01/13/2024 100  98 - 111 mmol/L Final   CO2 01/13/2024 23  22 - 32 mmol/L Final   Glucose, Bld 01/13/2024 90  70 - 99 mg/dL Final   Glucose reference range applies only to samples taken after fasting for at least 8 hours.   BUN 01/13/2024 9  6 - 20 mg/dL Final   Creatinine, Ser 01/13/2024 0.80  0.61  - 1.24 mg/dL Final   Calcium 89/73/7974 8.6 (L)  8.9 - 10.3 mg/dL Final   Total Protein 89/73/7974 7.8  6.5 - 8.1 g/dL Final   Albumin 89/73/7974 3.3 (L)  3.5 - 5.0 g/dL Final   AST 89/73/7974 52 (H)  15 - 41 U/L Final   ALT 01/13/2024 83 (H)  0 - 44 U/L Final   Alkaline Phosphatase 01/13/2024 55  38 - 126 U/L Final   Total Bilirubin 01/13/2024 0.7  0.0 - 1.2 mg/dL Final   GFR, Estimated 01/13/2024 >60  >60 mL/min Final   Comment: (NOTE) Calculated using the CKD-EPI Creatinine Equation (2021)    Anion gap 01/13/2024 14  5 - 15 Final   Performed at [facility], [address], [place], [place] [address]   Alcohol, Ethyl (B) 01/13/2024 <15  <15 mg/dL Final   Comment: (NOTE) For medical purposes only. Performed at [facility], [address] [place], [place] [address]    WBC 01/13/2024 7.1  4.0 - 10.5 K/uL Final   RBC 01/13/2024 4.05 (L)  4.22 - 5.81 MIL/uL Final   Hemoglobin 01/13/2024 12.7 (L)  13.0 - 17.0 g/dL Final   HCT 89/73/7974 38.0 (L)  39.0 - 52.0 % Final   MCV 01/13/2024 93.8  80.0 - 100.0 fL Final   MCH 01/13/2024 31.4  26.0 - 34.0 pg Final   MCHC 01/13/2024 33.4  30.0 - 36.0 g/dL Final   RDW 89/73/7974 14.1  11.5 - 15.5 % Final   Platelets 01/13/2024 426 (H)  150 - 400  K/uL Final   nRBC 01/13/2024 0.0  0.0 - 0.2 % Final   Performed at [facility], [address] [place], [place] [address]   SARS Coronavirus 2 by RT PCR 01/13/2024 NEGATIVE  NEGATIVE Final   Influenza A by PCR 01/13/2024 NEGATIVE  NEGATIVE Final   Influenza B by PCR 01/13/2024 NEGATIVE  NEGATIVE Final   Comment: (NOTE) The Xpert Xpress SARS-CoV-2/FLU/RSV plus assay is intended as an aid in the diagnosis of influenza from Nasopharyngeal swab specimens and should not be used as a sole basis for treatment. Nasal washings and aspirates are unacceptable for Xpert Xpress SARS-CoV-2/FLU/RSV testing.  Fact Sheet for Patients: bloggercourse.com  Fact Sheet for  Healthcare Providers: seriousbroker.it  This test is not yet approved or cleared by the [place]  FDA and has been authorized for detection and/or diagnosis of SARS-CoV-2 by FDA under an Emergency Use Authorization (EUA). This EUA will remain in effect (meaning this test can be used) for the duration of the COVID-19 declaration under Section 564(b)(1) of the Act, 21 U.S.C. section 360bbb-3(b)(1), unless the authorization is terminated or revoked.     Resp Syncytial Virus by PCR 01/13/2024 NEGATIVE  NEGATIVE Final   Comment: (NOTE) Fact Sheet for Patients: bloggercourse.com  Fact Sheet for Healthcare Providers: seriousbroker.it  This test is not yet approved or cleared by the [place]  FDA and has been authorized for detection and/or diagnosis of SARS-CoV-2 by FDA under an Emergency Use Authorization (EUA). This EUA will remain in effect (meaning this test can be used) for the duration of the COVID-19 declaration under Section 564(b)(1) of the Act, 21 U.S.C. section 360bbb-3(b)(1), unless the authorization is terminated or revoked.  Performed at [facility], [address] [place], [place] [address]    Group A Strep by PCR 01/13/2024 NOT DETECTED  NOT DETECTED Final   Performed at [facility], [address], [place], [place] [address]   Opiates 01/14/2024 NONE DETECTED  NONE DETECTED Final   Cocaine 01/14/2024 POSITIVE (A)  NONE DETECTED Final   Benzodiazepines 01/14/2024 POSITIVE (A)  NONE DETECTED Final   Amphetamines 01/14/2024 POSITIVE (A)  NONE DETECTED Final   Comment: (NOTE) Trazodone  is metabolized in vivo to several metabolites, including pharmacologically active m-CPP, which is excreted in the urine. Immunoassay screens for amphetamines and MDMA have potential cross-reactivity with these compounds and may provide false positive  results.     Tetrahydrocannabinol  01/14/2024 POSITIVE (A)  NONE DETECTED Final   Barbiturates 01/14/2024 NONE DETECTED  NONE DETECTED Final   Comment: (NOTE) DRUG SCREEN FOR MEDICAL PURPOSES ONLY.  IF CONFIRMATION IS NEEDED FOR ANY PURPOSE, NOTIFY LAB WITHIN 5 DAYS.  LOWEST DETECTABLE LIMITS FOR URINE DRUG SCREEN Drug Class                     Cutoff (ng/mL) Amphetamine and metabolites    1000 Barbiturate and metabolites    200 Benzodiazepine                 200 Opiates and metabolites        300 Cocaine and metabolites        300 THC                            50 Performed at [facility], [address], [place], [place] [address]   Admission on 01/07/2024, Discharged on 01/07/2024  Component Date Value  Ref Range Status   Sodium 01/07/2024 133 (L)  135 - 145 mmol/L Final   Potassium 01/07/2024 3.6  3.5 - 5.1 mmol/L Final   Chloride 01/07/2024 98  98 - 111 mmol/L Final   CO2 01/07/2024 24  22 - 32 mmol/L Final   Glucose, Bld 01/07/2024 106 (H)  70 - 99 mg/dL Final   Glucose reference range applies only to samples taken after fasting for at least 8 hours.   BUN 01/07/2024 6  6 - 20 mg/dL Final   Creatinine, Ser 01/07/2024 0.80  0.61 - 1.24 mg/dL Final   Calcium 89/79/7974 9.2  8.9 - 10.3 mg/dL Final   Total Protein 89/79/7974 7.9  6.5 - 8.1 g/dL Final   Albumin 89/79/7974 3.9  3.5 - 5.0 g/dL Final   AST 89/79/7974 106 (H)  15 - 41 U/L Final   ALT 01/07/2024 131 (H)  0 - 44 U/L Final   Alkaline Phosphatase 01/07/2024 78  38 - 126 U/L Final   Total Bilirubin 01/07/2024 0.8  0.0 - 1.2 mg/dL Final   GFR, Estimated 01/07/2024 >60  >60 mL/min Final   Comment: (NOTE) Calculated using the CKD-EPI Creatinine Equation (2021)    Anion gap 01/07/2024 11  5 - 15 Final   Performed at [facility], [address], [place], [place] [address]   Alcohol, Ethyl (B) 01/07/2024 <15  <15 mg/dL Final   Comment: (NOTE) For medical purposes only. Performed at [facility], [address] [place], [place] [address]    Opiates 01/07/2024 NEGATIVE  NEGATIVE Final   Cocaine 01/07/2024 POSITIVE (A)  NEGATIVE Final   Benzodiazepines 01/07/2024 NEGATIVE  NEGATIVE Final   Amphetamines 01/07/2024 POSITIVE (A)  NEGATIVE Final   Tetrahydrocannabinol 01/07/2024 NEGATIVE  NEGATIVE Final   Barbiturates 01/07/2024 NEGATIVE  NEGATIVE Final   Methadone Scn, Ur 01/07/2024 NEGATIVE  NEGATIVE Final   Fentanyl  01/07/2024 NEGATIVE  NEGATIVE Final   Comment: (NOTE) Drug screen is for Medical Purposes only. Positive results are preliminary only. If confirmation is needed, notify lab within 5 days.  Drug Class                 Cutoff (ng/mL) Amphetamine and metabolites 1000 Barbiturate and metabolites 200 Benzodiazepine              200 Opiates and metabolites     300 Cocaine and metabolites     300 THC                         50 Fentanyl                     5 Methadone                   300  Trazodone  is metabolized in vivo to several metabolites,  including pharmacologically active m-CPP, which is excreted in the  urine.  Immunoassay screens for amphetamines and MDMA have potential  cross-reactivity with these compounds and may provide false positive  result.  Performed at [facility], [address], [place], [place] [address]    WBC 01/07/2024 9.5  4.0 - 10.5 K/uL Final   RBC 01/07/2024 4.30  4.22 - 5.81 MIL/uL Final   Hemoglobin 01/07/2024 13.0  13.0 - 17.0 g/dL Final   HCT 89/79/7974 39.7  39.0 - 52.0 % Final   MCV 01/07/2024 92.3  80.0 - 100.0 fL Final   MCH  01/07/2024 30.2  26.0 - 34.0 pg Final   MCHC 01/07/2024 32.7  30.0 - 36.0 g/dL Final   RDW 89/79/7974 13.2  11.5 - 15.5 % Final   Platelets 01/07/2024 385  150 - 400 K/uL Final   nRBC 01/07/2024 0.0  0.0 - 0.2 % Final   Neutrophils Relative % 01/07/2024 70  % Final   Neutro Abs 01/07/2024 6.5  1.7 - 7.7 K/uL Final   Lymphocytes Relative 01/07/2024 17  % Final   Lymphs Abs 01/07/2024 1.6   0.7 - 4.0 K/uL Final   Monocytes Relative 01/07/2024 11  % Final   Monocytes Absolute 01/07/2024 1.0  0.1 - 1.0 K/uL Final   Eosinophils Relative 01/07/2024 2  % Final   Eosinophils Absolute 01/07/2024 0.2  0.0 - 0.5 K/uL Final   Basophils Relative 01/07/2024 0  % Final   Basophils Absolute 01/07/2024 0.0  0.0 - 0.1 K/uL Final   Immature Granulocytes 01/07/2024 0  % Final   Abs Immature Granulocytes 01/07/2024 0.04  0.00 - 0.07 K/uL Final   Performed at [facility], [address], [place], [place] [address]  Admission on 01/06/2024, Discharged on 01/07/2024  Component Date Value Ref Range Status   Group A Strep by PCR 01/07/2024 NOT DETECTED  NOT DETECTED Final   Performed at [facility], [address] [place], [place] [address]   SARS Coronavirus 2 by RT PCR 01/07/2024 NEGATIVE  NEGATIVE Final   Influenza A by PCR 01/07/2024 NEGATIVE  NEGATIVE Final   Influenza B by PCR 01/07/2024 NEGATIVE  NEGATIVE Final   Comment: (NOTE) The Xpert Xpress SARS-CoV-2/FLU/RSV plus assay is intended as an aid in the diagnosis of influenza from Nasopharyngeal swab specimens and should not be used as a sole basis for treatment. Nasal washings and aspirates are unacceptable for Xpert Xpress SARS-CoV-2/FLU/RSV testing.  Fact Sheet for Patients: bloggercourse.com  Fact Sheet for Healthcare Providers: seriousbroker.it  This test is not yet approved or cleared by the [place]  FDA and has been authorized for detection and/or diagnosis of SARS-CoV-2 by FDA under an Emergency Use Authorization (EUA). This EUA will remain in effect (meaning this test can be used) for the duration of the COVID-19 declaration under Section 564(b)(1) of the Act, 21 U.S.C. section 360bbb-3(b)(1), unless the authorization is terminated or revoked.     Resp Syncytial Virus by PCR 01/07/2024 NEGATIVE  NEGATIVE Final   Comment: (NOTE) Fact Sheet  for Patients: bloggercourse.com  Fact Sheet for Healthcare Providers: seriousbroker.it  This test is not yet approved or cleared by the [place]  FDA and has been authorized for detection and/or diagnosis of SARS-CoV-2 by FDA under an Emergency Use Authorization (EUA). This EUA will remain in effect (meaning this test can be used) for the duration of the COVID-19 declaration under Section 564(b)(1) of the Act, 21 U.S.C. section 360bbb-3(b)(1), unless the authorization is terminated or revoked.  Performed at [facility], [address] [place], [place] [address]   Admission on 01/06/2024, Discharged on 01/06/2024  Component Date Value Ref Range Status   Sodium 01/06/2024 135  135 - 145 mmol/L Final   Potassium 01/06/2024 3.3 (L)  3.5 - 5.1 mmol/L Final   Chloride 01/06/2024 99  98 - 111 mmol/L Final   CO2 01/06/2024 22  22 - 32 mmol/L Final   Glucose, Bld 01/06/2024 104 (H)  70 - 99 mg/dL Final   Glucose reference range applies only to samples taken after fasting for  at least 8 hours.   BUN 01/06/2024 10  6 - 20 mg/dL Final   Creatinine, Ser 01/06/2024 0.85  0.61 - 1.24 mg/dL Final   Calcium 89/80/7974 9.3  8.9 - 10.3 mg/dL Final   Total Protein 89/80/7974 8.5 (H)  6.5 - 8.1 g/dL Final   Albumin 89/80/7974 4.2  3.5 - 5.0 g/dL Final   AST 89/80/7974 107 (H)  15 - 41 U/L Final   ALT 01/06/2024 132 (H)  0 - 44 U/L Final   Alkaline Phosphatase 01/06/2024 77  38 - 126 U/L Final   Total Bilirubin 01/06/2024 0.7  0.0 - 1.2 mg/dL Final   GFR, Estimated 01/06/2024 >60  >60 mL/min Final   Comment: (NOTE) Calculated using the CKD-EPI Creatinine Equation (2021)    Anion gap 01/06/2024 14  5 - 15 Final   Performed at [facility], [address], [place], [place] [address]   Alcohol, Ethyl (B) 01/06/2024 <15  <15 mg/dL Final   Comment: (NOTE) For medical purposes only. Performed at [facility], [address], [place], [place] [address]    WBC 01/06/2024 7.8  4.0 - 10.5 K/uL Final   RBC 01/06/2024 4.25  4.22 - 5.81 MIL/uL Final   Hemoglobin 01/06/2024 13.0  13.0 - 17.0 g/dL Final   HCT 89/80/7974 38.8 (L)  39.0 - 52.0 % Final   MCV 01/06/2024 91.3  80.0 - 100.0 fL Final   MCH 01/06/2024 30.6  26.0 - 34.0 pg Final   MCHC 01/06/2024 33.5  30.0 - 36.0 g/dL Final   RDW 89/80/7974 13.2  11.5 - 15.5 % Final   Platelets 01/06/2024 404 (H)  150 - 400 K/uL Final   nRBC 01/06/2024 0.0  0.0 - 0.2 % Final   Performed at [facility], [address] [place], [place] [address]   Opiates 01/06/2024 NEGATIVE  NEGATIVE Final   Cocaine 01/06/2024 POSITIVE (A)  NEGATIVE Final   Benzodiazepines 01/06/2024 POSITIVE (A)  NEGATIVE Final   Amphetamines 01/06/2024 POSITIVE (A)  NEGATIVE Final   Tetrahydrocannabinol 01/06/2024 POSITIVE (A)  NEGATIVE Final   Barbiturates 01/06/2024 NEGATIVE  NEGATIVE Final   Methadone Scn, Ur 01/06/2024 NEGATIVE  NEGATIVE Final   Fentanyl  01/06/2024 NEGATIVE  NEGATIVE Final   Comment: (NOTE) Drug screen is for Medical Purposes only. Positive results are preliminary only. If confirmation is needed, notify lab within 5 days.  Drug Class                 Cutoff (ng/mL) Amphetamine and metabolites 1000 Barbiturate and metabolites 200 Benzodiazepine              200 Opiates and metabolites     300 Cocaine and metabolites     300 THC                         50 Fentanyl                     5 Methadone                   300  Trazodone  is metabolized in vivo to several metabolites,  including pharmacologically active m-CPP, which is excreted in the  urine.  Immunoassay screens for amphetamines and MDMA have potential  cross-reactivity with these compounds and may provide false positive  result.  Performed at [facility], [address], [place], [place] [address]  Acetaminophen  (Tylenol ), Serum 01/06/2024 <10  (L)  10 - 30 ug/mL Final   Comment: (NOTE) Toxic concentrations can be more effectively related to post dose interval; > 200, > 100, and > 50 ug/mL serum concentrations correspond to toxic concentrations at 4, 8, and 12 hours post dose, respectively.  Performed at [facility], [address] [place], [place] [address]    Salicylate Lvl 01/06/2024 <7.0 (L)  7.0 - 30.0 mg/dL Final   Performed at [facility], [address], [place], [place] [address]   Color, Urine 01/06/2024 AMBER (A)  YELLOW Final   BIOCHEMICALS MAY BE AFFECTED BY COLOR   APPearance 01/06/2024 HAZY (A)  CLEAR Final   Specific Gravity, Urine 01/06/2024 1.026  1.005 - 1.030 Final   pH 01/06/2024 5.0  5.0 - 8.0 Final   Glucose, UA 01/06/2024 NEGATIVE  NEGATIVE mg/dL Final   Hgb urine dipstick 01/06/2024 SMALL (A)  NEGATIVE Final   Bilirubin Urine 01/06/2024 NEGATIVE  NEGATIVE Final   Ketones, ur 01/06/2024 5 (A)  NEGATIVE mg/dL Final   Protein, ur 89/80/7974 100 (A)  NEGATIVE mg/dL Final   Nitrite 89/80/7974 NEGATIVE  NEGATIVE Final   Leukocytes,Ua 01/06/2024 NEGATIVE  NEGATIVE Final   RBC / HPF 01/06/2024 11-20  0 - 5 RBC/hpf Final   WBC, UA 01/06/2024 11-20  0 - 5 WBC/hpf Final   Bacteria, UA 01/06/2024 RARE (A)  NONE SEEN Final   Squamous Epithelial / HPF 01/06/2024 0-5  0 - 5 /HPF Final   Mucus 01/06/2024 PRESENT   Final   Hyaline Casts, UA 01/06/2024 PRESENT   Final   Performed at [facility], [address], [place], [place] [address]  Admission on 01/04/2024, Discharged on 01/05/2024  Component Date Value Ref Range Status   RPR Ser Ql 01/04/2024 NON REACTIVE  NON REACTIVE Final   Performed at [facility], [address], [place], [place] [address]   HIV Screen 4th Generation wRfx 01/04/2024 Non Reactive  Non Reactive Final   Performed at [facility], [address], [place], [place] [address]   Neisseria Gonorrhea 01/05/2024 Negative    Final   Chlamydia 01/05/2024 Negative   Final   Comment 01/05/2024 Normal Reference Ranger Chlamydia - Negative   Final   Comment 01/05/2024 Normal Reference Range Neisseria Gonorrhea - Negative   Final  Admission on 01/04/2024, Discharged on 01/04/2024  Component Date Value Ref Range Status   Sodium 01/04/2024 133 (L)  135 - 145 mmol/L Final   Potassium 01/04/2024 3.2 (L)  3.5 - 5.1 mmol/L Final   Chloride 01/04/2024 99  98 - 111 mmol/L Final   CO2 01/04/2024 21 (L)  22 - 32 mmol/L Final   Glucose, Bld 01/04/2024 117 (H)  70 - 99 mg/dL Final   Glucose reference range applies only to samples taken after fasting for at least 8 hours.   BUN 01/04/2024 5 (L)  6 - 20 mg/dL Final   Creatinine, Ser 01/04/2024 0.94  0.61 - 1.24 mg/dL Final   Calcium 89/82/7974 9.2  8.9 - 10.3 mg/dL Final   Total Protein 89/82/7974 9.1 (H)  6.5 - 8.1 g/dL Final   Albumin 89/82/7974 3.9  3.5 - 5.0 g/dL Final   AST 89/82/7974 85 (H)  15 - 41 U/L Final   ALT 01/04/2024 103 (H)  0 - 44 U/L Final   Alkaline Phosphatase 01/04/2024 77  38 - 126 U/L Final   Total Bilirubin 01/04/2024  1.5 (H)  0.0 - 1.2 mg/dL Final   GFR, Estimated 01/04/2024 >60  >60 mL/min Final   Comment: (NOTE) Calculated using the CKD-EPI Creatinine Equation (2021)    Anion gap 01/04/2024 13  5 - 15 Final   Performed at [facility], [address], [place], [place] [address]   Alcohol, Ethyl (B) 01/04/2024 <15  <15 mg/dL Final   Comment: (NOTE) For medical purposes only. Performed at [facility], [address], [place], [place] [address]    Opiates 01/04/2024 NONE DETECTED  NONE DETECTED Final   Cocaine 01/04/2024 POSITIVE (A)  NONE DETECTED Final   Benzodiazepines 01/04/2024 NONE DETECTED  NONE DETECTED Final   Amphetamines 01/04/2024 POSITIVE (A)  NONE DETECTED Final   Comment: (NOTE) Trazodone  is metabolized in vivo to several metabolites, including pharmacologically active m-CPP, which is excreted in the  urine. Immunoassay screens for amphetamines and MDMA have potential cross-reactivity with these compounds and may provide false positive  results.     Tetrahydrocannabinol 01/04/2024 NONE DETECTED  NONE DETECTED Final   Barbiturates 01/04/2024 NONE DETECTED  NONE DETECTED Final   Comment: (NOTE) DRUG SCREEN FOR MEDICAL PURPOSES ONLY.  IF CONFIRMATION IS NEEDED FOR ANY PURPOSE, NOTIFY LAB WITHIN 5 DAYS.  LOWEST DETECTABLE LIMITS FOR URINE DRUG SCREEN Drug Class                     Cutoff (ng/mL) Amphetamine and metabolites    1000 Barbiturate and metabolites    200 Benzodiazepine                 200 Opiates and metabolites        300 Cocaine and metabolites        300 THC                            50 Performed at [facility], [address], [place], [place] [address]    WBC 01/04/2024 9.8  4.0 - 10.5 K/uL Final   RBC 01/04/2024 4.64  4.22 - 5.81 MIL/uL Final   Hemoglobin 01/04/2024 14.4  13.0 - 17.0 g/dL Final   HCT 89/82/7974 41.9  39.0 - 52.0 % Final   MCV 01/04/2024 90.3  80.0 - 100.0 fL Final   MCH 01/04/2024 31.0  26.0 - 34.0 pg Final   MCHC 01/04/2024 34.4  30.0 - 36.0 g/dL Final   RDW 89/82/7974 12.8  11.5 - 15.5 % Final   Platelets 01/04/2024 422 (H)  150 - 400 K/uL Final   nRBC 01/04/2024 0.0  0.0 - 0.2 % Final   Neutrophils Relative % 01/04/2024 55  % Final   Neutro Abs 01/04/2024 5.5  1.7 - 7.7 K/uL Final   Lymphocytes Relative 01/04/2024 25  % Final   Lymphs Abs 01/04/2024 2.5  0.7 - 4.0 K/uL Final   Monocytes Relative 01/04/2024 16  % Final   Monocytes Absolute 01/04/2024 1.6 (H)  0.1 - 1.0 K/uL Final   Eosinophils Relative 01/04/2024 3  % Final   Eosinophils Absolute 01/04/2024 0.3  0.0 - 0.5 K/uL Final   Basophils Relative 01/04/2024 1  % Final   Basophils Absolute 01/04/2024 0.1  0.0 - 0.1 K/uL Final   Immature Granulocytes 01/04/2024 0  % Final   Abs Immature Granulocytes 01/04/2024 0.03  0.00 - 0.07 K/uL Final   Performed at [facility], [address] [place], [place]  [address]  Admission on 09/07/2023, Discharged on 09/11/2023  Component Date Value Ref Range Status   Sodium 09/09/2023 136  135 - 145 mmol/L Final   Potassium 09/09/2023 4.5  3.5 - 5.1 mmol/L Final   Chloride 09/09/2023 99  98 - 111 mmol/L Final   CO2 09/09/2023 28  22 - 32 mmol/L Final   Glucose, Bld 09/09/2023 84  70 - 99 mg/dL Final   Glucose reference range applies only to samples taken after fasting for at least 8 hours.   BUN 09/09/2023 11  6 - 20 mg/dL Final   Creatinine, Ser 09/09/2023 1.16  0.61 - 1.24 mg/dL Final   Calcium 93/77/7974 9.8  8.9 - 10.3 mg/dL Final   Total Protein 93/77/7974 7.9  6.5 - 8.1 g/dL Final   Albumin 93/77/7974 3.0 (L)  3.5 - 5.0 g/dL Final   AST 93/77/7974 60 (H)  15 - 41 U/L Final   ALT 09/09/2023 59 (H)  0 - 44 U/L Final   Alkaline Phosphatase 09/09/2023 56  38 - 126 U/L Final   Total Bilirubin 09/09/2023 0.6  0.0 - 1.2 mg/dL Final   GFR, Estimated 09/09/2023 >60  >60 mL/min Final   Comment: (NOTE) Calculated using the CKD-EPI Creatinine Equation (2021)    Anion gap 09/09/2023 9  5 - 15 Final   Performed at [facility], [address], [place], [place] [address]   Vitamin B-12 09/09/2023 206  180 - 914 pg/mL Final   Comment: (NOTE) This assay is not validated for testing neonatal or myeloproliferative syndrome specimens for Vitamin B12 levels. Performed at [facility], [address] [place], [place] [address]    Vitamin B6 09/09/2023 7.0  3.4 - 65.2 ug/L Final   Comment: (NOTE) This test was developed and its performance characteristics determined by [facility]. It has not been cleared or approved by the Food and Drug Administration.                             Deficiency:         <3.4                             Marginal:      3.4 - 5.1                             Adequate:           >5.1 Performed At: [facility] [address] [place], [place] [id number] Jennette Shorter MD  Ey:[id number]   There may be more visits with results that are not included.    Blood Alcohol level:  Lab Results  Component Value Date   [facility] <15 02/03/2024   ETH <15 01/13/2024    Metabolic Disorder Labs: Lab Results  Component Value Date   HGBA1C 5.1 02/05/2024   MPG 99.67 02/05/2024   MPG 91.06 07/19/2023   No results found for: PROLACTIN Lab Results  Component Value Date   CHOL 154 02/05/2024   TRIG 220 (H) 02/05/2024   HDL 30 (L) 02/05/2024   CHOLHDL 5.1 02/05/2024   VLDL 44 (H) 02/05/2024   LDLCALC 80 02/05/2024   LDLCALC 102 (H) 07/19/2023    Therapeutic Lab Levels: No results found for: LITHIUM No results found for: VALPROATE No results found for: CBMZ  Physical Findings  AIMS    Flowsheet Row Clinical Support from 05/19/2022 in [facility]Dis[facility]) [facility]) [facility]  AIMS Total Score 10 0 0   AUDIT    Flowsheet Row ED from 02/15/2024 in [facility] Admission (Discharged) from [facility] ED from 09/07/2023 in [facility] Admission (Discharged) from [facility] Ad[facility] (Discharged) from [facility]  Alcohol Use Disorder Identification Test Final Score (AUDIT) 32 35 24 31 0   GAD-7    Flowsheet Row Video Visit from 01/26/2022 in [facility] Video Visit from 11/01/2021 in [facility] Video Visit from 03/22/2021 in [facility] Office Visit from 12/21/2020 in [facility]  Total GAD-7 Score 17 18 20 19    [id number]    Flowsheet Row ED from 02/15/2024 in [facility] ED from 09/07/2023 in [facility] ED from 09/06/2023 in [facility] ED from 09/05/2023 in [facility] Office Visit from 11/29/2022 in [facility]  PHQ-2 Total Score 4 0 2 2 3   PHQ-9 Total Score 11 0 7 7 12    Flowsheet Row ED from 02/15/2024 in [facility] ED from 02/14/2024 in [facility] Emergency Department at [facility] ED from 02/11/2024 in [facility] Emergency Department at [facility]  C-SSRS RISK CATEGORY No Risk Moderate Risk No Risk     Musculoskeletal  Strength & Muscle Tone: within normal limits Gait & Station: normal Patient leans: N/A  Psychiatric Specialty Exam  Presentation  General Appearance:  Appropriate for Environment; Casual  Eye Contact: Good  Speech: Clear and Coherent; Normal Rate  Speech Volume: Normal  Handedness: Right   Mood and Affect  Mood: Anxious  Affect: Congruent   Thought Process  Thought Processes: Coherent; Goal Directed; Linear  Descriptions of Associations:Intact  Orientation:Full (Time, Place and Person)  Thought Content:Logical; WDL  Diagnosis of Schizophrenia or Schizoaffective disorder in past: No  Duration of Psychotic Symptoms: Greater than six months   Hallucinations:Hallucinations: Auditory Description of Auditory Hallucinations: I hear whispers, it usually happens in the early morning or late evening.  Ideas of Reference:None  Suicidal Thoughts:Suicidal Thoughts: No  Homicidal Thoughts:Homicidal Thoughts: No   Sensorium  Memory: Immediate Good; Recent Fair  Judgment: Fair  Insight: Fair   Art Therapist  Concentration: Good  Attention Span: Good  Recall: Good  Fund of Knowledge: Good  Language: Good   Psychomotor Activity  Psychomotor Activity: Psychomotor  Activity: Normal   Assets  Assets: Communication Skills; Desire for Improvement; Resilience   Sleep  Sleep: Sleep: Good  Estimated Sleeping Duration (Last 24 Hours): 11.75-13.25 hours  Nutritional Assessment (For OBS and FBC admissions only) Has the patient had a weight loss or gain of 10 pounds or more in the last 3 months?: No Has the patient had a decrease in food intake/or appetite?: No Does the patient have dental problems?: No Does the patient have eating habits or behaviors that may be indicators of an eating disorder including binging or inducing vomiting?: No Has the patient recently lost weight without trying?: 0 Has the  patient been eating poorly because of a decreased appetite?: 0 Malnutrition Screening Tool Score: 0    Physical Exam  Physical Exam Vitals and nursing note reviewed.  Constitutional:      Appearance: Normal appearance. He is well-developed.  HENT:     Head: Normocephalic and atraumatic.     Nose: Nose normal.  Cardiovascular:     Rate and Rhythm: Normal rate.  Pulmonary:     Effort: Pulmonary effort is normal.  Musculoskeletal:        General: Normal range of motion.     Cervical back: Normal range of motion.  Skin:    General: Skin is warm and dry.  Neurological:     Mental Status: He is alert and oriented to person, place, and time.  Psychiatric:        Attention and Perception: Attention and perception normal.        Mood and Affect: Affect normal. Mood is depressed.        Speech: Speech normal.        Behavior: Behavior normal. Behavior is cooperative.        Thought Content: Thought content normal.        Cognition and Memory: Cognition and memory normal.    Review of Systems  Constitutional: Negative.   HENT: Negative.    Eyes: Negative.   Respiratory: Negative.    Cardiovascular: Negative.   Gastrointestinal: Negative.   Genitourinary: Negative.   Musculoskeletal: Negative.   Skin: Negative.   Neurological: Negative.    Psychiatric/Behavioral:  Positive for depression and substance abuse.    Blood pressure 120/78, pulse 72, temperature 97.9 F (36.6 C), resp. rate 16, SpO2 96%. There is no height or weight on file to calculate BMI.  Treatment Plan Summary: Daily contact with patient to assess and evaluate symptoms and progress in treatment Continue with current treatment plan on 02/18/2024 as listed below, except where noted.  CSW team continues to seek residential treatment options/discharge disposition recommendations.  Patient under review with tentative acceptance to [facility] on 02/19/2024.   Alcohol use disorder: -Multivitamin with minerals 1 tablet daily -Thiamine  tablet 100 mg daily  -Acetaminophen  650 mg every 6 hours, as needed/mild pain -Maalox 30 mL oral every 4 hours, as needed/digestion -Hydroxyzine  25 mg 3 times daily as needed/anxiety -Magnesium  hydroxide 30 mL daily as needed/mild constipation -Trazodone  50 mg nightly, as needed/sleep -NicoDerm 21 mg transdermal patch daily/nicotine  withdrawal  CIWA less than 4 x 24 hours, discontinue CIWA protocol at this time.  Vitamin D  deficiency: -Colecalciferol (vitamin D3) 25 mcg (1000 unit) tablet 2000 units oral daily  3. Schizophrenia, unspecified type: -Haloperidol  5 mg every morning -Haloperidol  7.5 mg nightly   Agitation protocol: MILD -Haloperidol  5 mg 3 times daily as needed mild agitation  -Diphenhydramine  50 mg p.o. 3 times daily as needed mild agitation  MODERATE -Haloperidol  5 mg IM 3 times daily as needed/moderate agitation -Diphenhydramine  50 mg IM 3 times daily as needed/moderate agitation -Lorazepam  2 mg IM 3 times daily as needed/moderate agitation  SEVERE -Haloperidol  10 mg IM 3 times daily as needed severe agitation -Diphenhydramine  50 mg IM 3 times daily as needed/severe agitation -Lorazepam  2 mg IM 3 times daily as needed/severe agitation   Ellouise LITTIE Dawn, FNP 02/18/2024 3:08 PM

## 2024-02-18 NOTE — Progress Notes (Signed)
 Patient endorses increased anxiety. Hx includes GAD. Start hydroxyzine  25mg  TID, PRN/anxiety.

## 2024-02-18 NOTE — ED Notes (Signed)
 Pt resting quietly at present with interruptions of harmful thoughts or verbalization of SI.

## 2024-02-18 NOTE — Group Note (Signed)
 Group Topic: Relapse and Recovery  Group Date: 02/18/2024 Start Time: 1100 End Time: 1200 Facilitators: Fredderick Swanger, LCSW  Department: [facility]  Number of Participants: 6  Group Focus: chemical dependency issues and substance abuse education Treatment Modality:  Cognitive Behavioral Therapy Interventions utilized were patient education and problem solving Purpose: enhance coping skills, explore maladaptive thinking, express feelings, and relapse prevention strategies   Writer explored with the group triggers and coping skills by utilizing a CBT worksheet to explore triggers.  Triggers included environments, people, and etc.  Writer allowed all group members to share their thoughts and feelings.  Name: Jerry [name] Date of Birth: 1975-04-03  MR: [id number]    Level of Participation: active Quality of Participation: cooperative and engaged Interactions with others: gave feedback Mood/Affect: brightens with interaction Triggers (if applicable): Client reports a bad childhood and trauma since at least the age of 52. Cognition: fearful and goal directed Progress: Gaining insight Response:  actively engaged and open Plan: patient will be encouraged to attend residential treatment. Client reports he's ready to attend a long term program  Patients Problems:  Patient Active Problem List   Diagnosis Date Noted   Alcohol use disorder, severe, dependence (HCC) 02/15/2024   Cocaine use disorder (HCC) 02/15/2024   Mild benzodiazepine use disorder (HCC) 02/06/2024   Protein-calorie malnutrition, severe 07/03/2023   Left arm cellulitis 07/01/2023   Jaw pain 11/29/2022   Need for influenza vaccination 11/29/2022   Erectile dysfunction 11/29/2022   Hyponatremia 11/29/2022   History of hepatitis C virus infection 11/29/2022   Tardive dyskinesia 10/24/2022   Generalized anxiety disorder 10/24/2022   PTSD (post-traumatic stress disorder) 10/24/2022    Healthcare maintenance 10/24/2022   Schizophrenia (HCC) 07/07/2022   Malingering 09/07/2020   Homelessness 09/07/2020   Schizophrenia spectrum disorder with psychotic disorder type not yet determined (HCC) 07/17/2020   Methamphetamine abuse (HCC) 07/17/2020   Marijuana abuse 07/17/2020   Cocaine abuse (HCC) 07/17/2020   Anxiety and depression 06/25/2014   Family history of diabetes mellitus (DM) 06/25/2014   Family history of thyroid  disease 06/25/2014   Tobacco use disorder 06/25/2014   Alcohol abuse 05/18/2013   Polysubstance abuse (HCC) 05/18/2013   Depression, unspecified 05/18/2013

## 2024-02-18 NOTE — ED Notes (Addendum)
 Patient in dayroom, watching movie with peers,calm and cooperative,no complaints, no distress noted

## 2024-02-18 NOTE — ED Notes (Signed)
 Patient alert & oriented x4. Pt on unit throughout shift q15 checks completed. Pt had breakfast. Pt denies intent to harm self or others. Denies AVH. No signs of acute distress noted. No inappropriate behaviors observed or reported. Encouraged patient to notify staff if any thoughts of harm towards self or others arise. Patient verbalizes understanding and agreement. Eating and fluid intake were adequate. Medications administered as ordered; no adverse effects noted.

## 2024-02-18 NOTE — Care Management (Signed)
 [facility] Care Management.Scientist, Research (medical) met with the client and he reports wherever he's accepted he will need clothing and shoes.  Writer informed him most agencies will have a clothing bank with items once he arrives.  Writer also informed him she will connect him to the Peer Support who may also be able to assist.   Clinical Research Associate sent referrals and supporting documents to [facility], [facility], and [facility].

## 2024-02-18 NOTE — ED Notes (Signed)
 Pt resting quietly without complaints will continue to promote safety.

## 2024-02-18 NOTE — Discharge Instructions (Addendum)
 Patient is instructed prior to discharge to:  Take all medications as prescribed by his/her mental healthcare provider. Report any adverse effects and or reactions from the medicines to his/her outpatient provider promptly. Keep all scheduled appointments, to ensure that you are getting refills on time and to avoid any interruption in your medication.  If you are unable to keep an appointment call to reschedule.  Be sure to follow-up with resources and follow-up appointments provided.  Patient has been instructed & cautioned: To not engage in alcohol and or illegal drug use while on prescription medicines. In the event of worsening symptoms, patient is instructed to call the crisis hotline, 911 and or go to the nearest ED for appropriate evaluation and treatment of symptoms. To follow-up with his/her primary care provider for your other medical issues, concerns and or health care needs.  Information: -National Suicide Prevention Lifeline 1-800-SUICIDE or 978-783-9948.  -988 offers 24/7 access to trained crisis counselors who can help people experiencing mental health-related distress. People can call or text 988 or chat 988lifeline.org for themselves or if they are worried about a loved one who may need crisis support.       [facility] Care Management...   [facility] Care Management...  Patient requested inpatient treatment  Per Debbie at [facility]  Patient has been accepted to their program.   Patient can discharge Tuesday 02/19/2024 to...  [facility] [address]  [place], [place] [address]  [facility] will arrange transportation  Printed prescriptions provided.

## 2024-02-19 ENCOUNTER — Other Ambulatory Visit (HOSPITAL_COMMUNITY): Payer: Self-pay

## 2024-02-19 DIAGNOSIS — F1220 Cannabis dependence, uncomplicated: Secondary | ICD-10-CM | POA: Diagnosis not present

## 2024-02-19 DIAGNOSIS — R45851 Suicidal ideations: Secondary | ICD-10-CM | POA: Diagnosis not present

## 2024-02-19 DIAGNOSIS — Z560 Unemployment, unspecified: Secondary | ICD-10-CM | POA: Diagnosis not present

## 2024-02-19 DIAGNOSIS — G4700 Insomnia, unspecified: Secondary | ICD-10-CM | POA: Diagnosis not present

## 2024-02-19 MED ORDER — ADULT MULTIVITAMIN W/MINERALS CH: 1.0000 | ORAL_TABLET | Freq: Every day | ORAL | 0 refills | Status: AC

## 2024-02-19 MED ORDER — THIAMINE HCL 100 MG PO TABS
100.0000 mg | ORAL_TABLET | Freq: Every day | ORAL | 0 refills | Status: DC
  Filled 2024-02-19: qty 30, 30d supply, fill #0

## 2024-02-19 MED ORDER — NICOTINE 21 MG/24HR TD PT24
21.0000 mg | MEDICATED_PATCH | Freq: Every day | TRANSDERMAL | 0 refills | Status: AC
  Filled 2024-02-19: qty 28, 28d supply, fill #0

## 2024-02-19 MED ORDER — VITAMIN D3 25 MCG PO TABS: 2000.0000 [IU] | ORAL_TABLET | Freq: Every day | ORAL | 0 refills | Status: AC

## 2024-02-19 MED ORDER — THIAMINE HCL 100 MG PO TABS: 100.0000 mg | ORAL_TABLET | Freq: Every day | ORAL | 0 refills | Status: AC

## 2024-02-19 MED ORDER — HALOPERIDOL 5 MG PO TABS: ORAL_TABLET | ORAL | 0 refills | Status: AC
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
## 2024-02-19 NOTE — ED Notes (Signed)
 Patient presents in animated mood and denies SI,HI, and A/V/H with no plan or intent. Patient denies any pain or discomfort and is aware of pending discharge. Patient ate breakfast and remains cooperative in unit.

## 2024-02-19 NOTE — ED Notes (Signed)
 Patient is discharging at this time. Patient denies SI,HI, and A/V/H with no plan/intent. Printed AVS reviewed with and given to patient along with prescriptions and follow up appointments. Survey and suicide safety plan complete with copy provided to patient. Original form in chart. Patient verbalized all understanding. All valuables/belongings returned to patient. Patient is being transported by [facility]. Patient denies any pain/discomfort. No s/s of current distress.

## 2024-02-19 NOTE — ED Notes (Signed)
 Patient is resting quietly in bed with eyes closed, respirations even and unlabored, no distress noted, will continue to monitor for safety

## 2024-02-19 NOTE — ED Notes (Signed)
 Patient is sleeping in bed, respirations even and unlabored, no distress noted, will continue to monitor

## 2024-02-19 NOTE — Group Note (Signed)
 Group Topic: Recovery Basics  Group Date: 02/19/2024 Start Time: 1100 End Time: 1150 Facilitators: Gerome Jolly, NT  Department: [facility]  Number of Participants: 3  Group Focus: acceptance Treatment Modality:  Cognitive Behavioral Therapy Interventions utilized were exploration Purpose: explore maladaptive thinking, regain self-worth, and relapse prevention strategies  Name: Jerry [name] Date of Birth: 10-21-1975  MR: [id number]    Level of Participation: active Quality of Participation: attentive and engaged Interactions with others: appropriate Mood/Affect: appropriate Triggers (if applicable): NA Cognition: coherent/clear Progress: Moderate Response: Pt actively participated in group Plan: referral / recommendations Pt has been accepted to [facility] for inpatient treatment  Patients Problems:  Patient Active Problem List   Diagnosis Date Noted   Alcohol use disorder, severe, dependence (HCC) 02/15/2024   Cocaine use disorder (HCC) 02/15/2024   Mild benzodiazepine use disorder (HCC) 02/06/2024   Protein-calorie malnutrition, severe 07/03/2023   Left arm cellulitis 07/01/2023   Jaw pain 11/29/2022   Need for influenza vaccination 11/29/2022   Erectile dysfunction 11/29/2022   Hyponatremia 11/29/2022   History of hepatitis C virus infection 11/29/2022   Tardive dyskinesia 10/24/2022   Generalized anxiety disorder 10/24/2022   PTSD (post-traumatic stress disorder) 10/24/2022   Healthcare maintenance 10/24/2022   Schizophrenia (HCC) 07/07/2022   Malingering 09/07/2020   Homelessness 09/07/2020   Schizophrenia spectrum disorder with psychotic disorder type not yet determined (HCC) 07/17/2020   Methamphetamine abuse (HCC) 07/17/2020   Marijuana abuse 07/17/2020   Cocaine abuse (HCC) 07/17/2020   Anxiety and depression 06/25/2014   Family history of diabetes mellitus (DM) 06/25/2014   Family history of thyroid  disease  06/25/2014   Tobacco use disorder 06/25/2014   Alcohol abuse 05/18/2013   Polysubstance abuse (HCC) 05/18/2013   Depression, unspecified 05/18/2013
